# Patient Record
Sex: Female | Born: 1935
Health system: Southern US, Community
[De-identification: ages and names within clinical notes are randomized; demographics above are authoritative.]

## PROBLEM LIST (undated history)

## (undated) DIAGNOSIS — J189 Pneumonia, unspecified organism: Secondary | ICD-10-CM

## (undated) DIAGNOSIS — C8404 Mycosis fungoides, lymph nodes of axilla and upper limb: Principal | ICD-10-CM

## (undated) DIAGNOSIS — I639 Cerebral infarction, unspecified: Secondary | ICD-10-CM

## (undated) DIAGNOSIS — F411 Generalized anxiety disorder: Secondary | ICD-10-CM

## (undated) DIAGNOSIS — L409 Psoriasis, unspecified: Secondary | ICD-10-CM

## (undated) DIAGNOSIS — S78119A Complete traumatic amputation at level between unspecified hip and knee, initial encounter: Secondary | ICD-10-CM

## (undated) DIAGNOSIS — K5730 Diverticulosis of large intestine without perforation or abscess without bleeding: Secondary | ICD-10-CM

## (undated) DIAGNOSIS — I2510 Atherosclerotic heart disease of native coronary artery without angina pectoris: Secondary | ICD-10-CM

## (undated) DIAGNOSIS — R5381 Other malaise: Secondary | ICD-10-CM

## (undated) DIAGNOSIS — G609 Hereditary and idiopathic neuropathy, unspecified: Secondary | ICD-10-CM

## (undated) DIAGNOSIS — I219 Acute myocardial infarction, unspecified: Secondary | ICD-10-CM

## (undated) DIAGNOSIS — R55 Syncope and collapse: Secondary | ICD-10-CM

## (undated) DIAGNOSIS — C8448 Peripheral T-cell lymphoma, not classified, lymph nodes of multiple sites: Principal | ICD-10-CM

## (undated) DIAGNOSIS — C4491 Basal cell carcinoma of skin, unspecified: Secondary | ICD-10-CM

## (undated) DIAGNOSIS — C84A0 Cutaneous T-cell lymphoma, unspecified, unspecified site: Secondary | ICD-10-CM

## (undated) DIAGNOSIS — R0602 Shortness of breath: Secondary | ICD-10-CM

## (undated) DIAGNOSIS — I739 Peripheral vascular disease, unspecified: Secondary | ICD-10-CM

## (undated) DIAGNOSIS — E871 Hypo-osmolality and hyponatremia: Secondary | ICD-10-CM

## (undated) DIAGNOSIS — S81801A Unspecified open wound, right lower leg, initial encounter: Secondary | ICD-10-CM

## (undated) DIAGNOSIS — I6529 Occlusion and stenosis of unspecified carotid artery: Secondary | ICD-10-CM

## (undated) DIAGNOSIS — M7120 Synovial cyst of popliteal space [Baker], unspecified knee: Secondary | ICD-10-CM

## (undated) DIAGNOSIS — G459 Transient cerebral ischemic attack, unspecified: Secondary | ICD-10-CM

## (undated) DIAGNOSIS — Z8673 Personal history of transient ischemic attack (TIA), and cerebral infarction without residual deficits: Secondary | ICD-10-CM

## (undated) DIAGNOSIS — I509 Heart failure, unspecified: Secondary | ICD-10-CM

## (undated) DIAGNOSIS — Z89619 Acquired absence of unspecified leg above knee: Secondary | ICD-10-CM

## (undated) DIAGNOSIS — M25579 Pain in unspecified ankle and joints of unspecified foot: Secondary | ICD-10-CM

## (undated) DIAGNOSIS — I87339 Chronic venous hypertension (idiopathic) with ulcer and inflammation of unspecified lower extremity: Secondary | ICD-10-CM

## (undated) DIAGNOSIS — L97909 Non-pressure chronic ulcer of unspecified part of unspecified lower leg with unspecified severity: Secondary | ICD-10-CM

## (undated) DIAGNOSIS — K210 Gastro-esophageal reflux disease with esophagitis: Secondary | ICD-10-CM

## (undated) DIAGNOSIS — E876 Hypokalemia: Secondary | ICD-10-CM

## (undated) DIAGNOSIS — D7289 Other specified disorders of white blood cells: Secondary | ICD-10-CM

## (undated) DIAGNOSIS — I480 Paroxysmal atrial fibrillation: Secondary | ICD-10-CM

## (undated) DIAGNOSIS — E785 Hyperlipidemia, unspecified: Secondary | ICD-10-CM

## (undated) DIAGNOSIS — C8409 Mycosis fungoides, extranodal and solid organ sites: Secondary | ICD-10-CM

## (undated) DIAGNOSIS — I10 Essential (primary) hypertension: Secondary | ICD-10-CM

## (undated) DIAGNOSIS — D649 Anemia, unspecified: Secondary | ICD-10-CM

## (undated) DIAGNOSIS — I4891 Unspecified atrial fibrillation: Secondary | ICD-10-CM

## (undated) DIAGNOSIS — S42292A Other displaced fracture of upper end of left humerus, initial encounter for closed fracture: Secondary | ICD-10-CM

## (undated) DIAGNOSIS — K5900 Constipation, unspecified: Secondary | ICD-10-CM

## (undated) DIAGNOSIS — M2550 Pain in unspecified joint: Secondary | ICD-10-CM

## (undated) DIAGNOSIS — Z8719 Personal history of other diseases of the digestive system: Secondary | ICD-10-CM

## (undated) HISTORY — DX: Pneumonia, unspecified organism: J18.9

## (undated) HISTORY — DX: Peripheral t-cell lymphoma, not classified, lymph nodes of multiple sites: C84.48

## (undated) HISTORY — DX: Syncope and collapse: R55

## (undated) HISTORY — DX: Occlusion and stenosis of unspecified carotid artery: I65.29

## (undated) HISTORY — DX: Anemia, unspecified: D64.9

## (undated) HISTORY — PX: DILATION AND CURETTAGE OF UTERUS: SHX78

## (undated) HISTORY — DX: Peripheral vascular disease, unspecified: I73.9

## (undated) HISTORY — DX: Atherosclerotic heart disease of native coronary artery without angina pectoris: I25.10

## (undated) HISTORY — DX: Pain in unspecified ankle and joints of unspecified foot: M25.579

## (undated) HISTORY — PX: TONSILLECTOMY: SUR1361

## (undated) HISTORY — DX: Paroxysmal atrial fibrillation: I48.0

## (undated) HISTORY — PX: OTHER SURGICAL HISTORY: SHX169

## (undated) HISTORY — DX: Unspecified open wound, right lower leg, initial encounter: S81.801A

## (undated) HISTORY — DX: Complete traumatic amputation at level between unspecified hip and knee, initial encounter: S78.119A

## (undated) HISTORY — DX: Hypokalemia: E87.6

## (undated) HISTORY — DX: Pain in unspecified joint: M25.50

## (undated) HISTORY — DX: Acquired absence of unspecified leg above knee: Z89.619

## (undated) HISTORY — DX: Hereditary and idiopathic neuropathy, unspecified: G60.9

## (undated) HISTORY — DX: Non-pressure chronic ulcer of unspecified part of unspecified lower leg with unspecified severity: L97.909

## (undated) HISTORY — DX: Other specified disorders of white blood cells: D72.89

## (undated) HISTORY — DX: Mycosis fungoides, extranodal and solid organ sites: C84.09

## (undated) HISTORY — DX: Mycosis fungoides, lymph nodes of axilla and upper limb: C84.04

## (undated) HISTORY — DX: Transient cerebral ischemic attack, unspecified: G45.9

## (undated) HISTORY — DX: Other malaise: R53.81

## (undated) HISTORY — DX: Diverticulosis of large intestine without perforation or abscess without bleeding: K57.30

## (undated) HISTORY — DX: Generalized anxiety disorder: F41.1

## (undated) HISTORY — PX: EYE SURGERY: SHX253

## (undated) HISTORY — DX: Constipation, unspecified: K59.00

## (undated) HISTORY — DX: Chronic venous hypertension (idiopathic) with ulcer and inflammation of unspecified lower extremity: I87.339

## (undated) HISTORY — DX: Gastro-esophageal reflux disease with esophagitis: K21.0

## (undated) HISTORY — DX: Hypo-osmolality and hyponatremia: E87.1

## (undated) HISTORY — PX: VERTEBROPLASTY: SHX113

## (undated) HISTORY — DX: Synovial cyst of popliteal space (Baker), unspecified knee: M71.20

---

## 1998-01-31 ENCOUNTER — Ambulatory Visit (HOSPITAL_COMMUNITY): Admission: RE | Admit: 1998-01-31 | Discharge: 1998-01-31 | Payer: Self-pay | Admitting: Internal Medicine

## 2000-11-02 ENCOUNTER — Ambulatory Visit (HOSPITAL_COMMUNITY): Admission: RE | Admit: 2000-11-02 | Discharge: 2000-11-02 | Payer: Self-pay | Admitting: *Deleted

## 2000-11-02 ENCOUNTER — Encounter: Payer: Self-pay | Admitting: *Deleted

## 2001-09-14 ENCOUNTER — Emergency Department (HOSPITAL_COMMUNITY): Admission: EM | Admit: 2001-09-14 | Discharge: 2001-09-14 | Payer: Self-pay | Admitting: Emergency Medicine

## 2002-01-24 ENCOUNTER — Ambulatory Visit (HOSPITAL_COMMUNITY): Admission: RE | Admit: 2002-01-24 | Discharge: 2002-01-24 | Payer: Self-pay | Admitting: *Deleted

## 2003-01-18 ENCOUNTER — Ambulatory Visit (HOSPITAL_COMMUNITY): Admission: RE | Admit: 2003-01-18 | Discharge: 2003-01-18 | Payer: Self-pay | Admitting: *Deleted

## 2005-06-15 ENCOUNTER — Inpatient Hospital Stay (HOSPITAL_COMMUNITY): Admission: EM | Admit: 2005-06-15 | Discharge: 2005-06-18 | Payer: Self-pay | Admitting: Emergency Medicine

## 2005-06-16 ENCOUNTER — Encounter: Payer: Self-pay | Admitting: Cardiology

## 2005-06-16 ENCOUNTER — Ambulatory Visit: Payer: Self-pay | Admitting: Cardiology

## 2005-06-18 ENCOUNTER — Encounter (INDEPENDENT_AMBULATORY_CARE_PROVIDER_SITE_OTHER): Payer: Self-pay | Admitting: Cardiology

## 2006-12-09 ENCOUNTER — Inpatient Hospital Stay (HOSPITAL_COMMUNITY): Admission: EM | Admit: 2006-12-09 | Discharge: 2006-12-13 | Payer: Self-pay | Admitting: Emergency Medicine

## 2006-12-11 ENCOUNTER — Encounter (INDEPENDENT_AMBULATORY_CARE_PROVIDER_SITE_OTHER): Payer: Self-pay | Admitting: Cardiology

## 2006-12-17 ENCOUNTER — Emergency Department (HOSPITAL_COMMUNITY): Admission: EM | Admit: 2006-12-17 | Discharge: 2006-12-17 | Payer: Self-pay | Admitting: Emergency Medicine

## 2007-05-24 ENCOUNTER — Encounter: Admission: RE | Admit: 2007-05-24 | Discharge: 2007-06-21 | Payer: Self-pay | Admitting: Internal Medicine

## 2007-12-20 ENCOUNTER — Encounter (INDEPENDENT_AMBULATORY_CARE_PROVIDER_SITE_OTHER): Payer: Self-pay | Admitting: *Deleted

## 2007-12-20 ENCOUNTER — Inpatient Hospital Stay (HOSPITAL_COMMUNITY): Admission: EM | Admit: 2007-12-20 | Discharge: 2007-12-22 | Payer: Self-pay | Admitting: Emergency Medicine

## 2007-12-20 ENCOUNTER — Ambulatory Visit: Payer: Self-pay | Admitting: Vascular Surgery

## 2007-12-21 ENCOUNTER — Encounter (INDEPENDENT_AMBULATORY_CARE_PROVIDER_SITE_OTHER): Payer: Self-pay | Admitting: *Deleted

## 2008-04-09 ENCOUNTER — Encounter: Admission: RE | Admit: 2008-04-09 | Discharge: 2008-06-05 | Payer: Self-pay | Admitting: Internal Medicine

## 2008-06-28 ENCOUNTER — Encounter: Admission: RE | Admit: 2008-06-28 | Discharge: 2008-06-28 | Payer: Self-pay | Admitting: Internal Medicine

## 2008-07-11 ENCOUNTER — Inpatient Hospital Stay (HOSPITAL_COMMUNITY): Admission: RE | Admit: 2008-07-11 | Discharge: 2008-07-12 | Payer: Self-pay | Admitting: Neurological Surgery

## 2008-07-11 ENCOUNTER — Encounter (INDEPENDENT_AMBULATORY_CARE_PROVIDER_SITE_OTHER): Payer: Self-pay | Admitting: Neurological Surgery

## 2008-09-18 ENCOUNTER — Ambulatory Visit: Payer: Self-pay | Admitting: Vascular Surgery

## 2009-11-14 ENCOUNTER — Emergency Department (HOSPITAL_COMMUNITY)
Admission: EM | Admit: 2009-11-14 | Discharge: 2009-11-15 | Payer: Self-pay | Source: Home / Self Care | Admitting: Emergency Medicine

## 2009-11-27 ENCOUNTER — Inpatient Hospital Stay (HOSPITAL_COMMUNITY): Admission: EM | Admit: 2009-11-27 | Discharge: 2009-11-28 | Payer: Self-pay | Admitting: Emergency Medicine

## 2009-11-28 ENCOUNTER — Encounter (INDEPENDENT_AMBULATORY_CARE_PROVIDER_SITE_OTHER): Payer: Self-pay | Admitting: Cardiovascular Disease

## 2009-11-28 ENCOUNTER — Ambulatory Visit: Payer: Self-pay | Admitting: Vascular Surgery

## 2010-01-24 ENCOUNTER — Emergency Department (HOSPITAL_COMMUNITY): Admission: EM | Admit: 2010-01-24 | Discharge: 2010-01-24 | Payer: Self-pay | Admitting: Family Medicine

## 2010-01-24 ENCOUNTER — Ambulatory Visit: Payer: Self-pay | Admitting: Surgery

## 2010-01-24 ENCOUNTER — Ambulatory Visit
Admission: RE | Admit: 2010-01-24 | Discharge: 2010-01-24 | Payer: Self-pay | Source: Home / Self Care | Admitting: Emergency Medicine

## 2010-04-15 ENCOUNTER — Emergency Department (HOSPITAL_COMMUNITY): Admission: EM | Admit: 2010-04-15 | Discharge: 2010-04-15 | Payer: Self-pay | Admitting: Family Medicine

## 2010-10-20 ENCOUNTER — Other Ambulatory Visit: Payer: Self-pay | Admitting: Family Medicine

## 2010-10-20 ENCOUNTER — Ambulatory Visit
Admission: RE | Admit: 2010-10-20 | Discharge: 2010-10-20 | Disposition: A | Discharge status: Home / Self Care | Payer: Self-pay | Source: Ambulatory Visit | Attending: Family Medicine | Admitting: Family Medicine

## 2010-10-20 DIAGNOSIS — Z139 Encounter for screening, unspecified: Secondary | ICD-10-CM

## 2010-11-06 ENCOUNTER — Inpatient Hospital Stay (HOSPITAL_COMMUNITY)
Admission: AD | Admit: 2010-11-06 | Discharge: 2010-11-11 | DRG: 281 | Disposition: A | Discharge status: Nursing Home (Includes ICF) | Payer: Medicare Other | Source: Ambulatory Visit | Attending: Cardiovascular Disease | Admitting: Cardiovascular Disease

## 2010-11-06 ENCOUNTER — Inpatient Hospital Stay (HOSPITAL_COMMUNITY): Payer: Medicare Other

## 2010-11-06 DIAGNOSIS — I10 Essential (primary) hypertension: Secondary | ICD-10-CM | POA: Diagnosis present

## 2010-11-06 DIAGNOSIS — Z8673 Personal history of transient ischemic attack (TIA), and cerebral infarction without residual deficits: Secondary | ICD-10-CM

## 2010-11-06 DIAGNOSIS — I6529 Occlusion and stenosis of unspecified carotid artery: Secondary | ICD-10-CM | POA: Diagnosis present

## 2010-11-06 DIAGNOSIS — I359 Nonrheumatic aortic valve disorder, unspecified: Secondary | ICD-10-CM | POA: Diagnosis present

## 2010-11-06 DIAGNOSIS — E785 Hyperlipidemia, unspecified: Secondary | ICD-10-CM | POA: Diagnosis present

## 2010-11-06 DIAGNOSIS — D72829 Elevated white blood cell count, unspecified: Secondary | ICD-10-CM | POA: Diagnosis present

## 2010-11-06 DIAGNOSIS — L408 Other psoriasis: Secondary | ICD-10-CM | POA: Diagnosis present

## 2010-11-06 DIAGNOSIS — I5032 Chronic diastolic (congestive) heart failure: Secondary | ICD-10-CM | POA: Diagnosis present

## 2010-11-06 DIAGNOSIS — I214 Non-ST elevation (NSTEMI) myocardial infarction: Principal | ICD-10-CM | POA: Diagnosis present

## 2010-11-06 DIAGNOSIS — Z7982 Long term (current) use of aspirin: Secondary | ICD-10-CM

## 2010-11-06 DIAGNOSIS — I509 Heart failure, unspecified: Secondary | ICD-10-CM | POA: Diagnosis present

## 2010-11-06 DIAGNOSIS — I2510 Atherosclerotic heart disease of native coronary artery without angina pectoris: Secondary | ICD-10-CM | POA: Diagnosis present

## 2010-11-06 DIAGNOSIS — I4891 Unspecified atrial fibrillation: Secondary | ICD-10-CM | POA: Diagnosis present

## 2010-11-06 HISTORY — PX: CARDIAC CATHETERIZATION: SHX172

## 2010-11-06 LAB — COMPREHENSIVE METABOLIC PANEL
ALT: 28 U/L (ref 0–35)
AST: 30 U/L (ref 0–37)
CO2: 32 mEq/L (ref 19–32)
GFR calc Af Amer: >60 mL/min (ref >60)
Potassium: 3.8 mEq/L (ref 3.5–5.1)
Total Bilirubin: 0.4 mg/dL (ref 0.3–1.2)

## 2010-11-06 LAB — PROTIME-INR
INR: 1.03 (ref 0.00–1.49)
Prothrombin Time: 13.7 seconds (ref 11.6–15.2)

## 2010-11-06 LAB — CBC
HCT: 37.7 % (ref 36.0–46.0)
RDW: 12.9 % (ref 11.5–15.5)
WBC: 18.1 10*3/uL — ABNORMAL HIGH (ref 4.0–10.5)

## 2010-11-06 LAB — CARDIAC PANEL(CRET KIN+CKTOT+MB+TROPI)
Total CK: 127 U/L (ref 7–177)
Troponin I: 0.14 ng/mL — ABNORMAL HIGH (ref 0.00–0.06)

## 2010-11-06 LAB — APTT: aPTT: 26 seconds (ref 24–37)

## 2010-11-07 LAB — CBC
HCT: 34 % — ABNORMAL LOW (ref 36.0–46.0)
Hemoglobin: 11.5 g/dL — ABNORMAL LOW (ref 12.0–15.0)
RBC: 3.64 MIL/uL — ABNORMAL LOW (ref 3.87–5.11)
WBC: 18.7 10*3/uL — ABNORMAL HIGH (ref 4.0–10.5)

## 2010-11-07 LAB — HEPARIN LEVEL (UNFRACTIONATED): Heparin Unfractionated: 0.17 IU/mL — ABNORMAL LOW (ref 0.30–0.70)

## 2010-11-07 LAB — LIPID PANEL
Total CHOL/HDL Ratio: 3.9 RATIO
VLDL: 18 mg/dL (ref 0–40)

## 2010-11-07 LAB — BASIC METABOLIC PANEL
GFR calc Af Amer: >60 mL/min (ref >60)
GFR calc non Af Amer: 50 mL/min — ABNORMAL LOW (ref >60)
Potassium: 3.8 mEq/L (ref 3.5–5.1)
Sodium: 137 mEq/L (ref 135–145)

## 2010-11-07 LAB — CARDIAC PANEL(CRET KIN+CKTOT+MB+TROPI)
CK, MB: 4.7 ng/mL — ABNORMAL HIGH (ref 0.3–4.0)
CK, MB: 3.4 ng/mL (ref 0.3–4.0)
Total CK: 114 U/L (ref 7–177)
Total CK: 91 U/L (ref 7–177)
Troponin I: 0.31 ng/mL — ABNORMAL HIGH (ref 0.00–0.06)

## 2010-11-07 LAB — BRAIN NATRIURETIC PEPTIDE: Pro B Natriuretic peptide (BNP): 522 pg/mL — ABNORMAL HIGH (ref 0.0–100.0)

## 2010-11-08 LAB — CBC
Hemoglobin: 11.8 g/dL — ABNORMAL LOW (ref 12.0–15.0)
MCH: 30.4 pg (ref 26.0–34.0)
MCHC: 32.6 g/dL (ref 30.0–36.0)
MCV: 93.3 fL (ref 78.0–100.0)
Platelets: 316 10*3/uL (ref 150–400)

## 2010-11-08 LAB — HEPARIN LEVEL (UNFRACTIONATED): Heparin Unfractionated: 0.52 IU/mL (ref 0.30–0.70)

## 2010-11-09 LAB — CBC
Hemoglobin: 12.6 g/dL (ref 12.0–15.0)
MCH: 31.2 pg (ref 26.0–34.0)
MCHC: 33.5 g/dL (ref 30.0–36.0)
MCV: 93.1 fL (ref 78.0–100.0)

## 2010-11-09 LAB — HEPARIN LEVEL (UNFRACTIONATED): Heparin Unfractionated: 0.27 IU/mL — ABNORMAL LOW (ref 0.30–0.70)

## 2010-11-10 DIAGNOSIS — I2510 Atherosclerotic heart disease of native coronary artery without angina pectoris: Secondary | ICD-10-CM

## 2010-11-10 LAB — BASIC METABOLIC PANEL
BUN: 13 mg/dL (ref 6–23)
CO2: 27 mEq/L (ref 19–32)
Calcium: 9.2 mg/dL (ref 8.4–10.5)
Creatinine, Ser: 0.79 mg/dL (ref 0.4–1.2)
GFR calc non Af Amer: >60 mL/min (ref >60)
Glucose, Bld: 88 mg/dL (ref 70–99)
Sodium: 141 mEq/L (ref 135–145)

## 2010-11-10 LAB — CBC
HCT: 37.4 % (ref 36.0–46.0)
MCH: 30.8 pg (ref 26.0–34.0)
MCH: 31.5 pg (ref 26.0–34.0)
MCHC: 33.2 g/dL (ref 30.0–36.0)
MCHC: 33.4 g/dL (ref 30.0–36.0)
MCV: 92.8 fL (ref 78.0–100.0)
MCV: 94.4 fL (ref 78.0–100.0)
Platelets: 323 10*3/uL (ref 150–400)
RDW: 12.7 % (ref 11.5–15.5)
RDW: 12.7 % (ref 11.5–15.5)

## 2010-11-10 LAB — HEPARIN LEVEL (UNFRACTIONATED): Heparin Unfractionated: 0.84 IU/mL — ABNORMAL HIGH (ref 0.30–0.70)

## 2010-11-11 LAB — CBC
HCT: 37.5 % (ref 36.0–46.0)
Hemoglobin: 12.3 g/dL (ref 12.0–15.0)
MCHC: 32.8 g/dL (ref 30.0–36.0)
MCV: 93.3 fL (ref 78.0–100.0)
RDW: 12.9 % (ref 11.5–15.5)

## 2010-11-13 NOTE — Op Note (Signed)
NAME:  Sara [name], Sara [name]                  ACCOUNT NO.:  [id number]  MEDICAL RECORD NO.:  [id number]           PATIENT TYPE:  I  LOCATION:  2010                         FACILITY:  [facility]  PHYSICIAN:  Thurmon Fair, MD     DATE OF BIRTH:  [birth year]  DATE OF PROCEDURE: DATE OF DISCHARGE:                              OPERATIVE REPORT   PROCEDURES PERFORMED: 1. Left heart catheterization. 2. Selective coronary angiography. 3. Left ventriculography.  REASON FOR PROCEDURE:  Unstable angina/small non-ST-segment elevation myocardial infarction.  After the risks and benefits of the procedure described, the patient provided informed consent, was brought to the cardiac cath lab in the fasting state.  Local anesthesia 1% lidocaine was administered to the right groin area and a 5-French right common femoral arterial sheath was introduced using the modified Seldinger technique with little difficulty.  Using 5-French JL-4 and JR catheters, the right and left coronary arteries were respectively cannulated and multiple selective angiograms were performed.  The JR catheter was also used to cross the aortic valve and perform a hand injection and left ventriculogram.  At the end of procedure, the catheters were removed and the sheath will be removed using manual pressure.  No immediate complications occurred.  FINDINGS: 1. The ascending aorta, aortic arch, and descending thoracic aorta     show extensive atherosclerosis and calcification. 2. The left coronary artery is heavily calcified but free of stenoses. 3. The left anterior descending coronary artery is heavily calcified     throughout its proximal two thirds and has diffuse disease.  There     is approximately 50% proximal lesion.  There is a very long segment     of at least 20 mL of luminal irregularities and severe irregular     plaque, severe heterogeneous plaque with a maximum severity of     about 80%.  This is located just beyond the  takeoff of the only     significant diagonal artery, which is a fairly small vessel.  The     ostial diagonal shows approximately 80% stenosis.  The distal LAD     artery is again diffusely diseased but it is a very long vessel     that wraps around the apex. 4. The left circumflex coronary artery is a very large albeit     nondominant vessel.  It has extensive calcification and severe     diffuse ulceration.  It gives off 2 major oblique marginal artery     and then ends with a small posterolateral ventricular artery.  The     first oblique marginal artery shows an approximately 90% to 95%     ostial stenosis.  The second oblique marginal artery which is     slightly larger has a long area of irregularity with a maximum     diameter stenosis of about 70%.  The distal AV groove vessel beyond     the OM2 have an approximately 50-60% narrowing. 5. The right coronary artery is a dominant but small-to-medium size     vessel.  There is 60-70% ostial and proximal vessel stenosis  and an     80% discrete mid vessel stenosis. 6. The left ventricle shows left ventricular hypertrophy and     hyperdynamic ejection fraction of greater than 70% without any     regional wall motion abnormalities.  The end-diastolic pressure is     26 mmHg.  There is mild aortic stenosis with peak gradient of about     15 mmHg.  There is heavy calcification in the mitral annulus.  CONCLUSION:  [name] has severe three-vessel coronary artery disease and preserved left ventricular systolic function.  I believe she would be served with coronary bypass surgery due to the extensive and diffuse nature of her coronary problem.  However, her age and the extensive atherosclerosis of the ascending aorta and aortic arch make her less than optimal candidate for bypass surgery.  Thoracic surgery consultation will be obtained but consideration might be given to incomplete revascularization with percutaneous techniques.  The  most important lesions appeared to be the severe ostial stenosis of the OM1 vessel, but also the long segment disease in the LAD artery.  The latter would be hard to tackle with a percutaneous intervention due to the extensive calcification of the vessel.  However, it seems to best fit the reported area of ischemia described in a previous stress test.     Thurmon Fair, MD     MC/MEDQ  D:  11/09/2010  T:  11/10/2010  Job:  [id number]  cc:   [facility] Cardioth[facility]acic Surgery  Electronically Signed by Thurmon Fair M.D. on 11/13/2010 12:51:38 PM

## 2010-11-19 NOTE — Consult Note (Signed)
NAME:  Sara [name], Narula                  ACCOUNT NO.:  [id number]  MEDICAL RECORD NO.:  [id number]           PATIENT TYPE:  I  LOCATION:  2010                         FACILITY:  [facility]  PHYSICIAN:  Evelene Croon, M.D.     DATE OF BIRTH:  Aug 12, 1919  DATE OF CONSULTATION:  11/10/2010 DATE OF DISCHARGE:                                CONSULTATION   REFERRING PHYSICIAN:  Nanetta Batty, MD  REASON FOR CONSULTATION:  Severe multivessel coronary artery disease.  CLINICAL HISTORY:  I was asked by Dr. Allyson Sabal to evaluate [name] for consideration of coronary artery bypass graft surgery.  She is an 77- year-old woman with a history of hypertension, hyperlipidemia, history of severe debilitating psoriasis as well as a history of prior stroke and known high-grade left internal carotid artery stenosis who was admitted with new onset substernal chest discomfort that awoke her from sleep about 2 a.m.  She had positive troponin of 0.14 on admission with a CPK of 127 and MB of 5.5.  Her troponin increased to 0.37.  Her second CPK-MB was 4.7.  She had a known history of coronary artery disease and had a positive nuclear scan apparently in 2010.  She was treated medically.  She underwent cardiac catheterization on November 09, 2010, which showed three-vessel coronary artery disease.  The LAD was heavily calcified throughout the proximal two thirds with diffuse disease. There was about 50% proximal stenosis.  There was a long segment stenosis with about 80% maximum stenosis within this region.  This was located just beyond the takeoff of the diagonal artery.  The ostium of the diagonal branch had about 80% stenosis.  This was relatively small vessel.  The distal LAD was diffusely diseased, but wrapped around the apex.  Left circumflex was large, nondominant vessel that was also calcified and severely diseased.  I gave off two major obtuse marginal branches and ended with a small posterolateral branch.  The  first obtuse marginal had about 90-95% ostial stenosis.  The second marginal had about 70% stenosis.  The right coronary artery was a dominant vessel. It had 60-70% ostial and proximal stenosis and 80% midvessel stenosis. Left ventricular ejection fraction was about 70% with no regional wall motion abnormalities.  There was mild aortic stenosis with a peak gradient of about 15 mmHg.  There was heavy mitral annular calcification, but no significant mitral regurgitation.  REVIEW OF SYSTEMS:  Her review of systems is as follows:  GENERAL:  She denies any fever or chills.  She denies any recent weight changes.  She does report some fatigue.  EYES:  Negative.  ENT:  Negative.  ENDOCRINE: She denies diabetes and hypothyroidism.  CARDIOVASCULAR:  As above.  She denies exertional dyspnea.  She has had no PND or orthopnea.  She denies peripheral edema and palpitations.  She does have a history of paroxysmal atrial fibrillation dating back to 2008, but has not been on anticoagulation.  RESPIRATORY:  She denies cough and sputum production. GI:  She has had no nausea or vomiting.  She denies melena and bright red blood per rectum.  GU:  She denies dysuria and hematuria. MUSCULOSKELETAL:  She does have history of compression fractures in the back secondary to osteoporosis.  VASCULAR:  She denies claudication and phlebitis.  She does have a history of a high-grade left internal carotid artery stenosis and has had previous stroke x2.  She denies any focal weakness or numbness.  SKIN:  She has history of psoriasis for several years and having manage on methotrexate and anti-inflammatory with fair control, but these were recently stopped.  So that she could start on a study drug and she has had an acute flare for psoriasis that is her main complaint at this time.  PSYCHIATRIC:  Negative. HEMATOLOGICAL:  Negative.  ALLERGIES:  She is allergic to PENICILLIN, EVISTA, FOSAMAX, TRIAMCINOLONE, and  TRIAMTERENE.  PAST MEDICAL HISTORY:  Significant for: 1. Hypertension. 2. Hyperlipidemia. 3. History of left internal carotid artery stenosis followed by     vascular surgeons.  Surgery was recommended in 2010, but she     declined. 4. She has a history of a right occipital stroke in 2006 and right     thalamic infarct in 2009. 5. She has a history of paroxysmal atrial fibrillation. 6. She has a history of diastolic congestive heart failure. 7. She is status post tonsillectomy and status post D and C.  Her medications are as noted on her medicine reconciliation form.  SOCIAL HISTORY:  She is widowed and has two children.  Her son, Conni Elliot is a Solicitor.  She does not smoke or drink alcohol.  FAMILY HISTORY:  Negative.  PHYSICAL EXAMINATION:  VITAL SIGNS:  She is afebrile.  Blood pressure 176/63, pulse 72 and regular, respiratory rate is 18 and unlabored. Oxygen saturation on room air is 94%. GENERAL:  She is a well-developed elderly white female in no distress who is scratching her extremities due to psoriasis. HEENT:  Normocephalic and atraumatic.  Pupils are equal and reactive to light.  Extraocular muscles are intact.  Oropharynx is clear. NECK:  Normal carotid pulses bilaterally.  There are bilateral cervical bruits.  There is no adenopathy or thyromegaly. LUNGS:  Clear. CARDIAC:  Regular rate and rhythm with normal S1 and S2.  There is a grade 1-2/6 systolic murmur over the aorta. ABDOMEN:  Active bowel sounds.  Abdomen is soft and nontender.  There are no palpable masses or organomegaly. EXTREMITIES:  No peripheral edema.  Pedal pulses are palpable bilaterally. SKIN:  Diffuse erythema with a scaling rash all over her body.  This is not appeared to spare any area. NEUROLOGIC:  Alert and oriented x3.  Motor and sensory exams are grossly norma.  LABORATORY EXAMINATION:  White blood cell count is 20.7, hemoglobin 13.5, hematocrit is 40.5, platelet count 323,000.   Electrolytes are normal with BUN of 13, creatinine of 0.79.  Her BNP was elevated at 522. Liver function profile is within normal limits with an albumin of 3.7. Chest x-ray shows chronic mild cardiomegaly and extensive atherosclerosis particularly at the aortic arch.  There is pulmonary hyperinflation.  IMPRESSION:  Ms. Swickard has severe three-vessel coronary artery disease with unstable anginal symptoms.  She presented with a small non-ST segment elevation myocardial infarction.  Her risk for coronary artery bypass graft surgery is high given her age of 40 with a calcified aorta as well as a history of prior strokes and high-grade left internal carotid artery stenosis.  She is also at increased risk for nonhealing wounds and wound infection given her diffuse flare-up of psoriasis, which involves her entire chest  as well as rest to her body.  Her white blood cell count is already elevated to 20.7.  It is not clear to me if she has received any medications such as steroids that may elevate that. She has had no fever as far as I can see.  Given all of the above, I would recommend percutaneous intervention if that is an option.  If that is not an option, then I would consider performing high-risk coronary artery bypass graft surgery.  I do not think we can expect to perform her surgery off-pump given her diffuse disease and calcified vessels which would make it very difficult to do.  I discussed all of this with her and told her that I would be happy discussed with her son- in-law who is a physician.  I will discuss the case further with Dr. Allyson Sabal to decide whether percutaneous intervention is even an option.     Evelene Croon, M.D.     BB/MEDQ  D:  11/10/2010  T:  11/11/2010  Job:  [id number]  cc:   Nanetta Batty, M.D.  Electronically Signed by Evelene Croon M.D. on 11/18/2010 11:31:40 AM

## 2010-11-25 NOTE — Discharge Summary (Signed)
NAME:  Sara [name], Sara [name]                  ACCOUNT NO.:  [id number]  MEDICAL RECORD NO.:  [id number]           PATIENT TYPE:  I  LOCATION:  2010                         FACILITY:  [facility]  PHYSICIAN:  Nanetta Batty, M.D.   DATE OF BIRTH:  1919/04/29  DATE OF ADMISSION:  11/06/2010 DATE OF DISCHARGE:  11/11/2010                              DISCHARGE SUMMARY   DISCHARGE DIAGNOSES: 1. Status post left heart catheterization on November 09, 2010.  The     patient has severe three-vessel coronary artery disease with     preserved left ventricular function.  She is high risk for coronary     artery bypass grafting. 2. Psoriasis.  The patient takes a study drug, she is due for her     third dose this week. 3. Paroxysmal atrial fibrillation, now in sinus rhythm. 4. Leukocytosis. 5. Grade 2 diastolic dysfunction. 6. Hypertension.  HOSPITAL COURSE:  [name] is an 77 year old Caucasian female with history of paroxysmal atrial fibrillation, hypertension, hyperlipidemia, high-grade left internal carotid artery stenosis by duplex, and psoriasis, currently taking a study medication.  She woke at 2 a.m. with chest pain the day of admission, was found to be in atrial fibrillation as well with a controlled ventricular rate.  She had a Myoview stress test in March 2011, which showed mild-to-moderate anterior ischemia and at that time medical management was recommended.  Upon arrival to [facility], the patient was found to be in normal sinus rhythm, she was started on IV heparin, admits to telemetry scheduled for cardiac catheterization on Monday, November 09, 2010.  Her cardiac enzymes were elevated, peak troponin was 0.37, peak CK-MB was 5.5 and she had a BNP of 522.  The patient continued without chest pain or shortness of breath.  Her cardiac catheterization on November 09, 2010, revealed severe three-vessel coronary artery disease with a preserved left ventricular systolic function.  A CTS Surgery  consult was requested due to severely calcified aortic arch, calcified left main as well as psoriasis, so that the patient is high risk for coronary artery bypass grafting.  Peak blood pressure was elevated at 176/63 on November 10, 2010.  She was subsequently started on BiDil 37.5 mg twice daily. Subsequent blood pressure was checked on November 11, 2010 was 116/46. The patient continued without chest pain or shortness of breath, though she was complaining of itchiness for psoriasis.  At this time, the patient has been seen by Dr. Allyson Sabal on February 29, 2011, feels the patient is stable for discharge.  DISCHARGE LABORATORY DATA:  WBCs 17.8, hemoglobin 12.3, hematocrit 37.5, platelets 297.  Sodium 141, potassium 4.0, chloride 105, carbon dioxide 27, glucose 88, BUN 13, creatinine 0.79, calcium 9.2.  Total cholesterol was 143, triglycerides 91, HDL 39, LDL 96.  STUDIES/PROCEDURES: 1. Chest x-ray on November 06, 2010.  Impression:  Pulmonary     hyperinflation, chronic mild cardiomegaly and extensive     atherosclerosis.  No acute cardiopulmonary abnormality. 2. Cardiac catheterization on November 09, 2010.  Conclusion:  Ms.     Burr has severe three-vessel coronary artery disease and preserved  left ventricular systolic function.  I believe she would be best     served by coronary artery bypass surgery due to extensive and     diffuse nature of her coronary problems.  However, her age and     extensive atherosclerosis of the descending aorta and aortic arch     makes her less than optimal candidate for bypass surgery.  Thoracic     Surgery consult was obtained.  The most important lesion appeared     to be the severe ostial stenosis of the OM1 vessel, also long     segment disease in the LAD artery.  In latter, it will be hard to     tack over the percutaneous intervention due to the extensive     calcification of the vessel.  However, seems to best fit in the     reported area of  ischemia described in previous stress test.  DISCHARGE MEDICATIONS: 1. Acetaminophen 325 mg two tablets by mouth every 4 hours as needed     for pain. 2. Aspirin enteric-coated 325 mg one tablet by mouth daily. 3. BiDil 20/37.5 mg one tablet by mouth twice daily. 4. Nitroglycerin sublingual 0.4 mg tablets one tablet under the tongue     every 5 minutes for chest pain up to three doses total. 5. Amlodipine 10 mg one tablet by mouth daily. 6. Calcium over-the-counter one tablet by mouth daily. 7. Carvedilol 6.25 mg one tablet by mouth twice daily. 8. Coenzymes Q10 vitamin two tablets by mouth daily. 9. Furosemide 40 mg one tablet by mouth daily. 10.Fish oil over-the-counter two capsules by mouth daily at bedtime. 11.Lisinopril 40 mg one tablet by mouth daily. 12.Metolazone 2.5 mg one tablet by mouth every other day. 13.Magnesium over-the-counter two tablets by mouth daily. 14.Potassium chloride 20 mEq one tablet by mouth daily. 15.Plavix 75 mg one tablet by mouth daily.  DISPOSITION:  He is ready to be discharge to [facility] in stable condition.  She is to increase her activity slowly, may shower and bathe.  No lifting for 2 days.  She is to eat a low-sodium, heart-healthy diet.  If her catheter site becomes red, painful swollen, or discharges fluid or pus, she is to call our office at [phone].  She will follow up with Dr. Allyson Sabal in approximately 2 weeks on November 19, 2010 at 9:45 a.m.    ______________________________ Wilburt Finlay, PA   ______________________________ Nanetta Batty, M.D.    BH/MEDQ  D:  11/11/2010  T:  11/12/2010  Job:  [id number]  Electronically Signed by Wilburt Finlay PA on 11/23/2010 04:45:09 PM Electronically Signed by Nanetta Batty M.D. on 11/25/2010 01:59:06 PM

## 2010-12-01 LAB — DIFFERENTIAL
Basophils Relative: 0 % (ref 0–1)
Eosinophils Absolute: 0.1 10*3/uL (ref 0.0–0.7)
Eosinophils Relative: 1 % (ref 0–5)
Lymphs Abs: 2.9 10*3/uL (ref 0.7–4.0)
Monocytes Relative: 8 % (ref 3–12)
Neutrophils Relative %: 62 % (ref 43–77)

## 2010-12-01 LAB — CBC
HCT: 35.3 % — ABNORMAL LOW (ref 36.0–46.0)
MCHC: 34.4 g/dL (ref 30.0–36.0)
MCV: 92.7 fL (ref 78.0–100.0)
Platelets: 243 10*3/uL (ref 150–400)
WBC: 10 10*3/uL (ref 4.0–10.5)

## 2010-12-01 LAB — POCT I-STAT, CHEM 8
Creatinine, Ser: 0.9 mg/dL (ref 0.4–1.2)
Glucose, Bld: 99 mg/dL (ref 70–99)
Hemoglobin: 12.6 g/dL (ref 12.0–15.0)
Potassium: 4.1 mEq/L (ref 3.5–5.1)

## 2010-12-01 LAB — POCT URINALYSIS DIP (DEVICE)
Ketones, ur: NEGATIVE mg/dL
Protein, ur: NEGATIVE mg/dL
pH: 6.5 (ref 5.0–8.0)

## 2010-12-06 LAB — BASIC METABOLIC PANEL
CO2: 28 mEq/L (ref 19–32)
Chloride: 104 mEq/L (ref 96–112)
Creatinine, Ser: 0.9 mg/dL (ref 0.4–1.2)
GFR calc Af Amer: >60 mL/min (ref >60)
Potassium: 3.7 mEq/L (ref 3.5–5.1)
Sodium: 138 mEq/L (ref 135–145)

## 2010-12-06 LAB — CARDIAC PANEL(CRET KIN+CKTOT+MB+TROPI)
CK, MB: 3.2 ng/mL (ref 0.3–4.0)
CK, MB: 3.6 ng/mL (ref 0.3–4.0)
Relative Index: INVALID (ref 0.0–2.5)
Total CK: 67 U/L (ref 7–177)
Troponin I: 0.02 ng/mL (ref 0.00–0.06)
Troponin I: 0.22 ng/mL — ABNORMAL HIGH (ref 0.00–0.06)

## 2010-12-06 LAB — CBC
HCT: 38.9 % (ref 36.0–46.0)
Hemoglobin: 13.1 g/dL (ref 12.0–15.0)
MCV: 93.2 fL (ref 78.0–100.0)
MCV: 93.7 fL (ref 78.0–100.0)
Platelets: 214 10*3/uL (ref 150–400)
RBC: 3.89 MIL/uL (ref 3.87–5.11)
RBC: 4.17 MIL/uL (ref 3.87–5.11)
WBC: 12.5 10*3/uL — ABNORMAL HIGH (ref 4.0–10.5)
WBC: 14.1 10*3/uL — ABNORMAL HIGH (ref 4.0–10.5)

## 2010-12-06 LAB — COMPREHENSIVE METABOLIC PANEL
Alkaline Phosphatase: 42 U/L (ref 39–117)
BUN: 17 mg/dL (ref 6–23)
CO2: 30 mEq/L (ref 19–32)
Chloride: 98 mEq/L (ref 96–112)
Creatinine, Ser: 1.07 mg/dL (ref 0.4–1.2)
GFR calc non Af Amer: 48 mL/min — ABNORMAL LOW (ref >60)
Glucose, Bld: 124 mg/dL — ABNORMAL HIGH (ref 70–99)
Total Bilirubin: 0.6 mg/dL (ref 0.3–1.2)

## 2010-12-06 LAB — URINALYSIS, ROUTINE W REFLEX MICROSCOPIC
Ketones, ur: NEGATIVE mg/dL
Nitrite: NEGATIVE
Specific Gravity, Urine: 1.019 (ref 1.005–1.030)
Urobilinogen, UA: 0.2 mg/dL (ref 0.0–1.0)
pH: 7.5 (ref 5.0–8.0)

## 2010-12-06 LAB — TROPONIN I: Troponin I: 0.05 ng/mL (ref 0.00–0.06)

## 2010-12-06 LAB — PROTIME-INR
INR: 1.19 (ref 0.00–1.49)
Prothrombin Time: 15 seconds (ref 11.6–15.2)

## 2010-12-06 LAB — HEMOGLOBIN A1C: Hgb A1c MFr Bld: 5.9 % (ref 4.6–6.1)

## 2010-12-06 LAB — DIFFERENTIAL
Basophils Absolute: 0 10*3/uL (ref 0.0–0.1)
Basophils Relative: 0 % (ref 0–1)
Lymphocytes Relative: 17 % (ref 12–46)
Neutro Abs: 11.1 10*3/uL — ABNORMAL HIGH (ref 1.7–7.7)

## 2010-12-06 LAB — URINE CULTURE: Colony Count: 25000

## 2010-12-06 LAB — URINE MICROSCOPIC-ADD ON

## 2010-12-06 LAB — CK TOTAL AND CKMB (NOT AT ARMC): Relative Index: INVALID (ref 0.0–2.5)

## 2010-12-06 LAB — MAGNESIUM: Magnesium: 2.2 mg/dL (ref 1.5–2.5)

## 2010-12-06 LAB — POCT CARDIAC MARKERS: Myoglobin, poc: 94.8 ng/mL (ref 12–200)

## 2010-12-27 ENCOUNTER — Other Ambulatory Visit: Payer: Self-pay | Admitting: Emergency Medicine

## 2010-12-27 ENCOUNTER — Emergency Department (HOSPITAL_COMMUNITY): Payer: Medicare Other

## 2010-12-27 ENCOUNTER — Inpatient Hospital Stay (HOSPITAL_COMMUNITY)
Admission: EM | Admit: 2010-12-27 | Discharge: 2010-12-29 | DRG: 309 | Disposition: A | Discharge status: Home / Self Care | Payer: Medicare Other | Attending: Cardiovascular Disease | Admitting: Cardiovascular Disease

## 2010-12-27 DIAGNOSIS — I4891 Unspecified atrial fibrillation: Principal | ICD-10-CM | POA: Diagnosis present

## 2010-12-27 DIAGNOSIS — I6529 Occlusion and stenosis of unspecified carotid artery: Secondary | ICD-10-CM | POA: Diagnosis present

## 2010-12-27 DIAGNOSIS — I200 Unstable angina: Secondary | ICD-10-CM | POA: Diagnosis present

## 2010-12-27 DIAGNOSIS — D72829 Elevated white blood cell count, unspecified: Secondary | ICD-10-CM | POA: Diagnosis present

## 2010-12-27 DIAGNOSIS — I10 Essential (primary) hypertension: Secondary | ICD-10-CM | POA: Diagnosis present

## 2010-12-27 DIAGNOSIS — Z8673 Personal history of transient ischemic attack (TIA), and cerebral infarction without residual deficits: Secondary | ICD-10-CM

## 2010-12-27 DIAGNOSIS — I059 Rheumatic mitral valve disease, unspecified: Secondary | ICD-10-CM | POA: Diagnosis present

## 2010-12-27 DIAGNOSIS — I2510 Atherosclerotic heart disease of native coronary artery without angina pectoris: Secondary | ICD-10-CM | POA: Diagnosis present

## 2010-12-27 DIAGNOSIS — E785 Hyperlipidemia, unspecified: Secondary | ICD-10-CM | POA: Diagnosis present

## 2010-12-27 LAB — COMPREHENSIVE METABOLIC PANEL
Alkaline Phosphatase: 41 U/L (ref 39–117)
BUN: 13 mg/dL (ref 6–23)
Calcium: 8.8 mg/dL (ref 8.4–10.5)
Creatinine, Ser: 0.74 mg/dL (ref 0.4–1.2)
Glucose, Bld: 142 mg/dL — ABNORMAL HIGH (ref 70–99)
Potassium: 4 mEq/L (ref 3.5–5.1)
Total Protein: 5.5 g/dL — ABNORMAL LOW (ref 6.0–8.3)

## 2010-12-27 LAB — MAGNESIUM: Magnesium: 2.3 mg/dL (ref 1.5–2.5)

## 2010-12-27 LAB — TSH: TSH: 2.396 u[IU]/mL (ref 0.350–4.500)

## 2010-12-27 LAB — PROTIME-INR
INR: 0.97 (ref 0.00–1.49)
INR: 1.18 (ref 0.00–1.49)

## 2010-12-27 LAB — HEPARIN LEVEL (UNFRACTIONATED): Heparin Unfractionated: 0.56 IU/mL (ref 0.30–0.70)

## 2010-12-27 LAB — CK TOTAL AND CKMB (NOT AT ARMC)
CK, MB: 7.5 ng/mL (ref 0.3–4.0)
Relative Index: 5.9 — ABNORMAL HIGH (ref 0.0–2.5)
Total CK: 127 U/L (ref 7–177)

## 2010-12-27 LAB — CARDIAC PANEL(CRET KIN+CKTOT+MB+TROPI)
CK, MB: 5.5 ng/mL — ABNORMAL HIGH (ref 0.3–4.0)
CK, MB: 4.8 ng/mL — ABNORMAL HIGH (ref 0.3–4.0)
Relative Index: 5.3 — ABNORMAL HIGH (ref 0.0–2.5)
Relative Index: INVALID (ref 0.0–2.5)
Total CK: 104 U/L (ref 7–177)
Troponin I: 0.05 ng/mL (ref 0.00–0.06)
Troponin I: 0.05 ng/mL (ref 0.00–0.06)

## 2010-12-27 LAB — MRSA PCR SCREENING: MRSA by PCR: NEGATIVE

## 2010-12-27 LAB — LIPID PANEL
Cholesterol: 140 mg/dL (ref 0–200)
HDL: 42 mg/dL (ref >39)

## 2010-12-27 LAB — DIFFERENTIAL
Basophils Absolute: 0 10*3/uL (ref 0.0–0.1)
Eosinophils Relative: 1 % (ref 0–5)
Lymphs Abs: 10 10*3/uL — ABNORMAL HIGH (ref 0.7–4.0)
Monocytes Absolute: 0.6 10*3/uL (ref 0.1–1.0)
Monocytes Relative: 3 % (ref 3–12)
Neutrophils Relative %: 42 % — ABNORMAL LOW (ref 43–77)

## 2010-12-27 LAB — CBC
HCT: 36.5 % (ref 36.0–46.0)
Hemoglobin: 10.7 g/dL — ABNORMAL LOW (ref 12.0–15.0)
MCHC: 33.4 g/dL (ref 30.0–36.0)
MCV: 92.6 fL (ref 78.0–100.0)
RBC: 3.94 MIL/uL (ref 3.87–5.11)
RDW: 13.6 % (ref 11.5–15.5)
WBC: 18.7 10*3/uL — ABNORMAL HIGH (ref 4.0–10.5)
WBC: 17.1 10*3/uL — ABNORMAL HIGH (ref 4.0–10.5)

## 2010-12-27 LAB — APTT
aPTT: 29 seconds (ref 24–37)
aPTT: 147 seconds — ABNORMAL HIGH (ref 24–37)

## 2010-12-27 LAB — BRAIN NATRIURETIC PEPTIDE: Pro B Natriuretic peptide (BNP): 568 pg/mL — ABNORMAL HIGH (ref 0.0–100.0)

## 2010-12-27 LAB — TROPONIN I: Troponin I: 0.04 ng/mL (ref 0.00–0.06)

## 2010-12-28 LAB — POCT I-STAT, CHEM 8
BUN: 22 mg/dL (ref 6–23)
Calcium, Ion: 1.09 mmol/L — ABNORMAL LOW (ref 1.12–1.32)
Creatinine, Ser: 0.9 mg/dL (ref 0.4–1.2)
TCO2: 25 mmol/L (ref 0–100)

## 2010-12-28 LAB — POCT CARDIAC MARKERS
CKMB, poc: 3.4 ng/mL (ref 1.0–8.0)
Myoglobin, poc: 94.7 ng/mL (ref 12–200)
Troponin i, poc: <0.05 ng/mL (ref 0.00–0.09)

## 2010-12-28 LAB — CBC
HCT: 30.2 % — ABNORMAL LOW (ref 36.0–46.0)
Hemoglobin: 9.9 g/dL — ABNORMAL LOW (ref 12.0–15.0)
MCH: 30.7 pg (ref 26.0–34.0)
MCHC: 32.8 g/dL (ref 30.0–36.0)

## 2010-12-29 LAB — CBC
MCH: 30.9 pg (ref 26.0–34.0)
MCHC: 33.2 g/dL (ref 30.0–36.0)
Platelets: 291 10*3/uL (ref 150–400)
RDW: 13.9 % (ref 11.5–15.5)

## 2011-01-06 NOTE — Discharge Summary (Signed)
  NAME:  Sara [name], Sara [name]                  ACCOUNT NO.:  [id number]  MEDICAL RECORD NO.:  [id number]           PATIENT TYPE:  I  LOCATION:  3705                         FACILITY:  [facility]  PHYSICIAN:  Nanetta Batty, M.D.   DATE OF BIRTH:  1918-12-06  DATE OF ADMISSION:  12/27/2010 DATE OF DISCHARGE:  12/29/2010                              DISCHARGE SUMMARY   DISCHARGE DIAGNOSES: 1. Paroxysmal atrial fibrillation, sinus rhythm at discharge. 2. Poor Coumadin candidate secondary to advanced age, plan is for     aspirin and Plavix. 3. Unstable angina on admission, myocardial infarction ruled out with     negative troponins x3. 4. Catheterization in February 2012 revealing 3-vessel disease, and to     be treated medically, the patient felt to be high risk for bypass     grafting or percutaneous intervention. 5. Known vascular disease with left internal carotid artery stenosis. 6. Treated hypertension. 7. Treated dyslipidemia. 8. Psoriasis. 9. Preserved left ventricular function with diastolic dysfunction by     echocardiogram this admission.  HOSPITAL COURSE:  The patient is an 77 year old female who will be 89 in one month, followed by Dr. Allyson Sabal with a history of 3-vessel coronary disease and prior atrial fibrillation as noted above.  In the past, she is not felt to be a Coumadin candidate.  She was admitted on December 27, 2010 with chest pressure consistent with unstable angina.  EKG in the emergency room revealed atrial fibrillation with rapid ventricular response.  She was admitted by Dr. Herbie Baltimore, started on heparin and nitrates and diltiazem.  Echocardiogram was obtained with results as noted above.  She did convert to sinus rhythm spontaneously with diltiazem.  It was felt again that she was too high risk for Coumadin, so we will continue on full-dose aspirin and Plavix.  She was transferred to telemetry and is continued to maintain sinus rhythm.  We feel she can be discharged on  December 29, 2010.  We did increase her beta- blocker and for now we will leave her off the diltiazem.  LABORATORY DATA:  White count 15.7, hemoglobin 10.9, hematocrit 32.8, and platelets 291.  Sodium 143, potassium 4.0, BUN 13, and creatinine 0.74.  Liver functions were normal.  Hemoglobin A1c is 5.9.  CKs and MBs were negative for an MI.  TSH is 2.39.  Chest x-ray shows cardiac enlargement with no acute process.  EKG on December 28, 2010 shows sinus rhythm with nonspecific ST changes.  DISPOSITION:  The patient is discharged in stable condition and will follow up with Dr. Allyson Sabal as an outpatient.     Abelino Derrick, P.A.   ______________________________ Nanetta Batty, M.D.    Sara [name]  D:  12/29/2010  T:  12/30/2010  Job:  [id number]  cc:   Soyla Murphy. Renne Crigler, M.D.  Electronically Signed by Corine Shelter P.A. on 12/30/2010 03:02:36 PM Electronically Signed by Nanetta Batty M.D. on 01/06/2011 04:12:35 PM

## 2011-01-12 DIAGNOSIS — S42292A Other displaced fracture of upper end of left humerus, initial encounter for closed fracture: Secondary | ICD-10-CM

## 2011-01-12 HISTORY — DX: Other displaced fracture of upper end of left humerus, initial encounter for closed fracture: S42.292A

## 2011-01-14 ENCOUNTER — Emergency Department (HOSPITAL_COMMUNITY): Payer: Medicare Other

## 2011-01-14 ENCOUNTER — Emergency Department (HOSPITAL_COMMUNITY)
Admission: EM | Admit: 2011-01-14 | Discharge: 2011-01-14 | Disposition: A | Discharge status: Home / Self Care | Payer: Medicare Other | Attending: General Surgery | Admitting: General Surgery

## 2011-01-14 DIAGNOSIS — I509 Heart failure, unspecified: Secondary | ICD-10-CM | POA: Insufficient documentation

## 2011-01-14 DIAGNOSIS — S81009A Unspecified open wound, unspecified knee, initial encounter: Secondary | ICD-10-CM | POA: Insufficient documentation

## 2011-01-14 DIAGNOSIS — S46909A Unspecified injury of unspecified muscle, fascia and tendon at shoulder and upper arm level, unspecified arm, initial encounter: Secondary | ICD-10-CM | POA: Insufficient documentation

## 2011-01-14 DIAGNOSIS — Z7982 Long term (current) use of aspirin: Secondary | ICD-10-CM | POA: Insufficient documentation

## 2011-01-14 DIAGNOSIS — T07XXXA Unspecified multiple injuries, initial encounter: Secondary | ICD-10-CM | POA: Insufficient documentation

## 2011-01-14 DIAGNOSIS — I2510 Atherosclerotic heart disease of native coronary artery without angina pectoris: Secondary | ICD-10-CM | POA: Insufficient documentation

## 2011-01-14 DIAGNOSIS — Y9289 Other specified places as the place of occurrence of the external cause: Secondary | ICD-10-CM | POA: Insufficient documentation

## 2011-01-14 DIAGNOSIS — S42293A Other displaced fracture of upper end of unspecified humerus, initial encounter for closed fracture: Secondary | ICD-10-CM | POA: Insufficient documentation

## 2011-01-14 DIAGNOSIS — M25529 Pain in unspecified elbow: Secondary | ICD-10-CM | POA: Insufficient documentation

## 2011-01-14 DIAGNOSIS — I10 Essential (primary) hypertension: Secondary | ICD-10-CM | POA: Insufficient documentation

## 2011-01-14 DIAGNOSIS — Z8673 Personal history of transient ischemic attack (TIA), and cerebral infarction without residual deficits: Secondary | ICD-10-CM | POA: Insufficient documentation

## 2011-01-14 DIAGNOSIS — M79609 Pain in unspecified limb: Secondary | ICD-10-CM | POA: Insufficient documentation

## 2011-01-14 DIAGNOSIS — I4891 Unspecified atrial fibrillation: Secondary | ICD-10-CM | POA: Insufficient documentation

## 2011-01-14 DIAGNOSIS — S4980XA Other specified injuries of shoulder and upper arm, unspecified arm, initial encounter: Secondary | ICD-10-CM | POA: Insufficient documentation

## 2011-01-14 DIAGNOSIS — Z79899 Other long term (current) drug therapy: Secondary | ICD-10-CM | POA: Insufficient documentation

## 2011-01-14 DIAGNOSIS — S51009A Unspecified open wound of unspecified elbow, initial encounter: Secondary | ICD-10-CM | POA: Insufficient documentation

## 2011-01-26 NOTE — Discharge Summary (Signed)
NAME:  Sara [name], Sara [name]                  ACCOUNT NO.:  [id number]   MEDICAL RECORD NO.:  [id number]          PATIENT TYPE:  INP   LOCATION:  3534                         FACILITY:  [facility]   PHYSICIAN:  Stefani Dama, M.D.  DATE OF BIRTH:  06-Jan-1919   DATE OF ADMISSION:  07/11/2008  DATE OF DISCHARGE:  07/12/2008                               DISCHARGE SUMMARY   ADMITTING DIAGNOSIS:  T12 pathologic compression fracture secondary to  osteoporosis.   DISCHARGE AND FINAL DIAGNOSIS:  T12 pathologic compression fracture  secondary to osteoporosis.   CONDITION ON DISCHARGE:  Improved.   HOSPITAL COURSE:  Ms. [name] is an 77 year old individual who  several weeks ago had developed significant quite severe centralized  back pain with pain radiating into her buttocks and lower extremities.  She was found to have an acute compression fracture of the T12 vertebra  consistent with an osteoporotic compression fracture.  She has other  spondylitic changes in the lumbar spine, but her pain centered across  the thoracolumbar junction, radiated down into the buttocks.  She did  not have any retropulsion of bone into the canal.  Having failed efforts  at conservative management for several weeks' time with pain medications  and bedrest, she was advised regarding acrylic balloon kyphoplasty which  was performed on July 11, 2008.  Postoperatively, the patient noted  an immediate diminution in her pain.  Because of the late hour, she was  maintained in the hospital overnight, but on the following morning, she  notes that she is essentially pain free.  She is quite pleased with her  progress and is notably more mobile and ambulatory.  She is discharged  home at this time to resume all her preoperative medications including  Plavix 75 mg a day which was stopped for a few days before the  procedure.   CONDITION ON DISCHARGE:  Improved.      Stefani Dama, M.D.  Electronically Signed     HJE/MEDQ  D:  07/12/2008  T:  07/12/2008  Job:  [id number]   cc:   Soyla Murphy. Renne Crigler, M.D.

## 2011-01-26 NOTE — H&P (Signed)
NAME:  Sara [name], Sara [name]                  ACCOUNT NO.:  [id number]   MEDICAL RECORD NO.:  [id number]           PATIENT TYPE:   LOCATION:                                 FACILITY:   PHYSICIAN:  Michaelyn Barter, M.D. DATE OF BIRTH:  [birth year]   DATE OF ADMISSION:  12/20/2007  DATE OF DISCHARGE:                              HISTORY & PHYSICAL   PRIMARY CARE DOCTOR:  Sara [name], M.D.   CHIEF COMPLAINT:  Numbness of the lips and left hand.   HISTORY OF PRESENT ILLNESS:  Sara [name] is an 77 year old female with a  past medical history of CVA as well as atrial fibrillation and  hypertension.  She states that last night around 12 a.m. while preparing  for bed she felt a vague numbness in the bottom lip.  She also  experienced some tingling sensation within the three fingers on her left  hand; however, she is vague with regards to when this sensation  initially began its onset.  She initially attributed the numbness to a  fever blister on her lower lip.  She states that shortly after the  symptoms began she went to bed and she slept for approximately 7 hours.  She woke up this morning with the same sensation in her lower lip.  She  got up and was able to complete her regular morning activities including  fixing her breakfast.  She states that she was able to do her normal  routine without any problem.  Sometime later she developed some numbness  within her left three fingers.  She called her primary care doctor and  was told that she should come to the emergency department for  evaluation.  She said that this morning her blood pressure was elevated  with 200/90 being the reading.  She indicates that she has not had any  difficulties walking or using either of her upper extremities.  She  denies any shortness of breath.  No nausea, vomiting, fevers or chills.  She states that sometime this morning she was given clonidine in the  emergency department and shortly afterwards she noticed some  slight  swelling of her tongue afterwards.   PAST MEDICAL HISTORY:  1. Hypertension.  2. CVA.  3. CHF.  4. Atrial fibrillation.  5. Hyponatremia.  6. Dyslipidemia.  7. Hypokalemia.  8. Normocytic anemia.  9. Leukocytosis.  10.Osteoporosis.   PAST SURGICAL HISTORY:  None.   ALLERGIES:  1. RALOXIFENE.  2. PENICILLIN.  3. FOSAMAX.   CURRENT MEDICATIONS:  1. Aspirin 81 mg daily.  2. Atenolol.  3. Chlorthalidone.  4. Coumadin 1.25 mg per day on Tuesday, Thursday, Saturday, Sunday,      and on Monday, Wednesday and Friday 2.5 mg.  5. Amiodarone 200 mg daily.  6. Vitamin D 50,000 international units capsule, 1 capsule every      month.   SOCIAL HISTORY:  Cigarettes:  The patient denies.  Alcohol:  The patient  denies.   FAMILY HISTORY:  Questionable.   REVIEW OF SYSTEMS:  As per HPI, otherwise all other systems are  negative.  PHYSICAL EXAM:  GENERAL:  The patient is awake.  She is cooperative.  She shows no obvious respiratory distress.  VITAL SIGNS:  Her blood pressure is 198/86, heart rate 65, respirations  17, O2 saturation is 98%.  HEENT:  Normocephalic, atraumatic.  Anicteric.  Extraocular movements  are intact.  Decreased pupillary response to light bilaterally.  Oral  mucosa is pink.  No thrush, no exudates.  NECK:  Supple.  No JVD, no lymphadenopathy.  Strong carotid upstrokes  bilaterally.  CARDIAC:  Heart sounds are distant.  RESPIRATORY:  No crackles or wheezes bilaterally.  ABDOMEN:  Flat, soft, nondistended.  No masses palpated.  Bowel sounds  are present.  EXTREMITIES:  No leg edema.  NEUROLOGIC:  The patient is alert and oriented x3.  Cranial nerves II-  XII are intact.  Gag reflex, however, was not tested.  The patient has a  slight facial droop on the left.  She has good grip strength  bilaterally.  Slight decrease of sensation on the left upper lip.  Negative pronator drift.  MUSCULOSKELETAL:  Upper and lower extremity strength 5/5.   LABS:   Sodium is 132 potassium 3.8, chloride 94, CO2 30, glucose 112,  BUN 10, creatinine 0.63, bilirubin total 0.7, alkaline phosphatase 46,  SGOT 29, SGPT 26, total protein 6.7, albumin 4.0, calcium 9.8.  PT is  16.1, INR 1.3.  White blood cell count 9.5, hemoglobin 14.7, hematocrit  41.5, platelets 322.  An EKG was completed.  It reveals normal sinus  rhythm.  The patient had some small, nonpathologic Q waves present.  Transition is achieved by lead V3.   ASSESSMENT/PLAN:  1. Acute oral numbness with right hand numbness.  The etiology of this      is questionable, transient ischemic attack versus cerebrovascular      accident.  Both need to be ruled out.  CT scan of the patient's      head has been completed.  We will follow up the results of the CT      scan.  We will also order an MRI of the patient's brain as well as      carotid Dopplers, 2-D echocardiogram, fasting lipid profile, and we      will consult neurology.  2. Tongue swelling.  The patient indicates that her symptoms started      shortly after clonidine was given/  whether or not this is a      reaction clonidine is questionable.  We will start the patient on a      low dose of steroids for now as well as Benadryl and monitor      closely.  3. History of atrial fibrillation.  The patient currently is in normal      sinus rhythm with the rate being controlled.  We will therefore      monitor this for now.  4. Hypertension.  The patient's blood pressure is uncontrolled.  We      will restart the patient's antihypertensive medications and will      adjust the dosages as necessary.  5. Hyponatremia.  The patient is otherwise asymptomatic.  We will      provide normal saline for now and monitor      the patient's sodium level.  6. Gastrointestinal prophylaxis.  We will provide Protonix.  7. Deep vein thrombosis prophylaxis.  We will provide Lovenox.      Michaelyn Barter, M.D.  Electronically Signed     OR/MEDQ  D:  12/20/2007  T:  12/20/2007  Job:  [id number]   cc:   Sara [name], M.D.

## 2011-01-26 NOTE — Consult Note (Signed)
NEW PATIENT CONSULTATION   Sara [name], Sara [name]  DOB:  1919-03-13                                       09/18/2008  JYNWG#:[id number]   Sara [name] presents today for evaluation of left internal carotid artery  stenosis.  She is a particularly nice 77 year old white female.  She has  had prior neurologic deficits, and during evaluation this was found to  have a high-grade left internal carotid artery stenosis.  She reports  that she had an episode of what sounds like typical amaurosis fugax in  her left eye 2-3 years ago.  More recently in April 2009.  She had  admission to [facility] for a right thalamic stroke.  During this  evaluation she was found to have a severe greater than 80% stenosis of  her left internal carotid artery.  During the timing of this event, it  was felt this was asymptomatic at the time and that due to her advanced  age surgical evaluation was not recommended.  She reports that since  April she tends to have some tingling in her left hand and her left face  but has had no new deficits, specifically any right sided symptoms and  no new visual changes.   PAST MEDICAL HISTORY:  Significant for hypertension, elevated  cholesterol, congestive heart failure in the past.  She did recently  have a lumbar stress fracture and underwent an uneventful lumbar fusion  by Dr. Barnett Abu.   SOCIAL HISTORY:  She is widowed with two children.  She is retired.  She  does not smoke and never did.  She does not drink alcohol.   REVIEW OF SYSTEMS:  As noted in the chart.  Specifically she does have a  history of atrial fibrillation in the past, shortness of breath with  exertion, hiatal hernia.   She has drug sensitivities to penicillin, clonidine, Fosamax, Boniva,  Evista, and Lipitor.   CURRENT MEDICATIONS:  Plavix 75 mg daily, atenolol 25 mg twice daily,  Crestor one-quarter of a 5 mg tablet daily, Avapro 150 mg tablet daily,  Lasix 80 mg tablet daily,  potassium gluconate, 595 mg daily, stool  softener daily, vitamin C, multivitamin daily.   PHYSICAL EXAMINATION:  GENERAL APPEARANCE:  She is a well-developed,  well-nourished white female appearing younger than stated age of 66.  NEUROLOGICAL:  She has no focal neurologic deficits.  Her radial pulses  are 2+, and she has 2+ femoral pulses.  HEART:  Regular rate and rhythm.  I do not appreciate any murmur.  She  does have a harsh left carotid bruit.  No bruit on the right carotid  system.   I have reviewed her prior admission in April.  I have also reviewed her  carotid duplex from April.  This does reveal no significant stenosis in  her right carotid system.  She does have a severe greater than 80%  stenosis of the left carotid system with end-diastolic velocities of  greater than 113 cm/second.  I discussed this at length with Sara [name].  I explained that this does place her at increased risk for stroke due to  her carotid stenosis.  This may or may not be symptomatic carotid  disease since she did have left eye symptoms 2-3 years ago.  Certainly  she does not have any relationship to this to her right  thalamic stroke  in April of 2009.  I discussed options with Ms.  Sara [name].  Despite her age  of 37 she is quite active.  She reports that her sister had a carotid  intervention several years ago and is now 36 and doing well..  I  explained that she had approximately 5% per year risk of stroke or  transient ischemic attack related to her high-grade stenosis. If it is  asymptomatic I explained that her risk would have been higher if her her  left eye symptoms were related to this.  I explained the option of  carotid surgery.  I explained the magnitude of this should be less than  the lumbar surgery that she had several months ago with no incident.  I  did explain the 1-2% risk of stroke with surgery.  We also discussed  carotid stenting.  Despite her advanced age, I feel that she is an   excellent surgical candidate.  I would recommend this with lower initial  stroke rate and higher durability than would be predicted with stenting.  She wished to discuss this further with her family and will notify us if  she wishes to proceed with surgical intervention.  Since she has not had  a duplex since April we would repeat this prior to any surgical  intervention to rule out any change, most specifically thrombosis of her  carotid asymptomatically.  We will discuss this further after she has  had a chance to talk with her family.   Larina Earthly, M.D.  Electronically Signed   TFE/MEDQ  D:  09/18/2008  T:  09/19/2008  Job:  [id number]   cc:   Soyla Murphy. Renne Crigler, M.D.  Marlan Palau, M.D.

## 2011-01-26 NOTE — Consult Note (Signed)
NAME:  Sara [name]                  ACCOUNT NO.:  [id number]   MEDICAL RECORD NO.:  [id number]          PATIENT TYPE:  INP   LOCATION:  3703                         FACILITY:  [facility]   PHYSICIAN:  Marlan Palau, M.D.  DATE OF BIRTH:  1919/01/08   DATE OF CONSULTATION:  DATE OF DISCHARGE:                                 CONSULTATION   HISTORY OF PRESENT ILLNESS:  Sara [name] is an 77 year old right-handed  white female born 1919-06-11, with a history of hypertension and  cerebrovascular disease.  The patient was admitted to [facility] in 2006  with a right occipital stroke at that time.  The patient was noted to  have 70% stenosis of the left internal carotid artery by MRI angiogram.  The patient has been initially treated with antiplatelet agents, but  during March 2008 admission for congestive heart failure, the patient  was noted be in atrial fibrillation.  The patient was placed on Coumadin  and has been followed by Dr. Lucas Mallow over time.  The patient has been told  that her heart rhythm has been regular since the March 2008 admission.  The patient comes in at this point with onset of left face numbness that  began on the evening, prior to admission on the December 19, 2007.  The  patient noted by the next morning that she had more prominent numbness  in the left face and left hand as well.  The patient went to the  hospital for an evaluation.  An MRI scan of the brain showed evidence of  the right thalamic stroke.  Intracranial MRI angiogram showed  atherosclerotic changes bilaterally.  Carotid Doppler study has been  done and shows a proximal 80% stenosis of the left internal carotid  artery.  The patient does have a left carotid bruit.  The patient has  40%-60% stenosis of the right internal carotid artery.  The left  vertebral artery could not be found on carotid Doppler study and by MRI  angiogram in 2006, this artery was significantly diseased.  Neurology  was asked to see this  patient for further evaluation.  A 2-D  echocardiogram is pending.   NIH stroke scale score is 2.   PAST MEDICAL HISTORY:  Significant for:  1. New onset of right thalamic stroke, left facial numbness.  NIH      stroke scale score of 2.  2. History of hypertension.  3. Osteoporosis.  4. Cerebrovascular disease with a stroke in October 2006 involving the      right occipital lobe.  5. Atrial fibrillation.  6. Congestive heart failure.  7. Dyslipidemia.  8. History of anemia.   MEDICATIONS:  At this time include:  1. Norvasc 5 mg daily.  2. Aspirin 325 mg daily.  3. Tenormin 25 mg daily.  4. Methylprednisolone, which was recently discontinued for skin rash,      40 mg every 12 hours.   ALLERGIES:  The patient has allergy to EVISTA, PENICILLIN, and FOSAMAX.   SOCIAL HISTORY:  Does not smoke or drink.  The patient is recently  widowed, lives at friend's home in the [place], [place], area.   The patient is not employed, is retired.   PRIMARY MEDICAL PHYSICIAN:  Soyla Murphy. Renne Crigler, M.D.   FAMILY MEDICAL HISTORY:  Is noted, mother died with cancer.  Father died  with pneumonia.  The patient has 7 brothers and sisters, only 2 of which  are still living.  Brother died with alcoholism.  Sister died with  congestive heart failure, another sister died with tumor of the spinal  cord, and another sister died with congestive heart failure.   REVIEW OF SYSTEMS:  Notable for no recent fevers or chills.  The patient  denies headache, visual field changes, or speech changes, although the  patient had apparent swelling of the tongue on the medication  administered during this hospitalization, with some slurred speech with  this.  This also involved the skin rash, possibly related to clonidine.  The patient has occasional stress incontinence of the bladder.  Denies  shortness of breath and abdominal pain.  The patient has not had any  gait disturbance, although she was using a walker  prior to coming in.   PHYSICAL EXAMINATION:  VITALS:  Blood pressure is currently 139/65,  heart rate 66, respiratory 18, and temperature, afebrile.  GENERAL:  This patient is a fairly well-developed, elderly, white  female, who is alert and cooperative at the time of examination.  HEENT:  Head is atraumatic.  Eyes:  Pupils are postsurgical round and  reactive to light.  Disks are flat bilaterally.  NECK:  Supple.  Left carotid bruits is noted.  RESPIRATORY:  Examination is clear.  CARDIOVASCULAR:  Examination reveals a regular rate and rhythm.  No  obvious murmurs or rubs noted.  EXTREMITIES:  Without significant edema.  NEUROLOGIC:  Cranial nerves:  As above.  Facial symmetry is present.  Patient has good sensation of the face to pinprick, soft touch  bilaterally.  Has good strength of facial muscle, motion of the head  turn, and shoulder shrug bilaterally.  Speech is well enunciated, not  aphasic.  Again, extraocular movements are full, visual fields are full.  Motor testing is 5/5 strength in all fours.  Good symmetric motor tone  is noted throughout.  No drift is seen in the arms or legs.  The patient  has some slight decreased pinprick sensation on the left leg, below the  knees, compared to the right otherwise, symmetric in the arms and  vibratory sensation is symmetric in all fours.  No evidence of  extinction is noted.  The patient has some mild ataxia with finger-nose-  finger on the left upper extremity, not present on the right.  Good heel-  to-shin bilaterally.  The patient is able to ambulate with slightly wide-  based gait.  Deep tendon reflexes are relatively symmetric.  Toes are  neutral bilaterally.  No evidence of an aphasia is noted.   NIH stroke scale score of 2.   LABORATORY VALUES:  Notable for white count 7.7, hemoglobin of 13.3,  hematocrit of 38.9, MCV of 91.7, and platelets of 300.  INR 1.1, it was  1.3 on admission, sodium 128, potassium 4.0, chloride of  92, CO2 of 27,  glucose of 159, BUN of 10, creatinine 0.6, calcium 9.2, cholesterol  level of 204, triglycerides of 63, HDL of 41, LDL of 150, and VLDL of  13.   IMPRESSION:  1. History of cerebrovascular disease with a recurrent right thalamic      stroke  like a small vessel event.  2. History of hypertension.  3. Atrial fibrillation.  Currently, normal sinus rhythm.  4. History of congestive heart failure in the past.   This patient has evidence of a left carotid stenosis in the 80% range  with left carotid bruit.  This is asymptomatic.  Given this patient's  age (almost 77 years old), I will not recommend surgical considerations  of this lesion.  The patient is not in atrial fibrillation during this  admission and was subtherapeutic on Coumadin, again.  I would agree that  switching over to an antiplatelet agent at this point may be reasonable.  A 2-D echocardiogram is pending.  We will follow up, once the study is  done. We will consider switching to Plavix at this time.  We will follow  up patient's  clinical course while in-house.  This patient was not a candidate for  TPA, given the duration of these symptoms.  The patient's surgical and  past medical history also includes bilateral cataract surgery and D&C in  the past.  Patient has had a left wrist fracture from a fall in the past  and a history of tonsillectomy.      Marlan Palau, M.D.  Electronically Signed     CKW/MEDQ  D:  12/21/2007  T:  12/22/2007  Job:  [id number]   cc:   Soyla Murphy. Renne Crigler, M.D.  [facility]

## 2011-01-26 NOTE — Op Note (Signed)
NAME:  TATEKrisann, Mckenna                  ACCOUNT NO.:  [id number]   MEDICAL RECORD NO.:  [id number]          PATIENT TYPE:  INP   LOCATION:  3534                         FACILITY:  [facility]   PHYSICIAN:  Stefani Dama, M.D.  DATE OF BIRTH:  03-05-1919   DATE OF PROCEDURE:  07/11/2008  DATE OF DISCHARGE:                               OPERATIVE REPORT   PREOPERATIVE DIAGNOSIS:  T12 pathologic fracture secondary to  osteoporosis and back pain.   POSTOPERATIVE DIAGNOSIS:  T12 pathologic fracture secondary to  osteoporosis and back pain.   PROCEDURE:  T12 acrylic balloon kyphoplasty.   SURGEON:  Stefani Dama, MD   ANESTHESIA:  General endotracheal.   INDICATIONS:  Wm Fruchter is an 77 year old individual who has had  significant back pain for several weeks' time.  She suffered an acute  fracture of T12 vertebra.  She has failed efforts at conservative  management and was taken to the operating room to undergo acrylic  balloon kyphoplasty.   PROCEDURE:  The patient was brought to the operating room, placed on  table in the supine position.  After smooth induction of general  endotracheal anesthesia, she was turned prone.  The back was carefully  padded and protected.  Fluoroscopic guidance was used in the AP and  lateral projections to identify T12 positively.  Then, at the 10 o'clock  position on the left-sided pedicle of T12 was identified over the lying  area of the skin with appropriate AP and lateral trajectories.  Skin was  infiltrated with 1 mL of lidocaine with epinephrine and 11 blade was  used to make a small incision and Jamshidi needle was then inserted to  the upper outer quadrant of the left-sided pedicle.  It was advanced  using careful fluoroscopic guidance to enter the vertebral body of T12.  A core biopsy was then obtained from T12 and sent for pathologic  diagnosis.  The balloon was then inserted through the Jamshidi needle  into the vertebral body and the balloon was  expanded to maximum of 120  torr.  It decreased rapidly to 60 torr, but restored some height.  Acrylic kyphoplasty cement was then mixed and when we reached  appropriate thick in consistency, a balloon was deflated, withdrawn, and  then the glue was instilled for a total of 5 mL of glue, which filled  across the left to the right and the entirety of the vertebral body.  At  this point, good filling was noted.  The injection and glue stopped.  The glue was tamped into the vertebral body and then the trocar was  removed.  A singular 3-0 Vicryl stitch was placed in the skin.  The  patient tolerated the procedure well and was returned to recovery room  in stable condition.      Stefani Dama, M.D.  Electronically Signed     HJE/MEDQ  D:  07/11/2008  T:  07/12/2008  Job:  [id number]

## 2011-01-29 NOTE — Discharge Summary (Signed)
NAME:  Sara [name], Sara [name]                  ACCOUNT NO.:  [id number]   MEDICAL RECORD NO.:  [id number]          PATIENT TYPE:  INP   LOCATION:  3707                         FACILITY:  [facility]   PHYSICIAN:  Lonia Blood, M.D.      DATE OF BIRTH:  1918/11/03   DATE OF ADMISSION:  12/09/2006  DATE OF DISCHARGE:  12/13/2006                               DISCHARGE SUMMARY   PRIMARY CARE PHYSICIAN:  Soyla Murphy. Renne Crigler, M.D.   DISCHARGE DIAGNOSES:  1. New onset atrial fibrillation.  2. Congestive heart failure with diastolic dysfunction.  3. Hyponatremia which is currently improving.  4. Dyslipidemia.  5. Normocytic anemia.  6. Recent history of cerebrovascular accident.  7. Transient hypokalemia.  8. Leukocytosis and possible urinary tract infection.   DISCHARGE MEDICATIONS:  1. Coumadin currently at 5 mg daily, dose will be 2.5 mg titrated at      the skilled nursing facility.  2. Cardizem CD 240 mg daily.  3. Amiodarone 400 mg p.o. b.i.d.  4. Aspirin 81 mg daily.  5. Atenolol 25 mg daily.  6. Lasix 40 mg daily.  7. Lisinopril 10 mg daily.   DISPOSITION:  The patient will be discharged back to [facility] but  independent living.  Will set her up with some home health PT and OT in  the first few days.  She will have daily PT INRs for the next 3 days and  results called to Dr. Pollie Friar office at [facility] or  Dr. Merri Brunette.  Subsequently, she will have follow up with Dr. Mikeal Hawthorne  in 2 weeks.   PROCEDURES PERFORMED:  Chest x-ray on December 09, 2006 showed cardiomegaly  and mild to moderate pulmonary edema consistent with CHF.  A 2-D  echocardiogram on December 11, 2006 showed an ejection fraction of 65-75%.  Systolic mitral bowing of the anterior and posterior leaflets.  No  evidence of mitral valve prolapse.  The left atrium was mild to  moderately dilated.   BRIEF HISTORY AND PHYSICAL:  Please refer to my dictated history and  physical of December 09, 2006.  In short,  however, the patient is an 77-  year-old female with a history of hypertension and CVA.  She was brought  in secondary to increasing shortness of breath.  In the ED, she was  found to have new onset atrial fibrillation as compared with her prior  EKG in October of 2006.  She was also found to have some leukocytosis,  hypokalemia and hyponatremia.  The patient's BNP was more than 2000  also.  She was subsequently admitted for further management of new onset  atrial fibrillation with congestive heart failure.   HOSPITAL COURSE:  1. New onset atrial fibrillation.  The patient was started on heparin      and Coumadin.  She was on a Cardizem drip until her heart rate was      controlled.  On admission, her heart rate was 132.  Subsequently,      the rate has been controlled, and her Coumadin has been therapeutic  at this point.  We have consulted Dr. Lucas Mallow of cardiology.  She had      a 2-D echocardiogram as described above.  She was also started on      amiodarone.  Midway through, the patient converted to sinus rhythm,      but then slipped back again to atrial fibrillation.  Her atrial      fibrillation seems to be more paroxysmal at this point.  She will      have follow up as an outpatient for further management.  2. Congestive heart failure.  This seems to be diastolic dysfunction      at best, probably from long-standing atrial fibrillation.  As      evidenced from her 2-D echocardiogram, there is no evidence of      systolic dysfunction.  She has, however, been on Lasix, ACE      inhibitor and beta blocker at this point.  Further adjustment of      her medications will be done at [facility].  3. Hyponatremia.  The patient was fluid restricted, and her sodium,      which was 129 on admission, has been corrected and today is 133.  4. Normocytic anemia.  Anemic panel was checked including guaiac      stool, which was negative.  Hemoglobin is mildly low, between 11      and 12.   This could reflect chronic disease more than anything      else.  TSH was within normal limits.  5. Hypokalemia.  Again, this has been corrected.  Her potassium was      3.1 on admission, but that has been corrected now.   DISCHARGE LABORATORIES:  White count 13.6, hemoglobin 11.2, platelet  count 354.  Sodium 133, potassium 4.5, chloride 97, CO2 of 30, BUN 16,  creatinine 0.8, glucose 98, calcium 8.8.   Of note, the patient was also treated for presumed UTI.  She was already  on ciprofloxacin prior to arrival, which we continued.  She had no  fever, but the white count has improved on Cipro.      Lonia Blood, M.D.  Electronically Signed     LG/MEDQ  D:  12/13/2006  T:  12/13/2006  Job:  [id number]

## 2011-01-29 NOTE — Op Note (Signed)
NAME:  Sara [name], Sara [name]                  ACCOUNT NO.:  [id number]   MEDICAL RECORD NO.:  [id number]          PATIENT TYPE:  INP   LOCATION:  4735                         FACILITY:  [facility]   PHYSICIAN:  Pramod P. Pearlean Brownie, MD    DATE OF BIRTH:  02-22-1919   DATE OF PROCEDURE:  DATE OF DISCHARGE:                                 OPERATIVE REPORT   ADMISSION DIAGNOSIS:  Stroke.   DISCHARGE DIAGNOSES:  1.  Right occipital infarction.  2.  Hypertension.  3.  Hyperlipidemia.   HOSPITAL COURSE:  Ms. [name] is an 77 year old pleasant Caucasian lady who  developed sudden onset of headache with flashing lights in the left visual  field.  She mentioned this to the nursing home nurse where she was visiting  her husband, who referred her to an eye doctor, who found a left inferior  quadrantanopsia and sent her to the emergency room to be evaluated for a  stroke.  She was seen by Santina Evans A. Orlin Hilding, M.D., and on exam in the  emergency room she was found to have improvement in her visual field  deficits to practically no focal neurological findings.  CT scan of the head  was unremarkable.  Subsequently an MRI scan of the brain was performed,  which confirmed a small right occipital pole infarct.  The patient was  admitted to the stroke service for risk stratification workup.  She was kept  on telemetry monitoring, which did not reveal any cardiac arrhythmias.  MRA  of the brain revealed no significant stenosis of the posterior cerebral  arteries or the large intracranial vessels.  Subsequently 2-D echocardiogram  was obtained, which revealed normal ejection fraction without an obvious  clot.  Transesophageal echocardiogram was done to look for intracranial  stenosis.  Lipid profile showed elevated LDL cholesterol.  Hemoglobin A1c  was normal.  Homocysteine was normal.  Urine drug screen was negative.  Total cholesterol was normal.  HDL was low at 35.  LDL cholesterol was 119.  CBC and blood chemistries  are normal.  The patient showed improvement in her  symptoms.  On the day of discharge she had minimum left inferior quadrant  field defect.  She could see the objects but could not recognize them  accurately.  The patient was started on Aggrenox for secondary stroke  prevention and Lipitor for her elevated LDL.  She was advised not to drive  until she had a repeat formal visual field testing done in the  ophthalmologist's office.  She was also advised to follow up with her  primary physician, Soyla Murphy. Renne Crigler, M.D., as necessary.  She was asked to  follow up with Dr. Pearlean Brownie in his office in two months.   DISCHARGE MEDICATIONS:  1.  Aggrenox one capsule once a day for two weeks, to be increased to twice      a day subsequently.  2.  Lipitor 10 mg a day.  3.  Potassium 20 mEq twice a day.  4.  Multivitamin once a day.  5.  Vitamin C 500 mg once a day.  ______________________________  Sunny Schlein. Pearlean Brownie, MD     PPS/MEDQ  D:  06/18/2005  T:  06/19/2005  Job:  [id number]   cc:   Soyla Murphy. Renne Crigler, M.D.  Fax: [phone]

## 2011-01-29 NOTE — Discharge Summary (Signed)
NAME:  Sara [name], Sara [name]                  ACCOUNT NO.:  [id number]   MEDICAL RECORD NO.:  [id number]          PATIENT TYPE:  INP   LOCATION:  3703                         FACILITY:  [facility]   PHYSICIAN:  Michaelyn Barter, M.D. DATE OF BIRTH:  02/18/1919   DATE OF ADMISSION:  12/20/2007  DATE OF DISCHARGE:  12/22/2007                               DISCHARGE SUMMARY   FINAL DIAGNOSES:  1. Acute nonhemorrhagic right thalamic infarct.  2. Hyperlipidemia.  3. Tongue swelling.  4. Hyponatremia.  5. Hypertension.  6. Left-sided carotid artery stenosis.   CONSULTATION:  Neurology with Dr. Lesia Sago.   PROCEDURES:  1. CT scan of the head without contrast completed on December 20, 2007.  2. MRI/MRA of the brain completed on December 20, 2007.   HISTORY OF PRESENT ILLNESS:  Ms. Rahimi is an 77 year old female stated  that on the night prior to this admission at approximately 12:00 a.m.  she felt a vague numbness in her bottom of the lip.  She also had some  tingling sensation within the 3 fingers on the left hand.  She initially  attributed the numbness to a fever blister on her lower lip.  Shortly  after the symptoms began, she went to bed and slept for approximately 7  hours.  She woke up the next morning with the sensation still present in  her lower lip.  She got out of bed and completed her normal morning  routine.  She later developed a numbness in her left 3 fingers.  She  called her primary care doctor and was told to come to the hospital for  further evaluation.   PAST MEDICAL HISTORY:  Please see that dictated by Dr. Michaelyn Barter.   HOSPITAL COURSE:  1. Acute nonhemorrhagic right thalamic infarct.  A CT scan of the      patient's head was completed on December 20, 2007.  This revealed      progressive nonspecific white matter type changes.  An MRI was also      completed on December 20, 2007, which revealed a small acute      nonhemorrhagic right thalamic infarct.  An MRA revealed mild to   slightly moderate intracranial atherosclerotic type changes.      Neurology was consulted and Dr. Lesia Sago saw the patient on      December 21, 2007.  His impression was that the patient had a history      of CVA with recurrent right thalamic stroke, likely to be a small      vessel event.  He stated that the patient was not in atrial      fibrillation during this admission and was subtherapeutic on her      Coumadin.  He agreed to switching over to an antiplatelet agent at      this point may be reasonable.  He considered switching the patient      over to Plavix and stated that the patient was not a candidate for      tPA given the duration of her symptoms.  By the day  of discharge,      the patient indicated that she felt significantly better.  2. Hyperlipidemia.  A fasting lipid profile confirmed that the      patient's cholesterol level was 204 with an LDL of 150.  Lipitor      was started.  3. Tongue swelling.  Solu-Medrol and Benadryl were given to the      patient out of concern that this may have been a drug-related      reaction.  4. Hyponatremia.  This was mild.  The patient remained, otherwise,      asymptomatic, therefore the patient's sodium level was simply      monitored.  5. Hypertension.  At the time of her admission, the patient's blood      pressure was noted to have been 200/90.  Attempts were made to      better control the patient's blood pressure during this hospital      course.  6. Left carotid artery stenosis.  Carotid Dopplers were done on December 20, 2007.  The right carotid was noted to have 40%-60% ICA stenosis,      low-to-mid range.  The left side had greater than 80% stenosis,      lower and range of vertebral artery flow was noted to be in 0      grade.  Neurology indicated that given the patient's advanced age,      they would not recommend surgical considerations of the lesion.   On the day of discharge, the patient had no new complaints, so  she  requested to be discharged.  Her temperature was 97.7, heart rate 62,  respirations 19, blood pressure 173/76, and O2 sat 97% on room air.  Her  sodium was 131, potassium 4.0, chloride 94, CO2 of 29, BUN 9, creatinine  0.55, and glucose 108.  PT 17.6 and INR 1.4.  White blood cell count  15.4, hemoglobin 12.4, hematocrit 36.0, and platelets 279.  The patient  was discharged home on Plavix 75 mg p.o. daily.  She was told to resume  all of her previous medications except for Coumadin, take the Plavix  instead.  She was told to call Dr. Renne Crigler within 2-4 weeks for followup  evaluation.  During this hospital course, the patient also had a 2-D  echocardiogram completed on April 9, 20009, the results of which  revealed no echocardiographic evidence for cardiac source of embolism.  Overall, left ventricular systolic function was vigorous.  Left  ventricular EF was 65%-70%.      Michaelyn Barter, M.D.  Electronically Signed     OR/MEDQ  D:  01/25/2008  T:  01/25/2008  Job:  [id number]

## 2011-01-29 NOTE — H&P (Signed)
NAME:  Sara [name], Sara [name]                  ACCOUNT NO.:  [id number]   MEDICAL RECORD NO.:  [id number]          PATIENT TYPE:  EMS   LOCATION:  MAJO                         FACILITY:  [facility]   PHYSICIAN:  Lonia Blood, M.D.      DATE OF BIRTH:  02/05/1919   DATE OF ADMISSION:  12/09/2006  DATE OF DISCHARGE:                              HISTORY & PHYSICAL   PRIMARY CARE PHYSICIAN:  Merri Brunette, MD   PRESENTING COMPLAINT:  Shortness of breath.   HISTORY OF PRESENT ILLNESS:  The patient is an 77 year old female with  history of hypertension and CVA, who is a resident of [facility].  The patient presented with gradually worsening shortness of  breath over the past few days.  She has apparently been feeling weak in  bed and called Dr. Renne Crigler on Wednesday.  She was started on some  ciprofloxacin for presumed UTI.  she has been doing all right until  yesterday when she started having worsening shortness of breath and  decided to come to the emergency room.  She denied any chest pain.  No  cough, no fever.  Denied any PND or orthopnea.  She has some elements of  exertional dyspnea, however.  She had some mild fever at home but on  Wednesday.   PAST MEDICAL HISTORY:  Significant for hypertension and CVA.   ALLERGIES:  SHE IS ALLERGIC TO PENICILLIN.   MEDICATIONS:  Include:  1. Aggrenox 1 tablet daily.  2. Lipitor 10 mg daily.  3. Atenolol 25 mg daily.   SOCIAL HISTORY:  The patient is a resident of [facility].  No history of tobacco or alcohol use.  She is married.   FAMILY HISTORY:  Noncontributory due to the patient's age.   REVIEW OF SYSTEMS:  A 12-point review of systems is mainly by HPI.   VITAL SIGNS:  On exam, temperature of 97.9, blood pressure 157/83, pulse  132, respiratory rate 24, sats 96% on room air.  GENERAL:  The patient is awake, alert, oriented, in no acute distress.  HEENT:  PERL.  EOMI.  Neck is supple.  Mild JVD to the ankle of the jaw,  but no lymphadenopathy.  RESPIRATORY:  Shows good air entry bilaterally.  No wheezes or rales but  basilar crackles.  CARDIOVASCULAR SYSTEM:  She has irregularly irregular rhythm with  tachycardia.  ABDOMEN:  Soft, nontender with positive bowel sounds.  EXTREMITIES:  Show no edema, cyanosis, or clubbing.   LABORATORY:  Showed sodium 121, potassium 3.1, chloride 89, BUN 11,  glucose 161.  Creatinine 0.6, white count 15.4, hemoglobin 4.0, platelet  count 212 with a left shift, ANC of 13.3.  BNP is 20-21.  Initial  cardiac enzymes negative.  Chest x-ray showed cardiomegaly and mild to  moderate pulmonary edema consistent with congestive heart failure.  Her  EKG showed atrial fibrillation with rapid ventricular response with a  rate of 116.  Normal QT interval and nonspecific ST changes.   ASSESSMENT:  This is a 77 year old female presenting with new onset  atrial fibrillation  with right ventricular response, also in congestive  heart failure.  Her previous EKG and 2D echo in October 2006 was  essentially normal.  Hence, this must have developed between October  2006 and today.  Other findings include hyponatremia and hypokalemia.   PLAN:  1. New onset Afib:  We will admit the patient, start her on heparin      and Coumadin.  So far no contraindications seen.  We will also keep      her on Cardizem drips and transition her to oral Cardizem.  I will      consult cardiology.  We will also keep her on telemetry and monitor      her closely.  We will check her serial cardiac enzymes and repeat      2D echocardiogram.  2. Congestive heart failure:  This seems to be in systolic dysfunction      based on the presentation, and this may also be a result of atrial      fibrillation, which may have been going on for awhile without the      patient knowing.  Again, we will proceed as indicated above.  I      will add an ACE inhibitor, keep her on a beta blocker, and add      Lasix to her regimen.   3. Hyponatremia:  This most likely is secondary to the congestive      heart failure.  We will carefully monitor her and sodium level      while diuresing her and see how the patient responds.  4. Dyslipidemia:  We will check phospholipid panel.  We will check TSH      also, and I will keep her on her Lipitor.  5. Hypokalemia:  I will repeat her potassium and follow it closely,      especially in the setting of Lasix.   FOLLOWUP TREATMENT:  Will depend upon cardiology recommendation and the  patient's response in the hospital.      Lonia Blood, M.D.  Electronically Signed     LG/MEDQ  D:  12/09/2006  T:  12/09/2006  Job:  [id number]

## 2011-01-29 NOTE — H&P (Signed)
NAME:  [name], Sara                  ACCOUNT NO.:  [id number]   MEDICAL RECORD NO.:  [id number]          PATIENT TYPE:  INP   LOCATION:  1846                         FACILITY:  [facility]   PHYSICIAN:  Catherine A. Orlin Hilding, M.D.DATE OF BIRTH:  07/25/1919   DATE OF ADMISSION:  06/15/2005  DATE OF DISCHARGE:                                HISTORY & PHYSICAL   This was a code stroke with symptom onset of about 1:30.  She arrived in the  emergency room at about 5:00.  Symptoms have been resolving.   CHIEF COMPLAINT:  Vision change.  She had a loss of vision in the left lower  half of both eyes.   HISTORY OF PRESENT ILLNESS:  [name] is an 77 year old right-handed white  woman with a history of hypertension, otherwise, generally healthy.  She was  taking care of her husband in the nursing home when she had sudden onset of  headache and flashing lights in the left visual field.  She continued to  care for her headache and then went to a care meeting regarding her  headache, mentioned it to the nurse at the nursing home who insisted that  she go see her eye doctor.  She stated that she waited to see her eye doctor  for about two hours there, but once he examined her he detected a left  inferior quadrantanopsia and sent her to the emergency room to be evaluated  for stroke.  Her symptoms have now resolved except for mild headache and the  general sense of things not being bright.   REVIEW OF SYSTEMS:  Negative for any weakness or numbness, speech or  swallowing deficits, language deficit, or confusion.  No chest pain or  shortness of breath.   PAST MEDICAL HISTORY:  1.  Hypertension.  2.  Osteoporosis.   She denies diabetes, hyperlipidemia, coronary artery disease, or any  previous stroke.   MEDICATIONS:  1.  Atenolol/hydrochlorothiazide 100/25 mg 1/2 tablet daily.  2.  Aspirin 81 mg daily.  3.  Multivitamin one daily.  4.  Vitamin C.   ALLERGIES:  1.  EVISTA.  2.  FOSAMAX.  3.   PENICILLIN.   SOCIAL HISTORY:  She is married.  Her husband lives in a nursing home.  She  does not use cigarettes or alcohol.  She is a retired [employer] trader.   FAMILY HISTORY:  Positive for stroke in an aunt.   PHYSICAL EXAMINATION:  VITAL SIGNS:  Blood pressure was 203/84 on admission,  came down to 178/81 on repeat.  Pulse of 66, respirations 18.  HEENT:  Head is normocephalic, atraumatic.  Eyes are dilated by  ophthalmology.  NECK:  Without bruits, supple.  HEART:  Regular rate and rhythm.  EXTREMITIES:  Without edema.  NEUROLOGIC:  She scored 0 on a NIH stroke scale.  She is awake and alert,  fully oriented, answers two questions correctly, follows commands correctly.  She has normal gaze, normal visual fields, no facial weakness.  No drift in  all four extremities.  No ataxia.  Normal sensation.  Normal language  without aphasia.  No dysarthria.  No neglect.  Her score is 0.  Her pupils  are widely dilated.  Visual fields are full to confrontation;  however, to  me.  Extraocular movements are intact.  Facial sensation:  Has normal facial  motor activity.  Hearing is intact.  Palate is symmetric.  Tongue is  midline.  Motor examination:  She has no drift.  She has 5/5 strength in all  four extremities.  Normal rapid fine movements.  No fasciculations atrophy  or tremor.  Reflexes are 1+ and symmetric with downgoing toes.  Coordination:  Finger-to-nose, rapid alternating movement, heel-to-shin are  normal.  Did not ambulate her.  Sensory examination is intact.   LABORATORY DATA:  CT scan of the brain shows no definite acute  abnormalities, but extensive small vessel changes.  CBC is fairly  unremarkable.  Prothrombin time 13.8, INR 1.  CMET is pending.   IMPRESSION:  Transient ischemic attack, right PCA suspect versus small  stroke.  Symptoms resolved four to five hours.  She is asymptomatic now.  She is not a candidate for TPA or studies given her score of 0.   PLAN:  Admit  stroke service for workup to include MRI of the brain and MR  angiogram, carotid Doppler's, 2-D echocardiogram, homocystine level, lipids.  We will increase her aspirin to 325 mg.  She does not require a swallowing  study because she has had a transient ischemic attack.      Catherine A. Orlin Hilding, M.D.  Electronically Signed     CAW/MEDQ  D:  06/15/2005  T:  06/15/2005  Job:  [id number]

## 2011-01-29 NOTE — Procedures (Signed)
[facility]  Patient:    Sara [name], Sara [name] Visit Number: [id number] MRN: [id number]          Service Type: END Location: ENDO Attending Physician:  Sabino Gasser Dictated by:   Sabino Gasser, M.D. Proc. Date: 01/24/02 Admit Date:  01/24/2002                             Procedure Report  PROCEDURE:  Colonoscopy.  INDICATION FOR PROCEDURE:  Rectal bleeding.  ANESTHESIA:  Demerol 50, Versed 3 gm.  DESCRIPTION OF PROCEDURE:  With the patient mildly sedated in the left lateral decubitus position, the Olympus videoscopic colonoscope was inserted in the rectum and passed under direct vision to the cecum identified by the ileocecal valve and appendiceal orifice. From this point, the colonoscope was slowly withdrawn taking circumferential views of the entire colonic mucosa stopping to photograph diverticulosis seen along the way in the sigmoid colon until we reached the rectum which appeared normal on direct and showed hemorrhoids on retroflexed view. The endoscope was straightened and withdrawn. The patients vital signs and pulse oximeter remained stable. The patient tolerated the procedure well without apparent complications.  FINDINGS:  Internal hemorrhoids, diverticulosis of sigmoid colon, otherwise, unremarkable colonoscopic examination to the cecum.  PLAN:  Have the patient follow-up with me as needed. Dictated by:   Sabino Gasser, M.D. Attending Physician:  Sabino Gasser DD:  01/24/02 TD:  01/25/02 Job: [id number]

## 2011-01-29 NOTE — H&P (Signed)
NAME:  Eversley, [place]                  ACCOUNT NO.:  [id number]   MEDICAL RECORD NO.:  [id number]          PATIENT TYPE:  EMS   LOCATION:  MAJO                         FACILITY:  [facility]   PHYSICIAN:  Catherine A. Orlin Hilding, M.D.DATE OF BIRTH:  29-Sep-1918   DATE OF ADMISSION:  06/15/2005  DATE OF DISCHARGE:                                HISTORY & PHYSICAL   Audio too short to transcribe (less than 5 seconds)      Catherine A. Orlin Hilding, M.D.     CAW/MEDQ  D:  06/15/2005  T:  06/15/2005  Job:  [id number]

## 2011-03-01 ENCOUNTER — Inpatient Hospital Stay (HOSPITAL_COMMUNITY)
Admission: EM | Admit: 2011-03-01 | Discharge: 2011-03-02 | DRG: 309 | Disposition: A | Discharge status: Nursing Home (Includes ICF) | Payer: Medicare Other | Attending: Internal Medicine | Admitting: Internal Medicine

## 2011-03-01 ENCOUNTER — Emergency Department (HOSPITAL_COMMUNITY): Payer: Medicare Other

## 2011-03-01 DIAGNOSIS — I200 Unstable angina: Secondary | ICD-10-CM | POA: Diagnosis present

## 2011-03-01 DIAGNOSIS — I10 Essential (primary) hypertension: Secondary | ICD-10-CM | POA: Diagnosis present

## 2011-03-01 DIAGNOSIS — I4891 Unspecified atrial fibrillation: Principal | ICD-10-CM | POA: Diagnosis present

## 2011-03-01 DIAGNOSIS — Z7902 Long term (current) use of antithrombotics/antiplatelets: Secondary | ICD-10-CM

## 2011-03-01 DIAGNOSIS — D72829 Elevated white blood cell count, unspecified: Secondary | ICD-10-CM | POA: Diagnosis present

## 2011-03-01 DIAGNOSIS — R5381 Other malaise: Secondary | ICD-10-CM | POA: Diagnosis present

## 2011-03-01 DIAGNOSIS — I2510 Atherosclerotic heart disease of native coronary artery without angina pectoris: Secondary | ICD-10-CM | POA: Diagnosis present

## 2011-03-01 DIAGNOSIS — I6529 Occlusion and stenosis of unspecified carotid artery: Secondary | ICD-10-CM | POA: Diagnosis present

## 2011-03-01 DIAGNOSIS — Z8673 Personal history of transient ischemic attack (TIA), and cerebral infarction without residual deficits: Secondary | ICD-10-CM

## 2011-03-01 DIAGNOSIS — D638 Anemia in other chronic diseases classified elsewhere: Secondary | ICD-10-CM | POA: Diagnosis present

## 2011-03-01 DIAGNOSIS — I739 Peripheral vascular disease, unspecified: Secondary | ICD-10-CM | POA: Diagnosis present

## 2011-03-01 DIAGNOSIS — L408 Other psoriasis: Secondary | ICD-10-CM | POA: Diagnosis present

## 2011-03-01 DIAGNOSIS — M810 Age-related osteoporosis without current pathological fracture: Secondary | ICD-10-CM | POA: Diagnosis present

## 2011-03-01 DIAGNOSIS — Z79899 Other long term (current) drug therapy: Secondary | ICD-10-CM

## 2011-03-01 DIAGNOSIS — E785 Hyperlipidemia, unspecified: Secondary | ICD-10-CM | POA: Diagnosis present

## 2011-03-01 LAB — CBC
Hemoglobin: 13 g/dL (ref 12.0–15.0)
MCHC: 34.3 g/dL (ref 30.0–36.0)
Platelets: 295 10*3/uL (ref 150–400)
RBC: 4.15 MIL/uL (ref 3.87–5.11)

## 2011-03-01 LAB — CARDIAC PANEL(CRET KIN+CKTOT+MB+TROPI)
CK, MB: 5.2 ng/mL — ABNORMAL HIGH (ref 0.3–4.0)
CK, MB: 4.3 ng/mL — ABNORMAL HIGH (ref 0.3–4.0)
Relative Index: INVALID (ref 0.0–2.5)
Relative Index: INVALID (ref 0.0–2.5)
Total CK: 79 U/L (ref 7–177)

## 2011-03-01 LAB — TROPONIN I: Troponin I: <0.3 ng/mL (ref <0.30)

## 2011-03-01 LAB — DIFFERENTIAL
Basophils Absolute: 0 10*3/uL (ref 0.0–0.1)
Eosinophils Absolute: 0.3 10*3/uL (ref 0.0–0.7)
Lymphocytes Relative: 54 % — ABNORMAL HIGH (ref 12–46)
Monocytes Relative: 5 % (ref 3–12)

## 2011-03-01 LAB — BASIC METABOLIC PANEL
Calcium: 9.8 mg/dL (ref 8.4–10.5)
GFR calc non Af Amer: 50 mL/min — ABNORMAL LOW (ref >60)
Glucose, Bld: 121 mg/dL — ABNORMAL HIGH (ref 70–99)
Potassium: 3.5 mEq/L (ref 3.5–5.1)
Sodium: 137 mEq/L (ref 135–145)

## 2011-03-01 LAB — URINALYSIS, ROUTINE W REFLEX MICROSCOPIC
Bilirubin Urine: NEGATIVE
Hgb urine dipstick: NEGATIVE
Ketones, ur: NEGATIVE mg/dL
Nitrite: NEGATIVE
Urobilinogen, UA: 0.2 mg/dL (ref 0.0–1.0)

## 2011-03-01 LAB — CK TOTAL AND CKMB (NOT AT ARMC)
Relative Index: 4.5 — ABNORMAL HIGH (ref 0.0–2.5)
Total CK: 152 U/L (ref 7–177)

## 2011-03-02 ENCOUNTER — Other Ambulatory Visit: Payer: Self-pay | Admitting: Internal Medicine

## 2011-03-02 LAB — CBC
MCHC: 33.9 g/dL (ref 30.0–36.0)
MCV: 92.2 fL (ref 78.0–100.0)
Platelets: 301 10*3/uL (ref 150–400)
RDW: 14 % (ref 11.5–15.5)
WBC: 20.5 10*3/uL — ABNORMAL HIGH (ref 4.0–10.5)

## 2011-03-02 LAB — BASIC METABOLIC PANEL
BUN: 19 mg/dL (ref 6–23)
Calcium: 9.2 mg/dL (ref 8.4–10.5)
Chloride: 100 mEq/L (ref 96–112)
Creatinine, Ser: 0.66 mg/dL (ref 0.50–1.10)
GFR calc Af Amer: >60 mL/min (ref >60)
GFR calc non Af Amer: >60 mL/min (ref >60)

## 2011-03-07 LAB — CULTURE, BLOOD (ROUTINE X 2)
Culture  Setup Time: 201206181027
Culture  Setup Time: 201206181027
Culture: NO GROWTH
Culture: NO GROWTH
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
## 2011-03-13 NOTE — H&P (Signed)
Sara [name], Sara [name]                  ACCOUNT NO.:  [id number]  MEDICAL RECORD NO.:  [id number]  LOCATION:  3707                         FACILITY:  [facility]  PHYSICIAN:  Lonia Blood, M.D.       DATE OF BIRTH:  11/01/1918  DATE OF ADMISSION:  03/01/2011 DATE OF DISCHARGE:                             HISTORY & PHYSICAL   PRIMARY CARE PHYSICIAN:  Soyla Murphy. Renne Crigler, MD  CHIEF COMPLAINT:  Chest pain.  HISTORY OF PRESENT ILLNESS:  [name] is a 77 year old with multiple medical problems including episode of paroxysmal atrial fibrillation, coronary artery disease with three-vessel coronary artery disease, felt to be high risk for bypass, peripheral vascular disease, hypertension, hyperlipidemia, and psoriasis, on Humira therapy who reported that she had significant chest pressure last night.  She had it in the middle of the chest, 10/10, associated with diaphoresis.  She was brought to the emergency room when an EKG showed ST changes into the anterolateral leads.  Initial cardiac enzymes were within normal limits.  She received some nitroglycerin and a Cardizem drip and currently she is hemodynamically stable without any further chest pain.  She is also without any shortness of breath.  PAST MEDICAL HISTORY: 1. Coronary artery disease with last time she had coronary     catheterization in February 2012 showing LAD with 50%, diagonal     80%, oblique marginal OM-1 90-95%, OM-2 70%, RCA 60-70% - seen by     Dr. Rexanne Mano from [facility] who felt that she should not have     coronary artery bypass grafting due to being too high risk for     that. 2. Paroxysmal atrial fibrillation. 3. Psoriasis. 4. Hyperlipidemia. 5. Recurrent strokes. 6. Hypertension. 7. Left-sided carotid artery stenosis. 8. Anemia of chronic disease. 9. Chronic leukocytosis - according to the patient never been     investigated. 10.Osteoporosis.  HOME MEDICATIONS:  Aspirin, Lasix, labetalol, lisinopril,  magnesium, potassium, probiotic.  The patient also thinks she takes Plavix, but I could not find it on her medication list from the assisted living.  ALLERGIES: 1. CARDIZEM. 2. FOSAMAX. 3. PENICILLIN. 4. TRIAMTERENE.  SOCIAL HISTORY:  She lives in the assisted living, [facility].  She does not smoke cigarettes.  She does not drink alcohol.  She is retired.  FAMILY HISTORY:  Positive for hypertension.  REVIEW OF SYSTEMS:  Per HPI.  Also, positive for significant skin erythrosis desquamation due to psoriasis and treatment with Humira. Also, the patient had recent left humeral fracture and her left arm is in a sling.  She also complains of left elbow pain after she bumped the arm on the door.  Otherwise, negative for fevers or chills.  Negative for focal weakness or numbness.  All other systems reviewed are negative.  PHYSICAL EXAMINATION UPON ADMISSION:  VITAL SIGNS:  Temperature 98.5; blood pressure 135/96; pulse initially 103, currently 70; respirations 14; saturation 100% on room air. GENERAL:  The patient is alert, oriented, in no acute distress. HEAD:  Normocephalic and atraumatic. EYES:  Pupils are equal, round, and reactive to light and accommodation. Extraocular movements are intact.  Throat is intact. NECK:  Supple.  No JVD.  CHEST:  Clear to auscultation bilaterally. HEART:  Irregularly irregular without murmurs, rubs, or gallops. ABDOMEN:  Soft and nontender.  Bowel sounds are present. LOWER EXTREMITIES:  Without edema. SKIN:  Extremely dry, scaly, desquamative erythema.  LABORATORY VALUES AT THE TIME OF ADMISSION:  White blood cell count is 25,000, hemoglobin is 13, and platelet count is 295.  There is absolute lymphocytosis with pathology smear review recommending immunophenotyping.  Urinalysis within normal limits.  Troponin I less than 0.3 and CK-MB 6.9.  Sodium 137, potassium 3.5, chloride 97, bicarbonate 29, BUN 31, creatinine 1, glucose 121, and calcium  9.8. Portable chest x-ray shows cardiomegaly, no acute disease.  ASSESSMENT/PLAN: 1. This is a 77 year old woman presenting to the emergency room with     acute coronary syndrome in the setting of his atrial fibrillation     and rapid ventricular response.  It is unclear which one came     first.  Nevertheless in this patient with severe three-vessel     coronary artery disease, not amenable for surgical intervention.  I     think it is wise to put her in the hospital, resume aspirin and     Plavix, continue Cardizem drip for rate titration, and obtain a     consultation with [facility] to see     if there is anything else we can do for her. 2. Absolute lymphocytosis.  We will send for immunophenotyping. 3. Recent fracture, weakness, and deconditioning.  We will have PT/OT     evaluation to see if she needs short-term nursing home placement.     Lonia Blood, M.D.     SL/MEDQ  D:  03/01/2011  T:  03/01/2011  Job:  [id number]  cc:   Nanetta Batty, M.D. Soyla Murphy. Renne Crigler, M.D.  Electronically Signed by Lonia Blood M.D. on 03/13/2011 04:44:54 PM

## 2011-03-13 NOTE — Discharge Summary (Signed)
[name], Sara [name]                  ACCOUNT NO.:  [id number]  MEDICAL RECORD NO.:  [id number]  LOCATION:  3707                         FACILITY:  [facility]  PHYSICIAN:  Lonia Blood, M.D.       DATE OF BIRTH:  1919/03/04  DATE OF ADMISSION:  03/01/2011 DATE OF DISCHARGE:                              DISCHARGE SUMMARY   PRIMARY CARE PHYSICIAN:  Soyla Murphy. Renne Crigler, MD  PRIMARY CARDIOLOGIST:  Nanetta Batty, MD  DISCHARGE DIAGNOSES: 1. Atrial fibrillation rapid ventricular response, resolved. 2. Chest pain felt to be unstable angina in the setting of known     severe three-vessel coronary artery disease and paroxysmal atrial     fibrillation - resolved. 3. Severe psoriasis on Humira therapy. 4. Hyperlipidemia. 5. History of strokes. 6. Hypertension. 7. Anemia of chronic disease. 8. Chronic leukocytosis - the patient needs outpatient investigation     for these.  We have attempted to send a whole blood flow cytometry     and if the pathology lab has received the specimen, I am going to     personally follow up on the results.  Otherwise, Dr. Merri Brunette     will have to set up a visit with outpatient with Hematology. 9. Left humeral fracture diagnosed on Jan 14, 2011.  DISCHARGE MEDICATIONS: 1. Plavix 75 mg daily. 2. Diltiazem 120 mg daily. 3. Labetalol 100 mg daily. 4. Aspirin 81 mg daily. 5. Coenzyme Q 100 mg daily. 6. Lasix 40 mg daily. 7. Lisinopril 40 mg daily. 8. Magnesium 500 mg daily. 9. Potassium 10 mEq daily. 10.Vitamin C 500 mg daily. 11.Probiotic a tablet twice a day. 12.Quercetin/bromelain 2 tablets daily. 13.Stool softener daily. 14.Vujinsan 1 tablet by mouth twice a day.  CONDITION ON DISCHARGE:  Ms. Peer is to return back to assisted living a friend's home.  CONSULTATION THIS ADMISSION:  The patient was seen in consultation by Dr. Nanetta Batty from [facility].  HISTORY AND PHYSICAL:  Refer to dictated H and P done by Dr. Lavera Guise.  HOSPITAL  COURSE:  [name] is a 77 year old woman with inoperable three- vessel coronary artery disease presented to the emergency room who complains of chest pain with EKG changes in the setting of atrial fibrillation and rapid ventricular response.  The patient was admitted to a telemetry unit and started on intravenous Cardizem drip.  He was also noted that Plavix was missing from her outpatient medications.  She was placed back on Plavix, aspirin was continued.  She had 3 sets of cardiac enzymes measured with troponin-I being less than 0.30 on all 3 measurements.  The patient converted overnight to normal sinus rhythm and she was taken off of the Cardizem drip, switched to oral Cardizem together with labetalol and lisinopril.  She remained hemodynamically stable without any new problems.  On the day of the discharge, temperature was 98.2, pulse 73, respirations 15, blood pressure 129/70, saturation 97% on room air.  The patient is alert, oriented, in no acute distress and she will follow up with her primary care physician, Dr. Merri Brunette and with her primary cardiologist, Dr. Nanetta Batty.     Lonia Blood, M.D.  SL/MEDQ  D:  03/02/2011  T:  03/02/2011  Job:  [id number]  cc:   Soyla Murphy. Renne Crigler, M.D. Nanetta Batty, M.D.  Electronically Signed by Lonia Blood M.D. on 03/13/2011 04:44:59 PM

## 2011-04-13 ENCOUNTER — Inpatient Hospital Stay (HOSPITAL_COMMUNITY)
Admission: RE | Admit: 2011-04-13 | Discharge: 2011-04-14 | DRG: 036 | Disposition: A | Discharge status: Home / Self Care | Payer: Medicare Other | Source: Ambulatory Visit | Attending: Cardiovascular Disease | Admitting: Cardiovascular Disease

## 2011-04-13 DIAGNOSIS — I2510 Atherosclerotic heart disease of native coronary artery without angina pectoris: Secondary | ICD-10-CM | POA: Diagnosis present

## 2011-04-13 DIAGNOSIS — I6529 Occlusion and stenosis of unspecified carotid artery: Principal | ICD-10-CM | POA: Diagnosis present

## 2011-04-13 DIAGNOSIS — L408 Other psoriasis: Secondary | ICD-10-CM | POA: Diagnosis present

## 2011-04-13 DIAGNOSIS — I252 Old myocardial infarction: Secondary | ICD-10-CM

## 2011-04-13 DIAGNOSIS — Z8673 Personal history of transient ischemic attack (TIA), and cerebral infarction without residual deficits: Secondary | ICD-10-CM

## 2011-04-13 DIAGNOSIS — E785 Hyperlipidemia, unspecified: Secondary | ICD-10-CM | POA: Diagnosis present

## 2011-04-13 DIAGNOSIS — I10 Essential (primary) hypertension: Secondary | ICD-10-CM | POA: Diagnosis present

## 2011-04-13 DIAGNOSIS — Z7982 Long term (current) use of aspirin: Secondary | ICD-10-CM

## 2011-04-13 DIAGNOSIS — Z7902 Long term (current) use of antithrombotics/antiplatelets: Secondary | ICD-10-CM

## 2011-04-13 HISTORY — PX: PERCUTANEOUS STENT INTERVENTION: SHX6019

## 2011-04-13 LAB — MRSA PCR SCREENING: MRSA by PCR: NEGATIVE

## 2011-04-14 LAB — CBC
MCHC: 33.8 g/dL (ref 30.0–36.0)
Platelets: 233 10*3/uL (ref 150–400)
RDW: 14.5 % (ref 11.5–15.5)
WBC: 16.5 10*3/uL — ABNORMAL HIGH (ref 4.0–10.5)

## 2011-04-14 LAB — BASIC METABOLIC PANEL
BUN: 19 mg/dL (ref 6–23)
Chloride: 102 mEq/L (ref 96–112)
GFR calc Af Amer: >60 mL/min (ref >60)
GFR calc non Af Amer: >60 mL/min (ref >60)
Potassium: 3.8 mEq/L (ref 3.5–5.1)
Sodium: 137 mEq/L (ref 135–145)

## 2011-04-14 LAB — POCT ACTIVATED CLOTTING TIME: Activated Clotting Time: 347 seconds

## 2011-04-30 NOTE — Procedures (Signed)
NAME:  Sara [name], Sara [name]                  ACCOUNT NO.:  [id number]  MEDICAL RECORD NO.:  [id number]  LOCATION:  2928                         FACILITY:  [facility]  PHYSICIAN:  Nanetta Batty, M.D.   DATE OF BIRTH:  [birth year]  DATE OF PROCEDURE: DATE OF DISCHARGE:                   PERIPHERAL VASCULAR INVASIVE PROCEDURE   HISTORY OF PRESENT ILLNESS:  Ms. Bartley is a very pleasant 77 year old frail-appearing widowed Caucasian female mother of 2, grandmother of 6 grandchildren who I last saw in the office on March 26, 2011.  She has a history of hypertension, hyperlipidemia and prior strokes in the past. She has had known coronary artery disease and has had a non-ST segment elevation myocardial infarction on November 09, 2010.  Cath she had three-vessel disease, preserved LV function with increased LVEDP.  She is seen by Dr. Laneta Simmers who thought she was too high risk for surgery and I felt that her culprit lesions were high risk as well and I elected to treat her medically.  She has had paroxysmal AFib in the past as well. She has been on aspirin and Plavix.  She has had significant psoriasis which has been functioning disabling but it is currently under better control.  She presents now for angiography and potential intervention of high-grade left ICA stenosis by duplex ultrasound.  PROCEDURE DESCRIPTION:  The patient was brought to the [facility] angiographic suite in the post absorptive state.  She was not premedicated.  Her right groin was prepped and shaved in the usual sterile fashion.  Xylocaine 1% was used for local anesthesia.  A 5- French sheath was inserted into the right femoral artery using standard Seldinger technique.  A 5-French pigtail catheter as well as JB-1 catheter was used for arch angiography, selective right and left carotid angiography both intra and extracranial views.  Visipaque dye was used for the entirety of the case.  Retrograde aortic pressures  were monitored during the case.  ANGIOGRAPHIC RESULTS: 1. Arch aortogram; type 3 arch. 2. Right carotid artery; widely patent right internal carotid artery     stenosis. 3. Left carotid; 90% calcified proximal left internal carotid artery     stenosis.  The intracranial portion of this angiogram will be read     by Neuro Interventional Radiology.  Dr. Myra Gianotti assisted during this part of the case.  The 5-French sheath was exchanged over a wire for a 6-French shuttle sheath.  Using a 6- [place] JB-1 shuttle catheter along with an 0.035 Amplatz guidewire, the left common carotid was selected and the JB-1 was advanced into the common carotid.  The shuttle sheath was then advanced over the JB-1 catheter into the midportion of the left common carotid artery.  The patient received angiomas bolus with an ACT of greater than 300.  Dr. Myra Gianotti advanced the 6 mm Angioguard distal protection device across the lesion into the distal extracranial internal carotid artery in the left. Predilatation was performed with a 3 x 2 balloon and stenting with an 8 x 30 precise nitinol self-expanding stent.  Postdilatation was performed with a 5 x 1.5 Sterling at 10 atmospheres resulting reduction of 90% fairly focal calcified proximal left internal carotid artery stenosis to less  than 20% residual.  There was still a small waist on the balloon at 10- 12 atmospheres and I was concerned to go any higher than that.  The patient was neurologically intact throughout the case.  The filter was then retrieved, the sheath was removed and exchanged for a short 6- [place] sheath.  The patient left the lab in stable condition.  IMPRESSION:  Successful left internal carotid artery stenting under the [place] registry protocol for high-risk asymptomatic carotid disease. The patient will continue to be treated with aspirin, Plavix, she will be gently hydrated and will be discharged home in the morning.  She will get  followup Dopplers in our office and I will see her back.     Nanetta Batty, M.D.     JB/MEDQ  D:  04/13/2011  T:  04/14/2011  Job:  [id number]  cc:   [facility] Soyla Murphy. Renne Crigler, M.D. Jorge Ny, MD  Electronically Signed by Nanetta Batty M.D. on 04/30/2011 03:34:13 PM

## 2011-04-30 NOTE — Discharge Summary (Signed)
  Sara [name], Sara [name]                  ACCOUNT NO.:  [id number]  MEDICAL RECORD NO.:  [id number]  LOCATION:  2928                         FACILITY:  [facility]  PHYSICIAN:  Nanetta Batty, M.D.   DATE OF BIRTH:  1919/04/19  DATE OF ADMISSION:  04/13/2011 DATE OF DISCHARGE:  04/14/2011                              DISCHARGE SUMMARY   DIAGNOSES: 1. Tight left internal carotid artery stenosis.     a.     Carotid stenting of the left carotid with Dr. Myra Gianotti and      Dr. Allyson Sabal with a Cordis Precise stent. 2. Coronary artery disease undergoing percutaneous coronary     intervention February 2012. 3. Paroxysmal atrial fibrillation, currently maintaining sinus rhythm,     not a Coumadin candidate, felt to be only for aspirin, Plavix. 4. Psoriasis.  DISCHARGE CONDITION:  Stable.  PROCEDURES:  Carotid angiography by Dr. Allyson Sabal and Dr. Myra Gianotti with PTA and stent to left carotid by Dr. Allyson Sabal and Dr. Myra Gianotti.  DISCHARGE INSTRUCTIONS: 1. Increase activity slowly.  May shower.  No lifting for 1 week.  No     driving. 2. Heart healthy low sodium diet. 3. Wash cath site with soap and water.  Call if any bleeding,     swelling, or drainage. 4. Follow up with Dr. Allyson Sabal, May 12, 2011 at noon. 5. You are scheduled for carotid Dopplers, April 22, 2011 at 4:00 p.m.     in Dr. Hazle Coca office.  DISCHARGE MEDICATIONS:  See med rec sheet.  Only change we increased the aspirin from 162 to 325 for now.  LABS AT DISCHARGE:  Sodium 137, potassium 3.8, chloride 102, CO2 29, glucose 89, BUN 19, creatinine 0.74, and calcium 9.2.  Hemoglobin 10.2, hematocrit 30.2, WBC 16.5, and platelets 233,000.  VITAL SIGNS:  Blood pressure 144/53 to 136/53, pulse 69, respiratory rate 18, temperature 98.2, oxygen saturation 96% on room air.     [name]. [name], N.P.   ______________________________ Nanetta Batty, M.D.    LRI/MEDQ  D:  04/14/2011  T:  04/14/2011  Job:  [id number]  cc:   Jorge Ny,  MD Soyla Murphy. Renne Crigler, M.D.  Electronically Signed by Nada Boozer N.P. on 04/14/2011 04:48:11 PM Electronically Signed by Nanetta Batty M.D. on 04/30/2011 03:34:11 PM

## 2011-05-20 ENCOUNTER — Ambulatory Visit: Payer: Medicare Other | Attending: Orthopedic Surgery | Admitting: Physical Therapy

## 2011-05-20 DIAGNOSIS — IMO0001 Reserved for inherently not codable concepts without codable children: Secondary | ICD-10-CM | POA: Insufficient documentation

## 2011-05-20 DIAGNOSIS — R269 Unspecified abnormalities of gait and mobility: Secondary | ICD-10-CM | POA: Insufficient documentation

## 2011-06-08 LAB — COMPREHENSIVE METABOLIC PANEL
ALT: 26
ALT: 27
AST: 29
Alkaline Phosphatase: 36 — ABNORMAL LOW
Alkaline Phosphatase: 46
CO2: 29
CO2: 30
Calcium: 8.8
Calcium: 9.8
Chloride: 94 — ABNORMAL LOW
GFR calc Af Amer: >60
GFR calc non Af Amer: >60
GFR calc non Af Amer: >60
Glucose, Bld: 108 — ABNORMAL HIGH
Glucose, Bld: 112 — ABNORMAL HIGH
Potassium: 3.8
Potassium: 4
Sodium: 131 — ABNORMAL LOW
Sodium: 132 — ABNORMAL LOW
Total Bilirubin: 0.6
Total Protein: 6.7

## 2011-06-08 LAB — CBC
HCT: 38.9
Hemoglobin: 12.4
Hemoglobin: 14.7
MCHC: 34.3
MCHC: 34.5
MCHC: 35.4
MCV: 91.7
Platelets: 300
RBC: 3.92
RBC: 4.58
RDW: 12.6
WBC: 15.1 — ABNORMAL HIGH

## 2011-06-08 LAB — DIFFERENTIAL
Basophils Relative: 0
Eosinophils Absolute: 0.1
Eosinophils Relative: 1
Lymphs Abs: 1.9
Monocytes Relative: 9

## 2011-06-08 LAB — BASIC METABOLIC PANEL
BUN: 10
Chloride: 92 — ABNORMAL LOW
Creatinine, Ser: 0.6
Glucose, Bld: 159 — ABNORMAL HIGH
Potassium: 4

## 2011-06-08 LAB — PROTIME-INR: Prothrombin Time: 17.6 — ABNORMAL HIGH

## 2011-06-08 LAB — LIPID PANEL
Cholesterol: 204 — ABNORMAL HIGH
Total CHOL/HDL Ratio: 5

## 2011-06-14 LAB — BASIC METABOLIC PANEL
BUN: 11
CO2: 30
Calcium: 9.4
Chloride: 101
Creatinine, Ser: 0.7
Glucose, Bld: 105 — ABNORMAL HIGH

## 2011-06-14 LAB — CBC
MCHC: 34.4
MCV: 91.6
RDW: 12.9

## 2011-12-07 ENCOUNTER — Inpatient Hospital Stay (HOSPITAL_COMMUNITY)
Admission: EM | Admit: 2011-12-07 | Discharge: 2011-12-10 | DRG: 065 | Disposition: A | Discharge status: Skilled Nursing Facility | Payer: Medicare Other | Source: Ambulatory Visit | Attending: Internal Medicine | Admitting: Internal Medicine

## 2011-12-07 ENCOUNTER — Other Ambulatory Visit: Payer: Self-pay

## 2011-12-07 ENCOUNTER — Inpatient Hospital Stay (HOSPITAL_COMMUNITY): Payer: Medicare Other

## 2011-12-07 ENCOUNTER — Emergency Department (HOSPITAL_COMMUNITY): Payer: Medicare Other

## 2011-12-07 ENCOUNTER — Encounter (HOSPITAL_COMMUNITY): Payer: Self-pay | Admitting: Emergency Medicine

## 2011-12-07 DIAGNOSIS — L02419 Cutaneous abscess of limb, unspecified: Secondary | ICD-10-CM | POA: Diagnosis present

## 2011-12-07 DIAGNOSIS — I639 Cerebral infarction, unspecified: Secondary | ICD-10-CM

## 2011-12-07 DIAGNOSIS — I5032 Chronic diastolic (congestive) heart failure: Secondary | ICD-10-CM | POA: Diagnosis present

## 2011-12-07 DIAGNOSIS — C4491 Basal cell carcinoma of skin, unspecified: Secondary | ICD-10-CM | POA: Diagnosis present

## 2011-12-07 DIAGNOSIS — I4891 Unspecified atrial fibrillation: Secondary | ICD-10-CM | POA: Diagnosis present

## 2011-12-07 DIAGNOSIS — D649 Anemia, unspecified: Secondary | ICD-10-CM | POA: Diagnosis present

## 2011-12-07 DIAGNOSIS — Z7982 Long term (current) use of aspirin: Secondary | ICD-10-CM

## 2011-12-07 DIAGNOSIS — E785 Hyperlipidemia, unspecified: Secondary | ICD-10-CM | POA: Diagnosis present

## 2011-12-07 DIAGNOSIS — Z85828 Personal history of other malignant neoplasm of skin: Secondary | ICD-10-CM

## 2011-12-07 DIAGNOSIS — Z9181 History of falling: Secondary | ICD-10-CM

## 2011-12-07 DIAGNOSIS — I252 Old myocardial infarction: Secondary | ICD-10-CM

## 2011-12-07 DIAGNOSIS — I2510 Atherosclerotic heart disease of native coronary artery without angina pectoris: Secondary | ICD-10-CM | POA: Diagnosis present

## 2011-12-07 DIAGNOSIS — Z79899 Other long term (current) drug therapy: Secondary | ICD-10-CM

## 2011-12-07 DIAGNOSIS — K449 Diaphragmatic hernia without obstruction or gangrene: Secondary | ICD-10-CM | POA: Diagnosis present

## 2011-12-07 DIAGNOSIS — I480 Paroxysmal atrial fibrillation: Secondary | ICD-10-CM | POA: Diagnosis present

## 2011-12-07 DIAGNOSIS — I6529 Occlusion and stenosis of unspecified carotid artery: Secondary | ICD-10-CM | POA: Diagnosis present

## 2011-12-07 DIAGNOSIS — L409 Psoriasis, unspecified: Secondary | ICD-10-CM

## 2011-12-07 DIAGNOSIS — D72829 Elevated white blood cell count, unspecified: Secondary | ICD-10-CM | POA: Diagnosis present

## 2011-12-07 DIAGNOSIS — Z8673 Personal history of transient ischemic attack (TIA), and cerebral infarction without residual deficits: Secondary | ICD-10-CM

## 2011-12-07 DIAGNOSIS — I509 Heart failure, unspecified: Secondary | ICD-10-CM | POA: Diagnosis present

## 2011-12-07 DIAGNOSIS — I6340 Cerebral infarction due to embolism of unspecified cerebral artery: Principal | ICD-10-CM | POA: Diagnosis present

## 2011-12-07 DIAGNOSIS — L03119 Cellulitis of unspecified part of limb: Secondary | ICD-10-CM | POA: Diagnosis present

## 2011-12-07 DIAGNOSIS — R29898 Other symptoms and signs involving the musculoskeletal system: Secondary | ICD-10-CM | POA: Diagnosis present

## 2011-12-07 DIAGNOSIS — I10 Essential (primary) hypertension: Secondary | ICD-10-CM | POA: Diagnosis present

## 2011-12-07 DIAGNOSIS — L408 Other psoriasis: Secondary | ICD-10-CM | POA: Diagnosis present

## 2011-12-07 DIAGNOSIS — Z66 Do not resuscitate: Secondary | ICD-10-CM | POA: Diagnosis present

## 2011-12-07 DIAGNOSIS — D509 Iron deficiency anemia, unspecified: Secondary | ICD-10-CM | POA: Diagnosis present

## 2011-12-07 HISTORY — DX: Acute myocardial infarction, unspecified: I21.9

## 2011-12-07 HISTORY — DX: Personal history of transient ischemic attack (TIA), and cerebral infarction without residual deficits: Z86.73

## 2011-12-07 HISTORY — DX: Essential (primary) hypertension: I10

## 2011-12-07 HISTORY — DX: Shortness of breath: R06.02

## 2011-12-07 HISTORY — DX: Unspecified atrial fibrillation: I48.91

## 2011-12-07 HISTORY — DX: Basal cell carcinoma of skin, unspecified: C44.91

## 2011-12-07 HISTORY — DX: Heart failure, unspecified: I50.9

## 2011-12-07 HISTORY — DX: Psoriasis, unspecified: L40.9

## 2011-12-07 HISTORY — DX: Cerebral infarction, unspecified: I63.9

## 2011-12-07 HISTORY — DX: Atherosclerotic heart disease of native coronary artery without angina pectoris: I25.10

## 2011-12-07 HISTORY — DX: Personal history of other diseases of the digestive system: Z87.19

## 2011-12-07 LAB — BASIC METABOLIC PANEL
CO2: 28 mEq/L (ref 19–32)
Calcium: 9.5 mg/dL (ref 8.4–10.5)
Creatinine, Ser: 0.72 mg/dL (ref 0.50–1.10)
GFR calc Af Amer: 85 mL/min — ABNORMAL LOW (ref >90)
Sodium: 136 mEq/L (ref 135–145)

## 2011-12-07 LAB — DIFFERENTIAL
Basophils Absolute: 0.2 10*3/uL — ABNORMAL HIGH (ref 0.0–0.1)
Eosinophils Relative: 2 % (ref 0–5)
Lymphs Abs: 9.7 10*3/uL — ABNORMAL HIGH (ref 0.7–4.0)
Monocytes Absolute: 0.8 10*3/uL (ref 0.1–1.0)
Monocytes Relative: 5 % (ref 3–12)
Neutrophils Relative %: 34 % — ABNORMAL LOW (ref 43–77)

## 2011-12-07 LAB — URINALYSIS, ROUTINE W REFLEX MICROSCOPIC
Glucose, UA: NEGATIVE mg/dL
Ketones, ur: NEGATIVE mg/dL
Leukocytes, UA: NEGATIVE
Nitrite: NEGATIVE
Protein, ur: NEGATIVE mg/dL
Urobilinogen, UA: 0.2 mg/dL (ref 0.0–1.0)

## 2011-12-07 LAB — CBC
Platelets: 265 10*3/uL (ref 150–400)
RBC: 3.75 MIL/uL — ABNORMAL LOW (ref 3.87–5.11)
RDW: 14.4 % (ref 11.5–15.5)
WBC: 16.7 10*3/uL — ABNORMAL HIGH (ref 4.0–10.5)

## 2011-12-07 MED ORDER — ASPIRIN EC 81 MG PO TBEC
162.0000 mg | DELAYED_RELEASE_TABLET | Freq: Every day | ORAL | Status: DC
  Administered 2011-12-07 – 2011-12-10 (×4): 162 mg via ORAL
  Filled 2011-12-07 (×4): qty 2

## 2011-12-07 MED ORDER — CEPHALEXIN 500 MG PO CAPS
500.0000 mg | ORAL_CAPSULE | Freq: Two times a day (BID) | ORAL | Status: AC
  Administered 2011-12-07 – 2011-12-09 (×4): 500 mg via ORAL
  Filled 2011-12-07 (×5): qty 1

## 2011-12-07 MED ORDER — PANTOPRAZOLE SODIUM 40 MG PO TBEC
40.0000 mg | DELAYED_RELEASE_TABLET | Freq: Every day | ORAL | Status: DC
  Administered 2011-12-07 – 2011-12-10 (×4): 40 mg via ORAL
  Filled 2011-12-07 (×4): qty 1

## 2011-12-07 MED ORDER — CLOPIDOGREL BISULFATE 75 MG PO TABS
75.0000 mg | ORAL_TABLET | Freq: Every day | ORAL | Status: DC
  Administered 2011-12-07 – 2011-12-10 (×4): 75 mg via ORAL
  Filled 2011-12-07 (×4): qty 1

## 2011-12-07 MED ORDER — AMLODIPINE BESYLATE 5 MG PO TABS
5.0000 mg | ORAL_TABLET | Freq: Every day | ORAL | Status: DC
  Administered 2011-12-07 – 2011-12-10 (×4): 5 mg via ORAL
  Filled 2011-12-07 (×4): qty 1

## 2011-12-07 MED ORDER — METHOTREXATE 2.5 MG PO TABS
2.5000 mg | ORAL_TABLET | ORAL | Status: DC
  Administered 2011-12-08 – 2011-12-10 (×2): 2.5 mg via ORAL
  Filled 2011-12-07 (×3): qty 1

## 2011-12-07 MED ORDER — VITAMIN C 500 MG PO TABS
500.0000 mg | ORAL_TABLET | Freq: Every day | ORAL | Status: DC
  Administered 2011-12-07 – 2011-12-10 (×4): 500 mg via ORAL
  Filled 2011-12-07 (×4): qty 1

## 2011-12-07 MED ORDER — ISOSORB DINITRATE-HYDRALAZINE 20-37.5 MG PO TABS
0.5000 | ORAL_TABLET | Freq: Two times a day (BID) | ORAL | Status: DC
  Administered 2011-12-07: 16:00:00 via ORAL
  Administered 2011-12-08 – 2011-12-10 (×5): 0.5 via ORAL
  Filled 2011-12-07 (×9): qty 0.5

## 2011-12-07 MED ORDER — DOCUSATE SODIUM 100 MG PO CAPS
100.0000 mg | ORAL_CAPSULE | Freq: Every day | ORAL | Status: DC
  Administered 2011-12-07 – 2011-12-10 (×4): 100 mg via ORAL
  Filled 2011-12-07 (×4): qty 1

## 2011-12-07 MED ORDER — SODIUM CHLORIDE 0.9 % IV SOLN
INTRAVENOUS | Status: DC
  Administered 2011-12-07: 10:00:00 via INTRAVENOUS

## 2011-12-07 MED ORDER — ASPIRIN 325 MG PO TABS: 325.0000 mg | ORAL_TABLET | Freq: Every day | ORAL | Status: DC

## 2011-12-07 MED ORDER — FOLIC ACID 1 MG PO TABS
1.0000 mg | ORAL_TABLET | Freq: Every day | ORAL | Status: DC
  Administered 2011-12-07 – 2011-12-10 (×4): 1 mg via ORAL
  Filled 2011-12-07 (×4): qty 1

## 2011-12-07 MED ORDER — ATORVASTATIN CALCIUM 20 MG PO TABS
20.0000 mg | ORAL_TABLET | Freq: Every day | ORAL | Status: DC
  Administered 2011-12-07 – 2011-12-09 (×2): 20 mg via ORAL
  Filled 2011-12-07 (×4): qty 1

## 2011-12-07 MED ORDER — POTASSIUM CHLORIDE CRYS ER 20 MEQ PO TBCR
20.0000 meq | EXTENDED_RELEASE_TABLET | Freq: Every day | ORAL | Status: DC
  Administered 2011-12-07 – 2011-12-10 (×4): 20 meq via ORAL
  Filled 2011-12-07 (×4): qty 1

## 2011-12-07 MED ORDER — DILTIAZEM HCL ER COATED BEADS 120 MG PO CP24
120.0000 mg | ORAL_CAPSULE | Freq: Every day | ORAL | Status: DC
  Administered 2011-12-07 – 2011-12-10 (×4): 120 mg via ORAL
  Filled 2011-12-07 (×4): qty 1

## 2011-12-07 MED ORDER — ASPIRIN 300 MG RE SUPP
300.0000 mg | Freq: Every day | RECTAL | Status: DC
  Filled 2011-12-07: qty 1

## 2011-12-07 MED ORDER — ENOXAPARIN SODIUM 40 MG/0.4ML ~~LOC~~ SOLN
40.0000 mg | SUBCUTANEOUS | Status: DC
  Administered 2011-12-07 – 2011-12-09 (×3): 40 mg via SUBCUTANEOUS
  Filled 2011-12-07 (×5): qty 0.4

## 2011-12-07 MED ORDER — DILTIAZEM HCL 60 MG PO TABS
120.0000 mg | ORAL_TABLET | Freq: Every day | ORAL | Status: DC
Start: 2011-12-07 — End: 2011-12-07

## 2011-12-07 MED ORDER — LISINOPRIL 20 MG PO TABS
20.0000 mg | ORAL_TABLET | Freq: Every day | ORAL | Status: DC
  Administered 2011-12-07 – 2011-12-10 (×4): 20 mg via ORAL
  Filled 2011-12-07 (×4): qty 1

## 2011-12-07 MED ORDER — FUROSEMIDE 40 MG PO TABS
40.0000 mg | ORAL_TABLET | Freq: Every morning | ORAL | Status: DC
  Administered 2011-12-07 – 2011-12-10 (×4): 40 mg via ORAL
  Filled 2011-12-07 (×4): qty 1

## 2011-12-07 MED ORDER — CARVEDILOL 12.5 MG PO TABS
12.5000 mg | ORAL_TABLET | Freq: Two times a day (BID) | ORAL | Status: DC
  Administered 2011-12-07 – 2011-12-10 (×6): 12.5 mg via ORAL
  Filled 2011-12-07 (×8): qty 1

## 2011-12-07 MED ORDER — SENNOSIDES-DOCUSATE SODIUM 8.6-50 MG PO TABS: 1.0000 | ORAL_TABLET | Freq: Every evening | ORAL | Status: DC | PRN

## 2011-12-07 MED ORDER — SODIUM CHLORIDE 0.9 % IV SOLN: INTRAVENOUS | Status: AC

## 2011-12-07 MED ORDER — OMEGA-3-ACID ETHYL ESTERS 1 G PO CAPS
2.0000 g | ORAL_CAPSULE | Freq: Every day | ORAL | Status: DC
  Administered 2011-12-07 – 2011-12-10 (×4): 2 g via ORAL
  Filled 2011-12-07 (×4): qty 2

## 2011-12-07 NOTE — ED Notes (Signed)
Report given no admit order in computer paged admitting Doctor,

## 2011-12-07 NOTE — Evaluation (Addendum)
Speech Language Pathology Evaluation Patient Details Name: Sara [name] MRN: [id number] DOB: 04-05-1919 Today's Date: 12/07/2011 [id number]  Problem List:  Patient Active Problem List  Diagnoses  . Acute left-sided weakness  . Ankle cellulitis  . Leukocytosis  . Hypertension  . Atrial fibrillation  . Psoriasis  . Basal cell carcinoma  . Chronic diastolic HF (heart failure)  . Anemia   Past Medical History:  Past Medical History  Diagnosis Date  . Hypertension   . Atrial fibrillation   . Psoriasis   . Congestive heart failure (CHF)   . Basal cell carcinoma   . History of CVA (cerebrovascular accident)    Past Surgical History: History reviewed. No pertinent past surgical history.  SLP Assessment/Plan/Recommendation Assessment Clinical Impression Statement: Pt appears with intact cognitive linguistic abilities, language was clear and fluent with intact semantics/syntax/pragmatics, attn/problem solving/judgement also appeared intact.  Pt does not require SLP intervention at this time, please reconsult if note cognitive changes not apparent during this evaluation.  Thanks.  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 Overall Cognitive Status: Appears within functional limits for tasks assessed Arousal/Alertness: Awake/alert Orientation Level: Oriented X4 Attention:  Alternating;Selective;Sustained;Focused Focused Attention: Appears intact Sustained Attention: Appears intact Selective Attention: Appears intact Alternating Attention: Appears intact (pt able to conversate with xray staff and SLP stay on topic) Memory: Appears intact Awareness: Appears intact (verbalizing left leg weak, left facial sensation changes) Problem Solving: Appears intact (raising concern regarding leg weakness) Safety/Judgment: Appears intact (expressed concern re purse/credit cards when slp asked)  Comprehension Auditory Comprehension Overall Auditory Comprehension: Appears within functional limits for tasks assessed Yes/No Questions: Not tested Commands: Not tested Conversation: Complex (pt able to maintain high level conversation with appropriate) Other Conversation Comments: appropriate syntax, semantics and pragmatics, able to maintain high level conversation regarding stroke symptoms, daughters cancer genes and her current remission Reading Comprehension Reading Status: Within funtional limits (fxl for what tested:  pt read calendar, has reading glasses)  Expression Expression Primary Mode of Expression: Verbal Verbal Expression Overall Verbal Expression: Appears within functional limits for tasks assessed Initiation: No impairment Naming: Not tested Pragmatics: No impairment Written Expression Dominant Hand: Right Written Expression: Not tested  Oral/Motor Oral Motor/Sensory Function Labial ROM: Within Functional Limits Labial Symmetry: Within Functional Limits Labial Strength: Within Functional Limits Labial Sensation: Within Functional Limits Lingual ROM: Within Functional Limits Lingual Symmetry: Within Functional Limits Lingual Strength: Within Functional Limits Lingual Sensation: Within Functional Limits Facial ROM: Reduced left Facial Symmetry: Left droop Facial Strength: Within Functional Limits Facial Sensation: Reduced (left, pt states feels  like novocane administered) Velum: Within Functional Limits Mandible: Within Functional Limits Motor Speech Overall Motor Speech: Appears within functional limits for tasks assessed Respiration: Impaired (pt reports "running out of air" when talking, intact for con) Phonation: Normal Resonance: Within functional limits Articulation: Within functional limitis Intelligibility: Intelligible Motor Planning: Witnin functional limits Motor Speech Errors: Not applicable  Donavan Burnet, MS [facility] SLP [phone]   Pt was needing to go to xray at 1513 for xray of  ankle, testing completed at that point.

## 2011-12-07 NOTE — Progress Notes (Signed)
Utilization Review Completed.Christian Borgerding T3/26/2013   

## 2011-12-07 NOTE — ED Notes (Signed)
Patient has history of 2 strokes and woke up with unsteady gait, left side numbness, and slight left facial drop.

## 2011-12-07 NOTE — Progress Notes (Signed)
OT Cancellation Note  Treatment cancelled today due to:  Pt currently on bedrest. Please increase activity tolerance as appropriate.    Sara [name]   OTR/L Pager: [phone] Office: [phone] .

## 2011-12-07 NOTE — ED Notes (Signed)
Onset when person awoke from sleeping unsteady gait, left facial numbness, left slight facial droop. Person alert x 4.

## 2011-12-07 NOTE — ED Notes (Signed)
Patient ax4 calm cooperative resting comfortably on stretcher.

## 2011-12-07 NOTE — Progress Notes (Signed)
PT Cancellation Note  Orders for PT eval and treat received. Treatment cancelled today due to new bedrest order initiated at 1337 with HOB 0-30 degrees. PT will follow up tomorrow. Please upgrade mobility as appropriate.  Thanks!  Romeo Rabon 12/07/2011, 1:46 PM

## 2011-12-07 NOTE — ED Notes (Signed)
Spoke with Admitting Doctor stated will be entering orders and appear in 15 minutes ok to take patient to floor. Called and spoke with floor nurse.

## 2011-12-07 NOTE — ED Notes (Signed)
Patient states normally able to use left lower extremity as well as the right lower extremity.  Woke up with left lower extremity weakness and left side of face numbness.  Patient ax4 answering and following commands appropriate.  Patient stated not to call family members because they are preparing for her granddaughter's wedding.  Airway intact bilateral equal chest rise and fall.

## 2011-12-07 NOTE — H&P (Addendum)
Patient's PCP: Londell Moh, MD, MD  Chief Complaint: Left-sided weakness  History of Present Illness: Sara [name] is a 78 y.o. pleasant caucasian female history of CVA, A. fib currently in sinus rhythm, chronic diastolic congestive heart failure compensated, hypertension, psoriasis, hyperlipidemia, and recent right ankle cellulitis on day 8 of 10 of Keflex who presented with the above complaints.  Patient reported that she has had 2 strokes in the past.  Around 1130 p.m. last night she noted that she had left-sided weakness with increasing numbness over her left lip.  She also noted that she was driving in her left foot but thought that may have been due to wet floor.  She woke up this morning around 730 a.m. and noted left-sided weakness with persistent increased numbness of the left lip.  She presented to the emergency department for further evaluation.  She denies any recent fevers, chills, nausea, vomiting, chest pain, shortness of breath, abdominal pain, headaches or vision changes.  Patient reports that she has been having right ankle pain for at least 3 weeks now and has not improved.  She has been to a dermatologist recently for evaluation of lesion on her left shin and also for her cellulitis of right ankle for which she is taking Keflex.  Past Medical History  Diagnosis Date  . Hypertension   . Atrial fibrillation   . Psoriasis   . Congestive heart failure (CHF)   . Basal cell carcinoma   . History of CVA (cerebrovascular accident)    History reviewed. No pertinent past surgical history. Family History  Problem Relation Age of Onset  . Stomach cancer Mother   . Pneumonia Father    History   Social History  . Marital Status: Widowed    Spouse Name: N/A    Number of Children: N/A  . Years of Education: N/A   Occupational History  . Not on file.   Social History Main Topics  . Smoking status: Never Smoker   . Smokeless tobacco: Not on file  . Alcohol Use: No  .  Drug Use: No  . Sexually Active:    Other Topics Concern  . Not on file   Social History Narrative  . No narrative on file   Allergies: Penicillins; Triamterene; and Fosamax  Meds: Scheduled Meds:   . amLODipine  5 mg Oral Daily  . aspirin  300 mg Rectal Daily   Or  . aspirin  325 mg Oral Daily  . atorvastatin  20 mg Oral q1800  . carvedilol  12.5 mg Oral BID WC  . clopidogrel  75 mg Oral Daily  . diltiazem  120 mg Oral Daily  . docusate sodium  100 mg Oral Daily  . enoxaparin  40 mg Subcutaneous Q24H  . folic acid  1 mg Oral Daily  . furosemide  40 mg Oral q morning - 10a  . isosorbide-hydrALAZINE  0.5 tablet Oral BID  . lisinopril  20 mg Oral Daily  . methotrexate  2.5 mg Oral 3 times weekly  . omega-3 acid ethyl esters  2 g Oral Daily  . pantoprazole  40 mg Oral Daily  . potassium chloride SA  20 mEq Oral Daily  . vitamin C  500 mg Oral Daily   Continuous Infusions:   . sodium chloride    . DISCONTD: sodium chloride 125 mL/hr at 12/07/11 1013   PRN Meds:.senna-docusate  Review of Systems: All systems reviewed with the patient and positive as per history of present illness,  otherwise all other systems are negative.  Physical Exam: Blood pressure 149/66, pulse 63, temperature 98 F (36.7 C), temperature source Oral, resp. rate 16, SpO2 97.00%. General: Awake, Oriented x3, No acute distress. HEENT: EOMI, Moist mucous membranes, slight scleral icterus. Neck: Supple CV: S1 and S2 Lungs: Clear to ascultation bilaterally Abdomen: Soft, Nontender, Nondistended, +bowel sounds. Ext: Good pulses. Trace edema. No clubbing or cyanosis noted.  Scabbed lesion noted over the right lateral malleolus, with pain in the joint from dorsi and plantar flexion.  Ulcerated lesion noted over left shin measuring 2 cm x 3 cm. Neuro: Cranial Nerves II-XII grossly intact. Has 5/5 motor strength in right upper and lower extremities. 4/5 5/5 motor strength in left upper and lower  extremities.  Face symmetrical.  Lab results:  Basename 12/07/11 1012  NA 136  K 3.9  CL 99  CO2 28  GLUCOSE 92  BUN 21  CREATININE 0.72  CALCIUM 9.5  MG --  PHOS --   No results found for this basename: AST:2,ALT:2,ALKPHOS:2,BILITOT:2,PROT:2,ALBUMIN:2 in the last 72 hours No results found for this basename: LIPASE:2,AMYLASE:2 in the last 72 hours  Basename 12/07/11 1012  WBC 16.7*  NEUTROABS 5.7  HGB 11.7*  HCT 34.3*  MCV 91.5  PLT 265   No results found for this basename: CKTOTAL:3,CKMB:3,CKMBINDEX:3,TROPONINI:3 in the last 72 hours No components found with this basename: POCBNP:3 No results found for this basename: DDIMER in the last 72 hours No results found for this basename: HGBA1C:2 in the last 72 hours No results found for this basename: CHOL:2,HDL:2,LDLCALC:2,TRIG:2,CHOLHDL:2,LDLDIRECT:2 in the last 72 hours No results found for this basename: TSH,T4TOTAL,FREET3,T3FREE,THYROIDAB in the last 72 hours No results found for this basename: VITAMINB12:2,FOLATE:2,FERRITIN:2,TIBC:2,IRON:2,RETICCTPCT:2 in the last 72 hours Imaging results:  Ct Head Wo Contrast  12/07/2011  *RADIOLOGY REPORT*  Clinical Data: New onset of left lower extremity weakness and left- sided facial numbness.  CT HEAD WITHOUT CONTRAST  Technique:  Contiguous axial images were obtained from the base of the skull through the vertex without contrast.  Comparison: 11/27/2009 CT.  Findings: No intracranial hemorrhage.  Prominent small vessel disease type changes.  Remote small infarcts right caudate head and right thalamus.  Questionable early acute right periopercular infarct.  No intracranial mass lesion detected on this unenhanced exam.  Global atrophy without hydrocephalus.  Vascular calcifications.  IMPRESSION: No intracranial hemorrhage.  Questionable early acute right periopercular infarct.  Remote right thalamic and right caudate head infarct.  Results called to Dr. Weldon Inches.  Original Report  Authenticated By: Fuller Canada, M.D.   Other results: EKG: Sinus rhythm unchanged from previous EKG..  Assessment & Plan by Problem: Acute left-sided weakness Etiology unclear.  Suspect patient likely has recurrent CVA as noted on head CT which shows questionable early acute right periopercular infarct.  Will initiate stroke workup with MRI/MRA of the brain.  Carotid Dopplers and 2-D echocardiogram.  Continue 2 baby aspirin which the patient already takes at home and continue Plavix.  Continue PT/OT.  Patient passed swallow evaluation in the emergency department.  Will send for lipid panel and hemoglobin A1c.  Right ankle pain/cellulitis Presumed to be from cellulitis.  Continue Keflex for 2 more days to complete a course.  Given the persistent pain in her right ankle will get right ankle x-ray.  Will send for a uric acid in the morning to evaluate for possible gout.  Leukocytosis Likely due to cellulitis.  Anemia Etiology unclear.  Will send for anemia panel in the morning.  Hypertension  Stable continue home medications.  History of A. Fib Currently in sinus rhythm.  Rate controlled.  May have resolved.  Monitor on telemetry.  Psoriasis Management as per her dermatologist as outpatient.  Scleral icterus? Liver function tests in the morning.  Prophylaxis Lovenox  CODE STATUS DO NOT RESUSCITATE/DO NOT INTUBATE.  Discussed with the patient at the time of admission.  Time spent on admission, talking to the patient, and coordinating care was: 60 mins.  Levy Wellman A, MD 12/07/2011, 1:26 PM

## 2011-12-07 NOTE — ED Provider Notes (Addendum)
History     CSN: [id number]  Arrival date & time 12/07/11  6962   First MD Initiated Contact with Patient 12/07/11 1009      Chief Complaint  Patient presents with  . Transient Ischemic Attack    (Consider location/radiation/quality/duration/timing/severity/associated sxs/prior treatment) The history is provided by the patient.   the patient is a 78 year old, female, who lives in an assisted living facility.  She states that last night.  She developed numbness around the left side of her face, as well as a sensation, that her left foot was wet, and it was not working properly.  She went to bed and then, when she woke up again.  Today.  She states that her left foot is still not functioning normally.  Therefore, she came to the emergency department for evaluation.  She denies pain anywhere.  She denies vision changes.  She denies nausea, vomiting, cough, difficulty breathing, diarrhea, or urinary tract symptoms.  She has had a history of 2 strokes in the past.  She denies a history of heart attack.  She does not smoke and denies diabetes.  No past medical history on file.  No past surgical history on file.  No family history on file.  History  Substance Use Topics  . Smoking status: Not on file  . Smokeless tobacco: Not on file  . Alcohol Use: Not on file    OB History    No data available      Review of Systems  Constitutional: Negative for fever and chills.  Eyes: Negative for visual disturbance.  Respiratory: Negative for cough.   Cardiovascular: Negative for chest pain.  Gastrointestinal: Negative for nausea, vomiting and abdominal pain.  Genitourinary: Negative for dysuria.  Skin: Negative for rash.  Neurological: Positive for weakness and numbness. Negative for light-headedness and headaches.  Psychiatric/Behavioral: Negative for confusion.  All other systems reviewed and are negative.    Allergies  Penicillins; Triamterene; and Fosamax  Home Medications    Current Outpatient Rx  Name Route Sig Dispense Refill  . AMLODIPINE BESYLATE 5 MG PO TABS Oral Take 5 mg by mouth daily.    . ASPIRIN EC 81 MG PO TBEC Oral Take 162 mg by mouth daily.    Marland Kitchen CARVEDILOL 12.5 MG PO TABS Oral Take 12.5 mg by mouth 2 (two) times daily with a meal.    . CLOPIDOGREL BISULFATE 75 MG PO TABS Oral Take 75 mg by mouth daily.    . CO Q 10 100 MG PO CAPS Oral Take 1 capsule by mouth daily.    Marland Kitchen DILTIAZEM HCL 120 MG PO TABS Oral Take 120 mg by mouth daily.    . STOOL SOFTENER PO Oral Take 1 capsule by mouth daily.    Marland Kitchen FOLIC ACID 1 MG PO TABS Oral Take 1 mg by mouth daily.    . FUROSEMIDE 40 MG PO TABS Oral Take 40 mg by mouth every morning.    . ISOSORB DINITRATE-HYDRALAZINE 20-37.5 MG PO TABS Oral Take 0.5 tablets by mouth 2 (two) times daily.    Marland Kitchen LISINOPRIL 40 MG PO TABS Oral Take 20 mg by mouth daily.    Marland Kitchen MAGNESIUM PO Oral Take 1 tablet by mouth daily. 500mg     . METHOTREXATE 2.5 MG PO TABS Oral Take 2.5 mg by mouth 3 (three) times a week. Caution:Chemotherapy. Protect from light.    Marland Kitchen FISH OIL 1000 MG PO CAPS Oral Take 2,000 mg by mouth daily.    Marland Kitchen PANTOPRAZOLE  SODIUM 40 MG PO TBEC Oral Take 40 mg by mouth daily.    Marland Kitchen POTASSIUM CHLORIDE CRYS ER 20 MEQ PO TBCR Oral Take 20 mEq by mouth daily.    Marland Kitchen ROSUVASTATIN CALCIUM 20 MG PO TABS Oral Take 10 mg by mouth every other day.    Marland Kitchen VITAMIN C 500 MG PO TABS Oral Take 500 mg by mouth daily.      BP 139/53  Pulse 63  Temp(Src) 97.8 F (36.6 C) (Oral)  Resp 16  SpO2 100%  Physical Exam  Vitals reviewed. Constitutional: She is oriented to person, place, and time. She appears well-developed and well-nourished.  HENT:  Head: Normocephalic and atraumatic.  Eyes: EOM are normal. Pupils are equal, round, and reactive to light.  Neck: Normal range of motion. Neck supple.       Bilateral carotid bruit  Cardiovascular: Normal rate.   Murmur heard. Pulmonary/Chest: Effort normal. No respiratory distress.  Abdominal:  Bowel sounds are normal. She exhibits no distension.  Musculoskeletal: Normal range of motion. She exhibits no edema and no tenderness.  Neurological: She is alert and oriented to person, place, and time. No cranial nerve deficit.       Her cranial nerves are normal.  She has 5 over 5 strength in her cough, or upper extremities.  She denies paresthesias in her lower extremity.      ED Course  Procedures (including critical care time) 78 year old, female, with history of stroke, presents emergency department with new onset of left leg weakness, which began last night.  She has bilateral carotid bruits.  Presently, her mental status is normal with a completely normal.  Neurological examination.  We will perform a CAT scan and laboratory testing, to help define the etiology for her symptoms.  Labs Reviewed  CBC - Abnormal; Notable for the following:    WBC 16.7 (*)    RBC 3.75 (*)    Hemoglobin 11.7 (*)    HCT 34.3 (*)    All other components within normal limits  DIFFERENTIAL - Abnormal; Notable for the following:    Neutrophils Relative 34 (*)    Lymphocytes Relative 58 (*)    Lymphs Abs 9.7 (*)    Basophils Absolute 0.2 (*)    All other components within normal limits  BASIC METABOLIC PANEL - Abnormal; Notable for the following:    GFR calc non Af Amer 73 (*)    GFR calc Af Amer 85 (*)    All other components within normal limits  URINALYSIS, ROUTINE W REFLEX MICROSCOPIC   Ct Head Wo Contrast  12/07/2011  *RADIOLOGY REPORT*  Clinical Data: New onset of left lower extremity weakness and left- sided facial numbness.  CT HEAD WITHOUT CONTRAST  Technique:  Contiguous axial images were obtained from the base of the skull through the vertex without contrast.  Comparison: 11/27/2009 CT.  Findings: No intracranial hemorrhage.  Prominent small vessel disease type changes.  Remote small infarcts right caudate head and right thalamus.  Questionable early acute right periopercular infarct.  No  intracranial mass lesion detected on this unenhanced exam.  Global atrophy without hydrocephalus.  Vascular calcifications.  IMPRESSION: No intracranial hemorrhage.  Questionable early acute right periopercular infarct.  Remote right thalamic and right caudate head infarct.  Results called to Dr. Weldon Inches.  Original Report Authenticated By: Fuller Canada, M.D.     No diagnosis found.  11:28 AM I spoke with the [facility] physician.  He will admit her to the hospital. I  spoke with the neurologist.  She will evaluate the patient for possible new stroke  ED ECG REPORT   Date: 12/07/2011  EKG Time: 12:02 PM  Rate: 59  Rhythm: normal sinus rhythm,    Axis: nl  Intervals:none  ST&T Change: none  Narrative Interpretation: sinus rhythm with old inf q waves            MDM  78 year old, female, with neurological symptoms, and a CAT scan suggesting a new stroke.  She has bilateral carotid bruits as well.  She will be admitted to the hospital for further evaluation and treatment        Cheri Guppy, MD 12/07/11 1129  Cheri Guppy, MD 12/07/11 1204

## 2011-12-07 NOTE — Consult Note (Signed)
Referring Physician: Caparossi    Chief Complaint: Left sided weakness  HPI: Sara [name] is an 78 y.o. female who reports that last night she developed numbness on the left side of her face and in her left foot.  The patient went to sleep and when she awakened this morning continued to have the complaints.  On further questioning it also seems that she was having difficulty using her left foot and feels that her left arm is a little weak as well.   LSN: 12/06/2011 tPA Given: No: Outside time window mRankin:  Past Medical History  Diagnosis Date  . Hypertension   . Atrial fibrillation   . Psoriasis   . Congestive heart failure (CHF)   . Basal cell carcinoma   . History of CVA (cerebrovascular accident)     History reviewed. No pertinent past surgical history.  Family History  Problem Relation Age of Onset  . Stomach cancer Mother   . Pneumonia Father    Social History:  reports that she has never smoked. She does not have any smokeless tobacco history on file. She reports that she does not drink alcohol or use illicit drugs.  Allergies:  Allergies  Allergen Reactions  . Penicillins Other (See Comments)    unknown  . Triamterene Other (See Comments)    unknown  . Fosamax Rash    Medications:  I have reviewed the patient's current medications. Scheduled:   . amLODipine  5 mg Oral Daily  . aspirin EC  162 mg Oral Daily  . atorvastatin  20 mg Oral q1800  . carvedilol  12.5 mg Oral BID WC  . clopidogrel  75 mg Oral Daily  . diltiazem  120 mg Oral Daily  . docusate sodium  100 mg Oral Daily  . enoxaparin  40 mg Subcutaneous Q24H  . folic acid  1 mg Oral Daily  . furosemide  40 mg Oral q morning - 10a  . isosorbide-hydrALAZINE  0.5 tablet Oral BID  . lisinopril  20 mg Oral Daily  . methotrexate  2.5 mg Oral 3 times weekly  . omega-3 acid ethyl esters  2 g Oral Daily  . pantoprazole  40 mg Oral Daily  . potassium chloride SA  20 mEq Oral Daily  . vitamin C  500 mg  Oral Daily  . DISCONTD: aspirin  300 mg Rectal Daily  . DISCONTD: aspirin  325 mg Oral Daily  . DISCONTD: diltiazem  120 mg Oral Daily    ROS: History obtained from the patient  General ROS: negative for - chills, fatigue, fever, night sweats, weight gain or weight loss Psychological ROS: negative for - behavioral disorder, hallucinations, memory difficulties, mood swings or suicidal ideation Ophthalmic ROS: negative for - blurry vision, double vision, eye pain or loss of vision ENT ROS: negative for - epistaxis, nasal discharge, oral lesions, sore throat, tinnitus or vertigo Allergy and Immunology ROS: negative for - hives or itchy/watery eyes Hematological and Lymphatic ROS: negative for - bleeding problems, bruising or swollen lymph nodes Endocrine ROS: negative for - galactorrhea, hair pattern changes, polydipsia/polyuria or temperature intolerance Respiratory ROS: negative for - cough, hemoptysis, shortness of breath or wheezing Cardiovascular ROS: negative for - chest pain, dyspnea on exertion, edema or irregular heartbeat Gastrointestinal ROS: negative for - abdominal pain, diarrhea, hematemesis, nausea/vomiting or stool incontinence Genito-Urinary ROS: negative for - dysuria, hematuria, incontinence or urinary frequency/urgency Musculoskeletal ROS: negative for - joint swelling or muscular weakness Neurological ROS: as noted in HPI Dermatological  ROS: bruising in the lower extremities, psoriatic changes particularly in the feet  Physical Examination: Blood pressure 149/66, pulse 63, temperature 98 F (36.7 C), temperature source Oral, resp. rate 16, SpO2 97.00%.  Neurologic Examination: Mental Status: Alert, oriented, thought content appropriate.  Speech fluent without evidence of aphasia.  Able to follow 3 step commands without difficulty. Cranial Nerves: II: visual fields grossly normal, pupils equal, round, reactive to light and accommodation III,IV, VI: ptosis not  present, extra-ocular motions intact bilaterally V,VII: smile symmetric, facial light touch sensation decreased on the left around the mouth and into the cheek VIII: hearing normal bilaterally IX,X: gag reflex present XI: trapezius strength/neck flexion strength normal bilaterally XII: tongue strength normal  Motor: Right : Upper extremity   5/5    Left:     Upper extremity   5-/5  Lower extremity   5/5     Lower extremity   5-/5 proximally Tone and bulk:normal tone throughout; no atrophy noted Sensory: Pinprick and light touch decreased mildly on the left Deep Tendon Reflexes: 2+ in the upper extremities and at the right KJ.  Left KJ 3+.  Ankle jerks absent.   Plantars: Right: mute   Left: mute Cerebellar: Mild dysmetria with finger-to-nose and heel-to-shin on the left.  Intact on the right  Results for orders placed during the hospital encounter of 12/07/11 (from the past 48 hour(s))  CBC     Status: Abnormal   Collection Time   12/07/11 10:12 AM      Component Value Range Comment   WBC 16.7 (*) 4.0 - 10.5 (K/uL)    RBC 3.75 (*) 3.87 - 5.11 (MIL/uL)    Hemoglobin 11.7 (*) 12.0 - 15.0 (g/dL)    HCT 16.1 (*) 09.6 - 46.0 (%)    MCV 91.5  78.0 - 100.0 (fL)    MCH 31.2  26.0 - 34.0 (pg)    MCHC 34.1  30.0 - 36.0 (g/dL)    RDW 04.5  40.9 - 81.1 (%)    Platelets 265  150 - 400 (K/uL)   DIFFERENTIAL     Status: Abnormal   Collection Time   12/07/11 10:12 AM      Component Value Range Comment   Neutrophils Relative 34 (*) 43 - 77 (%)    Lymphocytes Relative 58 (*) 12 - 46 (%)    Monocytes Relative 5  3 - 12 (%)    Eosinophils Relative 2  0 - 5 (%)    Basophils Relative 1  0 - 1 (%)    Neutro Abs 5.7  1.7 - 7.7 (K/uL)    Lymphs Abs 9.7 (*) 0.7 - 4.0 (K/uL)    Monocytes Absolute 0.8  0.1 - 1.0 (K/uL)    Eosinophils Absolute 0.3  0.0 - 0.7 (K/uL)    Basophils Absolute 0.2 (*) 0.0 - 0.1 (K/uL)    WBC Morphology ATYPICAL LYMPHOCYTES     BASIC METABOLIC PANEL     Status: Abnormal    Collection Time   12/07/11 10:12 AM      Component Value Range Comment   Sodium 136  135 - 145 (mEq/L)    Potassium 3.9  3.5 - 5.1 (mEq/L)    Chloride 99  96 - 112 (mEq/L)    CO2 28  19 - 32 (mEq/L)    Glucose, Bld 92  70 - 99 (mg/dL)    BUN 21  6 - 23 (mg/dL)    Creatinine, Ser 9.14  0.50 - 1.10 (mg/dL)  Calcium 9.5  8.4 - 10.5 (mg/dL)    GFR calc non Af Amer 73 (*) >90 (mL/min)    GFR calc Af Amer 85 (*) >90 (mL/min)   URINALYSIS, ROUTINE W REFLEX MICROSCOPIC     Status: Normal   Collection Time   12/07/11 11:36 AM      Component Value Range Comment   Color, Urine YELLOW  YELLOW     APPearance CLEAR  CLEAR     Specific Gravity, Urine 1.008  1.005 - 1.030     pH 6.5  5.0 - 8.0     Glucose, UA NEGATIVE  NEGATIVE (mg/dL)    Hgb urine dipstick NEGATIVE  NEGATIVE     Bilirubin Urine NEGATIVE  NEGATIVE     Ketones, ur NEGATIVE  NEGATIVE (mg/dL)    Protein, ur NEGATIVE  NEGATIVE (mg/dL)    Urobilinogen, UA 0.2  0.0 - 1.0 (mg/dL)    Nitrite NEGATIVE  NEGATIVE     Leukocytes, UA NEGATIVE  NEGATIVE  MICROSCOPIC NOT DONE ON URINES WITH NEGATIVE PROTEIN, BLOOD, LEUKOCYTES, NITRITE, OR GLUCOSE <1000 mg/dL.   Ct Head Wo Contrast  12/07/2011  *RADIOLOGY REPORT*  Clinical Data: New onset of left lower extremity weakness and left- sided facial numbness.  CT HEAD WITHOUT CONTRAST  Technique:  Contiguous axial images were obtained from the base of the skull through the vertex without contrast.  Comparison: 11/27/2009 CT.  Findings: No intracranial hemorrhage.  Prominent small vessel disease type changes.  Remote small infarcts right caudate head and right thalamus.  Questionable early acute right periopercular infarct.  No intracranial mass lesion detected on this unenhanced exam.  Global atrophy without hydrocephalus.  Vascular calcifications.  IMPRESSION: No intracranial hemorrhage.  Questionable early acute right periopercular infarct.  Remote right thalamic and right caudate head infarct.  Results  called to Dr. Weldon Inches.  Original Report Authenticated By: Fuller Canada, M.D.    Assessment: 78 y.o. female presenting with left sided weakness and numbness.  Outside of the time window for acute intervention.  CT shows possible acute right periopercular infarct.  Old right sided lesions noted as well.  Patient on ASA and Plavix at home.  History of afib and stroke in the past.    Stroke Risk Factors - atrial fibrillation, hypertension and stroke in the past  Plan: 1. HgbA1c, fasting lipid panel 2. MRI, MRA  of the brain without contrast 3. PT consult, OT consult, Speech consult 4. Echocardiogram 5. Carotid dopplers 6. Prophylactic therapy-patient to continue ASA and Plavix for now.  Will investigate why patient not a Cuomadin candidate and make further recommendations based on this information and results of stroke work up.   7. Risk factor modification 8. Telemetry monitoring   Thana Farr, MD [facility] [phone] 12/07/2011, 2:03 PM

## 2011-12-07 NOTE — ED Notes (Signed)
Patient states abdomen distended normal for patient along with skin light pink and red cause from psoriasis.  Patient states bandaged left anterior lower left extremity due to hitting leg against dishwasher seen Doctor in the past.

## 2011-12-08 ENCOUNTER — Inpatient Hospital Stay (HOSPITAL_COMMUNITY): Payer: Medicare Other

## 2011-12-08 DIAGNOSIS — I359 Nonrheumatic aortic valve disorder, unspecified: Secondary | ICD-10-CM

## 2011-12-08 DIAGNOSIS — I639 Cerebral infarction, unspecified: Secondary | ICD-10-CM | POA: Diagnosis present

## 2011-12-08 LAB — RETICULOCYTES
RBC.: 3.54 MIL/uL — ABNORMAL LOW (ref 3.87–5.11)
Retic Ct Pct: 0.8 % (ref 0.4–3.1)

## 2011-12-08 LAB — LIPID PANEL
HDL: 44 mg/dL (ref >39)
LDL Cholesterol: 83 mg/dL (ref 0–99)

## 2011-12-08 LAB — FOLATE: Folate: >20 ng/mL

## 2011-12-08 LAB — HEMOGLOBIN A1C: Hgb A1c MFr Bld: 6.1 % — ABNORMAL HIGH (ref <5.7)

## 2011-12-08 LAB — COMPREHENSIVE METABOLIC PANEL
AST: 23 U/L (ref 0–37)
Albumin: 3.1 g/dL — ABNORMAL LOW (ref 3.5–5.2)
Calcium: 9 mg/dL (ref 8.4–10.5)
Creatinine, Ser: 0.73 mg/dL (ref 0.50–1.10)
Sodium: 142 mEq/L (ref 135–145)

## 2011-12-08 LAB — IRON AND TIBC
Iron: 54 ug/dL (ref 42–135)
Saturation Ratios: 18 % — ABNORMAL LOW (ref 20–55)
TIBC: 306 ug/dL (ref 250–470)
UIBC: 252 ug/dL (ref 125–400)

## 2011-12-08 LAB — CBC
MCH: 30.5 pg (ref 26.0–34.0)
MCV: 92.4 fL (ref 78.0–100.0)
Platelets: 249 10*3/uL (ref 150–400)
RDW: 14.5 % (ref 11.5–15.5)
WBC: 15.8 10*3/uL — ABNORMAL HIGH (ref 4.0–10.5)

## 2011-12-08 LAB — FERRITIN: Ferritin: 18 ng/mL (ref 10–291)

## 2011-12-08 LAB — URIC ACID: Uric Acid, Serum: 5.3 mg/dL (ref 2.4–7.0)

## 2011-12-08 MED ORDER — STROKE: EARLY STAGES OF RECOVERY BOOK
Freq: Once | Status: AC
  Administered 2011-12-08: 04:00:00
  Filled 2011-12-08: qty 1

## 2011-12-08 MED ORDER — ACETAMINOPHEN 500 MG PO TABS
500.0000 mg | ORAL_TABLET | ORAL | Status: DC | PRN
  Administered 2011-12-09: 500 mg via ORAL
  Filled 2011-12-08 (×2): qty 1

## 2011-12-08 MED ORDER — SODIUM CHLORIDE 0.9 % IV BOLUS (SEPSIS): 500.0000 mL | Freq: Once | INTRAVENOUS | Status: DC

## 2011-12-08 MED ORDER — ACETAMINOPHEN 650 MG RE SUPP
325.0000 mg | RECTAL | Status: DC | PRN
  Administered 2011-12-08: 325 mg via RECTAL
  Filled 2011-12-08: qty 1

## 2011-12-08 MED ORDER — WHITE PETROLATUM GEL
Status: AC
  Administered 2011-12-08: 22:00:00
  Filled 2011-12-08: qty 5

## 2011-12-08 MED ORDER — OXYCODONE HCL 5 MG PO TABS
5.0000 mg | ORAL_TABLET | ORAL | Status: DC | PRN
  Administered 2011-12-10: 5 mg via ORAL
  Filled 2011-12-08: qty 1

## 2011-12-08 NOTE — Progress Notes (Signed)
PCP: Londell Moh, MD, MD  Brief HPI:  Sara [name] is a 78 y.o. pleasant caucasian female history of CVA, A. fib currently in sinus rhythm, chronic diastolic congestive heart failure compensated, hypertension, psoriasis, hyperlipidemia, and recent right ankle cellulitis on day 8 of 10 of Keflex who presented with the above complaints. Patient reported that she has had 2 strokes in the past. Around 1130 p.m of the night before admission she noted that she had left-sided weakness with increasing numbness over her left lip. She also noted that she was dragging her left foot but thought that may have been due to wet floor. She woke up the morning of admission around 730 a.m. and noted left-sided weakness with persistent increased numbness of the left lip. She presented to the emergency department for further evaluation. She denied any recent fevers, chills, nausea, vomiting, chest pain, shortness of breath, abdominal pain, headaches or vision changes. Patient reported that she has been having right ankle pain for at least 3 weeks now and has not improved. She has been to a dermatologist recently for evaluation of lesion on her left shin and also for her cellulitis of right ankle for which she is taking Keflex.   Past medical history:  Past Medical History   Diagnosis  Date   .  Hypertension    .  Atrial fibrillation    .  Psoriasis    .  Congestive heart failure (CHF)    .  Basal cell carcinoma    .  History of CVA (cerebrovascular accident)      Consultants: Neurology  Procedures: None so far  Subjective: Patient is mainly concerned about her right ankle which has been paining for 3 weeks. No improvement despite antibiotics. Is more weak on left side than usual.  Objective: Vital signs in last 24 hours: Temp:  [97.4 F (36.3 C)-98.6 F (37 C)] 98.2 F (36.8 C) (03/27 1104) Pulse Rate:  [63-73] 64  (03/27 1104) Resp:  [16-20] 20  (03/27 1104) BP: (119-175)/(49-95) 140/51 mmHg  (03/27 1104) SpO2:  [94 %-98 %] 97 % (03/27 1104) Weight:  [58.4 kg (128 lb 12 oz)] 58.4 kg (128 lb 12 oz) (03/26 2114) Weight change:     Intake/Output from previous day: 03/26 0701 - 03/27 0700 In: 480 [P.O.:480] Out: -  Intake/Output this shift: Total I/O In: 240 [P.O.:240] Out: -   General appearance: alert, cooperative, appears stated age and no distress Head: Normocephalic, without obvious abnormality, atraumatic Eyes: conjunctivae/corneas clear. PERRL, EOM's intact. Neck: no adenopathy, no carotid bruit, no JVD, supple, symmetrical, trachea midline and thyroid not enlarged, symmetric, no tenderness/mass/nodules Resp: clear to auscultation bilaterally Cardio: regular rate and rhythm, S1, S2 normal, systolic murmur over precordium GI: soft, non-tender; bowel sounds normal; no masses,  no organomegaly Extremities: No swelling over right ankle. Mild erythema. Pulses present. Limited ROM. Mild tenderness to palpation. Pulses: 2+ and symmetric Skin: mild erythema over right ankle Lymph nodes: Cervical, supraclavicular, and axillary nodes normal. Neurologic: Left hemiparesis.  Lab Results:  [facility] 12/08/11 0605 12/07/11 1012  WBC 15.8* 16.7*  HGB 10.8* 11.7*  HCT 32.7* 34.3*  PLT 249 265   BMET  Basename 12/08/11 0605 12/07/11 1012  NA 142 136  K 3.8 3.9  CL 107 99  CO2 27 28  GLUCOSE 85 92  BUN 17 21  CREATININE 0.73 0.72  CALCIUM 9.0 9.5  ALT 20 --    Studies/Results: Dg Chest 2 View  12/07/2011  *RADIOLOGY REPORT*  Clinical Data: Status post stroke  CHEST - 2 VIEW  Comparison: 03/01/2011  Findings:  Calcified atherosclerotic disease affects the thoracic aorta.  There is mild cardiac enlargement.  There is no pleural effusion or edema.  Lungs are hyperinflated and there are coarsened interstitial markings identified bilaterally.  No airspace consolidation.  Compression deformity is noted within the lower thoracic spine. This has been treated with bone cement.   IMPRESSION:  1.  Cardiac enlargement. 2.  No heart failure.  Original Report Authenticated By: Rosealee Albee, M.D.   Dg Ankle 2 Views Right  12/07/2011  *RADIOLOGY REPORT*  Clinical Data: Right ankle pain, cellulitis  RIGHT ANKLE - 2 VIEW  Comparison: None.  Findings: No fracture or dislocation is seen.  The ankle mortise is intact.  The base of the fifth metatarsal is unremarkable.  Plantar and posterior calcaneal enthesopathy.  Very mild lateral soft tissue swelling.  No underlying osseous abnormality.  IMPRESSION: No acute osseous abnormality is seen.  Mild lateral soft tissue swelling.  Original Report Authenticated By: Charline Bills, M.D.   Ct Head Wo Contrast  12/07/2011  *RADIOLOGY REPORT*  Clinical Data: New onset of left lower extremity weakness and left- sided facial numbness.  CT HEAD WITHOUT CONTRAST  Technique:  Contiguous axial images were obtained from the base of the skull through the vertex without contrast.  Comparison: 11/27/2009 CT.  Findings: No intracranial hemorrhage.  Prominent small vessel disease type changes.  Remote small infarcts right caudate head and right thalamus.  Questionable early acute right periopercular infarct.  No intracranial mass lesion detected on this unenhanced exam.  Global atrophy without hydrocephalus.  Vascular calcifications.  IMPRESSION: No intracranial hemorrhage.  Questionable early acute right periopercular infarct.  Remote right thalamic and right caudate head infarct.  Results called to Dr. Weldon Inches.  Original Report Authenticated By: Fuller Canada, M.D.   Mr Brain Wo Contrast  12/08/2011  *RADIOLOGY REPORT*  Clinical Data:  New onset of left lower extremity weakness and left- sided facial numbness.  MRI HEAD WITHOUT CONTRAST MRA HEAD WITHOUT CONTRAST  Technique: Multiplanar, multiecho pulse sequences of the brain and surrounding structures were obtained according to standard protocol without intravenous contrast.  Angiographic images of the  head were obtained using MRA technique without contrast.  Comparison: 12/07/2011 CT.  12/20/2007 MR.  MRI HEAD  Findings:  Small acute infarcts superior right frontal lobe (anterior and posterior aspect).  Tiny acute infarct anterior limb right internal capsule superior aspect.  Tiny acute infarcts mid right cerebellum.  Involvement of different vascular distributions raises possibility of embolic disease.  No intracranial hemorrhage.  Prominent small vessel disease type changes.  Remote infarct at the junction of the right thalamus and posterior limb of the right internal capsule.  Remote tiny cerebellar infarcts.  No intracranial mass lesion noted on this unenhanced exam.  Global mild atrophy without hydrocephalus.  Transverse ligament hypertrophy.  Minimal mucosal thickening inferior aspect right maxillary sinus.  IMPRESSION: Small acute infarcts superior right frontal lobe (anterior and posterior aspect).  Tiny acute infarct anterior limb right internal capsule superior aspect.  Tiny acute infarcts mid right cerebellum. Involvement of different vascular distributions raises possibility of embolic disease.  No intracranial hemorrhage.  Prominent small vessel disease type changes.  MRA HEAD  Findings: High-grade focal stenosis distal A1 segment left anterior cerebral artery.  Middle cerebral artery mild branch vessel irregularity bilaterally.  Abnormal appearance left vertebral artery which may be occluded with retrograde flow filling the distal aspect.  Ectatic right vertebral artery and basilar artery without high- grade stenosis.  PICAs not completely imaged on the present exam.  Posterior cerebral artery mild to moderate branch vessel irregularity.  No aneurysm or vascular malformation noted.  IMPRESSION: High-grade focal stenosis distal A1 segment left anterior cerebral artery.  Middle cerebral artery mild branch vessel irregularity bilaterally.  Abnormal appearance left vertebral artery which may be occluded  with retrograde flow filling the distal aspect.  Posterior cerebral artery mild to moderate branch vessel irregularity.  Original Report Authenticated By: Fuller Canada, M.D.   Mr Mra Head/brain Wo Cm  12/08/2011  *RADIOLOGY REPORT*  Clinical Data:  New onset of left lower extremity weakness and left- sided facial numbness.  MRI HEAD WITHOUT CONTRAST MRA HEAD WITHOUT CONTRAST  Technique: Multiplanar, multiecho pulse sequences of the brain and surrounding structures were obtained according to standard protocol without intravenous contrast.  Angiographic images of the head were obtained using MRA technique without contrast.  Comparison: 12/07/2011 CT.  12/20/2007 MR.  MRI HEAD  Findings:  Small acute infarcts superior right frontal lobe (anterior and posterior aspect).  Tiny acute infarct anterior limb right internal capsule superior aspect.  Tiny acute infarcts mid right cerebellum.  Involvement of different vascular distributions raises possibility of embolic disease.  No intracranial hemorrhage.  Prominent small vessel disease type changes.  Remote infarct at the junction of the right thalamus and posterior limb of the right internal capsule.  Remote tiny cerebellar infarcts.  No intracranial mass lesion noted on this unenhanced exam.  Global mild atrophy without hydrocephalus.  Transverse ligament hypertrophy.  Minimal mucosal thickening inferior aspect right maxillary sinus.  IMPRESSION: Small acute infarcts superior right frontal lobe (anterior and posterior aspect).  Tiny acute infarct anterior limb right internal capsule superior aspect.  Tiny acute infarcts mid right cerebellum. Involvement of different vascular distributions raises possibility of embolic disease.  No intracranial hemorrhage.  Prominent small vessel disease type changes.  MRA HEAD  Findings: High-grade focal stenosis distal A1 segment left anterior cerebral artery.  Middle cerebral artery mild branch vessel irregularity bilaterally.   Abnormal appearance left vertebral artery which may be occluded with retrograde flow filling the distal aspect.  Ectatic right vertebral artery and basilar artery without high- grade stenosis.  PICAs not completely imaged on the present exam.  Posterior cerebral artery mild to moderate branch vessel irregularity.  No aneurysm or vascular malformation noted.  IMPRESSION: High-grade focal stenosis distal A1 segment left anterior cerebral artery.  Middle cerebral artery mild branch vessel irregularity bilaterally.  Abnormal appearance left vertebral artery which may be occluded with retrograde flow filling the distal aspect.  Posterior cerebral artery mild to moderate branch vessel irregularity.  Original Report Authenticated By: Fuller Canada, M.D.    Medications:  Scheduled:    .  stroke: mapping our early stages of recovery book   Does not apply Once  . amLODipine  5 mg Oral Daily  . aspirin EC  162 mg Oral Daily  . atorvastatin  20 mg Oral q1800  . carvedilol  12.5 mg Oral BID WC  . cephALEXin  500 mg Oral Q12H  . clopidogrel  75 mg Oral Daily  . diltiazem  120 mg Oral Daily  . docusate sodium  100 mg Oral Daily  . enoxaparin  40 mg Subcutaneous Q24H  . folic acid  1 mg Oral Daily  . furosemide  40 mg Oral q morning - 10a  . isosorbide-hydrALAZINE  0.5 tablet Oral BID  .  lisinopril  20 mg Oral Daily  . methotrexate  2.5 mg Oral 3 times weekly  . omega-3 acid ethyl esters  2 g Oral Daily  . pantoprazole  40 mg Oral Daily  . potassium chloride SA  20 mEq Oral Daily  . vitamin C  500 mg Oral Daily  . DISCONTD: aspirin  300 mg Rectal Daily  . DISCONTD: aspirin  325 mg Oral Daily  . DISCONTD: diltiazem  120 mg Oral Daily    Assessment/Plan:  Principal Problem:  *Stroke, acute, embolic Active Problems:  Acute left-sided weakness  Ankle cellulitis  Leukocytosis  Hypertension  Atrial fibrillation  Psoriasis  Basal cell carcinoma  Chronic diastolic HF (heart failure)   Anemia    Acute left-sided weakness secondary to acute stroke involving left brain See MRI and MRA reports above. Suspect embolic stroke. Work up in progress. Neurology following. Will need to find out why she was taken off of Warfarin by Dr. Allyson Sabal. Will involve [facility] as well. Patient may need to go back on warfarin. Continue aspirin and Plavix for now. PT/OT. Patient passed swallow evaluation in the emergency department. Lipid panel and hemoglobin A1c pending.  Right ankle pain/cellulitis  Presumed to be from cellulitis. X-ray is unremarkable for acute process. Uric acid level is 5.3. Try alternative pain medications. Continue Keflex  to complete a course. If pain persists, may need MRI.  Leukocytosis  Likely due to cellulitis.   Anemia  Etiology unclear. Anemia panel pending.  Hypertension  Stable continue home medications.   History of A. Fib  Currently in sinus rhythm. Rate controlled.   Psoriasis  Management as per her dermatologist as outpatient.   Scleral icterus?  Liver function tests normal.   Prophylaxis  Lovenox   CODE STATUS  DO NOT RESUSCITATE/DO NOT INTUBATE. Discussed with the patient at the time of admission.      LOS: 1 day   [facility] Pager [phone] 12/08/2011, 11:49 AM

## 2011-12-08 NOTE — Progress Notes (Signed)
  Echocardiogram 2D Echocardiogram has been performed.  Sara [name] [facility] 12/08/2011, 10:18 AM

## 2011-12-08 NOTE — Progress Notes (Signed)
Pt. Complaining of decreased use of Left hand. Denies numbness/tingling sensation, no other deficits.  Left hand is swollen, nothing new from this am (beginning of shift). Pt. Stated that symptoms started about an hour before RN was notified.  Rounding MD made aware, stroke made aware, and rapid response notified.  Pt. VS checked/charted. Orders from stroke to start bolus and lye pt. Flat.  Pt currently stable. Rapid response following up.

## 2011-12-08 NOTE — Progress Notes (Signed)
Report received from [facility].  Study performed on 11-01-11 shows no evidence of significant ICA stenosis on the right and 50-69% stenosis on the left.  Left vertebral artery appeared occluded.

## 2011-12-08 NOTE — Progress Notes (Signed)
Clinical Social Work Department BRIEF PSYCHOSOCIAL ASSESSMENT 12/08/2011  Patient:  Sara [name],Sara [name]     Account Number:  [id number]     Admit date:  12/07/2011  Clinical Social Worker:  Dejan Angert, LCSW  Date/Time:  12/08/2011 01:30 PM  Referred by:  Physician  Date Referred:  12/08/2011 Referred for  Other - See comment   Other Referral:   Admitted from facility   Interview type:  Patient Other interview type:   grandson also involved in assessment    PSYCHOSOCIAL DATA Living Status:  FACILITY Admitted from facility:  [facility] Level of care:  Independent Living Primary support name:  Richard Primary support relationship to patient:  FAMILY Degree of support available:   Adequate    CURRENT CONCERNS Current Concerns  Post-Acute Placement   Other Concerns:    SOCIAL WORK ASSESSMENT / PLAN CSW received referral due to patient being admitted from [name]. CSW met with patient and grandson. Patient is from [facility] independent living. 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 Per patient and grandson report, patient is moving well. Patient reports she would prefer to return to ind. living but is open to other options depending on her needs. CSW will follow up after PT/OT evaluations.   Assessment/plan status:  Psychosocial Support/Ongoing Assessment of Needs Other assessment/ plan:   Information/referral to community resources:   SNF vs HH vs ind living    PATIENT'S/FAMILY'S RESPONSE TO PLAN OF CARE: Patient and grandson were very involved during assessment. Patient is agreeable to CSW following up after PT evaluation.         

## 2011-12-08 NOTE — Discharge Instructions (Signed)
STROKE/TIA DISCHARGE INSTRUCTIONS SMOKING Cigarette smoking nearly doubles your risk of having a stroke & is the single most alterable risk factor  If you smoke or have smoked in the last 12 months, you are advised to quit smoking for your health.  Most of the excess cardiovascular risk related to smoking disappears within a year of stopping.  Ask you doctor about anti-smoking medications  [place] Quit Line: 1-800-QUIT NOW  Free Smoking Cessation Classes [phone]  CHOLESTEROL Know your levels; limit fat & cholesterol in your diet  Lipid Panel     Component Value Date/Time   CHOL  Value: 140        ATP III CLASSIFICATION:  <200     mg/dL   Desirable  098-119  mg/dL   Borderline High  >=147    mg/dL   High        05/11/5620 0651   TRIG 41 12/27/2010 0651   HDL 42 12/27/2010 0651   CHOLHDL 3.3 12/27/2010 0651   VLDL 8 12/27/2010 0651   LDLCALC  Value: 90        Total Cholesterol/HDL:CHD Risk Coronary Heart Disease Risk Table                     Men   Women  1/2 Average Risk   3.4   3.3  Average Risk       5.0   4.4  2 X Average Risk   9.6   7.1  3 X Average Risk  23.4   11.0        Use the calculated Patient Ratio above and the CHD Risk Table to determine the patient's CHD Risk.        ATP III CLASSIFICATION (LDL):  <100     mg/dL   Optimal  308-657  mg/dL   Near or Above                    Optimal  130-159  mg/dL   Borderline  846-962  mg/dL   High  >952     mg/dL   Very High 8/41/3244 0102      Many patients benefit from treatment even if their cholesterol is at goal.  Goal: Total Cholesterol (CHOL) less than 160  Goal:  Triglycerides (TRIG) less than 150  Goal:  HDL greater than 40  Goal:  LDL (LDLCALC) less than 100   BLOOD PRESSURE American Stroke Association blood pressure target is less that 120/80 mm/Hg  Your discharge blood pressure is:  BP: 132/81 mmHg  Monitor your blood pressure  Limit your salt and alcohol intake  Many individuals will require more than one medication for  high blood pressure  DIABETES (A1c is a blood sugar average for last 3 months) Goal HGBA1c is under 7% (HBGA1c is blood sugar average for last 3 months)  Diabetes: {STROKE DC DIABETES:22357}    Lab Results  Component Value Date   HGBA1C  Value: 5.9 (NOTE)                                                                       According to the ADA Clinical Practice Recommendations for 2011, when HbA1c is used as a screening test:   >=  6.5%   Diagnostic of Diabetes Mellitus           (if abnormal result  is confirmed)  5.7-6.4%   Increased risk of developing Diabetes Mellitus  References:Diagnosis and Classification of Diabetes Mellitus,Diabetes Care,2011,34(Suppl 1):S62-S69 and Standards of Medical Care in         Diabetes - 2011,Diabetes Care,2011,34  (Suppl 1):S11-S61.* 12/27/2010     Your HGBA1c can be lowered with medications, healthy diet, and exercise.  Check your blood sugar as directed by your physician  Call your physician if you experience unexplained or low blood sugars.  PHYSICAL ACTIVITY/REHABILITATION Goal is 30 minutes at least 4 days per week    {STROKE DC ACTIVITY/REHAB:22359}  Activity decreases your risk of heart attack and stroke and makes your heart stronger.  It helps control your weight and blood pressure; helps you relax and can improve your mood.  Participate in a regular exercise program.  Talk with your doctor about the best form of exercise for you (dancing, walking, swimming, cycling).  DIET/WEIGHT Goal is to maintain a healthy weight  Your discharge diet is: General *** liquids Your height is:  Height: 5' 4.5" (163.8 cm) Your current weight is: Weight: 58.4 kg (128 lb 12 oz) Your Body Mass Index (BMI) is:  BMI (Calculated): 21.8   Following the type of diet specifically designed for you will help prevent another stroke.  Your goal weight range is:  ***  Your goal Body Mass Index (BMI) is 19-24.  Healthy food habits can help reduce 3 risk factors for stroke:   High cholesterol, hypertension, and excess weight.  RESOURCES Stroke/Support Group:  Call [phone]  they meet the 3rd Sunday of the month on the Rehab Unit at [facility], [place] ( no meetings June, July & Aug).  STROKE EDUCATION PROVIDED/REVIEWED AND GIVEN TO PATIENT Stroke warning signs and symptoms How to activate emergency medical system (call 911). Medications prescribed at discharge. Need for follow-up after discharge. Personal risk factors for stroke. Pneumonia vaccine given:   {STROKE DC YES/NO/DATE:22363} Flu vaccine given:   {STROKE DC YES/NO/DATE:22363} My questions have been answered, the writing is legible, and I understand these instructions.  I will adhere to these goals & educational materials that have been provided to me after my discharge from the hospital.

## 2011-12-08 NOTE — Progress Notes (Signed)
PT Cancellation Note  Treatment cancelled today due to medical issues with patient which prohibited therapy.  Pt with orders to lie flat in bed.  Sara [name] 12/08/2011, 2:12 PM

## 2011-12-08 NOTE — Consult Note (Signed)
Reason for Consult: Thoughts on anticoagulation Referring Physician:   [name] is an 78 y.o. female.  HPI: patient is a 78 year old frail-appearing Caucasian female she was last seen by Dr. Gery Pray on 08/11/2011. She's a history of hypertension, hyperlipidemia& involving the left side, paroxysmal atrial fibrillation carotid artery disease. She's had a stent placed to her right carotid artery in the past. She'll Myoview stress test approximately one year ago to your half ago which showed mild to moderate anterior ischemia and she was asymptomatic at that time. The patient declined consideration for left carotid endarterectomy. Patient new-onset A. Fib in February of 2012 she was admitted to the hospital at that time and ruled out for MI she was cathed by Dr. Royann Shivers during that hospital visit this revealed two-vessel disease with preserved LV function. Patient was considered not a good candidate for bypass surgery due to multiple comorbidities. Patient converted to sinus rhythm. Carotid Dopplers performed 04/23/2011 showed a widely patent stent. During the last office visit also there is a long discussion regarding aspirin Plavix and not starting Coumadin.  The patient presents to this hospital admission with left-sided embolic stroke.  We are asked to consult and give her thoughts regarding anticoagulation with Coumadin.  During the interview patient was complaining of decreased ability to grasp and control her left hand. She stated the nursing staff is aware. She does state that she gets a little short of breath and has some lower extremity edema which can be greater on the left side.  She denies any nausea, vomiting, chest pain, palpitations, diaphoresis, abdominal pain, dysuria, hematuria, hematochezia.  The patient also reports that she has fallen recently on a couple different occasions.  Past Medical History  Diagnosis Date  . Hypertension   . Atrial fibrillation   . Psoriasis   . Congestive  heart failure (CHF)   . Basal cell carcinoma   . History of CVA (cerebrovascular accident)   . Coronary artery disease   . Myocardial infarction   . CHF (congestive heart failure)   . Stroke   . Shortness of breath   . H/O hiatal hernia     Past Surgical History  Procedure Date  . Tonsillectomy   . Dilation and curettage of uterus   . Eye surgery   . Cataracts     Family History  Problem Relation Age of Onset  . Stomach cancer Mother   . Pneumonia Father     Social History:  reports that she has never smoked. She has never used smokeless tobacco. She reports that she does not drink alcohol or use illicit drugs.  Allergies:  Allergies  Allergen Reactions  . Penicillins Other (See Comments)    unknown  . Triamterene Other (See Comments)    unknown  . Fosamax Rash    Medications:     .  stroke: mapping our early stages of recovery book   Does not apply Once  . amLODipine  5 mg Oral Daily  . aspirin EC  162 mg Oral Daily  . atorvastatin  20 mg Oral q1800  . carvedilol  12.5 mg Oral BID WC  . cephALEXin  500 mg Oral Q12H  . clopidogrel  75 mg Oral Daily  . diltiazem  120 mg Oral Daily  . docusate sodium  100 mg Oral Daily  . enoxaparin  40 mg Subcutaneous Q24H  . folic acid  1 mg Oral Daily  . furosemide  40 mg Oral q morning - 10a  .  isosorbide-hydrALAZINE  0.5 tablet Oral BID  . lisinopril  20 mg Oral Daily  . methotrexate  2.5 mg Oral 3 times weekly  . omega-3 acid ethyl esters  2 g Oral Daily  . pantoprazole  40 mg Oral Daily  . potassium chloride SA  20 mEq Oral Daily  . vitamin C  500 mg Oral Daily     Results for orders placed during the hospital encounter of 12/07/11 (from the past 48 hour(s))  CBC     Status: Abnormal   Collection Time   12/07/11 10:12 AM      Component Value Range Comment   WBC 16.7 (*) 4.0 - 10.5 (K/uL)    RBC 3.75 (*) 3.87 - 5.11 (MIL/uL)    Hemoglobin 11.7 (*) 12.0 - 15.0 (g/dL)    HCT 81.1 (*) 91.4 - 46.0 (%)    MCV 91.5   78.0 - 100.0 (fL)    MCH 31.2  26.0 - 34.0 (pg)    MCHC 34.1  30.0 - 36.0 (g/dL)    RDW 78.2  95.6 - 21.3 (%)    Platelets 265  150 - 400 (K/uL)   DIFFERENTIAL     Status: Abnormal   Collection Time   12/07/11 10:12 AM      Component Value Range Comment   Neutrophils Relative 34 (*) 43 - 77 (%)    Lymphocytes Relative 58 (*) 12 - 46 (%)    Monocytes Relative 5  3 - 12 (%)    Eosinophils Relative 2  0 - 5 (%)    Basophils Relative 1  0 - 1 (%)    Neutro Abs 5.7  1.7 - 7.7 (K/uL)    Lymphs Abs 9.7 (*) 0.7 - 4.0 (K/uL)    Monocytes Absolute 0.8  0.1 - 1.0 (K/uL)    Eosinophils Absolute 0.3  0.0 - 0.7 (K/uL)    Basophils Absolute 0.2 (*) 0.0 - 0.1 (K/uL)    WBC Morphology ATYPICAL LYMPHOCYTES     BASIC METABOLIC PANEL     Status: Abnormal   Collection Time   12/07/11 10:12 AM      Component Value Range Comment   Sodium 136  135 - 145 (mEq/L)    Potassium 3.9  3.5 - 5.1 (mEq/L)    Chloride 99  96 - 112 (mEq/L)    CO2 28  19 - 32 (mEq/L)    Glucose, Bld 92  70 - 99 (mg/dL)    BUN 21  6 - 23 (mg/dL)    Creatinine, Ser 0.86  0.50 - 1.10 (mg/dL)    Calcium 9.5  8.4 - 10.5 (mg/dL)    GFR calc non Af Amer 73 (*) >90 (mL/min)    GFR calc Af Amer 85 (*) >90 (mL/min)   URINALYSIS, ROUTINE W REFLEX MICROSCOPIC     Status: Normal   Collection Time   12/07/11 11:36 AM      Component Value Range Comment   Color, Urine YELLOW  YELLOW     APPearance CLEAR  CLEAR     Specific Gravity, Urine 1.008  1.005 - 1.030     pH 6.5  5.0 - 8.0     Glucose, UA NEGATIVE  NEGATIVE (mg/dL)    Hgb urine dipstick NEGATIVE  NEGATIVE     Bilirubin Urine NEGATIVE  NEGATIVE     Ketones, ur NEGATIVE  NEGATIVE (mg/dL)    Protein, ur NEGATIVE  NEGATIVE (mg/dL)    Urobilinogen, UA 0.2  0.0 - 1.0 (  mg/dL)    Nitrite NEGATIVE  NEGATIVE     Leukocytes, UA NEGATIVE  NEGATIVE  MICROSCOPIC NOT DONE ON URINES WITH NEGATIVE PROTEIN, BLOOD, LEUKOCYTES, NITRITE, OR GLUCOSE <1000 mg/dL.  URIC ACID     Status: Normal    Collection Time   12/08/11  6:05 AM      Component Value Range Comment   Uric Acid, Serum 5.3  2.4 - 7.0 (mg/dL)   CBC     Status: Abnormal   Collection Time   12/08/11  6:05 AM      Component Value Range Comment   WBC 15.8 (*) 4.0 - 10.5 (K/uL)    RBC 3.54 (*) 3.87 - 5.11 (MIL/uL)    Hemoglobin 10.8 (*) 12.0 - 15.0 (g/dL)    HCT 40.9 (*) 81.1 - 46.0 (%)    MCV 92.4  78.0 - 100.0 (fL)    MCH 30.5  26.0 - 34.0 (pg)    MCHC 33.0  30.0 - 36.0 (g/dL)    RDW 91.4  78.2 - 95.6 (%)    Platelets 249  150 - 400 (K/uL)   COMPREHENSIVE METABOLIC PANEL     Status: Abnormal   Collection Time   12/08/11  6:05 AM      Component Value Range Comment   Sodium 142  135 - 145 (mEq/L)    Potassium 3.8  3.5 - 5.1 (mEq/L)    Chloride 107  96 - 112 (mEq/L) DELTA CHECK NOTED   CO2 27  19 - 32 (mEq/L)    Glucose, Bld 85  70 - 99 (mg/dL)    BUN 17  6 - 23 (mg/dL)    Creatinine, Ser 2.13  0.50 - 1.10 (mg/dL)    Calcium 9.0  8.4 - 10.5 (mg/dL)    Total Protein 5.7 (*) 6.0 - 8.3 (g/dL)    Albumin 3.1 (*) 3.5 - 5.2 (g/dL)    AST 23  0 - 37 (U/L)    ALT 20  0 - 35 (U/L)    Alkaline Phosphatase 46  39 - 117 (U/L)    Total Bilirubin 0.3  0.3 - 1.2 (mg/dL)    GFR calc non Af Amer 73 (*) >90 (mL/min)    GFR calc Af Amer 85 (*) >90 (mL/min)   VITAMIN B12     Status: Normal   Collection Time   12/08/11  6:05 AM      Component Value Range Comment   Vitamin B-12 322  211 - 911 (pg/mL)   FOLATE     Status: Normal   Collection Time   12/08/11  6:05 AM      Component Value Range Comment   Folate >20.0     IRON AND TIBC     Status: Abnormal   Collection Time   12/08/11  6:05 AM      Component Value Range Comment   Iron 54  42 - 135 (ug/dL)    TIBC 086  578 - 469 (ug/dL)    Saturation Ratios 18 (*) 20 - 55 (%)    UIBC 252  125 - 400 (ug/dL)   FERRITIN     Status: Normal   Collection Time   12/08/11  6:05 AM      Component Value Range Comment   Ferritin 18  10 - 291 (ng/mL)   RETICULOCYTES     Status:  Abnormal   Collection Time   12/08/11  6:05 AM      Component Value Range Comment  Retic Ct Pct 0.8  0.4 - 3.1 (%)    RBC. 3.54 (*) 3.87 - 5.11 (MIL/uL)    Retic Count, Manual 28.3  19.0 - 186.0 (K/uL)   LIPID PANEL     Status: Normal   Collection Time   12/08/11  6:05 AM      Component Value Range Comment   Cholesterol 144  0 - 200 (mg/dL)    Triglycerides 86  <956 (mg/dL)    HDL 44  >21 (mg/dL)    Total CHOL/HDL Ratio 3.3      VLDL 17  0 - 40 (mg/dL)    LDL Cholesterol 83  0 - 99 (mg/dL)     Dg Chest 2 View  11/18/6576  *RADIOLOGY REPORT*  Clinical Data: Status post stroke  CHEST - 2 VIEW  Comparison: 03/01/2011  Findings:  Calcified atherosclerotic disease affects the thoracic aorta.  There is mild cardiac enlargement.  There is no pleural effusion or edema.  Lungs are hyperinflated and there are coarsened interstitial markings identified bilaterally.  No airspace consolidation.  Compression deformity is noted within the lower thoracic spine. This has been treated with bone cement.  IMPRESSION:  1.  Cardiac enlargement. 2.  No heart failure.  Original Report Authenticated By: Rosealee Albee, M.D.   Dg Ankle 2 Views Right  12/07/2011  *RADIOLOGY REPORT*  Clinical Data: Right ankle pain, cellulitis  RIGHT ANKLE - 2 VIEW  Comparison: None.  Findings: No fracture or dislocation is seen.  The ankle mortise is intact.  The base of the fifth metatarsal is unremarkable.  Plantar and posterior calcaneal enthesopathy.  Very mild lateral soft tissue swelling.  No underlying osseous abnormality.  IMPRESSION: No acute osseous abnormality is seen.  Mild lateral soft tissue swelling.  Original Report Authenticated By: Charline Bills, M.D.   Ct Head Wo Contrast  12/07/2011  *RADIOLOGY REPORT*  Clinical Data: New onset of left lower extremity weakness and left- sided facial numbness.  CT HEAD WITHOUT CONTRAST  Technique:  Contiguous axial images were obtained from the base of the skull through the  vertex without contrast.  Comparison: 11/27/2009 CT.  Findings: No intracranial hemorrhage.  Prominent small vessel disease type changes.  Remote small infarcts right caudate head and right thalamus.  Questionable early acute right periopercular infarct.  No intracranial mass lesion detected on this unenhanced exam.  Global atrophy without hydrocephalus.  Vascular calcifications.  IMPRESSION: No intracranial hemorrhage.  Questionable early acute right periopercular infarct.  Remote right thalamic and right caudate head infarct.  Results called to Dr. Weldon Inches.  Original Report Authenticated By: Fuller Canada, M.D.   Mr Brain Wo Contrast  12/08/2011  *RADIOLOGY REPORT*  Clinical Data:  New onset of left lower extremity weakness and left- sided facial numbness.  MRI HEAD WITHOUT CONTRAST MRA HEAD WITHOUT CONTRAST  Technique: Multiplanar, multiecho pulse sequences of the brain and surrounding structures were obtained according to standard protocol without intravenous contrast.  Angiographic images of the head were obtained using MRA technique without contrast.  Comparison: 12/07/2011 CT.  12/20/2007 MR.  MRI HEAD  Findings:  Small acute infarcts superior right frontal lobe (anterior and posterior aspect).  Tiny acute infarct anterior limb right internal capsule superior aspect.  Tiny acute infarcts mid right cerebellum.  Involvement of different vascular distributions raises possibility of embolic disease.  No intracranial hemorrhage.  Prominent small vessel disease type changes.  Remote infarct at the junction of the right thalamus and posterior limb of the right internal  capsule.  Remote tiny cerebellar infarcts.  No intracranial mass lesion noted on this unenhanced exam.  Global mild atrophy without hydrocephalus.  Transverse ligament hypertrophy.  Minimal mucosal thickening inferior aspect right maxillary sinus.  IMPRESSION: Small acute infarcts superior right frontal lobe (anterior and posterior aspect).   Tiny acute infarct anterior limb right internal capsule superior aspect.  Tiny acute infarcts mid right cerebellum. Involvement of different vascular distributions raises possibility of embolic disease.  No intracranial hemorrhage.  Prominent small vessel disease type changes.  MRA HEAD  Findings: High-grade focal stenosis distal A1 segment left anterior cerebral artery.  Middle cerebral artery mild branch vessel irregularity bilaterally.  Abnormal appearance left vertebral artery which may be occluded with retrograde flow filling the distal aspect.  Ectatic right vertebral artery and basilar artery without high- grade stenosis.  PICAs not completely imaged on the present exam.  Posterior cerebral artery mild to moderate branch vessel irregularity.  No aneurysm or vascular malformation noted.  IMPRESSION: High-grade focal stenosis distal A1 segment left anterior cerebral artery.  Middle cerebral artery mild branch vessel irregularity bilaterally.  Abnormal appearance left vertebral artery which may be occluded with retrograde flow filling the distal aspect.  Posterior cerebral artery mild to moderate branch vessel irregularity.  Original Report Authenticated By: Fuller Canada, M.D.   Mr Mra Head/brain Wo Cm  12/08/2011  *RADIOLOGY REPORT*  Clinical Data:  New onset of left lower extremity weakness and left- sided facial numbness.  MRI HEAD WITHOUT CONTRAST MRA HEAD WITHOUT CONTRAST  Technique: Multiplanar, multiecho pulse sequences of the brain and surrounding structures were obtained according to standard protocol without intravenous contrast.  Angiographic images of the head were obtained using MRA technique without contrast.  Comparison: 12/07/2011 CT.  12/20/2007 MR.  MRI HEAD  Findings:  Small acute infarcts superior right frontal lobe (anterior and posterior aspect).  Tiny acute infarct anterior limb right internal capsule superior aspect.  Tiny acute infarcts mid right cerebellum.  Involvement of  different vascular distributions raises possibility of embolic disease.  No intracranial hemorrhage.  Prominent small vessel disease type changes.  Remote infarct at the junction of the right thalamus and posterior limb of the right internal capsule.  Remote tiny cerebellar infarcts.  No intracranial mass lesion noted on this unenhanced exam.  Global mild atrophy without hydrocephalus.  Transverse ligament hypertrophy.  Minimal mucosal thickening inferior aspect right maxillary sinus.  IMPRESSION: Small acute infarcts superior right frontal lobe (anterior and posterior aspect).  Tiny acute infarct anterior limb right internal capsule superior aspect.  Tiny acute infarcts mid right cerebellum. Involvement of different vascular distributions raises possibility of embolic disease.  No intracranial hemorrhage.  Prominent small vessel disease type changes.  MRA HEAD  Findings: High-grade focal stenosis distal A1 segment left anterior cerebral artery.  Middle cerebral artery mild branch vessel irregularity bilaterally.  Abnormal appearance left vertebral artery which may be occluded with retrograde flow filling the distal aspect.  Ectatic right vertebral artery and basilar artery without high- grade stenosis.  PICAs not completely imaged on the present exam.  Posterior cerebral artery mild to moderate branch vessel irregularity.  No aneurysm or vascular malformation noted.  IMPRESSION: High-grade focal stenosis distal A1 segment left anterior cerebral artery.  Middle cerebral artery mild branch vessel irregularity bilaterally.  Abnormal appearance left vertebral artery which may be occluded with retrograde flow filling the distal aspect.  Posterior cerebral artery mild to moderate branch vessel irregularity.  Original Report Authenticated By: Fuller Canada, M.D.  Review of Systems  Constitutional: Negative for fever.  HENT: Negative for congestion and sore throat.   Respiratory: Positive for shortness of breath.  Negative for cough.   Cardiovascular: Positive for leg swelling (sometimes greater on the left.). Negative for chest pain, orthopnea, claudication and PND.  Gastrointestinal: Negative for nausea, vomiting, abdominal pain, blood in stool and melena.  Genitourinary: Negative for dysuria and hematuria.  Neurological: Positive for focal weakness (Pt is complaining of inability to grasp utensils.) and weakness (left side).   Blood pressure 108/52, pulse 68, temperature 98.5 F (36.9 C), temperature source Oral, resp. rate 20, height 5' 4.5" (1.638 m), weight 58.4 kg (128 lb 12 oz), SpO2 99.00%. Physical Exam  Constitutional: She is oriented to person, place, and time.       Frail appearing 78 yo female.  HENT:  Head: Normocephalic and atraumatic.  Eyes: Pupils are equal, round, and reactive to light.       Ocular movements to the left decreased  Neck: Normal range of motion. Neck supple. No JVD present.  Cardiovascular: Normal rate and regular rhythm.   Murmur (2/6 soft systolic MM ) heard. Pulses:      Carotid pulses are on the right side with bruit, and on the left side with bruit.      Radial pulses are 2+ on the right side, and 2+ on the left side.       Dorsalis pedis pulses are 2+ on the right side, and 1+ on the left side.  Respiratory: Effort normal and breath sounds normal. She has no wheezes. She has no rales.  GI: Soft. Bowel sounds are normal. There is no tenderness.  Lymphadenopathy:    She has no cervical adenopathy.  Neurological: She is alert and oriented to person, place, and time. She exhibits abnormal muscle tone (Decreased muscle tone on the left side.  Decreased thumb to finger coordination, rate on the left compared to the right.).  Skin: Skin is warm and dry.  Psychiatric: She has a normal mood and affect.   2D Echo 3/27: Normal LV size and systolic function, EF 55-60% with moderate LV hypertrophy. Moderate diastolic dysfunction with evidence for elevated LV filling  pressure. Narrow LV outflow tract. Moderate calcified aortic valve with mild aortic stenosis. Normal RV size and systolic function with mild pulmonary hypertension.   Assessment/Plan: Principal Problem:  *Stroke, acute, embolic Active Problems:  Acute left-sided weakness  Atrial fibrillation  Ankle cellulitis  Leukocytosis  Hypertension  Psoriasis  Chronic diastolic HF (heart failure)  Anemia  Basal cell carcinoma  Plan:  78 year old frail, Caucasian female with history of recent falls, coronary artery disease, peripheral artery disease, stroke, PAF, hypertension, hyperlipidemia. Presents with left-sided embolic stroke. She been treated with aspirin and Plavix for PAF.  Treatment with coumadin would required full-time supervision in order to prevent falls and subsequent bleeding.   MD to Follow.    HAGER,BRYAN W 12/08/2011, 2:11 PM   I have seen & examined the patient along with Mr. Leron Croak, [place].  I agree with his findings, assessment and plan. I reviewed the chart and the results. Have also pointed out her most recent clinic note for Dr. Allyson Sabal. She has no history of stroke and status post left carotid stenting. Now presents with left-sided stroke. She does have a history of atrial fibrillation is no evidence of atrial ablation during this hospitalization. Prior to this hospitalization, after long discussion Dr. Allyson Sabal decided that her risk of falling was too great to outweigh  the benefits of warfarin therapy. Therefore she is continued on aspirin Plavix which would be for carotid stenting as well.  Her current event seems to be affecting only her left arm and hand. This would not in any way change her final disposition as far as her ability to get up and about. She will remain unsteady with a significant fall risk.  This is a changing to warfarin as a philosophical decision which has been well thought out in the past. Is no clear evidence of atrial fibrillation with thromboembolism from left  atrium is the cause of the stroke. We'll need to investigate the carotid stent which was nondominant MRI/MRA. Would consider carotid Dopplers to evaluate flow through the stent as thromboembolism from this location is also possible. For now moderate additionally continue with aspirin Plavix as per Dr. Hazle Coca initial decision and neurology. I will as a very severe she still here on Friday when he is in the Hospital and can provide his insight.  Marykay Lex, M.D., M.S. [facility] [address] [place], [place]  [address]  [phone] Pager # [phone]  12/08/2011 5:11 PM

## 2011-12-08 NOTE — Progress Notes (Signed)
Stroke Team Progress Note  HISTORY Sara [name] is an 78 y.o. female who reports that last night she developed numbness on the left side of her face and in her left foot. The patient went to sleep and when she awakened this morning continued to have the complaints. On further questioning it also seems that she was having difficulty using her left foot and feels that her left arm is a little weak as well. Imaging negative for ankle fx. Not a candidate for tPA as she was outside the window. She has a history of atrial fibrillation. She was not on coumadin prior to admission due to fall risk.  SUBJECTIVE Her biggest complaint is related to ankle pain.  OBJECTIVE Filed Vitals:   12/08/11 0200 12/08/11 0359 12/08/11 0602 12/08/11 1104  BP: 149/50 161/66 132/81 140/51  Pulse: 69 71 72 64  Temp: 97.8 F (36.6 C) 98.5 F (36.9 C) 98.5 F (36.9 C) 98.2 F (36.8 C)  TempSrc: Oral Oral Oral Oral  Resp: 18 18 18 20   Height:      Weight:      SpO2: 98% 97% 97% 97%   CBG (last 3) No results found for this basename: GLUCAP:3 in the last 72 hours Intake/Output from previous day: 03/26 0701 - 03/27 0700 In: 480 [P.O.:480] Out: -   IV Fluid Intake:      . sodium chloride    . DISCONTD: sodium chloride 125 mL/hr at 12/07/11 1013   Medications     .  stroke: mapping our early stages of recovery book   Does not apply Once  . amLODipine  5 mg Oral Daily  . aspirin EC  162 mg Oral Daily  . atorvastatin  20 mg Oral q1800  . carvedilol  12.5 mg Oral BID WC  . cephALEXin  500 mg Oral Q12H  . clopidogrel  75 mg Oral Daily  . diltiazem  120 mg Oral Daily  . docusate sodium  100 mg Oral Daily  . enoxaparin  40 mg Subcutaneous Q24H  . folic acid  1 mg Oral Daily  . furosemide  40 mg Oral q morning - 10a  . isosorbide-hydrALAZINE  0.5 tablet Oral BID  . lisinopril  20 mg Oral Daily  . methotrexate  2.5 mg Oral 3 times weekly  . omega-3 acid ethyl esters  2 g Oral Daily  . pantoprazole  40 mg  Oral Daily  . potassium chloride SA  20 mEq Oral Daily  . vitamin C  500 mg Oral Daily  . DISCONTD: aspirin  300 mg Rectal Daily  . DISCONTD: aspirin  325 mg Oral Daily  . DISCONTD: diltiazem  120 mg Oral Daily  PRN acetaminophen, acetaminophen, senna-docusate  Diet:  General thin liquids Activity:  Bedrest  DVT Prophylaxis:  Lovenox 40 mg sq daily   Significant Diagnostic Studies: CBC    Component Value Date/Time   WBC 15.8* 12/08/2011 0605   RBC 3.54* 12/08/2011 0605   HGB 10.8* 12/08/2011 0605   HCT 32.7* 12/08/2011 0605   PLT 249 12/08/2011 0605   MCV 92.4 12/08/2011 0605   MCH 30.5 12/08/2011 0605   MCHC 33.0 12/08/2011 0605   RDW 14.5 12/08/2011 0605   LYMPHSABS 9.7* 12/07/2011 1012   MONOABS 0.8 12/07/2011 1012   EOSABS 0.3 12/07/2011 1012   BASOSABS 0.2* 12/07/2011 1012   CMP    Component Value Date/Time   NA 142 12/08/2011 0605   K 3.8 12/08/2011 0605   CL 107  12/08/2011 0605   CO2 27 12/08/2011 0605   GLUCOSE 85 12/08/2011 0605   BUN 17 12/08/2011 0605   CREATININE 0.73 12/08/2011 0605   CALCIUM 9.0 12/08/2011 0605   PROT 5.7* 12/08/2011 0605   ALBUMIN 3.1* 12/08/2011 0605   AST 23 12/08/2011 0605   ALT 20 12/08/2011 0605   ALKPHOS 46 12/08/2011 0605   BILITOT 0.3 12/08/2011 0605   GFRNONAA 73* 12/08/2011 0605   GFRAA 85* 12/08/2011 0605   COAGS Lab Results  Component Value Date   INR 1.18 12/27/2010   INR 0.97 12/27/2010   INR 1.03 11/06/2010   Lipid Panel    Component Value Date/Time   CHOL 144 12/08/2011 0605   TRIG 86 12/08/2011 0605   HDL 44 12/08/2011 0605   CHOLHDL 3.3 12/08/2011 0605   VLDL 17 12/08/2011 0605   LDLCALC 83 12/08/2011 0605   HgbA1C  Lab Results  Component Value Date   HGBA1C  Value: 5.9 (NOTE)                                                                       According to the ADA Clinical Practice Recommendations for 2011, when HbA1c is used as a screening test:   >=6.5%   Diagnostic of Diabetes Mellitus           (if abnormal result  is confirmed)   5.7-6.4%   Increased risk of developing Diabetes Mellitus  References:Diagnosis and Classification of Diabetes Mellitus,Diabetes Care,2011,34(Suppl 1):S62-S69 and Standards of Medical Care in         Diabetes - 2011,Diabetes Care,2011,34  (Suppl 1):S11-S61.* 12/27/2010   Urine Drug Screen  No results found for this basename: labopia,  cocainscrnur,  labbenz,  amphetmu,  thcu,  labbarb    Alcohol Level No results found for this basename: eth    Dg Ankle 2 Views Right 12/07/2011   No acute osseous abnormality is seen.  Mild lateral soft tissue swelling.    CT of the brain  12/07/2011  No intracranial hemorrhage.  Questionable early acute right periopercular infarct.  Remote right thalamic and right caudate head infarct.   MRI of the brain  12/08/2011   Small acute infarcts superior right frontal lobe (anterior and posterior aspect).  Tiny acute infarct anterior limb right internal capsule superior aspect.  Tiny acute infarcts mid right cerebellum. Involvement of different vascular distributions raises possibility of embolic disease.  No intracranial hemorrhage.  Prominent small vessel disease type changes.   MRA of the brain  12/08/2011  High-grade focal stenosis distal A1 segment left anterior cerebral artery.  Middle cerebral artery mild branch vessel irregularity bilaterally.  Abnormal appearance left vertebral artery which may be occluded with retrograde flow filling the distal aspect.  Posterior cerebral artery mild to moderate branch vessel irregularity.  .  2D Echocardiogram  ordered   Carotid Doppler   11-01-11 no evidence of significant ICA stenosis on the right and 50-69% stenosis on the left. Left vertebral artery appeared occluded.  CXR  12/07/2011   1.  Cardiac enlargement. 2.  No heart failure.   EKG  normal sinus rhythm.   Physical Exam   Frail elderly Caucasian lady currently not in distress. Afebrile. Head is not dramatic. Hearing is  mildly decreased bilaterally. Neck is supple  with soft carotid bruit. Cardiac exam no murmur or gallop. Lungs clear to auscultation. Distal pulses well felt. Right ankle is swollen and tender to touch. Neurological exam Awake alert oriented x 3 normal speech and language. Mild left lower face asymmetry. Tongue midline. No drift. Mild diminished fine finger movements on left. Orbits right over left upper extremity. Mild left grip weak.. Normal sensation . Normal coordination.  ASSESSMENT Ms. BRIANNE MAINA is a 78 y.o. female with small acute infarcts superior right frontal lobe (anterior and posterior aspect), tiny acute infarct anterior limb right internal capsule superior aspect and tiny acute infarcts mid right cerebellum,  secondary to atrial fibrillation. On aspirin 81 mg orally every day and clopidogrel 75 mg orally every day prior admission. Now on aspirin 162 mg orally every day and clopidogrel 75 mg orally every day for secondary stroke prevention.  -atrial fibrillation -hypertension -basal cell carcinoma  Hospital day # 1  TREATMENT/PLAN - Continue aspirin 81 mg orally every day and clopidogrel 75 mg orally every day for secondary stroke prevention. No benefit of increasing ASA  - will follow up 2D -therapy evals when ok to be OOB -not a coumadin candidate secondary to fall risk  SHARON BIBY, AVNP, ANP-BC, GNP-BC [facility] Pager: [phone] 12/08/2011 11:42 AM  Dr. Delia Heady, Stroke Center Medical Director, has personally reviewed chart, pertinent data, examined the patient and developed the plan of care.

## 2011-12-08 NOTE — Progress Notes (Signed)
Pt. VS remain stable, pt under no s/s distress. Pt. Still complains of decrease usage of Left hand, swelling has decreased.  MD made aware. Will continue to monitor.

## 2011-12-08 NOTE — Progress Notes (Signed)
OT Cancellation Note  Treatment cancelled today due to pt. off the floor for test. Will re-attempt as time allows.Marland Kitchen  Darrol Brandenburg, OTR/L Pager [phone] 12/08/2011, 12:40 PM

## 2011-12-09 ENCOUNTER — Telehealth: Payer: Self-pay | Admitting: *Deleted

## 2011-12-09 LAB — CBC
HCT: 33.9 % — ABNORMAL LOW (ref 36.0–46.0)
RBC: 3.63 MIL/uL — ABNORMAL LOW (ref 3.87–5.11)
RDW: 14.7 % (ref 11.5–15.5)
WBC: 15.2 10*3/uL — ABNORMAL HIGH (ref 4.0–10.5)

## 2011-12-09 LAB — BASIC METABOLIC PANEL
BUN: 16 mg/dL (ref 6–23)
Chloride: 106 mEq/L (ref 96–112)
GFR calc Af Amer: 84 mL/min — ABNORMAL LOW (ref >90)
Potassium: 3.8 mEq/L (ref 3.5–5.1)

## 2011-12-09 MED ORDER — GABAPENTIN 100 MG PO CAPS
100.0000 mg | ORAL_CAPSULE | Freq: Every day | ORAL | Status: DC
  Administered 2011-12-09: 100 mg via ORAL
  Filled 2011-12-09 (×2): qty 1

## 2011-12-09 MED ORDER — POLYSACCHARIDE IRON COMPLEX 150 MG PO CAPS
150.0000 mg | ORAL_CAPSULE | Freq: Every day | ORAL | Status: DC
  Administered 2011-12-09 – 2011-12-10 (×2): 150 mg via ORAL
  Filled 2011-12-09 (×3): qty 1

## 2011-12-09 NOTE — Progress Notes (Signed)
CSW spoke with patient regarding PT recommending SNF before returning to independent living. Patient was agreeable to SNF. Patient prefers to go to SNF at [facility] but will also go to [facility] if needed. CSW completed FL2 and faxed out to these facilities. CSW submitted for pasarr. CSW called and left a message regarding patient admission to SNF at [facility]. CSW will continue to follow. [place], [place] [phone]

## 2011-12-09 NOTE — Progress Notes (Signed)
PCP: Londell Moh, MD, MD  Brief HPI:  OREOLUWA [name] is a 78 y.o. pleasant caucasian female history of CVA, A. fib currently in sinus rhythm, chronic diastolic congestive heart failure compensated, hypertension, psoriasis, hyperlipidemia, and recent right ankle cellulitis on day 8 of 10 of Keflex who presented with the above complaints. Patient reported that she has had 2 strokes in the past. Around 1130 p.m of the night before admission she noted that she had left-sided weakness with increasing numbness over her left lip. She also noted that she was dragging her left foot but thought that may have been due to wet floor. She woke up the morning of admission around 730 a.m. and noted left-sided weakness with persistent increased numbness of the left lip. She presented to the emergency department for further evaluation. She denied any recent fevers, chills, nausea, vomiting, chest pain, shortness of breath, abdominal pain, headaches or vision changes. Patient reported that she has been having right ankle pain for at least 3 weeks now and has not improved. She has been to a dermatologist recently for evaluation of lesion on her left shin and also for her cellulitis of right ankle for which she is taking Keflex.   Past medical history:  Past Medical History   Diagnosis  Date   .  Hypertension    .  Atrial fibrillation    .  Psoriasis    .  Congestive heart failure (CHF)    .  Basal cell carcinoma    .  History of CVA (cerebrovascular accident)      Consultants: Neurology  Procedures: None so far  Subjective: Reviewed notes from yesterday. Patient experienced increased weakness in left arm. Now she is better. Says she also bumped her left hand and has a bruise. Pain in ankle is mainly at night time. Describes as a sharp pain.  Objective: Vital signs in last 24 hours: Temp:  [97.3 F (36.3 C)-98.9 F (37.2 C)] 97.8 F (36.6 C) (03/28 1023) Pulse Rate:  [61-71] 61  (03/28  1023) Resp:  [18-20] 18  (03/28 1023) BP: (94-143)/(48-80) 136/53 mmHg (03/28 1023) SpO2:  [90 %-99 %] 97 % (03/28 1023) Weight:  [62.9 kg (138 lb 10.7 oz)] 62.9 kg (138 lb 10.7 oz) (03/27 1813) Weight change: 4.5 kg (9 lb 14.7 oz) Last BM Date: 12/07/11  Intake/Output from previous day: 03/27 0701 - 03/28 0700 In: 900 [P.O.:900] Out: -  Intake/Output this shift: Total I/O In: 480 [P.O.:480] Out: -   General appearance: alert, cooperative, appears stated age and no distress Head: Normocephalic, without obvious abnormality, atraumatic Eyes: conjunctivae/corneas clear. PERRL, EOM's intact. Neck: no adenopathy, no carotid bruit, no JVD, supple, symmetrical, trachea midline and thyroid not enlarged, symmetric, no tenderness/mass/nodules Resp: clear to auscultation bilaterally Cardio: regular rate and rhythm, S1, S2 normal, systolic murmur over precordium GI: soft, non-tender; bowel sounds normal; no masses,  no organomegaly Extremities: No swelling over right ankle. Mild erythema. Pulses present. Limited ROM. Mild tenderness to palpation. Left hand shows bruising. No limitation in ROM at wrist. Pulses: 2+ and symmetric Skin: mild erythema over right ankle Lymph nodes: Cervical, supraclavicular, and axillary nodes normal. Neurologic: Left hemiparesis.  Lab Results:  [facility] 12/09/11 0612 12/08/11 0605  WBC 15.2* 15.8*  HGB 11.1* 10.8*  HCT 33.9* 32.7*  PLT 243 249   BMET  Basename 12/09/11 0612 12/08/11 0605  NA 139 142  K 3.8 3.8  CL 106 107  CO2 25 27  GLUCOSE 87 85  BUN 16 17  CREATININE 0.74 0.73  CALCIUM 8.8 9.0  ALT -- 20    Studies/Results: Dg Chest 2 View  12/07/2011  *RADIOLOGY REPORT*  Clinical Data: Status post stroke  CHEST - 2 VIEW  Comparison: 03/01/2011  Findings:  Calcified atherosclerotic disease affects the thoracic aorta.  There is mild cardiac enlargement.  There is no pleural effusion or edema.  Lungs are hyperinflated and there are coarsened  interstitial markings identified bilaterally.  No airspace consolidation.  Compression deformity is noted within the lower thoracic spine. This has been treated with bone cement.  IMPRESSION:  1.  Cardiac enlargement. 2.  No heart failure.  Original Report Authenticated By: Rosealee Albee, M.D.   Dg Ankle 2 Views Right  12/07/2011  *RADIOLOGY REPORT*  Clinical Data: Right ankle pain, cellulitis  RIGHT ANKLE - 2 VIEW  Comparison: None.  Findings: No fracture or dislocation is seen.  The ankle mortise is intact.  The base of the fifth metatarsal is unremarkable.  Plantar and posterior calcaneal enthesopathy.  Very mild lateral soft tissue swelling.  No underlying osseous abnormality.  IMPRESSION: No acute osseous abnormality is seen.  Mild lateral soft tissue swelling.  Original Report Authenticated By: Charline Bills, M.D.   Mr Brain Wo Contrast  12/08/2011  *RADIOLOGY REPORT*  Clinical Data:  New onset of left lower extremity weakness and left- sided facial numbness.  MRI HEAD WITHOUT CONTRAST MRA HEAD WITHOUT CONTRAST  Technique: Multiplanar, multiecho pulse sequences of the brain and surrounding structures were obtained according to standard protocol without intravenous contrast.  Angiographic images of the head were obtained using MRA technique without contrast.  Comparison: 12/07/2011 CT.  12/20/2007 MR.  MRI HEAD  Findings:  Small acute infarcts superior right frontal lobe (anterior and posterior aspect).  Tiny acute infarct anterior limb right internal capsule superior aspect.  Tiny acute infarcts mid right cerebellum.  Involvement of different vascular distributions raises possibility of embolic disease.  No intracranial hemorrhage.  Prominent small vessel disease type changes.  Remote infarct at the junction of the right thalamus and posterior limb of the right internal capsule.  Remote tiny cerebellar infarcts.  No intracranial mass lesion noted on this unenhanced exam.  Global mild atrophy  without hydrocephalus.  Transverse ligament hypertrophy.  Minimal mucosal thickening inferior aspect right maxillary sinus.  IMPRESSION: Small acute infarcts superior right frontal lobe (anterior and posterior aspect).  Tiny acute infarct anterior limb right internal capsule superior aspect.  Tiny acute infarcts mid right cerebellum. Involvement of different vascular distributions raises possibility of embolic disease.  No intracranial hemorrhage.  Prominent small vessel disease type changes.  MRA HEAD  Findings: High-grade focal stenosis distal A1 segment left anterior cerebral artery.  Middle cerebral artery mild branch vessel irregularity bilaterally.  Abnormal appearance left vertebral artery which may be occluded with retrograde flow filling the distal aspect.  Ectatic right vertebral artery and basilar artery without high- grade stenosis.  PICAs not completely imaged on the present exam.  Posterior cerebral artery mild to moderate branch vessel irregularity.  No aneurysm or vascular malformation noted.  IMPRESSION: High-grade focal stenosis distal A1 segment left anterior cerebral artery.  Middle cerebral artery mild branch vessel irregularity bilaterally.  Abnormal appearance left vertebral artery which may be occluded with retrograde flow filling the distal aspect.  Posterior cerebral artery mild to moderate branch vessel irregularity.  Original Report Authenticated By: Fuller Canada, M.D.   Mr Mra Head/brain Wo Cm  12/08/2011  *RADIOLOGY REPORT*  Clinical Data:  New onset of left lower extremity weakness and left- sided facial numbness.  MRI HEAD WITHOUT CONTRAST MRA HEAD WITHOUT CONTRAST  Technique: Multiplanar, multiecho pulse sequences of the brain and surrounding structures were obtained according to standard protocol without intravenous contrast.  Angiographic images of the head were obtained using MRA technique without contrast.  Comparison: 12/07/2011 CT.  12/20/2007 MR.  MRI HEAD  Findings:   Small acute infarcts superior right frontal lobe (anterior and posterior aspect).  Tiny acute infarct anterior limb right internal capsule superior aspect.  Tiny acute infarcts mid right cerebellum.  Involvement of different vascular distributions raises possibility of embolic disease.  No intracranial hemorrhage.  Prominent small vessel disease type changes.  Remote infarct at the junction of the right thalamus and posterior limb of the right internal capsule.  Remote tiny cerebellar infarcts.  No intracranial mass lesion noted on this unenhanced exam.  Global mild atrophy without hydrocephalus.  Transverse ligament hypertrophy.  Minimal mucosal thickening inferior aspect right maxillary sinus.  IMPRESSION: Small acute infarcts superior right frontal lobe (anterior and posterior aspect).  Tiny acute infarct anterior limb right internal capsule superior aspect.  Tiny acute infarcts mid right cerebellum. Involvement of different vascular distributions raises possibility of embolic disease.  No intracranial hemorrhage.  Prominent small vessel disease type changes.  MRA HEAD  Findings: High-grade focal stenosis distal A1 segment left anterior cerebral artery.  Middle cerebral artery mild branch vessel irregularity bilaterally.  Abnormal appearance left vertebral artery which may be occluded with retrograde flow filling the distal aspect.  Ectatic right vertebral artery and basilar artery without high- grade stenosis.  PICAs not completely imaged on the present exam.  Posterior cerebral artery mild to moderate branch vessel irregularity.  No aneurysm or vascular malformation noted.  IMPRESSION: High-grade focal stenosis distal A1 segment left anterior cerebral artery.  Middle cerebral artery mild branch vessel irregularity bilaterally.  Abnormal appearance left vertebral artery which may be occluded with retrograde flow filling the distal aspect.  Posterior cerebral artery mild to moderate branch vessel irregularity.   Original Report Authenticated By: Fuller Canada, M.D.    Medications:  Scheduled:    . amLODipine  5 mg Oral Daily  . aspirin EC  162 mg Oral Daily  . atorvastatin  20 mg Oral q1800  . carvedilol  12.5 mg Oral BID WC  . cephALEXin  500 mg Oral Q12H  . clopidogrel  75 mg Oral Daily  . diltiazem  120 mg Oral Daily  . docusate sodium  100 mg Oral Daily  . enoxaparin  40 mg Subcutaneous Q24H  . folic acid  1 mg Oral Daily  . furosemide  40 mg Oral q morning - 10a  . isosorbide-hydrALAZINE  0.5 tablet Oral BID  . lisinopril  20 mg Oral Daily  . methotrexate  2.5 mg Oral 3 times weekly  . omega-3 acid ethyl esters  2 g Oral Daily  . pantoprazole  40 mg Oral Daily  . potassium chloride SA  20 mEq Oral Daily  . sodium chloride  500 mL Intravenous Once  . vitamin C  500 mg Oral Daily  . white petrolatum        Assessment/Plan:  Principal Problem:  *Stroke, acute, embolic Active Problems:  Acute left-sided weakness  Ankle cellulitis  Leukocytosis  Hypertension  Atrial fibrillation  Psoriasis  Basal cell carcinoma  Chronic diastolic HF (heart failure)  Anemia    Acute left-sided weakness secondary to acute stroke involving  left brain See MRI and MRA reports above. Suspect embolic stroke. Neurology input appreciated. Continue with ASA and Plavix per Neuro. Due to fall risk, coumadin is not desirable at this time. Appreciate Cardiology input as well.  Stroke work up is completed.  Right ankle pain/cellulitis  Presumed to be from cellulitis. X-ray is unremarkable for acute process. Uric acid level is 5.3. Try alternative pain medications. Continue Keflex  to complete a course. Pain character is suggestive of neuropathy and is present mostly at night. Will try low dose neurontin. If no improvement in 1-2 weeks she may require evaluation with MRI.  Leukocytosis  Likely due to cellulitis or stress demargination. Review of old labs reveal a chronically elevated WBC. Patient  should have this addressed by her PCP.  Anemia  Etiology unclear. Anemia panel reveals low feritin. Start oral iron.  Hypertension  Stable continue home medications.   History of A. Fib  Currently in sinus rhythm. Rate controlled.   Psoriasis  Management as per her dermatologist as outpatient.   Prophylaxis  Lovenox   CODE STATUS  DO NOT RESUSCITATE/DO NOT INTUBATE. Discussed with the patient at the time of admission.   Disposition: Patient will need SNF initially prior to returning to independent living. CSW notified. Possible discharge in AM.   LOS: 2 days   [facility] Pager [phone] 12/09/2011, 11:19 AM

## 2011-12-09 NOTE — Progress Notes (Signed)
OT Cancellation Note  Treatment cancelled today due to:  Pt currently on bedrest. OT evaluation held at this time. OT to reattempt later today with increased activity orders.   Sara [name] [place]   OTR/L Pager: [phone] Office: [phone] .

## 2011-12-09 NOTE — Evaluation (Signed)
Physical Therapy Evaluation Patient Details Name: Sara [name] MRN: [id number] DOB: 12-Aug-1919 Today's Date: 12/09/2011  Problem List:  Patient Active Problem List  Diagnoses  . Acute left-sided weakness  . Ankle cellulitis  . Leukocytosis  . Hypertension  . Atrial fibrillation  . Psoriasis  . Basal cell carcinoma  . Chronic diastolic HF (heart failure)  . Anemia  . Stroke, acute, embolic    Past Medical History:  Past Medical History  Diagnosis Date  . Hypertension   . Atrial fibrillation   . Psoriasis   . Congestive heart failure (CHF)   . Basal cell carcinoma   . History of CVA (cerebrovascular accident)   . Coronary artery disease   . Myocardial infarction   . CHF (congestive heart failure)   . Stroke   . Shortness of breath   . H/O hiatal hernia    Past Surgical History:  Past Surgical History  Procedure Date  . Tonsillectomy   . Dilation and curettage of uterus   . Eye surgery   . Cataracts     PT Assessment/Plan/Recommendation PT Assessment Clinical Impression Statement: Patient presented to hospital with left sided weakness and an NIHSS of 2. Radiology results show a questionable early acute right periopercular infarct and remote right thalamic and right caudate head infarct. She will benefit from PT in the acute care setting to increase her independence with functional mobiltiy to facilitate eventual return to independent living facility PT Recommendation/Assessment: Patient will need skilled PT in the acute care venue PT Problem List: Decreased strength;Decreased activity tolerance;Decreased balance;Decreased mobility PT Therapy Diagnosis : Difficulty walking;Abnormality of gait PT Plan PT Frequency: Min 4X/week PT Treatment/Interventions: DME instruction;Gait training;Functional mobility training;Therapeutic activities;Therapeutic exercise;Balance training;Patient/family education PT Recommendation Follow Up Recommendations: Skilled nursing facility  (at [facility]) Equipment Recommended: None recommended by PT PT Goals  Acute Rehab PT Goals PT Goal Formulation: With patient Time For Goal Achievement: 7 days Pt will go Supine/Side to Sit: with modified independence PT Goal: Supine/Side to Sit - Progress: Goal set today Pt will go Sit to Supine/Side: with modified independence PT Goal: Sit to Supine/Side - Progress: Goal set today Pt will go Sit to Stand: with modified independence;with upper extremity assist PT Goal: Sit to Stand - Progress: Goal set today Pt will go Stand to Sit: with modified independence;with upper extremity assist PT Goal: Stand to Sit - Progress: Goal set today Pt will Ambulate: >150 feet;with modified independence;with least restrictive assistive device PT Goal: Ambulate - Progress: Goal set today Pt will Perform Home Exercise Program: Independently PT Goal: Perform Home Exercise Program - Progress: Goal set today  PT Evaluation Precautions/Restrictions  Precautions Precautions: Fall Prior Functioning  Home Living Lives With: Alone Type of Home: Independent living facility Home Layout: One level Home Access: Level entry Bathroom Shower/Tub: Tub/shower unit Bathroom Toilet: Handicapped height Home Adaptive Equipment: Walker - rolling;Walker - four wheeled;Straight cane Prior Function Level of Independence: Independent with basic ADLs;Independent with transfers;Independent with gait Driving: Yes Comments: Uses cane for MD appointments, rollator to walk 1/4 mile to the dining area so she can sit and rest at intervals. No device in room unless at night intermitently uses walker to bathroom if feels weaker Cognition Cognition Arousal/Alertness: Awake/alert Overall Cognitive Status: Appears within functional limits for tasks assessed Orientation Level: Oriented X4 Sensation/Coordination Sensation Light Touch: Appears Intact Proprioception: Appears Intact Coordination Gross Motor Movements are Fluid  and Coordinated: Yes Fine Motor Movements are Fluid and Coordinated: Yes Extremity Assessment RLE Assessment  RLE Assessment: Within Functional Limits LLE Assessment LLE Assessment: Exceptions to WFL LLE AROM (degrees) Overall AROM Left Lower Extremity: Within functional limits for tasks assessed LLE Strength LLE Overall Strength: Deficits LLE Overall Strength Comments: 4/5 with gross test Mobility (including Balance) Bed Mobility Bed Mobility: Yes Supine to Sit: 5: Supervision Supine to Sit Details (indicate cue type and reason): Increased time to complete task. Sitting - Scoot to Edge of Bed: 5: Supervision Sitting - Scoot to [place] of Bed Details (indicate cue type and reason): with upper extemity use Sit to Supine: 5: Supervision Sit to Supine - Details (indicate cue type and reason): Able to reposition well to center of bed. Transfers Transfers: Yes Sit to Stand: 4: Min assist;With upper extremity assist;From bed Sit to Stand Details (indicate cue type and reason): Patient feels lower extremities are slipping and unsure that she is safe to stand - required cues to reassure and slight assistance for initiation only. Stand to Sit: 4: Min assist;With upper extremity assist;To bed Stand to Sit Details: Min-guard assistance Ambulation/Gait Ambulation/Gait: Yes Ambulation/Gait Assistance: 4: Min assist Ambulation/Gait Assistance Details (indicate cue type and reason): Min-guard assistance - improved posture, speed and left foot clearnace with increased distance. Ambulation Distance (Feet): 350 Feet Assistive device: Rolling walker Gait Pattern: Step-through pattern;Decreased stride length;Decreased hip/knee flexion - left;Decreased dorsiflexion - left;Trunk flexed  Balance Balance Assessed: Yes Static Sitting Balance Static Sitting - Balance Support: Feet supported;Bilateral upper extremity supported Static Sitting - Level of Assistance: 7: Independent   End of Session PT - End of  Session Equipment Utilized During Treatment: Gait belt Activity Tolerance: Patient tolerated treatment well Patient left: in bed;with call bell in reach;with bed alarm set Nurse Communication: Mobility status for ambulation General Behavior During Session: [facility] for tasks performed Cognition: [facility] for tasks performed  Edwyna Perfect, PT  Pager [phone]  12/09/2011, 11:17 AM

## 2011-12-09 NOTE — Progress Notes (Addendum)
Clinical Social Work Department CLINICAL SOCIAL WORK PLACEMENT NOTE 12/09/2011  Patient:  Sara [name], Sara [name]  Account Number:  [id number] Admit date:  12/07/2011  Clinical Social Worker:  Unk Lightning, LCSW  Date/time:  12/09/2011 12:15 PM  Clinical Social Work is seeking post-discharge placement for this patient at the following level of care:   SKILLED NURSING   (*CSW will update this form in Epic as items are completed)   12/09/2011  Patient/family provided with [facility] Department of Clinical Social Work's list of facilities offering this level of care within the geographic area requested by the patient (or if unable, by the patient's family).  12/09/2011  Patient/family informed of their freedom to choose among providers that offer the needed level of care, that participate in Medicare, Medicaid or managed care program needed by the patient, have an available bed and are willing to accept the patient.  12/09/2011  Patient/family informed of [facility]' ownership interest in [facility], as well as of the fact that they are under no obligation to receive care at this facility.  PASARR submitted to EDS on 12/09/2011 PASARR number received from EDS on 12/09/2011  FL2 transmitted to all facilities in geographic area requested by pt/family on  12/09/2011 FL2 transmitted to all facilities within larger geographic area on   Patient informed that his/her managed care company has contracts with or will negotiate with  certain facilities, including the following:   12/10/2011  Patient/family informed of bed offers received:  12/10/2011 Patient chooses bed at [facility] Physician recommends and patient chooses bed at    Patient to be transferred to  on  12/10/2011 Patient to be transferred to facility by Dtr  The following physician request were entered in Epic:   Additional Comments:

## 2011-12-09 NOTE — Progress Notes (Signed)
Stroke Team Progress Note  HISTORY Sara [name] is an 78 y.o. female who reports that last night she developed numbness on the left side of her face and in her left foot. The patient went to sleep and when she awakened this morning continued to have the complaints. On further questioning it also seems that she was having difficulty using her left foot and feels that her left arm is a little weak as well. Imaging negative for ankle fx. Not a candidate for tPA as she was outside the window. She has a history of atrial fibrillation. She was not on coumadin prior to admission due to fall risk.  SUBJECTIVE She has some new weakness in her hand yesterday afternoon, better today. From [facility] independent living - with PT agree to SNF for a few weeks prior to return to independent living.  OBJECTIVE Filed Vitals:   12/08/11 1813 12/08/11 2216 12/09/11 0200 12/09/11 0621  BP: 120/61 143/50 139/62 134/80  Pulse: 67 65 71 62  Temp: 97.3 F (36.3 C) 98.9 F (37.2 C) 98.6 F (37 C) 98.5 F (36.9 C)  TempSrc: Oral Oral Oral Oral  Resp: 18 18 18 18   Height:      Weight: 62.9 kg (138 lb 10.7 oz)     SpO2: 96% 97% 98% 96%   CBG (last 3) No results found for this basename: GLUCAP:3 in the last 72 hours Intake/Output from previous day: 03/27 0701 - 03/28 0700 In: 900 [P.O.:900] Out: -   IV Fluid Intake:     . sodium chloride     Medications    . amLODipine  5 mg Oral Daily  . aspirin EC  162 mg Oral Daily  . atorvastatin  20 mg Oral q1800  . carvedilol  12.5 mg Oral BID WC  . cephALEXin  500 mg Oral Q12H  . clopidogrel  75 mg Oral Daily  . diltiazem  120 mg Oral Daily  . docusate sodium  100 mg Oral Daily  . enoxaparin  40 mg Subcutaneous Q24H  . folic acid  1 mg Oral Daily  . furosemide  40 mg Oral q morning - 10a  . isosorbide-hydrALAZINE  0.5 tablet Oral BID  . lisinopril  20 mg Oral Daily  . methotrexate  2.5 mg Oral 3 times weekly  . omega-3 acid ethyl esters  2 g Oral Daily  .  pantoprazole  40 mg Oral Daily  . potassium chloride SA  20 mEq Oral Daily  . sodium chloride  500 mL Intravenous Once  . vitamin C  500 mg Oral Daily  . white petrolatum      PRN acetaminophen, oxyCODONE, senna-docusate, DISCONTD: acetaminophen  Diet:  General thin liquids Activity:  Up with assistance DVT Prophylaxis:  Lovenox 40 mg sq daily   Significant Diagnostic Studies: CBC    Component Value Date/Time   WBC 15.2* 12/09/2011 0612   RBC 3.63* 12/09/2011 0612   HGB 11.1* 12/09/2011 0612   HCT 33.9* 12/09/2011 0612   PLT 243 12/09/2011 0612   MCV 93.4 12/09/2011 0612   MCH 30.6 12/09/2011 0612   MCHC 32.7 12/09/2011 0612   RDW 14.7 12/09/2011 0612   LYMPHSABS 9.7* 12/07/2011 1012   MONOABS 0.8 12/07/2011 1012   EOSABS 0.3 12/07/2011 1012   BASOSABS 0.2* 12/07/2011 1012   CMP    Component Value Date/Time   NA 139 12/09/2011 0612   K 3.8 12/09/2011 0612   CL 106 12/09/2011 0612   CO2 25 12/09/2011  0612   GLUCOSE 87 12/09/2011 0612   BUN 16 12/09/2011 0612   CREATININE 0.74 12/09/2011 0612   CALCIUM 8.8 12/09/2011 0612   PROT 5.7* 12/08/2011 0605   ALBUMIN 3.1* 12/08/2011 0605   AST 23 12/08/2011 0605   ALT 20 12/08/2011 0605   ALKPHOS 46 12/08/2011 0605   BILITOT 0.3 12/08/2011 0605   GFRNONAA 73* 12/09/2011 0612   GFRAA 84* 12/09/2011 0612   COAGS Lab Results  Component Value Date   INR 1.18 12/27/2010   INR 0.97 12/27/2010   INR 1.03 11/06/2010   Lipid Panel    Component Value Date/Time   CHOL 144 12/08/2011 0605   TRIG 86 12/08/2011 0605   HDL 44 12/08/2011 0605   CHOLHDL 3.3 12/08/2011 0605   VLDL 17 12/08/2011 0605   LDLCALC 83 12/08/2011 0605   HgbA1C  Lab Results  Component Value Date   HGBA1C 6.1* 12/08/2011   Urine Drug Screen  No results found for this basename: labopia,  cocainscrnur,  labbenz,  amphetmu,  thcu,  labbarb    Alcohol Level No results found for this basename: eth    Dg Ankle 2 Views Right 12/07/2011   No acute osseous abnormality is seen.  Mild lateral  soft tissue swelling.    CT of the brain  12/07/2011  No intracranial hemorrhage.  Questionable early acute right periopercular infarct.  Remote right thalamic and right caudate head infarct.   MRI of the brain  12/08/2011   Small acute infarcts superior right frontal lobe (anterior and posterior aspect).  Tiny acute infarct anterior limb right internal capsule superior aspect.  Tiny acute infarcts mid right cerebellum. Involvement of different vascular distributions raises possibility of embolic disease.  No intracranial hemorrhage.  Prominent small vessel disease type changes.   MRA of the brain  12/08/2011  High-grade focal stenosis distal A1 segment left anterior cerebral artery.  Middle cerebral artery mild branch vessel irregularity bilaterally.  Abnormal appearance left vertebral artery which may be occluded with retrograde flow filling the distal aspect.  Posterior cerebral artery mild to moderate branch vessel irregularity.  .  2D Echocardiogram  EF 55-60% with no source of embolus.   Carotid Doppler   11-01-11 no evidence of significant ICA stenosis on the right and 50-69% stenosis on the left. Left vertebral artery appeared occluded.  CXR  12/07/2011   1.  Cardiac enlargement. 2.  No heart failure.   EKG  normal sinus rhythm.   Physical Exam   Frail elderly Caucasian lady currently not in distress. Afebrile. Head is not dramatic. Hearing is mildly decreased bilaterally. Neck is supple with soft carotid bruit. Cardiac exam no murmur or gallop. Lungs clear to auscultation. Distal pulses well felt. Right ankle is swollen and tender to touch. Neurological exam Awake alert oriented x 3 normal speech and language. Mild left lower face asymmetry. Tongue midline. No drift. Mild diminished fine finger movements on left. Orbits right over left upper extremity. Mild left grip weak.. Normal sensation . Normal coordination.   ASSESSMENT Sara [name] is a 78 y.o. female with small acute infarcts  superior right frontal lobe (anterior and posterior aspect), tiny acute infarct anterior limb right internal capsule superior aspect and tiny acute infarcts mid right cerebellum,  secondary to atrial fibrillation. On aspirin 81 mg orally every day and clopidogrel 75 mg orally every day prior admission. Now on aspirin 162 mg orally every day and clopidogrel 75 mg orally every day for secondary stroke prevention.   -  atrial fibrillation, not a coumadin candidate secondary to fall risk. -hypertension -basal cell carcinoma  Hospital day # 2  TREATMENT/PLAN - we recommend aspirin 81 mg orally every day and clopidogrel 75 mg orally every day for secondary stroke prevention. No benefit of increasing ASA  -agree with SNF at [facility] prior to return to independent living Stroke Service will sign off. Follow up with Dr. Pearlean Brownie in 2 months.  Joaquin Music, ANP-BC, GNP-BC [facility] Pager: [phone] 12/09/2011 8:50 AM  Dr. Delia Heady, Stroke Center Medical Director, has personally reviewed chart, pertinent data, examined the patient and developed the plan of care.

## 2011-12-09 NOTE — Telephone Encounter (Signed)
On New Outpatient Referral workup, it was noted that patient was currently INP for possible CVA workup. Called and spoke to [facility] with referring MD, Dr Merri Brunette, they were unaware of this.  We will continue to monitor for patient discharge and establish patient at that time once current medical condition has stabilized.

## 2011-12-10 ENCOUNTER — Other Ambulatory Visit: Payer: Self-pay

## 2011-12-10 MED ORDER — GABAPENTIN 100 MG PO CAPS: 100.0000 mg | ORAL_CAPSULE | Freq: Every day | ORAL | Status: DC

## 2011-12-10 MED ORDER — POLYSACCHARIDE IRON COMPLEX 150 MG PO CAPS: 150.0000 mg | ORAL_CAPSULE | Freq: Every day | ORAL | Status: DC

## 2011-12-10 MED ORDER — ACETAMINOPHEN 500 MG PO TABS: 500.0000 mg | ORAL_TABLET | ORAL | Status: AC | PRN

## 2011-12-10 NOTE — Progress Notes (Signed)
Physical Therapy Treatment Patient Details Name: Sara [name] MRN: [id number] DOB: [birth year] Today's Date: 12/10/2011  PT Assessment/Plan  PT - Assessment/Plan Comments on Treatment Session: Patient with increased left hand weakness today - MD aware. Patient able to oppose digits yesterady and unable today. No change in left lower extemity weakness noted, but increased fatigue. Patient will definitely require SNF at discharge PT Plan: Discharge plan remains appropriate;Frequency remains appropriate Equipment Recommended: None recommended by PT PT Goals  Acute Rehab PT Goals PT Goal: Supine/Side to Sit - Progress: Met PT Goal: Sit to Stand - Progress: Progressing toward goal PT Goal: Stand to Sit - Progress: Progressing toward goal PT Goal: Ambulate - Progress: Progressing toward goal  PT Treatment Precautions/Restrictions  Precautions Precautions: Fall Mobility (including Balance) Bed Mobility Supine to Sit: 6: Modified independent (Device/Increase time) Supine to Sit Details (indicate cue type and reason): Patient up to edge of bed when PT left room to get new gown Sitting - Scoot to Edge of Bed: 6: Modified independent (Device/Increase time) Transfers Sit to Stand: 4: Min assist;With upper extremity assist;From bed. Practiced for correct technique and improved sequencing of hip and knee extension Sit to Stand Details (indicate cue type and reason): Patient with difficulty initiating and stabilizing at end range. Questioning cues for correct hand placement and then patient able to recall. Stand to Sit: 4: Min assist;With upper extremity assist;With armrests Stand to Sit Details: Min- guard assistance - safe control of descent Ambulation/Gait Ambulation/Gait Assistance Details (indicate cue type and reason): Patient states feeling weaker today - particularly of left hand. MD in room during session and so aware. No change in lower extremity strength, but overall more fatigued than  previous day. Ambulation Distance (Feet): 200 Feet Assistive device: Rolling walker Gait Pattern: Decreased stride length;Step-through pattern;Decreased hip/knee flexion - left;Decreased dorsiflexion - left  Static Sitting Balance Static Sitting - Balance Support: Feet unsupported Static Sitting - Level of Assistance: 7: Independent Static Standing Balance Static Standing - Balance Support: Bilateral upper extremity supported Static Standing - Level of Assistance: 5: Stand by assistance End of Session PT - End of Session Equipment Utilized During Treatment: Gait belt Activity Tolerance: Patient limited by fatigue Patient left: in chair;with call bell in reach Nurse Communication: Mobility status for transfers;Mobility status for ambulation (Generalized inflammation of skin) General Behavior During Session: [facility] for tasks performed Cognition: [facility] for tasks performed  Edwyna Perfect, PT  Pager [phone]  12/10/2011, 10:18 AM

## 2011-12-10 NOTE — Discharge Summary (Signed)
Physician Discharge Summary  Patient ID: Sara [name] MRN: [id number] DOB/AGE: [birth year] 78 y.o.  Admit date: 12/07/2011 Discharge date: 12/10/2011  PCP: Londell Moh, MD, MD  Discharge Diagnoses:  Principal Problem:  *Stroke, acute, embolic Active Problems:  Acute left-sided weakness  Ankle cellulitis  Leukocytosis  Hypertension  Atrial fibrillation  Psoriasis  Basal cell carcinoma  Chronic diastolic HF (heart failure)  Anemia   Discharged Condition: fair  Initial History: Sara [name] is a 78 y.o. pleasant caucasian female history of CVA, A. fib currently in sinus rhythm, chronic diastolic congestive heart failure compensated, hypertension, psoriasis, hyperlipidemia, and recent right ankle cellulitis on day 8 of 10 of Keflex who presented with the above complaints. Patient reported that she has had 2 strokes in the past. Around 1130 p.m of the night before admission she noted that she had left-sided weakness with increasing numbness over her left lip. She also noted that she was dragging her left foot but thought that may have been due to wet floor. She woke up the morning of admission around 730 a.m. and noted left-sided weakness with persistent increased numbness of the left lip. She presented to the emergency department for further evaluation. She denied any recent fevers, chills, nausea, vomiting, chest pain, shortness of breath, abdominal pain, headaches or vision changes. Patient reported that she has been having right ankle pain for at least 3 weeks now and has not improved. She has been to a dermatologist recently for evaluation of lesion on her left shin and also for her cellulitis of right ankle for which she is taking Keflex.   Hospital Course:   Acute left-sided weakness secondary to acute stroke involving left brain  See MRI and MRA reports below. Suspect embolic stroke. Neurology saw the patient. Continue with ASA and Plavix per Neuro. Due to fall risk,  coumadin is not desirable at this time. Patient was seen by her cardiologist as well, Dr. Allyson Sabal. Patient continues to have weakness in left arm which appears to have gotten worse. She will need SNF for rehab. Hopefully she will be able to return to independent living. But her prognosis remains guarded.   Right ankle pain/cellulitis  Presumed to be from cellulitis. X-ray is unremarkable for acute process. Uric acid level is 5.3. Try alternative pain medications. Continue Keflex to complete a course. Pain character is suggestive of neuropathy and is present mostly at night. Low dose neurontin seems to have helped some. If no improvement in 1-2 weeks she may require evaluation with MRI. Will defer further work up to her PCP.  Leukocytosis  Likely due to cellulitis or stress demargination. Review of old labs reveal a chronically elevated WBC. Patient can have this addressed by her PCP.   Iron deficiency Anemia  Etiology unclear. Anemia panel reveals low feritin. Start oral iron.   Hypertension  Stable continue home medications.   History of A. Fib  Currently in sinus rhythm. Rate controlled.   Psoriasis  Management as per her dermatologist as outpatient.   CODE STATUS  DO NOT RESUSCITATE/DO NOT INTUBATE. Discussed with the patient at the time of admission.   Patient has abed available at [facility] and she is stable for discharge.  PERTINENT LABS  WBC of 16.7. Hgb 11.7. LDL 83, HDL 44. Ferritin 18, TIBC 306.  IMAGING STUDIES Dg Chest 2 View  12/07/2011  *RADIOLOGY REPORT*  Clinical Data: Status post stroke  CHEST - 2 VIEW  Comparison: 03/01/2011  Findings:  Calcified atherosclerotic disease affects the  thoracic aorta.  There is mild cardiac enlargement.  There is no pleural effusion or edema.  Lungs are hyperinflated and there are coarsened interstitial markings identified bilaterally.  No airspace consolidation.  Compression deformity is noted within the lower thoracic spine. This has been  treated with bone cement.  IMPRESSION:  1.  Cardiac enlargement. 2.  No heart failure.  Original Report Authenticated By: Rosealee Albee, M.D.   Dg Ankle 2 Views Right  12/07/2011  *RADIOLOGY REPORT*  Clinical Data: Right ankle pain, cellulitis  RIGHT ANKLE - 2 VIEW  Comparison: None.  Findings: No fracture or dislocation is seen.  The ankle mortise is intact.  The base of the fifth metatarsal is unremarkable.  Plantar and posterior calcaneal enthesopathy.  Very mild lateral soft tissue swelling.  No underlying osseous abnormality.  IMPRESSION: No acute osseous abnormality is seen.  Mild lateral soft tissue swelling.  Original Report Authenticated By: Charline Bills, M.D.   Ct Head Wo Contrast  12/07/2011  *RADIOLOGY REPORT*  Clinical Data: New onset of left lower extremity weakness and left- sided facial numbness.  CT HEAD WITHOUT CONTRAST  Technique:  Contiguous axial images were obtained from the base of the skull through the vertex without contrast.  Comparison: 11/27/2009 CT.  Findings: No intracranial hemorrhage.  Prominent small vessel disease type changes.  Remote small infarcts right caudate head and right thalamus.  Questionable early acute right periopercular infarct.  No intracranial mass lesion detected on this unenhanced exam.  Global atrophy without hydrocephalus.  Vascular calcifications.  IMPRESSION: No intracranial hemorrhage.  Questionable early acute right periopercular infarct.  Remote right thalamic and right caudate head infarct.  Results called to Dr. Weldon Inches.  Original Report Authenticated By: Fuller Canada, M.D.   Mr Brain Wo Contrast  12/08/2011  *RADIOLOGY REPORT*  Clinical Data:  New onset of left lower extremity weakness and left- sided facial numbness.  MRI HEAD WITHOUT CONTRAST MRA HEAD WITHOUT CONTRAST  Technique: Multiplanar, multiecho pulse sequences of the brain and surrounding structures were obtained according to standard protocol without intravenous contrast.   Angiographic images of the head were obtained using MRA technique without contrast.  Comparison: 12/07/2011 CT.  12/20/2007 MR.  MRI HEAD  Findings:  Small acute infarcts superior right frontal lobe (anterior and posterior aspect).  Tiny acute infarct anterior limb right internal capsule superior aspect.  Tiny acute infarcts mid right cerebellum.  Involvement of different vascular distributions raises possibility of embolic disease.  No intracranial hemorrhage.  Prominent small vessel disease type changes.  Remote infarct at the junction of the right thalamus and posterior limb of the right internal capsule.  Remote tiny cerebellar infarcts.  No intracranial mass lesion noted on this unenhanced exam.  Global mild atrophy without hydrocephalus.  Transverse ligament hypertrophy.  Minimal mucosal thickening inferior aspect right maxillary sinus.  IMPRESSION: Small acute infarcts superior right frontal lobe (anterior and posterior aspect).  Tiny acute infarct anterior limb right internal capsule superior aspect.  Tiny acute infarcts mid right cerebellum. Involvement of different vascular distributions raises possibility of embolic disease.  No intracranial hemorrhage.  Prominent small vessel disease type changes.  MRA HEAD  Findings: High-grade focal stenosis distal A1 segment left anterior cerebral artery.  Middle cerebral artery mild branch vessel irregularity bilaterally.  Abnormal appearance left vertebral artery which may be occluded with retrograde flow filling the distal aspect.  Ectatic right vertebral artery and basilar artery without high- grade stenosis.  PICAs not completely imaged on the present exam.  Posterior cerebral artery mild to moderate branch vessel irregularity.  No aneurysm or vascular malformation noted.  IMPRESSION: High-grade focal stenosis distal A1 segment left anterior cerebral artery.  Middle cerebral artery mild branch vessel irregularity bilaterally.  Abnormal appearance left vertebral  artery which may be occluded with retrograde flow filling the distal aspect.  Posterior cerebral artery mild to moderate branch vessel irregularity.  Original Report Authenticated By: Fuller Canada, M.D.   Mr Mra Head/brain Wo Cm  12/08/2011  *RADIOLOGY REPORT*  Clinical Data:  New onset of left lower extremity weakness and left- sided facial numbness.  MRI HEAD WITHOUT CONTRAST MRA HEAD WITHOUT CONTRAST  Technique: Multiplanar, multiecho pulse sequences of the brain and surrounding structures were obtained according to standard protocol without intravenous contrast.  Angiographic images of the head were obtained using MRA technique without contrast.  Comparison: 12/07/2011 CT.  12/20/2007 MR.  MRI HEAD  Findings:  Small acute infarcts superior right frontal lobe (anterior and posterior aspect).  Tiny acute infarct anterior limb right internal capsule superior aspect.  Tiny acute infarcts mid right cerebellum.  Involvement of different vascular distributions raises possibility of embolic disease.  No intracranial hemorrhage.  Prominent small vessel disease type changes.  Remote infarct at the junction of the right thalamus and posterior limb of the right internal capsule.  Remote tiny cerebellar infarcts.  No intracranial mass lesion noted on this unenhanced exam.  Global mild atrophy without hydrocephalus.  Transverse ligament hypertrophy.  Minimal mucosal thickening inferior aspect right maxillary sinus.  IMPRESSION: Small acute infarcts superior right frontal lobe (anterior and posterior aspect).  Tiny acute infarct anterior limb right internal capsule superior aspect.  Tiny acute infarcts mid right cerebellum. Involvement of different vascular distributions raises possibility of embolic disease.  No intracranial hemorrhage.  Prominent small vessel disease type changes.  MRA HEAD  Findings: High-grade focal stenosis distal A1 segment left anterior cerebral artery.  Middle cerebral artery mild branch vessel  irregularity bilaterally.  Abnormal appearance left vertebral artery which may be occluded with retrograde flow filling the distal aspect.  Ectatic right vertebral artery and basilar artery without high- grade stenosis.  PICAs not completely imaged on the present exam.  Posterior cerebral artery mild to moderate branch vessel irregularity.  No aneurysm or vascular malformation noted.  IMPRESSION: High-grade focal stenosis distal A1 segment left anterior cerebral artery.  Middle cerebral artery mild branch vessel irregularity bilaterally.  Abnormal appearance left vertebral artery which may be occluded with retrograde flow filling the distal aspect.  Posterior cerebral artery mild to moderate branch vessel irregularity.  Original Report Authenticated By: Fuller Canada, M.D.    Discharge Exam: Blood pressure 155/49, pulse 62, temperature 98.3 F (36.8 C), temperature source Oral, resp. rate 18, height 5' 4.5" (1.638 m), weight 62.9 kg (138 lb 10.7 oz), SpO2 96.00%. General appearance: alert, cooperative, appears stated age and no distress Head: Normocephalic, without obvious abnormality, atraumatic Resp: clear to auscultation bilaterally Cardio: regular rate and rhythm, S1, S2 normal, no murmur, click, rub or gallop GI: soft, non-tender; bowel sounds normal; no masses,  no organomegaly Extremities: bruising noted in left arm/hand Neurologic: Left arm weakness more than left leg weakness  Disposition: SNF for rehab  Discharge Orders    Future Orders Please Complete By Expires   Diet - low sodium heart healthy      Increase activity slowly        Current Discharge Medication List    START taking these medications   Details  acetaminophen (TYLENOL) 500 MG tablet Take 1 tablet (500 mg total) by mouth every 4 (four) hours as needed. Qty: 30 tablet    gabapentin (NEURONTIN) 100 MG capsule Take 1 capsule (100 mg total) by mouth at bedtime. Qty: 30 capsule, Refills: 0    iron polysaccharides  (NIFEREX) 150 MG capsule Take 1 capsule (150 mg total) by mouth daily with breakfast.      CONTINUE these medications which have NOT CHANGED   Details  amLODipine (NORVASC) 5 MG tablet Take 5 mg by mouth daily.    aspirin EC 81 MG tablet Take 162 mg by mouth daily.    carvedilol (COREG) 12.5 MG tablet Take 12.5 mg by mouth 2 (two) times daily with a meal.    clopidogrel (PLAVIX) 75 MG tablet Take 75 mg by mouth daily.    Coenzyme Q10 (CO Q 10) 100 MG CAPS Take 1 capsule by mouth daily.    diltiazem (CARDIZEM) 120 MG tablet Take 120 mg by mouth daily.    Docusate Calcium (STOOL SOFTENER PO) Take 1 capsule by mouth daily.    folic acid (FOLVITE) 1 MG tablet Take 1 mg by mouth daily.    furosemide (LASIX) 40 MG tablet Take 40 mg by mouth every morning.    isosorbide-hydrALAZINE (BIDIL) 20-37.5 MG per tablet Take 0.5 tablets by mouth 2 (two) times daily.    lisinopril (PRINIVIL,ZESTRIL) 40 MG tablet Take 20 mg by mouth daily.    MAGNESIUM PO Take 1 tablet by mouth daily. 500mg     methotrexate (RHEUMATREX) 2.5 MG tablet Take 2.5 mg by mouth 3 (three) times a week. Caution:Chemotherapy. Protect from light.    Omega-3 Fatty Acids (FISH OIL) 1000 MG CAPS Take 2,000 mg by mouth daily.    pantoprazole (PROTONIX) 40 MG tablet Take 40 mg by mouth daily.    potassium chloride SA (K-DUR,KLOR-CON) 20 MEQ tablet Take 20 mEq by mouth daily.    rosuvastatin (CRESTOR) 20 MG tablet Take 10 mg by mouth every other day.    vitamin C (ASCORBIC ACID) 500 MG tablet Take 500 mg by mouth daily.       Follow-up Information    Follow up with Runell Gess, MD. (Our office will call with appt. date and time.)    Contact information:   [address] [place] [address] [phone]       Follow up with Londell Moh, MD. Schedule an appointment as soon as possible for a visit in 1 week. (post hospitalization follow up.)    Contact information:   [id number], [address] [facility] [place] [address] [phone]          Total Discharge Time: 35 mins  [facility]  [facility] Pager [phone]  12/10/2011, 12:48 PM

## 2011-12-10 NOTE — Progress Notes (Signed)
Subjective:  No CP/SOB. She does c/o left UE and LE weakness  Objective:  Temp:  [97.3 F (36.3 C)-98.5 F (36.9 C)] 98.5 F (36.9 C) (03/28 2021) Pulse Rate:  [61-65] 63  (03/28 2021) Resp:  [18] 18  (03/28 2021) BP: (120-143)/(53-61) 134/61 mmHg (03/29 0319) SpO2:  [95 %-97 %] 97 % (03/28 2021) Weight change:   Intake/Output from previous day: 03/28 0701 - 03/29 0700 In: 824 [P.O.:824] Out: -   Intake/Output from this shift:    Physical Exam: General appearance: alert and cooperative Neck: no adenopathy, no JVD, supple, symmetrical, trachea midline, thyroid not enlarged, symmetric, no tenderness/mass/nodules and Bilateral carotid bruits Lungs: clear to auscultation bilaterally Heart: regular rate and rhythm, S1, S2 normal, no murmur, click, rub or gallop Extremities: extremities normal, atraumatic, no cyanosis or edema  Lab Results: Results for orders placed during the hospital encounter of 12/07/11 (from the past 48 hour(s))  CBC     Status: Abnormal   Collection Time   12/09/11  6:12 AM      Component Value Range Comment   WBC 15.2 (*) 4.0 - 10.5 (K/uL)    RBC 3.63 (*) 3.87 - 5.11 (MIL/uL)    Hemoglobin 11.1 (*) 12.0 - 15.0 (g/dL)    HCT 56.2 (*) 13.0 - 46.0 (%)    MCV 93.4  78.0 - 100.0 (fL)    MCH 30.6  26.0 - 34.0 (pg)    MCHC 32.7  30.0 - 36.0 (g/dL)    RDW 86.5  78.4 - 69.6 (%)    Platelets 243  150 - 400 (K/uL)   BASIC METABOLIC PANEL     Status: Abnormal   Collection Time   12/09/11  6:12 AM      Component Value Range Comment   Sodium 139  135 - 145 (mEq/L)    Potassium 3.8  3.5 - 5.1 (mEq/L)    Chloride 106  96 - 112 (mEq/L)    CO2 25  19 - 32 (mEq/L)    Glucose, Bld 87  70 - 99 (mg/dL)    BUN 16  6 - 23 (mg/dL)    Creatinine, Ser 2.95  0.50 - 1.10 (mg/dL)    Calcium 8.8  8.4 - 10.5 (mg/dL)    GFR calc non Af Amer 73 (*) >90 (mL/min)    GFR calc Af Amer 84 (*) >90 (mL/min)     Imaging: Imaging results have been reviewed  Assessment/Plan:    1. Principal Problem: 2.  *Stroke, acute, embolic 3. Active Problems: 4.  Acute left-sided weakness 5.  Ankle cellulitis 6.  Leukocytosis 7.  Hypertension 8.  Atrial fibrillation 9.  Psoriasis 10.  Basal cell carcinoma 11.  Chronic diastolic HF (heart failure) 12.  Anemia 13.   Time Spent Directly with Patient:  20 minutes  Length of Stay:  LOS: 3 days   Pt well known to me s/p Right carotid stent which I performed last year. She has PAF on ASA and plavix (not a coumadin candidate secondary to fall risk). Distribution of small acute strokes on right suggest cardio embolic. Ambulating with assistance. Cardiac stable. NSR on tele. Recent carotid dopplers in our office show a widely patent stent. Nothing further to add. Will s/o. Pt will need to see me back in the office in 2-3 weeks after D/C. Agree with SNF for rehab.   Runell Gess 12/10/2011, 10:12 AM

## 2011-12-10 NOTE — Evaluation (Signed)
Occupational Therapy Evaluation Patient Details Name: Sara [name] MRN: [id number] DOB: [birth year] Today's Date: 12/10/2011  Problem List:  Patient Active Problem List  Diagnoses  . Acute left-sided weakness  . Ankle cellulitis  . Leukocytosis  . Hypertension  . Atrial fibrillation  . Psoriasis  . Basal cell carcinoma  . Chronic diastolic HF (heart failure)  . Anemia  . Stroke, acute, embolic    Past Medical History:  Past Medical History  Diagnosis Date  . Hypertension   . Atrial fibrillation   . Psoriasis   . Congestive heart failure (CHF)   . Basal cell carcinoma   . History of CVA (cerebrovascular accident)   . Coronary artery disease   . Myocardial infarction   . CHF (congestive heart failure)   . Stroke   . Shortness of breath   . H/O hiatal hernia    Past Surgical History:  Past Surgical History  Procedure Date  . Tonsillectomy   . Dilation and curettage of uterus   . Eye surgery   . Cataracts     OT Assessment/Plan/Recommendation OT Assessment Clinical Impression Statement: 78 yo female s/p CVA with Lt UE deficits and balance deficits. Ot to follow acutely. OT Recommendation/Assessment: Patient will need skilled OT in the acute care venue OT Problem List: Decreased strength;Decreased activity tolerance;Impaired balance (sitting and/or standing);Decreased knowledge of use of DME or AE;Impaired sensation;Impaired UE functional use OT Therapy Diagnosis : Generalized weakness;Hemiplegia non-dominant side OT Plan OT Frequency: Min 2X/week OT Treatment/Interventions: Self-care/ADL training;Therapeutic exercise;Balance training;Patient/family education OT Recommendation Follow Up Recommendations: Skilled nursing facility Equipment Recommended: Defer to next venue Individuals Consulted Consulted and Agree with Results and Recommendations: Patient OT Goals Acute Rehab OT Goals OT Goal Formulation: With patient Time For Goal Achievement: 2 weeks ADL  Goals Pt Will Transfer to Toilet: with min assist;3-in-1 ADL Goal: Toilet Transfer - Progress: Goal set today Pt Will Perform Toileting - Clothing Manipulation: with min assist;Sitting on 3-in-1 or toilet ADL Goal: Toileting - Clothing Manipulation - Progress: Goal set today Pt Will Perform Toileting - Hygiene: with min assist;Sit to stand from 3-in-1/toilet ADL Goal: Toileting - Hygiene - Progress: Goal set today Miscellaneous OT Goals Miscellaneous OT Goal #1: Pt will complete 3 fine motor exercises Mod I to demonstrate home exercise program OT Goal: Miscellaneous Goal #1 - Progress: Goal set today  OT Evaluation Precautions/Restrictions  Precautions Precautions: Fall Prior Functioning Home Living Lives With: Alone Type of Home: Independent living facility Home Layout: One level Home Access: Level entry Bathroom Shower/Tub: Tub/shower unit Bathroom Toilet: Handicapped height Home Adaptive Equipment: Walker - rolling;Walker - four wheeled;Straight cane Prior Function Level of Independence: Independent with basic ADLs;Independent with transfers;Independent with gait Driving: Yes Vocation: Retired ADL ADL Eating/Feeding: Simulated;Set up Where Assessed - Eating/Feeding: Chair Grooming: Performed;Minimal assistance Where Assessed - Grooming: Sitting, chair;Supported Lower Body Dressing: Performed;Moderate assistance Where Assessed - Lower Body Dressing: Sitting, chair;Unsupported Toilet Transfer: Performed;Minimal assistance Toilet Transfer Details (indicate cue type and reason): posterior lean Toilet Transfer Method: Stand pivot Toilet Transfer Equipment: Raised toilet seat with arms (or 3-in-1 over toilet) Toileting - Clothing Manipulation: Simulated;Maximal assistance Where Assessed - Toileting Clothing Manipulation: Sit to stand from 3-in-1 or toilet Equipment Used: Rolling walker ADL Comments: Pt provided retrograde massage to Lt hand and (A)ing pt total (A) to doff  wedding bands. Bands provided to daughter present in room. Pt and daughter educated on retrograde massage and elevating Lt UE. Pt provided super soft tan theraputty. Pt 's daughter requesting  more putty and asking what kind of cream is applied to hip patients buttocks so she could apply it to patients feet. Pts daughter educated that only one container of putty is needed at this time and that personal lotion that pt is applying to sole of feet is okay at this time.  Vision/Perception  Vision - History Baseline Vision: No visual deficits Cognition Cognition Arousal/Alertness: Awake/alert Overall Cognitive Status: Appears within functional limits for tasks assessed Orientation Level: Oriented X4 Sensation/Coordination Coordination Gross Motor Movements are Fluid and Coordinated: Yes Fine Motor Movements are Fluid and Coordinated: No Extremity Assessment RUE Assessment RUE Assessment: Within Functional Limits LUE Assessment LUE Assessment: Exceptions to [facility] Mobility  Transfers Transfers: Yes Sit to Stand: 3: Mod assist;With upper extremity assist;With armrests;From chair/3-in-1 Sit to Stand Details (indicate cue type and reason): min v/c hand placement  Stand to Sit: 4: Min assist;With upper extremity assist;With armrests Exercises   End of Session OT - End of Session Equipment Utilized During Treatment: Gait belt Activity Tolerance: Patient tolerated treatment well Patient left: in chair;with call bell in reach;with family/visitor present Nurse Communication: Mobility status for transfers General Behavior During Session: [facility] for tasks performed Cognition: [facility] for tasks performed   Lucile Shutters 12/10/2011, 3:47 PM  Pager: [phone]

## 2011-12-10 NOTE — Progress Notes (Signed)
[facility] notified  Monitor tech alerted to change to Junctional bp WNL in epic  No chest pain or SOB pt was asleep EKG done as ordered. Will continue to monitor

## 2011-12-10 NOTE — Progress Notes (Signed)
CSW met with family and patient who decided to go to [facility] for SNF. CSW faxed dc summary and medications to SNF and prepared dc packet. SNF agreeable to admission. CSW informed patient, family and RN of dc. Family will transport patient to SNF. CSW provided family with chart copy to take to SNF. CSW is signing off but available if needed. [place], [place] [phone]

## 2011-12-10 NOTE — Progress Notes (Signed)
Pt. D/c instructions to family. Pt. And family verbalized understanding. Pt I/V d/c intact. Pt under no s/s distress.

## 2011-12-14 DIAGNOSIS — M25579 Pain in unspecified ankle and joints of unspecified foot: Secondary | ICD-10-CM

## 2011-12-14 DIAGNOSIS — G609 Hereditary and idiopathic neuropathy, unspecified: Secondary | ICD-10-CM

## 2011-12-14 HISTORY — DX: Pain in unspecified ankle and joints of unspecified foot: M25.579

## 2011-12-14 HISTORY — DX: Hereditary and idiopathic neuropathy, unspecified: G60.9

## 2011-12-21 DIAGNOSIS — K2100 Gastro-esophageal reflux disease with esophagitis, without bleeding: Secondary | ICD-10-CM

## 2011-12-21 DIAGNOSIS — D7289 Other specified disorders of white blood cells: Secondary | ICD-10-CM

## 2011-12-21 DIAGNOSIS — D649 Anemia, unspecified: Secondary | ICD-10-CM

## 2011-12-21 DIAGNOSIS — I739 Peripheral vascular disease, unspecified: Secondary | ICD-10-CM

## 2011-12-21 HISTORY — DX: Anemia, unspecified: D64.9

## 2011-12-21 HISTORY — DX: Other specified disorders of white blood cells: D72.89

## 2011-12-21 HISTORY — DX: Peripheral vascular disease, unspecified: I73.9

## 2011-12-21 HISTORY — DX: Gastro-esophageal reflux disease with esophagitis, without bleeding: K21.00

## 2011-12-24 ENCOUNTER — Telehealth: Payer: Self-pay | Admitting: *Deleted

## 2011-12-24 NOTE — Telephone Encounter (Signed)
Called and left message for Sara [name] to get an update on patient since hospital discharge. While working up original referral from 12/01/11, it was noted patient had been admitted to hospital for stroke. Patient was to be seeing Dr Renne Crigler 1 week post discharge and decision to proceed or not with referral were going to be decided at that time.  Will wait to hear from Dr Renne Crigler for outpatient consult for leukocytosis vs. possible CLL.

## 2011-12-27 ENCOUNTER — Telehealth: Payer: Self-pay | Admitting: *Deleted

## 2011-12-30 ENCOUNTER — Telehealth: Payer: Self-pay | Admitting: Oncology

## 2011-12-30 NOTE — Telephone Encounter (Signed)
lmonvm adviisng the pt of her new pt appt with dr Cyndie Chime in may. Asked the pt to call me back to confirm the appt

## 2011-12-31 DIAGNOSIS — F411 Generalized anxiety disorder: Secondary | ICD-10-CM

## 2011-12-31 HISTORY — DX: Generalized anxiety disorder: F41.1

## 2012-01-04 ENCOUNTER — Telehealth: Payer: Self-pay | Admitting: Oncology

## 2012-01-04 NOTE — Telephone Encounter (Signed)
Second attempt to reach the pt for an appt. Pt may still be in the hospital will wait for a return call.

## 2012-01-08 ENCOUNTER — Inpatient Hospital Stay (HOSPITAL_COMMUNITY)
Admission: EM | Admit: 2012-01-08 | Discharge: 2012-01-14 | DRG: 193 | Disposition: A | Discharge status: Skilled Nursing Facility | Payer: Medicare Other | Attending: Internal Medicine | Admitting: Internal Medicine

## 2012-01-08 ENCOUNTER — Encounter (HOSPITAL_COMMUNITY): Payer: Self-pay | Admitting: *Deleted

## 2012-01-08 ENCOUNTER — Emergency Department (HOSPITAL_COMMUNITY): Payer: Medicare Other

## 2012-01-08 DIAGNOSIS — L408 Other psoriasis: Secondary | ICD-10-CM | POA: Diagnosis present

## 2012-01-08 DIAGNOSIS — I10 Essential (primary) hypertension: Secondary | ICD-10-CM

## 2012-01-08 DIAGNOSIS — I480 Paroxysmal atrial fibrillation: Secondary | ICD-10-CM | POA: Diagnosis present

## 2012-01-08 DIAGNOSIS — I252 Old myocardial infarction: Secondary | ICD-10-CM

## 2012-01-08 DIAGNOSIS — L409 Psoriasis, unspecified: Secondary | ICD-10-CM

## 2012-01-08 DIAGNOSIS — D649 Anemia, unspecified: Secondary | ICD-10-CM

## 2012-01-08 DIAGNOSIS — I5033 Acute on chronic diastolic (congestive) heart failure: Secondary | ICD-10-CM | POA: Diagnosis present

## 2012-01-08 DIAGNOSIS — L0390 Cellulitis, unspecified: Secondary | ICD-10-CM

## 2012-01-08 DIAGNOSIS — C4491 Basal cell carcinoma of skin, unspecified: Secondary | ICD-10-CM

## 2012-01-08 DIAGNOSIS — J189 Pneumonia, unspecified organism: Principal | ICD-10-CM

## 2012-01-08 DIAGNOSIS — E46 Unspecified protein-calorie malnutrition: Secondary | ICD-10-CM | POA: Diagnosis present

## 2012-01-08 DIAGNOSIS — I639 Cerebral infarction, unspecified: Secondary | ICD-10-CM

## 2012-01-08 DIAGNOSIS — L03119 Cellulitis of unspecified part of limb: Secondary | ICD-10-CM

## 2012-01-08 DIAGNOSIS — R531 Weakness: Secondary | ICD-10-CM

## 2012-01-08 DIAGNOSIS — I4891 Unspecified atrial fibrillation: Secondary | ICD-10-CM

## 2012-01-08 DIAGNOSIS — I5032 Chronic diastolic (congestive) heart failure: Secondary | ICD-10-CM

## 2012-01-08 DIAGNOSIS — I509 Heart failure, unspecified: Secondary | ICD-10-CM | POA: Diagnosis present

## 2012-01-08 DIAGNOSIS — E860 Dehydration: Secondary | ICD-10-CM

## 2012-01-08 DIAGNOSIS — D72829 Elevated white blood cell count, unspecified: Secondary | ICD-10-CM

## 2012-01-08 DIAGNOSIS — L02419 Cutaneous abscess of limb, unspecified: Secondary | ICD-10-CM

## 2012-01-08 HISTORY — DX: Other displaced fracture of upper end of left humerus, initial encounter for closed fracture: S42.292A

## 2012-01-08 HISTORY — DX: Pneumonia, unspecified organism: J18.9

## 2012-01-08 LAB — URINALYSIS, ROUTINE W REFLEX MICROSCOPIC
Leukocytes, UA: NEGATIVE
Nitrite: NEGATIVE
Specific Gravity, Urine: 1.013 (ref 1.005–1.030)
pH: 7 (ref 5.0–8.0)

## 2012-01-08 LAB — DIFFERENTIAL
Basophils Absolute: 0 10*3/uL (ref 0.0–0.1)
Eosinophils Absolute: 0.2 10*3/uL (ref 0.0–0.7)
Lymphocytes Relative: 47 % — ABNORMAL HIGH (ref 12–46)
Lymphs Abs: 8.1 10*3/uL — ABNORMAL HIGH (ref 0.7–4.0)
Neutro Abs: 7.7 10*3/uL (ref 1.7–7.7)

## 2012-01-08 LAB — CBC
HCT: 33.9 % — ABNORMAL LOW (ref 36.0–46.0)
MCHC: 33 g/dL (ref 30.0–36.0)
MCV: 96 fL (ref 78.0–100.0)
Platelets: 261 10*3/uL (ref 150–400)
RDW: 16.8 % — ABNORMAL HIGH (ref 11.5–15.5)
WBC: 17.2 10*3/uL — ABNORMAL HIGH (ref 4.0–10.5)

## 2012-01-08 LAB — CARDIAC PANEL(CRET KIN+CKTOT+MB+TROPI): Total CK: 81 U/L (ref 7–177)

## 2012-01-08 LAB — COMPREHENSIVE METABOLIC PANEL
AST: 19 U/L (ref 0–37)
Albumin: 3.6 g/dL (ref 3.5–5.2)
BUN: 26 mg/dL — ABNORMAL HIGH (ref 6–23)
Creatinine, Ser: 0.9 mg/dL (ref 0.50–1.10)
Total Protein: 6.4 g/dL (ref 6.0–8.3)

## 2012-01-08 LAB — LACTIC ACID, PLASMA: Lactic Acid, Venous: 1.3 mmol/L (ref 0.5–2.2)

## 2012-01-08 MED ORDER — DILTIAZEM HCL 60 MG PO TABS
120.0000 mg | ORAL_TABLET | Freq: Every day | ORAL | Status: DC
  Administered 2012-01-09: 120 mg via ORAL
  Filled 2012-01-08 (×2): qty 2

## 2012-01-08 MED ORDER — SODIUM CHLORIDE 0.9 % IV SOLN
INTRAVENOUS | Status: DC
  Administered 2012-01-08: 17:00:00 via INTRAVENOUS

## 2012-01-08 MED ORDER — HEPARIN SODIUM (PORCINE) 5000 UNIT/ML IJ SOLN
5000.0000 [IU] | Freq: Three times a day (TID) | INTRAMUSCULAR | Status: DC
  Administered 2012-01-08 – 2012-01-13 (×15): 5000 [IU] via SUBCUTANEOUS
  Filled 2012-01-08 (×17): qty 1

## 2012-01-08 MED ORDER — PANTOPRAZOLE SODIUM 40 MG PO TBEC
40.0000 mg | DELAYED_RELEASE_TABLET | Freq: Every day | ORAL | Status: DC
  Administered 2012-01-09 – 2012-01-14 (×6): 40 mg via ORAL
  Filled 2012-01-08 (×6): qty 1

## 2012-01-08 MED ORDER — SODIUM CHLORIDE 0.9 % IJ SOLN
3.0000 mL | Freq: Two times a day (BID) | INTRAMUSCULAR | Status: DC
  Administered 2012-01-08 – 2012-01-14 (×8): 3 mL via INTRAVENOUS

## 2012-01-08 MED ORDER — CLOPIDOGREL BISULFATE 75 MG PO TABS
75.0000 mg | ORAL_TABLET | Freq: Every day | ORAL | Status: DC
  Administered 2012-01-09 – 2012-01-14 (×6): 75 mg via ORAL
  Filled 2012-01-08 (×8): qty 1

## 2012-01-08 MED ORDER — POLYSACCHARIDE IRON COMPLEX 150 MG PO CAPS
150.0000 mg | ORAL_CAPSULE | Freq: Every day | ORAL | Status: DC
  Administered 2012-01-09 – 2012-01-14 (×6): 150 mg via ORAL
  Filled 2012-01-08 (×8): qty 1

## 2012-01-08 MED ORDER — DEXTROSE 5 % IV SOLN
2.0000 g | Freq: Three times a day (TID) | INTRAVENOUS | Status: DC
  Administered 2012-01-08 – 2012-01-11 (×8): 2 g via INTRAVENOUS
  Filled 2012-01-08 (×10): qty 2

## 2012-01-08 MED ORDER — FUROSEMIDE 10 MG/ML IJ SOLN
40.0000 mg | Freq: Once | INTRAMUSCULAR | Status: AC
  Administered 2012-01-08: 40 mg via INTRAVENOUS
  Filled 2012-01-08: qty 4

## 2012-01-08 MED ORDER — VANCOMYCIN HCL IN DEXTROSE 1-5 GM/200ML-% IV SOLN
1000.0000 mg | INTRAVENOUS | Status: DC
  Administered 2012-01-09 – 2012-01-12 (×4): 1000 mg via INTRAVENOUS
  Filled 2012-01-08 (×4): qty 200

## 2012-01-08 MED ORDER — ONDANSETRON HCL 4 MG/2ML IJ SOLN: 4.0000 mg | Freq: Four times a day (QID) | INTRAMUSCULAR | Status: DC | PRN

## 2012-01-08 MED ORDER — ATORVASTATIN CALCIUM 20 MG PO TABS
20.0000 mg | ORAL_TABLET | ORAL | Status: DC
  Administered 2012-01-09 – 2012-01-13 (×3): 20 mg via ORAL
  Filled 2012-01-08 (×3): qty 1

## 2012-01-08 MED ORDER — CARVEDILOL 12.5 MG PO TABS
12.5000 mg | ORAL_TABLET | Freq: Two times a day (BID) | ORAL | Status: DC
  Administered 2012-01-09 – 2012-01-12 (×7): 12.5 mg via ORAL
  Filled 2012-01-08 (×9): qty 1

## 2012-01-08 MED ORDER — VANCOMYCIN HCL IN DEXTROSE 1-5 GM/200ML-% IV SOLN
1000.0000 mg | Freq: Once | INTRAVENOUS | Status: AC
  Administered 2012-01-08: 1000 mg via INTRAVENOUS
  Filled 2012-01-08: qty 200

## 2012-01-08 MED ORDER — LEVALBUTEROL HCL 0.63 MG/3ML IN NEBU
0.6300 mg | INHALATION_SOLUTION | Freq: Four times a day (QID) | RESPIRATORY_TRACT | Status: DC
  Administered 2012-01-09: 0.63 mg via RESPIRATORY_TRACT
  Filled 2012-01-08 (×7): qty 3

## 2012-01-08 MED ORDER — CLOBETASOL PROPIONATE 0.05 % EX CREA
TOPICAL_CREAM | Freq: Two times a day (BID) | CUTANEOUS | Status: DC
  Administered 2012-01-08 – 2012-01-12 (×9): via TOPICAL
  Administered 2012-01-13: 1 via TOPICAL
  Administered 2012-01-13 – 2012-01-14 (×2): via TOPICAL
  Filled 2012-01-08 (×2): qty 15

## 2012-01-08 MED ORDER — FUROSEMIDE 40 MG PO TABS
40.0000 mg | ORAL_TABLET | Freq: Every morning | ORAL | Status: DC
  Filled 2012-01-08: qty 1

## 2012-01-08 MED ORDER — LEVOFLOXACIN IN D5W 750 MG/150ML IV SOLN
750.0000 mg | INTRAVENOUS | Status: AC
  Administered 2012-01-08 – 2012-01-10 (×2): 750 mg via INTRAVENOUS
  Filled 2012-01-08 (×2): qty 150

## 2012-01-08 MED ORDER — ASPIRIN EC 81 MG PO TBEC
162.0000 mg | DELAYED_RELEASE_TABLET | Freq: Every day | ORAL | Status: DC
  Administered 2012-01-09 – 2012-01-14 (×6): 162 mg via ORAL
  Filled 2012-01-08 (×6): qty 2

## 2012-01-08 MED ORDER — ISOSORB DINITRATE-HYDRALAZINE 20-37.5 MG PO TABS
0.5000 | ORAL_TABLET | Freq: Two times a day (BID) | ORAL | Status: DC
  Administered 2012-01-08 – 2012-01-09 (×3): 0.5 via ORAL
  Administered 2012-01-10: 10:00:00 via ORAL
  Administered 2012-01-10 – 2012-01-14 (×8): 0.5 via ORAL
  Filled 2012-01-08 (×13): qty 0.5

## 2012-01-08 MED ORDER — POTASSIUM CHLORIDE CRYS ER 20 MEQ PO TBCR
20.0000 meq | EXTENDED_RELEASE_TABLET | Freq: Every day | ORAL | Status: DC
  Administered 2012-01-09 – 2012-01-14 (×6): 20 meq via ORAL
  Filled 2012-01-08 (×6): qty 1

## 2012-01-08 MED ORDER — FOLIC ACID 1 MG PO TABS
1.0000 mg | ORAL_TABLET | Freq: Every day | ORAL | Status: DC
  Administered 2012-01-09 – 2012-01-14 (×6): 1 mg via ORAL
  Filled 2012-01-08 (×6): qty 1

## 2012-01-08 MED ORDER — ACETAMINOPHEN 325 MG PO TABS: 650.0000 mg | ORAL_TABLET | Freq: Four times a day (QID) | ORAL | Status: DC | PRN

## 2012-01-08 MED ORDER — CLOBETASOL PROPIONATE 0.05 % EX SOLN: 1.0000 "application " | Freq: Two times a day (BID) | CUTANEOUS | Status: DC

## 2012-01-08 MED ORDER — OXYCODONE HCL 5 MG PO TABS
5.0000 mg | ORAL_TABLET | ORAL | Status: DC | PRN
  Administered 2012-01-08 – 2012-01-09 (×3): 5 mg via ORAL
  Filled 2012-01-08 (×3): qty 1

## 2012-01-08 NOTE — ED Provider Notes (Signed)
History     CSN: [id number]  Arrival date & time 01/08/12  1417   First MD Initiated Contact with Patient 01/08/12 1506      Chief Complaint  Patient presents with  . Shortness of Breath    (Consider location/radiation/quality/duration/timing/severity/associated sxs/prior treatment) HPI Patient is a 78 year old female who presents today complaining of fever as well as shortness of breath. [facility]-ray was done today that was concerning for possible pneumonia versus CHF exacerbation. Patient was last hospitalized in March. She denies any cough. Patient is afebrile and hemodynamically stable on presentation. She denies any nausea, abdominal pain, vomiting, diarrhea, or urinary symptoms. Patient does have bilateral lower extremity erythema. Patient has several open ulcers on the lower Shoney's as well. Patient complains of significant pain along the right lateral malleolus where one of these lesions is located. She's not currently on any antibiotics for this. She's been followed by her dermatologist and been placed on a couple of different antibiotics without improvement. She has been referred to [facility] as well as to wound care clinic but has not yet been seen. Patient says pain is worse with palpation of these areas and better with rest. She's been taking all her medications. There are no other associated or modifying factors. Past Medical History  Diagnosis Date  . Hypertension   . Atrial fibrillation   . Psoriasis   . Congestive heart failure (CHF)   . Basal cell carcinoma   . History of CVA (cerebrovascular accident)   . Coronary artery disease   . Myocardial infarction   . CHF (congestive heart failure)   . Stroke   . Shortness of breath   . H/O hiatal hernia     Past Surgical History  Procedure Date  . Tonsillectomy   . Dilation and curettage of uterus   . Eye surgery   . Cataracts     Family History  Problem Relation Age of  Onset  . Stomach cancer Mother   . Pneumonia Father     History  Substance Use Topics  . Smoking status: Never Smoker   . Smokeless tobacco: Never Used  . Alcohol Use: No    OB History    Grav Para Term Preterm Abortions TAB SAB Ect Mult Living                  Review of Systems  Constitutional: Positive for fever and fatigue.  HENT: Negative.   Eyes: Negative.   Respiratory: Positive for shortness of breath.   Cardiovascular: Negative.   Gastrointestinal: Negative.   Genitourinary: Negative.   Musculoskeletal: Negative.   Skin: Positive for rash and wound.  Neurological: Negative.   Hematological: Negative.   Psychiatric/Behavioral: Negative.   All other systems reviewed and are negative.    Allergies  Penicillins; Triamterene; and Fosamax  Home Medications   Current Outpatient Rx  Name Route Sig Dispense Refill  . ACETAMINOPHEN 500 MG PO TABS Oral Take 500 mg by mouth every 4 (four) hours as needed. Pain.    . ASPIRIN EC 81 MG PO TBEC Oral Take 162 mg by mouth daily.    Marland Kitchen CARVEDILOL 12.5 MG PO TABS Oral Take 12.5 mg by mouth 2 (two) times daily with a meal.    . CLOBETASOL PROPIONATE 0.05 % EX SOLN Topical Apply 1 application topically 2 (two) times daily. Apply to affected areas for psoriasis.    Marland Kitchen CLOPIDOGREL BISULFATE 75 MG PO TABS Oral Take  75 mg by mouth daily.    . CO Q 10 100 MG PO CAPS Oral Take 1 capsule by mouth daily.    Marland Kitchen DILTIAZEM HCL 120 MG PO TABS Oral Take 120 mg by mouth daily.    . STOOL SOFTENER PO Oral Take 1 capsule by mouth daily.    Marland Kitchen HYLATOPIC EX FOAM Apply externally Apply 1 application topically 2 (two) times daily as needed. Psoriasis.    Marland Kitchen FOLIC ACID 1 MG PO TABS Oral Take 1 mg by mouth daily.    . FUROSEMIDE 40 MG PO TABS Oral Take 40 mg by mouth every morning.    Marland Kitchen POLYSACCHARIDE IRON COMPLEX 150 MG PO CAPS Oral Take 1 capsule (150 mg total) by mouth daily with breakfast.    . ISOSORB DINITRATE-HYDRALAZINE 20-37.5 MG PO TABS Oral  Take 0.5 tablets by mouth 2 (two) times daily.    Marland Kitchen LISINOPRIL 40 MG PO TABS Oral Take 20 mg by mouth daily.    Marland Kitchen MAGNESIUM PO Oral Take 1 tablet by mouth daily. 500mg     . METHOTREXATE 2.5 MG PO TABS Oral Take 2.5 mg by mouth 3 (three) times a week. Caution:Chemotherapy. Protect from light.    Marland Kitchen FISH OIL 1000 MG PO CAPS Oral Take 2,000 mg by mouth daily.    . OXYCODONE HCL 5 MG PO TABS Oral Take 5-10 mg by mouth See admin instructions. Take 5mg  by mouth every 4 hours as needed for mild pain. Take 10mg  by mouth every 4 hours as needed for severe pain.    Marland Kitchen PANTOPRAZOLE SODIUM 40 MG PO TBEC Oral Take 40 mg by mouth daily.    Marland Kitchen POTASSIUM CHLORIDE CRYS ER 20 MEQ PO TBCR Oral Take 20 mEq by mouth daily.    Marland Kitchen ROSUVASTATIN CALCIUM 20 MG PO TABS Oral Take 10 mg by mouth every other day.    . TRIAMCINOLONE ACETONIDE 0.1 % EX OINT Topical Apply 1 application topically 2 (two) times daily. To legs and feet for psoriasis.    Marland Kitchen VITAMIN C 500 MG PO TABS Oral Take 500 mg by mouth daily.      BP 124/68  Pulse 61  Temp(Src) 99 F (37.2 C) (Oral)  Resp 20  Ht 5\' 5"  (1.651 m)  Wt 140 lb (63.504 kg)  BMI 23.30 kg/m2  SpO2 96%  Physical Exam  Nursing note and vitals reviewed. GEN: Well-developed, well-nourished female in no distress HEENT: Atraumatic, normocephalic. Oropharynx clear without erythema EYES: PERRLA BL, no scleral icterus. NECK: Trachea midline, no meningismus CV: regular rate and rhythm. No murmurs, rubs, or gallops PULM: No respiratory distress.  No crackles, wheezes, or rales. GI: soft, non-tender. No guarding, rebound, or tenderness. + bowel sounds  GU: deferred Neuro: cranial nerves 2-12 intact, no abnormalities of strength or sensation, A and O x 3 MSK: Patient moves all 4 extremities symmetrically, erythema over the bilateral lower legs below the knee. Patient with trace pitting edema bilaterally. She does have several open areas including a 1 cm in diameter ulceration of the right  lateral malleolus. Presentation is concerning for cellulitis.  Skin: No petechiae, purpura, or jaundice.  Please see musculoskeletal exam for details. Psych: no abnormality of mood   ED Course  Procedures (including critical care time)   Date: 01/08/2012  Rate: 61  Rhythm: normal sinus rhythm  QRS Axis: normal  Intervals: normal  ST/T Wave abnormalities: normal  Conduction Disutrbances:none  Narrative Interpretation:   Old EKG Reviewed: unchanged   Labs  Reviewed  CBC - Abnormal; Notable for the following:    WBC 17.2 (*)    RBC 3.53 (*)    Hemoglobin 11.2 (*)    HCT 33.9 (*)    RDW 16.8 (*)    All other components within normal limits  DIFFERENTIAL - Abnormal; Notable for the following:    Lymphocytes Relative 47 (*)    Lymphs Abs 8.1 (*)    Monocytes Absolute 1.2 (*)    All other components within normal limits  COMPREHENSIVE METABOLIC PANEL - Abnormal; Notable for the following:    Glucose, Bld 109 (*)    BUN 26 (*)    GFR calc non Af Amer 55 (*)    GFR calc Af Amer 63 (*)    All other components within normal limits  PRO B NATRIURETIC PEPTIDE - Abnormal; Notable for the following:    Pro B Natriuretic peptide (BNP) 2880.0 (*)    All other components within normal limits  LACTIC ACID, PLASMA  URINALYSIS, ROUTINE W REFLEX MICROSCOPIC  URINE CULTURE  PATHOLOGIST SMEAR REVIEW  WOUND CULTURE   Dg Chest 2 View  01/08/2012  *RADIOLOGY REPORT*  Clinical Data: Shortness of breath.  CHEST - 2 VIEW  Comparison: 12/07/2011.  Findings: Borderline enlarged cardiac silhouette with an interval decrease in size.  Interval small bilateral pleural effusions and small amount of linear density at both lung bases.  Diffuse osteopenia.  Stable thoracolumbar vertebral compression fracture and vertebroplasty material.  IMPRESSION: Interval small bilateral pleural effusions and mild bibasilar atelectasis.  Original Report Authenticated By: Darrol Angel, M.D.     1. Cellulitis and  abscess of leg   2. Dehydration       MDM  Patient was evaluated by myself. Based on evaluation patient had workup for possible causes of fever. Chest x-ray did show 2 new small pleural effusions. Patient had a white count of 17.2. Her BNP was slightly elevated but patient actually appeared intravascularly dry with an elevated BUN to creatinine ratio. Urinalysis was unremarkable and there is no infiltrate on chest x-ray. EKG and troponin were within normal limits. Patient remained hemodynamically stable and afebrile here. Given her white count as well as her clinical exam I was concerned for cellulitis. Vancomycin was ordered and blood cultures were drawn. Patient was admitted to the hospitalist. We discussed that I did not start lasix on the patient as she did appear to be intravascularly dry. Patient will be admitted for further management.        Cyndra Numbers, MD 01/08/12 2125

## 2012-01-08 NOTE — Progress Notes (Signed)
ANTIBIOTIC CONSULT NOTE - INITIAL  Pharmacy Consult for vancomycin, aztreonam, levofloxacin Indication: HCAP, cellulitis  Allergies  Allergen Reactions  . Penicillins Other (See Comments)    unknown  . Triamterene Other (See Comments)    unknown  . Fosamax Rash    Patient Measurements: Height: 5\' 5"  (165.1 cm) Weight: 140 lb (63.504 kg) IBW/kg (Calculated) : 57   Vital Signs: Temp: 99 F (37.2 C) (04/27 1425) Temp src: Oral (04/27 1425) BP: 124/68 mmHg (04/27 1425) Pulse Rate: 61  (04/27 1425) Intake/Output from previous day:   Intake/Output from this shift:    Labs:  Basename 01/08/12 1525  WBC 17.2*  HGB 11.2*  PLT 261  LABCREA --  CREATININE 0.90   Estimated Creatinine Clearance: 37.4 ml/min (by C-G formula based on Cr of 0.9). No results found for this basename: VANCOTROUGH:2,VANCOPEAK:2,VANCORANDOM:2,GENTTROUGH:2,GENTPEAK:2,GENTRANDOM:2,TOBRATROUGH:2,TOBRAPEAK:2,TOBRARND:2,AMIKACINPEAK:2,AMIKACINTROU:2,AMIKACIN:2, in the last 72 hours   Microbiology: No results found for this or any previous visit (from the past 720 hour(s)).  Medical History: Past Medical History  Diagnosis Date  . Hypertension   . Atrial fibrillation   . Psoriasis   . Congestive heart failure (CHF)   . Basal cell carcinoma   . History of CVA (cerebrovascular accident)   . Coronary artery disease   . Myocardial infarction   . CHF (congestive heart failure)   . Stroke   . Shortness of breath   . H/O hiatal hernia     Medications:  Scheduled:    . aspirin EC  162 mg Oral Daily  . atorvastatin  20 mg Oral QODAY  . aztreonam  2 g Intravenous Q8H  . carvedilol  12.5 mg Oral BID WC  . clobetasol  1 application Topical BID  . clopidogrel  75 mg Oral Daily  . diltiazem  120 mg Oral Daily  . folic acid  1 mg Oral Daily  . furosemide  40 mg Intravenous Once  . furosemide  40 mg Oral q morning - 10a  . heparin  5,000 Units Subcutaneous Q8H  . iron polysaccharides  150 mg Oral Q  breakfast  . isosorbide-hydrALAZINE  0.5 tablet Oral BID  . levalbuterol  0.63 mg Nebulization Q6H  . levofloxacin (LEVAQUIN) IV  750 mg Intravenous Q24H  . oxyCODONE  5-10 mg Oral See admin instructions  . pantoprazole  40 mg Oral Daily  . potassium chloride SA  20 mEq Oral Daily  . sodium chloride  3 mL Intravenous Q12H  . vancomycin  1,000 mg Intravenous Once   Infusions:    . DISCONTD: sodium chloride 125 mL/hr at 01/08/12 1701   Assessment: 78 yo female admitted with pain in right ankle and cough with SOB was discharged from [facility] in March s/p CVA to SNF. Patient found to have cellulitis today and presumed HCAP to start vancomycin, Levaquin and aztreonam per pharmacy dosing  Goal of Therapy:  Vancomycin trough level 15-20 mcg/ml, Aztreonam and Levaquin adjustments per renal function  Plan:  1. Vancomycin 1g IV x1 given in ER at 1803. Start vancomycin 1g IV q24 thereafter. Will check a trough at steady state 2. Aztreonam 2g IV q8 for CrCl > 30 ml/min 3. Levaquin 750mg  IV q48 for CrCl < [address] PharmD, [place] Pager [phone] 01/08/2012 8:41 PM

## 2012-01-08 NOTE — H&P (Signed)
Sara [name] is an 78 y.o. female.    PCP: Londell Moh, MD, MD   Chief Complaint: Pain in the right ankle and cough with shortness of breath  HPI: This is a 78 year old, Caucasian female, who was admitted to [facility] in March for a stroke. She was discharged to skilled nursing facility and then subsequently went to her independent living facility a few days ago. According to the daughter, who accompanies her patient's psoriasis has been getting worse. They have an appointment to see a dermatologist at the [facility]. According to the patient and the daughter she has been finding it difficult to walk for the last week and a half because of pain in the right ankle. She has noticed increased redness and increased drainage from the wound in the right ankle. All the symptoms have gotten worse over the last one month since discharge from [facility]. I actually remember this patient from that admission. Patient did not have a draining wound at that time.  This morning patient's daughter noticed that patient appeared to be wheezing. There was a "squeaking" sound coming from her chest. Patient felt a little [name] of breath. She had dry cough. She felt like she was very tired and exhausted. Nurse was called. The nurse called the on-call physician and a chest x-ray was obtained. In the chest x-ray concern was raised about a pneumonia in the left lower lobe. There were also small pleural effusions bilaterally, raising concern about congestive heart failure. Because of these findings patient was sent over to the emergency department. She also mentions some fullness in her chest, but denies any pain per se. Denies any chills, had a low-grade fever of 99.6. No nausea, vomiting, recently. She is mainly concerned about the pain in her right ankle.   Home Medications: Prior to Admission medications   Medication Sig Start Date End Date Taking? Authorizing Provider    acetaminophen (TYLENOL) 500 MG tablet Take 500 mg by mouth every 4 (four) hours as needed. Pain.   Yes Historical Provider, MD  aspirin EC 81 MG tablet Take 162 mg by mouth daily.   Yes Historical Provider, MD  carvedilol (COREG) 12.5 MG tablet Take 12.5 mg by mouth 2 (two) times daily with a meal.   Yes Historical Provider, MD  clobetasol (TEMOVATE) 0.05 % external solution Apply 1 application topically 2 (two) times daily. Apply to affected areas for psoriasis.   Yes Historical Provider, MD  clopidogrel (PLAVIX) 75 MG tablet Take 75 mg by mouth daily.   Yes Historical Provider, MD  Coenzyme Q10 (CO Q 10) 100 MG CAPS Take 1 capsule by mouth daily.   Yes Historical Provider, MD  diltiazem (CARDIZEM) 120 MG tablet Take 120 mg by mouth daily.   Yes Historical Provider, MD  Docusate Calcium (STOOL SOFTENER PO) Take 1 capsule by mouth daily.   Yes Historical Provider, MD  Emollient (HYLATOPIC) FOAM Apply 1 application topically 2 (two) times daily as needed. Psoriasis.   Yes Historical Provider, MD  folic acid (FOLVITE) 1 MG tablet Take 1 mg by mouth daily.   Yes Historical Provider, MD  furosemide (LASIX) 40 MG tablet Take 40 mg by mouth every morning.   Yes Historical Provider, MD  iron polysaccharides (NIFEREX) 150 MG capsule Take 1 capsule (150 mg total) by mouth daily with breakfast. 12/10/11 12/09/12 Yes Osvaldo Shipper, MD  isosorbide-hydrALAZINE (BIDIL) 20-37.5 MG per tablet Take 0.5 tablets by mouth 2 (two) times daily.  Yes Historical Provider, MD  lisinopril (PRINIVIL,ZESTRIL) 40 MG tablet Take 20 mg by mouth daily.   Yes Historical Provider, MD  MAGNESIUM PO Take 1 tablet by mouth daily. 500mg    Yes Historical Provider, MD  methotrexate (RHEUMATREX) 2.5 MG tablet Take 2.5 mg by mouth 3 (three) times a week. Caution:Chemotherapy. Protect from light.   Yes Historical Provider, MD  Omega-3 Fatty Acids (FISH OIL) 1000 MG CAPS Take 2,000 mg by mouth daily.   Yes Historical Provider, MD  oxyCODONE  (OXY IR/ROXICODONE) 5 MG immediate release tablet Take 5-10 mg by mouth See admin instructions. Take 5mg  by mouth every 4 hours as needed for mild pain. Take 10mg  by mouth every 4 hours as needed for severe pain.   Yes Historical Provider, MD  pantoprazole (PROTONIX) 40 MG tablet Take 40 mg by mouth daily.   Yes Historical Provider, MD  potassium chloride SA (K-DUR,KLOR-CON) 20 MEQ tablet Take 20 mEq by mouth daily.   Yes Historical Provider, MD  rosuvastatin (CRESTOR) 20 MG tablet Take 10 mg by mouth every other day.   Yes Historical Provider, MD  triamcinolone ointment (KENALOG) 0.1 % Apply 1 application topically 2 (two) times daily. To legs and feet for psoriasis.   Yes Historical Provider, MD  vitamin C (ASCORBIC ACID) 500 MG tablet Take 500 mg by mouth daily.   Yes Historical Provider, MD    Allergies:  Allergies  Allergen Reactions  . Penicillins Other (See Comments)    unknown  . Triamterene Other (See Comments)    unknown  . Fosamax Rash    Past Medical History: Past Medical History  Diagnosis Date  . Hypertension   . Atrial fibrillation   . Psoriasis   . Congestive heart failure (CHF)   . Basal cell carcinoma   . History of CVA (cerebrovascular accident)   . Coronary artery disease   . Myocardial infarction   . CHF (congestive heart failure)   . Stroke   . Shortness of breath   . H/O hiatal hernia     Past Surgical History  Procedure Date  . Tonsillectomy   . Dilation and curettage of uterus   . Eye surgery   . Cataracts     Social History:  reports that she has never smoked. She has never used smokeless tobacco. She reports that she does not drink alcohol or use illicit drugs.  Family History:  Family History  Problem Relation Age of Onset  . Stomach cancer Mother   . Pneumonia Father     Review of Systems - History obtained from the patient General ROS: positive for  - fatigue Psychological ROS: negative Ophthalmic ROS: negative ENT ROS:  negative Allergy and Immunology ROS: negative Hematological and Lymphatic ROS: negative Endocrine ROS: negative Respiratory ROS: as in hpi Cardiovascular ROS: as in hpi Gastrointestinal ROS: negative Genito-Urinary ROS: negative Musculoskeletal ROS: as in hpi Neurological ROS: negative Dermatological ROS: as in hpi  Physical Examination Blood pressure 124/68, pulse 61, temperature 99 F (37.2 C), temperature source Oral, resp. rate 20, height 5\' 5"  (1.651 m), weight 63.504 kg (140 lb), SpO2 96.00%.  General appearance: alert, cooperative and no distress Head: Normocephalic, without obvious abnormality, atraumatic Eyes: conjunctivae/corneas clear. PERRL, EOM's intact. Throat: lips, mucosa, and tongue normal; teeth and gums normal Neck: no adenopathy, no carotid bruit, no JVD, supple, symmetrical, trachea midline and thyroid not enlarged, symmetric, no tenderness/mass/nodules Back: symmetric, no curvature. ROM normal. No CVA tenderness. Resp: dullness to percussion at bases, crackles  bilaterally, no wheezing Cardio: regular rate and rhythm, S1, S2 normal, no murmur, click, rub or gallop GI: soft, non-tender; bowel sounds normal; no masses,  no organomegaly Extremities: decreased ROM right ankle. Ulcer in the lateral malleolus with yeloow drainage. Pulses: 2+ and symmetric Skin: dry scaly skin, redness over the legs. Lymph nodes: Cervical, supraclavicular, and axillary nodes normal. Neurologic: Grossly normal. No focal deficits.  Laboratory Data: Results for orders placed during the hospital encounter of 01/08/12 (from the past 48 hour(s))  CBC     Status: Abnormal   Collection Time   01/08/12  3:25 PM      Component Value Range Comment   WBC 17.2 (*) 4.0 - 10.5 (K/uL)    RBC 3.53 (*) 3.87 - 5.11 (MIL/uL)    Hemoglobin 11.2 (*) 12.0 - 15.0 (g/dL)    HCT 16.1 (*) 09.6 - 46.0 (%)    MCV 96.0  78.0 - 100.0 (fL)    MCH 31.7  26.0 - 34.0 (pg)    MCHC 33.0  30.0 - 36.0 (g/dL)    RDW  04.5 (*) 40.9 - 15.5 (%)    Platelets 261  150 - 400 (K/uL)   DIFFERENTIAL     Status: Abnormal   Collection Time   01/08/12  3:25 PM      Component Value Range Comment   Neutrophils Relative 45  43 - 77 (%)    Lymphocytes Relative 47 (*) 12 - 46 (%)    Monocytes Relative 7  3 - 12 (%)    Eosinophils Relative 1  0 - 5 (%)    Basophils Relative 0  0 - 1 (%)    Neutro Abs 7.7  1.7 - 7.7 (K/uL)    Lymphs Abs 8.1 (*) 0.7 - 4.0 (K/uL)    Monocytes Absolute 1.2 (*) 0.1 - 1.0 (K/uL)    Eosinophils Absolute 0.2  0.0 - 0.7 (K/uL)    Basophils Absolute 0.0  0.0 - 0.1 (K/uL)    WBC Morphology ATYPICAL LYMPHOCYTES     COMPREHENSIVE METABOLIC PANEL     Status: Abnormal   Collection Time   01/08/12  3:25 PM      Component Value Range Comment   Sodium 135  135 - 145 (mEq/L)    Potassium 4.4  3.5 - 5.1 (mEq/L)    Chloride 99  96 - 112 (mEq/L)    CO2 28  19 - 32 (mEq/L)    Glucose, Bld 109 (*) 70 - 99 (mg/dL)    BUN 26 (*) 6 - 23 (mg/dL)    Creatinine, Ser 8.11  0.50 - 1.10 (mg/dL)    Calcium 9.1  8.4 - 10.5 (mg/dL)    Total Protein 6.4  6.0 - 8.3 (g/dL)    Albumin 3.6  3.5 - 5.2 (g/dL)    AST 19  0 - 37 (U/L)    ALT 25  0 - 35 (U/L)    Alkaline Phosphatase 52  39 - 117 (U/L)    Total Bilirubin 0.3  0.3 - 1.2 (mg/dL)    GFR calc non Af Amer 55 (*) >90 (mL/min)    GFR calc Af Amer 63 (*) >90 (mL/min)   LACTIC ACID, PLASMA     Status: Normal   Collection Time   01/08/12  3:25 PM      Component Value Range Comment   Lactic Acid, Venous 1.3  0.5 - 2.2 (mmol/L)   PRO B NATRIURETIC PEPTIDE     Status: Abnormal  Collection Time   01/08/12  3:25 PM      Component Value Range Comment   Pro B Natriuretic peptide (BNP) 2880.0 (*) 0 - 450 (pg/mL)   URINALYSIS, ROUTINE W REFLEX MICROSCOPIC     Status: Normal   Collection Time   01/08/12  4:05 PM      Component Value Range Comment   Color, Urine YELLOW  YELLOW     APPearance CLEAR  CLEAR     Specific Gravity, Urine 1.013  1.005 - 1.030     pH 7.0   5.0 - 8.0     Glucose, UA NEGATIVE  NEGATIVE (mg/dL)    Hgb urine dipstick NEGATIVE  NEGATIVE     Bilirubin Urine NEGATIVE  NEGATIVE     Ketones, ur NEGATIVE  NEGATIVE (mg/dL)    Protein, ur NEGATIVE  NEGATIVE (mg/dL)    Urobilinogen, UA 0.2  0.0 - 1.0 (mg/dL)    Nitrite NEGATIVE  NEGATIVE     Leukocytes, UA NEGATIVE  NEGATIVE  MICROSCOPIC NOT DONE ON URINES WITH NEGATIVE PROTEIN, BLOOD, LEUKOCYTES, NITRITE, OR GLUCOSE <1000 mg/dL.  POCT I-STAT TROPONIN I     Status: Normal   Collection Time   01/08/12  5:41 PM      Component Value Range Comment   Troponin i, poc 0.04  0.00 - 0.08 (ng/mL)    Comment 3              Radiology Reports: Dg Chest 2 View  01/08/2012  *RADIOLOGY REPORT*  Clinical Data: Shortness of breath.  CHEST - 2 VIEW  Comparison: 12/07/2011.  Findings: Borderline enlarged cardiac silhouette with an interval decrease in size.  Interval small bilateral pleural effusions and small amount of linear density at both lung bases.  Diffuse osteopenia.  Stable thoracolumbar vertebral compression fracture and vertebroplasty material.  IMPRESSION: Interval small bilateral pleural effusions and mild bibasilar atelectasis.  Original Report Authenticated By: Darrol Angel, M.D.    Electrocardiogram: Sinus rhythm at 61 beats per minute. Normal axis. Normal Intervals. No Q waves. No concerning ST or T-wave changes are noted.  Assessment/Plan  Principal Problem:  *Cellulitis Active Problems:  Leukocytosis  Hypertension  Atrial fibrillation  Psoriasis  Chronic diastolic HF (heart failure)  HCAP (healthcare-associated pneumonia)   #1 cellulitis of the right ankle. This is superimposed on the psoriasis. Because of the ulceration present on the lateral aspect there is always a question about osteomyelitis, specially because of pain in the ankle joint. We will go ahead and proceed with MRI. We will put her on vancomycin for now.  #2 healthcare associated pneumonia: This will be treated  with broad-spectrum antibiotics. Blood cultures will be obtained. Oxygen will be provided.  #3 "Fullness" in the chest: It may be related to the pneumonia and possible exacerbation of his CHF. EKG is nonischemic. Troponin is negative. We will cycle cardiac enzymes. At this time her symptoms are not concerning for ACS or VTE.  #4 history of diastolic heart failure: The pleural effusions could suggest an acute exacerbation of the same. Intravenous Lasix will be provided for now, and she'll be reevaluated in the morning.  #5 leukocytosis: This is chronic. She was supposed to see a hematologist however, because of the stroke, that was put off. Continue to monitor.  #6 history of atrial fibrillation: Continue with her current medications. She's currently in sinus rhythm. She's not on anticoagulation.  #7 history of hypertension: Blood pressure stable. Continue to monitor.  #8 Psoriasis: Appears to have  worsened over the last month or so. She is supposed to see a dermatologist at [facility] on Monday, but will have to reschedule that appointment.  CODE STATUS. She is a DNR/DNI. This was confirmed with the patient in the presence of her daughter.  DVT, prophylaxis will be initiated.  Further management decisions will depend on results of further testing and patient's response to treatment.  [facility]  [facility] Pager [phone]  01/08/2012, 6:23 PM

## 2012-01-08 NOTE — ED Notes (Signed)
Pt reports shortness of breath, generalized weakness and lethargy in the last three days, symptoms became worse this AM.  Pt denies cough and facility stated temp was 99.6.  Pt had an x-ray completed at [facility] this AM which suggested PNA vs. CHF exacerbation. Pt was not placed on antibiotics.

## 2012-01-08 NOTE — ED Notes (Signed)
Per EMS. Pt is from [facility]. Pt has had shortness of breath, fever and MD at facility ordered x-ray with noted fluid on x-ray. Questioned PNA or CHF exacerbation.  Pt has no complaints at this time.

## 2012-01-08 NOTE — ED Notes (Signed)
MD at bedside. 

## 2012-01-08 NOTE — ED Notes (Signed)
Report attempted, RN in report at this time.

## 2012-01-08 NOTE — ED Notes (Signed)
Admitting MD at bedside.

## 2012-01-08 NOTE — ED Notes (Signed)
AVW:[id number]<XB> Expected date:<BR> Expected time:<BR> Means of arrival:<BR> Comments:<BR> Hold for Triage 2

## 2012-01-09 ENCOUNTER — Observation Stay (HOSPITAL_COMMUNITY): Payer: Medicare Other

## 2012-01-09 DIAGNOSIS — R0602 Shortness of breath: Secondary | ICD-10-CM

## 2012-01-09 DIAGNOSIS — M79609 Pain in unspecified limb: Secondary | ICD-10-CM

## 2012-01-09 LAB — CBC
HCT: 31.1 % — ABNORMAL LOW (ref 36.0–46.0)
MCV: 95.4 fL (ref 78.0–100.0)
Platelets: 264 10*3/uL (ref 150–400)
RBC: 3.26 MIL/uL — ABNORMAL LOW (ref 3.87–5.11)
RDW: 16.5 % — ABNORMAL HIGH (ref 11.5–15.5)
WBC: 16.5 10*3/uL — ABNORMAL HIGH (ref 4.0–10.5)

## 2012-01-09 LAB — COMPREHENSIVE METABOLIC PANEL
Albumin: 3.1 g/dL — ABNORMAL LOW (ref 3.5–5.2)
Alkaline Phosphatase: 46 U/L (ref 39–117)
BUN: 22 mg/dL (ref 6–23)
Creatinine, Ser: 0.94 mg/dL (ref 0.50–1.10)
GFR calc Af Amer: 60 mL/min — ABNORMAL LOW (ref >90)
Glucose, Bld: 94 mg/dL (ref 70–99)
Total Protein: 5.7 g/dL — ABNORMAL LOW (ref 6.0–8.3)

## 2012-01-09 LAB — CARDIAC PANEL(CRET KIN+CKTOT+MB+TROPI)
CK, MB: 2.5 ng/mL (ref 0.3–4.0)
CK, MB: 2.3 ng/mL (ref 0.3–4.0)
Relative Index: INVALID (ref 0.0–2.5)
Total CK: 59 U/L (ref 7–177)
Total CK: 50 U/L (ref 7–177)
Troponin I: <0.3 ng/mL (ref <0.30)

## 2012-01-09 MED ORDER — OXYCODONE HCL 5 MG PO TABS
5.0000 mg | ORAL_TABLET | ORAL | Status: DC | PRN
Start: 2012-01-09 — End: 2012-01-14
  Administered 2012-01-10: 10 mg via ORAL
  Administered 2012-01-10 – 2012-01-12 (×2): 5 mg via ORAL
  Administered 2012-01-12: 10 mg via ORAL
  Filled 2012-01-09 (×2): qty 2
  Filled 2012-01-09 (×2): qty 1

## 2012-01-09 MED ORDER — MUPIROCIN 2 % EX OINT
1.0000 "application " | TOPICAL_OINTMENT | Freq: Two times a day (BID) | CUTANEOUS | Status: AC
  Administered 2012-01-09 – 2012-01-14 (×10): 1 via NASAL
  Filled 2012-01-09 (×2): qty 22

## 2012-01-09 MED ORDER — LEVALBUTEROL HCL 0.63 MG/3ML IN NEBU
0.6300 mg | INHALATION_SOLUTION | RESPIRATORY_TRACT | Status: DC | PRN
  Filled 2012-01-09: qty 3

## 2012-01-09 MED ORDER — MENTHOL 3 MG MT LOZG
1.0000 | LOZENGE | OROMUCOSAL | Status: DC | PRN
  Administered 2012-01-09 (×2): 3 mg via ORAL
  Filled 2012-01-09: qty 9

## 2012-01-09 MED ORDER — CHLORHEXIDINE GLUCONATE CLOTH 2 % EX PADS
6.0000 | MEDICATED_PAD | Freq: Every day | CUTANEOUS | Status: DC
  Administered 2012-01-11 – 2012-01-14 (×4): 6 via TOPICAL

## 2012-01-09 MED ORDER — POLYVINYL ALCOHOL 1.4 % OP SOLN
1.0000 [drp] | OPHTHALMIC | Status: DC | PRN
  Administered 2012-01-10 – 2012-01-11 (×2): 1 [drp] via OPHTHALMIC
  Filled 2012-01-09: qty 15

## 2012-01-09 MED ORDER — FUROSEMIDE 10 MG/ML IJ SOLN
40.0000 mg | Freq: Two times a day (BID) | INTRAMUSCULAR | Status: AC
  Administered 2012-01-09 (×2): 40 mg via INTRAVENOUS
  Filled 2012-01-09 (×2): qty 4

## 2012-01-09 MED ORDER — METOPROLOL TARTRATE 1 MG/ML IV SOLN
5.0000 mg | Freq: Once | INTRAVENOUS | Status: AC
  Administered 2012-01-09: 5 mg via INTRAVENOUS
  Filled 2012-01-09: qty 5

## 2012-01-09 MED ORDER — ENSURE COMPLETE PO LIQD
237.0000 mL | Freq: Three times a day (TID) | ORAL | Status: DC
  Administered 2012-01-09 – 2012-01-14 (×7): 237 mL via ORAL

## 2012-01-09 NOTE — Consult Note (Signed)
WOC consult Note Reason for Consult: Wound on right lateral malleolus Wound type: vascular; likely mixed etiology; patient reports pain when LE is elevated (indicative of arterial insufficiency) as well as location of ulcer (malleolus) and presence of mild edema (indicative of venous insufficiency).  Suggest consideration of a VVS (vascular) consultation.  Pressure Ulcer POA: No Measurement: .6cm round x .2cm Wound bed: pale with stringy slough partially obscuring base Drainage (amount, consistency, odor)  Periwound:erythematous Dressing procedure/placement/frequency: I will place a soft silicone foam over the area to protect from further injury. Additional note:  Patient's psoriasis is a long standing problem and she had an appointment at [facility] for it on Monday, 4/29, which will now need to be rescheduled.  Our linen system, DermaTherapy, is FDA approved for the treatment of eczema and psoriasis, so no plastic chux will be used on her beds, instead giving her maximum exposure to this integumentary treatment modality. I will not follow.  Please re-consult if needed. Thanks, Ladona Mow, MSN, RN, [facility], CWOCN [phone])

## 2012-01-09 NOTE — Progress Notes (Signed)
Clinical Social Work Department BRIEF PSYCHOSOCIAL ASSESSMENT 01/09/2012  Patient:  Sara [name],Sara [name]     Account Number:  [id number]     Admit date:  01/08/2012  Clinical Social Worker:  Orpah Greek  Date/Time:  01/09/2012 02:38 PM  Referred by:  Physician  Date Referred:  01/09/2012 Referred for  Other - See comment   Other Referral:   Interview type:  Patient Other interview type:    PSYCHOSOCIAL DATA Living Status:  FACILITY Admitted from facility:  [facility] Level of care:  Independent Living Primary support name:  Sheran Spine Primary support relationship to patient:  CHILD, ADULT Degree of support available:    CURRENT CONCERNS Current Concerns  Post-Acute Placement   Other Concerns:    SOCIAL WORK ASSESSMENT / PLAN CSW meet with pt and daughter, Sara [name] at bedside regarding discharge planning. Pt was admitted from [facility] Independent living but had just discharged from [facility] 01/06/2012.   Assessment/plan status:  Psychosocial Support/Ongoing Assessment of Needs Other assessment/ plan:   Information/referral to community resources:   Awaiting PT eval to determine disposition.    PATIENT'S/FAMILY'S RESPONSE TO PLAN OF CARE: Pt seemed eagar to return to independent living.        Unice Bailey, LCSWA [phone]

## 2012-01-09 NOTE — Progress Notes (Signed)
VASCULAR LAB PRELIMINARY  PRELIMINARY  PRELIMINARY  PRELIMINARY  Bilateral lower extremity venous Dopplers completed.    Preliminary report:  There is no DVT or SVT noted in the bilateral lower extremities.  Sherren Kerns Conrad, 01/09/2012, 9:18 AM

## 2012-01-09 NOTE — Progress Notes (Signed)
PCP: Londell Moh, MD, MD  Brief HPI:  This is a 78 year old, Caucasian female, who was admitted to [facility] in March for a stroke. She was discharged to skilled nursing facility and then subsequently went to her independent living facility a few days ago. According to the daughter, who accompanies her patient's psoriasis has been getting worse. They have an appointment to see a dermatologist at the [facility]. According to the patient and the daughter she has been finding it difficult to walk for the last week and a half because of pain in the right ankle. She has noticed increased redness and increased drainage from the wound in the right ankle. All the symptoms have gotten worse over the last one month since discharge from [facility]. On the morning of admission patient's daughter noticed that patient appeared to be wheezing. There was a "squeaking" sound coming from her chest. Patient felt a little short of breath. She had dry cough. She felt like she was very tired and exhausted. Nurse was called. The nurse called the on-call physician and a chest x-ray was obtained. In the chest x-ray concern was raised about a pneumonia in the left lower lobe. There were also small pleural effusions bilaterally, raising concern about congestive heart failure. Because of these findings patient was sent over to the emergency department. She also mentions some fullness in her chest, but denies any pain per se. Denies any chills, had a low-grade fever of 99.6. No nausea, vomiting, recently. She is mainly concerned about the pain in her right ankle.   Past medical history:  Past Medical History   Diagnosis  Date   .  Hypertension    .  Atrial fibrillation    .  Psoriasis    .  Congestive heart failure (CHF)    .  Basal cell carcinoma    .  History of CVA (cerebrovascular accident)    .  Coronary artery disease    .  Myocardial infarction    .  CHF (congestive heart  failure)    .  Stroke    .  Shortness of breath    .  H/O hiatal hernia      Consultants: None  Procedures: None  Subjective: Patient feels better. Not as much pain in right ankle. Less short of breath.  Objective: Vital signs in last 24 hours: Temp:  [98.2 F (36.8 C)-99 F (37.2 C)] 98.7 F (37.1 C) (04/28 0508) Pulse Rate:  [61-86] 86  (04/28 0508) Resp:  [16-20] 18  (04/28 0508) BP: (114-142)/(62-70) 142/70 mmHg (04/28 0508) SpO2:  [93 %-96 %] 93 % (04/28 0508) Weight:  [63.504 kg (140 lb)-67.9 kg (149 lb 11.1 oz)] 67.9 kg (149 lb 11.1 oz) (04/27 2041) Weight change:  Last BM Date: 01/08/12  Intake/Output from previous day: 04/27 0701 - 04/28 0700 In: 200 [IV Piggyback:200] Out: 1200 [Urine:1200] Intake/Output this shift:    General appearance: alert, cooperative, appears stated age and no distress Head: Normocephalic, without obvious abnormality, atraumatic Resp: improved air entry, some crackles at right base Cardio: regular rate and rhythm, S1, S2 normal, no murmur, click, rub or gallop GI: soft, non-tender; bowel sounds normal; no masses,  no organomegaly Extremities: wound lateral malleolus right ankle, yellowish drainage, decreased ROM. Swelling/edema noted in right leg more than left. Pulses: poorly palpable in feet Skin: psoriatic changes most pronounced in lower extremities. Scaly skin with erythema Neurologic: Some left sided weakness (old)  Lab Results:  Basename 01/09/12 0242 01/08/12 1525  WBC 16.5* 17.2*  HGB 10.4* 11.2*  HCT 31.1* 33.9*  PLT 264 261   BMET  Basename 01/09/12 0242 01/08/12 1525  NA 138 135  K 3.8 4.4  CL 101 99  CO2 29 28  GLUCOSE 94 109*  BUN 22 26*  CREATININE 0.94 0.90  CALCIUM 8.8 9.1  ALT 21 25    Studies/Results: Dg Chest 2 View  01/08/2012  *RADIOLOGY REPORT*  Clinical Data: Shortness of breath.  CHEST - 2 VIEW  Comparison: 12/07/2011.  Findings: Borderline enlarged cardiac silhouette with an interval  decrease in size.  Interval small bilateral pleural effusions and small amount of linear density at both lung bases.  Diffuse osteopenia.  Stable thoracolumbar vertebral compression fracture and vertebroplasty material.  IMPRESSION: Interval small bilateral pleural effusions and mild bibasilar atelectasis.  Original Report Authenticated By: Darrol Angel, M.D.    Medications:  Scheduled:   . aspirin EC  162 mg Oral Daily  . atorvastatin  20 mg Oral QODAY  . aztreonam  2 g Intravenous Q8H  . carvedilol  12.5 mg Oral BID WC  . clobetasol cream   Topical BID  . clopidogrel  75 mg Oral QAC breakfast  . diltiazem  120 mg Oral Daily  . folic acid  1 mg Oral Daily  . furosemide  40 mg Intravenous Once  . furosemide  40 mg Oral q morning - 10a  . heparin  5,000 Units Subcutaneous Q8H  . iron polysaccharides  150 mg Oral Q breakfast  . isosorbide-hydrALAZINE  0.5 tablet Oral BID  . levalbuterol  0.63 mg Nebulization Q6H  . levofloxacin (LEVAQUIN) IV  750 mg Intravenous Q48H  . pantoprazole  40 mg Oral Q1200  . potassium chloride SA  20 mEq Oral Daily  . sodium chloride  3 mL Intravenous Q12H  . vancomycin  1,000 mg Intravenous Once  . vancomycin  1,000 mg Intravenous Q24H  . DISCONTD: clobetasol  1 application Topical BID    Assessment/Plan:  Principal Problem:  *Cellulitis Active Problems:  Leukocytosis  Hypertension  Atrial fibrillation  Psoriasis  Chronic diastolic HF (heart failure)  HCAP (healthcare-associated pneumonia)    #1 cellulitis of the right ankle. This is superimposed on the psoriasis. Because of the ulceration present on the lateral aspect there is concern about osteomyelitis. MRI is pending. Continue vancomycin for now. Due to swelling that is more pronounced in right leg, will get venous dopplers. Low albumin may be contributing as well.  #2 healthcare associated pneumonia: Seems some better. This will be treated with broad-spectrum antibiotics. Blood cultures  will be obtained. Oxygen will be provided. Deescalate antibiotics based on cultures. Check swallow function.  #3 "Fullness" in the chest: It is probably related to the pneumonia and possible exacerbation of his CHF. EKG is nonischemic. Troponin are negative.    #4 history of diastolic heart failure: The pleural effusions could suggest an acute exacerbation of the same. Continue with intravenous Lasix.   #5 leukocytosis: This is chronic. She was supposed to see a hematologist however, because of the stroke, that was put off. Continue to monitor.   #6 history of atrial fibrillation: Continue with her current medications. She's currently in sinus rhythm. She's not on anticoagulation.   #7 history of hypertension: Blood pressure stable. Continue to monitor.   #8 Psoriasis: Appears to have worsened over the last month or so. She is supposed to see a dermatologist at [facility] on Monday, but will have  to reschedule that appointment.   #9 Malnutrition: Start Ensure.  PT/OT to see.  CODE STATUS. She is a DNR/DNI.   DVT, prophylaxis will be initiated.     LOS: 1 day   [facility]  [facility] Pager [phone] 01/09/2012, 7:46 AM

## 2012-01-10 ENCOUNTER — Telehealth: Payer: Self-pay | Admitting: Oncology

## 2012-01-10 ENCOUNTER — Encounter (HOSPITAL_BASED_OUTPATIENT_CLINIC_OR_DEPARTMENT_OTHER): Payer: Medicare Other

## 2012-01-10 ENCOUNTER — Inpatient Hospital Stay (HOSPITAL_COMMUNITY): Payer: Medicare Other

## 2012-01-10 DIAGNOSIS — L97309 Non-pressure chronic ulcer of unspecified ankle with unspecified severity: Secondary | ICD-10-CM

## 2012-01-10 LAB — BASIC METABOLIC PANEL
CO2: 30 mEq/L (ref 19–32)
Calcium: 8.8 mg/dL (ref 8.4–10.5)
Glucose, Bld: 92 mg/dL (ref 70–99)
Potassium: 4 mEq/L (ref 3.5–5.1)
Sodium: 140 mEq/L (ref 135–145)

## 2012-01-10 LAB — CBC
Hemoglobin: 10.5 g/dL — ABNORMAL LOW (ref 12.0–15.0)
Platelets: 261 10*3/uL (ref 150–400)
RBC: 3.39 MIL/uL — ABNORMAL LOW (ref 3.87–5.11)
WBC: 15.4 10*3/uL — ABNORMAL HIGH (ref 4.0–10.5)

## 2012-01-10 MED ORDER — FUROSEMIDE 40 MG PO TABS
40.0000 mg | ORAL_TABLET | Freq: Every day | ORAL | Status: DC
  Administered 2012-01-10: 40 mg via ORAL
  Filled 2012-01-10: qty 1

## 2012-01-10 MED ORDER — DILTIAZEM HCL ER COATED BEADS 120 MG PO CP24
120.0000 mg | ORAL_CAPSULE | Freq: Every day | ORAL | Status: DC
  Administered 2012-01-10 – 2012-01-14 (×5): 120 mg via ORAL
  Filled 2012-01-10 (×5): qty 1

## 2012-01-10 MED ORDER — FUROSEMIDE 10 MG/ML IJ SOLN
40.0000 mg | Freq: Two times a day (BID) | INTRAMUSCULAR | Status: AC
  Administered 2012-01-10 – 2012-01-11 (×2): 40 mg via INTRAVENOUS
  Filled 2012-01-10 (×2): qty 4

## 2012-01-10 NOTE — Telephone Encounter (Signed)
caould not reach the pt by telephone s/w amy [facility] and the pt has been admitted back into the hospital will make the office aware

## 2012-01-10 NOTE — Evaluation (Signed)
Occupational Therapy Evaluation Patient Details Name: Sara [name] MRN: [id number] DOB: [birth year] Today's Date: 01/10/2012  OT Assessment / Plan / Recommendation Clinical Impression  Pt presents to OT with decreased I with ADL activity. Pt will benefit from OT to increase I with ADL activity and return to PLOF in apartment at [facility] home    OT Assessment  Patient needs continued OT Services    Follow Up Recommendations  Skilled nursing facility    Equipment Recommendations  Defer to next venue    Frequency Min 2X/week    Precautions / Restrictions Precautions Precautions: Fall       ADL  Where Assessed - Eating/Feeding: Chair Grooming: Simulated;Minimal assistance Where Assessed - Grooming: Supported sitting Upper Body Bathing: Simulated;Minimal assistance Where Assessed - Upper Body Bathing: Supported;Sitting, chair Lower Body Bathing: Performed;Maximal assistance Where Assessed - Lower Body Bathing: Sit to stand from bed Upper Body Dressing: Simulated;Minimal assistance Where Assessed - Upper Body Dressing: Sitting, chair;Unsupported Lower Body Dressing: Performed;Maximal assistance Where Assessed - Lower Body Dressing: Sit to stand from chair Toilet Transfer: Simulated;Other (comment) (bed to chair) Toilet Transfer Method: Stand pivot Toileting - Clothing Manipulation: Not assessed Where Assessed - Toileting Clothing Manipulation: Not assessed Toileting - Hygiene: Not assessed Where Assessed - Toileting Hygiene: Not assessed Tub/Shower Transfer: Not assessed Tub/Shower Transfer Method: Not assessed    OT Goals Acute Rehab OT Goals OT Goal Formulation: With patient Potential to Achieve Goals: Good ADL Goals Pt Will Perform Grooming: with supervision;Standing at sink ADL Goal: Grooming - Progress: Goal set today Pt Will Transfer to Toilet: with supervision;3-in-1 ADL Goal: Toilet Transfer - Progress: Goal set today        Prior Functioning  Home  Living Lives With: Alone Available Help at Discharge: Skilled Nursing Facility Type of Home: Independent living facility Home Access: Level entry Home Layout: One level Home Adaptive Equipment: Walker - rolling;Wheelchair - powered Additional Comments: Pt reports duaghter just got pt a scooter.  She had it one day before coming to hospital. Prior Function Level of Independence: Independent with assistive device(s) Vocation: Retired Comments: Pt had been at [facility] for rehab, then ALF getting rehab, then returned to her I living apartment.  She was there 1 day before admission to hospital. Communication Communication: No difficulties    Cognition  Overall Cognitive Status: Appears within functional limits for tasks assessed/performed Arousal/Alertness: Awake/alert Orientation Level: Appears intact for tasks assessed Behavior During Session: [facility] for tasks performed    Extremity/Trunk Assessment Right Upper Extremity Assessment RUE ROM/Strength/Tone: Within functional levels Left Upper Extremity Assessment LUE ROM/Strength/Tone: WFL for tasks assessed Right Lower Extremity Assessment RLE ROM/Strength/Tone: WFL for tasks assessed RLE ROM/Strength/Tone Deficits: 4-/5 strength Left Lower Extremity Assessment LLE ROM/Strength/Tone: Deficits LLE ROM/Strength/Tone Deficits: 3+/5 strength   Mobility Bed Mobility Bed Mobility: Supine to Sit Supine to Sit: 4: Min guard;With rails;HOB elevated Transfers Sit to Stand: 4: Min assist Stand to Sit: 4: Min assist Details for Transfer Assistance: cues for hand placement and for scooting to EOB.         End of Session OT - End of Session Activity Tolerance: Patient limited by pain Patient left: in chair;with family/visitor present   Amy Gothard, [place] 01/10/2012, 1:46 PM

## 2012-01-10 NOTE — Progress Notes (Addendum)
Subjective: "I'm feeling better this morning". Up in chair just finished with PT. Mild increased work of breathing. Reports pain rt ankle more tolerable.  No events during night   Objective: Vital signs Filed Vitals:   01/09/12 0508 01/09/12 1500 01/09/12 2114 01/10/12 0516  BP: 142/70 102/59 147/73 161/77  Pulse: 86 76 85 74  Temp: 98.7 F (37.1 C) 98.7 F (37.1 C) 98.9 F (37.2 C) 98.2 F (36.8 C)  TempSrc: Oral Oral Oral Oral  Resp: 18 18 18 18   Height:      Weight:    63.413 kg (139 lb 12.8 oz)  SpO2: 93% 93% 93% 93%   Weight change: -0.091 kg (-3.2 oz) Last BM Date: 01/09/12  Intake/Output from previous day: 04/28 0701 - 04/29 0700 In: 210 [P.O.:60; IV Piggyback:150] Out: 2050 [Urine:2050]     Physical Exam: General: Alert, awake, oriented x3, in no acute distress. HEENT: No bruits, no goiter. Mucus membranes mouth moist/pink.  Heart: Regular rate and rhythm, without murmurs, rubs, gallops. Lungs: Mild increased work of breathing with conversation. Breath sounds with fine crackles bases otherwise clear to auscultation bilaterally. Good air flow Abdomen: Soft, nontender, nondistended, positive bowel sounds. Extremities: Right ankle with lateral malleolus dressing dry/intact. Tender to touch and slight warmth. Bilateral swelling LEE.  Neuro: Grossly intact, nonfocal. Speech clear. Facial symmetry Skin: psoriatic changes. LE particularly dry, scaly with erythema    Lab Results: Basic Metabolic Panel:  Basename 01/10/12 0444 01/09/12 0242  NA 140 138  K 4.0 3.8  CL 103 101  CO2 30 29  GLUCOSE 92 94  BUN 19 22  CREATININE 0.91 0.94  CALCIUM 8.8 8.8  MG -- --  PHOS -- --   Liver Function Tests:  Basename 01/09/12 0242 01/08/12 1525  AST 15 19  ALT 21 25  ALKPHOS 46 52  BILITOT 0.2* 0.3  PROT 5.7* 6.4  ALBUMIN 3.1* 3.6   No results found for this basename: LIPASE:2,AMYLASE:2 in the last 72 hours No results found for this basename: AMMONIA:2 in the  last 72 hours CBC:  Basename 01/10/12 0444 01/09/12 0242 01/08/12 1525  WBC 15.4* 16.5* --  NEUTROABS -- -- 7.7  HGB 10.5* 10.4* --  HCT 32.5* 31.1* --  MCV 95.9 95.4 --  PLT 261 264 --   Cardiac Enzymes:  Basename 01/09/12 1029 01/09/12 0242 01/08/12 1950  CKTOTAL 50 59 81  CKMB 2.3 2.5 3.0  CKMBINDEX -- -- --  TROPONINI <0.30 <0.30 <0.30   BNP:  Basename 01/08/12 1525  PROBNP 2880.0*   D-Dimer: No results found for this basename: DDIMER:2 in the last 72 hours CBG: No results found for this basename: GLUCAP:6 in the last 72 hours Hemoglobin A1C: No results found for this basename: HGBA1C in the last 72 hours Fasting Lipid Panel: No results found for this basename: CHOL,HDL,LDLCALC,TRIG,CHOLHDL,LDLDIRECT in the last 72 hours Thyroid Function Tests: No results found for this basename: TSH,T4TOTAL,FREET4,T3FREE,THYROIDAB in the last 72 hours Anemia Panel: No results found for this basename: VITAMINB12,FOLATE,FERRITIN,TIBC,IRON,RETICCTPCT in the last 72 hours Coagulation: No results found for this basename: LABPROT:2,INR:2 in the last 72 hours Urine Drug Screen: Drugs of Abuse  No results found for this basename: labopia, cocainscrnur, labbenz, amphetmu, thcu, labbarb    Alcohol Level: No results found for this basename: ETH:2 in the last 72 hours Urinalysis:  Basename 01/08/12 1605  COLORURINE YELLOW  LABSPEC 1.013  PHURINE 7.0  GLUCOSEU NEGATIVE  HGBUR NEGATIVE  BILIRUBINUR NEGATIVE  KETONESUR NEGATIVE  PROTEINUR NEGATIVE  UROBILINOGEN 0.2  NITRITE NEGATIVE  LEUKOCYTESUR NEGATIVE   Misc. Labs:  Recent Results (from the past 240 hour(s))  URINE CULTURE     Status: Normal (Preliminary result)   Collection Time   01/08/12  4:05 PM      Component Value Range Status Comment   Specimen Description URINE, CLEAN CATCH   Final    Special Requests NONE   Final    Culture  Setup Time [id number]   Final    Colony Count 30,000 COLONIES/ML   Final    Culture  GRAM NEGATIVE RODS   Final    Report Status PENDING   Incomplete   WOUND CULTURE     Status: Normal (Preliminary result)   Collection Time   01/08/12  5:00 PM      Component Value Range Status Comment   Specimen Description LEG RIGHT CALF   Final    Special Requests Immunocompromised   Final    Gram Stain PENDING   Incomplete    Culture     Final    Value: ABUNDANT STAPHYLOCOCCUS AUREUS     Note: RIFAMPIN AND GENTAMICIN SHOULD NOT BE USED AS SINGLE DRUGS FOR TREATMENT OF STAPH INFECTIONS.   Report Status PENDING   Incomplete   CULTURE, BLOOD (ROUTINE X 2)     Status: Normal (Preliminary result)   Collection Time   01/08/12  7:35 PM      Component Value Range Status Comment   Specimen Description BLOOD LEFT HAND   Final    Special Requests BOTTLES DRAWN AEROBIC AND ANAEROBIC 5CC EACH   Final    Culture  Setup Time [id number]   Final    Culture     Final    Value:        BLOOD CULTURE RECEIVED NO GROWTH TO DATE CULTURE WILL BE HELD FOR 5 DAYS BEFORE ISSUING A FINAL NEGATIVE REPORT   Report Status PENDING   Incomplete   CULTURE, BLOOD (ROUTINE X 2)     Status: Normal (Preliminary result)   Collection Time   01/08/12  7:50 PM      Component Value Range Status Comment   Specimen Description BLOOD LEFT ARM   Final    Special Requests BOTTLES DRAWN AEROBIC AND ANAEROBIC [facility] EACH   Final    Culture  Setup Time [id number]   Final    Culture     Final    Value:        BLOOD CULTURE RECEIVED NO GROWTH TO DATE CULTURE WILL BE HELD FOR 5 DAYS BEFORE ISSUING A FINAL NEGATIVE REPORT   Report Status PENDING   Incomplete   MRSA PCR SCREENING     Status: Abnormal   Collection Time   01/08/12  8:52 PM      Component Value Range Status Comment   MRSA by PCR POSITIVE (*) NEGATIVE  Final     Studies/Results: Dg Chest 2 View  01/08/2012  *RADIOLOGY REPORT*  Clinical Data: Shortness of breath.  CHEST - 2 VIEW  Comparison: 12/07/2011.  Findings: Borderline enlarged cardiac silhouette with an  interval decrease in size.  Interval small bilateral pleural effusions and small amount of linear density at both lung bases.  Diffuse osteopenia.  Stable thoracolumbar vertebral compression fracture and vertebroplasty material.  IMPRESSION: Interval small bilateral pleural effusions and mild bibasilar atelectasis.  Original Report Authenticated By: Darrol Angel, M.D.   Mr Ankle Right  Wo Contrast  01/09/2012  *RADIOLOGY REPORT*  Clinical Data: 78 year old  with ankle pain, severe psoriasis and open wound over the lateral malleolus.  MRI OF THE RIGHT ANKLE WITHOUT CONTRAST  Technique:  Multiplanar, multisequence MR imaging was performed. No intravenous contrast was administered.  Comparison: Radiographs 12/07/2011.  Findings: 1 cm shallow skin ulcer is seen over the lateral malleolus. There is no underlying focal fluid collection.  Mild subcutaneous edema within the lower leg and foot is minimally asymmetric laterally.  The distal fibula appears normal without evidence of osteomyelitis. There is symmetric joint space loss throughout the ankle and midfoot.  There is a prominent intraosseous ganglion within the navicular.  There are osteophytes and subchondral cysts at the Lisfranc joint.  There is no evidence of acute fracture, dislocation or bone destruction.  The ankle tendons are all intact.  There is mild peroneal tendinosis.  The plantar fascia appears normal.  There is mild edema within the plantar foot musculature.  The anterior talofibular ligament is thickened, probably related to old injury.  The additional lateral ankle, deltoid and visualized components of the spring ligament appear intact.  IMPRESSION:  1.  Shallow skin ulcer overlying the lateral malleolus.  No evidence of underlying abscess. 2.  No evidence of osteomyelitis. 3.  Mild nonspecific subcutaneous edema may represent cellulitis. 4.  Scattered arthropathic changes, not advanced for age.  No acute osteochondral findings. 5.  No acute or  significant tendon or ligament findings.  Original Report Authenticated By: Gerrianne Scale, M.D.    Medications: Scheduled Meds:   . aspirin EC  162 mg Oral Daily  . atorvastatin  20 mg Oral QODAY  . aztreonam  2 g Intravenous Q8H  . carvedilol  12.5 mg Oral BID WC  . Chlorhexidine Gluconate Cloth  6 each Topical Q0600  . clobetasol cream   Topical BID  . clopidogrel  75 mg Oral QAC breakfast  . diltiazem  120 mg Oral Daily  . feeding supplement  237 mL Oral TID BM  . folic acid  1 mg Oral Daily  . furosemide  40 mg Intravenous Q12H  . heparin  5,000 Units Subcutaneous Q8H  . iron polysaccharides  150 mg Oral Q breakfast  . isosorbide-hydrALAZINE  0.5 tablet Oral BID  . levofloxacin (LEVAQUIN) IV  750 mg Intravenous Q48H  . metoprolol  5 mg Intravenous Once  . mupirocin ointment  1 application Nasal BID  . pantoprazole  40 mg Oral Q1200  . potassium chloride SA  20 mEq Oral Daily  . sodium chloride  3 mL Intravenous Q12H  . vancomycin  1,000 mg Intravenous Q24H  . DISCONTD: levalbuterol  0.63 mg Nebulization Q6H   Continuous Infusions:  PRN Meds:.acetaminophen, levalbuterol, menthol-cetylpyridinium, ondansetron (ZOFRAN) IV, oxyCODONE, polyvinyl alcohol, DISCONTD: oxyCODONE  Assessment/Plan:  Principal Problem:  *Cellulitis Active Problems:  Leukocytosis  Hypertension  Atrial fibrillation  Psoriasis  Chronic diastolic HF (heart failure)  HCAP (healthcare-associated pneumonia)    #1 cellulitis of the right ankle. This is superimposed on the psoriasis. MRI yields no evidence osteomyelitis or abcess.  Continue vancomycin for now. Venous dopplers prelim report yields no DVT or SVT in bilateral LE. Low albumin may be contributing as well. Pt eating better this am. Pain remains factor. Continue pain med. Vanc day #2, Azactam day #3. Appreciate wound consult. ABI suggest moderate disease in right LE. No need for vascular input at this time. Pain is localized in ankle. Pain  probably from cellulitis.  #2 healthcare associated pneumonia: Seems some better. Continue broad-spectrum antibiotics. Blood cultures no growth  to date. 3 days levaquin complete. Continue oxygen and nebs. Swallow eval unremarkable.    #3 "Fullness" in the chest: Improved.  It is probably related to the pneumonia and possible exacerbation of his CHF. EKG is nonischemic. Troponins are negative.   #4 history of diastolic heart failure with likely acute exacerbation: The pleural effusions could suggest an acute exacerbation of the same. Volume status neg 2.8L.  Intravenous Lasix x 2 doses. Will give 2 more doses of IV lasix. Strict I&O's.      #5 leukocytosis: This is chronic. Trending downward slightly. Antibiotics as above. Afebrile, non-toxic appearing.  She was supposed to see a hematologist however, because of the stroke, that was put off. Continue to monitor.   #6 history of atrial fibrillation: Continue with her current medications. She's currently in sinus rhythm. She's not on anticoagulation. On 4/28 she went into afib with mild RVR for about 2 hours and then converted back to NSR.  #7 history of hypertension: SBP range 147-161. Continue Bidil. Lisinopril on hold.   Continue to monitor.   #8 Psoriasis: Appears to have worsened over the last month or so. She is supposed to see a dermatologist at [facility] on Monday, but will have to reschedule that appointment.   #9 Malnutrition: Start Ensure. Appetite improving.   #10. Dispo: from independent living. PT/OT eval. Will likely need SNF before returning to same.      LOS: 2 days   [facility] M 01/10/2012, 9:33 AM  Patient seen anda examined. Agree with above note with changes. Patient frustrated by her ankle pain.  Discussed in detail with patient and daughter. I feel pain could be from arthritis or the chronic ulcer and it also seems to have certain elements of neuropathic pain. I told them that with improvement in cellulitis the pain  may get better. Offered to initiate Lyrica for possible neuropathic pain and she would like to think about it. Uric acid during previous hospitalization was normal. Will continue to monitor. Check CXR.  Joanann Mies 1:23 PM

## 2012-01-10 NOTE — Evaluation (Signed)
Physical Therapy Evaluation Patient Details Name: Sara [name] MRN: [id number] DOB: [birth year] Today's Date: 01/10/2012 Time: 4098-1191 PT Time Calculation (min): 22 min  PT Assessment / Plan / Recommendation Clinical Impression  78 y/o WF with decreased I with functional mobility.  Recommend SNF to work towards increasing level of independence before returning to her apartment.  Pt was in agreement.    PT Assessment  Patient needs continued PT services    Follow Up Recommendations  Skilled nursing facility    Equipment Recommendations  Defer to next venue    Frequency Min 3X/week    Precautions / Restrictions Precautions Precautions: Fall   Pertinent Vitals/Pain 5/10 pain in R ankle with WB due to wound.  Improved as gait progressed.  Nursing notified.      Mobility  Bed Mobility Bed Mobility: Supine to Sit Supine to Sit: 4: Min guard;With rails;HOB elevated Transfers Transfers: Sit to Stand;Stand to Sit Sit to Stand: 4: Min assist Stand to Sit: 4: Min assist Details for Transfer Assistance: cues for hand placement and for scooting to EOB. Ambulation/Gait Ambulation/Gait Assistance: 4: Min assist Ambulation Distance (Feet): 25 Feet Assistive device: Rolling walker Ambulation/Gait Assistance Details: Poor sequencing.  Needed constant cueing.  Pain with R LE WB due to wound on top and lateral side of ankle.  Pt reports pain decreased with ambulation.  Nursing notified. Gait Pattern: Decreased step length - right;Decreased step length - left         PT Goals Acute Rehab PT Goals PT Goal Formulation: With patient Time For Goal Achievement: 01/17/12 Potential to Achieve Goals: Good Pt will go Supine/Side to Sit: with modified independence PT Goal: Supine/Side to Sit - Progress: Goal set today Pt will go Sit to Stand: with modified independence PT Goal: Sit to Stand - Progress: Goal set today Pt will Ambulate: 51 - 150 feet;with supervision;with rolling walker PT  Goal: Ambulate - Progress: Goal set today Pt will Perform Home Exercise Program: with supervision, verbal cues required/provided PT Goal: Perform Home Exercise Program - Progress: Goal set today  Visit Information  Last PT Received On: 01/10/12    Subjective Data  Subjective: "I would love to get out of bed." Patient Stated Goal: To get stronger before returning to her apartment.   Prior Functioning  Home Living Lives With: Alone Available Help at Discharge: Skilled Nursing Facility Type of Home: Independent living facility Home Access: Level entry Home Layout: One level Home Adaptive Equipment: Walker - rolling;Wheelchair - powered Additional Comments: Pt reports duaghter just got pt a scooter.  She had it one day before coming to hospital. Prior Function Level of Independence: Independent with assistive device(s) Vocation: Retired Comments: Pt had been at [facility] for rehab, then ALF getting rehab, then returned to her I living apartment.  She was there 1 day before admission to hospital. Communication Communication: No difficulties    Cognition  Overall Cognitive Status: Appears within functional limits for tasks assessed/performed Arousal/Alertness: Awake/alert Orientation Level: Appears intact for tasks assessed Behavior During Session: [facility] for tasks performed    Extremity/Trunk Assessment Right Lower Extremity Assessment RLE ROM/Strength/Tone: Deficits RLE ROM/Strength/Tone Deficits: 4-/5 strength Left Lower Extremity Assessment LLE ROM/Strength/Tone: Deficits LLE ROM/Strength/Tone Deficits: 3+/5 strength   Balance    End of Session PT - End of Session Equipment Utilized During Treatment: Gait belt Activity Tolerance: Patient tolerated treatment well Patient left: in chair;with call bell/phone within reach;with nursing in room Nurse Communication: Other (comment);Mobility status (pain with WB)   Yiannis Tulloch  LUBECK 01/10/2012, 10:13 AM

## 2012-01-10 NOTE — Progress Notes (Signed)
VASCULAR LAB PRELIMINARY  ARTERIAL  ABI completed:    RIGHT    LEFT    PRESSURE WAVEFORM  PRESSURE WAVEFORM  BRACHIAL 190 Tri BRACHIAL 176 Tri  DP   DP    AT 130 Severe damp mono AT 151 mono  PT 103 Severe damp mono PT 122 mono  PER   PER    GREAT TOE  NA GREAT TOE  NA    RIGHT LEFT  ABI 0.68, moderate disease 0.79, mild disease     Farrel Demark, RDMS 01/10/2012, 11:15 AM

## 2012-01-10 NOTE — Evaluation (Signed)
Clinical/Bedside Swallow Evaluation Patient Details  Name: Sara [name] MRN: [id number] Date of Birth: 10-01-1918  Today's Date: 01/10/2012 Time: 1100-1145 SLP Time Calculation (min): 45 min  Past Medical History:  Past Medical History  Diagnosis Date  . Hypertension   . Atrial fibrillation   . Psoriasis   . Congestive heart failure (CHF)   . Basal cell carcinoma   . History of CVA (cerebrovascular accident)   . Coronary artery disease   . Myocardial infarction   . CHF (congestive heart failure)   . Stroke   . Shortness of breath   . H/O hiatal hernia    Past Surgical History:  Past Surgical History  Procedure Date  . Tonsillectomy   . Dilation and curettage of uterus   . Eye surgery   . Cataracts    HPI:  78 y.o. female admitted with right ankle cellulitis and pna. PMH + recent CVA, March 2013. Pt received rehab at [facility] in the health care unit, then was d/c'd back to independently living prior to this admit. Pt and her daughter deny any overt s/s of coughing or choking with meals. 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 CXR reveals borderline enlarged cardiac silhouette, small bilateral pleural effusions and mild bibasilar atelectasis. Breath sounds per NP notes  reveal fine crackles at bases. Temp is 98.2. PMH also + for psoriasis and hiatal hernia.   Assessment / Plan / Recommendation Clinical Impression  No overt s/s of aspiration at bedside. Swallow is timely, voice is clear after all PO. Pt tolerated successive straw sips and meds with straw sips of water without overt difficulty. Pt did  request water sips with cracker due to dry mouth. Provided pt with oral moisturizure packets and dry mouth  mouth wash. Educated pt and daughter on how to use these products. Educated pt on importance of oral care with dry mouth. Rec. pt continue current diet, choosing foods that are moist, such as soups, items with gravy  and sauces etc. Pt  and daughter verbalized understanding of all topics.    Aspiration Risk  Mild    Diet Recommendation Regular;Thin liquid   Other  Recommendations     Follow Up Recommendations  None    Frequency and Duration min 2x/week  1 week   Pertinent Vitals/Pain Right ankle pain, 4/10, nsg aware, leg elevated. Temp 98.2    SLP Swallow Goals Patient will consume recommended diet without observed clinical signs of aspiration with: Supervision/safety Patient will utilize recommended strategies during swallow to increase swallowing safety with: Supervision/safety Goal #3: Pt will demonstrate or verbalize compensatory strategies and oral care for xerostomia with minimal assistance   Swallow Study Prior Functional Status  Type of Home: Independent living facility Lives With: Alone Vocation: Retired    General Date of Onset: 01/09/12 Previous Swallow Assessment: n/a Diet Prior to this Study: Regular;Thin liquids Temperature Spikes Noted: No Respiratory Status: Room air Behavior/Cognition: Alert;Cooperative Oral Cavity - Dentition: Adequate natural dentition Vision: Functional for self-feeding Patient Positioning: Upright in bed Baseline Vocal Quality: Clear Volitional Cough: Strong Volitional Swallow: Able to elicit    Oral/Motor/Sensory Function Overall Oral Motor/Sensory Function: Appears within functional limits for tasks assessed                             Sara [name] 01/10/2012,12:16 PM

## 2012-01-11 LAB — BASIC METABOLIC PANEL
Calcium: 8.6 mg/dL (ref 8.4–10.5)
Chloride: 102 mEq/L (ref 96–112)
Creatinine, Ser: 0.92 mg/dL (ref 0.50–1.10)
Potassium: 3.9 mEq/L (ref 3.5–5.1)
Sodium: 136 mEq/L (ref 135–145)

## 2012-01-11 LAB — WOUND CULTURE: Gram Stain: NONE SEEN

## 2012-01-11 LAB — URINE CULTURE: Culture  Setup Time: 201304272030

## 2012-01-11 LAB — CBC
MCV: 95.5 fL (ref 78.0–100.0)
Platelets: 263 10*3/uL (ref 150–400)
RDW: 16.1 % — ABNORMAL HIGH (ref 11.5–15.5)
WBC: 16.9 10*3/uL — ABNORMAL HIGH (ref 4.0–10.5)

## 2012-01-11 MED ORDER — FUROSEMIDE 10 MG/ML IJ SOLN
40.0000 mg | Freq: Two times a day (BID) | INTRAMUSCULAR | Status: DC
  Administered 2012-01-11 – 2012-01-12 (×2): 40 mg via INTRAVENOUS
  Filled 2012-01-11 (×4): qty 4

## 2012-01-11 MED ORDER — FUROSEMIDE 10 MG/ML IJ SOLN
40.0000 mg | Freq: Two times a day (BID) | INTRAMUSCULAR | Status: DC
  Filled 2012-01-11 (×2): qty 4

## 2012-01-11 MED ORDER — LEVOFLOXACIN 750 MG PO TABS
750.0000 mg | ORAL_TABLET | ORAL | Status: DC
  Administered 2012-01-12: 750 mg via ORAL
  Filled 2012-01-11 (×2): qty 1

## 2012-01-11 NOTE — Progress Notes (Signed)
Occupational Therapy Treatment Patient Details Name: Sara [name] MRN: [id number] DOB: 09/09/1919 Today's Date: 01/11/2012 Time: 1006-1020 OT Time Calculation (min): 14 min  OT Assessment / Plan / Recommendation Comments on Treatment Session much improved, but still need  STSNF    Follow Up Recommendations  Skilled nursing facility    Equipment Recommendations  Defer to next venue    Frequency Min 2X/week   Plan Discharge plan remains appropriate    Precautions / Restrictions Precautions Precautions: Fall   Pertinent Vitals/Pain No pain    ADL  Grooming: Performed;Brushing hair;Teeth care;Wash/dry face;Wash/dry hands Where Assessed - Grooming: Standing at sink;Other (comment) (pt stood for 10 minutes- which is MUCH improved !) Ambulation Related to ADLs: Pt able to bear weight on R foot today without any pain reported.    Pt able to take steps with min A (hand held by OT)  Pt agrees she needs SNF [facility]    OT Goals ADL Goals ADL Goal: Grooming - Progress: Progressing toward goals           Cognition  Overall Cognitive Status: Appears within functional limits for tasks assessed/performed Arousal/Alertness: Awake/alert Orientation Level: Appears intact for tasks assessed Behavior During Session: [facility] for tasks performed    Mobility Transfers Transfers: Sit to Stand;Stand to Sit Sit to Stand: 4: Min guard Stand to Sit: 4: Min guard         End of Session OT - End of Session Activity Tolerance: Patient tolerated treatment well Patient left: in bed   Damar Petit D 01/11/2012, 11:54 AM

## 2012-01-11 NOTE — Progress Notes (Addendum)
ANTIBIOTIC CONSULT NOTE - Follow Up  Pharmacy Consult for vancomycin, aztreonam, levofloxacin Indication: HCAP, cellulitis  Allergies  Allergen Reactions  . Penicillins Other (See Comments)    unknown  . Triamterene Other (See Comments)    unknown  . Alendronate Sodium Rash    Patient Measurements: Height: 5\' 5"  (165.1 cm) Weight: 139 lb 12.8 oz (63.413 kg) IBW/kg (Calculated) : 57   Vital Signs: Temp: 98 F (36.7 C) (04/30 0500) Temp src: Oral (04/30 0500) BP: 152/77 mmHg (04/30 0510) Pulse Rate: 68  (04/30 0500) Intake/Output from previous day: 04/29 0701 - 04/30 0700 In: 784 [P.O.:480; IV Piggyback:304] Out: 1525 [Urine:1525] Intake/Output from this shift: Total I/O In: 240 [P.O.:240] Out: 300 [Urine:300]  Labs:  [facility] 01/11/12 0500 01/10/12 0444 01/09/12 0242  WBC 16.9* 15.4* 16.5*  HGB 11.2* 10.5* 10.4*  PLT 263 261 264  LABCREA -- -- --  CREATININE 0.92 0.91 0.94   Estimated Creatinine Clearance: 36.6 ml/min (by C-G formula based on Cr of 0.92). No results found for this basename: VANCOTROUGH:2,VANCOPEAK:2,VANCORANDOM:2,GENTTROUGH:2,GENTPEAK:2,GENTRANDOM:2,TOBRATROUGH:2,TOBRAPEAK:2,TOBRARND:2,AMIKACINPEAK:2,AMIKACINTROU:2,AMIKACIN:2, in the last 72 hours   Microbiology: Recent Results (from the past 720 hour(s))  URINE CULTURE     Status: Normal   Collection Time   01/08/12  4:05 PM      Component Value Range Status Comment   Specimen Description URINE, CLEAN CATCH   Final    Special Requests NONE   Final    Culture  Setup Time [id number]   Final    Colony Count 30,000 COLONIES/ML   Final    Culture PROTEUS MIRABILIS   Final    Report Status 01/11/2012 FINAL   Final    Organism ID, Bacteria PROTEUS MIRABILIS   Final   WOUND CULTURE     Status: Normal   Collection Time   01/08/12  5:00 PM      Component Value Range Status Comment   Specimen Description LEG RIGHT CALF   Final    Special Requests Immunocompromised   Final    Gram Stain     Final     Value: NO WBC SEEN     NO SQUAMOUS EPITHELIAL CELLS SEEN     FEW GRAM POSITIVE COCCI     IN PAIRS   Culture     Final    Value: ABUNDANT METHICILLIN RESISTANT STAPHYLOCOCCUS AUREUS     Note: RIFAMPIN AND GENTAMICIN SHOULD NOT BE USED AS SINGLE DRUGS FOR TREATMENT OF STAPH INFECTIONS. CRITICAL RESULT CALLED TO, READ BACK BY AND VERIFIED WITH: TERA D 01/11/12 AT 815 AM BY [facility]   Report Status 01/11/2012 FINAL   Final    Organism ID, Bacteria METHICILLIN RESISTANT STAPHYLOCOCCUS AUREUS   Final   CULTURE, BLOOD (ROUTINE X 2)     Status: Normal (Preliminary result)   Collection Time   01/08/12  7:35 PM      Component Value Range Status Comment   Specimen Description BLOOD LEFT HAND   Final    Special Requests BOTTLES DRAWN AEROBIC AND ANAEROBIC 5CC EACH   Final    Culture  Setup Time [id number]   Final    Culture     Final    Value:        BLOOD CULTURE RECEIVED NO GROWTH TO DATE CULTURE WILL BE HELD FOR 5 DAYS BEFORE ISSUING A FINAL NEGATIVE REPORT   Report Status PENDING   Incomplete   CULTURE, BLOOD (ROUTINE X 2)     Status: Normal (Preliminary result)   Collection Time  01/08/12  7:50 PM      Component Value Range Status Comment   Specimen Description BLOOD LEFT ARM   Final    Special Requests BOTTLES DRAWN AEROBIC AND ANAEROBIC [facility] EACH   Final    Culture  Setup Time [id number]   Final    Culture     Final    Value:        BLOOD CULTURE RECEIVED NO GROWTH TO DATE CULTURE WILL BE HELD FOR 5 DAYS BEFORE ISSUING A FINAL NEGATIVE REPORT   Report Status PENDING   Incomplete   MRSA PCR SCREENING     Status: Abnormal   Collection Time   01/08/12  8:52 PM      Component Value Range Status Comment   MRSA by PCR POSITIVE (*) NEGATIVE  Final     Medical History: Past Medical History  Diagnosis Date  . Hypertension   . Atrial fibrillation   . Psoriasis   . Congestive heart failure (CHF)   . Basal cell carcinoma   . History of CVA (cerebrovascular accident)   . Coronary artery  disease   . Myocardial infarction   . CHF (congestive heart failure)   . Stroke   . Shortness of breath   . H/O hiatal hernia     Medications:  Scheduled:     . aspirin EC  162 mg Oral Daily  . atorvastatin  20 mg Oral QODAY  . aztreonam  2 g Intravenous Q8H  . carvedilol  12.5 mg Oral BID WC  . Chlorhexidine Gluconate Cloth  6 each Topical Q0600  . clobetasol cream   Topical BID  . clopidogrel  75 mg Oral QAC breakfast  . diltiazem  120 mg Oral Daily  . feeding supplement  237 mL Oral TID BM  . folic acid  1 mg Oral Daily  . furosemide  40 mg Intravenous Q12H  . heparin  5,000 Units Subcutaneous Q8H  . iron polysaccharides  150 mg Oral Q breakfast  . isosorbide-hydrALAZINE  0.5 tablet Oral BID  . levofloxacin (LEVAQUIN) IV  750 mg Intravenous Q48H  . mupirocin ointment  1 application Nasal BID  . pantoprazole  40 mg Oral Q1200  . potassium chloride SA  20 mEq Oral Daily  . sodium chloride  3 mL Intravenous Q12H  . vancomycin  1,000 mg Intravenous Q24H  . DISCONTD: furosemide  40 mg Oral Daily   Infusions:   Assessment:  Day #4 Vancomycin/Aztrenam/Levaquin for MRSA ankle cellulitis/HCAP/proteus UTI with only 30k colonies  Increase in WBC  Stable renal function  Afebrile  Proteus urine cx only resistant to nitrofurantoin  Note that wound cx came back as growing MRSA - worth noting that patient with PCN allergy  Goal of Therapy:  Vancomycin trough level 15-20 mcg/ml, Aztreonam and Levaquin adjustments per renal function  Plan:  1. Continue vancomycin 1g q24. Will check a vanc trough tonight prior to next dose 2. Continue Aztreonam 2g IV q8 for CrCl > 30 ml/min 3. Continue Levaquin 750mg  IV q48 for CrCl < 50 - will change to PO Levaquin today   Hessie Knows, PharmD, BCPS Pager [phone] 01/11/2012 11:56 AM

## 2012-01-11 NOTE — Progress Notes (Signed)
Subjective: Up in chair, smiling. Reports improved pain rt ankle. Breathing is better. No CP.  Objective: Vital signs Filed Vitals:   01/10/12 1500 01/10/12 2137 01/11/12 0500 01/11/12 0510  BP: 129/77 153/79 176/52 152/77  Pulse: 68 63 68   Temp: 98.3 F (36.8 C) 98 F (36.7 C) 98 F (36.7 C)   TempSrc:  Oral Oral   Resp: 15 16 16    Height:      Weight:      SpO2: 94% 94% 92%    Weight change:  Last BM Date: 01/10/12  Intake/Output from previous day: 04/29 0701 - 04/30 0700 In: 784 [P.O.:480; IV Piggyback:304] Out: 1525 [Urine:1525] Total I/O In: 240 [P.O.:240] Out: 300 [Urine:300]   Physical Exam: General: Alert, awake, oriented x3, in no acute distress. HEENT: No bruits, no goiter.PERRL Heart: Regular rate and rhythm, without murmurs, rubs, gallops. Lungs:Normal effort. Breath sounds clear to auscultation bilaterally. Abdomen: Soft, nontender, nondistended, positive bowel sounds. Extremities: Rt ankle with lateral malleolus dressing dry/intact. Remains tender to light touch. Bilateral LEE. Another shallow wound noted in right leg in calf area. Neuro: Grossly intact, nonfocal. Speech clear, facial symmetry Skin: psoriatic changes. Less scaly, continued erythema.    Lab Results: Basic Metabolic Panel:  Basename 01/11/12 0500 01/10/12 0444  NA 136 140  K 3.9 4.0  CL 102 103  CO2 29 30  GLUCOSE 91 92  BUN 20 19  CREATININE 0.92 0.91  CALCIUM 8.6 8.8  MG -- --  PHOS -- --   Liver Function Tests:  Basename 01/09/12 0242 01/08/12 1525  AST 15 19  ALT 21 25  ALKPHOS 46 52  BILITOT 0.2* 0.3  PROT 5.7* 6.4  ALBUMIN 3.1* 3.6   No results found for this basename: LIPASE:2,AMYLASE:2 in the last 72 hours No results found for this basename: AMMONIA:2 in the last 72 hours CBC:  Basename 01/11/12 0500 01/10/12 0444 01/08/12 1525  WBC 16.9* 15.4* --  NEUTROABS -- -- 7.7  HGB 11.2* 10.5* --  HCT 34.3* 32.5* --  MCV 95.5 95.9 --  PLT 263 261 --   Cardiac  Enzymes:  Basename 01/09/12 1029 01/09/12 0242 01/08/12 1950  CKTOTAL 50 59 81  CKMB 2.3 2.5 3.0  CKMBINDEX -- -- --  TROPONINI <0.30 <0.30 <0.30   BNP:  Basename 01/08/12 1525  PROBNP 2880.0*   D-Dimer: No results found for this basename: DDIMER:2 in the last 72 hours CBG: No results found for this basename: GLUCAP:6 in the last 72 hours Hemoglobin A1C: No results found for this basename: HGBA1C in the last 72 hours Fasting Lipid Panel: No results found for this basename: CHOL,HDL,LDLCALC,TRIG,CHOLHDL,LDLDIRECT in the last 72 hours Thyroid Function Tests: No results found for this basename: TSH,T4TOTAL,FREET4,T3FREE,THYROIDAB in the last 72 hours Anemia Panel: No results found for this basename: VITAMINB12,FOLATE,FERRITIN,TIBC,IRON,RETICCTPCT in the last 72 hours Coagulation: No results found for this basename: LABPROT:2,INR:2 in the last 72 hours Urine Drug Screen: Drugs of Abuse  No results found for this basename: labopia, cocainscrnur, labbenz, amphetmu, thcu, labbarb    Alcohol Level: No results found for this basename: ETH:2 in the last 72 hours Urinalysis:  Basename 01/08/12 1605  COLORURINE YELLOW  LABSPEC 1.013  PHURINE 7.0  GLUCOSEU NEGATIVE  HGBUR NEGATIVE  BILIRUBINUR NEGATIVE  KETONESUR NEGATIVE  PROTEINUR NEGATIVE  UROBILINOGEN 0.2  NITRITE NEGATIVE  LEUKOCYTESUR NEGATIVE   Misc. Labs:  Recent Results (from the past 240 hour(s))  URINE CULTURE     Status: Normal   Collection Time  01/08/12  4:05 PM      Component Value Range Status Comment   Specimen Description URINE, CLEAN CATCH   Final    Special Requests NONE   Final    Culture  Setup Time [id number]   Final    Colony Count 30,000 COLONIES/ML   Final    Culture PROTEUS MIRABILIS   Final    Report Status 01/11/2012 FINAL   Final    Organism ID, Bacteria PROTEUS MIRABILIS   Final   WOUND CULTURE     Status: Normal   Collection Time   01/08/12  5:00 PM      Component Value Range Status  Comment   Specimen Description LEG RIGHT CALF   Final    Special Requests Immunocompromised   Final    Gram Stain     Final    Value: NO WBC SEEN     NO SQUAMOUS EPITHELIAL CELLS SEEN     FEW GRAM POSITIVE COCCI     IN PAIRS   Culture     Final    Value: ABUNDANT METHICILLIN RESISTANT STAPHYLOCOCCUS AUREUS     Note: RIFAMPIN AND GENTAMICIN SHOULD NOT BE USED AS SINGLE DRUGS FOR TREATMENT OF STAPH INFECTIONS. CRITICAL RESULT CALLED TO, READ BACK BY AND VERIFIED WITH: TERA D 01/11/12 AT 815 AM BY [facility]   Report Status 01/11/2012 FINAL   Final    Organism ID, Bacteria METHICILLIN RESISTANT STAPHYLOCOCCUS AUREUS   Final   CULTURE, BLOOD (ROUTINE X 2)     Status: Normal (Preliminary result)   Collection Time   01/08/12  7:35 PM      Component Value Range Status Comment   Specimen Description BLOOD LEFT HAND   Final    Special Requests BOTTLES DRAWN AEROBIC AND ANAEROBIC 5CC EACH   Final    Culture  Setup Time [id number]   Final    Culture     Final    Value:        BLOOD CULTURE RECEIVED NO GROWTH TO DATE CULTURE WILL BE HELD FOR 5 DAYS BEFORE ISSUING A FINAL NEGATIVE REPORT   Report Status PENDING   Incomplete   CULTURE, BLOOD (ROUTINE X 2)     Status: Normal (Preliminary result)   Collection Time   01/08/12  7:50 PM      Component Value Range Status Comment   Specimen Description BLOOD LEFT ARM   Final    Special Requests BOTTLES DRAWN AEROBIC AND ANAEROBIC [facility] EACH   Final    Culture  Setup Time [id number]   Final    Culture     Final    Value:        BLOOD CULTURE RECEIVED NO GROWTH TO DATE CULTURE WILL BE HELD FOR 5 DAYS BEFORE ISSUING A FINAL NEGATIVE REPORT   Report Status PENDING   Incomplete   MRSA PCR SCREENING     Status: Abnormal   Collection Time   01/08/12  8:52 PM      Component Value Range Status Comment   MRSA by PCR POSITIVE (*) NEGATIVE  Final     Studies/Results: Mr Ankle Right  Wo Contrast  01/09/2012  *RADIOLOGY REPORT*  Clinical Data: 78 year old with  ankle pain, severe psoriasis and open wound over the lateral malleolus.  MRI OF THE RIGHT ANKLE WITHOUT CONTRAST  Technique:  Multiplanar, multisequence MR imaging was performed. No intravenous contrast was administered.  Comparison: Radiographs 12/07/2011.  Findings: 1 cm shallow skin ulcer is seen over the  lateral malleolus. There is no underlying focal fluid collection.  Mild subcutaneous edema within the lower leg and foot is minimally asymmetric laterally.  The distal fibula appears normal without evidence of osteomyelitis. There is symmetric joint space loss throughout the ankle and midfoot.  There is a prominent intraosseous ganglion within the navicular.  There are osteophytes and subchondral cysts at the Lisfranc joint.  There is no evidence of acute fracture, dislocation or bone destruction.  The ankle tendons are all intact.  There is mild peroneal tendinosis.  The plantar fascia appears normal.  There is mild edema within the plantar foot musculature.  The anterior talofibular ligament is thickened, probably related to old injury.  The additional lateral ankle, deltoid and visualized components of the spring ligament appear intact.  IMPRESSION:  1.  Shallow skin ulcer overlying the lateral malleolus.  No evidence of underlying abscess. 2.  No evidence of osteomyelitis. 3.  Mild nonspecific subcutaneous edema may represent cellulitis. 4.  Scattered arthropathic changes, not advanced for age.  No acute osteochondral findings. 5.  No acute or significant tendon or ligament findings.  Original Report Authenticated By: Gerrianne Scale, M.D.   Dg Chest Port 1 View  01/10/2012  *RADIOLOGY REPORT*  Clinical Data: Shortness of breath, weakness  PORTABLE CHEST - 1 VIEW  Comparison: 01/08/2012  Findings: Mild interstitial edema, increased, with bilateral pleural effusions, left greater than right.  Associated left basilar atelectasis.  The heart is mildly enlarged.  Degenerative changes of the visualized  thoracolumbar spine.  IMPRESSION: Cardiomegaly with mild interstitial edema, increased.  Small bilateral pleural effusions, left greater than right, with associated left basilar atelectasis.  Original Report Authenticated By: Charline Bills, M.D.    Medications: Scheduled Meds:   . aspirin EC  162 mg Oral Daily  . atorvastatin  20 mg Oral QODAY  . aztreonam  2 g Intravenous Q8H  . carvedilol  12.5 mg Oral BID WC  . Chlorhexidine Gluconate Cloth  6 each Topical Q0600  . clobetasol cream   Topical BID  . clopidogrel  75 mg Oral QAC breakfast  . diltiazem  120 mg Oral Daily  . feeding supplement  237 mL Oral TID BM  . folic acid  1 mg Oral Daily  . furosemide  40 mg Intravenous Q12H  . heparin  5,000 Units Subcutaneous Q8H  . iron polysaccharides  150 mg Oral Q breakfast  . isosorbide-hydrALAZINE  0.5 tablet Oral BID  . levofloxacin (LEVAQUIN) IV  750 mg Intravenous Q48H  . mupirocin ointment  1 application Nasal BID  . pantoprazole  40 mg Oral Q1200  . potassium chloride SA  20 mEq Oral Daily  . sodium chloride  3 mL Intravenous Q12H  . vancomycin  1,000 mg Intravenous Q24H  . DISCONTD: furosemide  40 mg Oral Daily   Continuous Infusions:  PRN Meds:.acetaminophen, levalbuterol, menthol-cetylpyridinium, ondansetron (ZOFRAN) IV, oxyCODONE, polyvinyl alcohol  Assessment/Plan:  Principal Problem:  *Cellulitis Active Problems:  Leukocytosis  Hypertension  Atrial fibrillation  Psoriasis  Chronic diastolic HF (heart failure)  HCAP (healthcare-associated pneumonia)   #1 cellulitis of the right ankle. This is superimposed on the psoriasis. MRI yields no evidence osteomyelitis or abcess. Continue vancomycin for now. Venous dopplers prelim report yields no DVT or SVT in bilateral LE. Low albumin may be contributing as well. Pain better controlled. Vanc day #3, Azactam day #4. Appreciate wound consult. ABI suggest moderate disease in right LE. No need for vascular input at this time.  Pain is  localized in ankle.    #2 healthcare associated pneumonia: Much improved. Continue broad-spectrum antibiotics. Blood cultures no growth to date. 4 days levaquin complete. Continue oxygen and nebs. Swallow eval unremarkable.   #3 "Fullness" in the chest: No further episodes. It is probably related to the pneumonia and possible exacerbation of his CHF. EKG is nonischemic. Troponins are negative.   #4 history of diastolic heart failure with likely acute exacerbation: The pleural effusions could suggest an acute exacerbation of the same. Volume status neg 3.5L. Intravenous Lasix x 4 doses.  Continue for another 2 doses. Will repeat CXR in AM. If improved then can consider transition to oral lasix. Strict I&O's.  #5 leukocytosis: This is chronic. Antibiotics as above. Afebrile, non-toxic appearing. She was supposed to see a hematologist however, because of the stroke, that was put off. Continue to monitor.   #6 history of atrial fibrillation: Continue with her current medications. She's currently in sinus rhythm. She's not on anticoagulation. On 4/28 she went into afib with mild RVR for about 2 hours and then converted back to NSR.   #7 history of hypertension: SBP range 129-176. Continue Bidil. Lisinopril on hold. Continue to monitor.   #8 Psoriasis: Appears to have worsened over the last month or so. She is supposed to see a dermatologist at [facility] on Monday, but will have to reschedule that appointment.   #9 Malnutrition: Start Ensure. Appetite improving.   #10. Dispo: from independent living. PT/OT eval. Will likely need SNF before returning to same. Hopefully in 1-2 days    LOS: 3 days   [facility] M 01/11/2012, 10:09 AM  Patient seen and examined. Agree with above note with changes. Patient feels better. Pain is improving. Breathing is better. Will involve SW to facilitate DC to SNF when ready. Can discontinue Aztreonam. Wound culture growing MRSA sensitive to Doxycycline which  can be used when transitioning from Vanc. Urine growing insignificant colonies of Proteus.  Genola Yuille 12:38 PM

## 2012-01-12 ENCOUNTER — Inpatient Hospital Stay (HOSPITAL_COMMUNITY): Payer: Medicare Other

## 2012-01-12 DIAGNOSIS — L02419 Cutaneous abscess of limb, unspecified: Secondary | ICD-10-CM

## 2012-01-12 DIAGNOSIS — L03119 Cellulitis of unspecified part of limb: Secondary | ICD-10-CM

## 2012-01-12 DIAGNOSIS — D696 Thrombocytopenia, unspecified: Secondary | ICD-10-CM

## 2012-01-12 DIAGNOSIS — I4891 Unspecified atrial fibrillation: Secondary | ICD-10-CM

## 2012-01-12 DIAGNOSIS — I5033 Acute on chronic diastolic (congestive) heart failure: Secondary | ICD-10-CM

## 2012-01-12 LAB — BASIC METABOLIC PANEL
CO2: 28 mEq/L (ref 19–32)
Calcium: 9.1 mg/dL (ref 8.4–10.5)
GFR calc Af Amer: 60 mL/min — ABNORMAL LOW (ref >90)
GFR calc non Af Amer: 52 mL/min — ABNORMAL LOW (ref >90)
Sodium: 139 mEq/L (ref 135–145)

## 2012-01-12 LAB — VANCOMYCIN, TROUGH: Vancomycin Tr: 9.2 ug/mL — ABNORMAL LOW (ref 10.0–20.0)

## 2012-01-12 LAB — CBC
MCH: 31.1 pg (ref 26.0–34.0)
MCHC: 33.2 g/dL (ref 30.0–36.0)
Platelets: 338 10*3/uL (ref 150–400)
RBC: 4.05 MIL/uL (ref 3.87–5.11)

## 2012-01-12 MED ORDER — SODIUM CHLORIDE 0.9 % IV SOLN
750.0000 mg | Freq: Two times a day (BID) | INTRAVENOUS | Status: DC
  Administered 2012-01-13: 750 mg via INTRAVENOUS
  Filled 2012-01-12 (×2): qty 750

## 2012-01-12 MED ORDER — CARVEDILOL 25 MG PO TABS
25.0000 mg | ORAL_TABLET | Freq: Two times a day (BID) | ORAL | Status: DC
  Administered 2012-01-12 – 2012-01-14 (×4): 25 mg via ORAL
  Filled 2012-01-12 (×7): qty 1

## 2012-01-12 NOTE — Progress Notes (Signed)
Subjective: Up in chair visiting with daughter. NAD  Objective: Vital signs Filed Vitals:   01/11/12 1432 01/11/12 2226 01/12/12 0537 01/12/12 1050  BP: 143/63 166/67 140/63 158/83  Pulse: 68 64 102 93  Temp: 97.9 F (36.6 C) 98 F (36.7 C) 98.4 F (36.9 C)   TempSrc: Oral Oral Oral   Resp: 18 18 18    Height:      Weight:   63.5 kg (139 lb 15.9 oz)   SpO2: 95% 92% 92%    Weight change:  Last BM Date: 01/11/12  Intake/Output from previous day: 04/30 0701 - 05/01 0700 In: 488 [P.O.:480; IV Piggyback:8] Out: 3350 [Urine:3350] Total I/O In: 360 [P.O.:360] Out: 125 [Urine:125]   Physical Exam: General: Alert, awake, oriented x3, in no acute distress. HEENT: No bruits, no goiter. Heart: irregularly irregular without murmurs, rubs, gallops. Lungs:  Normal effort. Breath sounds clear to auscultation bilaterally. Abdomen: Soft, nontender, nondistended, positive bowel sounds. Extremities: No clubbing cyanosis or edema with positive pedal pulses. No LEE Neuro: Grossly intact, nonfocal. Skin: psoriatic changes. Less scaly. Dressing right ankle dry/intact    Lab Results: Basic Metabolic Panel:  Basename 01/12/12 0445 01/11/12 0500  NA 139 136  K 3.6 3.9  CL 100 102  CO2 28 29  GLUCOSE 103* 91  BUN 21 20  CREATININE 0.94 0.92  CALCIUM 9.1 8.6  MG -- --  PHOS -- --   Liver Function Tests: No results found for this basename: AST:2,ALT:2,ALKPHOS:2,BILITOT:2,PROT:2,ALBUMIN:2 in the last 72 hours No results found for this basename: LIPASE:2,AMYLASE:2 in the last 72 hours No results found for this basename: AMMONIA:2 in the last 72 hours CBC:  Basename 01/12/12 0445 01/11/12 0500  WBC 18.6* 16.9*  NEUTROABS -- --  HGB 12.6 11.2*  HCT 37.9 34.3*  MCV 93.6 95.5  PLT 338 263   Cardiac Enzymes: No results found for this basename: CKTOTAL:3,CKMB:3,CKMBINDEX:3,TROPONINI:3 in the last 72 hours BNP: No results found for this basename: PROBNP:3 in the last 72  hours D-Dimer: No results found for this basename: DDIMER:2 in the last 72 hours CBG: No results found for this basename: GLUCAP:6 in the last 72 hours Hemoglobin A1C: No results found for this basename: HGBA1C in the last 72 hours Fasting Lipid Panel: No results found for this basename: CHOL,HDL,LDLCALC,TRIG,CHOLHDL,LDLDIRECT in the last 72 hours Thyroid Function Tests: No results found for this basename: TSH,T4TOTAL,FREET4,T3FREE,THYROIDAB in the last 72 hours Anemia Panel: No results found for this basename: VITAMINB12,FOLATE,FERRITIN,TIBC,IRON,RETICCTPCT in the last 72 hours Coagulation: No results found for this basename: LABPROT:2,INR:2 in the last 72 hours Urine Drug Screen: Drugs of Abuse  No results found for this basename: labopia, cocainscrnur, labbenz, amphetmu, thcu, labbarb    Alcohol Level: No results found for this basename: ETH:2 in the last 72 hours Urinalysis: No results found for this basename: COLORURINE:2,APPERANCEUR:2,LABSPEC:2,PHURINE:2,GLUCOSEU:2,HGBUR:2,BILIRUBINUR:2,KETONESUR:2,PROTEINUR:2,UROBILINOGEN:2,NITRITE:2,LEUKOCYTESUR:2 in the last 72 hours Misc. Labs:  Recent Results (from the past 240 hour(s))  URINE CULTURE     Status: Normal   Collection Time   01/08/12  4:05 PM      Component Value Range Status Comment   Specimen Description URINE, CLEAN CATCH   Final    Special Requests NONE   Final    Culture  Setup Time [id number]   Final    Colony Count 30,000 COLONIES/ML   Final    Culture PROTEUS MIRABILIS   Final    Report Status 01/11/2012 FINAL   Final    Organism ID, Bacteria PROTEUS MIRABILIS   Final  WOUND CULTURE     Status: Normal   Collection Time   01/08/12  5:00 PM      Component Value Range Status Comment   Specimen Description LEG RIGHT CALF   Final    Special Requests Immunocompromised   Final    Gram Stain     Final    Value: NO WBC SEEN     NO SQUAMOUS EPITHELIAL CELLS SEEN     FEW GRAM POSITIVE COCCI     IN PAIRS    Culture     Final    Value: ABUNDANT METHICILLIN RESISTANT STAPHYLOCOCCUS AUREUS     Note: RIFAMPIN AND GENTAMICIN SHOULD NOT BE USED AS SINGLE DRUGS FOR TREATMENT OF STAPH INFECTIONS. CRITICAL RESULT CALLED TO, READ BACK BY AND VERIFIED WITH: TERA D 01/11/12 AT 815 AM BY [facility]   Report Status 01/11/2012 FINAL   Final    Organism ID, Bacteria METHICILLIN RESISTANT STAPHYLOCOCCUS AUREUS   Final   CULTURE, BLOOD (ROUTINE X 2)     Status: Normal (Preliminary result)   Collection Time   01/08/12  7:35 PM      Component Value Range Status Comment   Specimen Description BLOOD LEFT HAND   Final    Special Requests BOTTLES DRAWN AEROBIC AND ANAEROBIC 5CC EACH   Final    Culture  Setup Time [id number]   Final    Culture     Final    Value:        BLOOD CULTURE RECEIVED NO GROWTH TO DATE CULTURE WILL BE HELD FOR 5 DAYS BEFORE ISSUING A FINAL NEGATIVE REPORT   Report Status PENDING   Incomplete   CULTURE, BLOOD (ROUTINE X 2)     Status: Normal (Preliminary result)   Collection Time   01/08/12  7:50 PM      Component Value Range Status Comment   Specimen Description BLOOD LEFT ARM   Final    Special Requests BOTTLES DRAWN AEROBIC AND ANAEROBIC [facility] EACH   Final    Culture  Setup Time [id number]   Final    Culture     Final    Value:        BLOOD CULTURE RECEIVED NO GROWTH TO DATE CULTURE WILL BE HELD FOR 5 DAYS BEFORE ISSUING A FINAL NEGATIVE REPORT   Report Status PENDING   Incomplete   MRSA PCR SCREENING     Status: Abnormal   Collection Time   01/08/12  8:52 PM      Component Value Range Status Comment   MRSA by PCR POSITIVE (*) NEGATIVE  Final     Studies/Results: Dg Chest Port 1 View  01/10/2012  *RADIOLOGY REPORT*  Clinical Data: Shortness of breath, weakness  PORTABLE CHEST - 1 VIEW  Comparison: 01/08/2012  Findings: Mild interstitial edema, increased, with bilateral pleural effusions, left greater than right.  Associated left basilar atelectasis.  The heart is mildly enlarged.   Degenerative changes of the visualized thoracolumbar spine.  IMPRESSION: Cardiomegaly with mild interstitial edema, increased.  Small bilateral pleural effusions, left greater than right, with associated left basilar atelectasis.  Original Report Authenticated By: Charline Bills, M.D.    Medications: Scheduled Meds:   . aspirin EC  162 mg Oral Daily  . atorvastatin  20 mg Oral QODAY  . carvedilol  12.5 mg Oral BID WC  . Chlorhexidine Gluconate Cloth  6 each Topical Q0600  . clobetasol cream   Topical BID  . clopidogrel  75 mg Oral QAC breakfast  .  diltiazem  120 mg Oral Daily  . feeding supplement  237 mL Oral TID BM  . folic acid  1 mg Oral Daily  . heparin  5,000 Units Subcutaneous Q8H  . iron polysaccharides  150 mg Oral Q breakfast  . isosorbide-hydrALAZINE  0.5 tablet Oral BID  . levofloxacin  750 mg Oral Q48H  . mupirocin ointment  1 application Nasal BID  . pantoprazole  40 mg Oral Q1200  . potassium chloride SA  20 mEq Oral Daily  . sodium chloride  3 mL Intravenous Q12H  . vancomycin  1,000 mg Intravenous Q24H  . DISCONTD: aztreonam  2 g Intravenous Q8H  . DISCONTD: furosemide  40 mg Intravenous Q12H  . DISCONTD: furosemide  40 mg Intravenous Q12H   Continuous Infusions:  PRN Meds:.acetaminophen, levalbuterol, menthol-cetylpyridinium, ondansetron (ZOFRAN) IV, oxyCODONE, polyvinyl alcohol  Assessment/Plan:  Principal Problem:  *Cellulitis Active Problems:  Leukocytosis  Hypertension  Atrial fibrillation  Psoriasis  Chronic diastolic HF (heart failure)  HCAP (healthcare-associated pneumonia)  #1 cellulitis of the right ankle. This is superimposed on the psoriasis. MRI yields no evidence osteomyelitis or abcess. Continue vancomycin for now. Venous dopplers prelim report yields no DVT or SVT in bilateral LE. Low albumin may be contributing as well. Pain better controlled. Vanc day #4, Azactam  Discontinued 01/11/12. Appreciate wound consult. ABI suggest moderate  disease in right LE. No need for vascular input at this time. Pain is localized in ankle.  #2 healthcare associated pneumonia: Much improved. Continue broad-spectrum antibiotics. Blood cultures no growth to date. 4 days levaquin complete. Continue oxygen and nebs. Swallow eval unremarkable.  #3 "Fullness" in the chest: No further episodes. It is probably related to the pneumonia and possible exacerbation of his CHF. EKG is nonischemic. Troponins are negative.  #4 history of diastolic heart failure with likely acute exacerbation: The pleural effusions could suggest an acute exacerbation of the same. Volume status neg 6.4L. Intravenous Lasix x 6 doses given.  Will repeat CXR this AM. If improved then can consider transition to oral lasix. Strict I&O's.  #5 leukocytosis: This is chronic. Antibiotics as above. Afebrile, non-toxic appearing. She was supposed to see a hematologist however, because of the stroke, that was put off. Continue to monitor.  #6 history of atrial fibrillation: Continue with her current medications.  She is not on anticoagulation. On 4/28 she went into afib with mild RVR for about 2 hours and then converted back to NSR. Today afib with mild RVR. Will increase BB and monitor.  #7 history of hypertension: SBP range 129-176. Continue Bidil. Lisinopril on hold. Continue to monitor.  #8 Psoriasis: Appears to have worsened over the last month or so. She is supposed to see a dermatologist at [facility] on Monday, but will have to reschedule that appointment.  #9 Malnutrition: Start Ensure. Appetite improving.      LOS: 4 days   [facility] M 01/12/2012, 11:30 AM

## 2012-01-12 NOTE — Progress Notes (Signed)
Physical Therapy Treatment Patient Details Name: Sara [name] MRN: [id number] DOB: 02-16-1919 Today's Date: 01/12/2012 Time: 8295-6213 PT Time Calculation (min): 29 min  PT Assessment / Plan / Recommendation Comments on Treatment Session  78 y.o. female admitted to [facility] for R ankle cellulitis.  She is progressing well today with gait with the RW, but still presents as a high fall risk.  She conitnues to be appropriate for SNF placement for rehab before returning to her independent living apartment.       Follow Up Recommendations  Skilled nursing facility    Equipment Recommendations  Defer to next venue    Frequency Min 3X/week   Plan Discharge plan remains appropriate;Frequency remains appropriate    Precautions / Restrictions Precautions Precautions: Fall   Pertinent Vitals/Pain 7/10 right ankle pain at rest    Mobility  Bed Mobility Supine to Sit: HOB elevated;With rails;6: Modified independent (Device/Increase time) Details for Bed Mobility Assistance: relied heavily on arms to pull up to sitting, but did so without therapist's assistance.    Transfers Sit to Stand: 4: Min guard;From elevated surface;With upper extremity assist;From bed Stand to Sit: 4: Min guard;With upper extremity assist;To chair/3-in-1;With armrests Details for Transfer Assistance: min guard asssit for safety and balance during transition   Ambulation/Gait Ambulation/Gait Assistance: 4: Min guard Ambulation Distance (Feet): 180 Feet Assistive device: Rolling walker Ambulation/Gait Assistance Details: min guard assist for safety and balance.  Slow gait speed and flexed trunk posture.  Patient occationally self correcting posture without cues today.  She also is wearing her slip on shoes which she believes provides more support than the hospital socks.   Gait Pattern: Step-through pattern;Shuffle;Trunk flexed Gait velocity: < 1.8 ft/sec indicating risk for recurrent falls.       PT Goals Acute Rehab  PT Goals Time For Goal Achievement: 01/17/12 PT Goal: Supine/Side to Sit - Progress: Met PT Goal: Sit to Stand - Progress: Progressing toward goal PT Goal: Ambulate - Progress: Progressing toward goal  Visit Information  Last PT Received On: 01/12/12 Assistance Needed: +1    Subjective Data  Subjective: The patient reports that she bruises easily, so she did not want assist unless needed.     Cognition  Overall Cognitive Status: Appears within functional limits for tasks assessed/performed Arousal/Alertness: Awake/alert Orientation Level: Appears intact for tasks assessed Behavior During Session: [facility] for tasks performed    End of Session PT - End of Session Equipment Utilized During Treatment:  (RW, and patient's shoes from home) Activity Tolerance: Patient limited by fatigue Patient left: in chair;with call bell/phone within reach    [place] B. Albertus Chiarelli, PT, DPT [phone] 01/12/2012, 5:38 PM

## 2012-01-12 NOTE — Progress Notes (Signed)
Speech Language Pathology Dysphagia Treatment Patient Details Name: Sara [name] MRN: [id number] DOB: Sep 26, 1918 Today's Date: 01/12/2012 Time: 7829-5621 SLP Time Calculation (min): 14 min  Assessment / Plan / Recommendation Clinical Impression  SLP follow up for education regarding xerostomia compensatory strategies.  Note intake documented as 100% but pt reports this is an error - however her daughter states "she's been doing good."  Xerostomia continues during hospital stay but not significant prior to admission per pt.  Advised pt to go through medication list with pharmacy to assure xerostomia not side effect and seek options if needed with Md approval.  Pt's daughter has purchased Biotene oral spray for pt to use.  Again educated to importance of oral care with dry mouth.  Pt and daughter verbalized understanding to information to compensate for xerostomia.  No further SLP indicated.    Diet Recommendation  Continue with Current Diet: Regular;Thin liquid    SLP Plan All goals met   Pertinent Vitals/Pain CTA, afebrile, breathing better per pt   Swallowing Goals  SLP Swallowing Goals Swallow Study Goal #2 - Progress: Met Swallow Study Goal #3 - Progress: Met  General Temperature Spikes Noted: No Respiratory Status: Room air Behavior/Cognition: Alert;Cooperative Oral Cavity - Dentition: Adequate natural dentition Patient Positioning: Upright in bed   Dysphagia Treatment Treatment focused on: Patient/family/caregiver education Family/Caregiver Educated: Daughter and Pt Treatment Methods/Modalities: Other (comment) (education regarding xerostomia compensatory strategies) Patient observed directly with PO's: No Reason PO's not observed: Other (comment) (education session)   Donavan Burnet, MS [facility] SLP [phone]

## 2012-01-12 NOTE — Progress Notes (Signed)
ANTIBIOTIC CONSULT NOTE - Follow Up  Pharmacy Consult for Vancomycin Indication: HCAP, cellulitis  Allergies  Allergen Reactions  . Penicillins Other (See Comments)    unknown  . Triamterene Other (See Comments)    unknown  . Alendronate Sodium Rash   Patient Measurements: Height: 5\' 5"  (165.1 cm) Weight: 139 lb 15.9 oz (63.5 kg) IBW/kg (Calculated) : 57   Vital Signs: Temp: 98.2 F (36.8 C) (05/01 1300) Temp src: Oral (05/01 1300) BP: 124/63 mmHg (05/01 1300) Pulse Rate: 74  (05/01 1300) Intake/Output from previous day: 04/30 0701 - 05/01 0700 In: 488 [P.O.:480; IV Piggyback:8] Out: 3350 [Urine:3350] Intake/Output from this shift: Total I/O In: 360 [P.O.:360] Out: 125 [Urine:125]  Labs:  [facility] 01/12/12 0445 01/11/12 0500 01/10/12 0444  WBC 18.6* 16.9* 15.4*  HGB 12.6 11.2* 10.5*  PLT 338 263 261  LABCREA -- -- --  CREATININE 0.94 0.92 0.91   Estimated Creatinine Clearance: 35.8 ml/min (by C-G formula based on Cr of 0.94).  Basename 01/12/12 1652 01/11/12 1725  VANCOTROUGH 9.2* 25.8*  VANCOPEAK -- --  Drue Dun -- --  GENTTROUGH -- --  GENTPEAK -- --  GENTRANDOM -- --  TOBRATROUGH -- --  TOBRAPEAK -- --  TOBRARND -- --  AMIKACINPEAK -- --  AMIKACINTROU -- --  AMIKACIN -- --     Microbiology: Recent Results (from the past 720 hour(s))  URINE CULTURE     Status: Normal   Collection Time   01/08/12  4:05 PM      Component Value Range Status Comment   Specimen Description URINE, CLEAN CATCH   Final    Special Requests NONE   Final    Culture  Setup Time [id number]   Final    Colony Count 30,000 COLONIES/ML   Final    Culture PROTEUS MIRABILIS   Final    Report Status 01/11/2012 FINAL   Final    Organism ID, Bacteria PROTEUS MIRABILIS   Final   WOUND CULTURE     Status: Normal   Collection Time   01/08/12  5:00 PM      Component Value Range Status Comment   Specimen Description LEG RIGHT CALF   Final    Special Requests Immunocompromised    Final    Gram Stain     Final    Value: NO WBC SEEN     NO SQUAMOUS EPITHELIAL CELLS SEEN     FEW GRAM POSITIVE COCCI     IN PAIRS   Culture     Final    Value: ABUNDANT METHICILLIN RESISTANT STAPHYLOCOCCUS AUREUS     Note: RIFAMPIN AND GENTAMICIN SHOULD NOT BE USED AS SINGLE DRUGS FOR TREATMENT OF STAPH INFECTIONS. CRITICAL RESULT CALLED TO, READ BACK BY AND VERIFIED WITH: TERA D 01/11/12 AT 815 AM BY [facility]   Report Status 01/11/2012 FINAL   Final    Organism ID, Bacteria METHICILLIN RESISTANT STAPHYLOCOCCUS AUREUS   Final   CULTURE, BLOOD (ROUTINE X 2)     Status: Normal (Preliminary result)   Collection Time   01/08/12  7:35 PM      Component Value Range Status Comment   Specimen Description BLOOD LEFT HAND   Final    Special Requests BOTTLES DRAWN AEROBIC AND ANAEROBIC [facility]   Final    Culture  Setup Time [id number]   Final    Culture     Final    Value:        BLOOD CULTURE RECEIVED NO GROWTH TO DATE  CULTURE WILL BE HELD FOR 5 DAYS BEFORE ISSUING A FINAL NEGATIVE REPORT   Report Status PENDING   Incomplete   CULTURE, BLOOD (ROUTINE X 2)     Status: Normal (Preliminary result)   Collection Time   01/08/12  7:50 PM      Component Value Range Status Comment   Specimen Description BLOOD LEFT ARM   Final    Special Requests BOTTLES DRAWN AEROBIC AND ANAEROBIC [facility] EACH   Final    Culture  Setup Time [id number]   Final    Culture     Final    Value:        BLOOD CULTURE RECEIVED NO GROWTH TO DATE CULTURE WILL BE HELD FOR 5 DAYS BEFORE ISSUING A FINAL NEGATIVE REPORT   Report Status PENDING   Incomplete   MRSA PCR SCREENING     Status: Abnormal   Collection Time   01/08/12  8:52 PM      Component Value Range Status Comment   MRSA by PCR POSITIVE (*) NEGATIVE  Final    Medical History: Past Medical History  Diagnosis Date  . Hypertension   . Atrial fibrillation   . Psoriasis   . Congestive heart failure (CHF)   . Basal cell carcinoma   . History of CVA (cerebrovascular  accident)   . Coronary artery disease   . Myocardial infarction   . CHF (congestive heart failure)   . Stroke   . Shortness of breath   . H/O hiatal hernia    Medications:  Scheduled:     . aspirin EC  162 mg Oral Daily  . atorvastatin  20 mg Oral QODAY  . carvedilol  25 mg Oral BID WC  . Chlorhexidine Gluconate Cloth  6 each Topical Q0600  . clobetasol cream   Topical BID  . clopidogrel  75 mg Oral QAC breakfast  . diltiazem  120 mg Oral Daily  . feeding supplement  237 mL Oral TID BM  . folic acid  1 mg Oral Daily  . heparin  5,000 Units Subcutaneous Q8H  . iron polysaccharides  150 mg Oral Q breakfast  . isosorbide-hydrALAZINE  0.5 tablet Oral BID  . levofloxacin  750 mg Oral Q48H  . mupirocin ointment  1 application Nasal BID  . pantoprazole  40 mg Oral Q1200  . potassium chloride SA  20 mEq Oral Daily  . sodium chloride  3 mL Intravenous Q12H  . vancomycin  1,000 mg Intravenous Q24H  . DISCONTD: carvedilol  12.5 mg Oral BID WC  . DISCONTD: furosemide  40 mg Intravenous Q12H   Assessment:  Day #5 Vancomycin/Aztrenam/Levaquin for MRSA ankle cellulitis/HCAP/proteus UTI with only 30k colonies  Full note written 01/11/12.  Vanc trough below target.  Stable renal function  Goal of Therapy:  Vancomycin trough level 15-20 mcg/ml, Aztreonam and Levaquin adjustments per renal function  Plan:  1. Change Vancomycin to 750mg  IV q12h. 2. Cont to f/u daily.  Charolotte Eke, PharmD, pager [phone]. 01/12/2012,6:28 PM.

## 2012-01-13 ENCOUNTER — Encounter (HOSPITAL_COMMUNITY): Payer: Self-pay | Admitting: Physical Medicine and Rehabilitation

## 2012-01-13 DIAGNOSIS — L03119 Cellulitis of unspecified part of limb: Secondary | ICD-10-CM

## 2012-01-13 DIAGNOSIS — I5033 Acute on chronic diastolic (congestive) heart failure: Secondary | ICD-10-CM

## 2012-01-13 DIAGNOSIS — R5381 Other malaise: Secondary | ICD-10-CM

## 2012-01-13 DIAGNOSIS — L02419 Cutaneous abscess of limb, unspecified: Secondary | ICD-10-CM

## 2012-01-13 DIAGNOSIS — D696 Thrombocytopenia, unspecified: Secondary | ICD-10-CM

## 2012-01-13 DIAGNOSIS — I4891 Unspecified atrial fibrillation: Secondary | ICD-10-CM

## 2012-01-13 LAB — BASIC METABOLIC PANEL
Chloride: 101 mEq/L (ref 96–112)
GFR calc Af Amer: 61 mL/min — ABNORMAL LOW (ref >90)
Potassium: 4.1 mEq/L (ref 3.5–5.1)

## 2012-01-13 LAB — PROTIME-INR: INR: 1.06 (ref 0.00–1.49)

## 2012-01-13 LAB — CBC
Platelets: 319 10*3/uL (ref 150–400)
RDW: 16.1 % — ABNORMAL HIGH (ref 11.5–15.5)
WBC: 17.1 10*3/uL — ABNORMAL HIGH (ref 4.0–10.5)

## 2012-01-13 MED ORDER — DOXYCYCLINE HYCLATE 100 MG PO TABS
100.0000 mg | ORAL_TABLET | Freq: Two times a day (BID) | ORAL | Status: DC
  Administered 2012-01-13 – 2012-01-14 (×3): 100 mg via ORAL
  Filled 2012-01-13 (×4): qty 1

## 2012-01-13 MED ORDER — FUROSEMIDE 40 MG PO TABS
40.0000 mg | ORAL_TABLET | Freq: Every day | ORAL | Status: DC
  Administered 2012-01-13 – 2012-01-14 (×2): 40 mg via ORAL
  Filled 2012-01-13 (×2): qty 1

## 2012-01-13 MED ORDER — LISINOPRIL 20 MG PO TABS
20.0000 mg | ORAL_TABLET | Freq: Every day | ORAL | Status: DC
  Administered 2012-01-13: 20 mg via ORAL
  Filled 2012-01-13 (×2): qty 1

## 2012-01-13 NOTE — Consult Note (Signed)
Physical Medicine and Rehabilitation Consult Reason for Consult: Worsening cellulitis R-ankle and SOB. Referring Phsyician:  Dr. Danie Binder.  HPI:   Sara [name] is an 78 y.o. female with history of HTN, A Fib, recent CVA 3/29 with [facility] rehab at [facility].  [place] with reports of difficulty walking due to redness, drainage, and pain right ankle. Patient with worsening of right ankle symptoms for the past 2 months and  with complaints of SOB. Admitted on 04/27  and  started on IV antibiotics for cellulitis, HAP and IV diuretics for acute exacerbation of CHF. Wound culture +MRSA. UCS +proteus mirabilis. Did develop A fib with RVR  And BB dose increased.Patient has had improvement in symptoms and therapies initiated.  MD recommending CIR.  Review of Systems  Respiratory: Positive for shortness of breath. Negative for cough.   Cardiovascular: Negative for chest pain and palpitations.  Gastrointestinal: Negative for abdominal pain.  Musculoskeletal: Positive for myalgias and joint pain (Right ankle pain continues- a little better.  Right wrist pain.).  Psychiatric/Behavioral: The patient has insomnia.    Past Medical History  Diagnosis Date  . Hypertension   . Atrial fibrillation   . Psoriasis   . Congestive heart failure (CHF)   . Basal cell carcinoma   . History of CVA (cerebrovascular accident)   . Coronary artery disease   . Myocardial infarction   . CHF (congestive heart failure)   . Stroke   . Shortness of breath   . H/O hiatal hernia    Past Surgical History  Procedure Date  . Tonsillectomy   . Dilation and curettage of uterus   . Eye surgery   . Cataracts    Family History  Problem Relation Age of Onset  . Stomach cancer Mother   . Pneumonia Father    Social History:  Widowed.  [place] [facility] [place] [facility]. Reports was at wheel chair level when sent back to her apartment.  She reports that she has never smoked. She has never used  smokeless tobacco. She reports that she does not drink alcohol or use illicit drugs. Allergies:  Allergies  Allergen Reactions  . Penicillins Other (See Comments)    unknown  . Triamterene Other (See Comments)    unknown  . Alendronate Sodium Rash   Medications Prior to Admission  Medication Sig Dispense Refill  . acetaminophen (TYLENOL) 500 MG tablet Take 500 mg by mouth every 4 (four) hours as needed. Pain.      Marland Kitchen aspirin EC 81 MG tablet Take 162 mg by mouth daily.      . carvedilol (COREG) 12.5 MG tablet Take 12.5 mg by mouth 2 (two) times daily with a meal.      . clobetasol (TEMOVATE) 0.05 % external solution Apply 1 application topically 2 (two) times daily. Apply to affected areas for psoriasis.      Marland Kitchen clopidogrel (PLAVIX) 75 MG tablet Take 75 mg by mouth daily.      . Coenzyme Q10 (CO Q 10) 100 MG CAPS Take 1 capsule by mouth daily.      Tery Sanfilippo Calcium (STOOL SOFTENER PO) Take 1 capsule by mouth daily.      . Emollient (HYLATOPIC) FOAM Apply 1 application topically 2 (two) times daily as needed. Psoriasis.      . folic acid (FOLVITE) 1 MG tablet Take 1 mg by mouth daily.      . furosemide (LASIX) 40 MG tablet  Take 40 mg by mouth every morning.      . iron polysaccharides (NIFEREX) 150 MG capsule Take 1 capsule (150 mg total) by mouth daily with breakfast.      . isosorbide-hydrALAZINE (BIDIL) 20-37.5 MG per tablet Take 0.5 tablets by mouth 2 (two) times daily.      Marland Kitchen lisinopril (PRINIVIL,ZESTRIL) 40 MG tablet Take 20 mg by mouth daily.      Marland Kitchen MAGNESIUM PO Take 1 tablet by mouth daily. 500mg       . methotrexate 2.5 MG tablet Take 7.5 mg by mouth once a week. She takes on Saturdays.      . Omega-3 Fatty Acids (FISH OIL) 1000 MG CAPS Take 2,000 mg by mouth daily.      Marland Kitchen oxyCODONE (OXY IR/ROXICODONE) 5 MG immediate release tablet Take 5-10 mg by mouth See admin instructions. Take 5mg  by mouth every 4 hours as needed for mild pain. Take 10mg  by mouth every 4 hours as needed for  severe pain.      . pantoprazole (PROTONIX) 40 MG tablet Take 40 mg by mouth daily.      . potassium chloride SA (K-DUR,KLOR-CON) 20 MEQ tablet Take 20 mEq by mouth daily.      . rosuvastatin (CRESTOR) 20 MG tablet Take 10 mg by mouth every other day.      . triamcinolone ointment (KENALOG) 0.1 % Apply 1 application topically 2 (two) times daily. To legs and feet for psoriasis.      Marland Kitchen vitamin C (ASCORBIC ACID) 500 MG tablet Take 500 mg by mouth daily.      Marland Kitchen diltiazem (CARDIZEM CD) 120 MG 24 hr capsule Take 120 mg by mouth daily.        Home: Home Living Lives With: Alone Available Help at Discharge: Skilled Nursing Facility Type of Home: Independent living facility Home Access: Level entry Home Layout: One level Home Adaptive Equipment: Walker - rolling;Wheelchair - powered Additional Comments: Pt reports duaghter just got pt a scooter.  She had it one day before coming to hospital.  Functional History: Prior Function Vocation: Retired Comments: Pt had been at [facility] for re[facility], then ALF getting rehab, then returned to her I living apartment.  She was there 1 day before admission to hospital. Functional Status:  Mobility: Bed Mobility Bed Mobility: Supine to Sit Supine to Sit: HOB elevated;With rails;6: Modified independent (Device/Increase time) Transfers Transfers: Sit to Stand;Stand to Sit Sit to Stand: 4: Min guard;From elevated surface;With upper extremity assist;From bed Stand to Sit: 4: Min guard;With upper extremity assist;To chair/3-in-1;With armrests Ambulation/Gait Ambulation/Gait Assistance: 4: Min guard Ambulation Distance (Feet): 180 Feet Assistive device: Rolling walker Ambulation/Gait Assistance Details: min guard assist for safety and balance.  Slow gait speed and flexed trunk posture.  Patient occationally self correcting posture without cues today.  She also is wearing her slip on shoes which she believes provides more support than the hospital socks.   Gait  Pattern: Step-through pattern;Shuffle;Trunk flexed Gait velocity: < 1.8 ft/sec indicating risk for recurrent falls.      ADL: ADL Where Assessed - Eating/Feeding: Chair Grooming: Performed;Brushing hair;Teeth care;Wash/dry face;Wash/dry hands Where Assessed - Grooming: Standing at sink;Other (comment) (pt stood for 10 minutes- which is MUCH improved !) Upper Body Bathing: Simulated;Minimal assistance Where Assessed - Upper Body Bathing: Supported;Sitting, chair Lower Body Bathing: Performed;Maximal assistance Where Assessed - Lower Body Bathing: Sit to stand from bed Upper Body Dressing: Simulated;Minimal assistance Where Assessed - Upper Body Dressing: Sitting, chair;Unsupported Lower Body Dressing:  Performed;Maximal assistance Where Assessed - Lower Body Dressing: Sit to stand from chair Toilet Transfer: Simulated;Other (comment) (bed to chair) Toilet Transfer Method: Stand pivot Toileting - Clothing Manipulation: Not assessed Where Assessed - Toileting Clothing Manipulation: Not assessed Toileting - Hygiene: Not assessed Where Assessed - Toileting Hygiene: Not assessed Tub/Shower Transfer: Not assessed Tub/Shower Transfer Method: Not assessed Ambulation Related to ADLs: Pt able to bear weight on R foot today without any pain reported.    Pt able to take steps with min A (hand held by OT)  Pt agrees she needs SNF [facility]  Cognition: Cognition Arousal/Alertness: Awake/alert Cognition Overall Cognitive Status: Appears within functional limits for tasks assessed/performed Arousal/Alertness: Awake/alert Orientation Level: Appears intact for tasks assessed Behavior During Session: [facility] for tasks performed  Blood pressure 164/65, pulse 63, temperature 98.3 F (36.8 C), temperature source Oral, resp. rate 18, height 5\' 5"  (1.651 m), weight 61.2 kg (134 lb 14.7 oz), SpO2 92.00%. Physical Exam  Nursing note and vitals reviewed. Constitutional: She is oriented to person, place,  and time. She appears well-developed and well-nourished.  HENT:  Head: Normocephalic and atraumatic.  Neck: Normal range of motion. Neck supple.  Cardiovascular: Normal rate and regular rhythm.   Pulmonary/Chest: Effort normal. She has decreased breath sounds in the right lower field and the left lower field.  Abdominal: Soft. Bowel sounds are normal.  Musculoskeletal: She exhibits tenderness (right wrist and right ankle). She exhibits no edema.  Neurological: She is alert and oriented to person, place, and time.  Skin: Skin is warm and dry.       Dry flaky skin BLE. Multiple foam dressings right shin and right ankle.  diffusely ecchymotic area right dorsal wrist with tenderness.   manual muscle testings: The right deltoid, biceps, triceps, grip 5/5 Left bicep, tricep, grip, deltoid 4/5 Right hip flexor knee extensor ankle dorsiflexor 5/5 Left hip flexor knee extensor ankle dorsiflexor 4+/5 Sensation is intact to light touch in bilateral upper and bilateral lower extremities Tone is normal in bilateral upper and lower extremities Cerebellar testing shows mildly impaired finger nose to finger testing in the left upper extremity  Results for orders placed during the hospital encounter of 01/08/12 (from the past 24 hour(s))  VANCOMYCIN, TROUGH     Status: Abnormal   Collection Time   01/12/12  4:52 PM      Component Value Range   Vancomycin Tr 9.2 (*) 10.0 - 20.0 (ug/mL)  CBC     Status: Abnormal   Collection Time   01/13/12  4:55 AM      Component Value Range   WBC 17.1 (*) 4.0 - 10.5 (K/uL)   RBC 3.80 (*) 3.87 - 5.11 (MIL/uL)   Hemoglobin 11.8 (*) 12.0 - 15.0 (g/dL)   HCT 16.1 (*) 09.6 - 46.0 (%)   MCV 93.9  78.0 - 100.0 (fL)   MCH 31.1  26.0 - 34.0 (pg)   MCHC 33.1  30.0 - 36.0 (g/dL)   RDW 04.5 (*) 40.9 - 15.5 (%)   Platelets 319  150 - 400 (K/uL)  BASIC METABOLIC PANEL     Status: Abnormal   Collection Time   01/13/12  4:55 AM      Component Value Range   Sodium 136  135 - 145  (mEq/L)   Potassium 4.1  3.5 - 5.1 (mEq/L)   Chloride 101  96 - 112 (mEq/L)   CO2 26  19 - 32 (mEq/L)   Glucose, Bld 96  70 -  99 (mg/dL)   BUN 25 (*) 6 - 23 (mg/dL)   Creatinine, Ser 7.82  0.50 - 1.10 (mg/dL)   Calcium 9.3  8.4 - 95.6 (mg/dL)   GFR calc non Af Amer 53 (*) >90 (mL/min)   GFR calc Af Amer 61 (*) >90 (mL/min)   Dg Chest Port 1 View  01/12/2012  *RADIOLOGY REPORT*  Clinical Data: Chest fullness, cellulitis right leg, pneumonia  PORTABLE CHEST - 1 VIEW  Comparison: Portable exam 1225 hours compared to 01/10/2012  Findings: Enlargement of cardiac silhouette. Decreased pulmonary vascular congestion. Improved bibasilar aeration and decreased pleural effusions. Dense atherosclerotic calcification aorta. No segmental consolidation or pneumothorax. Diffuse osseous demineralization station. Post-traumatic deformity proximal left humerus.  IMPRESSION: Improved bibasilar aeration.  Original Report Authenticated By: Lollie Marrow, M.D.    Assessment/Plan: Diagnosis: Deconditioning can do to acute exacerbation of congestive heart failure, atrial fibrillation with rapid ventricular response, cellulitis in the lower extremity, subacute CVA 1. Does the need for close, 24 hr/day medical supervision in concert with the patient's rehab needs make it unreasonable for this patient to be served in a less intensive setting? Potentially 2. Co-Morbidities requiring supervision/potential complications: Atrial fibrillation, psoriasis, congestive heart failure, pneumonia 3. Due to skin/wound care, disease management, medication administration and patient education, does the patient require 24 hr/day rehab nursing? Potentially 4. Does the patient require coordinated care of a physician, rehab nurse, PT, OT to address physical and functional deficits in the context of the above medical diagnosis(es)? No Addressing deficits in the following areas: balance, locomotion, transferring and bathing 5. Can the patient  actively participate in an intensive therapy program of at least 3 hrs of therapy per day at least 5 days per week? Yes 6. The potential for patient to make measurable gains while on inpatient rehab is fair 7. Anticipated functional outcomes upon discharge from inpatient rehab are supervision ability with PT, supervision ADLs with OT, not applicable with SLP. 8. Estimated rehab length of stay to reach the above functional goals is: Not applicable 9. Does the patient have adequate social supports to accommodate these discharge functional goals? Yes 10. Anticipated D/C setting: [facility] and then to ALF or independent living 11. Anticipated post D/C treatments: HH therapy 12. Overall Rehab/Functional Prognosis: fair  RECOMMENDATIONS: This patient's condition is appropriate for continued rehabilitative care in the following setting: SNF Patient has agreed to participate in recommended program. Yes Note that insurance prior authorization may be required for reimbursement for recommended care.  Comment:   Jacquelynn Cree 01/13/2012

## 2012-01-13 NOTE — Progress Notes (Signed)
Subjective: Up in chair eating lunch. Reports pain rt hand from previous blood draw. No other complaint. No events during night.   Objective: Vital signs Filed Vitals:   01/12/12 1827 01/12/12 2149 01/13/12 0550 01/13/12 0903  BP: 160/77 150/76 164/65 164/73  Pulse: 68 72 63 68  Temp:  98.3 F (36.8 C) 98.3 F (36.8 C)   TempSrc:  Oral Oral   Resp:  18 18   Height:      Weight:   61.2 kg (134 lb 14.7 oz)   SpO2:  94% 92%    Weight change: -2.3 kg (-5 lb 1.1 oz) Last BM Date: 01/11/12  Intake/Output from previous day: 05/01 0701 - 05/02 0700 In: 600 [P.O.:600] Out: 975 [Urine:975] Total I/O In: 240 [P.O.:240] Out: 500 [Urine:500]   Physical Exam: General: Alert, awake, oriented x3, in no acute distress. HEENT: No bruits, no goiter. Heart: Regular rate and rhythm, without murmurs, rubs, gallops. Lungs: normal effort. Breath sounds clear to auscultation bilaterally. Abdomen: Soft, nontender, nondistended, positive bowel sounds. Extremities: No clubbing cyanosis or edema with positive pedal pulses. Neuro: Grossly intact, nonfocal. Skin: psoriatic changes. drsing right ankle dry/intact    Lab Results: Basic Metabolic Panel:  Basename 01/13/12 0455 01/12/12 0445  NA 136 139  K 4.1 3.6  CL 101 100  CO2 26 28  GLUCOSE 96 103*  BUN 25* 21  CREATININE 0.93 0.94  CALCIUM 9.3 9.1  MG -- --  PHOS -- --   Liver Function Tests: No results found for this basename: AST:2,ALT:2,ALKPHOS:2,BILITOT:2,PROT:2,ALBUMIN:2 in the last 72 hours No results found for this basename: LIPASE:2,AMYLASE:2 in the last 72 hours No results found for this basename: AMMONIA:2 in the last 72 hours CBC:  Basename 01/13/12 0455 01/12/12 0445  WBC 17.1* 18.6*  NEUTROABS -- --  HGB 11.8* 12.6  HCT 35.7* 37.9  MCV 93.9 93.6  PLT 319 338   Cardiac Enzymes: No results found for this basename: CKTOTAL:3,CKMB:3,CKMBINDEX:3,TROPONINI:3 in the last 72 hours BNP: No results found for this  basename: PROBNP:3 in the last 72 hours D-Dimer: No results found for this basename: DDIMER:2 in the last 72 hours CBG: No results found for this basename: GLUCAP:6 in the last 72 hours Hemoglobin A1C: No results found for this basename: HGBA1C in the last 72 hours Fasting Lipid Panel: No results found for this basename: CHOL,HDL,LDLCALC,TRIG,CHOLHDL,LDLDIRECT in the last 72 hours Thyroid Function Tests: No results found for this basename: TSH,T4TOTAL,FREET4,T3FREE,THYROIDAB in the last 72 hours Anemia Panel: No results found for this basename: VITAMINB12,FOLATE,FERRITIN,TIBC,IRON,RETICCTPCT in the last 72 hours Coagulation: No results found for this basename: LABPROT:2,INR:2 in the last 72 hours Urine Drug Screen: Drugs of Abuse  No results found for this basename: labopia, cocainscrnur, labbenz, amphetmu, thcu, labbarb    Alcohol Level: No results found for this basename: ETH:2 in the last 72 hours Urinalysis: No results found for this basename: COLORURINE:2,APPERANCEUR:2,LABSPEC:2,PHURINE:2,GLUCOSEU:2,HGBUR:2,BILIRUBINUR:2,KETONESUR:2,PROTEINUR:2,UROBILINOGEN:2,NITRITE:2,LEUKOCYTESUR:2 in the last 72 hours Misc. Labs:  Recent Results (from the past 240 hour(s))  URINE CULTURE     Status: Normal   Collection Time   01/08/12  4:05 PM      Component Value Range Status Comment   Specimen Description URINE, CLEAN CATCH   Final    Special Requests NONE   Final    Culture  Setup Time [id number]   Final    Colony Count 30,000 COLONIES/ML   Final    Culture PROTEUS MIRABILIS   Final    Report Status 01/11/2012 FINAL   Final  Organism ID, Bacteria PROTEUS MIRABILIS   Final   WOUND CULTURE     Status: Normal   Collection Time   01/08/12  5:00 PM      Component Value Range Status Comment   Specimen Description LEG RIGHT CALF   Final    Special Requests Immunocompromised   Final    Gram Stain     Final    Value: NO WBC SEEN     NO SQUAMOUS EPITHELIAL CELLS SEEN     FEW GRAM  POSITIVE COCCI     IN PAIRS   Culture     Final    Value: ABUNDANT METHICILLIN RESISTANT STAPHYLOCOCCUS AUREUS     Note: RIFAMPIN AND GENTAMICIN SHOULD NOT BE USED AS SINGLE DRUGS FOR TREATMENT OF STAPH INFECTIONS. CRITICAL RESULT CALLED TO, READ BACK BY AND VERIFIED WITH: TERA D 01/11/12 AT 815 AM BY [facility]   Report Status 01/11/2012 FINAL   Final    Organism ID, Bacteria METHICILLIN RESISTANT STAPHYLOCOCCUS AUREUS   Final   CULTURE, BLOOD (ROUTINE X 2)     Status: Normal (Preliminary result)   Collection Time   01/08/12  7:35 PM      Component Value Range Status Comment   Specimen Description BLOOD LEFT HAND   Final    Special Requests BOTTLES DRAWN AEROBIC AND ANAEROBIC 5CC EACH   Final    Culture  Setup Time [id number]   Final    Culture     Final    Value:        BLOOD CULTURE RECEIVED NO GROWTH TO DATE CULTURE WILL BE HELD FOR 5 DAYS BEFORE ISSUING A FINAL NEGATIVE REPORT   Report Status PENDING   Incomplete   CULTURE, BLOOD (ROUTINE X 2)     Status: Normal (Preliminary result)   Collection Time   01/08/12  7:50 PM      Component Value Range Status Comment   Specimen Description BLOOD LEFT ARM   Final    Special Requests BOTTLES DRAWN AEROBIC AND ANAEROBIC [facility] EACH   Final    Culture  Setup Time [id number]   Final    Culture     Final    Value:        BLOOD CULTURE RECEIVED NO GROWTH TO DATE CULTURE WILL BE HELD FOR 5 DAYS BEFORE ISSUING A FINAL NEGATIVE REPORT   Report Status PENDING   Incomplete   MRSA PCR SCREENING     Status: Abnormal   Collection Time   01/08/12  8:52 PM      Component Value Range Status Comment   MRSA by PCR POSITIVE (*) NEGATIVE  Final     Studies/Results: Dg Chest Port 1 View  01/12/2012  *RADIOLOGY REPORT*  Clinical Data: Chest fullness, cellulitis right leg, pneumonia  PORTABLE CHEST - 1 VIEW  Comparison: Portable exam 1225 hours compared to 01/10/2012  Findings: Enlargement of cardiac silhouette. Decreased pulmonary vascular congestion. Improved  bibasilar aeration and decreased pleural effusions. Dense atherosclerotic calcification aorta. No segmental consolidation or pneumothorax. Diffuse osseous demineralization station. Post-traumatic deformity proximal left humerus.  IMPRESSION: Improved bibasilar aeration.  Original Report Authenticated By: Lollie Marrow, M.D.    Medications: Scheduled Meds:   . aspirin EC  162 mg Oral Daily  . atorvastatin  20 mg Oral QODAY  . carvedilol  25 mg Oral BID WC  . Chlorhexidine Gluconate Cloth  6 each Topical Q0600  . clobetasol cream   Topical BID  . clopidogrel  75 mg  Oral QAC breakfast  . diltiazem  120 mg Oral Daily  . feeding supplement  237 mL Oral TID BM  . folic acid  1 mg Oral Daily  . heparin  5,000 Units Subcutaneous Q8H  . iron polysaccharides  150 mg Oral Q breakfast  . isosorbide-hydrALAZINE  0.5 tablet Oral BID  . levofloxacin  750 mg Oral Q48H  . mupirocin ointment  1 application Nasal BID  . pantoprazole  40 mg Oral Q1200  . potassium chloride SA  20 mEq Oral Daily  . sodium chloride  3 mL Intravenous Q12H  . vancomycin  750 mg Intravenous Q12H  . DISCONTD: vancomycin  1,000 mg Intravenous Q24H   Continuous Infusions:  PRN Meds:.acetaminophen, levalbuterol, menthol-cetylpyridinium, ondansetron (ZOFRAN) IV, oxyCODONE, polyvinyl alcohol  Assessment/Plan:  Principal Problem:  *Cellulitis Active Problems:  Leukocytosis  Hypertension  Atrial fibrillation  Psoriasis  Chronic diastolic HF (heart failure)  HCAP (healthcare-associated pneumonia) #1 cellulitis of the right ankle. This is superimposed on the psoriasis. MRI yields no evidence osteomyelitis or abcess. Continue vancomycin for now. Venous dopplers prelim report yields no DVT or SVT in bilateral LE. Low albumin may be contributing as well. Pain better controlled. Vanc day #5, Azactam Discontinued 01/11/12. Appreciate wound consult. ABI suggest moderate disease in right LE. No need for vascular input at this time.  Pain is localized in ankle.  #2 healthcare associated pneumonia: Continues to improve.Repeat xray yields improved areation. Continue broad-spectrum antibiotics. Blood cultures no growth to date. 5 days levaquin complete. Continue oxygen and nebs. Swallow eval unremarkable.  #3 "Fullness" in the chest: No further episodes. It is probably related to the pneumonia and possible exacerbation of his CHF. EKG is nonischemic. Troponins are negative.  #4 history of diastolic heart failure with likely acute exacerbation: The pleural effusions could suggest an acute exacerbation of the same. Volume status neg 6.8L. Intravenous Lasix x 6 doses given.  Chest xray improved so will resume home po lasix. Strict I&O's.  #5 leukocytosis: This is chronic. Antibiotics as above. Afebrile, non-toxic appearing. Stable in range of 16-18. She was supposed to see a hematologist however, because of the stroke, that was put off. Will need OP follow up #6 history of atrial fibrillation: Continue with her current medications. She is not on anticoagulation. On 4/28 she went into afib with mild RVR for about 2 hours and then converted back to NSR. 01/12/12 afib with mild RVR again. Increased BB and today SR. Will monitor.  #7 history of hypertension: SBP range 150-166. Continue Bidil. Will resume  Lisinopril . Continue to monitor.  #8 Psoriasis: Appears to have worsened over the last month or so. She is supposed to see a dermatologist at [facility] on Monday, but will have to reschedule that appointment.  #9 Malnutrition: Start Ensure. Appetite continues to  Improve. #10. Dispo: Not candidate for inpt rehab. Will go back to SNF at [facility]. Likely tomorrow.     LOS: 5 days   [facility] M 01/13/2012, 12:31 PM

## 2012-01-13 NOTE — Progress Notes (Signed)
CSW met with patient and daughter at bedside. Discussed need for SNF. They are agreeable to patient going to [facility]. [facility]. Will follow for discharge.  Sharmin Foulk C. Michie Molnar MSW, LCSW [phone]

## 2012-01-13 NOTE — Progress Notes (Signed)
Pt was seen and examined at bedside. I have reviewed labs and vitals which are stable and pt is hemodynamically stable. I agree with the above's assessment and plan.   Debbora Presto  [facility], pager #: [phone] Main office number: [phone]

## 2012-01-14 DIAGNOSIS — I4891 Unspecified atrial fibrillation: Secondary | ICD-10-CM

## 2012-01-14 DIAGNOSIS — D696 Thrombocytopenia, unspecified: Secondary | ICD-10-CM

## 2012-01-14 DIAGNOSIS — I5033 Acute on chronic diastolic (congestive) heart failure: Secondary | ICD-10-CM

## 2012-01-14 DIAGNOSIS — L02419 Cutaneous abscess of limb, unspecified: Secondary | ICD-10-CM

## 2012-01-14 DIAGNOSIS — L03119 Cellulitis of unspecified part of limb: Secondary | ICD-10-CM

## 2012-01-14 LAB — CBC
HCT: 32.8 % — ABNORMAL LOW (ref 36.0–46.0)
Hemoglobin: 10.9 g/dL — ABNORMAL LOW (ref 12.0–15.0)
RBC: 3.46 MIL/uL — ABNORMAL LOW (ref 3.87–5.11)
RDW: 16.1 % — ABNORMAL HIGH (ref 11.5–15.5)
WBC: 16.7 10*3/uL — ABNORMAL HIGH (ref 4.0–10.5)

## 2012-01-14 LAB — BASIC METABOLIC PANEL
BUN: 23 mg/dL (ref 6–23)
Chloride: 104 mEq/L (ref 96–112)
GFR calc Af Amer: 67 mL/min — ABNORMAL LOW (ref >90)
Potassium: 4.1 mEq/L (ref 3.5–5.1)

## 2012-01-14 MED ORDER — OXYCODONE HCL 5 MG PO TABS: 5.0000 mg | ORAL_TABLET | ORAL | Status: AC | PRN

## 2012-01-14 MED ORDER — DOXYCYCLINE HYCLATE 100 MG PO TABS: 100.0000 mg | ORAL_TABLET | Freq: Two times a day (BID) | ORAL | Status: AC

## 2012-01-14 MED ORDER — POLYVINYL ALCOHOL 1.4 % OP SOLN: 1.0000 [drp] | OPHTHALMIC | Status: AC | PRN

## 2012-01-14 MED ORDER — MENTHOL 3 MG MT LOZG: 1.0000 | LOZENGE | OROMUCOSAL | Status: DC | PRN

## 2012-01-14 MED ORDER — OXYCODONE HCL 5 MG PO TABS: 5.0000 mg | ORAL_TABLET | ORAL | Status: DC

## 2012-01-14 MED ORDER — CARVEDILOL 25 MG PO TABS: 25.0000 mg | ORAL_TABLET | Freq: Two times a day (BID) | ORAL | Status: DC

## 2012-01-14 MED ORDER — ENSURE COMPLETE PO LIQD: 237.0000 mL | Freq: Three times a day (TID) | ORAL | Status: DC

## 2012-01-14 NOTE — Discharge Summary (Signed)
Physician Discharge Summary  Patient ID: Sara [name] MRN: [id number] DOB/AGE: [birth year] 78 y.o.  Admit date: 01/08/2012 Discharge date: 01/14/2012  Primary Care Physician:  Londell Moh, MD, MD   Discharge Diagnoses:  Cellulitis  Principal Problem:  *Cellulitis Active Problems:  Leukocytosis  Hypertension  Atrial fibrillation  Psoriasis  Chronic diastolic HF (heart failure)  HCAP (healthcare-associated pneumonia)   Medication List  As of 01/14/2012 10:58 AM   TAKE these medications         acetaminophen 500 MG tablet   Commonly known as: TYLENOL   Take 500 mg by mouth every 4 (four) hours as needed. Pain.      aspirin EC 81 MG tablet   Take 162 mg by mouth daily.      carvedilol 25 MG tablet   Commonly known as: COREG   Take 1 tablet (25 mg total) by mouth 2 (two) times daily with a meal.      clobetasol 0.05 % external solution   Commonly known as: TEMOVATE   Apply 1 application topically 2 (two) times daily. Apply to affected areas for psoriasis.      clopidogrel 75 MG tablet   Commonly known as: PLAVIX   Take 75 mg by mouth daily.      Co Q 10 100 MG Caps   Take 1 capsule by mouth daily.      diltiazem 120 MG 24 hr capsule   Commonly known as: CARDIZEM CD   Take 120 mg by mouth daily.      doxycycline 100 MG tablet   Commonly known as: VIBRA-TABS   Take 1 tablet (100 mg total) by mouth every 12 (twelve) hours.      feeding supplement Liqd   Take 237 mLs by mouth 3 (three) times daily between meals.      Fish Oil 1000 MG Caps   Take 2,000 mg by mouth daily.      folic acid 1 MG tablet   Commonly known as: FOLVITE   Take 1 mg by mouth daily.      furosemide 40 MG tablet   Commonly known as: LASIX   Take 40 mg by mouth every morning.      HYLATOPIC Foam   Apply 1 application topically 2 (two) times daily as needed. Psoriasis.      iron polysaccharides 150 MG capsule   Commonly known as: NIFEREX   Take 1 capsule (150 mg total) by mouth  daily with breakfast.      isosorbide-hydrALAZINE 20-37.5 MG per tablet   Commonly known as: BIDIL   Take 0.5 tablets by mouth 2 (two) times daily.      lisinopril 40 MG tablet   Commonly known as: PRINIVIL,ZESTRIL   Take 20 mg by mouth daily.      MAGNESIUM PO   Take 1 tablet by mouth daily. 500mg       menthol-cetylpyridinium 3 MG lozenge   Commonly known as: CEPACOL   Take 1 lozenge (3 mg total) by mouth as needed.      methotrexate 2.5 MG tablet   Take 7.5 mg by mouth once a week. She takes on Saturdays.      oxyCODONE 5 MG immediate release tablet   Commonly known as: Oxy IR/ROXICODONE   Take 1-2 tablets (5-10 mg total) by mouth See admin instructions. Take 5mg  by mouth every 4 hours as needed for mild pain. Take 10mg  by mouth every 4 hours as needed for severe pain.  pantoprazole 40 MG tablet   Commonly known as: PROTONIX   Take 40 mg by mouth daily.      polyvinyl alcohol 1.4 % ophthalmic solution   Commonly known as: LIQUIFILM TEARS   Place 1 drop into both eyes as needed.      potassium chloride SA 20 MEQ tablet   Commonly known as: K-DUR,KLOR-CON   Take 20 mEq by mouth daily.      rosuvastatin 20 MG tablet   Commonly known as: CRESTOR   Take 10 mg by mouth every other day.      STOOL SOFTENER PO   Take 1 capsule by mouth daily.      triamcinolone ointment 0.1 %   Commonly known as: KENALOG   Apply 1 application topically 2 (two) times daily. To legs and feet for psoriasis.      vitamin C 500 MG tablet   Commonly known as: ASCORBIC ACID   Take 500 mg by mouth daily.             Disposition and Follow-up: pt is medically stable and ready for discharge to facility. Recommend follow up PCP 1 week.   Consults:  rehabilitation medicine   Physical Exam General: Alert, awake, oriented x3, in no acute distress.  HEENT: No bruits, no goiter. Mucus membranes mouth moist/pink.  Heart: Regular rate and rhythm, without murmurs, rubs, gallops.  Lungs:  normal effort. Breath sounds clear to auscultation bilaterally.  Abdomen: Soft, nontender, nondistended, positive bowel sounds.  Extremities: No clubbing cyanosis or edema with positive pedal pulses.  Neuro: Grossly intact, nonfocal.  Skin: psoriatic changes. drsing right ankle dry/intact   Vital signs in last 24 hours:  Filed Vitals:   01/13/12 2242 01/14/12 0605 01/14/12 0823 01/14/12 1100  BP: 117/66 119/60 112/58 108/58  Pulse: 66 61 72   Temp: 98.7 F (37.1 C) 97.6 F (36.4 C)    SpO2: 96% 93%      Intake/Output from previous day:  Intake/Output Summary (Last 24 hours) at 01/14/12 1144 Last data filed at 01/14/12 0626  Gross per 24 hour  Intake    560 ml  Output   1700 ml  Net  -1140 ml   Lab Results:  Lab 01/14/12 0430 01/13/12 0455 01/12/12 0445 01/11/12 0500 01/10/12 0444  WBC 16.7* 17.1* 18.6* 16.9* 15.4*  HGB 10.9* 11.8* 12.6 11.2* 10.5*  HCT 32.8* 35.7* 37.9 34.3* 32.5*  PLT 278 319 338 263 261   Lab 01/14/12 0430 01/13/12 0455 01/12/12 0445 01/11/12 0500 01/10/12 0444  NA 138 136 139 136 140  K 4.1 4.1 3.6 3.9 4.0  CL 104 101 100 102 103  CO2 25 26 28 29 30   GLUCOSE 99 96 103* 91 92  BUN 23 25* 21 20 19   CREATININE 0.86 0.93 0.94 0.92 0.91  CALCIUM 9.1 9.3 9.1 8.6 8.8    Lab 01/13/12 1413  INR 1.06  PROTIME --   Cardiac markers:  Lab 01/09/12 1029 01/09/12 0242 01/08/12 1950  CKMB 2.3 2.5 3.0  TROPONINI <0.30 <0.30 <0.30  MYOGLOBIN -- -- --    Recent Results (from the past 240 hour(s))  URINE CULTURE     Status: Normal   Collection Time   01/08/12  4:05 PM      Component Value Range Status Comment   Specimen Description URINE, CLEAN CATCH   Final    Special Requests NONE   Final    Culture  Setup Time [id number]   Final  Colony Count 30,000 COLONIES/ML   Final    Culture PROTEUS MIRABILIS   Final    Report Status 01/11/2012 FINAL   Final    Organism ID, Bacteria PROTEUS MIRABILIS   Final   WOUND CULTURE     Status: Normal    Collection Time   01/08/12  5:00 PM      Component Value Range Status Comment   Specimen Description LEG RIGHT CALF   Final    Special Requests Immunocompromised   Final    Gram Stain     Final    Value: NO WBC SEEN     NO SQUAMOUS EPITHELIAL CELLS SEEN     FEW GRAM POSITIVE COCCI     IN PAIRS   Culture     Final    Value: ABUNDANT METHICILLIN RESISTANT STAPHYLOCOCCUS AUREUS     Note: RIFAMPIN AND GENTAMICIN SHOULD NOT BE USED AS SINGLE DRUGS FOR TREATMENT OF STAPH INFECTIONS. CRITICAL RESULT CALLED TO, READ BACK BY AND VERIFIED WITH: TERA D 01/11/12 AT 815 AM BY [facility]   Report Status 01/11/2012 FINAL   Final    Organism ID, Bacteria METHICILLIN RESISTANT STAPHYLOCOCCUS AUREUS   Final   CULTURE, BLOOD (ROUTINE X 2)     Status: Normal (Preliminary result)   Collection Time   01/08/12  7:35 PM      Component Value Range Status Comment   Specimen Description BLOOD LEFT HAND   Final    Special Requests BOTTLES DRAWN AEROBIC AND ANAEROBIC 5CC EACH   Final    Culture  Setup Time [id number]   Final    Culture     Final    Value:        BLOOD CULTURE RECEIVED NO GROWTH TO DATE CULTURE WILL BE HELD FOR 5 DAYS BEFORE ISSUING A FINAL NEGATIVE REPORT   Report Status PENDING   Incomplete   CULTURE, BLOOD (ROUTINE X 2)     Status: Normal (Preliminary result)   Collection Time   01/08/12  7:50 PM      Component Value Range Status Comment   Specimen Description BLOOD LEFT ARM   Final    Special Requests BOTTLES DRAWN AEROBIC AND ANAEROBIC [facility] EACH   Final    Culture  Setup Time [id number]   Final    Culture     Final    Value:        BLOOD CULTURE RECEIVED NO GROWTH TO DATE CULTURE WILL BE HELD FOR 5 DAYS BEFORE ISSUING A FINAL NEGATIVE REPORT   Report Status PENDING   Incomplete   MRSA PCR SCREENING     Status: Abnormal   Collection Time   01/08/12  8:52 PM      Component Value Range Status Comment   MRSA by PCR POSITIVE (*) NEGATIVE  Final     Studies/Results:  Dg Chest Port 1  View 01/12/2012   IMPRESSION:  Improved bibasilar aeration.   Dg Chest 2 View 01/08/2012   IMPRESSION:  Interval small bilateral pleural effusions and mild bibasilar atelectasis.    Mr Ankle Right  Wo Contrast 01/09/2012   IMPRESSION:   1.  Shallow skin ulcer overlying the lateral malleolus.  No evidence of underlying abscess.  2.  No evidence of osteomyelitis.  3.  Mild nonspecific subcutaneous edema may represent cellulitis.  4.  Scattered arthropathic changes, not advanced for age.  No acute osteochondral findings.  5.  No acute or significant tendon or ligament findings.    Dg  Chest Port 1 View 01/10/2012    IMPRESSION:  Cardiomegaly with mild interstitial edema, increased.  Small bilateral pleural effusions, left greater than right, with associated left basilar atelectasis.    Brief H and P: For complete details please refer to admission H and P, but in brief  This is a 78 year old, Caucasian female, who was admitted to [facility] in March for a stroke. She was discharged to skilled nursing facility and then subsequently went to her independent living facility a few days  Prior to this admission. According to the daughter, who accompanied her patient's psoriasis had been getting worse. They have an appointment to see a dermatologist at the [facility]. According to the patient and the daughter she had been finding it difficult to walk for the last week and a half because of pain in the right ankle. She had noticed increased redness and increased drainage from the wound in the right ankle. All the symptoms have gotten worse over the last one month since discharge from [facility]. I actually remember this patient from that admission. Patient did not have a draining wound at that time.  The morning of admission the pattient's daughter noticed that patient appeared to be wheezing. There was a "squeaking" sound coming from her chest. Patient felt a little short of  breath. She had dry cough. She felt like she was very tired and exhausted. Nurse was called. The nurse called the on-call physician and a chest x-ray was obtained. In the chest x-ray concern was raised about a pneumonia in the left lower lobe. There were also small pleural effusions bilaterally, raising concern about congestive heart failure. Because of these findings patient was sent over to the emergency department. She also mentioned some fullness in her chest, but denied any pain per se. Denied any chills, had a low-grade fever of 99.6. No nausea, vomiting, recently. She was mainly concerned about the pain in her right ankle. Pt admitted to medicine service.    Hospital Course:   Principal Problem:  *Cellulitis Active Problems:  Leukocytosis  Hypertension  Atrial fibrillation  Psoriasis  Chronic diastolic HF (heart failure)  HCAP (healthcare-associated pneumonia)  1 cellulitis of the right ankle. Xray as above.  This is superimposed on the psoriasis. MRI yields no evidence osteomyelitis or abcess. Pt given  Vancomycin for 6 days then switched to doxy which will continue 7 days after discharge. Also given Azactam for 4days. Venous dopplers prelim report yields no DVT or SVT in bilateral LE. Low albumin may be contributing as well. Pain improved gradually. Was also seen by wound RN. ABI suggest moderate disease in right LE. No need for vascular input at this time. Pain is localized in ankle. At time of discharge pain resolved. Wound dressing rt ankle without drainage. Will need follow up by wound specialist at facility.   #2 healthcare associated pneumonia: provided with02 support and nebs as needed.  Repeat xray on 01/12/12 yields improved areation.completed 6 days levaquin.  Blood cultures no growth to date. Swallow eval unremarkable.   #3 "Fullness" in the chest: No further episodes. It is probably related to the pneumonia and possible exacerbation of  CHF. EKG is nonischemic. Troponins are  negative.   #4 history of diastolic heart failure with likely acute exacerbation: The pleural effusions could suggest an acute exacerbation of the same. Volume status neg 8.2L for this hospitalization. Intravenous Lasix x 6 doses given. Chest xray improved so will resumed home po lasix. Wt at  discharge 61.5 kg. On admission wt 67.9.   #5 leukocytosis: This is chronic. Antibiotics as above. Afebrile, non-toxic appearing. Stable in range of 16-18. She was supposed to see a hematologist however, because of the stroke, that was put off. Will need OP follow up   #6 history of atrial fibrillation: Continue with her current medications. She is not on anticoagulation. On 4/28 she went into afib with mild RVR for about 2 hours and then converted back to NSR. 01/12/12 afib with mild RVR again. Increased BB and 01/12/12. At discharge SR    #7 history of hypertension: Initially ACE held.  SBP range 150-166. Continue Bidil andLisinopril at discharge.    #8 Psoriasis: Appears to have worsened over the last month or so. She is supposed to see a dermatologist at [facility] on Monday, but will have to reschedule that appointment. Continued home regimen during hospitalization.   #9 Malnutrition: Started Ensure. Appetite continues to Improve.   Time spent on Discharge: 40 minutes  Signed: Gwenyth Bender 01/14/2012, 10:58 AM  Pt was seen and examined at bedside. I have reviewed labs and vitals which are stable and pt is hemodynamically stable. I agree with the above's assessment and plan.  Pt is stable for discharge Debbora Presto [facility], pager #: [phone] Main office number: [phone]

## 2012-01-14 NOTE — Discharge Instructions (Addendum)
Cellulitis Cellulitis is an infection of the tissue under the skin. The infected area is usually red and tender. This is caused by germs. These germs enter the body through cuts or sores. This usually happens in the arms or lower legs. HOME CARE   Take your medicine as told. Finish it even if you start to feel better.   If the infection is on the arm or leg, keep it raised (elevated).   Use a warm cloth on the infected area several times a day.   See your doctor for a follow-up visit as told.  GET HELP RIGHT AWAY IF:   You are tired or confused.   You throw up (vomit).   You have watery poop (diarrhea).   You feel ill and have muscle aches.   You have a fever.  MAKE SURE YOU:   Understand these instructions.   Will watch your condition.   Will get help right away if you are not doing well or get worse.  Document Released: 02/16/2008 Document Revised: 08/19/2011 Document Reviewed: 08/01/2009 [facility] Patient Information 2012 [place], [place].MRSA Overview MRSA stands for methicillin-resistant Staphylococcus aureus. It is a type of bacteria that is resistant to some common antibiotics. It can cause infections in the skin and many other places in the body. Staphylococcus aureus, often called "staph," is a bacteria that normally lives on the skin or in the nose. Staph on the surface of the skin or in the nose does not cause problems. However, if the staph enters the body through a cut, wound, or break in the skin, an infection can happen. Up until recently, infections with the MRSA type of staph mainly occurred in hospitals and other healthcare settings. There are now increasing problems with MRSA infections in the community as well. Infections with MRSA may be very serious or even life-threatening. Most MRSA infections are acquired in one of two ways:  Healthcare-associated MRSA (HA-MRSA)   This can be acquired by people in any healthcare setting. MRSA can be a big problem for  hospitalized people, people in nursing homes, people in rehabilitation facilities, people with weakened immune systems, dialysis patients, and those who have had surgery.   Community-associated MRSA (CA-MRSA)   Community spread of MRSA is becoming more common. It is known to spread in crowded settings, in jails and prisons, and in situations where there is close skin-to-skin contact, such as during sporting events or in locker rooms. MRSA can be spread through shared items, such as children's toys, razors, towels, or sports equipment.  CAUSES  All staph, including MRSA, are normally harmless unless they enter the body through a scratch, cut, or wound, such as with surgery. All staph, including MRSA, can be spread from person-to-person by touching contaminated objects or through direct contact. SPECIAL GROUPS MRSA can present problems for special groups of people. Some of these groups include:  Breastfeeding women.   The most common problem is MRSA infection of the breast (mastitis). There is evidence that MRSA can be passed to an infant from infected breast milk. Your caregiver may recommend that you stop breastfeeding until the mastitis is under control.   If you are breastfeeding and have a MRSA infection in a place other than the breast, you may usually continue breastfeeding while under treatment. If taking antibiotics, ask your caregiver if it is safe to continue breastfeeding while taking your prescribed medicines.   Neonates (babies from birth to 74 month old) and infants (babies from 1 month to 1 year  old).   There is evidence that MRSA can be passed to a newborn at birth if the mother has MRSA on the skin, in or around the birth canal, or an infection in the uterus, cervix, or vagina. MRSA infection can have the same appearance as a normal newborn or infant rash or several other skin infections. This can make it hard to diagnose MRSA.   Immune compromised people.   If you have an immune  system problem, you may have a higher chance of developing a MRSA infection.   People after any type of surgery.   Staph in general, including MRSA, is the most common cause of infections occurring at the site of recent surgery.   People on long-term steroid medicines.   These kinds of medicines can lower your resistance to infection. This can increase your chance of getting MRSA.   People who have had frequent hospitalizations, live in nursing homes or other residential care facilities, have venous or urinary catheters, or have taken multiple courses of antibiotic therapy for any reason.  DIAGNOSIS  Diagnosis of MRSA is done by cultures of fluid samples that may come from:  Swabs taken from cuts or wounds in infected areas.   Nasal swabs.   Saliva or deep cough specimens from the lungs (sputum).   Urine.   Blood.  Many people are "colonized" with MRSA but have no signs of infection. This means that people carry the MRSA germ on their skin or in their nose and may never develop MRSA infection.  TREATMENT  Treatment varies and is based on how serious, how deep, or how extensive the infection is. For example:  Some skin infections, such as a small boil or abscess, may be treated by draining yellowish-white fluid (pus) from the site of the infection.   Deeper or more widespread soft tissue infections are usually treated with surgery to drain pus and with antibiotic medicine given by vein or by mouth. This may be recommended even if you are pregnant.   Serious infections may require a hospital stay.  If antibiotics are given, they may be needed for several weeks. PREVENTION  Because many people are colonized with staph, including MRSA, preventing the spread of the bacteria from person-to-person is most important. The best way to prevent the spread of bacteria and other germs is through proper hand washing or by using alcohol-based hand disinfectants. The following are other ways to help  prevent MRSA infection within the hospital and community settings.   Healthcare settings:   Strict hand washing or hand disinfection procedures need to be followed before and after touching every patient.   Patients infected with MRSA are placed in isolation to prevent the spread of the bacteria.   Healthcare workers need to wear disposable gowns and gloves when touching or caring for patients infected with MRSA. Visitors may also be asked to wear a gown and gloves.   Hospital surfaces need to be disinfected frequently.   Community settings:   NIKE frequently with soap and water for at least 15 seconds. Otherwise, use alcohol-based hand disinfectants when soap and water is not available.   Make sure people who live with you wash their hands often, too.   Do not share personal items. For example, avoid sharing razors and other personal hygiene items, towels, clothing, and athletic equipment.   Wash and dry your clothes and bedding at the warmest temperatures recommended on the labels.   Keep wounds covered. Pus from infected  sores may contain MRSA and other bacteria. Keep cuts and abrasions clean and covered with germ-free (sterile), dry bandages until they are healed.   If you have a wound that appears infected, ask your caregiver if a culture for MRSA and other bacteria should be done.   If you are breastfeeding, talk to your caregiver about MRSA. You may be asked to temporarily stop breastfeeding.  HOME CARE INSTRUCTIONS   Take your antibiotics as directed. Finish them even if you start to feel better.   Avoid close contact with those around you as much as possible. Do not use towels, razors, toothbrushes, bedding, or other items that will be used by others.   To fight the infection, follow your caregiver's instructions for wound care. Wash your hands before and after changing your bandages.   If you have an intravascular device, such as a catheter, make sure you know  how to care for it.   Be sure to tell any healthcare providers that you have MRSA so they are aware of your infection.  SEEK IMMEDIATE MEDICAL CARE IF:   The infection appears to be getting worse. Signs include:   Increased warmth, redness, or tenderness around the wound site.   A red line that extends from the infection site.   A dark color in the area around the infection.   Wound drainage that is tan, yellow, or green.   A bad smell coming from the wound.   You feel sick to your stomach (nauseous) and throw up (vomit) or cannot keep medicine down.   You have a fever.   Your baby is older than 3 months with a rectal temperature of 102 F (38.9 C) or higher.   Your baby is 77 months old or younger with a rectal temperature of 100.4 F (38 C) or higher.   You have difficulty breathing.  MAKE SURE YOU:   Understand these instructions.   Will watch your condition.   Will get help right away if you are not doing well or get worse.  Document Released: 08/30/2005 Document Revised: 08/19/2011 Document Reviewed: 12/02/2010 [facility] Patient Information 2012 [place], [place].

## 2012-01-14 NOTE — Progress Notes (Signed)
   CARE MANAGEMENT NOTE 01/14/2012  Patient:  Gallicchio,Junie A   Account Number:  [id number]  Date Initiated:  01/14/2012  Documentation initiated by:  Lanier Clam  Subjective/Objective Assessment:   ADMITTED W/CELLULITIS     Action/Plan:   FROM HOME   Anticipated DC Date:  01/14/2012   Anticipated DC Plan:  SKILLED NURSING FACILITY  In-house referral  Clinical Social Worker         Choice offered to / List presented to:             Status of service:  Completed, signed off Medicare Important Message given?   (If response is "NO", the following Medicare IM given date fields will be blank) Date Medicare IM given:   Date Additional Medicare IM given:    Discharge Disposition:  SKILLED NURSING FACILITY  Per UR Regulation:  Reviewed for med. necessity/level of care/duration of stay  If discussed at Long Length of Stay Meetings, dates discussed:    Comments:

## 2012-01-14 NOTE — Progress Notes (Addendum)
Patient cleared for discharge. [facility] accepted to [facility]. Packet copied and placed in [place].CSW called patient's daughter. She states that she will transport patient. CSW informed nurse of same.  Leontyne Manville C. Erasto Sleight MSW, LCSW [phone]

## 2012-01-15 LAB — CULTURE, BLOOD (ROUTINE X 2)
Culture  Setup Time: 201304280221
Culture: NO GROWTH

## 2012-01-17 ENCOUNTER — Encounter: Payer: Self-pay | Admitting: Vascular Surgery

## 2012-01-17 DIAGNOSIS — R5381 Other malaise: Secondary | ICD-10-CM

## 2012-01-17 HISTORY — DX: Other malaise: R53.81

## 2012-01-18 ENCOUNTER — Encounter: Payer: Medicare Other | Admitting: Vascular Surgery

## 2012-01-19 ENCOUNTER — Other Ambulatory Visit: Payer: Medicare Other | Admitting: Lab

## 2012-01-19 ENCOUNTER — Encounter: Payer: Medicare Other | Admitting: Oncology

## 2012-01-19 ENCOUNTER — Ambulatory Visit: Payer: Medicare Other

## 2012-01-19 ENCOUNTER — Telehealth: Payer: Self-pay | Admitting: *Deleted

## 2012-01-19 NOTE — Telephone Encounter (Signed)
Rec'd message from [facility] with Dr. Renne Crigler that patient has been discharged from hospital on 01/14/12. Patient in skilled nursing facility and can be reached on cell phone [phone]. We will attempt to schedule New Patient appt again.

## 2012-01-25 ENCOUNTER — Telehealth: Payer: Self-pay | Admitting: Oncology

## 2012-01-25 NOTE — Telephone Encounter (Signed)
lmonvm of the pt's home and cell phone regarding an appt with dr Cyndie Chime on 02/02/2012. Asked the pt to call me back to confirm

## 2012-01-26 DIAGNOSIS — E876 Hypokalemia: Secondary | ICD-10-CM

## 2012-01-26 HISTORY — DX: Hypokalemia: E87.6

## 2012-02-01 ENCOUNTER — Telehealth: Payer: Self-pay | Admitting: *Deleted

## 2012-02-01 NOTE — Telephone Encounter (Signed)
Message from Answering Service that pt. Called last evening to cancel the 5-55-13 appt.  Asked that we call her to reschedule.  She is a resident at [facility].  Called her cell number and spoke with her.  She asked the reason for this appt. & who referred her.  Asked how long the appointment will take.  Has an issue with transportation.  Will ask her son and if he is unable she will arrange Zenaida Niece transportation with [facility].  She also will have to make dinner arrangements.  Says she will keep the appt.  Reports an infected foot and has been on a lot of antibiotics.  Recommended use of valet parking if son brings her.

## 2012-02-02 ENCOUNTER — Ambulatory Visit (HOSPITAL_BASED_OUTPATIENT_CLINIC_OR_DEPARTMENT_OTHER): Payer: Medicare Other | Admitting: Oncology

## 2012-02-02 ENCOUNTER — Other Ambulatory Visit: Payer: Self-pay | Admitting: Oncology

## 2012-02-02 VITALS — BP 125/86 | HR 59 | Temp 97.3°F | Ht 65.0 in | Wt 139.0 lb

## 2012-02-02 DIAGNOSIS — D72829 Elevated white blood cell count, unspecified: Secondary | ICD-10-CM

## 2012-02-02 DIAGNOSIS — D649 Anemia, unspecified: Secondary | ICD-10-CM

## 2012-02-02 DIAGNOSIS — R599 Enlarged lymph nodes, unspecified: Secondary | ICD-10-CM

## 2012-02-02 DIAGNOSIS — D72820 Lymphocytosis (symptomatic): Secondary | ICD-10-CM

## 2012-02-02 LAB — CBC & DIFF AND RETIC
Basophils Absolute: 0.1 10*3/uL (ref 0.0–0.1)
HCT: 34.6 % — ABNORMAL LOW (ref 34.8–46.6)
HGB: 11.8 g/dL (ref 11.6–15.9)
Immature Retic Fract: 6.5 % (ref 1.60–10.00)
MCH: 31.4 pg (ref 25.1–34.0)
MONO#: 0.9 10*3/uL (ref 0.1–0.9)
NEUT%: 44.2 % (ref 38.4–76.8)
Retic Ct Abs: 40.98 10*3/uL (ref 33.70–90.70)
lymph#: 7.4 10*3/uL — ABNORMAL HIGH (ref 0.9–3.3)

## 2012-02-02 LAB — MORPHOLOGY

## 2012-02-02 NOTE — Progress Notes (Signed)
New Patient Hematology-Oncology Evaluation   Sara [name] [id number] August 12, 1919 78 y.o. 02/02/2012  CC: Dr. Merri Brunette   Reason for referral: Mild anemia and leukocytosis with absolute lymphocytosis in a elderly woman   HPI: Delightful lady who will be 78 years old next week. She has a history of atrial fibrillation, chronic congestive heart failure, hypertension, hyperlipidemia, and cerebrovascular disease with 2 strokes and a recent third cerebrovascular accident versus TIA requiring hospitalization on 12/07/2011. She is on chronic aspirin and Plavix.  She developed severe psoriasis in her 20s. She's been on a number of different agents including Humira which did not seem to be helping and which she stopped in January of this year. She has been on methotrexate 7.5 mg weekly for about 1-1/2 years. She is taking ultraviolet light treatments and these have helped. Lab data provided by her internist shows a normal CBC in April of 2009 with a hemoglobin of 14.7, white count 9500, platelets 322,000. No differential recorded. Consistent elevation of her white count not noted until February of 2012 when white count was 18,100, hemoglobin 12.6, platelets 336,000. CBC done on February 7 of this year prior to her going into the hospital with a stroke showed a white count of 19,500 with 34 neutrophils 57 lymphocytes, hemoglobin 12.7, platelets 274,000. CBC in our office today with hemoglobin 11.8, hematocrit 34.6, white count 15,500 with 44 neutrophils and 48 lymphocytes 5 monocytes 2 eosinophils and platelet count 217,000. This is right at the borderline of absolute lymphocytosis which in our lab is 50%. Or above. She is recuperating from the recent hospitalization but overall has no constitutional symptoms. She was reactive prior to the recent hospital stay. She lives independently and her own apartment. She is not getting any fevers, anorexia, weight loss, or night sweats. She has not noticed any swollen  glands. She has no history of hepatitis or yellow jaundice.   PMH: Past Medical History  Diagnosis Date  . Hypertension   . Atrial fibrillation   . Psoriasis   . Congestive heart failure (CHF)   . Basal cell carcinoma   . History of CVA (cerebrovascular accident)   . Coronary artery disease   . Myocardial infarction   . CHF (congestive heart failure)   . Stroke   . Shortness of breath   . H/O hiatal hernia   . Closed fracture of head of left humerus 5/12    Past Surgical History  Procedure Date  . Tonsillectomy   . Dilation and curettage of uterus   . Eye surgery   . Cataracts   . Vertebroplasty     Allergies: Allergies  Allergen Reactions  . Penicillins Other (See Comments)    unknown  . Triamterene Other (See Comments)    unknown  . Alendronate Sodium Rash    Medications:  (Not in a hospital admission)  Social History:   reports that she has never smoked. She has never used smokeless tobacco. She reports that she does not drink alcohol or use illicit drugs.  Family History: Family History  Problem Relation Age of Onset  . Stomach cancer Mother   . Pneumonia Father     Review of Systems: Constitutional symptoms: See above HEENT: No sore throat Respiratory: No cough or dyspnea Cardiovascular:  No chest pain or palpitations Gastrointestinal ROS: No abdominal pain or change in bowel habit Genito-Urinary ROS: No urinary tract symptoms Hematological and Lymphatic: See above Musculoskeletal: No muscle or bone pain Neurologic: No headache or change in vision  Dermatologic: See above-chronic psoriasis Remaining ROS negative.  Physical Exam: Blood pressure 125/86, pulse 59, temperature 97.3 F (36.3 C), temperature source Oral, height 5\' 5"  (1.651 m), weight 139 lb (63.05 kg). Wt Readings from Last 3 Encounters:  02/02/12 139 lb (63.05 kg)  01/14/12 135 lb 9.6 oz (61.508 kg)  12/10/11 140 lb 3.4 oz (63.6 kg)    General appearance: Thin pleasant  articulate Caucasian woman Head: Normal Neck: Full range of motion Lymph nodes: Single 3-4 cm lymph node palpable in the right axilla. No cervical supraclavicular or left axillary or inguinal adenopathy Breasts: Not examined Lungs: Clear to auscultation resonant to percussion Heart: Regular rhythm 1-2/6 systolic murmur Abdominal: Soft nontender no mass no organomegaly GU: Not examined Extremities: Trace pedal edema Neurologic: Mental status intact, cranial nerves intact, motor strength 5 over 5, pupils unequal right 1-2 mm greater than left, reactive to light and I could not get a good look at her fundi. Reflexes 1+ symmetric Skin: Diffuse erythematous rash on the trunk and arms scattered ecchymoses. Hematoma with in area of recent venipuncture on her left hand.    Lab Results: Lab Results  Component Value Date   WBC 15.5* 02/02/2012   HGB 11.8 02/02/2012   HCT 34.6* 02/02/2012   MCV 92.0 02/02/2012   PLT 217 02/02/2012     Chemistry      Component Value Date/Time   NA 138 01/14/2012 0430   K 4.1 01/14/2012 0430   CL 104 01/14/2012 0430   CO2 25 01/14/2012 0430   BUN 23 01/14/2012 0430   CREATININE 0.86 01/14/2012 0430      Component Value Date/Time   CALCIUM 9.1 01/14/2012 0430   ALKPHOS 46 01/09/2012 0242   AST 15 01/09/2012 0242   ALT 21 01/09/2012 0242   BILITOT 0.2* 01/09/2012 0242       Review of peripheral blood film:    Impression and Plan: Mild normochromic anemia, leukocytosis with borderline absolute lymphocytosis, and a single area of right axillary adenopathy in an elderly woman on chronic oral methotrexate treatment for psoriasis.  I am not sure how much of an inflammatory response one gets from generalized erythematous psoriasis and the current UV light treatments. I would expect if she had an inflammatory spot response that it would not result in lymphocytosis but may cause nonspecific leukocytosis. I suspect that she does have early chronic lymphocytic leukemia or a  well-differentiated lymphocytic lymphoma.  Recommendation: She is asymptomatic. Her white blood count has not changed in over 1 year of observation. She has a single area of asymptomatic lymphadenopathy. I think the most prudent course of action  is observation alone. This is actually the standard of care for stage 0 and stage I CLL at this time. I am going to have her come back again in 2 months. I would like to check flow cytometry on peripheral blood and get some ancillary studies such as a serum immunoglobulin levels.       Levert Feinstein, MD 02/02/2012, 6:16 PM

## 2012-02-03 ENCOUNTER — Telehealth: Payer: Self-pay | Admitting: Oncology

## 2012-02-03 NOTE — Progress Notes (Signed)
Addendum to office consultation:  Review of the peripheral blood film: Normochromic normocytic red cells. No polychromasia. Slight increase in lymphocytes. Primarily benign small round cells. There is a subpopulation of cells with "coffee been" nuclei and occasional cells with a "fried egg" appearance which can be seen with hairy cell leukemia (well-differentiated lymphocytic lymphoma) but there no cells with the typical cytoplasmic projections seen with hairy cell. Neutrophils and mature. Platelets are normal.

## 2012-02-03 NOTE — Telephone Encounter (Signed)
Tried to call pt, cannot leave message, mailed appt today for July 2013

## 2012-03-01 ENCOUNTER — Other Ambulatory Visit: Payer: Self-pay | Admitting: Internal Medicine

## 2012-03-01 ENCOUNTER — Ambulatory Visit
Admission: RE | Admit: 2012-03-01 | Discharge: 2012-03-01 | Disposition: A | Discharge status: Home / Self Care | Payer: Medicare Other | Source: Ambulatory Visit | Attending: Internal Medicine | Admitting: Internal Medicine

## 2012-03-01 DIAGNOSIS — R609 Edema, unspecified: Secondary | ICD-10-CM

## 2012-03-20 ENCOUNTER — Emergency Department (HOSPITAL_COMMUNITY): Payer: Medicare Other

## 2012-03-20 ENCOUNTER — Other Ambulatory Visit (HOSPITAL_COMMUNITY): Payer: Medicare Other

## 2012-03-20 ENCOUNTER — Encounter (HOSPITAL_COMMUNITY): Payer: Self-pay | Admitting: Emergency Medicine

## 2012-03-20 ENCOUNTER — Inpatient Hospital Stay (HOSPITAL_COMMUNITY)
Admission: EM | Admit: 2012-03-20 | Discharge: 2012-03-24 | DRG: 291 | Disposition: A | Discharge status: Skilled Nursing Facility | Payer: Medicare Other | Attending: Internal Medicine | Admitting: Internal Medicine

## 2012-03-20 DIAGNOSIS — I480 Paroxysmal atrial fibrillation: Secondary | ICD-10-CM | POA: Diagnosis present

## 2012-03-20 DIAGNOSIS — I509 Heart failure, unspecified: Secondary | ICD-10-CM

## 2012-03-20 DIAGNOSIS — I639 Cerebral infarction, unspecified: Secondary | ICD-10-CM | POA: Diagnosis present

## 2012-03-20 DIAGNOSIS — I5033 Acute on chronic diastolic (congestive) heart failure: Principal | ICD-10-CM

## 2012-03-20 DIAGNOSIS — I5032 Chronic diastolic (congestive) heart failure: Secondary | ICD-10-CM | POA: Diagnosis present

## 2012-03-20 DIAGNOSIS — Z8673 Personal history of transient ischemic attack (TIA), and cerebral infarction without residual deficits: Secondary | ICD-10-CM

## 2012-03-20 DIAGNOSIS — I5031 Acute diastolic (congestive) heart failure: Secondary | ICD-10-CM | POA: Diagnosis present

## 2012-03-20 DIAGNOSIS — I10 Essential (primary) hypertension: Secondary | ICD-10-CM

## 2012-03-20 DIAGNOSIS — J9600 Acute respiratory failure, unspecified whether with hypoxia or hypercapnia: Secondary | ICD-10-CM | POA: Diagnosis present

## 2012-03-20 DIAGNOSIS — I739 Peripheral vascular disease, unspecified: Secondary | ICD-10-CM | POA: Diagnosis present

## 2012-03-20 DIAGNOSIS — D72829 Elevated white blood cell count, unspecified: Secondary | ICD-10-CM | POA: Diagnosis present

## 2012-03-20 DIAGNOSIS — R531 Weakness: Secondary | ICD-10-CM

## 2012-03-20 DIAGNOSIS — R0602 Shortness of breath: Secondary | ICD-10-CM

## 2012-03-20 DIAGNOSIS — I4891 Unspecified atrial fibrillation: Secondary | ICD-10-CM

## 2012-03-20 DIAGNOSIS — G8190 Hemiplegia, unspecified affecting unspecified side: Secondary | ICD-10-CM

## 2012-03-20 DIAGNOSIS — J189 Pneumonia, unspecified organism: Secondary | ICD-10-CM

## 2012-03-20 LAB — URINALYSIS, ROUTINE W REFLEX MICROSCOPIC
Bilirubin Urine: NEGATIVE
Hgb urine dipstick: NEGATIVE
Specific Gravity, Urine: 1.014 (ref 1.005–1.030)
pH: 6.5 (ref 5.0–8.0)

## 2012-03-20 LAB — BASIC METABOLIC PANEL
BUN: 17 mg/dL (ref 6–23)
Chloride: 96 mEq/L (ref 96–112)
GFR calc Af Amer: 87 mL/min — ABNORMAL LOW (ref >90)
GFR calc non Af Amer: 75 mL/min — ABNORMAL LOW (ref >90)
Potassium: 4.4 mEq/L (ref 3.5–5.1)
Sodium: 133 mEq/L — ABNORMAL LOW (ref 135–145)

## 2012-03-20 LAB — BLOOD GAS, ARTERIAL
Acid-Base Excess: 1.9 mmol/L (ref 0.0–2.0)
Bicarbonate: 26.3 mEq/L — ABNORMAL HIGH (ref 20.0–24.0)
Delivery systems: POSITIVE
Expiratory PAP: 6
FIO2: 1 %
O2 Saturation: 99.6 %
Patient temperature: 98.6
TCO2: 24 mmol/L (ref 0–100)
pO2, Arterial: 288 mmHg — ABNORMAL HIGH (ref 80.0–100.0)

## 2012-03-20 LAB — TROPONIN I: Troponin I: <0.3 ng/mL (ref <0.30)

## 2012-03-20 LAB — CARDIAC PANEL(CRET KIN+CKTOT+MB+TROPI)
CK, MB: 2.2 ng/mL (ref 0.3–4.0)
Relative Index: INVALID (ref 0.0–2.5)
Relative Index: INVALID (ref 0.0–2.5)
Troponin I: <0.3 ng/mL (ref <0.30)
Troponin I: <0.3 ng/mL (ref <0.30)

## 2012-03-20 LAB — CBC
HCT: 36.8 % (ref 36.0–46.0)
RDW: 14.7 % (ref 11.5–15.5)
WBC: 23 10*3/uL — ABNORMAL HIGH (ref 4.0–10.5)

## 2012-03-20 LAB — TSH: TSH: 1.072 u[IU]/mL (ref 0.350–4.500)

## 2012-03-20 LAB — PRO B NATRIURETIC PEPTIDE: Pro B Natriuretic peptide (BNP): 3938 pg/mL — ABNORMAL HIGH (ref 0–450)

## 2012-03-20 MED ORDER — DEXTROSE 5 % IV SOLN
1.0000 g | Freq: Two times a day (BID) | INTRAVENOUS | Status: DC
  Administered 2012-03-20 – 2012-03-24 (×9): 1 g via INTRAVENOUS
  Filled 2012-03-20 (×10): qty 1

## 2012-03-20 MED ORDER — POTASSIUM CHLORIDE CRYS ER 20 MEQ PO TBCR
20.0000 meq | EXTENDED_RELEASE_TABLET | Freq: Every day | ORAL | Status: DC
  Administered 2012-03-22: 20 meq via ORAL
  Filled 2012-03-20: qty 1

## 2012-03-20 MED ORDER — DOCUSATE SODIUM 100 MG PO CAPS
100.0000 mg | ORAL_CAPSULE | Freq: Every day | ORAL | Status: DC
  Administered 2012-03-20 – 2012-03-24 (×5): 100 mg via ORAL
  Filled 2012-03-20 (×5): qty 1

## 2012-03-20 MED ORDER — VANCOMYCIN HCL IN DEXTROSE 1-5 GM/200ML-% IV SOLN
1000.0000 mg | Freq: Once | INTRAVENOUS | Status: AC
  Administered 2012-03-20: 1000 mg via INTRAVENOUS
  Filled 2012-03-20: qty 200

## 2012-03-20 MED ORDER — PANTOPRAZOLE SODIUM 40 MG PO TBEC
40.0000 mg | DELAYED_RELEASE_TABLET | Freq: Every day | ORAL | Status: DC
  Administered 2012-03-20 – 2012-03-24 (×5): 40 mg via ORAL
  Filled 2012-03-20 (×5): qty 1

## 2012-03-20 MED ORDER — CARVEDILOL 25 MG PO TABS
25.0000 mg | ORAL_TABLET | Freq: Two times a day (BID) | ORAL | Status: DC
  Administered 2012-03-20: 25 mg via ORAL
  Filled 2012-03-20 (×4): qty 1

## 2012-03-20 MED ORDER — ATORVASTATIN CALCIUM 40 MG PO TABS
40.0000 mg | ORAL_TABLET | Freq: Every day | ORAL | Status: DC
  Administered 2012-03-20: 40 mg via ORAL
  Filled 2012-03-20 (×2): qty 1

## 2012-03-20 MED ORDER — HYDROCERIN EX CREA
TOPICAL_CREAM | Freq: Two times a day (BID) | CUTANEOUS | Status: DC | PRN
  Administered 2012-03-21: 1 via TOPICAL
  Administered 2012-03-23: 22:00:00 via TOPICAL
  Filled 2012-03-20: qty 113

## 2012-03-20 MED ORDER — DILTIAZEM HCL ER COATED BEADS 120 MG PO CP24
120.0000 mg | ORAL_CAPSULE | Freq: Every day | ORAL | Status: DC
  Administered 2012-03-20 – 2012-03-24 (×5): 120 mg via ORAL
  Filled 2012-03-20 (×5): qty 1

## 2012-03-20 MED ORDER — OXYCODONE HCL 5 MG PO TABS: 10.0000 mg | ORAL_TABLET | ORAL | Status: DC | PRN

## 2012-03-20 MED ORDER — FUROSEMIDE 10 MG/ML IJ SOLN
60.0000 mg | Freq: Once | INTRAMUSCULAR | Status: AC
  Administered 2012-03-20: 60 mg via INTRAVENOUS
  Filled 2012-03-20: qty 8

## 2012-03-20 MED ORDER — CO Q 10 100 MG PO CAPS: 1.0000 | ORAL_CAPSULE | Freq: Every day | ORAL | Status: DC

## 2012-03-20 MED ORDER — ENOXAPARIN SODIUM 40 MG/0.4ML ~~LOC~~ SOLN
40.0000 mg | SUBCUTANEOUS | Status: DC
  Administered 2012-03-20 – 2012-03-24 (×5): 40 mg via SUBCUTANEOUS
  Filled 2012-03-20 (×5): qty 0.4

## 2012-03-20 MED ORDER — ACETAMINOPHEN 325 MG PO TABS: 650.0000 mg | ORAL_TABLET | ORAL | Status: DC | PRN

## 2012-03-20 MED ORDER — ONDANSETRON HCL 4 MG/2ML IJ SOLN: 4.0000 mg | Freq: Four times a day (QID) | INTRAMUSCULAR | Status: DC | PRN

## 2012-03-20 MED ORDER — OMEGA-3-ACID ETHYL ESTERS 1 G PO CAPS
1.0000 g | ORAL_CAPSULE | Freq: Every day | ORAL | Status: DC
  Administered 2012-03-20 – 2012-03-24 (×5): 1 g via ORAL
  Filled 2012-03-20 (×5): qty 1

## 2012-03-20 MED ORDER — SPIRONOLACTONE 12.5 MG HALF TABLET
12.5000 mg | ORAL_TABLET | Freq: Every day | ORAL | Status: DC
  Administered 2012-03-20 – 2012-03-24 (×5): 12.5 mg via ORAL
  Filled 2012-03-20 (×5): qty 1

## 2012-03-20 MED ORDER — OXYCODONE HCL 5 MG PO TABS: 5.0000 mg | ORAL_TABLET | ORAL | Status: DC

## 2012-03-20 MED ORDER — CHLORHEXIDINE GLUCONATE CLOTH 2 % EX PADS: 6.0000 | MEDICATED_PAD | Freq: Every day | CUTANEOUS | Status: DC

## 2012-03-20 MED ORDER — POLYSACCHARIDE IRON COMPLEX 150 MG PO CAPS
150.0000 mg | ORAL_CAPSULE | Freq: Every day | ORAL | Status: DC
  Administered 2012-03-21 – 2012-03-24 (×4): 150 mg via ORAL
  Filled 2012-03-20 (×6): qty 1

## 2012-03-20 MED ORDER — METHYLPREDNISOLONE SODIUM SUCC 125 MG IJ SOLR
INTRAMUSCULAR | Status: AC
  Administered 2012-03-20: 03:00:00
  Filled 2012-03-20: qty 2

## 2012-03-20 MED ORDER — FOLIC ACID 1 MG PO TABS
1.0000 mg | ORAL_TABLET | Freq: Every day | ORAL | Status: DC
  Administered 2012-03-20 – 2012-03-24 (×5): 1 mg via ORAL
  Filled 2012-03-20 (×5): qty 1

## 2012-03-20 MED ORDER — MAGNESIUM GLUCONATE 500 MG PO TABS
500.0000 mg | ORAL_TABLET | Freq: Every day | ORAL | Status: DC
  Administered 2012-03-20 – 2012-03-24 (×5): 500 mg via ORAL
  Filled 2012-03-20 (×5): qty 1

## 2012-03-20 MED ORDER — SODIUM CHLORIDE 0.9 % IV SOLN: 250.0000 mL | INTRAVENOUS | Status: DC | PRN

## 2012-03-20 MED ORDER — IPRATROPIUM BROMIDE 0.02 % IN SOLN
RESPIRATORY_TRACT | Status: AC
  Administered 2012-03-20: 03:00:00
  Filled 2012-03-20: qty 2.5

## 2012-03-20 MED ORDER — ASPIRIN EC 81 MG PO TBEC
81.0000 mg | DELAYED_RELEASE_TABLET | Freq: Every day | ORAL | Status: DC
  Administered 2012-03-20 – 2012-03-24 (×5): 81 mg via ORAL
  Filled 2012-03-20 (×5): qty 1

## 2012-03-20 MED ORDER — ISOSORB DINITRATE-HYDRALAZINE 20-37.5 MG PO TABS
0.5000 | ORAL_TABLET | Freq: Two times a day (BID) | ORAL | Status: DC
  Filled 2012-03-20 (×2): qty 0.5

## 2012-03-20 MED ORDER — HYLATOPIC EX FOAM: 1.0000 "application " | Freq: Two times a day (BID) | CUTANEOUS | Status: DC | PRN

## 2012-03-20 MED ORDER — NITROGLYCERIN IN D5W 200-5 MCG/ML-% IV SOLN: 2.0000 ug/min | INTRAVENOUS | Status: DC

## 2012-03-20 MED ORDER — MUPIROCIN 2 % EX OINT
1.0000 "application " | TOPICAL_OINTMENT | Freq: Two times a day (BID) | CUTANEOUS | Status: DC
  Administered 2012-03-20 – 2012-03-24 (×9): 1 via NASAL
  Filled 2012-03-20: qty 22

## 2012-03-20 MED ORDER — ISOSORB DINITRATE-HYDRALAZINE 20-37.5 MG PO TABS
1.0000 | ORAL_TABLET | Freq: Two times a day (BID) | ORAL | Status: DC
  Administered 2012-03-20 (×2): 1 via ORAL
  Filled 2012-03-20 (×4): qty 1

## 2012-03-20 MED ORDER — OXYCODONE HCL 5 MG PO TABS
5.0000 mg | ORAL_TABLET | ORAL | Status: DC | PRN
  Administered 2012-03-21 – 2012-03-22 (×2): 5 mg via ORAL
  Filled 2012-03-20 (×3): qty 1

## 2012-03-20 MED ORDER — VITAMIN C 500 MG PO TABS
500.0000 mg | ORAL_TABLET | Freq: Every day | ORAL | Status: DC
  Administered 2012-03-20 – 2012-03-24 (×5): 500 mg via ORAL
  Filled 2012-03-20 (×5): qty 1

## 2012-03-20 MED ORDER — CLOPIDOGREL BISULFATE 75 MG PO TABS
75.0000 mg | ORAL_TABLET | Freq: Every day | ORAL | Status: DC
  Administered 2012-03-20 – 2012-03-24 (×5): 75 mg via ORAL
  Filled 2012-03-20 (×5): qty 1

## 2012-03-20 MED ORDER — ENSURE COMPLETE PO LIQD
237.0000 mL | Freq: Three times a day (TID) | ORAL | Status: DC
  Administered 2012-03-21 – 2012-03-24 (×5): 237 mL via ORAL

## 2012-03-20 MED ORDER — POTASSIUM CHLORIDE CRYS ER 20 MEQ PO TBCR
20.0000 meq | EXTENDED_RELEASE_TABLET | Freq: Two times a day (BID) | ORAL | Status: AC
  Administered 2012-03-20 – 2012-03-21 (×4): 20 meq via ORAL
  Filled 2012-03-20 (×4): qty 1

## 2012-03-20 MED ORDER — TRIAMCINOLONE ACETONIDE 0.1 % EX OINT
1.0000 "application " | TOPICAL_OINTMENT | Freq: Two times a day (BID) | CUTANEOUS | Status: DC
  Administered 2012-03-20 – 2012-03-24 (×6): 1 via TOPICAL
  Filled 2012-03-20: qty 15

## 2012-03-20 MED ORDER — ALPRAZOLAM 0.25 MG PO TABS
0.2500 mg | ORAL_TABLET | Freq: Two times a day (BID) | ORAL | Status: DC | PRN
  Administered 2012-03-20: 0.25 mg via ORAL
  Filled 2012-03-20: qty 1

## 2012-03-20 MED ORDER — FUROSEMIDE 10 MG/ML IJ SOLN
40.0000 mg | Freq: Two times a day (BID) | INTRAMUSCULAR | Status: AC
  Administered 2012-03-20 – 2012-03-21 (×3): 40 mg via INTRAVENOUS
  Filled 2012-03-20 (×3): qty 4

## 2012-03-20 MED ORDER — METHOTREXATE 2.5 MG PO TABS: 7.5000 mg | ORAL_TABLET | ORAL | Status: DC

## 2012-03-20 MED ORDER — SODIUM CHLORIDE 0.9 % IJ SOLN: 3.0000 mL | INTRAMUSCULAR | Status: DC | PRN

## 2012-03-20 MED ORDER — ACETAMINOPHEN 500 MG PO TABS: 500.0000 mg | ORAL_TABLET | ORAL | Status: DC | PRN

## 2012-03-20 MED ORDER — NITROGLYCERIN 0.4 MG SL SUBL: 0.4000 mg | SUBLINGUAL_TABLET | SUBLINGUAL | Status: DC | PRN

## 2012-03-20 MED ORDER — POTASSIUM CHLORIDE CRYS ER 20 MEQ PO TBCR
20.0000 meq | EXTENDED_RELEASE_TABLET | Freq: Every day | ORAL | Status: DC
  Filled 2012-03-20: qty 1

## 2012-03-20 MED ORDER — SODIUM CHLORIDE 0.9 % IJ SOLN
3.0000 mL | Freq: Two times a day (BID) | INTRAMUSCULAR | Status: DC
  Administered 2012-03-20 – 2012-03-24 (×6): 3 mL via INTRAVENOUS

## 2012-03-20 MED ORDER — NITROGLYCERIN IN D5W 200-5 MCG/ML-% IV SOLN
2.0000 ug/min | Freq: Once | INTRAVENOUS | Status: AC
  Administered 2012-03-20: 5 ug/min via INTRAVENOUS
  Filled 2012-03-20: qty 250

## 2012-03-20 MED ORDER — LISINOPRIL 20 MG PO TABS
20.0000 mg | ORAL_TABLET | Freq: Every day | ORAL | Status: DC
  Administered 2012-03-20: 20 mg via ORAL
  Filled 2012-03-20 (×2): qty 1

## 2012-03-20 MED ORDER — MAGNESIUM 30 MG PO TABS: 30.0000 mg | ORAL_TABLET | Freq: Every day | ORAL | Status: DC

## 2012-03-20 NOTE — Progress Notes (Signed)
PHARMACIST - PHYSICIAN ORDER COMMUNICATION  CONCERNING: P&T Medication Policy on Herbal Medications  DESCRIPTION:  This patient's order for:  CoQ  has been noted.  This product(s) is classified as an "herbal" or natural product. Due to a lack of definitive safety studies or FDA approval, nonstandard manufacturing practices, plus the potential risk of unknown drug-drug interactions while on inpatient medications, the Pharmacy and Therapeutics Committee does not permit the use of "herbal" or natural products of this type within [facility].   ACTION TAKEN: The pharmacy department is unable to verify this order at this time and your patient has been informed of this safety policy. Please reevaluate patient's clinical condition at discharge and address if the herbal or natural product(s) should be resumed at that time.  Thank You

## 2012-03-20 NOTE — ED Notes (Signed)
Repeat EKG done, given to MD. RT at bedside for ABG

## 2012-03-20 NOTE — ED Provider Notes (Signed)
History     CSN: [id number]  Arrival date & time 03/20/12  0221   First MD Initiated Contact with Patient 03/20/12 [phone]      Chief Complaint  Patient presents with  . Shortness of Breath    The history is provided by the patient and the EMS personnel. History Limited By: Level V caveat: Respiratory distress.   patient reports 2-3 days of worsening shortness of breath.  She has a history of congestive heart failure.  EMS was contacted and the patient has not have oxygen saturations in the low 80s on arrival.  She does not wear oxygen at home.  She was started on a continuous nebulized treatment right emergency department with no improvement in her breathing.  The patient reports significant shortness of breath.  She denies chest pain.  She's had no fevers or chills or productive cough.  She denies orthopnea.  She is unable to provide additional history secondary to increased work of breathing.   Past Medical History  Diagnosis Date  . Hypertension   . Atrial fibrillation   . Psoriasis   . Congestive heart failure (CHF)   . Basal cell carcinoma   . History of CVA (cerebrovascular accident)   . Coronary artery disease   . Myocardial infarction   . CHF (congestive heart failure)   . Stroke   . Shortness of breath   . H/O hiatal hernia   . Closed fracture of head of left humerus 5/12    Past Surgical History  Procedure Date  . Tonsillectomy   . Dilation and curettage of uterus   . Eye surgery   . Cataracts   . Vertebroplasty     Family History  Problem Relation Age of Onset  . Stomach cancer Mother   . Pneumonia Father     History  Substance Use Topics  . Smoking status: Never Smoker   . Smokeless tobacco: Never Used  . Alcohol Use: No    OB History    Grav Para Term Preterm Abortions TAB SAB Ect Mult Living                  Review of Systems  Unable to perform ROS   Allergies  Penicillins; Triamterene; and Alendronate sodium  Home Medications   Current  Outpatient Rx  Name Route Sig Dispense Refill  . ACETAMINOPHEN 500 MG PO TABS Oral Take 500 mg by mouth every 4 (four) hours as needed. Pain.    . ASPIRIN EC 81 MG PO TBEC Oral Take 162 mg by mouth daily.    Marland Kitchen CARVEDILOL 25 MG PO TABS Oral Take 1 tablet (25 mg total) by mouth 2 (two) times daily with a meal.    . CLOBETASOL PROPIONATE 0.05 % EX SOLN Topical Apply 1 application topically 2 (two) times daily. Apply to affected areas for psoriasis.    Marland Kitchen CLOPIDOGREL BISULFATE 75 MG PO TABS Oral Take 75 mg by mouth daily.    . CO Q 10 100 MG PO CAPS Oral Take 1 capsule by mouth daily.    Marland Kitchen DILTIAZEM HCL ER COATED BEADS 120 MG PO CP24 Oral Take 120 mg by mouth daily.    . STOOL SOFTENER PO Oral Take 1 capsule by mouth daily.    Marland Kitchen HYLATOPIC EX FOAM Apply externally Apply 1 application topically 2 (two) times daily as needed. Psoriasis.    Marland Kitchen ENSURE COMPLETE PO LIQD Oral Take 237 mLs by mouth 3 (three) times daily between meals.    Marland Kitchen  FOLIC ACID 1 MG PO TABS Oral Take 1 mg by mouth daily.    . FUROSEMIDE 40 MG PO TABS Oral Take 40 mg by mouth every morning.    Marland Kitchen POLYSACCHARIDE IRON COMPLEX 150 MG PO CAPS Oral Take 1 capsule (150 mg total) by mouth daily with breakfast.    . ISOSORB DINITRATE-HYDRALAZINE 20-37.5 MG PO TABS Oral Take 0.5 tablets by mouth 2 (two) times daily.    Marland Kitchen LISINOPRIL 40 MG PO TABS Oral Take 20 mg by mouth daily.    Marland Kitchen MAGNESIUM PO Oral Take 1 tablet by mouth daily. 500mg     . MENTHOL 3 MG MT LOZG Oral Take 1 lozenge (3 mg total) by mouth as needed. 100 tablet   . METHOTREXATE SODIUM 2.5 MG PO TABS Oral Take 7.5 mg by mouth once a week. She takes on Saturdays.    Marland Kitchen FISH OIL 1000 MG PO CAPS Oral Take 2,000 mg by mouth daily.    . OXYCODONE HCL 5 MG PO TABS Oral Take 1-2 tablets (5-10 mg total) by mouth See admin instructions. Take 5mg  by mouth every 4 hours as needed for mild pain. Take 10mg  by mouth every 4 hours as needed for severe pain. 30 tablet 0  . PANTOPRAZOLE SODIUM 40 MG PO  TBEC Oral Take 40 mg by mouth daily.    Marland Kitchen POTASSIUM CHLORIDE CRYS ER 20 MEQ PO TBCR Oral Take 20 mEq by mouth daily.    Marland Kitchen ROSUVASTATIN CALCIUM 20 MG PO TABS Oral Take 10 mg by mouth every other day.    . TRIAMCINOLONE ACETONIDE 0.1 % EX OINT Topical Apply 1 application topically 2 (two) times daily. To legs and feet for psoriasis.    Marland Kitchen VITAMIN C 500 MG PO TABS Oral Take 500 mg by mouth daily.      SpO2 92%  Physical Exam  Nursing note and vitals reviewed. Constitutional: She is oriented to person, place, and time. She appears well-developed and well-nourished. No distress.       Sitting upright in respiratory distress  HENT:  Head: Normocephalic and atraumatic.  Eyes: EOM are normal.  Neck: Normal range of motion.  Cardiovascular: Regular rhythm and normal heart sounds.        Tachycardic  Pulmonary/Chest: She is in respiratory distress. She has wheezes. She has rales. She exhibits no tenderness.  Abdominal: Soft. She exhibits no distension. There is no tenderness.  Musculoskeletal: Normal range of motion.  Neurological: She is alert and oriented to person, place, and time.  Skin: Skin is warm and dry.  Psychiatric: She has a normal mood and affect. Judgment normal.    ED Course  Procedures (including critical care time)  Date: 03/20/2012  Rate: 86  Rhythm: normal sinus rhythm  QRS Axis: normal  Intervals: normal  ST/T Wave abnormalities: nonspecific ST and T wave  changes  Conduction Disutrbances: none  Narrative Interpretation:   Old EKG Reviewed: no prior ecg   CRITICAL CARE Performed by: Lyanne Co Total critical care time: 35 Critical care time was exclusive of separately billable procedures and treating other patients. Critical care was necessary to treat or prevent imminent or life-threatening deterioration. Critical care was time spent personally by me on the following activities: development of treatment plan with patient and/or surrogate as well as nursing,  discussions with consultants, evaluation of patient's response to treatment, examination of patient, obtaining history from patient or surrogate, ordering and performing treatments and interventions, ordering and review of laboratory studies, ordering and  review of radiographic studies, pulse oximetry and re-evaluation of patient's condition.   Labs Reviewed  CBC - Abnormal; Notable for the following:    WBC 23.0 (*)     All other components within normal limits  BASIC METABOLIC PANEL - Abnormal; Notable for the following:    Sodium 133 (*)     Glucose, Bld 218 (*)     GFR calc non Af Amer 75 (*)     GFR calc Af Amer 87 (*)     All other components within normal limits  PRO B NATRIURETIC PEPTIDE - Abnormal; Notable for the following:    Pro B Natriuretic peptide (BNP) 3938.0 (*)     All other components within normal limits  BLOOD GAS, ARTERIAL - Abnormal; Notable for the following:    pO2, Arterial 288.0 (*)     Bicarbonate 26.3 (*)     All other components within normal limits  TROPONIN I  CULTURE, BLOOD (ROUTINE X 2)  CULTURE, BLOOD (ROUTINE X 2)   Dg Chest Portable 1 View  03/20/2012  *RADIOLOGY REPORT*  Clinical Data: Shortness of breath and weakness.  PORTABLE CHEST - 1 VIEW  Comparison: Chest radiograph performed 01/12/2012  Findings: The lungs are well-aerated.  Vascular congestion is noted, with bilateral central and bibasilar airspace opacities, concerning for pulmonary edema.  Superimposed pneumonia is a concern.  There is no evidence of pleural effusion or pneumothorax.  The cardiomediastinal silhouette is mildly enlarged; calcification is noted within the aortic arch.  No acute osseous abnormalities are seen.  There is stable likely chronic deformity of the left humeral neck.  IMPRESSION:  1.  Vascular congestion and mild cardiomegaly, with bilateral central and bibasilar airspace opacities, concerning for pulmonary edema.  Superimposed pneumonia is a concern. 2.  Stable likely  chronic deformity of the left humeral neck.  Original Report Authenticated By: Tonia Ghent, M.D.    I personally reviewed the imaging tests through PACS system  I reviewed available ER/hospitalization records thought the EMR   1. HCAP (healthcare-associated pneumonia)   2. CHF (congestive heart failure)       MDM  I suspect this is a CHF exacerbation.  The patient is a DO NOT RESUSCITATE.  She will be placed on BiPAP at this time.  The patient restarted on nitroglycerin drip for afterload reduction. ASA now  5:16 AM The pt feels much better at this time. Was weaned off of bipap. Now on 3 liters. She looks much better. Will tx for HCAP with vanc and cefepime (pcn allergy). Wbc 23,000. IV lasix given  [facility]ad to admit. Looks stable for telemetry at this time      Lyanne Co, MD 03/20/12 [phone]

## 2012-03-20 NOTE — ED Notes (Signed)
Report given to floor, they ask we wait until after shift change to bring pt up. Report given.

## 2012-03-20 NOTE — ED Notes (Signed)
Nitro gtt started, pt states she believes she is starting to feel better on BIPAP

## 2012-03-20 NOTE — Consult Note (Signed)
Reason for Consult: SOB  Requesting Physician: Dr Janee Morn  HPI: This is a 78 y.o. 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 She has a history of falls and unsteady gait and was not felt to be a Coumadin candidate. She was in SNF getting rehab after her stroke in March when she developed increasing SOB. She was admitted through the ER 03/20/12 with SOB and HTN. She denies chest pain or tachycardia. EKG showed NSR. BNP 3800. She was treated with IV NTG and BiPap with improvement.   PMHx:  Past Medical History  Diagnosis Date  . Hypertension   . Atrial fibrillation   . Psoriasis   . Congestive heart failure (CHF)   . Basal cell carcinoma   . History of CVA (cerebrovascular accident)   . Coronary artery disease   . Myocardial infarction   . CHF (congestive heart failure)   . Stroke   . Shortness of breath   . H/O hiatal hernia   . Closed fracture of head of left humerus 5/12   Past Surgical History  Procedure Date  . Tonsillectomy   . Dilation and curettage of uterus   . Eye surgery   . Cataracts   . Vertebroplasty     FAMHx: Family History  Problem Relation Age of Onset  . Stomach cancer Mother   . Pneumonia Father     SOCHx:  reports that she has never smoked. She has never used smokeless tobacco. She reports that she does not drink alcohol or use illicit drugs.  ALLERGIES: Allergies  Allergen Reactions  . Penicillins Other (See Comments)    unknown  . Triamterene Other (See Comments)    unknown  . Alendronate Sodium Rash    ROS: Recent fall at [facility]  HOME MEDICATIONS: Prescriptions prior to admission  Medication Sig Dispense Refill  . acetaminophen (TYLENOL) 500 MG tablet Take 500 mg by mouth every 4 (four) hours as needed. Pain.      Marland Kitchen aspirin EC 81 MG tablet Take 81  mg by mouth daily.       . carvedilol (COREG) 25 MG tablet Take 1 tablet (25 mg total) by mouth 2 (two) times daily with a meal.      . clopidogrel (PLAVIX) 75 MG tablet Take 75 mg by mouth daily.      . Coenzyme Q10 (CO Q 10) 100 MG CAPS Take 1 capsule by mouth daily.      Marland Kitchen diltiazem (CARDIZEM CD) 120 MG 24 hr capsule Take 120 mg by mouth daily.      Tery Sanfilippo Calcium (STOOL SOFTENER PO) Take 1 capsule by mouth daily.      . Emollient (HYLATOPIC) FOAM Apply 1 application topically 2 (two) times daily as needed. Psoriasis.      . feeding supplement (ENSURE COMPLETE) LIQD Take 237 mLs by mouth 3 (three) times daily between meals.      . folic acid (FOLVITE) 1 MG tablet Take 1 mg by mouth daily.      . furosemide (LASIX) 40 MG tablet Take 40 mg by mouth every morning.      . iron polysaccharides (NIFEREX) 150 MG capsule Take 1 capsule (150 mg total) by mouth daily with breakfast.      . isosorbide-hydrALAZINE (BIDIL) 20-37.5 MG per tablet  Take 0.5 tablets by mouth 2 (two) times daily.      Marland Kitchen lisinopril (PRINIVIL,ZESTRIL) 40 MG tablet Take 20 mg by mouth daily.      Marland Kitchen MAGNESIUM PO Take 1 tablet by mouth daily. 500mg       . methotrexate 2.5 MG tablet Take 7.5 mg by mouth once a week. She takes on Saturdays.      . Omega-3 Fatty Acids (FISH OIL) 1000 MG CAPS Take 2,000 mg by mouth daily.      Marland Kitchen oxyCODONE (OXY IR/ROXICODONE) 5 MG immediate release tablet Take 1-2 tablets (5-10 mg total) by mouth See admin instructions. Take 5mg  by mouth every 4 hours as needed for mild pain. Take 10mg  by mouth every 4 hours as needed for severe pain.  30 tablet  0  . pantoprazole (PROTONIX) 40 MG tablet Take 40 mg by mouth daily.      . potassium chloride SA ([name]-DUR,KLOR-CON) 20 MEQ tablet Take 20 mEq by mouth daily.      . rosuvastatin (CRESTOR) 20 MG tablet Take 10 mg by mouth every other day.      . triamcinolone ointment (KENALOG) 0.1 % Apply 1 application topically 2 (two) times daily. To legs and feet for  psoriasis.      Marland Kitchen vitamin C (ASCORBIC ACID) 500 MG tablet Take 500 mg by mouth daily.        HOSPITAL MEDICATIONS: I have reviewed the patient's current medications.    Marland Kitchen aspirin EC  81 mg Oral Daily  . atorvastatin  40 mg Oral q1800  . carvedilol  25 mg Oral BID WC  . ceFEPime (MAXIPIME) IV  1 g Intravenous Q12H  . Chlorhexidine Gluconate Cloth  6 each Topical Q0600  . clopidogrel  75 mg Oral Daily  . diltiazem  120 mg Oral Daily  . docusate sodium  100 mg Oral Daily  . enoxaparin (LOVENOX) injection  40 mg Subcutaneous Q24H  . feeding supplement  237 mL Oral TID BM  . folic acid  1 mg Oral Daily  . furosemide  40 mg Intravenous Q12H  . furosemide  60 mg Intravenous Once  . ipratropium      . iron polysaccharides  150 mg Oral Q breakfast  . isosorbide-hydrALAZINE  1 tablet Oral BID  . lisinopril  20 mg Oral Daily  . magnesium gluconate  500 mg Oral Daily  . methotrexate  7.5 mg Oral Weekly  . methylPREDNISolone sodium succinate      . mupirocin ointment  1 application Nasal BID  . nitroGLYCERIN  2-200 mcg/min Intravenous Once  . omega-3 acid ethyl esters  1 g Oral Daily  . pantoprazole  40 mg Oral Q1200  . potassium chloride  20 mEq Oral BID  . potassium chloride SA  20 mEq Oral Daily  . sodium chloride  3 mL Intravenous Q12H  . triamcinolone ointment  1 application Topical BID  . vancomycin  1,000 mg Intravenous Once  . vitamin C  500 mg Oral Daily  . DISCONTD: Co Q 10  1 capsule Oral Daily  . DISCONTD: isosorbide-hydrALAZINE  0.5 tablet Oral BID  . DISCONTD: magnesium  30 mg Oral Daily  . DISCONTD: oxyCODONE  5-10 mg Oral See admin instructions  . DISCONTD: potassium chloride SA  20 mEq Oral Daily    VITALS: Blood pressure 168/80, pulse 78, temperature 98 F (36.7 C), temperature source Oral, resp. rate 16, height 5\' 6"  (1.676 m), weight 64.8 kg (142 lb 13.7 oz), SpO2  98.00%.  PHYSICAL EXAM: General appearance: alert, cooperative, no distress and elderly  frail Neck: bilat carotid bruits Lungs: clear to auscultation bilaterally Heart: regular rate and rhythm and 2/6 systolic murmur Abdomen: soft, not distended Extremities: multiple areas of skin tears and bruising. Lower extremities without edema Pulses: diminnished Skin: thin skin with multiple skin tears Neurologic: Grossly normal  LABS: Results for orders placed during the hospital encounter of 03/20/12 (from the past 48 hour(s))  CBC     Status: Abnormal   Collection Time   03/20/12  2:30 AM      Component Value Range Comment   WBC 23.0 (*) 4.0 - 10.5 [name]/uL    RBC 3.87  3.87 - 5.11 MIL/uL    Hemoglobin 12.4  12.0 - 15.0 g/dL    HCT 62.9  52.8 - 41.3 %    MCV 95.1  78.0 - 100.0 fL    MCH 32.0  26.0 - 34.0 pg    MCHC 33.7  30.0 - 36.0 g/dL    RDW 24.4  01.0 - 27.2 %    Platelets 272  150 - 400 [name]/uL   BASIC METABOLIC PANEL     Status: Abnormal   Collection Time   03/20/12  2:30 AM      Component Value Range Comment   Sodium 133 (*) 135 - 145 mEq/L    Potassium 4.4  3.5 - 5.1 mEq/L    Chloride 96  96 - 112 mEq/L    CO2 27  19 - 32 mEq/L    Glucose, Bld 218 (*) 70 - 99 mg/dL    BUN 17  6 - 23 mg/dL    Creatinine, Ser 5.36  0.50 - 1.10 mg/dL    Calcium 9.0  8.4 - 64.4 mg/dL    GFR calc non Af Amer 75 (*) >90 mL/min    GFR calc Af Amer 87 (*) >90 mL/min   TROPONIN I     Status: Normal   Collection Time   03/20/12  2:30 AM      Component Value Range Comment   Troponin I <0.30  <0.30 ng/mL   PRO B NATRIURETIC PEPTIDE     Status: Abnormal   Collection Time   03/20/12  2:30 AM      Component Value Range Comment   Pro B Natriuretic peptide (BNP) 3938.0 (*) 0 - 450 pg/mL   BLOOD GAS, ARTERIAL     Status: Abnormal   Collection Time   03/20/12  2:59 AM      Component Value Range Comment   FIO2 1.00      Delivery systems BILEVEL POSITIVE AIRWAY PRESSURE      Mode BILEVEL POSITIVE AIRWAY PRESSURE      Inspiratory PAP 12      Expiratory PAP 6      pH, Arterial 7.407  7.350 - 7.450     pCO2 arterial 42.6  35.0 - 45.0 mmHg    pO2, Arterial 288.0 (*) 80.0 - 100.0 mmHg    Bicarbonate 26.3 (*) 20.0 - 24.0 mEq/L    TCO2 24.0  0 - 100 mmol/L    Acid-Base Excess 1.9  0.0 - 2.0 mmol/L    O2 Saturation 99.6      Patient temperature 98.6      Collection site RIGHT RADIAL      Drawn by [id number]      Sample type ARTERIAL DRAW      Allens test (pass/fail) PASS  PASS   CARDIAC PANEL(CRET  KIN+CKTOT+MB+TROPI)     Status: Normal   Collection Time   03/20/12  8:40 AM      Component Value Range Comment   Total CK 41  7 - 177 U/L    CK, MB 2.2  0.3 - 4.0 ng/mL    Troponin I <0.30  <0.30 ng/mL    Relative Index RELATIVE INDEX IS INVALID  0.0 - 2.5     IMAGING: Dg Chest Portable 1 View  03/20/2012  *RADIOLOGY REPORT*  Clinical Data: Shortness of breath and weakness.  PORTABLE CHEST - 1 VIEW  Comparison: Chest radiograph performed 01/12/2012  Findings: The lungs are well-aerated.  Vascular congestion is noted, with bilateral central and bibasilar airspace opacities, concerning for pulmonary edema.  Superimposed pneumonia is a concern.  There is no evidence of pleural effusion or pneumothorax.  The cardiomediastinal silhouette is mildly enlarged; calcification is noted within the aortic arch.  No acute osseous abnormalities are seen.  There is stable likely chronic deformity of the left humeral neck.  IMPRESSION:  1.  Vascular congestion and mild cardiomegaly, with bilateral central and bibasilar airspace opacities, concerning for pulmonary edema.  Superimposed pneumonia is a concern. 2.  Stable likely chronic deformity of the left humeral neck.  Original Report Authenticated By: Tonia Ghent, M.D.    IMPRESSION: Principal Problem:  *Acute on chronic diastolic CHF (congestive heart failure) Active Problems:  Hypertension, uncontrolled on adm 03/20/12  SOB (shortness of breath)  Paroxysmal atrial fibrillation, NSR on adm 03/20/12, poor Coumadin candidate secondary to falls  Chronic diastolic HF  (heart failure)  HCAP (healthcare-associated pneumonia)  Leukocytosis  Stroke, acute, Rt brain, embolic March 2013  PVD, Lt CA stent 04/02/11   RECOMMENDATION: Should be OK to transition back to PO meds for HTN. Increase Bidil. Gentle diuresis. MD to see.   Time Spent Directly with Patient: 40 minutes  Sara [name],Sara [name] 03/20/2012, 11:38 AM    Patient seen and examined. Agree with assessment and plan. Very pleasant 78 yo WF  With h/o htn, carotid stenting, CAD, remote CVA who has grade 2 diastolic dysfunction. Admitted now with CHF and diastolic dysfunction and possible pneumonia.  BNP is elevated. CXR suggesting of pulmonary edema and pneumonia. Agree with gentle diuresis. Pt is already on ace-I, nitrates/hydralazine, add calcium channel blocker. Consider adding low dose aldosterone blockade with spironolactone 12.5 mg daily and f/u renal fxn and [name].   Lennette Bihari, MD, [facility] 03/20/2012 6:49 PM

## 2012-03-20 NOTE — H&P (Signed)
DATE OF ADMISSION:  03/20/2012  PCP:   Londell Moh, MD   Chief Complaint: SOB   HPI: Sara [name] is an 78 y.o. female from [facility] presenting with complaints of worsening SOB X 3 days.  When she arrived in the ED she had O2 sat in the 80s, and was hypertensive with a systolic BP of 223.  She was placed on BiPAP, and a IV NTG drip and had  Given IV 60 mg IV lasix X 1 and had improvement and was weaned off the BiPAP.  Her O2 saturation improved 96%.  She denies having any chest pain, fevers chills or cough.    Past Medical History  Diagnosis Date  . Hypertension   . Atrial fibrillation   . Psoriasis   . Congestive heart failure (CHF)   . Basal cell carcinoma   . History of CVA (cerebrovascular accident)   . Coronary artery disease   . Myocardial infarction   . CHF (congestive heart failure)   . Stroke   . Shortness of breath   . H/O hiatal hernia   . Closed fracture of head of left humerus 5/12    Past Surgical History  Procedure Date  . Tonsillectomy   . Dilation and curettage of uterus   . Eye surgery   . Cataracts   . Vertebroplasty     Medications:  HOME MEDS: Prior to Admission medications   Medication Sig Start Date End Date Taking? Authorizing Provider  acetaminophen (TYLENOL) 500 MG tablet Take 500 mg by mouth every 4 (four) hours as needed. Pain.    Historical Provider, MD  aspirin EC 81 MG tablet Take 162 mg by mouth daily.    Historical Provider, MD  carvedilol (COREG) 25 MG tablet Take 1 tablet (25 mg total) by mouth 2 (two) times daily with a meal. 01/14/12 01/13/13  Gwenyth Bender, NP  clobetasol (TEMOVATE) 0.05 % external solution Apply 1 application topically 2 (two) times daily. Apply to affected areas for psoriasis.    Historical Provider, MD  clopidogrel (PLAVIX) 75 MG tablet Take 75 mg by mouth daily.    Historical Provider, MD  Coenzyme Q10 (CO Q 10) 100 MG CAPS Take 1 capsule by mouth daily.    Historical Provider, MD  diltiazem  (CARDIZEM CD) 120 MG 24 hr capsule Take 120 mg by mouth daily.    Historical Provider, MD  Docusate Calcium (STOOL SOFTENER PO) Take 1 capsule by mouth daily.    Historical Provider, MD  Emollient (HYLATOPIC) FOAM Apply 1 application topically 2 (two) times daily as needed. Psoriasis.    Historical Provider, MD  feeding supplement (ENSURE COMPLETE) LIQD Take 237 mLs by mouth 3 (three) times daily between meals. 01/14/12   Gwenyth Bender, NP  folic acid (FOLVITE) 1 MG tablet Take 1 mg by mouth daily.    Historical Provider, MD  furosemide (LASIX) 40 MG tablet Take 40 mg by mouth every morning.    Historical Provider, MD  iron polysaccharides (NIFEREX) 150 MG capsule Take 1 capsule (150 mg total) by mouth daily with breakfast. 12/10/11 12/09/12  Osvaldo Shipper, MD  isosorbide-hydrALAZINE (BIDIL) 20-37.5 MG per tablet Take 0.5 tablets by mouth 2 (two) times daily.    Historical Provider, MD  lisinopril (PRINIVIL,ZESTRIL) 40 MG tablet Take 20 mg by mouth daily.    Historical Provider, MD  MAGNESIUM PO Take 1 tablet by mouth daily. 500mg     Historical Provider, MD  menthol-cetylpyridinium (CEPACOL) 3 MG lozenge  Take 1 lozenge (3 mg total) by mouth as needed. 01/14/12 01/13/13  Gwenyth Bender, NP  methotrexate 2.5 MG tablet Take 7.5 mg by mouth once a week. She takes on Saturdays.    Historical Provider, MD  Omega-3 Fatty Acids (FISH OIL) 1000 MG CAPS Take 2,000 mg by mouth daily.    Historical Provider, MD  oxyCODONE (OXY IR/ROXICODONE) 5 MG immediate release tablet Take 1-2 tablets (5-10 mg total) by mouth See admin instructions. Take 5mg  by mouth every 4 hours as needed for mild pain. Take 10mg  by mouth every 4 hours as needed for severe pain. 01/14/12   Gwenyth Bender, NP  pantoprazole (PROTONIX) 40 MG tablet Take 40 mg by mouth daily.    Historical Provider, MD  potassium chloride SA (K-DUR,KLOR-CON) 20 MEQ tablet Take 20 mEq by mouth daily.    Historical Provider, MD  rosuvastatin (CRESTOR) 20 MG tablet Take 10  mg by mouth every other day.    Historical Provider, MD  triamcinolone ointment (KENALOG) 0.1 % Apply 1 application topically 2 (two) times daily. To legs and feet for psoriasis.    Historical Provider, MD  vitamin C (ASCORBIC ACID) 500 MG tablet Take 500 mg by mouth daily.    Historical Provider, MD    Allergies:  Allergies  Allergen Reactions  . Penicillins Other (See Comments)    unknown  . Triamterene Other (See Comments)    unknown  . Alendronate Sodium Rash    Social History:   reports that she has never smoked. She has never used smokeless tobacco. She reports that she does not drink alcohol or use illicit drugs.  Family History: Family History  Problem Relation Age of Onset  . Stomach cancer Mother   . Pneumonia Father     Review of Systems:  The patient denies anorexia, fever, weight loss, vision loss, decreased hearing, hoarseness, chest pain, syncope, peripheral edema, balance deficits, hemoptysis, abdominal pain, melena, hematochezia, severe indigestion/heartburn, hematuria, incontinence, genital sores, muscle weakness, suspicious skin lesions, transient blindness, difficulty walking, depression, unusual weight change, abnormal bleeding, enlarged lymph nodes, angioedema, and breast masses.   Physical Exam:  GEN:  Pleasant 78 year old elderly well nourished well developed Caucasian female examined  and in no acute distress; cooperative with exam Filed Vitals:   03/20/12 0324 03/20/12 0400 03/20/12 0446 03/20/12 0500  BP: 138/54 138/64 150/60 153/68  Pulse: 64 55 76 72  Temp:      TempSrc:   Oral   Resp: 19 16 16 16   SpO2: 94% 96% 96% 96%   Blood pressure 153/68, pulse 72, temperature 98.1 F (36.7 C), temperature source Oral, resp. rate 16, SpO2 96.00%. PSYCH: She is alert and oriented x4; does not appear anxious does not appear depressed; affect is normal HEENT: Normocephalic and Atraumatic, Mucous membranes pink; PERRLA; EOM intact; Fundi:  Benign;  No scleral  icterus, Nares: Patent, Oropharynx: Clear, Fair Dentition, Neck:  FROM, no cervical lymphadenopathy nor thyromegaly or carotid bruit; no JVD; Breasts:: Not examined CHEST WALL: No tenderness CHEST: Normal respiration, clear to auscultation bilaterally HEART: Regular rate and rhythm; no murmurs rubs or gallops BACK: No kyphosis or scoliosis; no CVA tenderness ABDOMEN: Positive Bowel Sounds,  soft non-tender; no masses.    Rectal Exam: Not done EXTREMITIES: No bone or joint deformity; age-appropriate arthropathy of the hands and knees; no cyanosis, clubbing or edema; no ulcerations. Genitalia: not examined PULSES: 2+ and symmetric SKIN: Normal hydration no rash or ulceration CNS: Cranial nerves 2-12  grossly intact no focal neurologic deficit   Labs & Imaging Results for orders placed during the hospital encounter of 03/20/12 (from the past 48 hour(s))  CBC     Status: Abnormal   Collection Time   03/20/12  2:30 AM      Component Value Range Comment   WBC 23.0 (*) 4.0 - 10.5 K/uL    RBC 3.87  3.87 - 5.11 MIL/uL    Hemoglobin 12.4  12.0 - 15.0 g/dL    HCT 16.1  09.6 - 04.5 %    MCV 95.1  78.0 - 100.0 fL    MCH 32.0  26.0 - 34.0 pg    MCHC 33.7  30.0 - 36.0 g/dL    RDW 40.9  81.1 - 91.4 %    Platelets 272  150 - 400 K/uL   BASIC METABOLIC PANEL     Status: Abnormal   Collection Time   03/20/12  2:30 AM      Component Value Range Comment   Sodium 133 (*) 135 - 145 mEq/L    Potassium 4.4  3.5 - 5.1 mEq/L    Chloride 96  96 - 112 mEq/L    CO2 27  19 - 32 mEq/L    Glucose, Bld 218 (*) 70 - 99 mg/dL    BUN 17  6 - 23 mg/dL    Creatinine, Ser 7.82  0.50 - 1.10 mg/dL    Calcium 9.0  8.4 - 95.6 mg/dL    GFR calc non Af Amer 75 (*) >90 mL/min    GFR calc Af Amer 87 (*) >90 mL/min   TROPONIN I     Status: Normal   Collection Time   03/20/12  2:30 AM      Component Value Range Comment   Troponin I <0.30  <0.30 ng/mL   PRO B NATRIURETIC PEPTIDE     Status: Abnormal   Collection Time    03/20/12  2:30 AM      Component Value Range Comment   Pro B Natriuretic peptide (BNP) 3938.0 (*) 0 - 450 pg/mL   BLOOD GAS, ARTERIAL     Status: Abnormal   Collection Time   03/20/12  2:59 AM      Component Value Range Comment   FIO2 1.00      Delivery systems BILEVEL POSITIVE AIRWAY PRESSURE      Mode BILEVEL POSITIVE AIRWAY PRESSURE      Inspiratory PAP 12      Expiratory PAP 6      pH, Arterial 7.407  7.350 - 7.450    pCO2 arterial 42.6  35.0 - 45.0 mmHg    pO2, Arterial 288.0 (*) 80.0 - 100.0 mmHg    Bicarbonate 26.3 (*) 20.0 - 24.0 mEq/L    TCO2 24.0  0 - 100 mmol/L    Acid-Base Excess 1.9  0.0 - 2.0 mmol/L    O2 Saturation 99.6      Patient temperature 98.6      Collection site RIGHT RADIAL      Drawn by [id number]      Sample type ARTERIAL DRAW      Allens test (pass/fail) PASS  PASS    Dg Chest Portable 1 View  03/20/2012  *RADIOLOGY REPORT*  Clinical Data: Shortness of breath and weakness.  PORTABLE CHEST - 1 VIEW  Comparison: Chest radiograph performed 01/12/2012  Findings: The lungs are well-aerated.  Vascular congestion is noted, with bilateral central and bibasilar airspace opacities, concerning for pulmonary edema.  Superimposed pneumonia is a concern.  There is no evidence of pleural effusion or pneumothorax.  The cardiomediastinal silhouette is mildly enlarged; calcification is noted within the aortic arch.  No acute osseous abnormalities are seen.  There is stable likely chronic deformity of the left humeral neck.  IMPRESSION:  1.  Vascular congestion and mild cardiomegaly, with bilateral central and bibasilar airspace opacities, concerning for pulmonary edema.  Superimposed pneumonia is a concern. 2.  Stable likely chronic deformity of the left humeral neck.  Original Report Authenticated By: Tonia Ghent, M.D.    EKG:  Sinus Rhythm    Assessment: Present on Admission:  .HCAP (healthcare-associated pneumonia) .Acute on chronic diastolic congestive heart failure .SOB  (shortness of breath) .Atrial fibrillation with controlled ventricular response .Leukocytosis .Hypertension   Plan:    Admit to Telemetry Bed.   1.  Acute on Chronic Congestive Heart Failure-placed on CHF Protocol, Diurese with IV Lasix.   2.  HCAP Pneumonia-  IV Vancomycin and Cefepime, Nebs, O2 PRN.  3.  SOB-O2 PRN 4.  Atrial Fibrillation with Rate Control- on Carvedilol, rate controlled, and Plavix and ASA.   5.  Leukocytosis- due to #2 6.  Hypertension-IV NTG drip, and home medicationss.   7.  DVT Prophylaxis 8.  Other plans as per orders.    CODE STATUS:      FULL CODE         Rolondo Pierre C 03/20/2012, 5:36 AM

## 2012-03-20 NOTE — Progress Notes (Signed)
I have seen and assessed the patient in degree with Dr. Lovell Sheehan assessment and plan. We'll place patient back on her home regimen of antihypertensive medications and wean off nitroglycerin drip. Will check a UA with cultures and sensitivities. Will cycle cardiac enzymes every 8 hours x3. Continue empiric IV antibiotic therapy. Continue diuresis with Lasix. Cardiology has been informed of patient's admission. Will check a 2-D echo. Monitor follow.   Ramiro Harvest, MD

## 2012-03-20 NOTE — ED Notes (Signed)
Report received-airway intact-no s/s's of distress-will continue to monitor 

## 2012-03-20 NOTE — ED Notes (Signed)
Pt removed from Bipap per Dr. Patria Mane order. Pt talking in complete sentences, stating she feels much better.

## 2012-03-20 NOTE — Progress Notes (Signed)
  Echocardiogram 2D Echocardiogram has been performed.  Cathie Beams 03/20/2012, 2:39 PM

## 2012-03-20 NOTE — ED Notes (Signed)
Pt transported from [facility] [place], reports SOB x 2 days, per first responders sats low 80's on arrival on RA. Per EMS audible wheezing, # 22 SL L hand est, Alb/Atr neb x 2 given, Solumedrol 125mg  given by EMS.

## 2012-03-20 NOTE — ED Notes (Signed)
WUJ:[id number]<JY> Expected date:03/20/12<BR> Expected time: 2:06 AM<BR> Means of arrival:Ambulance<BR> Comments:<BR> Respiratory distress, hypoxia, sob x 3 days

## 2012-03-20 NOTE — ED Notes (Signed)
RT at bedside preparing BIPAP

## 2012-03-21 LAB — BASIC METABOLIC PANEL
CO2: 31 mEq/L (ref 19–32)
Chloride: 98 mEq/L (ref 96–112)
Creatinine, Ser: 0.79 mg/dL (ref 0.50–1.10)
GFR calc Af Amer: 82 mL/min — ABNORMAL LOW (ref >90)
Potassium: 3.9 mEq/L (ref 3.5–5.1)

## 2012-03-21 LAB — CBC WITH DIFFERENTIAL/PLATELET
Basophils Absolute: 0 10*3/uL (ref 0.0–0.1)
Eosinophils Relative: 0 % (ref 0–5)
HCT: 28.3 % — ABNORMAL LOW (ref 36.0–46.0)
Hemoglobin: 9.3 g/dL — ABNORMAL LOW (ref 12.0–15.0)
Lymphocytes Relative: 17 % (ref 12–46)
Lymphs Abs: 2.5 10*3/uL (ref 0.7–4.0)
MCV: 94 fL (ref 78.0–100.0)
Monocytes Absolute: 1 10*3/uL (ref 0.1–1.0)
Monocytes Relative: 7 % (ref 3–12)
Neutro Abs: 11.2 10*3/uL — ABNORMAL HIGH (ref 1.7–7.7)
WBC: 14.7 10*3/uL — ABNORMAL HIGH (ref 4.0–10.5)

## 2012-03-21 LAB — CARDIAC PANEL(CRET KIN+CKTOT+MB+TROPI)
Relative Index: INVALID (ref 0.0–2.5)
Troponin I: <0.3 ng/mL (ref <0.30)

## 2012-03-21 MED ORDER — GUAIFENESIN 100 MG/5ML PO SOLN: 5.0000 mL | ORAL | Status: DC | PRN

## 2012-03-21 MED ORDER — LISINOPRIL 10 MG PO TABS
10.0000 mg | ORAL_TABLET | Freq: Every day | ORAL | Status: DC
  Administered 2012-03-22: 10 mg via ORAL
  Filled 2012-03-21: qty 1

## 2012-03-21 MED ORDER — CARVEDILOL 12.5 MG PO TABS
12.5000 mg | ORAL_TABLET | Freq: Two times a day (BID) | ORAL | Status: DC
  Administered 2012-03-21 – 2012-03-24 (×7): 12.5 mg via ORAL
  Filled 2012-03-21 (×8): qty 1

## 2012-03-21 MED ORDER — VANCOMYCIN HCL 500 MG IV SOLR
500.0000 mg | Freq: Two times a day (BID) | INTRAVENOUS | Status: DC
  Administered 2012-03-21 – 2012-03-23 (×5): 500 mg via INTRAVENOUS
  Filled 2012-03-21 (×5): qty 500

## 2012-03-21 MED ORDER — ROSUVASTATIN CALCIUM 10 MG PO TABS
10.0000 mg | ORAL_TABLET | ORAL | Status: DC
  Filled 2012-03-21: qty 1

## 2012-03-21 MED ORDER — ROSUVASTATIN CALCIUM 10 MG PO TABS
10.0000 mg | ORAL_TABLET | ORAL | Status: DC
  Administered 2012-03-23: 10 mg via ORAL
  Filled 2012-03-21: qty 1

## 2012-03-21 MED ORDER — NON FORMULARY: 10.0000 mg | Status: DC

## 2012-03-21 NOTE — Progress Notes (Signed)
HTN on admission with systolic B/P > 200 and acute diastolic CHF. Marland Kitchen Now hypotensive. She is at a skilled facility so assume she is compliant with medications. Will adjust meds, stop Bidil, decrease Lisinopril, and put hold parameters in place. MD to see.  Corine Shelter PA-C 03/21/2012 3:41 PM Agree with note written by Corine Shelter [facility]  Breathing better. I/O negative. BP is labile. Was 90 sysolic this am and meds werre held. Now her BP is 180 systolic. Spironolactone was added by Dr. Wonda Cheng. 2D was essentially nl with mild AS and diatolic dysfunction. CXR shows CHF. Exam benign. Lungs clear. No periph edema. Would continue to give IV lasix, follow clinically and BNP. May need to increase lisinopril. Will follow with you.  Runell Gess 03/21/2012 5:49 PM

## 2012-03-21 NOTE — Progress Notes (Addendum)
Patient blood pressure and HR low. 95/55 and HR-58 patient denies any distress. Dr Janee Morn notified and stated to hold coreg, Cardizem, Bidil and lisinipril. Will continue to assess patient. Also notified Sherre Poot with cardiology.

## 2012-03-21 NOTE — Evaluation (Signed)
Occupational Therapy Evaluation Patient Details Name: Sara [name] MRN: [id number] DOB: 04-17-1919 Today's Date: 03/21/2012 Time: 7564-3329 OT Time Calculation (min): 30 min  OT Assessment / Plan / Recommendation Clinical Impression  Sara [name] is an 78 y.o. female from [facility] who had recently been discharged from SNF at [facility] back to her independent apt after receiving rehab for her stroke in March this year. She is presenting with complaints of worsening SOB X 3 days    OT Assessment  Patient needs continued OT Services    Follow Up Recommendations  Skilled nursing facility    Barriers to Discharge      Equipment Recommendations  Defer to next venue    Recommendations for Other Services    Frequency  Min 1X/week    Precautions / Restrictions Precautions Precautions: Fall Precaution Comments: contact precautions        ADL  Eating/Feeding: Simulated;Independent Where Assessed - Eating/Feeding: Bed level Grooming: Performed;Set up;Supervision/safety;Other (comment) (due to dizziness EOB) Where Assessed - Grooming: Unsupported sitting Upper Body Bathing: Simulated;Chest;Right arm;Left arm;Abdomen;Supervision/safety;Set up Where Assessed - Upper Body Bathing: Unsupported sitting Lower Body Bathing: Simulated;Minimal assistance Where Assessed - Lower Body Bathing: Supported sit to stand Upper Body Dressing: Simulated;Supervision/safety;Set up Where Assessed - Upper Body Dressing: Unsupported sitting Lower Body Dressing: Simulated;Minimal assistance Where Assessed - Lower Body Dressing: Supported sit to stand Toilet Transfer: Simulated;Minimal assistance Toilet Transfer Method: Stand pivot;Other (comment) (only bed to chair due to dizziness) Toileting - Clothing Manipulation and Hygiene: Simulated;Minimal assistance Where Assessed - Glass blower/designer Manipulation and Hygiene: Standing Tub/Shower Transfer Method: Not assessed Equipment Used: Rolling  walker ADL Comments: Pt wtih some dizziness upon sitting EOB. Improved after several minutes but still present. BPs taken and recorded in vital section. Nsg made aware. Pt states she wanted to get up to chair but pivoted only due to still dizzy. Pt would like to return to SNF at discharge to help rebuild strength before returning to her apt. She was only at apt about a week and a half from being in SNF for rehab from her stroke.    OT Diagnosis: Generalized weakness  OT Problem List: Decreased strength;Decreased knowledge of use of DME or AE;Pain;Decreased activity tolerance OT Treatment Interventions: Self-care/ADL training;Therapeutic activities;DME and/or AE instruction;Patient/family education   OT Goals Acute Rehab OT Goals OT Goal Formulation: With patient Time For Goal Achievement: 04/04/12 Potential to Achieve Goals: Good ADL Goals Pt Will Perform Grooming: with supervision;Standing at sink ADL Goal: Grooming - Progress: Goal set today Pt Will Perform Lower Body Bathing: with supervision;Sit to stand from chair;Sit to stand from bed ADL Goal: Lower Body Bathing - Progress: Goal set today Pt Will Perform Lower Body Dressing: with supervision;Sit to stand from chair;Sit to stand from bed ADL Goal: Lower Body Dressing - Progress: Goal set today Pt Will Transfer to Toilet: with supervision;Ambulation;with DME;3-in-1 ADL Goal: Toilet Transfer - Progress: Goal set today Pt Will Perform Toileting - Clothing Manipulation: with supervision;Standing ADL Goal: Toileting - Clothing Manipulation - Progress: Goal set today  Visit Information  Last OT Received On: 03/21/12 Assistance Needed: +1    Subjective Data  Subjective: I would like to get up to the chair Patient Stated Goal: be independent again   Prior Functioning  Home Living Lives With: Alone (had only been in apt for alittle over a week from SNF) Available Help at Discharge: Skilled Nursing Facility Bathroom Shower/Tub:  Walk-in shower Home Adaptive Equipment: Dan Humphreys - four wheeled Additional Comments: Pt  was in SNF at [facility] after her stroke in March this year and had only been home a week and a half when she became increasingly short of breath and was admitted. Prior Function Level of Independence: Independent with assistive device(s) Driving: Yes Communication Communication: No difficulties Dominant Hand: Right    Cognition  Overall Cognitive Status: Appears within functional limits for tasks assessed/performed Arousal/Alertness: Awake/alert Orientation Level: Appears intact for tasks assessed Behavior During Session: [facility] for tasks performed    Extremity/Trunk Assessment Right Upper Extremity Assessment RUE ROM/Strength/Tone: WFL for tasks assessed RUE Sensation: WFL - Light Touch RUE Coordination: WFL - gross/fine motor Left Upper Extremity Assessment LUE ROM/Strength/Tone: WFL for tasks assessed LUE Sensation: WFL - Light Touch LUE Coordination: WFL - gross/fine motor (pt reports occasssional fine motor difficulties.)   Mobility Bed Mobility Bed Mobility: Supine to Sit Supine to Sit: 4: Min assist;HOB elevated;With rails Details for Bed Mobility Assistance: min assist to bring trunk to upright and verbal cues for use of rail Transfers Transfers: Sit to Stand;Stand to Sit Sit to Stand: 4: Min assist;With upper extremity assist;From bed Stand to Sit: 4: Min assist;With upper extremity assist;To chair/3-in-1 Details for Transfer Assistance: min verbal cues for technique/hand placement   Exercise    Balance    End of Session OT - End of Session Activity Tolerance: Other (comment) (dizziness) Patient left: in chair;with call bell/phone within reach  GO     Lennox Laity [phone] 03/21/2012, 9:21 AM

## 2012-03-21 NOTE — Progress Notes (Signed)
PT Cancellation Note  ___Treatment cancelled today due to medical issues with patient which prohibited   therapy  ___ Treatment cancelled today due to patient receiving procedure or test   ___ Treatment cancelled today due to patient's refusal to participate   _x__ Treatment cancelled today due to pt wanted to get up but had painful infiltrated IV site and declined PT .

## 2012-03-21 NOTE — Progress Notes (Signed)
Patient BP elevating compare to this AM that her BP medication was held due to low BP. Informed Dr. Allyson Sabal and Dr Janee Morn, Dr. Janee Morn stated to give patient the held Cardizem. Will continue to asses patient.

## 2012-03-21 NOTE — Progress Notes (Addendum)
Assisted pt to [facility]. Pt voided clear yellow urine. Peri care completed. Pt given topical ointment skin treatment per doctor order. Daughter Marjean Donna at the bedside. Updated pt contact list per request of new number for [facility] and additional contact added Dr. Evelina Bucy. Pt assisted back to bed and resting comfortably at this time. Will follow up per unit protocol.   Pt refused CHG Bath this morning, stating " My skin is too sensitive and I have psoriasis. I want to ask my Dermatologist first."

## 2012-03-21 NOTE — Progress Notes (Signed)
ANTIBIOTIC CONSULT NOTE - INITIAL  Pharmacy Consult for Vanco Indication: Suspected PNA  Allergies  Allergen Reactions  . Penicillins Other (See Comments)    unknown  . Triamterene Other (See Comments)    unknown  . Alendronate Sodium Rash    Patient Measurements: Height: 5\' 6"  (167.6 cm) Weight: 142 lb 6.7 oz (64.6 kg) (bed scale) IBW/kg (Calculated) : 59.3   Vital Signs: Temp: 98 F (36.7 C) (07/09 1000) Temp src: Oral (07/09 1000) BP: 116/63 mmHg (07/09 1000) Pulse Rate: 54  (07/09 1000) Intake/Output from previous day: 07/08 0701 - 07/09 0700 In: 1660 [P.O.:1560; IV Piggyback:100] Out: 4100 [Urine:4100] Intake/Output from this shift: Total I/O In: 120 [P.O.:120] Out: -   Labs:  Basename 03/21/12 0242 03/20/12 0230  WBC 14.7* 23.0*  HGB 9.3* 12.4  PLT 219 272  LABCREA -- --  CREATININE 0.79 0.65   Estimated Creatinine Clearance: 42.9 ml/min (by C-G formula based on Cr of 0.79). No results found for this basename: VANCOTROUGH:2,VANCOPEAK:2,VANCORANDOM:2,GENTTROUGH:2,GENTPEAK:2,GENTRANDOM:2,TOBRATROUGH:2,TOBRAPEAK:2,TOBRARND:2,AMIKACINPEAK:2,AMIKACINTROU:2,AMIKACIN:2, in the last 72 hours   Microbiology: Recent Results (from the past 720 hour(s))  CULTURE, BLOOD (ROUTINE X 2)     Status: Normal (Preliminary result)   Collection Time   03/20/12  2:46 AM      Component Value Range Status Comment   Specimen Description BLOOD RIGHT ANTECUBITAL   Final    Special Requests BOTTLES DRAWN AEROBIC AND ANAEROBIC 5CC   Final    Culture  Setup Time 03/20/2012 10:49   Final    Culture     Final    Value:        BLOOD CULTURE RECEIVED NO GROWTH TO DATE CULTURE WILL BE HELD FOR 5 DAYS BEFORE ISSUING A FINAL NEGATIVE REPORT   Report Status PENDING   Incomplete   CULTURE, BLOOD (ROUTINE X 2)     Status: Normal (Preliminary result)   Collection Time   03/20/12  5:27 AM      Component Value Range Status Comment   Specimen Description BLOOD LEFT FOREARM   Final    Special  Requests BOTTLES DRAWN AEROBIC AND ANAEROBIC 5CC   Final    Culture  Setup Time 03/20/2012 10:49   Final    Culture     Final    Value:        BLOOD CULTURE RECEIVED NO GROWTH TO DATE CULTURE WILL BE HELD FOR 5 DAYS BEFORE ISSUING A FINAL NEGATIVE REPORT   Report Status PENDING   Incomplete   MRSA PCR SCREENING     Status: Abnormal   Collection Time   03/20/12  8:23 AM      Component Value Range Status Comment   MRSA by PCR POSITIVE (*) NEGATIVE Final     Medical History: Past Medical History  Diagnosis Date  . Hypertension   . Atrial fibrillation   . Psoriasis   . Congestive heart failure (CHF)   . Basal cell carcinoma   . History of CVA (cerebrovascular accident)   . Coronary artery disease   . Myocardial infarction   . CHF (congestive heart failure)   . Stroke   . Shortness of breath   . H/O hiatal hernia   . Closed fracture of head of left humerus 5/12    Medications:  Anti-infectives     Start     Dose/Rate Route Frequency Ordered Stop   03/20/12 0430   vancomycin (VANCOCIN) IVPB 1000 mg/200 mL premix        1,000 mg 200 mL/hr over  60 Minutes Intravenous  Once 03/20/12 0407 03/20/12 0725   03/20/12 0430   ceFEPIme (MAXIPIME) 1 g in dextrose 5 % 50 mL IVPB        1 g 100 mL/hr over 30 Minutes Intravenous Every 12 hours 03/20/12 0410           Assessment: 78 yo F from SNF admitted 7/8 with CC: SOB x 3 days. Currently on Day #2 of cefepime for HCAP. Was given Vanco 1g IV x1 on 7/8 at 06:25. Order to add Vanco to cefepime for suspected PNA.  Goal of Therapy:  Vancomycin trough level 15-20 mcg/ml  Plan:  Vanco 500mg  IV q12h  Darrol Angel, PharmD Pager: [phone] 03/21/2012,1:48 PM

## 2012-03-21 NOTE — Progress Notes (Signed)
Subjective: Patient states shortness of breath is improved significantly. No chest pain. Patient states cough is improving.  Objective: Vital signs in last 24 hours: Filed Vitals:   03/21/12 0842 03/21/12 0845 03/21/12 0855 03/21/12 1000  BP: 91/51 90/48 95/55  116/63  Pulse: 59  59 54  Temp:    98 F (36.7 C)  TempSrc:    Oral  Resp:    18  Height:      Weight:      SpO2:    98%    Intake/Output Summary (Last 24 hours) at 03/21/12 1337 Last data filed at 03/21/12 1012  Gross per 24 hour  Intake   1180 ml  Output   2800 ml  Net  -1620 ml    Weight change:   General: Alert, awake, oriented x3, in no acute distress. HEENT: No bruits, no goiter. Heart: Regular rate and rhythm, without murmurs, rubs, gallops. Lungs: Diffuse crackles Abdomen: Soft, nontender, nondistended, positive bowel sounds. Extremities: No clubbing cyanosis or edema with positive pedal pulses. Neuro: Grossly intact, nonfocal.    Lab Results:  Basename 03/21/12 0242 03/20/12 0230  NA 136 133*  K 3.9 4.4  CL 98 96  CO2 31 27  GLUCOSE 135* 218*  BUN 23 17  CREATININE 0.79 0.65  CALCIUM 8.6 9.0  MG -- --  PHOS -- --   No results found for this basename: AST:2,ALT:2,ALKPHOS:2,BILITOT:2,PROT:2,ALBUMIN:2 in the last 72 hours No results found for this basename: LIPASE:2,AMYLASE:2 in the last 72 hours  Basename 03/21/12 0242 03/20/12 0230  WBC 14.7* 23.0*  NEUTROABS 11.2* --  HGB 9.3* 12.4  HCT 28.3* 36.8  MCV 94.0 95.1  PLT 219 272    Basename 03/21/12 0242 03/20/12 1916 03/20/12 0840  CKTOTAL 28 33 41  CKMB 2.1 2.2 2.2  CKMBINDEX -- -- --  TROPONINI <0.30 <0.30 <0.30   No components found with this basename: POCBNP:3 No results found for this basename: DDIMER:2 in the last 72 hours No results found for this basename: HGBA1C:2 in the last 72 hours No results found for this basename: CHOL:2,HDL:2,LDLCALC:2,TRIG:2,CHOLHDL:2,LDLDIRECT:2 in the last 72 hours  Basename 03/20/12 0840  TSH  1.072  T4TOTAL --  T3FREE --  THYROIDAB --   No results found for this basename: VITAMINB12:2,FOLATE:2,FERRITIN:2,TIBC:2,IRON:2,RETICCTPCT:2 in the last 72 hours  Micro Results: Recent Results (from the past 240 hour(s))  CULTURE, BLOOD (ROUTINE X 2)     Status: Normal (Preliminary result)   Collection Time   03/20/12  2:46 AM      Component Value Range Status Comment   Specimen Description BLOOD RIGHT ANTECUBITAL   Final    Special Requests BOTTLES DRAWN AEROBIC AND ANAEROBIC 5CC   Final    Culture  Setup Time 03/20/2012 10:49   Final    Culture     Final    Value:        BLOOD CULTURE RECEIVED NO GROWTH TO DATE CULTURE WILL BE HELD FOR 5 DAYS BEFORE ISSUING A FINAL NEGATIVE REPORT   Report Status PENDING   Incomplete   CULTURE, BLOOD (ROUTINE X 2)     Status: Normal (Preliminary result)   Collection Time   03/20/12  5:27 AM      Component Value Range Status Comment   Specimen Description BLOOD LEFT FOREARM   Final    Special Requests BOTTLES DRAWN AEROBIC AND ANAEROBIC 5CC   Final    Culture  Setup Time 03/20/2012 10:49   Final    Culture  Final    Value:        BLOOD CULTURE RECEIVED NO GROWTH TO DATE CULTURE WILL BE HELD FOR 5 DAYS BEFORE ISSUING A FINAL NEGATIVE REPORT   Report Status PENDING   Incomplete   MRSA PCR SCREENING     Status: Abnormal   Collection Time   03/20/12  8:23 AM      Component Value Range Status Comment   MRSA by PCR POSITIVE (*) NEGATIVE Final     Studies/Results: Dg Chest Portable 1 View  03/20/2012  *RADIOLOGY REPORT*  Clinical Data: Shortness of breath and weakness.  PORTABLE CHEST - 1 VIEW  Comparison: Chest radiograph performed 01/12/2012  Findings: The lungs are well-aerated.  Vascular congestion is noted, with bilateral central and bibasilar airspace opacities, concerning for pulmonary edema.  Superimposed pneumonia is a concern.  There is no evidence of pleural effusion or pneumothorax.  The cardiomediastinal silhouette is mildly enlarged;  calcification is noted within the aortic arch.  No acute osseous abnormalities are seen.  There is stable likely chronic deformity of the left humeral neck.  IMPRESSION:  1.  Vascular congestion and mild cardiomegaly, with bilateral central and bibasilar airspace opacities, concerning for pulmonary edema.  Superimposed pneumonia is a concern. 2.  Stable likely chronic deformity of the left humeral neck.  Original Report Authenticated By: Tonia Ghent, M.D.    Medications:     . aspirin EC  81 mg Oral Daily  . atorvastatin  40 mg Oral q1800  . carvedilol  25 mg Oral BID WC  . ceFEPime (MAXIPIME) IV  1 g Intravenous Q12H  . Chlorhexidine Gluconate Cloth  6 each Topical Q0600  . clopidogrel  75 mg Oral Daily  . diltiazem  120 mg Oral Daily  . docusate sodium  100 mg Oral Daily  . enoxaparin (LOVENOX) injection  40 mg Subcutaneous Q24H  . feeding supplement  237 mL Oral TID BM  . folic acid  1 mg Oral Daily  . furosemide  40 mg Intravenous Q12H  . iron polysaccharides  150 mg Oral Q breakfast  . isosorbide-hydrALAZINE  1 tablet Oral BID  . lisinopril  20 mg Oral Daily  . magnesium gluconate  500 mg Oral Daily  . methotrexate  7.5 mg Oral Weekly  . mupirocin ointment  1 application Nasal BID  . omega-3 acid ethyl esters  1 g Oral Daily  . pantoprazole  40 mg Oral Q1200  . potassium chloride  20 mEq Oral BID  . potassium chloride SA  20 mEq Oral Daily  . sodium chloride  3 mL Intravenous Q12H  . spironolactone  12.5 mg Oral Daily  . triamcinolone ointment  1 application Topical BID  . vitamin C  500 mg Oral Daily    Assessment: Principal Problem:  *Acute on chronic diastolic CHF (congestive heart failure) Active Problems:  Leukocytosis  Hypertension, uncontrolled on adm 03/20/12  Paroxysmal atrial fibrillation, NSR on adm 03/20/12, poor Coumadin candidate secondary to falls  Chronic diastolic HF (heart failure)  Stroke, acute, Rt brain, embolic March 2013  HCAP  (healthcare-associated pneumonia)  SOB (shortness of breath)  PVD, Lt CA stent 04/02/11   Plan: #1 acute on chronic diastolic heart failure Patient with clinical improvement with diuresis. I/L. equal -2.4 L. Cardiac enzymes negative x3. Pro BNP is 12/20/2007 from 3938. Patient's systolic blood pressure was borderline this morning and a such patient's antihypertensive medications of spironolactone, lisinopril, vital, Coreg, diltiazem were held. Continue diuresis with IV Lasix. Cardiology  is following. Will defer to cardiology on titration of cardiac medications. May need to decrease the dose secondary to borderline blood pressure. Will DC nitroglycerin drip which per nursing has been off since yesterday. Per cardiology.  #2 probable healthcare associated pneumonia Clinical improvement. WBC is trending down and now a 14.7. Afebrile. Blood cultures are pending. Continue IV cefepime. Will add IV vancomycin to regimen. Robitussin as needed. Continue oxygen.  #3 paroxysmal A. fib Heart rate is controlled with some bradycardia. Due to borderline blood pressure patient's Coreg and diltiazem were held this morning. Will defer to cardiology. Patient deemed a poor Coumadin candidate secondary to falls. Continue aspirin and Plavix.  #4 leukocytosis Likely secondary to problem #2. UA is negative. WBC is trending down. Continue empiric antibiotics of IV cefepime and vancomycin.  #5 hypertension BP meds held today secondary to borderline systolic blood pressure in the 90s.  #6 prophylaxis PPI for GI prophylaxis. Lovenox for DVT prophylaxis.   LOS: 1 day   Sara [name] [phone] 03/21/2012, 1:37 PM

## 2012-03-21 NOTE — Progress Notes (Signed)
Brief Nutrition Note  Reason: Patient screened positive for nutrition risk for unintentional weight loss.   Patient reported a good appetite and intake. Patient denies any problems chewing or swallowing. She is drinking some of the Ensure that is sent TID. She reported a UBW around 140 and stated she has not lost any weight. PO intake documented 100% of heart healthy meals. Patient not at nutrition risk at this time.   RD available for nutrition needs.   Iven Finn [facility] [phone]

## 2012-03-22 DIAGNOSIS — D72829 Elevated white blood cell count, unspecified: Secondary | ICD-10-CM

## 2012-03-22 LAB — BASIC METABOLIC PANEL
BUN: 24 mg/dL — ABNORMAL HIGH (ref 6–23)
CO2: 32 mEq/L (ref 19–32)
Chloride: 99 mEq/L (ref 96–112)
Creatinine, Ser: 0.7 mg/dL (ref 0.50–1.10)
GFR calc Af Amer: 85 mL/min — ABNORMAL LOW (ref >90)
Glucose, Bld: 89 mg/dL (ref 70–99)
Potassium: 4.5 mEq/L (ref 3.5–5.1)

## 2012-03-22 LAB — URINE CULTURE: Culture: NO GROWTH

## 2012-03-22 LAB — CBC WITH DIFFERENTIAL/PLATELET
Basophils Relative: 0 % (ref 0–1)
Eosinophils Relative: 2 % (ref 0–5)
HCT: 32 % — ABNORMAL LOW (ref 36.0–46.0)
Hemoglobin: 10.3 g/dL — ABNORMAL LOW (ref 12.0–15.0)
MCH: 31.1 pg (ref 26.0–34.0)
Monocytes Absolute: 1 10*3/uL (ref 0.1–1.0)
Neutro Abs: 9 10*3/uL — ABNORMAL HIGH (ref 1.7–7.7)
Neutrophils Relative %: 52 % (ref 43–77)
RBC: 3.31 MIL/uL — ABNORMAL LOW (ref 3.87–5.11)

## 2012-03-22 LAB — PATHOLOGIST SMEAR REVIEW

## 2012-03-22 MED ORDER — POTASSIUM CHLORIDE CRYS ER 10 MEQ PO TBCR
10.0000 meq | EXTENDED_RELEASE_TABLET | Freq: Every day | ORAL | Status: DC
  Administered 2012-03-23 – 2012-03-24 (×2): 10 meq via ORAL
  Filled 2012-03-22 (×2): qty 1

## 2012-03-22 MED ORDER — LISINOPRIL 20 MG PO TABS
20.0000 mg | ORAL_TABLET | Freq: Every day | ORAL | Status: DC
  Administered 2012-03-22 – 2012-03-24 (×3): 20 mg via ORAL
  Filled 2012-03-22 (×3): qty 1

## 2012-03-22 NOTE — Progress Notes (Signed)
   CARE MANAGEMENT NOTE 03/22/2012  Patient:  Sara [name],Sara [name]   Account Number:  [id number]  Date Initiated:  03/22/2012  Documentation initiated by:  Sara [name]  Subjective/Objective Assessment:   ADMITTED WITH CHF     Action/Plan:   PCP:   Sara Moh, MD  PATIENT IS [name] RESIDENT OF [facility]   Anticipated DC Date:  03/29/2012   Anticipated DC Plan:  SKILLED NURSING FACILITY  In-house referral  Clinical Social Worker      DC Planning Services  CM consult           Status of service:  In process, will continue to follow Medicare Important Message given?  NA - LOS <3 / Initial given by admissions (If response is "NO", the following Medicare IM given date fields will be blank)  Per UR Regulation:  Reviewed for med. necessity/level of care/duration of stay  Comments:  03/22/2012- Sara Aadi Bordner RN, BSN, MHA

## 2012-03-22 NOTE — Progress Notes (Signed)
Patient has bed @ [facility] when medically stable. FL2 & yellow DNR on shadow chart for MD signature.   Clinical Social Work Department BRIEF PSYCHOSOCIAL ASSESSMENT 03/22/2012  Patient:  LESA, VANDALL     Account Number:  [id number]     Admit date:  03/20/2012  Clinical Social Worker:  Orpah Greek  Date/Time:  03/22/2012 02:44 PM  Referred by:  Physician  Date Referred:  03/22/2012 Referred for  Other - See comment   Other Referral:   Admitted from [facility]   Interview type:  Patient Other interview type:    PSYCHOSOCIAL DATA Living Status:  FACILITY Admitted from facility:  [facility] Level of care:  Independent Living Primary support name:  Sheran Spine (daughter) h#: [phone] Primary support relationship to patient:  CHILD, ADULT Degree of support available:   good    CURRENT CONCERNS Current Concerns  Post-Acute Placement   Other Concerns:    SOCIAL WORK ASSESSMENT / PLAN CSW spoke with patient re: discharge planning. Patient was admitted from [facility].   Assessment/plan status:  Information/Referral to [facility] Other assessment/ plan:   Information/referral to community resources:   CSW spoke with Katie @ [facility] to confirm that they would have a bed available at their SNF for patient at discharge. CSW completed FL2 and faxed information out to facility.    PATIENT'S/FAMILY'S RESPONSE TO PLAN OF CARE: Patient states she had been talking to Katie & Nedra Hai @ [facility] and is agreeable to Rehab @ their SNF before returning to Independent Living.        Unice Bailey, LCSW [facility] Clinical Social Worker cell #: [phone]

## 2012-03-22 NOTE — Progress Notes (Signed)
Clinical Social Work Department CLINICAL SOCIAL WORK PLACEMENT NOTE 03/22/2012  Patient:  Sara [name], Sara [name]  Account Number:  [id number] Admit date:  03/20/2012  Clinical Social Worker:  Orpah Greek  Date/time:  03/22/2012 02:48 PM  Clinical Social Work is seeking post-discharge placement for this patient at the following level of care:   SKILLED NURSING   (*CSW will update this form in Epic as items are completed)     Patient/family provided with [facility] Department of Clinical Social Work's list of facilities offering this level of care within the geographic area requested by the patient (or if unable, by the patient's family).    Patient/family informed of their freedom to choose among providers that offer the needed level of care, that participate in Medicare, Medicaid or managed care program needed by the patient, have an available bed and are willing to accept the patient.    Patient/family informed of [facility]' ownership interest in [facility], as well as of the fact that they are under no obligation to receive care at this facility.  PASARR submitted to EDS on 03/22/2012 PASARR number received from EDS on 03/22/2012  FL2 transmitted to all facilities in geographic area requested by pt/family on  03/22/2012 FL2 transmitted to all facilities within larger geographic area on   Patient informed that his/her managed care company has contracts with or will negotiate with  certain facilities, including the following:     Patient/family informed of bed offers received:  03/22/2012 Patient chooses bed at [facility] AT [facility] Physician recommends and patient chooses bed at    Patient to be transferred to [facility] AT [facility] on   Patient to be transferred to facility by   The following physician request were entered in Epic:   Additional Comments:    Unice Bailey, LCSW [facility] Clinical Social Worker cell #: [phone]

## 2012-03-22 NOTE — Progress Notes (Signed)
[facility] PROGRESS NOTE  Sara [name] ZOX:[id number] DOB: 04/17/1919 DOA: 03/20/2012 PCP: Londell Moh, MD  Assessment/Plan: Principal Problem:  *Acute on chronic diastolic CHF (congestive heart failure) Active Problems:  Leukocytosis  Hypertension, uncontrolled on adm 03/20/12  Paroxysmal atrial fibrillation, NSR on adm 03/20/12, poor Coumadin candidate secondary to falls  Chronic diastolic HF (heart failure)  Stroke, acute, Rt brain, embolic March 2013  HCAP (healthcare-associated pneumonia)  SOB (shortness of breath)  PVD, Lt CA stent 04/02/11   Acute on chronic diastolic heart failure -Appreciate cardiology help, patient improving clinically. -She is on IV Lasix, continue. Her BNP is pending down secondary to diuresis. -Blood pressure was in the high side, probably was contributing to the diastolic CHF exacerbation. -Continue Coreg, Lasix and Aldactone, lisinopril added.  Healthcare associated pneumonia -Probable healthcare associated pneumonia, patient is afebrile now blood cultures are pending. -Patient is on cefepime and vancomycin, I will continue.  Paroxysmal atrial fibrillation -Heart rate is controlled, with some bradycardia. Patient is on Cardizem and Coreg. -Patient deemed a poor Coumadin candidate secondary to fall risk, continue aspirin and Plavix.  Leukocytosis -Patient does have chronic leukocytosis for at least the past 2 years. -She is at her baseline.  Hypertension -Pressure medication held yesterday secondary to borderline systolic blood pressure. -Labile blood pressure as it's gone up today. Cardiology is on just medications, lisinopril added.   Code Status: Full Family Communication: Remains inpatient Disposition Plan: To skilled nursing facility, likely in 1-2 days.  Brief narrative: 78 year old Caucasian female with CHF and healthcare associated pneumonia.  Consultants:  [facility]  Procedures:  2-D echocardiogram showed ejection fraction of about 63% and grade 2 diastolic dysfunction.  Antibiotics:  On cefepime and vancomycin  HPI/Subjective: She was sitting on chair when I saw her, she was very uncomfortable, as mild to moderate shortness of breath but denies any chest pain.  Objective: Filed Vitals:   03/21/12 1758 03/21/12 2113 03/22/12 0700 03/22/12 1000  BP: 169/80 183/82 175/71 155/65  Pulse: 66 76 64 66  Temp: 98 F (36.7 C) 98 F (36.7 C) 97.6 F (36.4 C) 97.8 F (36.6 C)  TempSrc: Oral Oral Oral Oral  Resp: 20 18 18 18   Height:      Weight:      SpO2:  96% 96% 96%    Intake/Output Summary (Last 24 hours) at 03/22/12 1333 Last data filed at 03/22/12 1100  Gross per 24 hour  Intake   1440 ml  Output   2800 ml  Net  -1360 ml    Exam:  General: Alert and awake, oriented x3, not in any acute distress. HEENT: anicteric sclera, pupils reactive to light and accommodation, EOMI CVS: S1-S2 clear, no murmur rubs or gallops Chest: clear to auscultation bilaterally, no wheezing, rales or rhonchi Abdomen: soft nontender, nondistended, normal bowel sounds, no organomegaly Extremities: no cyanosis, clubbing or edema noted bilaterally Neuro: Cranial nerves II-XII intact, no focal neurological deficits  Data Reviewed: Basic Metabolic Panel:  Lab 03/22/12 8119 03/21/12 0242 03/20/12 0230  NA 137 136 133*  K 4.5 3.9 4.4  CL 99 98 96  CO2 32 31 27  GLUCOSE 89 135* 218*  BUN 24* 23 17  CREATININE 0.70 0.79 0.65  CALCIUM 8.7 8.6 9.0  MG -- -- --  PHOS -- -- --   Liver Function Tests: No results found for this basename: AST:5,ALT:5,ALKPHOS:5,BILITOT:5,PROT:5,ALBUMIN:5 in the last 168 hours No results found for this basename: LIPASE:5,AMYLASE:5 in the  last 168 hours No results found for this basename: AMMONIA:5 in the last 168 hours CBC:  Lab 03/22/12 0358 03/21/12 0242 03/20/12 0230  WBC 17.1* 14.7* 23.0*  NEUTROABS 9.0* 11.2*  --  HGB 10.3* 9.3* 12.4  HCT 32.0* 28.3* 36.8  MCV 96.7 94.0 95.1  PLT 259 219 272   Cardiac Enzymes:  Lab 03/21/12 0242 03/20/12 1916 03/20/12 0840 03/20/12 0230  CKTOTAL 28 33 41 --  CKMB 2.1 2.2 2.2 --  CKMBINDEX -- -- -- --  TROPONINI <0.30 <0.30 <0.30 <0.30   BNP (last 3 results)  Basename 03/22/12 0358 03/21/12 0242 03/20/12 0230  PROBNP 2193.0* 4809.0* 3938.0*   CBG: No results found for this basename: GLUCAP:5 in the last 168 hours  Recent Results (from the past 240 hour(s))  CULTURE, BLOOD (ROUTINE X 2)     Status: Normal (Preliminary result)   Collection Time   03/20/12  2:46 AM      Component Value Range Status Comment   Specimen Description BLOOD RIGHT ANTECUBITAL   Final    Special Requests BOTTLES DRAWN AEROBIC AND ANAEROBIC 5CC   Final    Culture  Setup Time 03/20/2012 10:49   Final    Culture     Final    Value:        BLOOD CULTURE RECEIVED NO GROWTH TO DATE CULTURE WILL BE HELD FOR 5 DAYS BEFORE ISSUING A FINAL NEGATIVE REPORT   Report Status PENDING   Incomplete   CULTURE, BLOOD (ROUTINE X 2)     Status: Normal (Preliminary result)   Collection Time   03/20/12  5:27 AM      Component Value Range Status Comment   Specimen Description BLOOD LEFT FOREARM   Final    Special Requests BOTTLES DRAWN AEROBIC AND ANAEROBIC 5CC   Final    Culture  Setup Time 03/20/2012 10:49   Final    Culture     Final    Value:        BLOOD CULTURE RECEIVED NO GROWTH TO DATE CULTURE WILL BE HELD FOR 5 DAYS BEFORE ISSUING A FINAL NEGATIVE REPORT   Report Status PENDING   Incomplete   MRSA PCR SCREENING     Status: Abnormal   Collection Time   03/20/12  8:23 AM      Component Value Range Status Comment   MRSA by PCR POSITIVE (*) NEGATIVE Final   URINE CULTURE     Status: Normal   Collection Time   03/20/12  1:50 PM      Component Value Range Status Comment   Specimen Description URINE, RANDOM   Final    Special Requests NONE   Final    Culture  Setup Time 03/20/2012 21:49    Final    Colony Count NO GROWTH   Final    Culture NO GROWTH   Final    Report Status 03/22/2012 FINAL   Final      Studies: No results found.  Scheduled Meds:   . aspirin EC  81 mg Oral Daily  . carvedilol  12.5 mg Oral BID WC  . ceFEPime (MAXIPIME) IV  1 g Intravenous Q12H  . Chlorhexidine Gluconate Cloth  6 each Topical Q0600  . clopidogrel  75 mg Oral Daily  . diltiazem  120 mg Oral Daily  . docusate sodium  100 mg Oral Daily  . enoxaparin (LOVENOX) injection  40 mg Subcutaneous Q24H  . feeding supplement  237 mL Oral TID BM  .  folic acid  1 mg Oral Daily  . furosemide  40 mg Intravenous Q12H  . iron polysaccharides  150 mg Oral Q breakfast  . lisinopril  20 mg Oral Daily  . magnesium gluconate  500 mg Oral Daily  . methotrexate  7.5 mg Oral Weekly  . mupirocin ointment  1 application Nasal BID  . omega-3 acid ethyl esters  1 g Oral Daily  . pantoprazole  40 mg Oral Q1200  . potassium chloride SA  10 mEq Oral Daily  . potassium chloride  20 mEq Oral BID  . rosuvastatin  10 mg Oral QODAY  . sodium chloride  3 mL Intravenous Q12H  . spironolactone  12.5 mg Oral Daily  . triamcinolone ointment  1 application Topical BID  . vancomycin  500 mg Intravenous Q12H  . vitamin C  500 mg Oral Daily  . DISCONTD: atorvastatin  40 mg Oral q1800  . DISCONTD: carvedilol  25 mg Oral BID WC  . DISCONTD: isosorbide-hydrALAZINE  1 tablet Oral BID  . DISCONTD: lisinopril  10 mg Oral Daily  . DISCONTD: lisinopril  20 mg Oral Daily  . DISCONTD: NON FORMULARY 10 mg  10 mg Oral QODAY  . DISCONTD: potassium chloride SA  20 mEq Oral Daily  . DISCONTD: rosuvastatin  10 mg Oral QODAY   Continuous Infusions:   . DISCONTD: nitroGLYCERIN Stopped (03/20/12 1208)     [facility] Pager [phone]  If 7PM-7AM, please contact night-coverage [email or web address] Password [email or web address] 03/22/2012, 1:33 PM   LOS: 2 days

## 2012-03-22 NOTE — Progress Notes (Signed)
Subjective:  Up in chair  Objective:  Vital Signs in the last 24 hours: Temp:  [97.6 F (36.4 C)-98.1 F (36.7 C)] 97.6 F (36.4 C) (07/10 0700) Pulse Rate:  [64-76] 64  (07/10 0700) Resp:  [18-20] 18  (07/10 0700) BP: (155-183)/(68-82) 175/71 mmHg (07/10 0700) SpO2:  [96 %-98 %] 96 % (07/10 0700)  Intake/Output from previous day:  Intake/Output Summary (Last 24 hours) at 03/22/12 1046 Last data filed at 03/22/12 0700  Gross per 24 hour  Intake   1440 ml  Output   3000 ml  Net  -1560 ml    Physical Exam: General appearance: alert, cooperative, no distress and elderly, chronically ill Lungs: decreased breath sounds Heart: regular rate and rhythm   Rate: 65  Rhythm: normal sinus rhythm and PVCs  Lab Results:  Basename 03/22/12 0358 03/21/12 0242  WBC 17.1* 14.7*  HGB 10.3* 9.3*  PLT 259 219    Basename 03/22/12 0358 03/21/12 0242  NA 137 136  K 4.5 3.9  CL 99 98  CO2 32 31  GLUCOSE 89 135*  BUN 24* 23  CREATININE 0.70 0.79    Basename 03/21/12 0242 03/20/12 1916  TROPONINI <0.30 <0.30   Hepatic Function Panel No results found for this basename: PROT,ALBUMIN,AST,ALT,ALKPHOS,BILITOT,BILIDIR,IBILI in the last 72 hours No results found for this basename: CHOL in the last 72 hours No results found for this basename: INR in the last 72 hours  Imaging: Imaging results have been reviewed  Cardiac Studies:  Assessment/Plan:   Principal Problem:  *Acute on chronic diastolic CHF (congestive heart failure) Active Problems:  Hypertension, uncontrolled on adm 03/20/12  SOB (shortness of breath)  Paroxysmal atrial fibrillation, NSR on adm 03/20/12, poor Coumadin candidate secondary to falls  Chronic diastolic HF (heart failure)  HCAP (healthcare-associated pneumonia)  Leukocytosis  Stroke, acute, Rt brain, embolic March 2013  PVD, Lt CA stent 04/02/11   Plan- B/P is labile. Increase lisinopril to 20mg  and give in the afternoon starting today.  Decrease K+  supplement (K+ 4.5 today). MD to see later.   Corine Shelter PA-C 03/22/2012, 10:46 AM

## 2012-03-22 NOTE — Progress Notes (Signed)
Pt. Seen and examined. Agree with the NP/PA-C note as written.  Labile blood pressures.  Increasing lisinopril, but should aim for somewhat less control with systolic pressures in the 140's-150's instead of 120 to avoid hypotensive episodes. Hold K+ due to the addition of aldactone.  Chrystie Nose, MD, [facility] Attending Cardiologist [facility]

## 2012-03-22 NOTE — Evaluation (Signed)
Physical Therapy Evaluation Patient Details Name: Sara [name] MRN: [id number] DOB: 06/22/1919 Today's Date: 03/22/2012 Time: 1191-4782 PT Time Calculation (min): 23 min  PT Assessment / Plan / Recommendation Clinical Impression  78 y/o WF with decreased I with functional mobility and decreased activity tolerance.  Pt would benefit from skilled PT to work towards returning to PLOF. Recommend continued therapy at [facility].    PT Assessment  Patient needs continued PT services    Follow Up Recommendations  Skilled nursing facility    Barriers to Discharge        Equipment Recommendations  Defer to next venue    Recommendations for Other Services     Frequency Min 3X/week    Precautions / Restrictions   fall  Pertinent Vitals/Pain o2 96% on o2 after PT with some SOB, HR 66      Mobility  Bed Mobility Bed Mobility: Supine to Sit Supine to Sit: HOB elevated;4: Min assist;With rails Transfers Transfers: Sit to Stand Sit to Stand: 4: Min assist;From bed;3: Mod assist;From toilet Details for Transfer Assistance: cues for hand placement, MIN from bed, MOD A from toilet Ambulation/Gait Ambulation/Gait Assistance: 4: Min assist Ambulation Distance (Feet): 24 Feet Assistive device: Rolling walker Gait Pattern: Decreased step length - right;Decreased step length - left Gait velocity: WNL    Exercises     PT Diagnosis: Difficulty walking;Generalized weakness  PT Problem List: Decreased balance;Decreased mobility;Decreased activity tolerance PT Treatment Interventions: Gait training;DME instruction;Functional mobility training;Therapeutic activities;Therapeutic exercise;Balance training   PT Goals Acute Rehab PT Goals PT Goal Formulation: With patient Time For Goal Achievement: 03/29/12 Potential to Achieve Goals: Good Pt will go Supine/Side to Sit: with modified independence PT Goal: Supine/Side to Sit - Progress: Goal set today Pt will go Sit to Stand: with  supervision PT Goal: Sit to Stand - Progress: Goal set today Pt will go Stand to Sit: with supervision PT Goal: Stand to Sit - Progress: Goal set today Pt will Ambulate: 51 - 150 feet;with supervision;with rolling walker PT Goal: Ambulate - Progress: Goal set today Pt will Perform Home Exercise Program: with supervision, verbal cues required/provided PT Goal: Perform Home Exercise Program - Progress: Goal set today  Visit Information  Last PT Received On: 03/22/12 Assistance Needed: +1    Subjective Data  Subjective: "i need to go to the bathroom." Patient Stated Goal: To return to [facility]   Prior Functioning  Home Living Lives With: Alone (I living apartment.  had been 1 week post rehab) Available Help at Discharge: Skilled Nursing Facility Type of Home: Independent living facility Home Adaptive Equipment: Dan Humphreys - four wheeled Additional Comments: Pt had been at I living apartment at [facility] only 1week when admitted to hospital. Prior Function Level of Independence: Independent with assistive device(s) Driving: Yes    Cognition  Overall Cognitive Status: Appears within functional limits for tasks assessed/performed Arousal/Alertness: Awake/alert Orientation Level: Appears intact for tasks assessed Behavior During Session: [facility] for tasks performed    Extremity/Trunk Assessment Right Lower Extremity Assessment RLE ROM/Strength/Tone: [facility] for tasks assessed Left Lower Extremity Assessment LLE ROM/Strength/Tone: [facility] for tasks assessed   Balance    End of Session PT - End of Session Activity Tolerance: Patient limited by fatigue;Patient tolerated treatment well Patient left: in bed;with call bell/phone within reach;with nursing in room Nurse Communication: Mobility status;Other (comment) (SOB, o2 sats 96% HR 66)  GP     Warren Kugelman LUBECK 03/22/2012, 1:08 PM

## 2012-03-23 LAB — BASIC METABOLIC PANEL
CO2: 28 mEq/L (ref 19–32)
Chloride: 104 mEq/L (ref 96–112)
Creatinine, Ser: 0.69 mg/dL (ref 0.50–1.10)
Glucose, Bld: 90 mg/dL (ref 70–99)
Sodium: 137 mEq/L (ref 135–145)

## 2012-03-23 LAB — VANCOMYCIN, TROUGH: Vancomycin Tr: 9.3 ug/mL — ABNORMAL LOW (ref 10.0–20.0)

## 2012-03-23 MED ORDER — VANCOMYCIN HCL 1000 MG IV SOLR
750.0000 mg | Freq: Two times a day (BID) | INTRAVENOUS | Status: DC
  Administered 2012-03-24 (×2): 750 mg via INTRAVENOUS
  Filled 2012-03-23 (×2): qty 750

## 2012-03-23 NOTE — Progress Notes (Signed)
Chart review complete.  Patient is ineligible for [facility] Care Management services due to SNF level of care status.  For any additional questions or new referrals please contact Anibal Henderson BSN RN [facility] Liaison at [phone].

## 2012-03-23 NOTE — Progress Notes (Signed)
Subjective:   Objective: Vital signs in last 24 hours: Temp:  [97.8 F (36.6 C)-98.5 F (36.9 C)] 98.5 F (36.9 C) (07/11 1044) Pulse Rate:  [57-68] 57  (07/11 1044) Resp:  [18-20] 20  (07/11 1044) BP: (140-176)/(58-75) 140/58 mmHg (07/11 1044) SpO2:  [96 %-98 %] 97 % (07/11 1044) Weight:  [64.2 kg (141 lb 8.6 oz)] 64.2 kg (141 lb 8.6 oz) (07/11 0500) Weight change:  Last BM Date: 03/22/12 Intake/Output from previous day:-300 07/10 0701 - 07/11 0700 In: 510 [P.O.:360; IV Piggyback:150] Out: 950 [Urine:950] Intake/Output this shift: Total I/O In: 240 [P.O.:240] Out: 250 [Urine:250]  PE: General: Skin: diffuse rash, erythematous macular papular HEENT: [place]/AT Neck: no JVD Lungs: no wheezing Heart:  RRR 2/6 sem ZOX:WRUE Ext:no edema Neuro:nonfocal   Lab Results:  Basename 03/22/12 0358 03/21/12 0242  WBC 17.1* 14.7*  HGB 10.3* 9.3*  HCT 32.0* 28.3*  PLT 259 219   BMET  Basename 03/23/12 0400 03/22/12 0358  NA 137 137  K 4.3 4.5  CL 104 99  CO2 28 32  GLUCOSE 90 89  BUN 17 24*  CREATININE 0.69 0.70  CALCIUM 8.8 8.7    Basename 03/21/12 0242 03/20/12 1916  TROPONINI <0.30 <0.30    Lab Results  Component Value Date   CHOL 144 12/08/2011   HDL 44 12/08/2011   LDLCALC 83 12/08/2011   TRIG 86 12/08/2011   CHOLHDL 3.3 12/08/2011   Lab Results  Component Value Date   HGBA1C 6.1* 12/08/2011     Lab Results  Component Value Date   TSH 1.072 03/20/2012    Hepatic Function Panel No results found for this basename: PROT,ALBUMIN,AST,ALT,ALKPHOS,BILITOT,BILIDIR,IBILI in the last 72 hours No results found for this basename: CHOL in the last 72 hours No results found for this basename: PROTIME in the last 72 hours    EKG: Orders placed during the hospital encounter of 03/20/12  . ED EKG  . ED EKG  . EKG 12-LEAD  . EKG 12-LEAD  . EKG  . EKG 12-LEAD  . EKG 12-LEAD    Studies/Results:  2D Echo. Left ventricle: The cavity size was normal. Wall  thickness was increased in a pattern of mild LVH. There was mild concentric hypertrophy. Systolic function was normal. The estimated ejection fraction was in the range of 60% to 65%. Wall motion was normal; there were no regional wall motion abnormalities. Features are consistent with a pseudonormal left ventricular filling pattern, with concomitant abnormal relaxation and increased filling pressure (grade 2 diastolic dysfunction). Doppler parameters are consistent with elevated mean left atrial filling pressure. - Aortic valve: Trileaflet; mildly thickened, mildly calcified leaflets. There was mild stenosis. - Mitral valve: Moderately to severely calcified annulus. Moderately thickened, mildly calcified leaflets . Mild regurgitation. - Left atrium: The atrium was moderately dilated. - Right ventricle: Systolic pressure was increased. - Right atrium: The atrium was mildly dilated. - Atrial septum: No defect or patent foramen ovale was identified. - Pulmonary arteries: PA peak pressure: 55mm Hg (S). - Pericardium, extracardiac: There was a left pleural effusion. Impressions:  - The right ventricular systolic pressure was increased consistent with moderate pulmonary hypertension     Medications: I have reviewed the patient's current medications.    Marland Kitchen aspirin EC  81 mg Oral Daily  . carvedilol  12.5 mg Oral BID WC  . ceFEPime (MAXIPIME) IV  1 g Intravenous Q12H  . Chlorhexidine Gluconate Cloth  6 each Topical Q0600  . clopidogrel  75 mg Oral Daily  .  diltiazem  120 mg Oral Daily  . docusate sodium  100 mg Oral Daily  . enoxaparin (LOVENOX) injection  40 mg Subcutaneous Q24H  . feeding supplement  237 mL Oral TID BM  . folic acid  1 mg Oral Daily  . iron polysaccharides  150 mg Oral Q breakfast  . lisinopril  20 mg Oral Daily  . magnesium gluconate  500 mg Oral Daily  . methotrexate  7.5 mg Oral Weekly  . mupirocin ointment  1 application Nasal BID  . omega-3 acid ethyl  esters  1 g Oral Daily  . pantoprazole  40 mg Oral Q1200  . potassium chloride SA  10 mEq Oral Daily  . rosuvastatin  10 mg Oral QODAY  . sodium chloride  3 mL Intravenous Q12H  . spironolactone  12.5 mg Oral Daily  . triamcinolone ointment  1 application Topical BID  . vancomycin  500 mg Intravenous Q12H  . vitamin C  500 mg Oral Daily   Assessment/Plan: Patient Active Problem List  Diagnosis  . Leukocytosis  . Hypertension, uncontrolled on adm 03/20/12  . Paroxysmal atrial fibrillation, NSR on adm 03/20/12, poor Coumadin candidate secondary to falls  . Chronic diastolic HF (heart failure)  . Anemia  . Stroke, acute, Rt brain, embolic March 2013  . Cellulitis  . HCAP (healthcare-associated pneumonia)  . SOB (shortness of breath)  . Acute on chronic diastolic CHF (congestive heart failure)  . PVD, Lt CA stent 04/02/11   PLAN: Labile BP   LOS: 3 days   INGOLD,LAURA R 03/23/2012, 1:02 PM     Patient seen and examined. Agree with assessment and plan. BP now stable at 14/60. Sinus rhythm. Off Bidil. K 4.3, Cr normal with spirolactone addition.   Lennette Bihari, MD, [facility] 03/23/2012 2:04 PM

## 2012-03-23 NOTE — Progress Notes (Signed)
ANTIBIOTIC CONSULT NOTE - Follow Up  Pharmacy Consult for Vanco Indication: Suspected PNA  Allergies  Allergen Reactions  . Penicillins Other (See Comments)    unknown  . Triamterene Other (See Comments)    unknown  . Alendronate Sodium Rash    Patient Measurements: Height: 5\' 6"  (167.6 cm) Weight: 141 lb 8.6 oz (64.2 kg) IBW/kg (Calculated) : 59.3   Vital Signs: Temp: 98.4 F (36.9 C) (07/11 1417) Temp src: Oral (07/11 1417) BP: 164/72 mmHg (07/11 1417) Pulse Rate: 60  (07/11 1417) Intake/Output from previous day: 07/10 0701 - 07/11 0700 In: 510 [P.O.:360; IV Piggyback:150] Out: 950 [Urine:950] Intake/Output from this shift: Total I/O In: 480 [P.O.:480] Out: 250 [Urine:250]  Labs:  [facility] 03/23/12 0400 03/22/12 0358 03/21/12 0242  WBC -- 17.1* 14.7*  HGB -- 10.3* 9.3*  PLT -- 259 219  LABCREA -- -- --  CREATININE 0.69 0.70 0.79   Estimated Creatinine Clearance: 42.9 ml/min (by C-G formula based on Cr of 0.69).  Basename 03/23/12 1512  VANCOTROUGH 9.3*  VANCOPEAK --  Drue Dun --  GENTTROUGH --  GENTPEAK --  GENTRANDOM --  TOBRATROUGH --  TOBRAPEAK --  TOBRARND --  AMIKACINPEAK --  AMIKACINTROU --  AMIKACIN --     Microbiology: Recent Results (from the past 720 hour(s))  CULTURE, BLOOD (ROUTINE X 2)     Status: Normal (Preliminary result)   Collection Time   03/20/12  2:46 AM      Component Value Range Status Comment   Specimen Description BLOOD RIGHT ANTECUBITAL   Final    Special Requests BOTTLES DRAWN AEROBIC AND ANAEROBIC 5CC   Final    Culture  Setup Time 03/20/2012 10:49   Final    Culture     Final    Value:        BLOOD CULTURE RECEIVED NO GROWTH TO DATE CULTURE WILL BE HELD FOR 5 DAYS BEFORE ISSUING A FINAL NEGATIVE REPORT   Report Status PENDING   Incomplete   CULTURE, BLOOD (ROUTINE X 2)     Status: Normal (Preliminary result)   Collection Time   03/20/12  5:27 AM      Component Value Range Status Comment   Specimen Description  BLOOD LEFT FOREARM   Final    Special Requests BOTTLES DRAWN AEROBIC AND ANAEROBIC 5CC   Final    Culture  Setup Time 03/20/2012 10:49   Final    Culture     Final    Value:        BLOOD CULTURE RECEIVED NO GROWTH TO DATE CULTURE WILL BE HELD FOR 5 DAYS BEFORE ISSUING A FINAL NEGATIVE REPORT   Report Status PENDING   Incomplete   MRSA PCR SCREENING     Status: Abnormal   Collection Time   03/20/12  8:23 AM      Component Value Range Status Comment   MRSA by PCR POSITIVE (*) NEGATIVE Final   URINE CULTURE     Status: Normal   Collection Time   03/20/12  1:50 PM      Component Value Range Status Comment   Specimen Description URINE, RANDOM   Final    Special Requests NONE   Final    Culture  Setup Time 03/20/2012 21:49   Final    Colony Count NO GROWTH   Final    Culture NO GROWTH   Final    Report Status 03/22/2012 FINAL   Final     Medical History: Past Medical History  Diagnosis Date  . Hypertension   . Atrial fibrillation   . Psoriasis   . Congestive heart failure (CHF)   . Basal cell carcinoma   . History of CVA (cerebrovascular accident)   . Coronary artery disease   . Myocardial infarction   . CHF (congestive heart failure)   . Stroke   . Shortness of breath   . H/O hiatal hernia   . Closed fracture of head of left humerus 5/12    Medications:  Anti-infectives     Start     Dose/Rate Route Frequency Ordered Stop   03/21/12 1500   vancomycin (VANCOCIN) 500 mg in sodium chloride 0.9 % 100 mL IVPB        500 mg 100 mL/hr over 60 Minutes Intravenous Every 12 hours 03/21/12 1400     03/20/12 0430   vancomycin (VANCOCIN) IVPB 1000 mg/200 mL premix        1,000 mg 200 mL/hr over 60 Minutes Intravenous  Once 03/20/12 0407 03/20/12 0725   03/20/12 0430   ceFEPIme (MAXIPIME) 1 g in dextrose 5 % 50 mL IVPB        1 g 100 mL/hr over 30 Minutes Intravenous Every 12 hours 03/20/12 0410           Assessment:  78 yo F from SNF admitted 7/8 with CC: SOB x 3 days.  Currently on Day #3 of Vancomycin and Day #4 of cefepime for HCAP.  Vanc Trough subtherapeutic  Goal of Therapy:  Vancomycin trough level 15-20 mcg/ml  Plan:  Can expect further accumulation of vancomycin due to patient age of 78 yo. As a result, will increase dose of vancomycin slightly from 500mg  IV q12 to 750mg  IV q12. Recheck trough as necessary   Hessie Knows, PharmD, BCPS Pager [phone] 03/23/2012 4:45 PM

## 2012-03-23 NOTE — Progress Notes (Signed)
[facility] PROGRESS NOTE  Sara [name] WUJ:[id number] DOB: 12-17-1918 DOA: 03/20/2012 PCP: Londell Moh, MD  Assessment/Plan: Principal Problem:  *Acute on chronic diastolic CHF (congestive heart failure) Active Problems:  Leukocytosis  Hypertension, uncontrolled on adm 03/20/12  Paroxysmal atrial fibrillation, NSR on adm 03/20/12, poor Coumadin candidate secondary to falls  Chronic diastolic HF (heart failure)  Stroke, acute, Rt brain, embolic March 2013  HCAP (healthcare-associated pneumonia)  SOB (shortness of breath)  PVD, Lt CA stent 04/02/11   Acute on chronic diastolic heart failure -Appreciate cardiology help, patient improving clinically. -She is on IV Lasix, continue. ? Change to oral today. -Blood pressure was in the high side, probably was contributing to the diastolic CHF exacerbation. -Continue Coreg, Lasix and Aldactone, lisinopril added. -Cardiology please advise about discharge medication, as she expected to be discharged in the morning.  Healthcare associated pneumonia -Probable healthcare associated pneumonia, patient is afebrile now blood cultures are pending. -Patient is on cefepime and vancomycin, I will change to oral Levaquin  Paroxysmal atrial fibrillation -Heart rate is controlled, with some bradycardia. Patient is on Cardizem and Coreg. -Patient deemed a poor Coumadin candidate secondary to fall risk, continue aspirin and Plavix.  Leukocytosis -Patient does have chronic leukocytosis for at least the past 2 years. -She is at her baseline.  Hypertension -Pressure medication held yesterday secondary to borderline systolic blood pressure. -Labile blood pressure as it's gone up today. Cardiology is on just medications, lisinopril added.   Code Status: Full Family Communication: Remains inpatient Disposition Plan: To skilled nursing facility, likely in a.m.  Brief narrative: 78 year old Caucasian female with CHF and healthcare associated  pneumonia.  Consultants:  [facility]  Procedures:  2-D echocardiogram showed ejection fraction of about 63% and grade 2 diastolic dysfunction.  Antibiotics:  On cefepime and vancomycin  HPI/Subjective: She was sitting on chair when I saw her, she was very uncomfortable, as mild to moderate shortness of breath but denies any chest pain.  Objective: Filed Vitals:   03/22/12 2119 03/23/12 0500 03/23/12 0524 03/23/12 1044  BP: 168/75  176/73 140/58  Pulse: 68  59 57  Temp: 98.2 F (36.8 C)  97.8 F (36.6 C) 98.5 F (36.9 C)  TempSrc: Oral  Oral Oral  Resp: 18  20 20   Height:      Weight:  64.2 kg (141 lb 8.6 oz)    SpO2: 98%  98% 97%    Intake/Output Summary (Last 24 hours) at 03/23/12 1259 Last data filed at 03/23/12 1045  Gross per 24 hour  Intake    750 ml  Output   1050 ml  Net   -300 ml    Exam:  General: Alert and awake, oriented x3, not in any acute distress. HEENT: anicteric sclera, pupils reactive to light and accommodation, EOMI CVS: S1-S2 clear, no murmur rubs or gallops Chest: clear to auscultation bilaterally, no wheezing, rales or rhonchi Abdomen: soft nontender, nondistended, normal bowel sounds, no organomegaly Extremities: no cyanosis, clubbing or edema noted bilaterally Neuro: Cranial nerves II-XII intact, no focal neurological deficits  Data Reviewed: Basic Metabolic Panel:  Lab 03/23/12 9562 03/22/12 0358 03/21/12 0242 03/20/12 0230  NA 137 137 136 133*  K 4.3 4.5 3.9 4.4  CL 104 99 98 96  CO2 28 32 31 27  GLUCOSE 90 89 135* 218*  BUN 17 24* 23 17  CREATININE 0.69 0.70 0.79 0.65  CALCIUM 8.8 8.7 8.6 9.0  MG -- -- -- --  PHOS -- -- -- --  Liver Function Tests: No results found for this basename: AST:5,ALT:5,ALKPHOS:5,BILITOT:5,PROT:5,ALBUMIN:5 in the last 168 hours No results found for this basename: LIPASE:5,AMYLASE:5 in the last 168 hours No results found for this basename: AMMONIA:5 in the last 168  hours CBC:  Lab 03/22/12 0358 03/21/12 0242 03/20/12 0230  WBC 17.1* 14.7* 23.0*  NEUTROABS 9.0* 11.2* --  HGB 10.3* 9.3* 12.4  HCT 32.0* 28.3* 36.8  MCV 96.7 94.0 95.1  PLT 259 219 272   Cardiac Enzymes:  Lab 03/21/12 0242 03/20/12 1916 03/20/12 0840 03/20/12 0230  CKTOTAL 28 33 41 --  CKMB 2.1 2.2 2.2 --  CKMBINDEX -- -- -- --  TROPONINI <0.30 <0.30 <0.30 <0.30   BNP (last 3 results)  Basename 03/22/12 0358 03/21/12 0242 03/20/12 0230  PROBNP 2193.0* 4809.0* 3938.0*   CBG: No results found for this basename: GLUCAP:5 in the last 168 hours  Recent Results (from the past 240 hour(s))  CULTURE, BLOOD (ROUTINE X 2)     Status: Normal (Preliminary result)   Collection Time   03/20/12  2:46 AM      Component Value Range Status Comment   Specimen Description BLOOD RIGHT ANTECUBITAL   Final    Special Requests BOTTLES DRAWN AEROBIC AND ANAEROBIC 5CC   Final    Culture  Setup Time 03/20/2012 10:49   Final    Culture     Final    Value:        BLOOD CULTURE RECEIVED NO GROWTH TO DATE CULTURE WILL BE HELD FOR 5 DAYS BEFORE ISSUING A FINAL NEGATIVE REPORT   Report Status PENDING   Incomplete   CULTURE, BLOOD (ROUTINE X 2)     Status: Normal (Preliminary result)   Collection Time   03/20/12  5:27 AM      Component Value Range Status Comment   Specimen Description BLOOD LEFT FOREARM   Final    Special Requests BOTTLES DRAWN AEROBIC AND ANAEROBIC 5CC   Final    Culture  Setup Time 03/20/2012 10:49   Final    Culture     Final    Value:        BLOOD CULTURE RECEIVED NO GROWTH TO DATE CULTURE WILL BE HELD FOR 5 DAYS BEFORE ISSUING A FINAL NEGATIVE REPORT   Report Status PENDING   Incomplete   MRSA PCR SCREENING     Status: Abnormal   Collection Time   03/20/12  8:23 AM      Component Value Range Status Comment   MRSA by PCR POSITIVE (*) NEGATIVE Final   URINE CULTURE     Status: Normal   Collection Time   03/20/12  1:50 PM      Component Value Range Status Comment   Specimen  Description URINE, RANDOM   Final    Special Requests NONE   Final    Culture  Setup Time 03/20/2012 21:49   Final    Colony Count NO GROWTH   Final    Culture NO GROWTH   Final    Report Status 03/22/2012 FINAL   Final      Studies: No results found.  Scheduled Meds:    . aspirin EC  81 mg Oral Daily  . carvedilol  12.5 mg Oral BID WC  . ceFEPime (MAXIPIME) IV  1 g Intravenous Q12H  . Chlorhexidine Gluconate Cloth  6 each Topical Q0600  . clopidogrel  75 mg Oral Daily  . diltiazem  120 mg Oral Daily  . docusate sodium  100  mg Oral Daily  . enoxaparin (LOVENOX) injection  40 mg Subcutaneous Q24H  . feeding supplement  237 mL Oral TID BM  . folic acid  1 mg Oral Daily  . iron polysaccharides  150 mg Oral Q breakfast  . lisinopril  20 mg Oral Daily  . magnesium gluconate  500 mg Oral Daily  . methotrexate  7.5 mg Oral Weekly  . mupirocin ointment  1 application Nasal BID  . omega-3 acid ethyl esters  1 g Oral Daily  . pantoprazole  40 mg Oral Q1200  . potassium chloride SA  10 mEq Oral Daily  . rosuvastatin  10 mg Oral QODAY  . sodium chloride  3 mL Intravenous Q12H  . spironolactone  12.5 mg Oral Daily  . triamcinolone ointment  1 application Topical BID  . vancomycin  500 mg Intravenous Q12H  . vitamin C  500 mg Oral Daily   Continuous Infusions:    [facility] Pager [phone]  If 7PM-7AM, please contact night-coverage [email or web address] Password [email or web address] 03/23/2012, 12:59 PM   LOS: 3 days

## 2012-03-24 DIAGNOSIS — J9600 Acute respiratory failure, unspecified whether with hypoxia or hypercapnia: Secondary | ICD-10-CM

## 2012-03-24 DIAGNOSIS — K5900 Constipation, unspecified: Secondary | ICD-10-CM

## 2012-03-24 HISTORY — DX: Constipation, unspecified: K59.00

## 2012-03-24 LAB — BASIC METABOLIC PANEL
CO2: 25 mEq/L (ref 19–32)
Calcium: 9.2 mg/dL (ref 8.4–10.5)
Creatinine, Ser: 0.7 mg/dL (ref 0.50–1.10)
GFR calc non Af Amer: 73 mL/min — ABNORMAL LOW (ref >90)

## 2012-03-24 LAB — CBC
MCV: 96.9 fL (ref 78.0–100.0)
Platelets: 278 10*3/uL (ref 150–400)
RDW: 14.5 % (ref 11.5–15.5)
WBC: 13.3 10*3/uL — ABNORMAL HIGH (ref 4.0–10.5)

## 2012-03-24 LAB — PRO B NATRIURETIC PEPTIDE: Pro B Natriuretic peptide (BNP): 2150 pg/mL — ABNORMAL HIGH (ref 0–450)

## 2012-03-24 MED ORDER — SPIRONOLACTONE 12.5 MG HALF TABLET: 12.5000 mg | ORAL_TABLET | Freq: Every day | ORAL | Status: DC

## 2012-03-24 MED ORDER — OXYCODONE HCL 5 MG PO TABS: 5.0000 mg | ORAL_TABLET | ORAL | Status: DC

## 2012-03-24 MED ORDER — MOXIFLOXACIN HCL 400 MG PO TABS: 400.0000 mg | ORAL_TABLET | Freq: Every day | ORAL | Status: AC

## 2012-03-24 MED ORDER — LISINOPRIL 40 MG PO TABS: 40.0000 mg | ORAL_TABLET | Freq: Every day | ORAL | Status: DC

## 2012-03-24 NOTE — Progress Notes (Signed)
Occupational Therapy Treatment Patient Details Name: Sara [name] MRN: [id number] DOB: 06-02-1919 Today's Date: 03/24/2012 Time: 4098-1191 OT Time Calculation (min): 28 min  OT Assessment / Plan / Recommendation Comments on Treatment Session tolerated tx well but pt wanted to rest in bed until cardiologist came    Follow Up Recommendations  Skilled nursing facility    Barriers to Discharge       Equipment Recommendations  Defer to next venue    Recommendations for Other Services    Frequency Min 1X/week   Plan Discharge plan remains appropriate    Precautions / Restrictions Precautions Precautions: Fall   Pertinent Vitals/Pain Skin sensitive, sore, especially when pulling pants up    ADL  Grooming: Performed;Wash/dry hands;Teeth care;Applying makeup;Supervision/safety Where Assessed - Grooming: Supported standing Lower Body Dressing: Performed;Moderate assistance (pants, sit to stand:  more assist due to skin sensitivity) Toilet Transfer: Performed;Minimal assistance Toilet Transfer Method:  (ambulate) Toilet Transfer Equipment: Comfort height toilet;Grab bars Toileting - Clothing Manipulation and Hygiene: Performed;Minimal assistance Where Assessed - Toileting Clothing Manipulation and Hygiene: Standing ADL Comments: Pt getting ready to go home but cardiologist still coming. Pt had telemetry unit on.  Assisted with LB clothes only    OT Diagnosis:    OT Problem List:   OT Treatment Interventions:     OT Goals Acute Rehab OT Goals Time For Goal Achievement: 04/04/12 ADL Goals Pt Will Perform Grooming: with supervision;Standing at sink ADL Goal: Grooming - Progress: Met Pt Will Perform Lower Body Dressing: with supervision;Sit to stand from chair;Sit to stand from bed ADL Goal: Lower Body Dressing - Progress: Progressing toward goals Pt Will Transfer to Toilet: with supervision;Ambulation;with DME;3-in-1 ADL Goal: Toilet Transfer - Progress: Progressing toward  goals Pt Will Perform Toileting - Clothing Manipulation: with supervision;Standing ADL Goal: Toileting - Clothing Manipulation - Progress: Progressing toward goals  Visit Information  Last OT Received On: 03/24/12 Assistance Needed: +1    Subjective Data      Prior Functioning       Cognition  Overall Cognitive Status: Appears within functional limits for tasks assessed/performed Behavior During Session: [facility] for tasks performed    Mobility Bed Mobility Bed Mobility: Supine to Sit;Sit to Supine Supine to Sit: 4: Min assist;With rails Sit to Supine: 5: Supervision Details for Bed Mobility Assistance: verbal cues for technique, assist for trunk upright as pt attempted but unable Transfers Sit to Stand: 4: Min assist;With upper extremity assist;From bed Stand to Sit: 4: Min assist;To bed Details for Transfer Assistance: min vcs for hand placement   Exercises   Balance    End of Session OT - End of Session Activity Tolerance: Patient tolerated treatment well Patient left: in bed;with call bell/phone within reach  GO     Sara [name] 03/24/2012, 3:44 PM Marica Otter, OTR/L [phone] 03/24/2012

## 2012-03-24 NOTE — Discharge Summary (Signed)
Physician Discharge Summary  Sara [name] ZOX:[id number] DOB: 08-16-1919 DOA: 03/20/2012  PCP: Londell Moh, MD  Admit date: 03/20/2012 Discharge date: 03/24/2012  Recommendations for Outpatient Follow-up:  1. Follow BMP closely  Discharge Diagnoses:  Principal Problem:  *Acute on chronic diastolic CHF (congestive heart failure) Active Problems:  Leukocytosis  Hypertension, uncontrolled on adm 03/20/12  Paroxysmal atrial fibrillation, NSR on adm 03/20/12, poor Coumadin candidate secondary to falls  Chronic diastolic HF (heart failure)  Stroke, acute, Rt brain, embolic March 2013  HCAP (healthcare-associated pneumonia)  SOB (shortness of breath)  PVD, Lt CA stent 04/02/11  Acute respiratory failure   1. Acute on chronic diastolic CHF  Discharge Condition: Stable, to skilled nursing facility  Diet recommendation: Heart healthy diet  History of present illness:  Sara [name] is an 78 y.o. female from [facility] independent living facility presenting with complaints of worsening SOB X 3 days. When she arrived in the ED she had O2 sat in the 80s, and was hypertensive with a systolic BP of 223. She was placed on BiPAP, and a IV NTG drip and had Given IV 60 mg IV lasix X 1 and had improvement and was weaned off the BiPAP. Her O2 saturation improved 96%. She denies having any chest pain, fevers chills or cough.   Hospital Course:   1. Acute on chronic diastolic CHF: After admission, cardiology was consulted. Patient did have hypertensive urgency at the time of presentation to the emergency department with systolic blood pressure of of 811. Patient was placed on nitroglycerin drip and is given Lasix, initially in the emergency department she was on BiPAP with oxygen saturation in the 80s. After these measures she weaned off the BiPAP quickly to the nasal cannula. Aggressive diuresis was instituted with IV Lasix, cardiology was managing meds, Aldactone was added at low dose of 12.5. Other  hypertension medications was continued including Coreg 25 mg, she was on lisinopril which increased to 40 mg. Patient improved very well and she was able to be on room air before discharge, patient needs close followup in terms of her renal function and electrolytes.  2. Acute respiratory failure: Required noninvasive mechanical ventilation (BiPAP) for brief time, patient was presented with oxygen saturation in the 80s and hypertensive urgency. She was placed on BiPAP and IV nitroglycerin infusion. This is resolved, as mentioned above at the time of discharge patient was on room air.  3. Healthcare associated pneumonia: Patient radiological studies showed probable healthcare associated pneumonia, patient was started on broad-spectrum antibiotics namely cefepime and vancomycin at the time of admission, she is been consistently afebrile. At the time of discharge Avelox was added for 5 more days.  4. Hypertensive urgency: As mentioned above the time of presentation patient systolic blood pressure was 223, patient required nitroglycerin drip. Cardiology was helping on titrating blood pressure medications, today next admission blood pressure went down to the 90s. The pressure medication was adjusted, Bidil was discontinued and lisinopril increased to 40 mg, spironolactone was added at 12.5 mg. Because of lisinopril dose increased as well as addition of spironolactone, supplemental potassium was discontinued. Blood pressure was still in the high side at the time of discharge, but it was acceptable. Cardiology recommended to target systolic blood pressure of 135-145 because of tendency to get low blood pressure. Blood pressure medication might need further adjustment as outpatient.  5. Paroxysmal atrial fibrillation: Patient heart rate is controlled, patient is on Cardizem and Coreg. She is poor Coumadin candidate secondary to fall  risk continue aspirin and Plavix.  6. Leukocytosis: Patient has chronically  elevated WBC since February of 2012, differential was swinging between neutrophilia and lymphocytosis. This needs outpatient followup  Procedures:  Echocardiogram on 03/20/2012 showed LVEF of 63% and grade 2 diastolic dysfunction  Consultations:  Cardiology, Nicki [place]  Discharge Exam: Filed Vitals:   03/24/12 0540  BP: 170/75  Pulse: 64  Temp: 98 F (36.7 C)  Resp: 20   Filed Vitals:   03/23/12 1044 03/23/12 1417 03/23/12 2215 03/24/12 0540  BP: 140/58 164/72 183/74 170/75  Pulse: 57 60 63 64  Temp: 98.5 F (36.9 C) 98.4 F (36.9 C) 98.1 F (36.7 C) 98 F (36.7 C)  TempSrc: Oral Oral Oral Oral  Resp: 20 20 20 20   Height:      Weight:    64.3 kg (141 lb 12.1 oz)  SpO2: 97% 94% 96% 96%   General: Alert and awake, oriented x3, not in any acute distress. HEENT: anicteric sclera, pupils reactive to light and accommodation, EOMI CVS: S1-S2 clear, no murmur rubs or gallops Chest: clear to auscultation bilaterally, no wheezing, rales or rhonchi Abdomen: soft nontender, nondistended, normal bowel sounds, no organomegaly Extremities: no cyanosis, clubbing or edema noted bilaterally Neuro: Cranial nerves II-XII intact, no focal neurological deficits   Discharge Instructions  Discharge Orders    Future Appointments: Provider: Department: Dept Phone: Center:   04/03/2012 10:00 AM Radene Gunning [facility] [phone] None   04/03/2012 10:30 AM Levert Feinstein, MD [facility] [phone] None     Future Orders Please Complete By Expires   Diet - low sodium heart healthy      Increase activity slowly        Medication List  As of 03/24/2012 12:46 PM   STOP taking these medications         isosorbide-hydrALAZINE 20-37.5 MG per tablet      potassium chloride SA 20 MEQ tablet         TAKE these medications         acetaminophen 500 MG tablet   Commonly known as: TYLENOL   Take 500 mg by mouth every 4 (four) hours as needed. Pain.      aspirin EC 81 MG  tablet   Take 81 mg by mouth daily.      carvedilol 25 MG tablet   Commonly known as: COREG   Take 1 tablet (25 mg total) by mouth 2 (two) times daily with a meal.      clopidogrel 75 MG tablet   Commonly known as: PLAVIX   Take 75 mg by mouth daily.      Co Q 10 100 MG Caps   Take 1 capsule by mouth daily.      diltiazem 120 MG 24 hr capsule   Commonly known as: CARDIZEM CD   Take 120 mg by mouth daily.      feeding supplement Liqd   Take 237 mLs by mouth 3 (three) times daily between meals.      Fish Oil 1000 MG Caps   Take 2,000 mg by mouth daily.      folic acid 1 MG tablet   Commonly known as: FOLVITE   Take 1 mg by mouth daily.      furosemide 40 MG tablet   Commonly known as: LASIX   Take 40 mg by mouth every morning.      HYLATOPIC Foam   Apply 1 application topically 2 (two) times daily as  needed. Psoriasis.      iron polysaccharides 150 MG capsule   Commonly known as: NIFEREX   Take 1 capsule (150 mg total) by mouth daily with breakfast.      lisinopril 40 MG tablet   Commonly known as: PRINIVIL,ZESTRIL   Take 1 tablet (40 mg total) by mouth daily.      MAGNESIUM PO   Take 1 tablet by mouth daily. 500mg       methotrexate 2.5 MG tablet   Take 7.5 mg by mouth once a week. She takes on Saturdays.      moxifloxacin 400 MG tablet   Commonly known as: AVELOX   Take 1 tablet (400 mg total) by mouth daily.      oxyCODONE 5 MG immediate release tablet   Commonly known as: Oxy IR/ROXICODONE   Take 1-2 tablets (5-10 mg total) by mouth See admin instructions. Take 5mg  by mouth every 4 hours as needed for mild pain. Take 10mg  by mouth every 4 hours as needed for severe pain.      pantoprazole 40 MG tablet   Commonly known as: PROTONIX   Take 40 mg by mouth daily.      rosuvastatin 20 MG tablet   Commonly known as: CRESTOR   Take 10 mg by mouth every other day.      spironolactone 12.5 mg Tabs   Commonly known as: ALDACTONE   Take 0.5 tablets (12.5 mg  total) by mouth daily.      STOOL SOFTENER PO   Take 1 capsule by mouth daily.      triamcinolone ointment 0.1 %   Commonly known as: KENALOG   Apply 1 application topically 2 (two) times daily. To legs and feet for psoriasis.      vitamin C 500 MG tablet   Commonly known as: ASCORBIC ACID   Take 500 mg by mouth daily.           Follow-up Information    Follow up with GREEN, Lenon Curt, MD in 1 week.   Contact information:   [address] [place] [address] [phone]           The results of significant diagnostics from this hospitalization (including imaging, microbiology, ancillary and laboratory) are listed below for reference.    Significant Diagnostic Studies: US Venous Img Lower Unilateral Left  03/01/2012  *RADIOLOGY REPORT*  Clinical Data: Left leg pain and swelling.  LEFT LOWER EXTREMITY VENOUS DUPLEX ULTRASOUND  Technique:  Gray-scale sonography with graded compression, as well as color Doppler and duplex ultrasound were performed to evaluate the deep venous system of the lower extremity from the level of the common femoral vein through the popliteal and proximal calf veins. Spectral Doppler was utilized to evaluate flow at rest and with distal augmentation maneuvers.  Comparison:  None.  Findings:  Normal compressibility of the common femoral, superficial femoral, and popliteal veins is demonstrated, as well as the visualized proximal calf veins.  No filling defects to suggest DVT on grayscale or color Doppler imaging.  Doppler waveforms show normal direction of venous flow, normal respiratory phasicity and response to augmentation.  There is a fluid collection in the popliteal space, measuring 3.6 x 2.6 x 1.0 cm in maximum dimensions.  IMPRESSION:  1.  No evidence of left lower extremity deep vein thrombosis. 2.  3.6 cm popliteal cyst.  Original Report Authenticated By: Darrol Angel, M.D.   Dg Chest Portable 1 View  03/20/2012  *RADIOLOGY REPORT*  Clinical Data: Shortness of breath and weakness.  PORTABLE CHEST - 1 VIEW  Comparison: Chest radiograph performed 01/12/2012  Findings: The lungs are well-aerated.  Vascular congestion is noted, with bilateral central and bibasilar airspace opacities, concerning for pulmonary edema.  Superimposed pneumonia is a concern.  There is no evidence of pleural effusion or pneumothorax.  The cardiomediastinal silhouette is mildly enlarged; calcification is noted within the aortic arch.  No acute osseous abnormalities are seen.  There is stable likely chronic deformity of the left humeral neck.  IMPRESSION:  1.  Vascular congestion and mild cardiomegaly, with bilateral central and bibasilar airspace opacities, concerning for pulmonary edema.  Superimposed pneumonia is a concern. 2.  Stable likely chronic deformity of the left humeral neck.  Original Report Authenticated By: Tonia Ghent, M.D.    Microbiology: Recent Results (from the past 240 hour(s))  CULTURE, BLOOD (ROUTINE X 2)     Status: Normal (Preliminary result)   Collection Time   03/20/12  2:46 AM      Component Value Range Status Comment   Specimen Description BLOOD RIGHT ANTECUBITAL   Final    Special Requests BOTTLES DRAWN AEROBIC AND ANAEROBIC 5CC   Final    Culture  Setup Time 03/20/2012 10:49   Final    Culture     Final    Value:        BLOOD CULTURE RECEIVED NO GROWTH TO DATE CULTURE WILL BE HELD FOR 5 DAYS BEFORE ISSUING A FINAL NEGATIVE REPORT   Report Status PENDING   Incomplete   CULTURE, BLOOD (ROUTINE X 2)     Status: Normal (Preliminary result)   Collection Time   03/20/12  5:27 AM      Component Value Range Status Comment   Specimen Description BLOOD LEFT FOREARM   Final    Special Requests BOTTLES DRAWN AEROBIC AND ANAEROBIC 5CC   Final    Culture  Setup Time 03/20/2012 10:49   Final    Culture     Final    Value:        BLOOD CULTURE RECEIVED NO GROWTH TO DATE CULTURE WILL BE HELD FOR 5 DAYS BEFORE ISSUING A FINAL NEGATIVE  REPORT   Report Status PENDING   Incomplete   MRSA PCR SCREENING     Status: Abnormal   Collection Time   03/20/12  8:23 AM      Component Value Range Status Comment   MRSA by PCR POSITIVE (*) NEGATIVE Final   URINE CULTURE     Status: Normal   Collection Time   03/20/12  1:50 PM      Component Value Range Status Comment   Specimen Description URINE, RANDOM   Final    Special Requests NONE   Final    Culture  Setup Time 03/20/2012 21:49   Final    Colony Count NO GROWTH   Final    Culture NO GROWTH   Final    Report Status 03/22/2012 FINAL   Final      Labs: Basic Metabolic Panel:  Lab 03/24/12 1610 03/23/12 0400 03/22/12 0358 03/21/12 0242 03/20/12 0230  NA 137 137 137 136 133*  K 4.4 4.3 4.5 3.9 4.4  CL 104 104 99 98 96  CO2 25 28 32 31 27  GLUCOSE 96 90 89 135* 218*  BUN 14 17 24* 23 17  CREATININE 0.70 0.69 0.70 0.79 0.65  CALCIUM 9.2 8.8 8.7 8.6 9.0  MG -- -- -- -- --  PHOS -- -- -- -- --  Liver Function Tests: No results found for this basename: AST:5,ALT:5,ALKPHOS:5,BILITOT:5,PROT:5,ALBUMIN:5 in the last 168 hours No results found for this basename: LIPASE:5,AMYLASE:5 in the last 168 hours No results found for this basename: AMMONIA:5 in the last 168 hours CBC:  Lab 03/24/12 0400 03/22/12 0358 03/21/12 0242 03/20/12 0230  WBC 13.3* 17.1* 14.7* 23.0*  NEUTROABS -- 9.0* 11.2* --  HGB 12.1 10.3* 9.3* 12.4  HCT 37.0 32.0* 28.3* 36.8  MCV 96.9 96.7 94.0 95.1  PLT 278 259 219 272   Cardiac Enzymes:  Lab 03/21/12 0242 03/20/12 1916 03/20/12 0840 03/20/12 0230  CKTOTAL 28 33 41 --  CKMB 2.1 2.2 2.2 --  CKMBINDEX -- -- -- --  TROPONINI <0.30 <0.30 <0.30 <0.30   BNP: BNP (last 3 results)  Basename 03/24/12 0400 03/22/12 0358 03/21/12 0242  PROBNP 2150.0* 2193.0* 4809.0*   CBG: No results found for this basename: GLUCAP:5 in the last 168 hours  Time coordinating discharge: 40 minutes  Signed:  Shaylon Aden A  [facility] 03/24/2012, 12:46  PM

## 2012-03-24 NOTE — Progress Notes (Signed)
Patient was discharged before I was able to see her. Follow-up is planned in our office.  Chrystie Nose, MD, [facility] Attending Cardiologist [facility]

## 2012-03-24 NOTE — Progress Notes (Signed)
Physical Therapy Treatment Patient Details Name: Sara [name] MRN: [id number] DOB: November 02, 1918 Today's Date: 03/24/2012 Time: 4098-1191 PT Time Calculation (min): 16 min  PT Assessment / Plan / Recommendation Comments on Treatment Session  Pt reports increased fatigue after ambulation and exercises.  SaO2 99% room air pregait, 91% room air upon return to room, and 95% room air upon leaving room.    Follow Up Recommendations  Skilled nursing facility    Barriers to Discharge        Equipment Recommendations  Defer to next venue    Recommendations for Other Services    Frequency     Plan Discharge plan remains appropriate;Frequency remains appropriate    Precautions / Restrictions Precautions Precautions: Fall   Pertinent Vitals/Pain No pain    Mobility  Bed Mobility Bed Mobility: Supine to Sit;Sit to Supine Supine to Sit: 4: Min assist;With rails Sit to Supine: 5: Supervision Details for Bed Mobility Assistance: verbal cues for technique, assist for trunk upright as pt attempted but unable Transfers Transfers: Stand to Sit;Sit to Stand Sit to Stand: 4: Min assist;With upper extremity assist;From bed Stand to Sit: 4: Min assist;To bed Details for Transfer Assistance: verbal cues for safety and hand placement, assist for weakness and controlling descent Ambulation/Gait Ambulation/Gait Assistance: 4: Min assist Ambulation Distance (Feet): 100 Feet Assistive device: Rolling walker Ambulation/Gait Assistance Details: occasional assist to steady, pt reports she is used to rollator Gait Pattern: Step-through pattern;Decreased stride length    Exercises General Exercises - Lower Extremity Long Arc Quad: AROM;Strengthening;Both;10 reps;Seated Hip Flexion/Marching: AROM;Strengthening;Both;20 reps;Seated Heel Raises: AROM;Strengthening;Both;10 reps;Standing;Other (comment) (with RW) Mini-Sqauts: AROM;Strengthening;Both;10 reps;Standing;Other (comment) (with RW)   PT Diagnosis:     PT Problem List:   PT Treatment Interventions:     PT Goals Acute Rehab PT Goals PT Goal: Supine/Side to Sit - Progress: Progressing toward goal PT Goal: Sit to Stand - Progress: Progressing toward goal PT Goal: Stand to Sit - Progress: Progressing toward goal PT Goal: Ambulate - Progress: Progressing toward goal PT Goal: Perform Home Exercise Program - Progress: Progressing toward goal  Visit Information  Last PT Received On: 03/24/12 Assistance Needed: +1    Subjective Data  Subjective: "I've gotten so weak."   Cognition  Overall Cognitive Status: Appears within functional limits for tasks assessed/performed    Balance     End of Session PT - End of Session Activity Tolerance: Patient limited by fatigue Patient left: in bed;with call bell/phone within reach   GP     [facility] E 03/24/2012, 12:51 PM Pager: [phone]

## 2012-03-24 NOTE — Progress Notes (Signed)
Subjective:  Sara [name] is a pleasant 78 yo F with PMH of CAD, HTN, Afib, and diastolic CHF who was referred for Cardiology consult for medical recommendations on acute on chronic diastolic CHF and labile BP and an extensive cardiovascular problem list.  Patient's SBP is elevated to 180s today.  She denies any new symptoms since our last visit.  She slept well last evening and has not perceived a difference with the return of her HTN.  Confirms shortness of breath with conversation, describes this as chronic.  Concerned about skin lesions over arms and legs, whether she needs more bandages from leaking areas.   Diagnosis  . Hypertension  . Atrial fibrillation  . Psoriasis  . Congestive heart failure (CHF)  . Basal cell carcinoma  . History of CVA (cerebrovascular accident)  . Coronary artery disease  . Myocardial infarction  . CHF (congestive heart failure)  . Stroke  . Shortness of breath  . H/O hiatal hernia  . Closed fracture of head of left humerus   Objective:  Vital Signs in the last 24 hours: Temp:  [98 F (36.7 C)-98.4 F (36.9 C)] 98 F (36.7 C) (07/12 0540) Pulse Rate:  [60-64] 64  (07/12 0540) Resp:  [20] 20  (07/12 0540) BP: (164-183)/(72-75) 170/75 mmHg (07/12 0540) SpO2:  [94 %-96 %] 96 % (07/12 0540) Weight:  [64.3 kg (141 lb 12.1 oz)] 64.3 kg (141 lb 12.1 oz) (07/12 0540)  Intake/Output from previous day: 07/11 0701 - 07/12 0700 In: 1290 [P.O.:940; IV Piggyback:350] Out: 1900 [Urine:1900] Intake/Output from this shift: Total I/O In: 360 [P.O.:120; Other:240] Out: 650 [Urine:650]  Physical Exam: General appearance: alert, cooperative, appears stated age, no distress and with some SOB with talking during interview Neck: no JVD, supple, symmetrical, trachea midline and Carotid bruit auscultated on Right. Lungs: clear to auscultation bilaterally Heart: regular rate and rhythm, S1, S2 normal and possible systolic murmur best heard in 2nd heart space Abdomen: soft,  non-tender; bowel sounds normal; no masses,  no organomegaly Extremities: venous stasis dermatitis noted and chornic arterial insufficiency changes noted with significant bruising and varicoties over arms bilaterally, one area of erythema with a fluctuant pocket on L arm beneath antecubital area. Pulses: Left Posterior Tibial present 1+ Right Posterior Tibial present 2+ Skin: ecchymoses - generalized, arm(s) bilateral, lower leg(s) bilateral, erythema - arm(s) bilateral, varicosities - generalized and see extremities above Neurologic: Mental status: Alert, oriented, thought content appropriate Cranial nerves: normal Motor: grossly normal   Lab Results:  Basename 03/24/12 0400 03/22/12 0358  WBC 13.3* 17.1*  HGB 12.1 10.3*  PLT 278 259    Basename 03/24/12 0400 03/23/12 0400  NA 137 137  K 4.4 4.3  CL 104 104  CO2 25 28  GLUCOSE 96 90  BUN 14 17  CREATININE 0.70 0.69   No cardiac enzymes performed during this admission. BNP (last 3 results)  Basename 03/24/12 0400 03/22/12 0358 03/21/12 0242  PROBNP 2150.0* 2193.0* 4809.0*   Imaging:  *RADIOLOGY REPORT*   Clinical Data: Shortness of breath and weakness.   PORTABLE CHEST - 1 VIEW   Comparison: Chest radiograph performed 01/12/2012   Findings: The lungs are well-aerated.  Vascular congestion is noted, with bilateral central and bibasilar airspace opacities, concerning for pulmonary edema.  Superimposed pneumonia is a concern.  There is no evidence of pleural effusion or pneumothorax.   The cardiomediastinal silhouette is mildly enlarged; calcification is noted within the aortic arch.  No acute osseous abnormalities are seen.  There  is stable likely chronic deformity of the left humeral neck.   IMPRESSION:   1.  Vascular congestion and mild cardiomegaly, with bilateral central and bibasilar airspace opacities, concerning for pulmonary edema.  Superimposed pneumonia is a concern. 2.  Stable likely chronic  deformity of the left humeral neck.   Original Report Authenticated By: Tonia Ghent, M.D.  Cardiac Studies: 2D Echocardiogram with contrast 03/20/12  Study Conclusions  - Procedure narrative: Transthoracic echocardiography. Image   quality was adequate. The study was technically difficult. - Left ventricle: The cavity size was normal. Wall thickness   was increased in a pattern of mild LVH. There was mild   concentric hypertrophy. Systolic function was normal. The   estimated ejection fraction was in the range of 60% to   65%. Wall motion was normal; there were no regional wall   motion abnormalities. Features are consistent with a   pseudonormal left ventricular filling pattern, with   concomitant abnormal relaxation and increased filling   pressure (grade 2 diastolic dysfunction). Doppler   parameters are consistent with elevated mean left atrial   filling pressure. - Aortic valve: Trileaflet; mildly thickened, mildly   calcified leaflets. There was mild stenosis. - Mitral valve: Moderately to severely calcified annulus.   Moderately thickened, mildly calcified leaflets . Mild   regurgitation. - Left atrium: The atrium was moderately dilated. - Right ventricle: Systolic pressure was increased. - Right atrium: The atrium was mildly dilated. - Atrial septum: No defect or patent foramen ovale was   identified. - Pulmonary arteries: PA peak pressure: 55mm Hg (S). - Pericardium, extracardiac: There was a left pleural   effusion. Impressions:  - The right ventricular systolic pressure was increased   consistent with moderate pulmonary hypertension. Transthoracic echocardiography.  M-mode, complete 2D, spectral Doppler, and color Doppler.  Blood pressure: 158/66.  Patient status:  Inpatient.  Location:  Bedside. Assessment/Plan:  Cardiovascular Problem List Acute on chronic diastolic heart failure -Sinus rhythm  Tolerating spironolactone well with K+ stable around 4.4, SCr  stable and WNR.  Spiked elevation in in blood pressure to SBP 180s today may further exacerbate diastolic CHF.  BNP improved from 03/21/12 dropping 2000pt and maintaining.   Diltiazem is being given.  Paroxysmal atrial fibrillation -Heart rate is still controlled, with some bradycardia. Patient is on Cardizem and Coreg. -Patient deemed a poor Coumadin candidate secondary to fall risk. -Continue ASA, clopidogrel and lovenox already on board. Hypertension BP has been labile and today is staying elevated with SBP in 180s, diastolic is in 70s-80s despite addition of lisinopril.  Carvedilol dose currently 12.5 PO BID.  Would resume Bi Dil for diastolic dysfunction and  for acute elevation in BP.  MD to see prior to d/c by attending team possibly today.    LOS: 4 days    Lakaisha Danish R 03/24/2012, 12:47 PM

## 2012-03-24 NOTE — Progress Notes (Addendum)
Patient set to discharge back to [facility] today. Patient, daughter & son-in-law, Nadine Counts aware. Nadine Counts to transport back to facility @ 4pm today.   Unice Bailey, LCSW [facility] Clinical Social Worker cell #: [phone]   Clinical Social Work Department CLINICAL SOCIAL WORK PLACEMENT NOTE 03/24/2012  Patient:  Sramek,Zavia A  Account Number:  [id number] Admit date:  03/20/2012  Clinical Social Worker:  Orpah Greek  Date/time:  03/22/2012 02:48 PM  Clinical Social Work is seeking post-discharge placement for this patient at the following level of care:   SKILLED NURSING   (*CSW will update this form in Epic as items are completed)     Patient/family provided with [facility] Department of Clinical Social Work's list of facilities offering this level of care within the geographic area requested by the patient (or if unable, by the patient's family).    Patient/family informed of their freedom to choose among providers that offer the needed level of care, that participate in Medicare, Medicaid or managed care program needed by the patient, have an available bed and are willing to accept the patient.    Patient/family informed of [facility]' ownership interest in [facility], as well as of the fact that they are under no obligation to receive care at this facility.  PASARR submitted to EDS on 03/22/2012 PASARR number received from [employer] on 03/22/2012  FL2 transmitted to all facilities in geographic area requested by pt/family on  03/22/2012 FL2 transmitted to all facilities within larger geographic area on   Patient informed that his/her managed care company has contracts with or will negotiate with  certain facilities, including the following:     Patient/family informed of bed offers received:  03/22/2012 Patient chooses bed at [facility] Physician recommends and patient chooses bed at    Patient to be transferred to  [facility] [place] on  03/24/2012 Patient to be transferred to facility by Son-in-law, Bob's car  The following physician request were entered in Epic:   Additional Comments:

## 2012-03-24 NOTE — Progress Notes (Signed)
Ok for pt to be discharged back to [facility] per cardiology, Corine Shelter, [place].  Pt will be seen in the office next week.  Otho Perl, RN

## 2012-03-26 DIAGNOSIS — M7120 Synovial cyst of popliteal space [Baker], unspecified knee: Secondary | ICD-10-CM

## 2012-03-26 HISTORY — DX: Synovial cyst of popliteal space (Baker), unspecified knee: M71.20

## 2012-03-26 LAB — CULTURE, BLOOD (ROUTINE X 2): Culture: NO GROWTH

## 2012-04-03 ENCOUNTER — Other Ambulatory Visit (HOSPITAL_BASED_OUTPATIENT_CLINIC_OR_DEPARTMENT_OTHER): Payer: Medicare Other | Admitting: Lab

## 2012-04-03 ENCOUNTER — Ambulatory Visit (HOSPITAL_BASED_OUTPATIENT_CLINIC_OR_DEPARTMENT_OTHER): Payer: Medicare Other | Admitting: Oncology

## 2012-04-03 ENCOUNTER — Other Ambulatory Visit (HOSPITAL_COMMUNITY)
Admission: RE | Admit: 2012-04-03 | Discharge: 2012-04-03 | Disposition: A | Discharge status: Home / Self Care | Payer: Medicare Other | Source: Ambulatory Visit | Attending: Oncology | Admitting: Oncology

## 2012-04-03 VITALS — BP 154/70 | HR 67 | Temp 98.7°F | Ht 66.0 in | Wt 137.0 lb

## 2012-04-03 DIAGNOSIS — D72829 Elevated white blood cell count, unspecified: Secondary | ICD-10-CM | POA: Insufficient documentation

## 2012-04-03 LAB — CBC WITH DIFFERENTIAL/PLATELET
BASO%: 0.4 % (ref 0.0–2.0)
Basophils Absolute: 0.1 10*3/uL (ref 0.0–0.1)
EOS%: 1.8 % (ref 0.0–7.0)
HGB: 11.8 g/dL (ref 11.6–15.9)
MCH: 31.3 pg (ref 25.1–34.0)
MCHC: 32.2 g/dL (ref 31.5–36.0)
RDW: 14.5 % (ref 11.2–14.5)
lymph#: 6 10*3/uL — ABNORMAL HIGH (ref 0.9–3.3)

## 2012-04-03 NOTE — Progress Notes (Signed)
Hematology and Oncology Follow Up Visit  Sara [name] [id number] [birth year] 78 y.o. 04/03/2012 7:43 PM   Principle Diagnosis:No diagnosis found.   Interim History:  Followup visit for this pleasant 78 year old woman I evaluated you back in May for mild anemia and leukocytosis. Please see my office consult note dated 02/02/2012 for full details. Of importance is the fact that she has chronic, extensive, psoriasis with a chronic, diffuse, erythematous rash. She was on Humira in the past through January of this year and is currently on oral methotrexate. Highest white count recorded has been 19,500. Differential count shows a shift towards lymphocytes. Some of the lymphocytes on review of her peripheral blood film were atypical with coffee been nuclei and hair like projections. Physical exam remarkable for a single right axillary lymph node. No splenomegaly. Mild normochromic anemia with hemoglobin 12 g. Normal platelet count. I felt that at least part of her leukocytosis was related to chronic inflammation associated with her psoriasis. I could not rule out early chronic lymphocytic leukemia or hairy cell leukemia. I planned to do a flow cytometry analysis of her peripheral blood but this got overlooked.  She just got out of the hospital again last week. She tells me  that this is her third hospitalization this year related to atrial fibrillation and congestive heart failure. I was surprised that her counts were as good as they were today. Hemoglobin remains stable at 11.8 g, white count is 14,000 with 52% neutrophils 42% lymphocytes, platelets 289,000.  Medications: reviewed  Allergies:  Allergies  Allergen Reactions  . Penicillins Other (See Comments)    unknown  . Triamterene Other (See Comments)    unknown  . Alendronate Sodium Rash    Review of Systems: Constitutional:   No constitutional symptoms Respiratory: No cough or dyspnea Cardiovascular:  See above Gastrointestinal: No change  in bowel habit Genito-Urinary: No urinary tract symptoms Musculoskeletal: No muscle or bone pain Neurologic: No headache or change in vision Skin: No rash or ecchymosis Remaining ROS negative.  Physical Exam: Blood pressure 154/70, pulse 67, temperature 98.7 F (37.1 C), temperature source Oral, height 5\' 6"  (1.676 m), weight 137 lb (62.143 kg). Wt Readings from Last 3 Encounters:  04/03/12 137 lb (62.143 kg)  03/24/12 141 lb 12.1 oz (64.3 kg)  02/02/12 139 lb (63.05 kg)     General appearance: Thin Caucasian woman HENNT: Pharynx no erythema exudate or mass Lymph nodes: Persistent right axillary lymph node approximately 2-3 cm unchanged from a prior exam. No other areas of adenopathy. Breasts: Not examined Lungs: Clear to auscultation resonant to percussion Heart: Regular rhythm 2-3/6 systolic murmur at the left sternal border Abdomen: Soft nontender no mass no organomegaly Extremities: No edema no calf tenderness Vascular: No cyanosis Neurologic: No focal deficit Skin: Diffuse erythema of the skin of her trunk and abdomen and extremities  Lab Results: Lab Results  Component Value Date   WBC 14.4* 04/03/2012   HGB 11.8 04/03/2012   HCT 36.6 04/03/2012   MCV 97.3 04/03/2012   PLT 289 04/03/2012     Chemistry      Component Value Date/Time   NA 137 03/24/2012 0400   K 4.4 03/24/2012 0400   CL 104 03/24/2012 0400   CO2 25 03/24/2012 0400   BUN 14 03/24/2012 0400   CREATININE 0.70 03/24/2012 0400      Component Value Date/Time   CALCIUM 9.2 03/24/2012 0400   ALKPHOS 46 01/09/2012 0242   AST 15 01/09/2012 0242   ALT  21 01/09/2012 0242   BILITOT 0.2* 01/09/2012 0242       Radiological Studies: Dg Chest Portable 1 View  03/20/2012  *RADIOLOGY REPORT*  Clinical Data: Shortness of breath and weakness.  PORTABLE CHEST - 1 VIEW  Comparison: Chest radiograph performed 01/12/2012  Findings: The lungs are well-aerated.  Vascular congestion is noted, with bilateral central and bibasilar  airspace opacities, concerning for pulmonary edema.  Superimposed pneumonia is a concern.  There is no evidence of pleural effusion or pneumothorax.  The cardiomediastinal silhouette is mildly enlarged; calcification is noted within the aortic arch.  No acute osseous abnormalities are seen.  There is stable likely chronic deformity of the left humeral neck.  IMPRESSION:  1.  Vascular congestion and mild cardiomegaly, with bilateral central and bibasilar airspace opacities, concerning for pulmonary edema.  Superimposed pneumonia is a concern. 2.  Stable likely chronic deformity of the left humeral neck.  Original Report Authenticated By: Tonia Ghent, M.D.    Impression and Plan: #1. Stable mild leukocytosis with borderline lymphocytosis and mild normochromic anemia. Her daughter accompanied her today. We discussed the value of proceeding with a flow cytometry analysis of her peripheral blood. I told her that even if we made the diagnosis of early stage chronic lymphocytic leukemia that the treatment of choice even in somebody younger than her would be observation alone. The test is expensive. Results won't change our clinical management. She is quite happy not to go through with this test. In fact, she would prefer that her family physician resume her exclusive care and to see me again only if her condition changes. I think this is very reasonable.  #2. Essential hypertension  #3. Chronic atrial fibrillation  #4. Chronic congestive heart failure related to coronary artery disease and previous MI.  #5. Advanced psoriasis on chronic immunosuppressive therapy with methotrexate.  #6. Cerebrovascular disease with prior history of stroke    CC:. Dr. Merri Brunette   Levert Feinstein, MD 7/22/20137:43 PM

## 2012-04-05 LAB — PROTEIN ELECTROPHORESIS, SERUM, WITH REFLEX
Albumin ELP: 60.8 % (ref 55.8–66.1)
Total Protein, Serum Electrophoresis: 6.2 g/dL (ref 6.0–8.3)

## 2012-04-05 LAB — LACTATE DEHYDROGENASE: LDH: 77 U/L — ABNORMAL LOW (ref 94–250)

## 2012-04-19 NOTE — Telephone Encounter (Signed)
No note

## 2012-05-11 ENCOUNTER — Other Ambulatory Visit: Payer: Self-pay | Admitting: *Deleted

## 2012-05-11 ENCOUNTER — Telehealth: Payer: Self-pay | Admitting: Oncology

## 2012-05-11 ENCOUNTER — Other Ambulatory Visit: Payer: Self-pay | Admitting: Oncology

## 2012-05-11 DIAGNOSIS — C8400 Mycosis fungoides, unspecified site: Secondary | ICD-10-CM

## 2012-05-11 NOTE — Telephone Encounter (Signed)
Talked to pt and gave her appt date and time for 05/29/12 lab and MD

## 2012-05-23 ENCOUNTER — Telehealth: Payer: Self-pay | Admitting: *Deleted

## 2012-05-23 NOTE — Telephone Encounter (Signed)
Patient called about making trip to [place] and making plans.  She comes in Monday to discuss treatment with Dr. Cyndie Chime.  Let her know its in her best interest to discuss with Dr. Reece Agar at visit and then make decision based on the information he gives her.  She is agreeable with this and her dtr. Will accompany her on this next MD visit.

## 2012-05-28 ENCOUNTER — Other Ambulatory Visit: Payer: Self-pay | Admitting: Oncology

## 2012-05-28 DIAGNOSIS — E785 Hyperlipidemia, unspecified: Secondary | ICD-10-CM

## 2012-05-28 DIAGNOSIS — C8404 Mycosis fungoides, lymph nodes of axilla and upper limb: Secondary | ICD-10-CM

## 2012-05-29 ENCOUNTER — Encounter: Payer: Self-pay | Admitting: Oncology

## 2012-05-29 ENCOUNTER — Ambulatory Visit (HOSPITAL_BASED_OUTPATIENT_CLINIC_OR_DEPARTMENT_OTHER): Payer: Medicare Other | Admitting: Oncology

## 2012-05-29 ENCOUNTER — Other Ambulatory Visit (HOSPITAL_BASED_OUTPATIENT_CLINIC_OR_DEPARTMENT_OTHER): Payer: Medicare Other | Admitting: Lab

## 2012-05-29 VITALS — BP 142/69 | HR 60 | Temp 98.7°F | Resp 18 | Ht 66.0 in | Wt 138.4 lb

## 2012-05-29 DIAGNOSIS — R599 Enlarged lymph nodes, unspecified: Secondary | ICD-10-CM

## 2012-05-29 DIAGNOSIS — E785 Hyperlipidemia, unspecified: Secondary | ICD-10-CM

## 2012-05-29 DIAGNOSIS — C8400 Mycosis fungoides, unspecified site: Secondary | ICD-10-CM

## 2012-05-29 DIAGNOSIS — C8409 Mycosis fungoides, extranodal and solid organ sites: Secondary | ICD-10-CM

## 2012-05-29 DIAGNOSIS — C8404 Mycosis fungoides, lymph nodes of axilla and upper limb: Secondary | ICD-10-CM

## 2012-05-29 DIAGNOSIS — R799 Abnormal finding of blood chemistry, unspecified: Secondary | ICD-10-CM

## 2012-05-29 DIAGNOSIS — R21 Rash and other nonspecific skin eruption: Secondary | ICD-10-CM

## 2012-05-29 HISTORY — DX: Mycosis fungoides, lymph nodes of axilla and upper limb: C84.04

## 2012-05-29 LAB — MORPHOLOGY: PLT EST: ADEQUATE

## 2012-05-29 LAB — CBC WITH DIFFERENTIAL/PLATELET
Eosinophils Absolute: 0.3 10*3/uL (ref 0.0–0.5)
MONO#: 1 10*3/uL — ABNORMAL HIGH (ref 0.1–0.9)
MONO%: 6.1 % (ref 0.0–14.0)
NEUT#: 7.5 10*3/uL — ABNORMAL HIGH (ref 1.5–6.5)
RBC: 3.99 10*6/uL (ref 3.70–5.45)
RDW: 14.3 % (ref 11.2–14.5)
WBC: 15.9 10*3/uL — ABNORMAL HIGH (ref 3.9–10.3)

## 2012-05-29 LAB — LIPID PANEL
HDL: 34 mg/dL — ABNORMAL LOW (ref >39)
Total CHOL/HDL Ratio: 5.9 Ratio
Triglycerides: 161 mg/dL — ABNORMAL HIGH (ref <150)

## 2012-05-29 LAB — COMPREHENSIVE METABOLIC PANEL (CC13)
ALT: 18 U/L (ref 0–55)
CO2: 27 mEq/L (ref 22–29)
Creatinine: 1.1 mg/dL (ref 0.6–1.1)
Total Bilirubin: 0.3 mg/dL (ref 0.20–1.20)

## 2012-05-29 LAB — CHCC SMEAR

## 2012-05-29 LAB — LACTATE DEHYDROGENASE (CC13): LDH: 101 U/L — ABNORMAL LOW (ref 125–220)

## 2012-05-30 ENCOUNTER — Telehealth: Payer: Self-pay | Admitting: Oncology

## 2012-05-30 NOTE — Progress Notes (Signed)
Hematology and Oncology Follow Up Visit  Sara [name] [id number] 07-28-1919 78 y.o. 05/30/2012 6:40 PM   Principle Diagnosis: Encounter Diagnosis  Name Primary?  . 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 Blood was sent at time of her visit here on July 22. 91% of the lymphocytes mark out as T cells and are CD4 positive and CD 7 negative consistent with a T-cell lymphoproliferative disorder. Review of the peripheral blood showed that a number of atypical lymphocytes were present with irregular and cerebriform nuclei. 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 This information was communicated to her dermatologist who was kind enough to call me to discuss things. At some point her skin disorder was consistent with classic psoriasis. She has been on topical triamcinolone cream and low dose weekly methotrexate and has had UV light treatments in the past with partial improvement.  At the present time, in addition to a diffuse, confluent, erythematous rash, she has marked desquamation of the skin of her palms and soles. I showed the patient and her family a textbook picture of of a patient with Sezary's syndrome and changes were identical. She remains asymptomatic with no constitutional symptoms. At time of her initial visit here in May, she had a single area of right axillary adenopathy which has been persistent but unchanged.  Medications: reviewed  Allergies:  Allergies  Allergen Reactions  . Penicillins Other (See Comments)    unknown  . Triamterene Other (See Comments)    unknown  . Alendronate Sodium Rash     Physical Exam: Blood pressure 142/69, pulse  60, temperature 98.7 F (37.1 C), temperature source Oral, resp. rate 18, height 5\' 6"  (1.676 m), weight 138 lb 6.4 oz (62.778 kg). Wt Readings from Last 3 Encounters:  05/29/12 138 lb 6.4 oz (62.778 kg)  04/03/12 137 lb (62.143 kg)  03/24/12 141 lb 12.1 oz (64.3 kg)     General appearance: Elderly woman who is very pleasant and intelligent HENNT: Pharynx no erythema exudate or mass Lymph nodes: Persistent 2-3 cm lymph node palpable in the right axilla. No other areas of adenopathy Breasts: Lungs: Clear to auscultation resonant to percussion Heart: Regular rhythm Abdomen: Soft nontender. No mass. No organomegaly Extremities: No edema no calf tenderness Vascular: No cyanosis Neurologic: No focal deficit Skin: See above  Lab Results: Lab Results  Component Value Date   WBC 15.9* 05/29/2012   HGB 12.4 05/29/2012   HCT 38.0 05/29/2012   MCV 95.3 05/29/2012   PLT 244 05/29/2012     Chemistry      Component Value Date/Time   NA 136 05/29/2012 1517   NA 137 03/24/2012 0400   K 4.6 05/29/2012 1517   K 4.4 03/24/2012 0400   CL 99 05/29/2012 1517   CL 104 03/24/2012 0400   CO2 27 05/29/2012 1517   CO2 25 03/24/2012 0400   BUN 27.0* 05/29/2012 1517   BUN 14 03/24/2012 0400   CREATININE 1.1 05/29/2012 1517   CREATININE 0.70 03/24/2012 0400      Component Value Date/Time   CALCIUM 9.6 05/29/2012 1517   CALCIUM 9.2 03/24/2012 0400   ALKPHOS 54 05/29/2012 1517   ALKPHOS 46 01/09/2012 0242   AST  18 05/29/2012 1517   AST 15 01/09/2012 0242   ALT 18 05/29/2012 1517   ALT 21 01/09/2012 0242   BILITOT 0.30 05/29/2012 1517   BILITOT 0.2* 01/09/2012 0242      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 She has clinically palpable adenopathy. She has 91% abnormal T lymphocytes on flow cytometry. At minimum she is a stage III B.  She has been on methotrexate and steroids for a long period of time with minimal improvement. This therapy was directed  against psoriasis but is active in cutaneous T-cell lymphoma.  I proposed to her and her family that we do a staging evaluation to include CT scan of her chest abdomen and pelvis to look for other areas of adenopathy and/or splenomegaly. I don't think a bone marrow biopsy is necessary. Her counts have been stable and we have definitive results from the recent flow cytometry of her peripheral blood.  I will see her back again in 2 weeks.  I plan to treat her with Targretin(Bexarotene, a vitamin A derivative,  at an initial dose of 200 mg per meter squared per day  given her age. Current height 5 feet 6 inches weight 138 pounds body surface area 1.7 m which would calculate out to a total dose of 340 mg daily. Escalate as tolerated. I obtained a baseline lipid profile today since this drug can cause hyperlipidemia and in particular hypertriglyceridemia. We reviewed other potential side effects including acquired hypothyroidism which requires monitoring a free T4 levels periodically, and liver toxicity.  If she does not respond to this, and then I would treat her with Vorinostat, an oral histone deacetylase inhibitor with activity in cutaneous T-cell lymphoma.   CC:. Dr. Merri Brunette; Dr. Anda Kraft   Levert Feinstein, MD 9/17/20136:40 PM

## 2012-05-30 NOTE — Telephone Encounter (Signed)
S/w pt today re ct for 9/20 and f/u for 10/1. Pt given d/t/location and instructions and will arrive @ [facility] 9/20 at 10 am to drink water based prep.

## 2012-06-02 ENCOUNTER — Encounter (HOSPITAL_COMMUNITY): Payer: Self-pay

## 2012-06-02 ENCOUNTER — Ambulatory Visit (HOSPITAL_COMMUNITY)
Admission: RE | Admit: 2012-06-02 | Discharge: 2012-06-02 | Disposition: A | Discharge status: Home / Self Care | Payer: Medicare Other | Source: Ambulatory Visit | Attending: Oncology | Admitting: Oncology

## 2012-06-02 DIAGNOSIS — I2510 Atherosclerotic heart disease of native coronary artery without angina pectoris: Secondary | ICD-10-CM | POA: Insufficient documentation

## 2012-06-02 DIAGNOSIS — R1909 Other intra-abdominal and pelvic swelling, mass and lump: Secondary | ICD-10-CM | POA: Insufficient documentation

## 2012-06-02 DIAGNOSIS — K5730 Diverticulosis of large intestine without perforation or abscess without bleeding: Secondary | ICD-10-CM | POA: Insufficient documentation

## 2012-06-02 DIAGNOSIS — C8404 Mycosis fungoides, lymph nodes of axilla and upper limb: Secondary | ICD-10-CM | POA: Insufficient documentation

## 2012-06-02 DIAGNOSIS — R918 Other nonspecific abnormal finding of lung field: Secondary | ICD-10-CM | POA: Insufficient documentation

## 2012-06-13 ENCOUNTER — Ambulatory Visit (HOSPITAL_BASED_OUTPATIENT_CLINIC_OR_DEPARTMENT_OTHER): Payer: Medicare Other | Admitting: Oncology

## 2012-06-13 ENCOUNTER — Telehealth: Payer: Self-pay | Admitting: Oncology

## 2012-06-13 VITALS — BP 158/68 | HR 63 | Temp 97.5°F | Resp 20 | Ht 66.0 in | Wt 139.0 lb

## 2012-06-13 DIAGNOSIS — L659 Nonscarring hair loss, unspecified: Secondary | ICD-10-CM

## 2012-06-13 DIAGNOSIS — C8404 Mycosis fungoides, lymph nodes of axilla and upper limb: Secondary | ICD-10-CM

## 2012-06-13 NOTE — Telephone Encounter (Signed)
gv pt appt schedule for October.  °

## 2012-06-14 ENCOUNTER — Encounter: Payer: Self-pay | Admitting: Oncology

## 2012-06-14 NOTE — Progress Notes (Signed)
Faxed targretin prescription to [facility].

## 2012-06-14 NOTE — Progress Notes (Signed)
Called [employer] @ [phone] for targretin prior auth; should receive response within 24-72 hours ref # [id number].

## 2012-06-14 NOTE — Progress Notes (Signed)
Hematology and Oncology Follow Up Visit  Sara [name] [id number] 04/05/1919 78 y.o. 06/14/2012 7:56 PM   Principle Diagnosis: Encounter Diagnoses  Name Primary?  . 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 Hair loss      Interim History:   Short interim followup visit for this 78 year old lady referred here to evaluate leukocytosis with absolute lymphocytosis. I initially thought that this was straight forward chronic lymphocytic leukemia. However, her lymphocytes appeared abnormal with lobulated nuclei. Flow cytometry showed that these were T cells. On exam she had a single area of right axillary lymphadenopathy. No splenomegaly. She had a diffuse erythematous rash covering her entire body with areas of extensive desquamation of the skin of her palms and soles. She has been on chronic topical steroid creams and lotions as well as oral low dose methotrexate for previously diagnosed psoriasis. 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 We discussed treatment options at length at time of her recent visit here along with her daughter. I felt a trial of Targretin would be reasonable. I gave her printed information to review about the drug. I obtained baseline thyroid functions which were normal, and baseline lipid profile which shows only minimal elevation of cholesterol and triglycerides. This drug is known to affect both of these. I obtained a CT scan of the chest abdomen and pelvis to assess for any additional areas of adenopathy. The clinically palpable node in the right axilla was not mentioned on the scan. A 2.1 cm soft tissue mass indenting on the liver was the only other abnormality noted. The spleen was not enlarged.  She is having progressive pain in her feet due to the significant skin involvement.  Medications: reviewed  Allergies:  Allergies  Allergen Reactions  .  Penicillins Other (See Comments)    unknown  . Triamterene Other (See Comments)    unknown  . Alendronate Sodium Rash     Physical Exam: Blood pressure 158/68, pulse 63, temperature 97.5 F (36.4 C), temperature source Oral, resp. rate 20, height 5\' 6"  (1.676 m), weight 139 lb (63.05 kg). Wt Readings from Last 3 Encounters:  06/13/12 139 lb (63.05 kg)  05/29/12 138 lb 6.4 oz (62.778 kg)  04/03/12 137 lb (62.143 kg)     General appearance: Elderly woman HENNT: Pharynx no erythema or exudate Lymph nodes: Persistent 3-4 cm lymph node palpable right axilla. No other areas of adenopathy Breasts: Not examined Lungs: Clear to auscultation resonant to percussion Heart: Regular rhythm no murmur Abdomen: Soft, nontender, diffuse erythematous/macular rash. No mass, no organomegaly Extremities: No edema. No calf tenderness Vascular: No cyanosis Neurologic: Grossly normal complete exam not repeated today Skin: Diffuse rash on trunk and extremities, desquamation of the skin of the palms and soles  Lab Results: Lab Results  Component Value Date   WBC 15.9* 05/29/2012   HGB 12.4 05/29/2012   HCT 38.0 05/29/2012   MCV 95.3 05/29/2012   PLT 244 05/29/2012     Chemistry      Component Value Date/Time   NA 136 05/29/2012 1517   NA 137 03/24/2012 0400   K 4.6 05/29/2012 1517   K 4.4 03/24/2012 0400   CL 99 05/29/2012 1517   CL 104 03/24/2012 0400   CO2 27 05/29/2012 1517   CO2 25 03/24/2012 0400   BUN 27.0* 05/29/2012 1517   BUN 14 03/24/2012 0400   CREATININE 1.1 05/29/2012 1517  CREATININE 0.70 03/24/2012 0400      Component Value Date/Time   CALCIUM 9.6 05/29/2012 1517   CALCIUM 9.2 03/24/2012 0400   ALKPHOS 54 05/29/2012 1517   ALKPHOS 46 01/09/2012 0242   AST 18 05/29/2012 1517   AST 15 01/09/2012 0242   ALT 18 05/29/2012 1517   ALT 21 01/09/2012 0242   BILITOT 0.30 05/29/2012 1517   BILITOT 0.2* 01/09/2012 0242       Radiological Studies: Ct Chest W Contrast  06/02/2012  *RADIOLOGY  REPORT*  Clinical Data:  Restaging T-cell lymphoma.  CT CHEST, ABDOMEN AND PELVIS WITH CONTRAST  Technique:  Multidetector CT imaging of the chest, abdomen and pelvis was performed following the standard protocol during bolus administration of intravenous contrast.  Contrast:  100 ml Omnipaque 300  Comparison:  CT thoracic lumbar spine 06/28/2008  CT CHEST  Findings:  No axillary or supraclavicular lymphadenopathy.  No mediastinal or hilar lymphadenopathy.  No pericardial fluid. Coarse coronary artery calcifications are present.  Esophagus is mildly thickened.  There are small round peripheral nodules within the left and right lungs that measure less than 5 mm  and number approximately 40. Several of these nodules are evident on the limited  CT (06/28/2008) of the thoracic spine.  IMPRESSION: 1.  Multiple small peripheral nodules likely represent noncalcified granulomas. Stable compared to CT of 06/28/2008.  2.  No evidence of mediastinal lymphadenopathy.  3. Three coronary calcifications and valvular calcification.  CT ABDOMEN AND PELVIS  Findings:  Lateral to the medial right hepatic lobe there is a rounded 21 mm soft tissue density which indents the liver capsule (image 58). This appears to be separate from the liver but cannot completely exclude abnormal liver parenchyma.  There is no focal hepatic lesion.  The gallbladder, pancreas, spleen, adrenal glands are normal.  The left kidney is atrophic.  The right kidney is hypertrophied.  The stomach, small bowel, and colon are normal.  There are multiple diverticula of the sigmoid colon.  Abdominal aorta normal caliber.  No retroperitoneal periportal lymphadenopathy. Well small periaortic retroperitoneal lymph nodes which are not which less than 10 mm.  In the pelvis, the uterus and ovaries appear normal.  The bladder is normal.  No pelvic lymphadenopathy.  There multiple small inguinal lymph nodes largest in the right inguinal region measuring 13 mm.  No pelvic  lymphadenopathy. Review of  bone windows demonstrates no aggressive osseous lesions.  IMPRESSION:  1.  Small round soft tissue mass adjacent to the right hepatic lobe is indeterminate. While the lesion does not appear aggressive, in patient with lymphoma this may need further workup. Remote comparison would be helpful.  Consider  MRI abdomen with contrast for further evaluation.  2.  Small periaortic lymph nodes are not pathologic by CT size criteria.   Original Report Authenticated By: Genevive Bi, M.D.     Impression and Plan: Cutaneous T-cell lymphoma Plan: Trial of Targretin. In view of her age I will start at the lowest possible dose of 75 mg daily. Monitor lipids, liver, and thyroid functions. Reassess at one-month intervals to assess for efficacy and toxicity. Escalated dose if necessary. We will need to get preauthorization and assistance from either the pharmaceutical company or elsewhere to pay for this very expensive drug. The alternative treatment for cutaneous T-cell lymphoma, Vorinostat, is even more expensive. I gave the patient the option of seeing a local dermatologist at [facility] who is a expert in the treatment of cutaneous T-cell lymphoma, Dr. Junita Push.  It typically takes a number of months to get the patient seen. This option is open to the patient if she so desires and I will make the arrangements.   CC:. Dr. Merri Brunette; Dr. Anda Kraft   Levert Feinstein, MD 10/2/20137:56 PM

## 2012-06-15 ENCOUNTER — Telehealth: Payer: Self-pay | Admitting: *Deleted

## 2012-06-15 NOTE — Telephone Encounter (Signed)
Received call from pt requesting Dr. Cyndie Chime to "go ahead with referral to [facility] with Dr. Constance Goltz; wants to make sure "[facility] will do the work-up; but I want any treatments done here; don't want to travel"  Called and spoke with pt explaining Dr. Cyndie Chime out of the office but I would make him aware of request.  Pt verbalized understanding and note to Dr. Cyndie Chime.

## 2012-06-16 ENCOUNTER — Encounter: Payer: Self-pay | Admitting: Oncology

## 2012-06-16 NOTE — Progress Notes (Signed)
[employer] [id number] approved targretin 75mg  softgel from 06/15/12-06/15/14.

## 2012-06-21 ENCOUNTER — Other Ambulatory Visit: Payer: Self-pay | Admitting: *Deleted

## 2012-06-21 DIAGNOSIS — C84A0 Cutaneous T-cell lymphoma, unspecified, unspecified site: Secondary | ICD-10-CM

## 2012-06-21 MED ORDER — BEXAROTENE 75 MG PO CAPS: 75.0000 mg | ORAL_CAPSULE | Freq: Every day | ORAL | Status: DC

## 2012-06-21 NOTE — Telephone Encounter (Signed)
New start on Targretin.  Written script given to Sara [name] on 06-14-12

## 2012-06-22 ENCOUNTER — Telehealth: Payer: Self-pay | Admitting: *Deleted

## 2012-06-22 NOTE — Telephone Encounter (Signed)
Received vm call from pt stating that she has been off the MTX @ 1 wk & is having a problem with itching all over & has had white flakes all over since last couple days.  She reports that she is miserable wants to know if she is to wait to start new med & should she be using clobetisol cream.  Returned call & she is waiting on targretin.  From notes script was sent to [facility] outpt pharm & she states she got a call from [place] but didn't know what pharmacy & was told that it would be $693/mo which she can handle for now.  Suggested she call [facility] & see if script is available & if so can start drug which will hopefully start helping her.  She can try some benadryl & non-pharmaceutical regimens like cool oatmeal baths, etc.  Will discuss with Dr Cyndie Chime for further instructions.

## 2012-06-26 ENCOUNTER — Telehealth: Payer: Self-pay | Admitting: Oncology

## 2012-06-26 ENCOUNTER — Telehealth: Payer: Self-pay | Admitting: *Deleted

## 2012-06-26 NOTE — Telephone Encounter (Signed)
Called and spoke with pt regarding rash.  Per Dr. Cyndie Chime will take 1-2 months to get appt with Dr. Constance Goltz at [facility] and he recommends starting Targretin when available. Pt informed that Dr. Cyndie Chime doubts stopping MTX for 1 week is related to itching.  Pt states rash is "no better and I'm not using any benadryl at this time.  Also stated she is expecting [facility] to call today if Targretin is in.  Pt also informed Dr. Cyndie Chime recommends to call her Dermatologist for any other recommendation.  Pt verbalized understanding.

## 2012-06-26 NOTE — Telephone Encounter (Signed)
Received call from pt asking "the pharmacy called and said Targretin is here; can I wait a week and a half to begin because I have some events to go to; is it OK to wait?"  Note to Dr. Cyndie Chime.

## 2012-06-26 NOTE — Telephone Encounter (Signed)
Faxed pt's medical records to Dr. Karalee Height office at [facility].  Dr. Orlie Dakin assistant will review records and call pt with appt. Pt's dtr, is aware.

## 2012-06-27 ENCOUNTER — Telehealth: Payer: Self-pay | Admitting: *Deleted

## 2012-06-27 NOTE — Telephone Encounter (Signed)
Called and spoke with pt regarding question yesterday 06/26/12.  Per Dr. Cyndie Chime, OK for pt wait to begin Targretin until after her "events" are done.  Pt verbalized understanding and expressed appreciation for call.

## 2012-07-03 ENCOUNTER — Telehealth: Payer: Self-pay | Admitting: Oncology

## 2012-07-03 NOTE — Telephone Encounter (Signed)
Pt has appt with Dr. Corky Downs @ [facility] on 07/25/12 @ 8:30. Pt is aware.

## 2012-07-11 ENCOUNTER — Ambulatory Visit (HOSPITAL_BASED_OUTPATIENT_CLINIC_OR_DEPARTMENT_OTHER): Payer: Medicare Other | Admitting: Oncology

## 2012-07-11 ENCOUNTER — Other Ambulatory Visit (HOSPITAL_BASED_OUTPATIENT_CLINIC_OR_DEPARTMENT_OTHER): Payer: Medicare Other | Admitting: Lab

## 2012-07-11 ENCOUNTER — Telehealth: Payer: Self-pay | Admitting: Oncology

## 2012-07-11 VITALS — BP 101/49 | HR 68 | Temp 96.9°F | Resp 16 | Ht 66.0 in | Wt 137.2 lb

## 2012-07-11 DIAGNOSIS — D72829 Elevated white blood cell count, unspecified: Secondary | ICD-10-CM

## 2012-07-11 DIAGNOSIS — D72820 Lymphocytosis (symptomatic): Secondary | ICD-10-CM

## 2012-07-11 DIAGNOSIS — C8404 Mycosis fungoides, lymph nodes of axilla and upper limb: Secondary | ICD-10-CM

## 2012-07-11 DIAGNOSIS — L659 Nonscarring hair loss, unspecified: Secondary | ICD-10-CM

## 2012-07-11 DIAGNOSIS — R21 Rash and other nonspecific skin eruption: Secondary | ICD-10-CM

## 2012-07-11 LAB — COMPREHENSIVE METABOLIC PANEL (CC13)
ALT: 21 U/L (ref 0–55)
BUN: 27 mg/dL — ABNORMAL HIGH (ref 7.0–26.0)
CO2: 29 mEq/L (ref 22–29)
Calcium: 9.8 mg/dL (ref 8.4–10.4)
Chloride: 106 mEq/L (ref 98–107)
Creatinine: 1.1 mg/dL (ref 0.6–1.1)
Glucose: 122 mg/dl — ABNORMAL HIGH (ref 70–99)

## 2012-07-11 LAB — CBC WITH DIFFERENTIAL/PLATELET
Basophils Absolute: 0.1 10*3/uL (ref 0.0–0.1)
Eosinophils Absolute: 0.3 10*3/uL (ref 0.0–0.5)
HCT: 34.6 % — ABNORMAL LOW (ref 34.8–46.6)
HGB: 11.2 g/dL — ABNORMAL LOW (ref 11.6–15.9)
LYMPH%: 57.4 % — ABNORMAL HIGH (ref 14.0–49.7)
MONO#: 0.9 10*3/uL (ref 0.1–0.9)
NEUT%: 33.5 % — ABNORMAL LOW (ref 38.4–76.8)
Platelets: 233 10*3/uL (ref 145–400)
WBC: 13.7 10*3/uL — ABNORMAL HIGH (ref 3.9–10.3)
lymph#: 7.9 10*3/uL — ABNORMAL HIGH (ref 0.9–3.3)

## 2012-07-11 LAB — LACTATE DEHYDROGENASE (CC13): LDH: 106 U/L — ABNORMAL LOW (ref 125–220)

## 2012-07-11 LAB — URIC ACID (CC13): Uric Acid, Serum: 9.2 mg/dl — ABNORMAL HIGH (ref 2.6–7.4)

## 2012-07-11 LAB — TECHNOLOGIST REVIEW

## 2012-07-11 LAB — T4, FREE: Free T4: 0.92 ng/dL (ref 0.80–1.80)

## 2012-07-11 NOTE — Progress Notes (Signed)
Short interim followup visit for this pleasant 78 year old woman referred here recently for evaluation of leukocytosis with absolute lymphocytosis. I initially thought that this was chronic lymphocytic leukemia. However lymphocytes were atypical and flow cytometry suggested a T-cell lymphoproliferative process with a predominance of CD4 positive cells. She has an extensive skin rash which has been treated as psoriasis for many years. Despite methotrexate and topical steroids her skin problem seemed to be getting worse. The flow cytometry data suggested that this might in fact be a cutaneous T-cell lymphoma. A staging evaluation did not show any gross adenopathy organomegaly. On physical exam she had a persistent approximate 3 cm lymph node palpable in the right axilla but no other areas of adenopathy. I planned to start her on a trial of low-dose Targretin however I offered her a second opinion with a regional expert Dr. Raynelle Fanning at [facility]. I e-mailed some preliminary information to Dr. Constance Goltz. She was kind enough to call me on the phone to discuss the case. She felt that more information was needed to establish a diagnosis of cutaneous T-cell lymphoma, specifically a repeat skin biopsy and repeat flow cytometry for additional markers. I told the patient not to start the Targretin pending that evaluation.  She was seen last week. Additional lab work and skin biopsies were done. Results are pending and she will return on November 4 for a disposition.  Brief exam today limited to the right axilla where there is a persistent 3 cm enlarged lymph node. Diffuse erythematous skin rash with prominent desquamation of the skin on the palms and soles. Feet not examined today.  Laparoscopic cholecystectomy and hemoglobin 11.2 MCV 95.5 slightly lower than recent baseline of 12.4 on September 16. White count stable at 13,700. Lymphocyte percentage up from 45% to 57%. Neutrophils 33%. Platelets 233,000. Serum LDH remains  normal at 106  Impression: Cutaneous T-cell lymphoma versus atypical psoriasis. Followup at [facility] on November 4. See discussion above.

## 2012-07-11 NOTE — Telephone Encounter (Signed)
gv pt/family appt schedule for November.

## 2012-07-21 ENCOUNTER — Telehealth: Payer: Self-pay | Admitting: *Deleted

## 2012-07-21 NOTE — Telephone Encounter (Signed)
Received call from pt stating that she saw Dr. Corky Downs at [facility] & she put her on a lot of meds. She reports that she wants to stay with Dr Cyndie Chime & is confused.  She states that she put her on a hydrocortisone cream & hydroxyzine  & she is getting light therapy at Dr. Sharyn Lull & she is supposed to take methoxicillin? 10 mg before light treatment & she is supposed to start Peg-interferon but the pharmacy can't get it yet.  Informed her that she should discuss with Dr. Cyndie Chime at 07/25/12 visit & hopefully he received some communication from Dr. Corky Downs.  Note left for Dr Cyndie Chime.

## 2012-07-22 ENCOUNTER — Encounter (HOSPITAL_COMMUNITY): Payer: Self-pay | Admitting: Emergency Medicine

## 2012-07-22 ENCOUNTER — Observation Stay (HOSPITAL_COMMUNITY)
Admission: EM | Admit: 2012-07-22 | Discharge: 2012-07-23 | Disposition: A | Discharge status: Home / Self Care | Payer: Medicare Other | Attending: Internal Medicine | Admitting: Internal Medicine

## 2012-07-22 ENCOUNTER — Emergency Department (HOSPITAL_COMMUNITY): Payer: Medicare Other

## 2012-07-22 DIAGNOSIS — R079 Chest pain, unspecified: Principal | ICD-10-CM | POA: Insufficient documentation

## 2012-07-22 DIAGNOSIS — I252 Old myocardial infarction: Secondary | ICD-10-CM | POA: Insufficient documentation

## 2012-07-22 DIAGNOSIS — I4891 Unspecified atrial fibrillation: Secondary | ICD-10-CM

## 2012-07-22 DIAGNOSIS — I10 Essential (primary) hypertension: Secondary | ICD-10-CM | POA: Diagnosis present

## 2012-07-22 DIAGNOSIS — M25571 Pain in right ankle and joints of right foot: Secondary | ICD-10-CM | POA: Diagnosis present

## 2012-07-22 DIAGNOSIS — C8409 Mycosis fungoides, extranodal and solid organ sites: Secondary | ICD-10-CM | POA: Insufficient documentation

## 2012-07-22 DIAGNOSIS — L97309 Non-pressure chronic ulcer of unspecified ankle with unspecified severity: Secondary | ICD-10-CM | POA: Insufficient documentation

## 2012-07-22 DIAGNOSIS — Z8673 Personal history of transient ischemic attack (TIA), and cerebral infarction without residual deficits: Secondary | ICD-10-CM | POA: Insufficient documentation

## 2012-07-22 DIAGNOSIS — M25579 Pain in unspecified ankle and joints of unspecified foot: Secondary | ICD-10-CM | POA: Insufficient documentation

## 2012-07-22 DIAGNOSIS — D649 Anemia, unspecified: Secondary | ICD-10-CM

## 2012-07-22 DIAGNOSIS — I2510 Atherosclerotic heart disease of native coronary artery without angina pectoris: Secondary | ICD-10-CM | POA: Insufficient documentation

## 2012-07-22 DIAGNOSIS — I5032 Chronic diastolic (congestive) heart failure: Secondary | ICD-10-CM

## 2012-07-22 DIAGNOSIS — I509 Heart failure, unspecified: Secondary | ICD-10-CM | POA: Insufficient documentation

## 2012-07-22 HISTORY — DX: Hyperlipidemia, unspecified: E78.5

## 2012-07-22 HISTORY — DX: Pneumonia, unspecified organism: J18.9

## 2012-07-22 LAB — POCT I-STAT, CHEM 8
BUN: 25 mg/dL — ABNORMAL HIGH (ref 6–23)
Calcium, Ion: 1.15 mmol/L (ref 1.13–1.30)
Creatinine, Ser: 1.1 mg/dL (ref 0.50–1.10)
Glucose, Bld: 126 mg/dL — ABNORMAL HIGH (ref 70–99)
TCO2: 24 mmol/L (ref 0–100)

## 2012-07-22 LAB — CBC
HCT: 34.5 % — ABNORMAL LOW (ref 36.0–46.0)
Hemoglobin: 11.7 g/dL — ABNORMAL LOW (ref 12.0–15.0)
MCH: 31.5 pg (ref 26.0–34.0)
MCHC: 33.9 g/dL (ref 30.0–36.0)
RDW: 13.4 % (ref 11.5–15.5)

## 2012-07-22 LAB — POCT I-STAT TROPONIN I

## 2012-07-22 MED ORDER — SODIUM CHLORIDE 0.9 % IV SOLN: INTRAVENOUS | Status: DC

## 2012-07-22 MED ORDER — DILTIAZEM HCL 100 MG IV SOLR
5.0000 mg/h | INTRAVENOUS | Status: DC
  Filled 2012-07-22: qty 100

## 2012-07-22 NOTE — ED Notes (Signed)
Pt reports central chest pressure onset at 2055 denies diaphoresis , SoB , pain or radiation. Pt reported to EMS that she felt her heart racing On monitor pt  AFib that was intermittant and she had intermittant tachycardia. IV site obtained  Pt had take ASA 324 and SL nitro,  prior to EMS arrival. Pt is full DNR.

## 2012-07-22 NOTE — ED Provider Notes (Signed)
History     CSN: [id number]  Arrival date & time 07/22/12  2247   First MD Initiated Contact with Patient 07/22/12 2256      Chief Complaint  Patient presents with  . Chest Pain    chest pressure denies pain    (Consider location/radiation/quality/duration/timing/severity/associated sxs/prior treatment) HPI HX per PT, tonight developed substernal CP, no F/C, cough or SOB. She then felt palpitations with h/o paroxysmal afib on cardizem for the same, no recent fever or illness.  MOD in severity, no known agrevating ro alleviating factors. Constant since onset, takes all meds as prescribed. No recent change in medications.   Past Medical History  Diagnosis Date  . Hypertension   . Atrial fibrillation   . Psoriasis   . Congestive heart failure (CHF)   . History of CVA (cerebrovascular accident)   . Coronary artery disease   . Myocardial infarction   . CHF (congestive heart failure)   . Stroke   . Shortness of breath   . H/O hiatal hernia   . Closed fracture of head of left humerus 5/12  . Basal cell carcinoma   . 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  Flow cytometry 04/04/12: 91% cells CD4 positive CD 26 negative    Past Surgical History  Procedure Date  . Tonsillectomy   . Dilation and curettage of uterus   . Eye surgery   . Cataracts   . Vertebroplasty     Family History  Problem Relation Age of Onset  . Stomach cancer Mother   . Pneumonia Father     History  Substance Use Topics  . Smoking status: Never Smoker   . Smokeless tobacco: Never Used  . Alcohol Use: No    OB History    Grav Para Term Preterm Abortions TAB SAB Ect Mult Living                  Review of Systems  Constitutional: Negative for fever and chills.  HENT: Negative for neck pain and neck stiffness.   Eyes: Negative for pain.  Respiratory:  Negative for shortness of breath.   Cardiovascular: Positive for chest pain and palpitations. Negative for leg swelling.  Gastrointestinal: Negative for abdominal pain.  Genitourinary: Negative for dysuria.  Musculoskeletal: Negative for back pain.  Skin: Negative for rash.  Neurological: Negative for headaches.  All other systems reviewed and are negative.    Allergies  Penicillins; Triamterene; and Alendronate sodium  Home Medications   Current Outpatient Rx  Name  Route  Sig  Dispense  Refill  . ASPIRIN 81 MG PO CHEW   Oral   Chew 81 mg by mouth 2 (two) times daily.         Marland Kitchen CARVEDILOL 12.5 MG PO TABS   Oral   Take 12.5 mg by mouth 2 (two) times daily with a meal.         . CLOPIDOGREL BISULFATE 75 MG PO TABS   Oral   Take 75 mg by mouth daily.         . CO Q 10 100 MG PO CAPS   Oral   Take 1 capsule by mouth daily.         Marland Kitchen DILTIAZEM HCL ER COATED BEADS 120 MG PO CP24   Oral   Take 120 mg by mouth daily.         Marland Kitchen  DOCUSATE SODIUM 100 MG PO CAPS   Oral   Take 100 mg by mouth 2 (two) times daily as needed. For constipation         . FOLIC ACID 1 MG PO TABS   Oral   Take 1 mg by mouth daily.         . FUROSEMIDE 40 MG PO TABS   Oral   Take 40 mg by mouth every morning.         Marland Kitchen HYDROXYZINE HCL 10 MG PO TABS   Oral   Take 10 mg by mouth at bedtime.          Marland Kitchen LISINOPRIL 40 MG PO TABS   Oral   Take 1 tablet (40 mg total) by mouth daily.         Marland Kitchen FISH OIL 1000 MG PO CAPS   Oral   Take 1,000 mg by mouth daily.          Marland Kitchen PANTOPRAZOLE SODIUM 40 MG PO TBEC   Oral   Take 40 mg by mouth daily.         Marland Kitchen SPIRONOLACTONE 25 MG PO TABS   Oral   Take 12.5 mg by mouth daily.         Marland Kitchen VITAMIN C 500 MG PO TABS   Oral   Take 500 mg by mouth daily.           BP 118/81  Pulse 122  Temp 97.9 F (36.6 C) (Oral)  Resp 16  SpO2 98%  Physical Exam  Constitutional: She is oriented to person, place, and time. She appears  well-developed and well-nourished.  HENT:  Head: Normocephalic and atraumatic.  Eyes: Conjunctivae normal and EOM are normal. Pupils are equal, round, and reactive to light.  Neck: Trachea normal. Neck supple. No thyromegaly present.  Cardiovascular: S1 normal, S2 normal, intact distal pulses and normal pulses.     No systolic murmur is present   No diastolic murmur is present  Pulses:      Radial pulses are 2+ on the right side, and 2+ on the left side.       irregularily irregular  Pulmonary/Chest: Effort normal and breath sounds normal. She has no wheezes. She has no rhonchi. She has no rales. She exhibits no tenderness.  Abdominal: Soft. Normal appearance and bowel sounds are normal. There is no tenderness. There is no CVA tenderness and negative Murphy's sign.  Musculoskeletal:       BLE:s Calves nontender, no cords or erythema, negative Homans sign  Neurological: She is alert and oriented to person, place, and time. She has normal strength. No cranial nerve deficit or sensory deficit. GCS eye subscore is 4. GCS verbal subscore is 5. GCS motor subscore is 6.  Skin: Skin is warm and dry. No rash noted. She is not diaphoretic.  Psychiatric: Her speech is normal.       Cooperative and appropriate    ED Course  Procedures (including critical care time)  Results for orders placed during the hospital encounter of 07/22/12  CBC      Component Value Range   WBC 18.1 (*) 4.0 - 10.5 K/uL   RBC 3.72 (*) 3.87 - 5.11 MIL/uL   Hemoglobin 11.7 (*) 12.0 - 15.0 g/dL   HCT 08.6 (*) 57.8 - 46.9 %   MCV 92.7  78.0 - 100.0 fL   MCH 31.5  26.0 - 34.0 pg   MCHC 33.9  30.0 - 36.0 g/dL  RDW 13.4  11.5 - 15.5 %   Platelets 292  150 - 400 K/uL  POCT I-STAT, CHEM 8      Component Value Range   Sodium 142  135 - 145 mEq/L   Potassium 4.2  3.5 - 5.1 mEq/L   Chloride 105  96 - 112 mEq/L   BUN 25 (*) 6 - 23 mg/dL   Creatinine, Ser 1.61  0.50 - 1.10 mg/dL   Glucose, Bld 096 (*) 70 - 99 mg/dL    Calcium, Ion 0.45  4.09 - 1.30 mmol/L   TCO2 24  0 - 100 mmol/L   Hemoglobin 11.9 (*) 12.0 - 15.0 g/dL   HCT 81.1 (*) 91.4 - 78.2 %  POCT I-STAT TROPONIN I      Component Value Range   Troponin i, poc 0.02  0.00 - 0.08 ng/mL   Comment 3            Dg Chest Portable 1 View  07/22/2012  *RADIOLOGY REPORT*  Clinical Data: Chest pain and pressure, CHF, a-fib  PORTABLE CHEST - 1 VIEW  Comparison: 03/20/2012; 01/12/2012; chest CT - 06/02/2012  Findings: Grossly unchanged cardiac silhouette and mediastinal contours with atherosclerotic calcifications within the thoracic aorta.  The lungs are hyperexpanded with flattening of bilateral hemidiaphragms.  There is mild diffuse thickening of the pulmonary interstitium.  No focal airspace opacity.  No definite pleural effusion or pneumothorax.  Unchanged bones including lower thoracic vertebroplasty/kyphoplasty.  IMPRESSION: Hyperexpanded lungs without definite acute cardiopulmonary disease on this AP portable examination.  Further evaluation with a PA and lateral chest radiograph may be obtained as clinically indicated.   Original Report Authenticated By: Tacey Ruiz, MD      Date: 07/22/2012  Rate: 129  Rhythm: atrial fibrillation  QRS Axis: normal  Intervals: QT prolonged  ST/T Wave abnormalities: nonspecific ST changes  Conduction Disutrbances:none  Narrative Interpretation:   Old EKG Reviewed: none available  CRITICAL CARE Performed by: Yassmin Binegar   Total critical care time: 35  Critical care time was exclusive of separately billable procedures and treating other patients.  Critical care was necessary to treat or prevent imminent or life-threatening deterioration.  Critical care was time spent personally by me on the following activities: development of treatment plan with patient and/or surrogate as well as nursing, discussions with consultants, evaluation of patient's response to treatment, examination of patient, obtaining history from  patient or surrogate, ordering and performing treatments and interventions, ordering and review of laboratory studies, ordering and review of radiographic studies, pulse oximetry and re-evaluation of patient's condition. IV cardizem drip rate control. ASA PTA.   11:56 PM CAR c/s - d/w Dr Adela Glimpse on call for [facility] - will see in ED MDM  Rapid afib and CP with h/o ACS. IV diltiazem drip and CAR consult for admit. Labs. ECG. CXR.         Sunnie Nielsen, MD 07/23/12 [phone]

## 2012-07-23 ENCOUNTER — Encounter (HOSPITAL_COMMUNITY): Payer: Self-pay | Admitting: Internal Medicine

## 2012-07-23 ENCOUNTER — Observation Stay (HOSPITAL_COMMUNITY): Payer: Medicare Other

## 2012-07-23 ENCOUNTER — Emergency Department (HOSPITAL_COMMUNITY): Payer: Medicare Other

## 2012-07-23 DIAGNOSIS — I4891 Unspecified atrial fibrillation: Secondary | ICD-10-CM

## 2012-07-23 DIAGNOSIS — M79609 Pain in unspecified limb: Secondary | ICD-10-CM

## 2012-07-23 DIAGNOSIS — M25571 Pain in right ankle and joints of right foot: Secondary | ICD-10-CM | POA: Diagnosis present

## 2012-07-23 DIAGNOSIS — M7989 Other specified soft tissue disorders: Secondary | ICD-10-CM

## 2012-07-23 DIAGNOSIS — M25579 Pain in unspecified ankle and joints of unspecified foot: Secondary | ICD-10-CM

## 2012-07-23 DIAGNOSIS — I10 Essential (primary) hypertension: Secondary | ICD-10-CM

## 2012-07-23 DIAGNOSIS — D649 Anemia, unspecified: Secondary | ICD-10-CM

## 2012-07-23 DIAGNOSIS — I5032 Chronic diastolic (congestive) heart failure: Secondary | ICD-10-CM

## 2012-07-23 DIAGNOSIS — R079 Chest pain, unspecified: Secondary | ICD-10-CM

## 2012-07-23 LAB — CBC
Hemoglobin: 11.1 g/dL — ABNORMAL LOW (ref 12.0–15.0)
Platelets: 280 10*3/uL (ref 150–400)
RBC: 3.68 MIL/uL — ABNORMAL LOW (ref 3.87–5.11)

## 2012-07-23 LAB — COMPREHENSIVE METABOLIC PANEL
BUN: 20 mg/dL (ref 6–23)
CO2: 27 mEq/L (ref 19–32)
Calcium: 9.2 mg/dL (ref 8.4–10.5)
Creatinine, Ser: 0.87 mg/dL (ref 0.50–1.10)
GFR calc Af Amer: 65 mL/min — ABNORMAL LOW (ref >90)
GFR calc non Af Amer: 57 mL/min — ABNORMAL LOW (ref >90)
Glucose, Bld: 97 mg/dL (ref 70–99)
Total Bilirubin: 0.2 mg/dL — ABNORMAL LOW (ref 0.3–1.2)

## 2012-07-23 LAB — TROPONIN I
Troponin I: <0.3 ng/mL (ref <0.30)
Troponin I: <0.3 ng/mL (ref <0.30)

## 2012-07-23 MED ORDER — ACETAMINOPHEN 650 MG RE SUPP: 650.0000 mg | Freq: Four times a day (QID) | RECTAL | Status: DC | PRN

## 2012-07-23 MED ORDER — DILTIAZEM HCL ER COATED BEADS 120 MG PO CP24
120.0000 mg | ORAL_CAPSULE | Freq: Every day | ORAL | Status: DC
  Administered 2012-07-23: 120 mg via ORAL
  Filled 2012-07-23: qty 1

## 2012-07-23 MED ORDER — ACETAMINOPHEN 325 MG PO TABS: 650.0000 mg | ORAL_TABLET | Freq: Four times a day (QID) | ORAL | Status: DC | PRN

## 2012-07-23 MED ORDER — SODIUM CHLORIDE 0.9 % IV SOLN
INTRAVENOUS | Status: DC
  Administered 2012-07-23: 05:00:00 via INTRAVENOUS

## 2012-07-23 MED ORDER — CLOPIDOGREL BISULFATE 75 MG PO TABS
75.0000 mg | ORAL_TABLET | Freq: Every day | ORAL | Status: DC
  Administered 2012-07-23: 75 mg via ORAL
  Filled 2012-07-23 (×2): qty 1

## 2012-07-23 MED ORDER — SODIUM CHLORIDE 0.9 % IJ SOLN
3.0000 mL | Freq: Two times a day (BID) | INTRAMUSCULAR | Status: DC
  Administered 2012-07-23: 3 mL via INTRAVENOUS

## 2012-07-23 MED ORDER — ONDANSETRON HCL 4 MG PO TABS: 4.0000 mg | ORAL_TABLET | Freq: Four times a day (QID) | ORAL | Status: DC | PRN

## 2012-07-23 MED ORDER — MUPIROCIN 2 % EX OINT
1.0000 "application " | TOPICAL_OINTMENT | Freq: Two times a day (BID) | CUTANEOUS | Status: DC
  Administered 2012-07-23: 1 via NASAL
  Filled 2012-07-23: qty 22

## 2012-07-23 MED ORDER — CARVEDILOL 12.5 MG PO TABS
12.5000 mg | ORAL_TABLET | Freq: Two times a day (BID) | ORAL | Status: DC
  Administered 2012-07-23: 12.5 mg via ORAL
  Filled 2012-07-23 (×3): qty 1

## 2012-07-23 MED ORDER — DOXYCYCLINE HYCLATE 100 MG PO TABS
100.0000 mg | ORAL_TABLET | Freq: Two times a day (BID) | ORAL | Status: DC
  Administered 2012-07-23: 100 mg via ORAL
  Filled 2012-07-23 (×2): qty 1

## 2012-07-23 MED ORDER — CHLORHEXIDINE GLUCONATE CLOTH 2 % EX PADS: 6.0000 | MEDICATED_PAD | Freq: Every day | CUTANEOUS | Status: DC

## 2012-07-23 MED ORDER — DOCUSATE SODIUM 100 MG PO CAPS
100.0000 mg | ORAL_CAPSULE | Freq: Two times a day (BID) | ORAL | Status: DC
  Administered 2012-07-23: 100 mg via ORAL
  Filled 2012-07-23 (×2): qty 1

## 2012-07-23 MED ORDER — ONDANSETRON HCL 4 MG/2ML IJ SOLN: 4.0000 mg | Freq: Four times a day (QID) | INTRAMUSCULAR | Status: DC | PRN

## 2012-07-23 MED ORDER — PANTOPRAZOLE SODIUM 40 MG PO TBEC: 40.0000 mg | DELAYED_RELEASE_TABLET | Freq: Every day | ORAL | Status: DC

## 2012-07-23 MED ORDER — ASPIRIN 81 MG PO CHEW
81.0000 mg | CHEWABLE_TABLET | Freq: Two times a day (BID) | ORAL | Status: DC
  Administered 2012-07-23: 81 mg via ORAL
  Filled 2012-07-23: qty 1

## 2012-07-23 MED ORDER — ENOXAPARIN SODIUM 30 MG/0.3ML ~~LOC~~ SOLN
30.0000 mg | SUBCUTANEOUS | Status: DC
  Filled 2012-07-23: qty 0.3

## 2012-07-23 MED ORDER — DOCUSATE SODIUM 100 MG PO CAPS: 100.0000 mg | ORAL_CAPSULE | Freq: Two times a day (BID) | ORAL | Status: DC | PRN

## 2012-07-23 MED ORDER — HYDROXYZINE HCL 10 MG PO TABS
10.0000 mg | ORAL_TABLET | Freq: Every day | ORAL | Status: DC
  Filled 2012-07-23: qty 1

## 2012-07-23 MED ORDER — PANTOPRAZOLE SODIUM 40 MG PO TBEC
40.0000 mg | DELAYED_RELEASE_TABLET | Freq: Every day | ORAL | Status: DC
  Administered 2012-07-23: 40 mg via ORAL

## 2012-07-23 MED ORDER — HYDROCODONE-ACETAMINOPHEN 5-325 MG PO TABS: 1.0000 | ORAL_TABLET | ORAL | Status: DC | PRN

## 2012-07-23 NOTE — Discharge Summary (Addendum)
Physician Discharge Summary  Patient ID: Sara [name] MRN: [id number] DOB/AGE: 1918/11/15 78 y.o.  Admit date: 07/22/2012 Discharge date: 07/23/2012  Primary Care Physician:  Londell Moh, MD   Disposition and Follow-up:  PCP in 1 week Dr.Berry in 2 weeks    Discharge Diagnoses:   Principal Problem:  *Atrial fibrillation with RVR Active Problems:  Hypertension, uncontrolled on adm 03/20/12  Ankle pain, right  Cutaneous T cell lymphoma  Chronic diastolic CHF  Excessive bruising/bleeding      Medication List     As of 07/23/2012 11:07 AM    ASK your doctor about these medications         aspirin 81 MG chewable tablet   Chew 81 mg by mouth 2 (two) times daily.      carvedilol 12.5 MG tablet   Commonly known as: COREG   Take 12.5 mg by mouth 2 (two) times daily with a meal.      clopidogrel 75 MG tablet   Commonly known as: PLAVIX   Take 75 mg by mouth daily.      Co Q 10 100 MG Caps   Take 1 capsule by mouth daily.      diltiazem 120 MG 24 hr capsule   Commonly known as: CARDIZEM CD   Take 120 mg by mouth daily.      docusate sodium 100 MG capsule   Commonly known as: COLACE   Take 100 mg by mouth 2 (two) times daily as needed. For constipation      Fish Oil 1000 MG Caps   Take 1,000 mg by mouth daily.      folic acid 1 MG tablet   Commonly known as: FOLVITE   Take 1 mg by mouth daily.      furosemide 40 MG tablet   Commonly known as: LASIX   Take 40 mg by mouth every morning.      hydrOXYzine 10 MG tablet   Commonly known as: ATARAX/VISTARIL   Take 10 mg by mouth at bedtime.      lisinopril 40 MG tablet   Commonly known as: PRINIVIL,ZESTRIL   Take 1 tablet (40 mg total) by mouth daily.      pantoprazole 40 MG tablet   Commonly known as: PROTONIX   Take 40 mg by mouth daily.      spironolactone 25 MG tablet   Commonly known as: ALDACTONE   Take 12.5 mg by mouth daily.      vitamin C 500 MG tablet   Commonly known as: ASCORBIC  ACID   Take 500 mg by mouth daily.        Consults: Dr.Harding, [facility]   Significant Diagnostic Studies:  Dg Chest 2 View  07/23/2012  *RADIOLOGY REPORT*  Clinical Data: Chest pressure.  CHEST - 2 VIEW  Comparison: Chest radiograph performed earlier today at 11:10 p.m., and CT of the chest performed 06/02/2012  Findings: The lungs are hyperexpanded, with flattening of the hemidiaphragms, compatible with COPD.  Chronic peribronchial thickening is noted.  No focal consolidation, pleural effusion or pneumothorax is seen.  The heart is normal in size; the mediastinal contour is within normal limits.  No acute osseous abnormalities are seen. The patient is status post vertebroplasty at the lower thoracic spine. Calcification is noted along the thoracic abdominal aorta.  IMPRESSION: Findings of COPD; no acute cardiopulmonary process identified.   Original Report Authenticated By: Tonia Ghent, M.D.    Dg Chest Portable 1 View  07/22/2012  *RADIOLOGY  REPORT*  Clinical Data: Chest pain and pressure, CHF, a-fib  PORTABLE CHEST - 1 VIEW  Comparison: 03/20/2012; 01/12/2012; chest CT - 06/02/2012  Findings: Grossly unchanged cardiac silhouette and mediastinal contours with atherosclerotic calcifications within the thoracic aorta.  The lungs are hyperexpanded with flattening of bilateral hemidiaphragms.  There is mild diffuse thickening of the pulmonary interstitium.  No focal airspace opacity.  No definite pleural effusion or pneumothorax.  Unchanged bones including lower thoracic vertebroplasty/kyphoplasty.  IMPRESSION: Hyperexpanded lungs without definite acute cardiopulmonary disease on this AP portable examination.  Further evaluation with a PA and lateral chest radiograph may be obtained as clinically indicated.   Original Report Authenticated By: Tacey Ruiz, MD    Dg Foot Complete Right  07/23/2012  *RADIOLOGY REPORT*  Clinical Data: Right foot swelling with history of the lateral malleolar ulcer.   RIGHT FOOT COMPLETE - 3+ VIEW  Comparison: Ankle films on 12/07/2011.  Findings: Advanced degenerative changes are present in the toes. No evidence of bony destruction to suggest obvious osteomyelitis. No fractures or dislocations identified.  Soft tissues are unremarkable.  There is a hallux valgus deformity.  IMPRESSION: Degenerative changes without radiographic evidence to suggest osteomyelitis.   Original Report Authenticated By: Irish Lack, M.D.     Brief H and P:Sara [name] is a 78 y.o. 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  Presented with was eating dinner and all the sudden she had feeling of fullness and developed palpitations she took nitro without much help. Her blood pressure was elevated to 158/92. They called the nurse and it was noted her pulse was irregular.  NO chest pain per-se rather chest pressure. NO fever, chills, no shortness of breath.  She have a cold recently and have been coughing up some sputum.  Patient reports that she had had chronic right ankle pain that's worse on the dorsum of the ankle with some intermittent right foot swelling. She have had ulcers on the back of her right leg for which she has been taking antibiotics in the past with good improvement. She also developed a much smaller also on the side of her ankle which hurts her a lot worse. She thinks overall it has been going on since April.     Hospital Course:  1. Atrial fibrillation with RVR - with HR in 120-130s on admission, treated with IV cardizem then converted to NSR. Has remained in sinus rhythm since and  rate controlled on Coreg and Diltiazem  cardiac enzymes negative, Fu with Dr.Berry  Not a candidate for coumadin given advanced age and falls.   2. Excessive/easy bruising/cutaneous bleeding: discussed with Cardiology, continue Plavix , will DC ASA  3. Cutaneous T cell lymphoma: Followed by Dr.Granfortuna and Dr[facility]Olson at [facility], supposed to start interferon Tx soon  4. Hypertension - stable  5. Chronic diastolic CHF: compensated  6. .  Ankle pain, right - chronic, with very small ulcer: recommend local wound care, no evidence of cellulitis , no need to antibiotics at this time. Dopplers negative for DVT.    Time spent on Discharge:  Signed: Kaelin Bonelli [facility]  07/23/2012, 11:07 AM

## 2012-07-23 NOTE — Progress Notes (Signed)
CRITICAL VALUE ALERT  Critical value received:  Positive mrsa swab in nares  Date of notification:  07/23/12  Time of notification:  0657  Critical value read back:yes  Nurse who received alert:  Courtney Heys, RN  MD notified (1st page):    Time of first page:    MD notified (2nd page):  Time of second page:  Responding MD:    Time MD responded:

## 2012-07-23 NOTE — Consult Note (Signed)
Reason for Consult:  Referring Physician:   [name] is an 77 y.o. female.  HPI:   The patient is a 78 year old thin and frail appearing widowed, Caucasian female,  who was last seen in the office 04/05/12. She has a history of Afib,  hypertension, hyperlipidemia, and CAD, as well as PVOD. She has three vessel disease by cath, but she was thought to be not a good bypass candidate after being evaluated by Dr. Evelene Croon and medical therapy was recommended. She did have high grade left internal carotid artery stenosis, which was stented by Dr. Allyson Sabal on April 13, 2011, under the [facility] protocol with an excellent angiographic result and followup Dopplers performed earlier this year revealed it to be widely patent. She has severe psoriasis and has had strokes in the past as well. She was recently admitted for hypertensive urgency and congestive heart failure from July 8 to July 12 and was discharged home two weeks ago after her medicines were adjusted and she was diuresed. She feels clinically improved.  She presented last night with sudden onset of "feeling of fullness and developed palpitations".  She took nitro without much help. Her blood pressure was elevated to 158/92. They called the nurse and it was noted her pulse was irregular.  NO chest pain per-se rather chest pressure. NO fever, chills, no shortness of breath.  She have a cold recently and have been coughing up some sputum.  No syncope or near syncope, no melena/hematuria or hematochezia.  Easily bruising all over.   Past Medical History  Diagnosis Date  . Hypertension   . Atrial fibrillation   . Psoriasis   . Congestive heart failure (CHF)   . History of CVA (cerebrovascular accident)   . Coronary artery disease   . Myocardial infarction   . CHF (congestive heart failure)   . Stroke   . Shortness of breath   . H/O hiatal hernia   . Closed fracture of head of left humerus 5/12  . Basal cell carcinoma   . Mycosis fungoides  involving lymph nodes of axilla and upper limb 05/29/2012    Diffuse cutaneous rash; desquamation skin palms & soles; WBC 15,000 50% lymphs; Hb 12' plattlets 244,000.  Flow cytometry 04/04/12: 91% cells CD4 positive CD 26 negative  . Hyperlipemia   . Pneumonia     Past Surgical History  Procedure Date  . Tonsillectomy   . Dilation and curettage of uterus   . Eye surgery   . Cataracts   . Vertebroplasty     Family History  Problem Relation Age of Onset  . Stomach cancer Mother   . Pneumonia Father     Social History:  reports that she has never smoked. She has never used smokeless tobacco. She reports that she does not drink alcohol or use illicit drugs.  Allergies:  Allergies  Allergen Reactions  . Penicillins Other (See Comments)    unknown  . Triamterene Other (See Comments)    unknown  . Alendronate Sodium Rash    Medications:    . carvedilol  12.5 mg Oral BID WC  . Chlorhexidine Gluconate Cloth  6 each Topical Q0600  . clopidogrel  75 mg Oral Q breakfast  . diltiazem  120 mg Oral Daily  . docusate sodium  100 mg Oral BID  . doxycycline  100 mg Oral Q12H  . enoxaparin (LOVENOX) injection  30 mg Subcutaneous Q24H  . hydrOXYzine  10 mg Oral QHS  . mupirocin ointment  1 application Nasal BID  . pantoprazole  40 mg Oral Q1200  . sodium chloride  3 mL Intravenous Q12H  . [DISCONTINUED] aspirin  81 mg Oral BID  . [DISCONTINUED] pantoprazole  40 mg Oral Daily     Results for orders placed during the hospital encounter of 07/22/12 (from the past 48 hour(s))  CBC     Status: Abnormal   Collection Time   07/22/12 10:58 PM      Component Value Range Comment   WBC 18.1 (*) 4.0 - 10.5 K/uL    RBC 3.72 (*) 3.87 - 5.11 MIL/uL    Hemoglobin 11.7 (*) 12.0 - 15.0 g/dL    HCT 81.1 (*) 91.4 - 46.0 %    MCV 92.7  78.0 - 100.0 fL    MCH 31.5  26.0 - 34.0 pg    MCHC 33.9  30.0 - 36.0 g/dL    RDW 78.2  95.6 - 21.3 %    Platelets 292  150 - 400 K/uL   POCT I-STAT TROPONIN I      Status: Normal   Collection Time   07/22/12 11:16 PM      Component Value Range Comment   Troponin i, poc 0.02  0.00 - 0.08 ng/mL    Comment 3            POCT I-STAT, CHEM 8     Status: Abnormal   Collection Time   07/22/12 11:18 PM      Component Value Range Comment   Sodium 142  135 - 145 mEq/L    Potassium 4.2  3.5 - 5.1 mEq/L    Chloride 105  96 - 112 mEq/L    BUN 25 (*) 6 - 23 mg/dL    Creatinine, Ser 0.86  0.50 - 1.10 mg/dL    Glucose, Bld 578 (*) 70 - 99 mg/dL    Calcium, Ion 4.69  6.29 - 1.30 mmol/L    TCO2 24  0 - 100 mmol/L    Hemoglobin 11.9 (*) 12.0 - 15.0 g/dL    HCT 52.8 (*) 41.3 - 46.0 %   MRSA PCR SCREENING     Status: Abnormal   Collection Time   07/23/12  5:10 AM      Component Value Range Comment   MRSA by PCR POSITIVE (*) NEGATIVE   MAGNESIUM     Status: Normal   Collection Time   07/23/12  6:45 AM      Component Value Range Comment   Magnesium 2.1  1.5 - 2.5 mg/dL   PHOSPHORUS     Status: Normal   Collection Time   07/23/12  6:45 AM      Component Value Range Comment   Phosphorus 3.5  2.3 - 4.6 mg/dL   COMPREHENSIVE METABOLIC PANEL     Status: Abnormal   Collection Time   07/23/12  6:45 AM      Component Value Range Comment   Sodium 143  135 - 145 mEq/L    Potassium 3.6  3.5 - 5.1 mEq/L    Chloride 108  96 - 112 mEq/L    CO2 27  19 - 32 mEq/L    Glucose, Bld 97  70 - 99 mg/dL    BUN 20  6 - 23 mg/dL    Creatinine, Ser 2.44  0.50 - 1.10 mg/dL    Calcium 9.2  8.4 - 01.0 mg/dL    Total Protein 6.2  6.0 - 8.3 g/dL    Albumin 3.1 (*)  3.5 - 5.2 g/dL    AST 18  0 - 37 U/L    ALT 17  0 - 35 U/L    Alkaline Phosphatase 52  39 - 117 U/L    Total Bilirubin 0.2 (*) 0.3 - 1.2 mg/dL    GFR calc non Af Amer 57 (*) >90 mL/min    GFR calc Af Amer 65 (*) >90 mL/min   CBC     Status: Abnormal   Collection Time   07/23/12  6:45 AM      Component Value Range Comment   WBC 17.0 (*) 4.0 - 10.5 K/uL    RBC 3.68 (*) 3.87 - 5.11 MIL/uL    Hemoglobin 11.1 (*)  12.0 - 15.0 g/dL    HCT 16.1 (*) 09.6 - 46.0 %    MCV 91.8  78.0 - 100.0 fL    MCH 30.2  26.0 - 34.0 pg    MCHC 32.8  30.0 - 36.0 g/dL    RDW 04.5  40.9 - 81.1 %    Platelets 280  150 - 400 K/uL   TROPONIN I     Status: Normal   Collection Time   07/23/12  6:45 AM      Component Value Range Comment   Troponin I <0.30  <0.30 ng/mL     Dg Chest 2 View  07/23/2012  *RADIOLOGY REPORT*  Clinical Data: Chest pressure.  CHEST - 2 VIEW  Comparison: Chest radiograph performed earlier today at 11:10 p.m., and CT of the chest performed 06/02/2012  Findings: The lungs are hyperexpanded, with flattening of the hemidiaphragms, compatible with COPD.  Chronic peribronchial thickening is noted.  No focal consolidation, pleural effusion or pneumothorax is seen.  The heart is normal in size; the mediastinal contour is within normal limits.  No acute osseous abnormalities are seen. The patient is status post vertebroplasty at the lower thoracic spine. Calcification is noted along the thoracic abdominal aorta.  IMPRESSION: Findings of COPD; no acute cardiopulmonary process identified.   Original Report Authenticated By: Tonia Ghent, M.D.    Dg Chest Portable 1 View  07/22/2012  *RADIOLOGY REPORT*  Clinical Data: Chest pain and pressure, CHF, a-fib  PORTABLE CHEST - 1 VIEW  Comparison: 03/20/2012; 01/12/2012; chest CT - 06/02/2012  Findings: Grossly unchanged cardiac silhouette and mediastinal contours with atherosclerotic calcifications within the thoracic aorta.  The lungs are hyperexpanded with flattening of bilateral hemidiaphragms.  There is mild diffuse thickening of the pulmonary interstitium.  No focal airspace opacity.  No definite pleural effusion or pneumothorax.  Unchanged bones including lower thoracic vertebroplasty/kyphoplasty.  IMPRESSION: Hyperexpanded lungs without definite acute cardiopulmonary disease on this AP portable examination.  Further evaluation with a PA and lateral chest radiograph may  be obtained as clinically indicated.   Original Report Authenticated By: Tacey Ruiz, MD     ROS Review of Systems - Negative except pertinent positives / negatives noted above.   A comprehensive Review was performed.    Blood pressure 168/71, pulse 70, temperature 97.8 F (36.6 C), temperature source Oral, resp. rate 18, height 5\' 6"  (1.676 m), weight 62.324 kg (137 lb 6.4 oz), SpO2 98.00%. Physical Exam General appearance: alert, cooperative, appears stated age and no distress Neck: no adenopathy, no carotid bruit, no JVD, supple, symmetrical, trachea midline and thyroid not enlarged, symmetric, no tenderness/mass/nodules Lungs: clear to auscultation bilaterally, normal percussion bilaterally and non-labored Heart: regular rate and rhythm, S1, S2 normal, no murmur, click, rub or gallop Abdomen: soft, non-tender;  bowel sounds normal; no masses,  no organomegaly Extremities: extremities normal, atraumatic, no cyanosis or edema and R ankle hurts - painful small wound with bandage on it.   Pulses: somewhat diminished pedal pulses bilaterally ~1+ Skin: diffuse ecchymoses Neurologic: Grossly normal  Assessment/Plan: Patient Active Hospital Problem List: Atrial fibrillation with RVR (07/23/2012) - spontaneously converted. Hypertension, uncontrolled on adm 03/20/12 () Chest pain (07/23/2012) Ankle pain, right (07/23/2012)  HAGER, BRYAN 07/23/2012, 9:39 AM   I seen and evaluated the patient this morning along with the PA/NP. I agree with their findings, examination as well as impression recommendations.  Admitted for Afib RVR - spontaneously converted overnight.  Feels good & would like to go "home."  She is not on coumadin - bleeding risk. BP & HR are now stable.  We were asked to provide guidance re ASA+Plavix in light of her diffuse ecchymoses & easy bleeding related to her Cutaneous T-Cell Lymphoma -- I suggested Plavix alone with no ASA - although, with her recurrent CVAs earlier this  year, this is a risk.  Will readdress in OP with Dr. Allyson Sabal.  Marykay Lex, M.D., M.S. [facility] [address] [place], [place]  [address]  [phone] Pager # [phone] 07/23/2012 2:26 PM

## 2012-07-23 NOTE — Progress Notes (Signed)
VASCULAR LAB PRELIMINARY  PRELIMINARY  PRELIMINARY  PRELIMINARY  Bilateral lower extremity venous Dopplers completed.    Preliminary report:  There is no DVT or SVT noted in the bilateral lower extremities.  Ancelmo Hunt, 07/23/2012, 10:44 AM

## 2012-07-23 NOTE — Progress Notes (Signed)
Utilization review completed.  

## 2012-07-23 NOTE — ED Notes (Signed)
Report called to [facility] RN  Room [phone]

## 2012-07-23 NOTE — H&P (Signed)
PCP:   Londell Moh, MD  Cardiology Dr. Allyson Sabal  Chief Complaint:   Chest pain  HPI: Sara [name] is a 78 y.o. 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   Presented with  Was eating dinner and all the sudden she had feeling of fullness and developed palpitations she took nitro without much help. Her blood pressure was elevated to 158/92. They called the nurse and it was noted her pulse was irregular.  NO chest pain per-se rather chest pressure. NO fever, chills, no shortness of breath.  She have a cold recently and have been coughing up some sputum.   Patient reports that she had had chronic right ankle pain that's worse on the dorsum of the ankle with some intermittent right foot swelling. She have had ulcers on the back of her right leg for which she has been taking antibiotics in the past with good improvement. She also developed a much smaller also on the side of her ankle which hurts her a lot worse. She thinks overall it has been going on since April.   Review of Systems:     Pertinent positives include: palpitations,  nasal congestion, productive cough,   Constitutional:  No weight loss, night sweats, Fevers, chills, fatigue, weight loss  HEENT:  No headaches, Difficulty swallowing,Tooth/dental problems,Sore throat,  No sneezing, itching, ear ache post nasal drip,  Cardio-vascular:  No chest pain, Orthopnea, PND, anasarca, .no Bilateral lower extremity swelling  GI:  No heartburn, indigestion, abdominal pain, nausea, vomiting, diarrhea, change in bowel habits, loss of appetite,  melena, blood in stool, hematemesis Resp:  no shortness of breath at rest. No dyspnea on exertion, No excess mucus, noNo non-productive cough, No coughing up of blood.No change in color of mucus.No wheezing. Skin:  no rash or lesions. No jaundice GU:  no dysuria, change in color of urine, no urgency or frequency. No straining to urinate.  No flank pain.  Musculoskeletal:  No joint pain or no joint swelling. No decreased range of motion. No back pain.  Psych:  No change in mood or affect. No depression or anxiety. No memory loss.  Neuro: no localizing neurological complaints, no tingling, no weakness, no double vision, no gait abnormality, no slurred speech, no confusion  Otherwise ROS are negative except for above, 10 systems were reviewed  Past Medical History: Past Medical History  Diagnosis Date  . Hypertension   . Atrial fibrillation   . Psoriasis   . Congestive heart failure (CHF)   . History of CVA (cerebrovascular accident)   . Coronary artery disease   . Myocardial infarction   . CHF (congestive heart failure)   . Stroke   . Shortness of breath   . H/O hiatal hernia   . Closed fracture of head of left humerus 5/12  . Basal cell carcinoma   . 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  Flow cytometry 04/04/12: 91% cells CD4 positive CD 26 negative   Past Surgical History  Procedure Date  . Tonsillectomy   . Dilation and curettage  of uterus   . Eye surgery   . Cataracts   . Vertebroplasty      Medications: Prior to Admission medications   Medication Sig Start Date End Date Taking? Authorizing Provider  aspirin 81 MG chewable tablet Chew 81 mg by mouth 2 (two) times daily.   Yes Historical Provider, MD  carvedilol (COREG) 12.5 MG tablet Take 12.5 mg by mouth 2 (two) times daily with a meal.   Yes Historical Provider, MD    clopidogrel (PLAVIX) 75 MG tablet Take 75 mg by mouth daily.   Yes Historical Provider, MD  Coenzyme Q10 (CO Q 10) 100 MG CAPS Take 1 capsule by mouth daily.   Yes Historical Provider, MD  diltiazem (CARDIZEM CD) 120 MG 24 hr capsule Take 120 mg by mouth daily.   Yes Historical Provider, MD  docusate sodium (COLACE) 100 MG capsule Take 100 mg by mouth 2 (two) times daily as needed. For constipation   Yes Historical Provider, MD  folic acid (FOLVITE) 1 MG tablet Take 1 mg by mouth daily.   Yes Historical Provider, MD  furosemide (LASIX) 40 MG tablet Take 40 mg by mouth every morning.   Yes Historical Provider, MD  hydrOXYzine (ATARAX/VISTARIL) 10 MG tablet Take 10 mg by mouth at bedtime.    Yes Historical Provider, MD  lisinopril (PRINIVIL,ZESTRIL) 40 MG tablet Take 1 tablet (40 mg total) by mouth daily. 03/24/12  Yes Clydia Llano, MD  Omega-3 Fatty Acids (FISH OIL) 1000 MG CAPS Take 1,000 mg by mouth daily.    Yes Historical Provider, MD  pantoprazole (PROTONIX) 40 MG tablet Take 40 mg by mouth daily.   Yes Historical Provider, MD  spironolactone (ALDACTONE) 25 MG tablet Take 12.5 mg by mouth daily.   Yes Historical Provider, MD  vitamin C (ASCORBIC ACID) 500 MG tablet Take 500 mg by mouth daily.   Yes Historical Provider, MD    Allergies:   Allergies  Allergen Reactions  . Penicillins Other (See Comments)    unknown  . Triamterene Other (See Comments)    unknown  . Alendronate Sodium Rash    Social History:  Ambulatory with  Environmental consultant  Lives at [place] [facility]   reports that she has never smoked. She has never used smokeless tobacco. She reports that she does not drink alcohol or use illicit drugs.   Family History: family history includes Pneumonia in her father and Stomach cancer in her mother.    Physical Exam: Patient Vitals for the past 24 hrs:  BP Temp Temp src Pulse Resp SpO2  07/22/12 2257 118/81 mmHg 97.9 F (36.6 C) Oral 122  16  98 %     1. General:  in No Acute distress 2. Psychological: Alert and Oriented 3. Head/ENT:     Dry Mucous Membranes                          Head Non traumatic, neck supple                          Normal  Dentition 4. SKIN:decreased Skin turgor,  Skin clean Dry diffuse red scaling rash present, Ulceration on the right leg, on the back of the calf there is a  3 x 2.5 cm ulceration with some discharge. A much smaller also noted on the lateral aspect of the ankle which appears to be mildly infected. 5. Heart: Regular rate  and rhythm no Murmur, Rub or gallop 6. Lungs: Clear to auscultation bilaterally, no wheezes or crackles   7. Abdomen: Soft, non-tender, Non distended 8. Lower extremities: no clubbing, cyanosis, or edema 9. Neurologically Grossly intact, moving all 4 extremities equally 10. MSK: Normal range of motion  body mass index is unknown because there is no height or weight on file.   Labs on Admission:   [facility] 07/22/12 2318  NA 142  K 4.2  CL 105  CO2 --  GLUCOSE 126*  BUN 25*  CREATININE 1.10  CALCIUM --  MG --  PHOS --   No results found for this basename: AST:2,ALT:2,ALKPHOS:2,BILITOT:2,PROT:2,ALBUMIN:2 in the last 72 hours No results found for this basename: LIPASE:2,AMYLASE:2 in the last 72 hours  Basename 07/22/12 2318 07/22/12 2258  WBC -- 18.1*  NEUTROABS -- --  HGB 11.9* 11.7*  HCT 35.0* 34.5*  MCV -- 92.7  PLT -- 292   No results found for this basename: CKTOTAL:3,CKMB:3,CKMBINDEX:3,TROPONINI:3 in the last 72 hours No results found for this basename: TSH,T4TOTAL,FREET3,T3FREE,THYROIDAB in the last 72 hours No results found for this basename: VITAMINB12:2,FOLATE:2,FERRITIN:2,TIBC:2,IRON:2,RETICCTPCT:2 in the last 72 hours Lab Results  Component Value Date   HGBA1C 6.1* 12/08/2011    The CrCl is unknown because both a height and weight (above a minimum accepted value) are required for this calculation. ABG    Component Value Date/Time   PHART 7.407  03/20/2012 0259   HCO3 26.3* 03/20/2012 0259   TCO2 24 07/22/2012 2318   O2SAT 99.6 03/20/2012 0259    Other results:  I have pearsonaly reviewed this: ECG REPORT  Rate: 129   Rhythm: A. fibrilation ST&T Change; Depressed segment  In ST segment in lateral leads   Cultures:    Component Value Date/Time   SDES URINE, RANDOM 03/20/2012 1350   SPECREQUEST NONE 03/20/2012 1350   CULT NO GROWTH 03/20/2012 1350   REPTSTATUS 03/22/2012 FINAL 03/20/2012 1350       Radiological Exams on Admission: Dg Chest 2 View  07/23/2012  *RADIOLOGY REPORT*  Clinical Data: Chest pressure.  CHEST - 2 VIEW  Comparison: Chest radiograph performed earlier today at 11:10 p.m., and CT of the chest performed 06/02/2012  Findings: The lungs are hyperexpanded, with flattening of the hemidiaphragms, compatible with COPD.  Chronic peribronchial thickening is noted.  No focal consolidation, pleural effusion or pneumothorax is seen.  The heart is normal in size; the mediastinal contour is within normal limits.  No acute osseous abnormalities are seen. The patient is status post vertebroplasty at the lower thoracic spine. Calcification is noted along the thoracic abdominal aorta.  IMPRESSION: Findings of COPD; no acute cardiopulmonary process identified.   Original Report Authenticated By: Tonia Ghent, M.D.    Dg Chest Portable 1 View  07/22/2012  *RADIOLOGY REPORT*  Clinical Data: Chest pain and pressure, CHF, a-fib  PORTABLE CHEST - 1 VIEW  Comparison: 03/20/2012; 01/12/2012; chest CT - 06/02/2012  Findings: Grossly unchanged cardiac silhouette and mediastinal contours with atherosclerotic calcifications within the thoracic aorta.  The lungs are hyperexpanded with flattening of bilateral hemidiaphragms.  There is mild diffuse thickening of the pulmonary interstitium.  No focal airspace opacity.  No definite pleural effusion or pneumothorax.  Unchanged bones including lower thoracic vertebroplasty/kyphoplasty.  IMPRESSION:  Hyperexpanded lungs without definite acute cardiopulmonary disease on this AP portable examination.  Further evaluation with a PA and lateral chest radiograph may be obtained as clinically indicated.   Original Report Authenticated By: Tacey Ruiz, MD  Chart has been reviewed  Assessment/Plan  78 yo w history of paroxysmal A.fib. Here with A. Fib with RVR with associated chest discomfort and ECG changes now back to sinus rhythm and resolution of her symptoms.   Present on Admission:  . Atrial fibrillation with RVR - Currently resolved with continue home medications, cycle cardiac enzymes given chest discomfort and abnormal ECG. NO a candidate for coumadin given advanced age and falls. Check TSH. Marland Kitchen Hypertension - curenly down to 118 will hold of on spironolactone for now . Chest pain - associated with a.fib w rvr now resolved. Will cycle cardiac enzymes . Ankle pain, right - chronic, will obtain ankle film. Sed rate will likely be elevated given hx of T cell cutaneous lymphoma Given swelling would obtain doppler to RO DVT. Treat superficially infected ulcer with doxycycline for now.   Prophylaxis:   Lovenox, Protonix  CODE STATUS: DNR/DNI  Other plan as per orders.  I have spent a total of 55 min on this admission  Kaliel Bolds 07/23/2012, 1:07 AM

## 2012-07-24 LAB — GLUCOSE, CAPILLARY

## 2012-07-25 ENCOUNTER — Ambulatory Visit (HOSPITAL_BASED_OUTPATIENT_CLINIC_OR_DEPARTMENT_OTHER): Payer: Medicare Other | Admitting: Oncology

## 2012-07-25 ENCOUNTER — Telehealth: Payer: Self-pay | Admitting: Oncology

## 2012-07-25 VITALS — BP 155/65 | HR 70 | Temp 98.0°F | Resp 20 | Ht 66.0 in | Wt 139.3 lb

## 2012-07-25 DIAGNOSIS — I10 Essential (primary) hypertension: Secondary | ICD-10-CM

## 2012-07-25 DIAGNOSIS — C8419 Sezary disease, extranodal and solid organ sites: Secondary | ICD-10-CM

## 2012-07-25 DIAGNOSIS — C8404 Mycosis fungoides, lymph nodes of axilla and upper limb: Secondary | ICD-10-CM

## 2012-07-25 DIAGNOSIS — L408 Other psoriasis: Secondary | ICD-10-CM

## 2012-07-25 NOTE — Progress Notes (Signed)
Hematology and Oncology Follow Up Visit  Sara [name] [id number] 1919-07-03 78 y.o. 07/25/2012 5:19 PM   Principle Diagnosis: Encounter Diagnosis  Name Primary?  . 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 I initially thought this was chronic lymphocytic leukemia. However the white cells had an atypical appearance. Flow cytometry was done and suggested that this is a T-cell lymphoproliferative process. The changes on the patient's skin looked like they could very well be mycosis fungoides. She had a single clinically palpable 3-4 cm lymph node in the right axilla. No other areas of adenopathy or organomegaly on CT scans. I initially planned to start her on  a trial of vitamin A derivative Targretin however, given the rarity of this cutaneous T-cell lymphoma and the availability of a local expert, I suggested to the patient that she get a consultation by Dr. Junita Push at [facility]. I discussed the situation with Dr. Constance Goltz by phone. She had a complete evaluation at [facility] including multiple skin biopsies which confirmed that she has mycosis fungoides/Sezary syndrome. Dr. Constance Goltz did not feel that Targretin was active for systemic disease and recommended instead a combination of alpha interferon at a dose of 1.5 million units subcutaneous 3 times weekly with a concomitant UV light treatments. Chlorambucil offered as an alternative to the  interferon if it is not tolerated  I spent our time today reviewing with her and her daughter, potential side effects of interferon. Her local dermatologist is no longer providing ultraviolet light treatments so she will get a referral to another group who does. Given her age and distance to the [facility] she would like to have most of her  treatments done here in [place].      Medications: reviewed  Allergies:  Allergies  Allergen Reactions  . Penicillins Other (See Comments)    unknown  . Triamterene Other (See Comments)    unknown  . Alendronate Sodium Rash    Review of Systems: Constitutional:   Chronic fatigue Respiratory: No cough or dyspnea Cardiovascular:  Flareup of chronic atrial fibrillation the other day requiring overnight hospitalization Gastrointestinal: No GI complaints Genito-Urinary: No GU complaints Musculoskeletal: Neurologic: There is no history of depression. Skin: Persistent discomfort and pain due to the extensive rash affecting palms and soles as well as her trunk and extremities Remaining ROS negative.  Physical Exam: Blood pressure 155/65, pulse 70, temperature 98 F (36.7 C), temperature source Oral, resp. rate 20, height 5\' 6"  (1.676 m), weight 139 lb 4.8 oz (63.186 kg). Wt Readings from Last 3 Encounters:  07/25/12 139 lb 4.8 oz (63.186 kg)  07/23/12 137 lb 6.4 oz (62.324 kg)  07/11/12 137 lb 3.2 oz (62.234 kg)     General appearance: Elderly Caucasian woman HENNT: Pharynx no erythema exudate or ulcer Lymph nodes: Persistent, single, area of lymphadenopathy right axilla Breasts: Lungs: Clear to auscultation with minimal rales at the bases, resonant to percussion Heart: Currently with a regular rhythm Abdomen: Soft, nontender Extremities: No edema, no calf tenderness Vascular: No cyanosis Neurologic: Mental status intact, cranial nerves grossly normal, motor strength 5 over 5, reflexes 1+ symmetric, PERRLA Skin: Diffuse erythematous rash involving her entire body except for her face with pronounced desquamation of the palms and soles and breaks in the skin causing significant pain  Lab Results: Lab Results  Component Value Date   WBC 17.0* 07/23/2012   HGB 11.1* 07/23/2012   HCT 33.8* 07/23/2012   MCV 91.8 07/23/2012   PLT 280 07/23/2012     Chemistry        Component Value Date/Time   NA 143 07/23/2012 0645   NA 139 07/11/2012 1521   K 3.6 07/23/2012 0645   K 4.5 07/11/2012 1521   CL 108 07/23/2012 0645   CL 106 07/11/2012 1521   CO2 27 07/23/2012 0645   CO2 29 07/11/2012 1521   BUN 20 07/23/2012 0645   BUN 27.0* 07/11/2012 1521   CREATININE 0.87 07/23/2012 0645   CREATININE 1.1 07/11/2012 1521      Component Value Date/Time   CALCIUM 9.2 07/23/2012 0645   CALCIUM 9.8 07/11/2012 1521   ALKPHOS 52 07/23/2012 0645   ALKPHOS 56 07/11/2012 1521   AST 18 07/23/2012 0645   AST 22 07/11/2012 1521   ALT 17 07/23/2012 0645   ALT 21 07/11/2012 1521   BILITOT 0.2* 07/23/2012 0645   BILITOT 0.32 07/11/2012 1521       Radiological Studies: Dg Chest 2 View  07/23/2012  *RADIOLOGY REPORT*  Clinical Data: Chest pressure.  CHEST - 2 VIEW  Comparison: Chest radiograph performed earlier today at 11:10 p.m., and CT of the chest performed 06/02/2012  Findings: The lungs are hyperexpanded, with flattening of the hemidiaphragms, compatible with COPD.  Chronic peribronchial thickening is noted.  No focal consolidation, pleural effusion or pneumothorax is seen.  The heart is normal in size; the mediastinal contour is within normal limits.  No acute osseous abnormalities are seen. The patient is status post vertebroplasty at the lower thoracic spine. Calcification is noted along the thoracic abdominal aorta.  IMPRESSION: Findings of COPD; no acute cardiopulmonary process identified.   Original Report Authenticated By: Tonia Ghent, M.D.    Dg Chest Portable 1 View  07/22/2012  *RADIOLOGY REPORT*  Clinical Data: Chest pain and pressure, CHF, a-fib  PORTABLE CHEST - 1 VIEW  Comparison: 03/20/2012; 01/12/2012; chest CT - 06/02/2012  Findings: Grossly unchanged cardiac silhouette and mediastinal contours with atherosclerotic calcifications within the thoracic aorta.  The lungs are hyperexpanded with flattening of bilateral hemidiaphragms.  There is mild diffuse  thickening of the pulmonary interstitium.  No focal airspace opacity.  No definite pleural effusion or pneumothorax.  Unchanged bones including lower thoracic vertebroplasty/kyphoplasty.  IMPRESSION: Hyperexpanded lungs without definite acute cardiopulmonary disease on this AP portable examination.  Further evaluation with a PA and lateral chest radiograph may be obtained as clinically indicated.   Original Report Authenticated By: Tacey Ruiz, MD    Dg Foot Complete Right  07/23/2012  *RADIOLOGY REPORT*  Clinical Data: Right foot swelling with history of the lateral malleolar ulcer.  RIGHT FOOT COMPLETE - 3+ VIEW  Comparison: Ankle films on 12/07/2011.  Findings: Advanced degenerative changes are present in the toes. No evidence of bony destruction to suggest obvious osteomyelitis. No fractures or dislocations identified.  Soft tissues are unremarkable.  There is a hallux valgus deformity.  IMPRESSION: Degenerative changes without radiographic evidence to suggest osteomyelitis.   Original Report Authenticated By: Irish Lack, M.D.     Impression and Plan: #1. Mycosis fungoides. Plan: Trial of UVB light plus alpha interferon low-dose as outlined above. [facility]. I will make arrangements to have blood work checked on a weekly basis for the first month that she is on treatment and then monthly thereafter if everything  is stable. She is encouraged to call for any questions or problems before her next visit which is scheduled just after the Christmas holidays. She will have a reevaluation at [facility] in December. She has agreed to participate in a database on [facility] patients and I will forward all pertinent records to Dr Corky Downs as they accumulate.  #2. Chronic atrial fibrillation  #3. Chronic psoriasis  #4. Chronic congestive heart failure  #5. History of cerebrovascular accident.  #6. Coronary artery disease with history of MI in the past.  #7. Essential  hypertension   CC:. Dr. Merri Brunette; Dr. Anda Kraft; Dr. Leverne Humbles, MD 11/12/20135:19 PM

## 2012-07-25 NOTE — Patient Instructions (Signed)
One week after starting Interferon, will will check labwork weekly for the first month then monthly if otherwise stable. [facility] can help with some of the lab We will see you back for a visit on September 15, 2012 at 2:30 PM come 30 minutes early for lab Call for any interim problems We will work on getting you an Passenger transport manager

## 2012-07-25 NOTE — Telephone Encounter (Signed)
gv and printed pt appt schedule for Jan 3. 2014 for lab and est.

## 2012-07-26 ENCOUNTER — Telehealth: Payer: Self-pay | Admitting: *Deleted

## 2012-07-26 NOTE — Telephone Encounter (Signed)
Called patient re: script for electric wheelchair.  She would like [place] to fax it to [facility] @ [phone].  Fax completed.

## 2012-07-27 ENCOUNTER — Telehealth: Payer: Self-pay | Admitting: *Deleted

## 2012-07-27 NOTE — Telephone Encounter (Signed)
Patient called yesterday saying she could not get her interferon.  Called pharmacy at [facility] [phone].  Kim/pharmacist said they had called Dr. Lonell Face office for help with authorization, but had not heard back.   Called MargeAnn/ nurse for Dr. Constance Goltz @ [phone].  She said they had received the authorization for the interferon given the first 2 weeks on M-W-F, but not the authorization for the pet-interferon given weekly after that.  If that authorization is not received, they would just continue with the  M-W-F dosing and MargeAnn said for the patient to go ahead and get started and to give her a call at this number.   Called patient and let her know.  She will call [facility] and make sure she can pick up regular interferon and have nurse at [facility] demonstrate how to give it to herself as she would like to administer this to herself.  After she has a firm date from [facility] pharmacy about when she can pick this up to start, she will call MargeAnn and let her know the start date.  Or if she has any problems with the authorization at the pharmacy, she can discuss with [facility].  This situation is a little complicated, however patient demonstrated a good understanding of the circumstances.  She knows she can call us or MargeAnn if anything is not clear or does not happen like she thinks it should.  She verbalizes that she is anxious to get started on this medication.

## 2012-08-03 ENCOUNTER — Telehealth: Payer: Self-pay | Admitting: *Deleted

## 2012-08-03 NOTE — Telephone Encounter (Signed)
Script given to pt 07/25/12 for CBC, diff, CMet weekly x 4 then monthly to start 1 week after initiation of interferon & fax to [phone] per Dr Cyndie Chime.

## 2012-08-04 ENCOUNTER — Telehealth: Payer: Self-pay | Admitting: Oncology

## 2012-08-04 NOTE — Telephone Encounter (Signed)
Faxed reports to Dr. Betsy Coder @ [phone].

## 2012-08-15 ENCOUNTER — Telehealth: Payer: Self-pay | Admitting: *Deleted

## 2012-08-15 ENCOUNTER — Encounter: Payer: Self-pay | Admitting: Oncology

## 2012-08-15 NOTE — Telephone Encounter (Signed)
Received call from [facility]/Hospice today stating that Dr. Lajoyce Corners made a referral for this pt to hospice & she wanted Dr Patsy Lager input.  She reports that pt has some ulcerations & some lacerations from a fall.  Lynn/Hospice also called & spoke to our triage dept/Neilsa with same concerns.   Notified Chip Boer that pt is not hospice appropriate per Dr Cyndie Chime & pt needs to start interferon.   Spoke to daughter of pt/Eileen @ 4pm & she reports pt is not doing well & she is having to stay with her & she thinks pt is hospice appropriate & hospice nurse is there now & nurse also things she is appropriate.  The daughter mentions some cardiac issues.  Discussed with Dr. Cyndie Chime & he will talk with the daughter.

## 2012-08-15 NOTE — Progress Notes (Signed)
I received a call this afternoon from a hospice nurse who is visiting the [facility] where [name] resides. This was quite unexpected. When I last saw the patient on November 12 she was doing very well except for her skin disorder (recently diagnosed cutaneous T-cell lymphoma). She had been to [facility] for consultation with a recommendation to start low-dose interferon subcutaneous 3 times weekly. I assumed that this was started already. She was in to see her orthopedic surgeon on November 27. She has been followed for a chronic nonhealing venous stasis ulceration of her right lower extremity. She recently sustained a laceration to her posterior thigh. There is no indication from that note that her overall condition was deteriorating but the surgeon set her up for a hospice consultation.  I called her daughter. Unfortunately, her condition has deteriorated over the last few weeks. She is having pain and mobility issues with her right leg. The daughter has been staying with her continuously. She does not feel that she is strong enough to start the interferon. Dr. Lajoyce Corners told her that interferon would interfere with wound healing. I was not aware of this. I have used interferon for many years. It can cause a number of constitutional symptoms with fevers, and myalgias. It can cause mild to moderate suppression of normal blood counts.  In addition to problems with the wounds noted above, she has had a flareup of cardiac symptoms. She was seen in the emergency department and told that she may have an early pneumonia. They recommended either hospital admission or short interval followup with her primary care physician. She did see Dr.Pharr yesterday.  Although there is an option to move from independent living to assisted living and full skilled nursing at the friend[facility] home, the daughter says that this has not worked out in the past and when she was moved to assisted living she did not feel that she got the care that  she needed.  Under the circumstances, if her overall performance status has deteriorated significantly in the last few weeks due to other comorbid medical conditions, then I would certainly agree with holding the interferon.  I received a call from Dr. Reggy Eye before I had a chance to talk with the daughter. Based on previous information about about the patient, I did not think that she was hospice appropriate. After talking with her daughter, I think she might be hospice appropriate but I told the daughter to find out exactly what services could be provided to see if this would meet her needs. I have no objection to hospice services but I do feel that her primary care physician needs to have input into this decision as well.

## 2012-09-14 ENCOUNTER — Telehealth: Payer: Self-pay | Admitting: *Deleted

## 2012-09-14 NOTE — Telephone Encounter (Signed)
Received call from [facility]Nurse[facility] stating that pt wants to see Dr. Cyndie Chime & she had cancelled appt for tomorrow by mistake.  She doesn't want to go to [facility].  She needs an early appt due to transportation.  She has not started interferon.  She reports wound issues & is in their skilled care area now.  Note to Dr. Cyndie Chime.

## 2012-09-15 ENCOUNTER — Other Ambulatory Visit: Payer: Medicare Other | Admitting: Lab

## 2012-09-15 ENCOUNTER — Ambulatory Visit: Payer: Medicare Other | Admitting: Oncology

## 2012-09-16 ENCOUNTER — Other Ambulatory Visit: Payer: Self-pay | Admitting: Oncology

## 2012-09-16 DIAGNOSIS — C8404 Mycosis fungoides, lymph nodes of axilla and upper limb: Secondary | ICD-10-CM

## 2012-09-18 ENCOUNTER — Telehealth: Payer: Self-pay | Admitting: *Deleted

## 2012-09-18 NOTE — Telephone Encounter (Signed)
Pt aware of appt for 09/25/12 lab and md, per [facility] RN

## 2012-09-18 NOTE — Telephone Encounter (Signed)
Notified [facility] of appt 09/25/12 --9:30am lab & 10 am MD.

## 2012-09-25 ENCOUNTER — Other Ambulatory Visit (HOSPITAL_BASED_OUTPATIENT_CLINIC_OR_DEPARTMENT_OTHER): Payer: Medicare Other | Admitting: Lab

## 2012-09-25 ENCOUNTER — Encounter: Payer: Self-pay | Admitting: Oncology

## 2012-09-25 ENCOUNTER — Telehealth: Payer: Self-pay | Admitting: Oncology

## 2012-09-25 ENCOUNTER — Other Ambulatory Visit: Payer: Self-pay | Admitting: *Deleted

## 2012-09-25 ENCOUNTER — Ambulatory Visit (HOSPITAL_BASED_OUTPATIENT_CLINIC_OR_DEPARTMENT_OTHER): Payer: Medicare Other | Admitting: Oncology

## 2012-09-25 VITALS — BP 122/78 | HR 57 | Temp 98.1°F | Resp 20 | Ht 66.0 in

## 2012-09-25 DIAGNOSIS — C8404 Mycosis fungoides, lymph nodes of axilla and upper limb: Secondary | ICD-10-CM

## 2012-09-25 DIAGNOSIS — E039 Hypothyroidism, unspecified: Secondary | ICD-10-CM

## 2012-09-25 DIAGNOSIS — E785 Hyperlipidemia, unspecified: Secondary | ICD-10-CM

## 2012-09-25 DIAGNOSIS — S81801A Unspecified open wound, right lower leg, initial encounter: Secondary | ICD-10-CM

## 2012-09-25 DIAGNOSIS — L408 Other psoriasis: Secondary | ICD-10-CM

## 2012-09-25 HISTORY — DX: Unspecified open wound, right lower leg, initial encounter: S81.801A

## 2012-09-25 LAB — COMPREHENSIVE METABOLIC PANEL (CC13)
ALT: 13 U/L (ref 0–55)
AST: 14 U/L (ref 5–34)
Albumin: 2.9 g/dL — ABNORMAL LOW (ref 3.5–5.0)
Alkaline Phosphatase: 64 U/L (ref 40–150)
Glucose: 145 mg/dl — ABNORMAL HIGH (ref 70–99)
Potassium: 4.2 mEq/L (ref 3.5–5.1)
Sodium: 136 mEq/L (ref 136–145)
Total Protein: 6.8 g/dL (ref 6.4–8.3)

## 2012-09-25 LAB — CBC WITH DIFFERENTIAL/PLATELET
Basophils Absolute: 0.1 10*3/uL (ref 0.0–0.1)
EOS%: 0.8 % (ref 0.0–7.0)
HGB: 10.1 g/dL — ABNORMAL LOW (ref 11.6–15.9)
MCH: 28 pg (ref 25.1–34.0)
NEUT#: 11.1 10*3/uL — ABNORMAL HIGH (ref 1.5–6.5)
RDW: 13.8 % (ref 11.2–14.5)
lymph#: 9.5 10*3/uL — ABNORMAL HIGH (ref 0.9–3.3)

## 2012-09-25 LAB — LACTATE DEHYDROGENASE (CC13): LDH: 78 U/L — ABNORMAL LOW (ref 125–245)

## 2012-09-25 NOTE — Progress Notes (Signed)
Hematology and Oncology Follow Up Visit  Sara [name] [id number] 12-21-1918 79 y.o. 09/25/2012 4:02 PM   Principle Diagnosis: Encounter Diagnoses  Name Primary?  . 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 Wound of right leg   . Hypothyroid   . 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 She was initially referred here for a bidirectional leukocytosis with absolute lymphocytosis. Her exam remarkable for a diffuse dermatitis with prominent desquamation of the skin of her palms and soles. She had a long-standing history of psoriasis and was taking methotrexate at time of her initial visit here in July of 2013. Lymphocytes appeared atypical on review of the peripheral blood film. Flow cytometry analysis was obtained and suggested a T-cell lymphoproliferative disorder. On physical exam there was a single 3 cm right axillary lymph node but no splenomegaly or additional areas of adenopathy. I was originally going to start her on Targretin but arranged a consultation with a regional expert at [facility]-Dr. Raynelle Fanning. Additional studies were done along with repeat flow cytometry and confirmed the diagnosis of cutaneous T-cell lymphoma/mycosis fungoides. Recommendation was a trial of UVB light treatment with concomitant low-dose interferon injections 3 times weekly. We were all set to initiate this treatment when she had a setback. She had a nonhealing wound in the right malleolar area of her foot. Despite meticulous attention by her orthopedic surgeon, this area became much worse with significant breakdown of skin. The daughter showed me a picture. There is a ulcerated lesion over about 6-8 cm with a necrotic eschar in the center. She is still undergoing daily wound care. Because of the pain related to this  lesion, she has become immobile. She had to be placed in the full skilled nursing section of [facility] previously in a independent living situation. She never started the interferon since she was told that this would interfere with wound healing. She never started the UV light treatment since her dermatologist of many years and is no longer providing this service and she would need to be referred to another office which became a logistical problem when she became immobile.  Other than the problems with her foot, her general situation has improved. Her methotrexate was stopped back in July. Her skin appears no worse. She has not been running any fevers. Her hematologic profile remains stable compared with the previous labs done for this office over the last 6 months.   Medications: reviewed  Allergies:  Allergies  Allergen Reactions  . Penicillins Other (See Comments)    unknown  . Triamterene Other (See Comments)    unknown  . Alendronate Sodium Rash    Review of Systems: Constitutional:   Generalized weakness Respiratory: No cough or dyspnea Cardiovascular:  No chest pain or palpitations Gastrointestinal: No change in bowel habit. She uses when necessary laxatives. Genito-Urinary: No urinary tract symptoms Musculoskeletal: No myalgias or arthralgias Neurologic: No headache or change in vision Skin: See above Remaining ROS negative.  Physical Exam: Blood pressure 122/78, pulse 57, temperature 98.1 F (36.7 C), temperature source Oral, resp. rate 20, height 5\' 6"  (1.676 m). Wt Readings from Last 3 Encounters:  07/25/12 139 lb 4.8 oz (63.186 kg)  07/23/12 137 lb 6.4 oz (62.324 kg)  07/11/12 137 lb 3.2 oz (62.234 kg)  General appearance: Thin Caucasian woman HENNT: Pharynx no erythema, exudate, or ulcer Lymph nodes: Persistent 3 cm lateral lymph node palpable right axilla Breasts: Lungs: Clear to auscultation resonant to percussion Heart: Regular  rhythm no murmur Abdomen: Soft, nontender, no mass, no organomegaly Extremities: Surgical dressings wrapped around her right lower extremity which I did not remove Vascular: No cyanosis Neurologic: Motor strength is 5 over 5 Skin: Large area of ecchymosis on the left lower extremity above and below the knee. Desquamation of the skin of her legs, and hands. No involvement of the face  Lab Results: Lab Results  Component Value Date   WBC 21.6* 09/25/2012   HGB 10.1* 09/25/2012   HCT 31.5* 09/25/2012   MCV 87.2 09/25/2012   PLT 407* 09/25/2012     Chemistry      Component Value Date/Time   NA 136 09/25/2012 1002   NA 143 07/23/2012 0645   K 4.2 09/25/2012 1002   K 3.6 07/23/2012 0645   CL 102 09/25/2012 1002   CL 108 07/23/2012 0645   CO2 28 09/25/2012 1002   CO2 27 07/23/2012 0645   BUN 17.0 09/25/2012 1002   BUN 20 07/23/2012 0645   CREATININE 0.9 09/25/2012 1002   CREATININE 0.87 07/23/2012 0645      Component Value Date/Time   CALCIUM 9.4 09/25/2012 1002   CALCIUM 9.2 07/23/2012 0645   ALKPHOS 64 09/25/2012 1002   ALKPHOS 52 07/23/2012 0645   AST 14 09/25/2012 1002   AST 18 07/23/2012 0645   ALT 13 09/25/2012 1002   ALT 17 07/23/2012 0645   BILITOT 0.30 09/25/2012 1002   BILITOT 0.2* 07/23/2012 0645      Impression and Plan: #1. Cutaneous T-cell lymphoma/mycosis fungoides In view of the above events, I feel we have to make some compromises. She is reluctant to take the interferon injections. I am going to start her on low-dose Targretin 75 mg daily. I will check with her dermatologist and see who else I can offer the UV light treatments. We are to have baseline thyroid functions, chemistry profile, and lipid profile. I will check weekly CBCs, monthly chemistry, lipid, and thyroid tests. No see her back in 4 weeks to assess her progress.  #2. Long-standing psoriasis delaying diagnosis of mycosis fungoides She seems no worse off the methotrexate.  #3. Nonhealing wound right  lateral malleolus. She continues to get meticulous wound care in conjunction with her orthopedic surgeon.  #4. Chronic atrial fibrillation  #5. Essential hypertension  #6. Coronary artery disease with previous MI; Chronic congestive heart failure  #7. History of a CVA   CC:. Dr. Merri Brunette; Dr. Betsy Coder; Dr. Raynelle Fanning; Dr. Krista Blue, MD 1/13/20144:02 PM

## 2012-09-25 NOTE — Telephone Encounter (Signed)
appts made and printed for pt aom °

## 2012-09-25 NOTE — Patient Instructions (Signed)
Start Targretin at 75 mg by mouth daily Check blood counts weekly Follow up with Dr Reece Agar 2/4 at 10:30 AM

## 2012-09-28 DIAGNOSIS — C8409 Mycosis fungoides, extranodal and solid organ sites: Secondary | ICD-10-CM

## 2012-09-28 DIAGNOSIS — L97909 Non-pressure chronic ulcer of unspecified part of unspecified lower leg with unspecified severity: Secondary | ICD-10-CM

## 2012-09-28 DIAGNOSIS — I6529 Occlusion and stenosis of unspecified carotid artery: Secondary | ICD-10-CM

## 2012-09-28 DIAGNOSIS — I87339 Chronic venous hypertension (idiopathic) with ulcer and inflammation of unspecified lower extremity: Secondary | ICD-10-CM

## 2012-09-28 DIAGNOSIS — K5730 Diverticulosis of large intestine without perforation or abscess without bleeding: Secondary | ICD-10-CM

## 2012-09-28 HISTORY — DX: Occlusion and stenosis of unspecified carotid artery: I65.29

## 2012-09-28 HISTORY — DX: Mycosis fungoides, extranodal and solid organ sites: C84.09

## 2012-09-28 HISTORY — DX: Diverticulosis of large intestine without perforation or abscess without bleeding: K57.30

## 2012-09-28 HISTORY — DX: Chronic venous hypertension (idiopathic) with ulcer and inflammation of unspecified lower extremity: I87.339

## 2012-09-28 HISTORY — DX: Non-pressure chronic ulcer of unspecified part of unspecified lower leg with unspecified severity: L97.909

## 2012-09-29 ENCOUNTER — Other Ambulatory Visit: Payer: Self-pay | Admitting: Dermatology

## 2012-09-29 ENCOUNTER — Telehealth: Payer: Self-pay | Admitting: Oncology

## 2012-09-29 NOTE — Telephone Encounter (Signed)
Faxed pt medical records to [facility] °

## 2012-10-05 ENCOUNTER — Observation Stay (HOSPITAL_COMMUNITY): Payer: Medicare Other

## 2012-10-05 ENCOUNTER — Emergency Department (HOSPITAL_COMMUNITY): Payer: Medicare Other

## 2012-10-05 ENCOUNTER — Encounter (HOSPITAL_COMMUNITY): Payer: Self-pay

## 2012-10-05 ENCOUNTER — Inpatient Hospital Stay (HOSPITAL_COMMUNITY)
Admission: EM | Admit: 2012-10-05 | Discharge: 2012-10-11 | DRG: 239 | Disposition: A | Discharge status: Skilled Nursing Facility | Payer: Medicare Other | Attending: Family Medicine | Admitting: Family Medicine

## 2012-10-05 DIAGNOSIS — D72829 Elevated white blood cell count, unspecified: Secondary | ICD-10-CM

## 2012-10-05 DIAGNOSIS — R079 Chest pain, unspecified: Secondary | ICD-10-CM

## 2012-10-05 DIAGNOSIS — Z66 Do not resuscitate: Secondary | ICD-10-CM | POA: Diagnosis present

## 2012-10-05 DIAGNOSIS — M25571 Pain in right ankle and joints of right foot: Secondary | ICD-10-CM

## 2012-10-05 DIAGNOSIS — E785 Hyperlipidemia, unspecified: Secondary | ICD-10-CM | POA: Diagnosis present

## 2012-10-05 DIAGNOSIS — Z7982 Long term (current) use of aspirin: Secondary | ICD-10-CM

## 2012-10-05 DIAGNOSIS — L408 Other psoriasis: Secondary | ICD-10-CM | POA: Diagnosis present

## 2012-10-05 DIAGNOSIS — A419 Sepsis, unspecified organism: Secondary | ICD-10-CM

## 2012-10-05 DIAGNOSIS — Z79899 Other long term (current) drug therapy: Secondary | ICD-10-CM

## 2012-10-05 DIAGNOSIS — I96 Gangrene, not elsewhere classified: Secondary | ICD-10-CM | POA: Diagnosis present

## 2012-10-05 DIAGNOSIS — D638 Anemia in other chronic diseases classified elsewhere: Secondary | ICD-10-CM | POA: Diagnosis present

## 2012-10-05 DIAGNOSIS — R55 Syncope and collapse: Secondary | ICD-10-CM

## 2012-10-05 DIAGNOSIS — C8404 Mycosis fungoides, lymph nodes of axilla and upper limb: Secondary | ICD-10-CM

## 2012-10-05 DIAGNOSIS — I4891 Unspecified atrial fibrillation: Secondary | ICD-10-CM

## 2012-10-05 DIAGNOSIS — L02419 Cutaneous abscess of limb, unspecified: Secondary | ICD-10-CM | POA: Diagnosis present

## 2012-10-05 DIAGNOSIS — I2510 Atherosclerotic heart disease of native coronary artery without angina pectoris: Secondary | ICD-10-CM | POA: Diagnosis present

## 2012-10-05 DIAGNOSIS — S81801A Unspecified open wound, right lower leg, initial encounter: Secondary | ICD-10-CM | POA: Diagnosis present

## 2012-10-05 DIAGNOSIS — I6789 Other cerebrovascular disease: Secondary | ICD-10-CM

## 2012-10-05 DIAGNOSIS — L8993 Pressure ulcer of unspecified site, stage 3: Secondary | ICD-10-CM | POA: Diagnosis present

## 2012-10-05 DIAGNOSIS — D649 Anemia, unspecified: Secondary | ICD-10-CM | POA: Diagnosis present

## 2012-10-05 DIAGNOSIS — G459 Transient cerebral ischemic attack, unspecified: Secondary | ICD-10-CM

## 2012-10-05 DIAGNOSIS — Z880 Allergy status to penicillin: Secondary | ICD-10-CM

## 2012-10-05 DIAGNOSIS — L89509 Pressure ulcer of unspecified ankle, unspecified stage: Secondary | ICD-10-CM

## 2012-10-05 DIAGNOSIS — R0602 Shortness of breath: Secondary | ICD-10-CM

## 2012-10-05 DIAGNOSIS — J189 Pneumonia, unspecified organism: Secondary | ICD-10-CM

## 2012-10-05 DIAGNOSIS — M79651 Pain in right thigh: Secondary | ICD-10-CM

## 2012-10-05 DIAGNOSIS — Z888 Allergy status to other drugs, medicaments and biological substances status: Secondary | ICD-10-CM

## 2012-10-05 DIAGNOSIS — L0390 Cellulitis, unspecified: Secondary | ICD-10-CM

## 2012-10-05 DIAGNOSIS — I959 Hypotension, unspecified: Secondary | ICD-10-CM

## 2012-10-05 DIAGNOSIS — I509 Heart failure, unspecified: Secondary | ICD-10-CM | POA: Diagnosis present

## 2012-10-05 DIAGNOSIS — I639 Cerebral infarction, unspecified: Secondary | ICD-10-CM

## 2012-10-05 DIAGNOSIS — J9600 Acute respiratory failure, unspecified whether with hypoxia or hypercapnia: Secondary | ICD-10-CM

## 2012-10-05 DIAGNOSIS — Z8673 Personal history of transient ischemic attack (TIA), and cerebral infarction without residual deficits: Secondary | ICD-10-CM

## 2012-10-05 DIAGNOSIS — I5033 Acute on chronic diastolic (congestive) heart failure: Secondary | ICD-10-CM

## 2012-10-05 DIAGNOSIS — E860 Dehydration: Secondary | ICD-10-CM

## 2012-10-05 DIAGNOSIS — I10 Essential (primary) hypertension: Secondary | ICD-10-CM

## 2012-10-05 DIAGNOSIS — I480 Paroxysmal atrial fibrillation: Secondary | ICD-10-CM

## 2012-10-05 DIAGNOSIS — M869 Osteomyelitis, unspecified: Secondary | ICD-10-CM | POA: Diagnosis present

## 2012-10-05 DIAGNOSIS — C8409 Mycosis fungoides, extranodal and solid organ sites: Secondary | ICD-10-CM | POA: Diagnosis present

## 2012-10-05 DIAGNOSIS — I5032 Chronic diastolic (congestive) heart failure: Secondary | ICD-10-CM

## 2012-10-05 DIAGNOSIS — I739 Peripheral vascular disease, unspecified: Secondary | ICD-10-CM

## 2012-10-05 DIAGNOSIS — Z7902 Long term (current) use of antithrombotics/antiplatelets: Secondary | ICD-10-CM

## 2012-10-05 LAB — URINALYSIS, ROUTINE W REFLEX MICROSCOPIC
Glucose, UA: NEGATIVE mg/dL
Ketones, ur: NEGATIVE mg/dL
Nitrite: NEGATIVE
Protein, ur: NEGATIVE mg/dL
Urobilinogen, UA: 0.2 mg/dL (ref 0.0–1.0)

## 2012-10-05 LAB — URINE MICROSCOPIC-ADD ON

## 2012-10-05 LAB — COMPREHENSIVE METABOLIC PANEL
ALT: 25 U/L (ref 0–35)
Calcium: 9.2 mg/dL (ref 8.4–10.5)
Creatinine, Ser: 1 mg/dL (ref 0.50–1.10)
GFR calc Af Amer: 55 mL/min — ABNORMAL LOW (ref >90)
GFR calc non Af Amer: 48 mL/min — ABNORMAL LOW (ref >90)
Glucose, Bld: 111 mg/dL — ABNORMAL HIGH (ref 70–99)
Sodium: 137 mEq/L (ref 135–145)
Total Protein: 6.5 g/dL (ref 6.0–8.3)

## 2012-10-05 LAB — PROCALCITONIN: Procalcitonin: <0.1 ng/mL

## 2012-10-05 LAB — TROPONIN I: Troponin I: <0.3 ng/mL (ref <0.30)

## 2012-10-05 LAB — CBC WITH DIFFERENTIAL/PLATELET
Basophils Relative: 0 % (ref 0–1)
Eosinophils Absolute: 0.3 10*3/uL (ref 0.0–0.7)
Lymphs Abs: 13.7 10*3/uL — ABNORMAL HIGH (ref 0.7–4.0)
MCH: 27.9 pg (ref 26.0–34.0)
MCHC: 32.5 g/dL (ref 30.0–36.0)
MCV: 85.9 fL (ref 78.0–100.0)
Monocytes Absolute: 1.3 10*3/uL — ABNORMAL HIGH (ref 0.1–1.0)
Neutrophils Relative %: 39 % — ABNORMAL LOW (ref 43–77)
Platelets: 359 10*3/uL (ref 150–400)

## 2012-10-05 LAB — PRO B NATRIURETIC PEPTIDE: Pro B Natriuretic peptide (BNP): 3200 pg/mL — ABNORMAL HIGH (ref 0–450)

## 2012-10-05 MED ORDER — BEXAROTENE 75 MG PO CAPS
75.0000 mg | ORAL_CAPSULE | Freq: Every day | ORAL | Status: DC
  Administered 2012-10-06 – 2012-10-09 (×4): 75 mg via ORAL
  Filled 2012-10-05 (×2): qty 1

## 2012-10-05 MED ORDER — SODIUM CHLORIDE 0.9 % IV BOLUS (SEPSIS)
500.0000 mL | Freq: Once | INTRAVENOUS | Status: AC
  Administered 2012-10-05: 500 mL via INTRAVENOUS

## 2012-10-05 MED ORDER — ONDANSETRON HCL 4 MG/2ML IJ SOLN
4.0000 mg | Freq: Four times a day (QID) | INTRAMUSCULAR | Status: DC | PRN
  Filled 2012-10-05: qty 2

## 2012-10-05 MED ORDER — GADOBENATE DIMEGLUMINE 529 MG/ML IV SOLN
12.0000 mL | Freq: Once | INTRAVENOUS | Status: AC
  Administered 2012-10-05: 12 mL via INTRAVENOUS

## 2012-10-05 MED ORDER — VANCOMYCIN HCL IN DEXTROSE 1-5 GM/200ML-% IV SOLN
1000.0000 mg | Freq: Once | INTRAVENOUS | Status: AC
  Administered 2012-10-05: 1000 mg via INTRAVENOUS
  Filled 2012-10-05: qty 200

## 2012-10-05 MED ORDER — AZTREONAM 1 G IJ SOLR
1.0000 g | Freq: Once | INTRAMUSCULAR | Status: DC
  Filled 2012-10-05: qty 1

## 2012-10-05 MED ORDER — SODIUM CHLORIDE 0.9 % IV SOLN
INTRAVENOUS | Status: AC
  Administered 2012-10-05: 21:00:00 via INTRAVENOUS

## 2012-10-05 MED ORDER — DOCUSATE SODIUM 100 MG PO CAPS
100.0000 mg | ORAL_CAPSULE | Freq: Every day | ORAL | Status: DC
  Filled 2012-10-05: qty 1

## 2012-10-05 MED ORDER — ENOXAPARIN SODIUM 40 MG/0.4ML ~~LOC~~ SOLN
40.0000 mg | SUBCUTANEOUS | Status: DC
  Administered 2012-10-05 – 2012-10-08 (×4): 40 mg via SUBCUTANEOUS
  Filled 2012-10-05 (×7): qty 0.4

## 2012-10-05 MED ORDER — BENEPROTEIN PO POWD
2.0000 | Freq: Two times a day (BID) | ORAL | Status: DC
  Administered 2012-10-06 – 2012-10-09 (×7): 12 g via ORAL
  Filled 2012-10-05: qty 227

## 2012-10-05 MED ORDER — ONDANSETRON HCL 4 MG PO TABS: 4.0000 mg | ORAL_TABLET | Freq: Four times a day (QID) | ORAL | Status: DC | PRN

## 2012-10-05 MED ORDER — CARVEDILOL 12.5 MG PO TABS
12.5000 mg | ORAL_TABLET | Freq: Two times a day (BID) | ORAL | Status: DC
  Administered 2012-10-06 – 2012-10-11 (×10): 12.5 mg via ORAL
  Filled 2012-10-05 (×15): qty 1

## 2012-10-05 MED ORDER — FERROUS SULFATE 325 (65 FE) MG PO TABS
325.0000 mg | ORAL_TABLET | Freq: Two times a day (BID) | ORAL | Status: DC
  Administered 2012-10-06 – 2012-10-11 (×11): 325 mg via ORAL
  Filled 2012-10-05 (×14): qty 1

## 2012-10-05 MED ORDER — OXYCODONE HCL 5 MG PO TABS
5.0000 mg | ORAL_TABLET | ORAL | Status: DC | PRN
  Administered 2012-10-05: 5 mg via ORAL
  Filled 2012-10-05: qty 1

## 2012-10-05 MED ORDER — DILTIAZEM HCL ER COATED BEADS 120 MG PO CP24
120.0000 mg | ORAL_CAPSULE | Freq: Every day | ORAL | Status: DC
  Administered 2012-10-06 – 2012-10-11 (×6): 120 mg via ORAL
  Filled 2012-10-05 (×6): qty 1

## 2012-10-05 MED ORDER — PANTOPRAZOLE SODIUM 40 MG PO TBEC
40.0000 mg | DELAYED_RELEASE_TABLET | Freq: Every day | ORAL | Status: DC
  Administered 2012-10-06 – 2012-10-11 (×6): 40 mg via ORAL
  Filled 2012-10-05 (×5): qty 1

## 2012-10-05 MED ORDER — HYDROCORTISONE 1 % EX CREA
1.0000 "application " | TOPICAL_CREAM | Freq: Two times a day (BID) | CUTANEOUS | Status: DC
  Administered 2012-10-05 – 2012-10-10 (×11): 1 via TOPICAL
  Filled 2012-10-05: qty 28

## 2012-10-05 MED ORDER — FUROSEMIDE 40 MG PO TABS
40.0000 mg | ORAL_TABLET | Freq: Every morning | ORAL | Status: DC
  Administered 2012-10-06 – 2012-10-11 (×6): 40 mg via ORAL
  Filled 2012-10-05 (×6): qty 1

## 2012-10-05 MED ORDER — FOLIC ACID 1 MG PO TABS
1.0000 mg | ORAL_TABLET | Freq: Every day | ORAL | Status: DC
  Administered 2012-10-06 – 2012-10-11 (×6): 1 mg via ORAL
  Filled 2012-10-05 (×7): qty 1

## 2012-10-05 MED ORDER — DEXTROSE 5 % IV SOLN
1.0000 g | Freq: Once | INTRAVENOUS | Status: AC
  Administered 2012-10-05: 1 g via INTRAVENOUS
  Filled 2012-10-05: qty 1

## 2012-10-05 MED ORDER — DEXTROSE 5 % IV SOLN
1.0000 g | Freq: Three times a day (TID) | INTRAVENOUS | Status: DC
  Administered 2012-10-05 – 2012-10-11 (×17): 1 g via INTRAVENOUS
  Filled 2012-10-05 (×18): qty 1

## 2012-10-05 MED ORDER — HYDROMORPHONE HCL PF 1 MG/ML IJ SOLN
1.0000 mg | INTRAMUSCULAR | Status: DC | PRN
  Administered 2012-10-05 – 2012-10-08 (×7): 1 mg via INTRAVENOUS
  Filled 2012-10-05 (×7): qty 1

## 2012-10-05 MED ORDER — HYDROXYZINE HCL 10 MG PO TABS
10.0000 mg | ORAL_TABLET | Freq: Every day | ORAL | Status: DC
  Administered 2012-10-05 – 2012-10-10 (×6): 10 mg via ORAL
  Filled 2012-10-05 (×7): qty 1

## 2012-10-05 MED ORDER — ACETAMINOPHEN 325 MG PO TABS
650.0000 mg | ORAL_TABLET | ORAL | Status: DC | PRN
  Administered 2012-10-06: 650 mg via ORAL
  Filled 2012-10-05 (×2): qty 2

## 2012-10-05 MED ORDER — ASPIRIN 81 MG PO CHEW
81.0000 mg | CHEWABLE_TABLET | Freq: Every day | ORAL | Status: DC
  Administered 2012-10-06 – 2012-10-11 (×5): 81 mg via ORAL
  Filled 2012-10-05 (×6): qty 1

## 2012-10-05 MED ORDER — NITROGLYCERIN 0.4 MG SL SUBL: 0.4000 mg | SUBLINGUAL_TABLET | SUBLINGUAL | Status: DC | PRN

## 2012-10-05 MED ORDER — POLYETHYLENE GLYCOL 3350 17 G PO PACK
17.0000 g | PACK | Freq: Every day | ORAL | Status: DC
  Administered 2012-10-05 – 2012-10-11 (×5): 17 g via ORAL
  Filled 2012-10-05 (×7): qty 1

## 2012-10-05 MED ORDER — MORPHINE SULFATE 4 MG/ML IJ SOLN
4.0000 mg | Freq: Once | INTRAMUSCULAR | Status: AC
  Administered 2012-10-05: 4 mg via INTRAVENOUS
  Filled 2012-10-05: qty 1

## 2012-10-05 MED ORDER — CLOPIDOGREL BISULFATE 75 MG PO TABS
75.0000 mg | ORAL_TABLET | Freq: Every day | ORAL | Status: DC
  Administered 2012-10-06 – 2012-10-11 (×5): 75 mg via ORAL
  Filled 2012-10-05 (×6): qty 1

## 2012-10-05 MED ORDER — VANCOMYCIN HCL 1000 MG IV SOLR
750.0000 mg | INTRAVENOUS | Status: DC
  Administered 2012-10-06 – 2012-10-09 (×4): 750 mg via INTRAVENOUS
  Filled 2012-10-05 (×6): qty 750

## 2012-10-05 NOTE — Consult Note (Signed)
Reason for Consult: loss of consciousness  Referring Physician: Dr Silverio Lay  CC: daughter witnessed LOC  HPI: Sara [name] is an 79 y.o. female who lives at [facility], awoke this am and felt more tired than usual. Had assistance getting to dining hall. Daughter met her there and noted that her mom was groggy and falling asleep at the table. She brought her back to the room and tried to feed her ice cream but she started drooling and then was unresponsive. She called the nurses who helped her to put her back to bed. Within 3 minutes she believes her mother was awake and beginning to communicate normally after laying down. She did not note any seizure like activity. The patient does not remember coming back to the room.  She has a history of CVA with residual left UE weakness. She does have RLE wounds that Dr. Lajoyce Corners treats. These are around her heal. Yesterday am she noted that her right leg started having pain into hip area and now she cannot move it. It is now externally rotated. No history of fall.  Past Medical History  Diagnosis Date  . Hypertension   . Atrial fibrillation   . Psoriasis   . Congestive heart failure (CHF)   . History of CVA (cerebrovascular accident)   . Coronary artery disease   . Myocardial infarction   . CHF (congestive heart failure)   . Stroke   . Shortness of breath   . H/O hiatal hernia   . Closed fracture of head of left humerus 5/12  . Basal cell carcinoma   . 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  Flow cytometry 04/04/12: 91% cells CD4 positive CD 26 negative  . Hyperlipemia   . Pneumonia   . Wound of right leg 09/25/2012    Necrotic, open wound right lower leg; non healing    Past Surgical History  Procedure Date  . Tonsillectomy   . Dilation and curettage of uterus   . Eye surgery   . Cataracts   .  Vertebroplasty     Family History  Problem Relation Age of Onset  . Stomach cancer Mother   . Pneumonia Father     Social History:  reports that she has never smoked. She has never used smokeless tobacco. She reports that she does not drink alcohol or use illicit drugs.  Allergies  Allergen Reactions  . Penicillins Other (See Comments)    unknown  . Triamterene Other (See Comments)    unknown  . Alendronate Sodium Rash    No current facility-administered medications for this encounter.   Current Outpatient Prescriptions  Medication Sig Dispense Refill  . acetaminophen (TYLENOL) 500 MG tablet Take 500 mg by mouth every 6 (six) hours as needed. For pain      . aspirin 81 MG chewable tablet Chew 81 mg by mouth daily.      . bexarotene (TARGRETIN) 75 MG CAPS capsule Take 75 mg by mouth daily before supper. Give with food. Protect from light. CAUTION: Chemotherapy/Biotherapy      . carvedilol (COREG) 12.5 MG tablet Take 12.5 mg by mouth 2 (two) times daily with a meal.      . clopidogrel (PLAVIX) 75 MG tablet Take 75 mg by mouth daily.      . Coenzyme Q10 (CO Q 10) 100 MG CAPS Take 1  capsule by mouth daily.      Marland Kitchen diltiazem (CARDIZEM CD) 120 MG 24 hr capsule Take 120 mg by mouth daily.      Marland Kitchen docusate sodium (COLACE) 100 MG capsule Take 100 mg by mouth daily. For constipation      . ferrous sulfate 325 (65 FE) MG tablet Take 325 mg by mouth 2 (two) times daily.      . folic acid (FOLVITE) 1 MG tablet Take 1 mg by mouth daily.      . furosemide (LASIX) 40 MG tablet Take 40 mg by mouth every morning.      . hydrocortisone cream 1 % Apply 1 application topically 2 (two) times daily. To flaking skin and rash      . hydrOXYzine (ATARAX/VISTARIL) 10 MG tablet Take 10 mg by mouth at bedtime.       Marland Kitchen lisinopril (PRINIVIL,ZESTRIL) 40 MG tablet Take 1 tablet (40 mg total) by mouth daily.      . nitroGLYCERIN (NITROSTAT) 0.4 MG SL tablet Place 0.4 mg under the tongue every 5 (five) minutes as  needed. For chest pain      . Omega-3 Fatty Acids (FISH OIL) 1000 MG CAPS Take 1,000 mg by mouth at bedtime.       Marland Kitchen oxyCODONE (OXY IR/ROXICODONE) 5 MG immediate release tablet Take 5-10 mg by mouth every 4 (four) hours as needed. Take 1 tablet for moderate pain and 2 tablets for severe pain      . pantoprazole (PROTONIX) 40 MG tablet Take 40 mg by mouth daily.      . polyethylene glycol (MIRALAX / GLYCOLAX) packet Take 17 g by mouth daily.      . protein supplement (RESOURCE BENEPROTEIN) POWD Take 2 scoop by mouth 2 (two) times daily. In beverage      . spironolactone (ALDACTONE) 25 MG tablet Take 12.5 mg by mouth daily.      . vitamin C (ASCORBIC ACID) 500 MG tablet Take 500 mg by mouth at bedtime.          ROS: History obtained from child, chart review and the patient  General ROS: negative for - chills, fatigue, fever, night sweats, weight gain or weight loss Psychological ROS: negative for - behavioral disorder, hallucinations, memory difficulties, mood swings or suicidal ideation Ophthalmic ROS: negative for - blurry vision, double vision, eye pain or loss of vision ENT ROS: negative for - epistaxis, nasal discharge, oral lesions, sore throat, tinnitus or vertigo Allergy and Immunology ROS: negative for - hives or itchy/watery eyes Hematological and Lymphatic ROS: negative for - bleeding problems, bruising or swollen lymph nodes Endocrine ROS: negative for - galactorrhea, hair pattern changes, polydipsia/polyuria or temperature intolerance Respiratory ROS: negative for - cough, hemoptysis, shortness of breath or wheezing Cardiovascular ROS: negative for - chest pain, dyspnea on exertion, edema or irregular heartbeat Gastrointestinal ROS: negative for - abdominal pain, diarrhea, hematemesis, nausea/vomiting or stool incontinence Genito-Urinary ROS: negative for - dysuria, hematuria, incontinence or urinary frequency/urgency Musculoskeletal ROS: negative for - joint swelling or muscular  weakness Neurological ROS: as noted in HPI Dermatological ROS: negative for rash, +skin lesion changes RLE open distal wounds, non healing, edematous   Physical Examination: Blood pressure 97/52, pulse 65, temperature 98.2 F (36.8 C), temperature source Oral, resp. rate 15, SpO2 97.00%.  Neurologic Examination Mental Status: Alert, oriented, thought content appropriate.  Speech fluent without evidence of aphasia.  Able to follow 3 step commands without difficulty. Cranial Nerves: II: visual fields grossly normal,  pupils unequal cataract/laser,  reactive to light III,IV, VI: ptosis not present, extra-ocular motions intact bilaterally V,VII: smile symmetric, facial light touch sensation normal bilaterally VIII: hearing normal bilaterally IX,X: gag reflex present XI: trapezius strength/neck flexion strength normal bilaterally XII: tongue strength normal  Motor: Right : Upper extremity   5/5      Lower extremity   -/5 difficult to access due to pain and immobility per patient, able to move toes, dorsi-plantar flex. Left:     Upper extremity   4+/5                                      Lower extremity   5/5 Tone and bulk:normal tone throughout; no atrophy noted Sensory: Pinprick and light touch intact throughout, bilaterally Plantars: Right: downgoing   Left: downgoing Cerebellar: normal finger-to-nose, gait not tested as patient unable to utilize right leg due to pain.    Results for orders placed during the hospital encounter of 10/05/12 (from the past 48 hour(s))  GLUCOSE, CAPILLARY     Status: Abnormal   Collection Time   10/05/12  2:28 PM      Component Value Range Comment   Glucose-Capillary 136 (*) 70 - 99 mg/dL   CBC WITH DIFFERENTIAL     Status: Abnormal   Collection Time   10/05/12  3:44 PM      Component Value Range Comment   WBC 25.1 (*) 4.0 - 10.5 K/uL    RBC 3.55 (*) 3.87 - 5.11 MIL/uL    Hemoglobin 9.9 (*) 12.0 - 15.0 g/dL    HCT 16.1 (*) 09.6 - 46.0 %    MCV  85.9  78.0 - 100.0 fL    MCH 27.9  26.0 - 34.0 pg    MCHC 32.5  30.0 - 36.0 g/dL    RDW 04.5  40.9 - 81.1 %    Platelets 359  150 - 400 K/uL    Neutrophils Relative 39 (*) 43 - 77 %    Lymphocytes Relative 55 (*) 12 - 46 %    Monocytes Relative 5  3 - 12 %    Eosinophils Relative 1  0 - 5 %    Basophils Relative 0  0 - 1 %    Neutro Abs 9.8 (*) 1.7 - 7.7 K/uL    Lymphs Abs 13.7 (*) 0.7 - 4.0 K/uL    Monocytes Absolute 1.3 (*) 0.1 - 1.0 K/uL    Eosinophils Absolute 0.3  0.0 - 0.7 K/uL    Basophils Absolute 0.0  0.0 - 0.1 K/uL    WBC Morphology ABSOLUTE LYMPHOCYTOSIS     COMPREHENSIVE METABOLIC PANEL     Status: Abnormal   Collection Time   10/05/12  3:44 PM      Component Value Range Comment   Sodium 137  135 - 145 mEq/L    Potassium 4.4  3.5 - 5.1 mEq/L    Chloride 99  96 - 112 mEq/L    CO2 25  19 - 32 mEq/L    Glucose, Bld 111 (*) 70 - 99 mg/dL    BUN 31 (*) 6 - 23 mg/dL    Creatinine, Ser 9.14  0.50 - 1.10 mg/dL    Calcium 9.2  8.4 - 78.2 mg/dL    Total Protein 6.5  6.0 - 8.3 g/dL    Albumin 2.7 (*) 3.5 - 5.2 g/dL    AST  23  0 - 37 U/L    ALT 25  0 - 35 U/L    Alkaline Phosphatase 57  39 - 117 U/L    Total Bilirubin 0.1 (*) 0.3 - 1.2 mg/dL    GFR calc non Af Amer 48 (*) >90 mL/min    GFR calc Af Amer 55 (*) >90 mL/min   LACTIC ACID, PLASMA     Status: Normal   Collection Time   10/05/12  3:44 PM      Component Value Range Comment   Lactic Acid, Venous 1.6  0.5 - 2.2 mmol/L   PROCALCITONIN     Status: Normal   Collection Time   10/05/12  3:44 PM      Component Value Range Comment   Procalcitonin <0.10     TROPONIN I     Status: Normal   Collection Time   10/05/12  3:45 PM      Component Value Range Comment   Troponin I <0.30  <0.30 ng/mL   PRO B NATRIURETIC PEPTIDE     Status: Abnormal   Collection Time   10/05/12  3:45 PM      Component Value Range Comment   Pro B Natriuretic peptide (BNP) 3200.0 (*) 0 - 450 pg/mL     No results found for this or any previous  visit (from the past 240 hour(s)).  Dg Chest 1 View 10/05/2012  *RADIOLOGY REPORT*  Clinical Data: Leg ulceration.  CHEST - 1 VIEW  Comparison: PA and lateral chest 08/01/2012 and CT chest 06/02/2012.  Findings: Lungs are clear.  Heart size normal.  No pneumothorax or pleural fluid.  Remote fracture surgical neck left humerus is identified.  The patient is status post lower thoracic vertebroplasty.  IMPRESSION: No acute finding.   Original Report Authenticated By: Holley Dexter, M.D.    Dg Tibia/fibula Right 10/05/2012  *RADIOLOGY REPORT*  Clinical Data: Large ulceration over the Achilles tendon.  RIGHT TIBIA AND FIBULA - 2 VIEW  Comparison: Plain films right ankle 12/07/2011 and plain films right knee 01/14/2011.  Findings: Skin ulceration over the Achilles tendon is identified. No underlying soft tissue gas collection, radiopaque foreign body or bony destructive change is noted.  Bones are osteopenic.  There is no fracture.  Degenerative disease of the midfoot is noted.  IMPRESSION: Large skin ulceration over the Achilles without plain film evidence of osteomyelitis.   Original Report Authenticated By: Holley Dexter, M.D.    Dg Ankle Complete Right 10/05/2012  *RADIOLOGY REPORT*  Clinical Data: Large skin ulceration over the Achilles.  RIGHT ANKLE - COMPLETE 3+ VIEW  Comparison: MRI of the right ankle 01/09/2012 and plain films of the right foot 07/23/2012.  Findings: Large skin ulceration over the Achilles is identified. No underlying radiopaque foreign body, soft tissue gas collection or bony destructive change is noted.  Bones appear osteopenic. Midfoot degenerative disease of the right foot is noted.  IMPRESSION: Large skin ulceration without plain film evidence of osteomyelitis.   Original Report Authenticated By: Holley Dexter, M.D.    Ct Head Wo Contrast 10/05/2012  *RADIOLOGY REPORT*  Clinical Data: Episode of unresponsiveness for 2-3 minutes, history hypertension, coronary artery disease  post MI, CHF  CT HEAD WITHOUT CONTRAST  Technique:  Contiguous axial images were obtained from the base of the skull through the vertex without contrast.  Comparison: 12/07/2011  Findings: Generalized atrophy. Normal ventricular morphology. No midline shift or mass effect. Small vessel chronic ischemic changes of deep cerebral white matter. No intracranial  hemorrhage or mass lesion. area of low attenuation identified at left middle cerebral peduncle could be related to white matter disease though a developing infarct is not excluded. Tiny old right thalamic and right basal ganglia lacunar infarcts. Scattered streak artifacts from skull base at brain stem and cerebellum. Atherosclerotic calcifications of internal carotid arteries at skull base. Visualized paranasal sinuses and mastoid air cells clear. No acute osseous findings.  IMPRESSION: Atrophy with extensive small vessel chronic ischemic changes of deep cerebral white matter. Tiny old right thalamic and right basal ganglia lacunar infarcts. Question white matter disease changes at the left middle cerebral peduncle though cannot completely exclude infarct.   Original Report Authenticated By: Ulyses Southward, M.D.     Assessment/Plan:  79yo female with LOC in setting of SBP 70's while at residence. No evidence of seizure activity per daughter who was present. Now with increasing pain of right leg which is now getting wound dressing therapy for non-healing ulcers. Her new inability to stand could be concerning for infarct affecting the right leg, and CT shows possible hypodensity in a region that could correlate to this. Patient on coreg, cardizem, prinivil and lasix.     MRI Head to evaluate for acute ischemic event.  EEG to evaluate for seizure predisposition  Patient on multiple agents to decrease blood pressure  Leukocytosis, concerning with non-healing ulcers, that this is a systemic response.  Will follow  Job Founds, MBA, MHA [facility] Pager [phone]   10/05/2012, 6:00 PM     I have seen and evaluated the patient. I have reviewed the above note and made appropriate changes. 79 yo F with new inability to stand on right leg likely due to worsening infection, also hypotensive today with episode of unresponsiveness. I suspect a hypoperfusion event, however given her history of stroke, seizure and new stroke need evaluation.   Ritta Slot, MD [facility] [phone]  If 7pm- 7am, please page neurology on call at [phone].

## 2012-10-05 NOTE — ED Notes (Signed)
Pt is in MRI  

## 2012-10-05 NOTE — ED Provider Notes (Signed)
History     CSN: [id number]  Arrival date & time 10/05/12  1407   First MD Initiated Contact with Patient 10/05/12 1508      Chief Complaint  Patient presents with  . Loss of Consciousness    (Consider location/radiation/quality/duration/timing/severity/associated sxs/prior treatment) The history is provided by the patient.  RAKEISHA NYCE is a 79 y.o. female history of hypertension, atrial fibrillation, CHF, CVA here with hypotension, and an episode of unresponsiveness. She has a chronic right ankle ulcer that is progressively getting worse. Today while at lunch she became unresponsive for several minutes and was drooling out of the left mouth. This is witnessed by the daughter and the daughter denies that mom had any slurred speech during that time. No fevers or chills. At the nursing home she was hypotensive to 96/53. Denies any cough or urinary symptoms.   Past Medical History  Diagnosis Date  . Hypertension   . Atrial fibrillation   . Psoriasis   . Congestive heart failure (CHF)   . History of CVA (cerebrovascular accident)   . Coronary artery disease   . Myocardial infarction   . CHF (congestive heart failure)   . Stroke   . Shortness of breath   . H/O hiatal hernia   . Closed fracture of head of left humerus 5/12  . Basal cell carcinoma   . 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  Flow cytometry 04/04/12: 91% cells CD4 positive CD 26 negative  . Hyperlipemia   . Pneumonia   . Wound of right leg 09/25/2012    Necrotic, open wound right lower leg; non healing    Past Surgical History  Procedure Date  . Tonsillectomy   . Dilation and curettage of uterus   . Eye surgery   . Cataracts   . Vertebroplasty     Family History  Problem Relation Age of Onset  . Stomach cancer Mother   . Pneumonia Father     History  Substance  Use Topics  . Smoking status: Never Smoker   . Smokeless tobacco: Never Used  . Alcohol Use: No    OB History    Grav Para Term Preterm Abortions TAB SAB Ect Mult Living                  Review of Systems  Cardiovascular: Positive for syncope.  Neurological: Positive for weakness.  All other systems reviewed and are negative.    Allergies  Penicillins; Triamterene; and Alendronate sodium  Home Medications   Current Outpatient Rx  Name  Route  Sig  Dispense  Refill  . ACETAMINOPHEN 500 MG PO TABS   Oral   Take 500 mg by mouth every 6 (six) hours as needed. For pain         . ASPIRIN 81 MG PO CHEW   Oral   Chew 81 mg by mouth daily.         Marland Kitchen BEXAROTENE 75 MG PO CAPS   Oral   Take 75 mg by mouth daily before supper. Give with food. Protect from light. CAUTION: Chemotherapy/Biotherapy         . CARVEDILOL 12.5 MG PO TABS   Oral   Take 12.5 mg by mouth 2 (two) times daily with a meal.         . CLOPIDOGREL BISULFATE 75 MG PO TABS  Oral   Take 75 mg by mouth daily.         . CO Q 10 100 MG PO CAPS   Oral   Take 1 capsule by mouth daily.         Marland Kitchen DILTIAZEM HCL ER COATED BEADS 120 MG PO CP24   Oral   Take 120 mg by mouth daily.         Marland Kitchen DOCUSATE SODIUM 100 MG PO CAPS   Oral   Take 100 mg by mouth daily. For constipation         . FERROUS SULFATE 325 (65 FE) MG PO TABS   Oral   Take 325 mg by mouth 2 (two) times daily.         Marland Kitchen FOLIC ACID 1 MG PO TABS   Oral   Take 1 mg by mouth daily.         . FUROSEMIDE 40 MG PO TABS   Oral   Take 40 mg by mouth every morning.         Marland Kitchen HYDROCORTISONE 1 % EX CREA   Topical   Apply 1 application topically 2 (two) times daily. To flaking skin and rash         . HYDROXYZINE HCL 10 MG PO TABS   Oral   Take 10 mg by mouth at bedtime.          Marland Kitchen LISINOPRIL 40 MG PO TABS   Oral   Take 1 tablet (40 mg total) by mouth daily.         Marland Kitchen NITROGLYCERIN 0.4 MG SL SUBL   Sublingual    Place 0.4 mg under the tongue every 5 (five) minutes as needed. For chest pain         . FISH OIL 1000 MG PO CAPS   Oral   Take 1,000 mg by mouth at bedtime.          . OXYCODONE HCL 5 MG PO TABS   Oral   Take 5-10 mg by mouth every 4 (four) hours as needed. Take 1 tablet for moderate pain and 2 tablets for severe pain         . PANTOPRAZOLE SODIUM 40 MG PO TBEC   Oral   Take 40 mg by mouth daily.         Marland Kitchen POLYETHYLENE GLYCOL 3350 PO PACK   Oral   Take 17 g by mouth daily.         Scherrie November PO POWD   Oral   Take 2 scoop by mouth 2 (two) times daily. In beverage         . SPIRONOLACTONE 25 MG PO TABS   Oral   Take 12.5 mg by mouth daily.         Marland Kitchen VITAMIN C 500 MG PO TABS   Oral   Take 500 mg by mouth at bedtime.            BP 97/52  Pulse 65  Temp 98.2 F (36.8 C) (Oral)  Resp 15  SpO2 97%  Physical Exam  Nursing note and vitals reviewed. Constitutional: She is oriented to person, place, and time.       Chronically ill, bed bound   HENT:  Head: Normocephalic.       MM slightly dry   Eyes: Conjunctivae normal are normal. Pupils are equal, round, and reactive to light.  Neck: Normal range of motion. Neck supple.  Cardiovascular: Normal rate,  regular rhythm and normal heart sounds.   Pulmonary/Chest: Effort normal and breath sounds normal. No respiratory distress. She has no wheezes. She has no rales.  Abdominal: Soft. Bowel sounds are normal. She exhibits no distension. There is no tenderness. There is no rebound.  Musculoskeletal:       R ankle with large and foul smelling stage 3 ulcer around the achilles. + tenderness to touch.   Neurological: She is alert and oriented to person, place, and time.       5/5 strength throughout except R leg (unable to move due to pain).   Skin: Skin is warm and dry.  Psychiatric: She has a normal mood and affect. Her behavior is normal. Judgment and thought content normal.    ED Course  Procedures  (including critical care time)  Labs Reviewed  GLUCOSE, CAPILLARY - Abnormal; Notable for the following:    Glucose-Capillary 136 (*)     All other components within normal limits  CBC WITH DIFFERENTIAL - Abnormal; Notable for the following:    WBC 25.1 (*)     RBC 3.55 (*)     Hemoglobin 9.9 (*)     HCT 30.5 (*)     Neutrophils Relative 39 (*)     Lymphocytes Relative 55 (*)     Neutro Abs 9.8 (*)     Lymphs Abs 13.7 (*)     Monocytes Absolute 1.3 (*)     All other components within normal limits  COMPREHENSIVE METABOLIC PANEL - Abnormal; Notable for the following:    Glucose, Bld 111 (*)     BUN 31 (*)     Albumin 2.7 (*)     Total Bilirubin 0.1 (*)     GFR calc non Af Amer 48 (*)     GFR calc Af Amer 55 (*)     All other components within normal limits  PRO B NATRIURETIC PEPTIDE - Abnormal; Notable for the following:    Pro B Natriuretic peptide (BNP) 3200.0 (*)     All other components within normal limits  LACTIC ACID, PLASMA  PROCALCITONIN  TROPONIN I  CULTURE, BLOOD (ROUTINE X 2)  CULTURE, BLOOD (ROUTINE X 2)  URINALYSIS, ROUTINE W REFLEX MICROSCOPIC  URINE CULTURE   Dg Chest 1 View  10/05/2012  *RADIOLOGY REPORT*  Clinical Data: Leg ulceration.  CHEST - 1 VIEW  Comparison: PA and lateral chest 08/01/2012 and CT chest 06/02/2012.  Findings: Lungs are clear.  Heart size normal.  No pneumothorax or pleural fluid.  Remote fracture surgical neck left humerus is identified.  The patient is status post lower thoracic vertebroplasty.  IMPRESSION: No acute finding.   Original Report Authenticated By: Holley Dexter, M.D.    Dg Tibia/fibula Right  10/05/2012  *RADIOLOGY REPORT*  Clinical Data: Large ulceration over the Achilles tendon.  RIGHT TIBIA AND FIBULA - 2 VIEW  Comparison: Plain films right ankle 12/07/2011 and plain films right knee 01/14/2011.  Findings: Skin ulceration over the Achilles tendon is identified. No underlying soft tissue gas collection, radiopaque  foreign body or bony destructive change is noted.  Bones are osteopenic.  There is no fracture.  Degenerative disease of the midfoot is noted.  IMPRESSION: Large skin ulceration over the Achilles without plain film evidence of osteomyelitis.   Original Report Authenticated By: Holley Dexter, M.D.    Dg Ankle Complete Right  10/05/2012  *RADIOLOGY REPORT*  Clinical Data: Large skin ulceration over the Achilles.  RIGHT ANKLE - COMPLETE 3+ VIEW  Comparison: MRI of the right ankle 01/09/2012 and plain films of the right foot 07/23/2012.  Findings: Large skin ulceration over the Achilles is identified. No underlying radiopaque foreign body, soft tissue gas collection or bony destructive change is noted.  Bones appear osteopenic. Midfoot degenerative disease of the right foot is noted.  IMPRESSION: Large skin ulceration without plain film evidence of osteomyelitis.   Original Report Authenticated By: Holley Dexter, M.D.    Ct Head Wo Contrast  10/05/2012  *RADIOLOGY REPORT*  Clinical Data: Episode of unresponsiveness for 2-3 minutes, history hypertension, coronary artery disease post MI, CHF  CT HEAD WITHOUT CONTRAST  Technique:  Contiguous axial images were obtained from the base of the skull through the vertex without contrast.  Comparison: 12/07/2011  Findings: Generalized atrophy. Normal ventricular morphology. No midline shift or mass effect. Small vessel chronic ischemic changes of deep cerebral white matter. No intracranial hemorrhage or mass lesion. area of low attenuation identified at left middle cerebral peduncle could be related to white matter disease though a developing infarct is not excluded. Tiny old right thalamic and right basal ganglia lacunar infarcts. Scattered streak artifacts from skull base at brain stem and cerebellum. Atherosclerotic calcifications of internal carotid arteries at skull base. Visualized paranasal sinuses and mastoid air cells clear. No acute osseous findings.   IMPRESSION: Atrophy with extensive small vessel chronic ischemic changes of deep cerebral white matter. Tiny old right thalamic and right basal ganglia lacunar infarcts. Question white matter disease changes at the left middle cerebral peduncle though cannot completely exclude infarct.   Original Report Authenticated By: Ulyses Southward, M.D.      No diagnosis found.   Date: 10/05/2012  Rate: 74  Rhythm: normal sinus rhythm  QRS Axis: normal  Intervals: normal  ST/T Wave abnormalities: nonspecific ST changes  Conduction Disutrbances:none  Narrative Interpretation:   Old EKG Reviewed: unchanged     MDM  Arnesha A Giacobbe is a 79 y.o. female here with LOC, hypotension. Likely syncope from hypotension from underlying sepsis. Will also consider TIA. Will get sepsis workup, CT head. Will also need to r/o osteomyelitis of R ankle. Will start empiric abx and give IVF and reassess. Will also get BNP given hx of CHF.    6:16 PM Labs showed WBC 25, BNP at baseline. BP responded with 500 cc NS. CT head showed possible acute stroke. Neuro hospitalist evaluated patient and recommend MRI for TIA workup. Xrays showed no osteo but I started vanc and aztreonam for broad spectrum abx. I called Dr. Waymon Amato who accepted the patient on tele.         Richardean Canal, MD 10/05/12 [phone]

## 2012-10-05 NOTE — ED Notes (Addendum)
Per ems- pt from [facility], per daughter and staff pt was unresponsive for 2-3 min. Initial bp was 72/42. Pt alert oriented with ems, no complaints at this time. No neuro deficits noted. 20g IV left hand. Pt received 500cc en route. HR-78, NSR BP-96/53 (last) O2--99% on RA. Pt has large ulcer on right foot.

## 2012-10-05 NOTE — Progress Notes (Signed)
ANTIBIOTIC CONSULT NOTE - INITIAL  Pharmacy Consult for Vancomycin and aztreonam Indication: RLE wounds, cellulitis  Allergies  Allergen Reactions  . Penicillins Other (See Comments)    unknown  . Triamterene Other (See Comments)    unknown  . Alendronate Sodium Rash    Patient Measurements: Height: 5\' 7"  (170.2 cm) Weight: 143 lb 11.8 oz (65.2 kg) IBW/kg (Calculated) : 61.6   Vital Signs: Temp: 98.5 F (36.9 C) (01/23 2036) Temp src: Oral (01/23 2036) BP: 160/68 mmHg (01/23 2036) Pulse Rate: 80  (01/23 2036) Intake/Output from previous day:   Intake/Output from this shift:    Labs:  Basename 10/05/12 1544  WBC 25.1*  HGB 9.9*  PLT 359  LABCREA --  CREATININE 1.00   Estimated Creatinine Clearance: 35.6 ml/min (by C-G formula based on Cr of 1). No results found for this basename: VANCOTROUGH:2,VANCOPEAK:2,VANCORANDOM:2,GENTTROUGH:2,GENTPEAK:2,GENTRANDOM:2,TOBRATROUGH:2,TOBRAPEAK:2,TOBRARND:2,AMIKACINPEAK:2,AMIKACINTROU:2,AMIKACIN:2, in the last 72 hours   Microbiology: No results found for this or any previous visit (from the past 720 hour(s)).  Medical History: Past Medical History  Diagnosis Date  . Hypertension   . Atrial fibrillation   . Psoriasis   . Congestive heart failure (CHF)   . History of CVA (cerebrovascular accident)   . Coronary artery disease   . Myocardial infarction   . CHF (congestive heart failure)   . Stroke   . Shortness of breath   . H/O hiatal hernia   . Closed fracture of head of left humerus 5/12  . Basal cell carcinoma   . 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  Flow cytometry 04/04/12: 91% cells CD4 positive CD 26 negative  . Hyperlipemia   . Pneumonia   . Wound of right leg 09/25/2012    Necrotic, open wound right lower leg; non healing   Medications:  Prescriptions prior to  admission  Medication Sig Dispense Refill  . acetaminophen (TYLENOL) 500 MG tablet Take 500 mg by mouth every 6 (six) hours as needed. For pain      . aspirin 81 MG chewable tablet Chew 81 mg by mouth daily.      . bexarotene (TARGRETIN) 75 MG CAPS capsule Take 75 mg by mouth daily before supper. Give with food. Protect from light. CAUTION: Chemotherapy/Biotherapy      . carvedilol (COREG) 12.5 MG tablet Take 12.5 mg by mouth 2 (two) times daily with a meal.      . clopidogrel (PLAVIX) 75 MG tablet Take 75 mg by mouth daily.      . Coenzyme Q10 (CO Q 10) 100 MG CAPS Take 1 capsule by mouth daily.      Marland Kitchen diltiazem (CARDIZEM CD) 120 MG 24 hr capsule Take 120 mg by mouth daily.      Marland Kitchen docusate sodium (COLACE) 100 MG capsule Take 100 mg by mouth daily. For constipation      . ferrous sulfate 325 (65 FE) MG tablet Take 325 mg by mouth 2 (two) times daily.      . folic acid (FOLVITE) 1 MG tablet Take 1 mg by mouth daily.      . furosemide (LASIX) 40 MG tablet Take 40 mg by mouth every morning.      . hydrocortisone cream 1 % Apply 1 application topically 2 (two) times daily. To flaking skin and rash      . hydrOXYzine (ATARAX/VISTARIL) 10 MG tablet Take 10  mg by mouth at bedtime.       Marland Kitchen lisinopril (PRINIVIL,ZESTRIL) 40 MG tablet Take 1 tablet (40 mg total) by mouth daily.      . nitroGLYCERIN (NITROSTAT) 0.4 MG SL tablet Place 0.4 mg under the tongue every 5 (five) minutes as needed. For chest pain      . Omega-3 Fatty Acids (FISH OIL) 1000 MG CAPS Take 1,000 mg by mouth at bedtime.       Marland Kitchen oxyCODONE (OXY IR/ROXICODONE) 5 MG immediate release tablet Take 5-10 mg by mouth every 4 (four) hours as needed. Take 1 tablet for moderate pain and 2 tablets for severe pain      . pantoprazole (PROTONIX) 40 MG tablet Take 40 mg by mouth daily.      . polyethylene glycol (MIRALAX / GLYCOLAX) packet Take 17 g by mouth daily.      . protein supplement (RESOURCE BENEPROTEIN) POWD Take 2 scoop by mouth 2 (two) times  daily. In beverage      . spironolactone (ALDACTONE) 25 MG tablet Take 12.5 mg by mouth daily.      . vitamin C (ASCORBIC ACID) 500 MG tablet Take 500 mg by mouth at bedtime.        Assessment: 79 y/o female SNF resident brought to the ED after she passed out. She has been complaining of right groin, thigh pain since yesterday. Pharmacy consulted to begin vancomycin and aztreonam for RLE wounds, cellulitis. Currently patient is afebrile and WBC are elevated. Blood and urine cultures are pending. SCr is normal.  Vancomycin 1 g IV given at 15:58 and aztreonam 1 g IV given at 17:21 in the ED.  Goal of Therapy:  Vancomycin trough level 10-15 mcg/ml  Plan:  -Vancomycin 750 mg IV q24h -Aztreonam 1 g IV q8h -Follow-up renal function and [employer], [address].D., BCPS Clinical Pharmacist Pager: [phone] 10/05/2012 9:32 PM

## 2012-10-05 NOTE — H&P (Signed)
[facility] History and Physical  ARLENE BRICKEL ZOX:[id number] DOB: [birth year] DOA: 10/05/2012  Referring physician: Dr. Chaney Malling PCP: Londell Moh, MD  Specialists: Neurohospitalist  Chief Complaint: Passed out  HPI: Sara [name] is a 79 y.o. female , resident of [facility], [place] of cutaneous T-cell lymphoma/mycosis fungoidis, psoriasis, nonhealing wound right lateral malleolus, chronic A. fib, hypertension, CAD, chronic diastolic CHF, prior CVAs, HL presented to ED on 1/23 with an episode of passing out this afternoon. Patient is unable to provide details of the event. These are provided by patient's daughter, Ms. [name] who is at bedside. Since yesterday patient apparently has been complaining of progressive pain in her right groin/thigh, unable to straighten leg out, inability to stand. She had extensive x-rays of right lower extremity and pelvis which were apparently negative for fractures or acute events. Patient saw her in to visit her this afternoon. At approximately 12:30 PM, patient was at lunch room. She however was not answering appropriately and indicated that she was not feeling well. Daughter took her back to her room on her wheelchair. Patient was trying to eat ice cream in the room when she became unresponsive, leaning to the right with ice cream drooling from right side of her mouth. No preceding or subsequent symptoms of chest pain, palpitations or headaches. Exact duration of unresponsiveness unclear-daughter indicates 10-15 minutes. No seizure-like activity or urinary incontinence. She was breathing. Nurses were alerted, 911 was called and subsequently patient was laid supine on bed at which point she regained consciousness and was coherent. No facial asymmetry, slurred speech or asymmetrical limb movements noticed. She was brought to the emergency department where she was hypotensive-resolved after IV fluid bolus, leukocytosis, CT head showing old infarcts but  cannot rule out acute infarct, right leg showing chronic infected wounds and cellulitis. Neurology is consulted and recommended TIA workup. Hospitalist service is requested to admit for further evaluation and management.   Review of Systems: All systems reviewed and apart from history of presenting illness, are negative  Past Medical History  Diagnosis Date  . Hypertension   . Atrial fibrillation   . Psoriasis   . Congestive heart failure (CHF)   . History of CVA (cerebrovascular accident)   . Coronary artery disease   . Myocardial infarction   . CHF (congestive heart failure)   . Stroke   . Shortness of breath   . H/O hiatal hernia   . Closed fracture of head of left humerus 5/12  . Basal cell carcinoma   . 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  Flow cytometry 04/04/12: 91% cells CD4 positive CD 26 negative  . Hyperlipemia   . Pneumonia   . Wound of right leg 09/25/2012    Necrotic, open wound right lower leg; non healing   Past Surgical History  Procedure Date  . Tonsillectomy   . Dilation and curettage of uterus   . Eye surgery   . Cataracts   . Vertebroplasty    Social History:  reports that she has never smoked. She has never used smokeless tobacco. She reports that she does not drink alcohol or use illicit drugs. Resident of nursing facility. Dependent on ADLs.  Allergies  Allergen Reactions  . Penicillins Other (See Comments)    unknown  . Triamterene Other (See Comments)    unknown  . Alendronate Sodium  Rash    Family History  Problem Relation Age of Onset  . Stomach cancer Mother   . Pneumonia Father     Prior to Admission medications   Medication Sig Start Date End Date Taking? Authorizing Provider  acetaminophen (TYLENOL) 500 MG tablet Take 500 mg by mouth every 6 (six) hours as needed. For pain   Yes  Historical Provider, MD  aspirin 81 MG chewable tablet Chew 81 mg by mouth daily.   Yes Historical Provider, MD  bexarotene (TARGRETIN) 75 MG CAPS capsule Take 75 mg by mouth daily before supper. Give with food. Protect from light. CAUTION: Chemotherapy/Biotherapy 09/25/12  Yes Historical Provider, MD  carvedilol (COREG) 12.5 MG tablet Take 12.5 mg by mouth 2 (two) times daily with a meal.   Yes Historical Provider, MD  clopidogrel (PLAVIX) 75 MG tablet Take 75 mg by mouth daily.   Yes Historical Provider, MD  Coenzyme Q10 (CO Q 10) 100 MG CAPS Take 1 capsule by mouth daily.   Yes Historical Provider, MD  diltiazem (CARDIZEM CD) 120 MG 24 hr capsule Take 120 mg by mouth daily.   Yes Historical Provider, MD  docusate sodium (COLACE) 100 MG capsule Take 100 mg by mouth daily. For constipation   Yes Historical Provider, MD  ferrous sulfate 325 (65 FE) MG tablet Take 325 mg by mouth 2 (two) times daily.   Yes Historical Provider, MD  folic acid (FOLVITE) 1 MG tablet Take 1 mg by mouth daily.   Yes Historical Provider, MD  furosemide (LASIX) 40 MG tablet Take 40 mg by mouth every morning.   Yes Historical Provider, MD  hydrocortisone cream 1 % Apply 1 application topically 2 (two) times daily. To flaking skin and rash   Yes Historical Provider, MD  hydrOXYzine (ATARAX/VISTARIL) 10 MG tablet Take 10 mg by mouth at bedtime.    Yes Historical Provider, MD  lisinopril (PRINIVIL,ZESTRIL) 40 MG tablet Take 1 tablet (40 mg total) by mouth daily. 03/24/12  Yes Clydia Llano, MD  nitroGLYCERIN (NITROSTAT) 0.4 MG SL tablet Place 0.4 mg under the tongue every 5 (five) minutes as needed. For chest pain   Yes Historical Provider, MD  Omega-3 Fatty Acids (FISH OIL) 1000 MG CAPS Take 1,000 mg by mouth at bedtime.    Yes Historical Provider, MD  oxyCODONE (OXY IR/ROXICODONE) 5 MG immediate release tablet Take 5-10 mg by mouth every 4 (four) hours as needed. Take 1 tablet for moderate pain and 2 tablets for severe pain   Yes  Historical Provider, MD  pantoprazole (PROTONIX) 40 MG tablet Take 40 mg by mouth daily.   Yes Historical Provider, MD  polyethylene glycol (MIRALAX / GLYCOLAX) packet Take 17 g by mouth daily.   Yes Historical Provider, MD  protein supplement (RESOURCE BENEPROTEIN) POWD Take 2 scoop by mouth 2 (two) times daily. In beverage   Yes Historical Provider, MD  spironolactone (ALDACTONE) 25 MG tablet Take 12.5 mg by mouth daily.   Yes Historical Provider, MD  vitamin C (ASCORBIC ACID) 500 MG tablet Take 500 mg by mouth at bedtime.    Yes Historical Provider, MD   Physical Exam: Filed Vitals:   10/05/12 1815 10/05/12 1825 10/05/12 1830 10/05/12 1845  BP: 161/83 161/83 162/63 183/55  Pulse: 84 77 77 85  Temp:  98.4 F (36.9 C)    TempSrc:  Oral    Resp:  20    SpO2: 99% 98% 98% 97%     General exam: Elderly frail female patient  lying uncomfortably supine in bed with some right leg pain.  Head, eyes and ENT: Nontraumatic and normocephalic. Pupils equally reacting to light and accommodation. Oral mucosa mildly dry.  Lymphatics: No lymphadenopathy.  Neck: Supple. Right carotid bruit heard. No thyromegaly or JVD.  Respiratory system: Clear to auscultation.  Cardiovascular system: S1 and S2 heard, regular rate and rhythm. No JVD, murmurs or pedal edema.  Gastrointestinal system: Abdomen is nondistended, soft and nontender. Normal bowel sounds heard. No organomegaly or masses appreciated.  Central nervous system: Alert and oriented x3. No cranial nerve deficits.  Extremities: Right lower extremity held and partially flexed position at hip and knee. No acute findings in the right knee/groin or right knee region. Patient with significant pain on trying to fully extend right lower extremity? Precipitated by wounds in the right leg. Right thigh muscles spasm/voluntary contraction. Extensive right leg and foot wound is, largest one in the right Achilles tendon area-covered with slough, redness of  foot, foul smell. Peripheral pulses symmetrically felt. Patient has a skin tear with clean dressing on the left leg. Grade 5/5 power symmetrically upper limbs and left lower extremity. At least grade 3/5 power in right lower extremity-limited exam secondary to pain.  Skin: Areas of patchy bruising on upper extremities. Dry skin.  Psychiatry: Pleasant and cooperative.   Labs on Admission:  Basic Metabolic Panel:  Lab 10/05/12 3244  NA 137  K 4.4  CL 99  CO2 25  GLUCOSE 111*  BUN 31*  CREATININE 1.00  CALCIUM 9.2  MG --  PHOS --   Liver Function Tests:  Lab 10/05/12 1544  AST 23  ALT 25  ALKPHOS 57  BILITOT 0.1*  PROT 6.5  ALBUMIN 2.7*   No results found for this basename: LIPASE:5,AMYLASE:5 in the last 168 hours No results found for this basename: AMMONIA:5 in the last 168 hours CBC:  Lab 10/05/12 1544  WBC 25.1*  NEUTROABS 9.8*  HGB 9.9*  HCT 30.5*  MCV 85.9  PLT 359   Cardiac Enzymes:  Lab 10/05/12 1545  CKTOTAL --  CKMB --  CKMBINDEX --  TROPONINI <0.30    BNP (last 3 results)  Basename 10/05/12 1545 03/24/12 0400 03/22/12 0358  PROBNP 3200.0* 2150.0* 2193.0*   CBG:  Lab 10/05/12 1428  GLUCAP 136*    Radiological Exams on Admission: Dg Chest 1 View  10/05/2012  *RADIOLOGY REPORT*  Clinical Data: Leg ulceration.  CHEST - 1 VIEW  Comparison: PA and lateral chest 08/01/2012 and CT chest 06/02/2012.  Findings: Lungs are clear.  Heart size normal.  No pneumothorax or pleural fluid.  Remote fracture surgical neck left humerus is identified.  The patient is status post lower thoracic vertebroplasty.  IMPRESSION: No acute finding.   Original Report Authenticated By: Holley Dexter, M.D.    Dg Tibia/fibula Right  10/05/2012  *RADIOLOGY REPORT*  Clinical Data: Large ulceration over the Achilles tendon.  RIGHT TIBIA AND FIBULA - 2 VIEW  Comparison: Plain films right ankle 12/07/2011 and plain films right knee 01/14/2011.  Findings: Skin ulceration over  the Achilles tendon is identified. No underlying soft tissue gas collection, radiopaque foreign body or bony destructive change is noted.  Bones are osteopenic.  There is no fracture.  Degenerative disease of the midfoot is noted.  IMPRESSION: Large skin ulceration over the Achilles without plain film evidence of osteomyelitis.   Original Report Authenticated By: Holley Dexter, M.D.    Dg Ankle Complete Right  10/05/2012  *RADIOLOGY REPORT*  Clinical Data: Large  skin ulceration over the Achilles.  RIGHT ANKLE - COMPLETE 3+ VIEW  Comparison: MRI of the right ankle 01/09/2012 and plain films of the right foot 07/23/2012.  Findings: Large skin ulceration over the Achilles is identified. No underlying radiopaque foreign body, soft tissue gas collection or bony destructive change is noted.  Bones appear osteopenic. Midfoot degenerative disease of the right foot is noted.  IMPRESSION: Large skin ulceration without plain film evidence of osteomyelitis.   Original Report Authenticated By: Holley Dexter, M.D.    Ct Head Wo Contrast  10/05/2012  *RADIOLOGY REPORT*  Clinical Data: Episode of unresponsiveness for 2-3 minutes, history hypertension, coronary artery disease post MI, CHF  CT HEAD WITHOUT CONTRAST  Technique:  Contiguous axial images were obtained from the base of the skull through the vertex without contrast.  Comparison: 12/07/2011  Findings: Generalized atrophy. Normal ventricular morphology. No midline shift or mass effect. Small vessel chronic ischemic changes of deep cerebral white matter. No intracranial hemorrhage or mass lesion. area of low attenuation identified at left middle cerebral peduncle could be related to white matter disease though a developing infarct is not excluded. Tiny old right thalamic and right basal ganglia lacunar infarcts. Scattered streak artifacts from skull base at brain stem and cerebellum. Atherosclerotic calcifications of internal carotid arteries at skull base.  Visualized paranasal sinuses and mastoid air cells clear. No acute osseous findings.  IMPRESSION: Atrophy with extensive small vessel chronic ischemic changes of deep cerebral white matter. Tiny old right thalamic and right basal ganglia lacunar infarcts. Question white matter disease changes at the left middle cerebral peduncle though cannot completely exclude infarct.   Original Report Authenticated By: Ulyses Southward, M.D.     EKG: Independently reviewed. Sinus rhythm, normal axis and no acute changes. QTC 444 ms  Assessment/Plan Principal Problem:  *Syncope Active Problems:  Leukocytosis  Chronic diastolic HF (heart failure)  Anemia  Cellulitis  Wound of right leg  TIA (transient ischemic attack)  HTN (hypertension)  PAF (paroxysmal atrial fibrillation)  Right thigh pain   1. Syncope: Likely secondary to symptomatic hypotension. Admit to telemetry for observation and evaluation. Complete TIA workup. Neurology consultation appreciated. Also obtaining EEG. Continue aspirin and Plavix per home regimen. 2. Hypotension: Likely secondary to dehydration and polypharmacy. Blood pressures improved after 500 mL normal saline bolus and actually in the hypertensive range now. Allow for permissive hypertension in case patient has CVA. Held the diuretics. Hold some of her antihypertensives until tomorrow. 3. Dehydration: Brief IV fluids. 4. Infected chronic right leg wounds with cellulitis: Wound care consultation requested. Continue IV vancomycin and aztreonam per pharmacy. 5. Right thigh pain: Unclear etiology. Imaging studies from NH are not available. Will get x-rays of pelvis, right femur and knee to rule out fracture/dislocation. This may all be secondary to painful right leg and foot wounds and cellulitis. Pain medications. Monitor. Neurology concerned about TIA/CVA as etiology for inability to stand on right leg. 6. Chronic diastolic heart failure: Compensated. Temporarily hold  diuretics. 7. Anemia: Follow CBCs 8. Hypertension: Management as above. 9. Paroxysmal A. fib: Currently in sinus rhythm. Continue aspirin and Plavix. 10. CAD: Asymptomatic    Code Status: DO NOT RESUSCITATE Family Communication: Discussed with patient's daughter, Ms. [name] at bedside. Disposition Plan: Return to SNF when medically stable.  Time spent: 65 minutes  [facility] Pager [phone]  If 7PM-7AM, please contact night-coverage [email or web address] Password [facility] 10/05/2012, 7:13 PM

## 2012-10-05 NOTE — ED Notes (Signed)
Pt states she had "an episode" at her SNF. Pt states she has had similar episode 1 other time when she had a medication change. Pt has hx of 3 cvas with mild left side residual. No new weakness noted, no facial droop or drift. Speech is clear. Pt states she was started on new medications this past week.

## 2012-10-06 ENCOUNTER — Inpatient Hospital Stay (HOSPITAL_COMMUNITY): Payer: Medicare Other

## 2012-10-06 DIAGNOSIS — L8990 Pressure ulcer of unspecified site, unspecified stage: Secondary | ICD-10-CM

## 2012-10-06 DIAGNOSIS — I6340 Cerebral infarction due to embolism of unspecified cerebral artery: Secondary | ICD-10-CM

## 2012-10-06 DIAGNOSIS — L89509 Pressure ulcer of unspecified ankle, unspecified stage: Secondary | ICD-10-CM

## 2012-10-06 LAB — LIPID PANEL
Cholesterol: 147 mg/dL (ref 0–200)
HDL: 29 mg/dL — ABNORMAL LOW (ref >39)
Triglycerides: 129 mg/dL (ref <150)

## 2012-10-06 LAB — BASIC METABOLIC PANEL
CO2: 25 mEq/L (ref 19–32)
Chloride: 103 mEq/L (ref 96–112)
Glucose, Bld: 106 mg/dL — ABNORMAL HIGH (ref 70–99)
Potassium: 4.5 mEq/L (ref 3.5–5.1)
Sodium: 138 mEq/L (ref 135–145)

## 2012-10-06 LAB — CBC
Hemoglobin: 9.3 g/dL — ABNORMAL LOW (ref 12.0–15.0)
MCH: 28.2 pg (ref 26.0–34.0)
MCV: 85.5 fL (ref 78.0–100.0)
Platelets: 351 10*3/uL (ref 150–400)
RBC: 3.3 MIL/uL — ABNORMAL LOW (ref 3.87–5.11)
WBC: 23.4 10*3/uL — ABNORMAL HIGH (ref 4.0–10.5)

## 2012-10-06 LAB — RAPID URINE DRUG SCREEN, HOSP PERFORMED
Amphetamines: NOT DETECTED
Barbiturates: NOT DETECTED
Tetrahydrocannabinol: NOT DETECTED

## 2012-10-06 LAB — HEMOGLOBIN A1C: Mean Plasma Glucose: 126 mg/dL — ABNORMAL HIGH (ref <117)

## 2012-10-06 MED ORDER — SILVER SULFADIAZINE 1 % EX CREA
TOPICAL_CREAM | Freq: Every day | CUTANEOUS | Status: DC
  Administered 2012-10-06: 11:00:00 via TOPICAL
  Filled 2012-10-06: qty 85

## 2012-10-06 MED ORDER — LISINOPRIL 5 MG PO TABS
5.0000 mg | ORAL_TABLET | Freq: Every day | ORAL | Status: DC
  Administered 2012-10-06 – 2012-10-11 (×6): 5 mg via ORAL
  Filled 2012-10-06 (×6): qty 1

## 2012-10-06 MED ORDER — DOCUSATE SODIUM 100 MG PO CAPS
100.0000 mg | ORAL_CAPSULE | Freq: Two times a day (BID) | ORAL | Status: DC
  Administered 2012-10-06 – 2012-10-11 (×10): 100 mg via ORAL
  Filled 2012-10-06 (×10): qty 1

## 2012-10-06 MED ORDER — COLLAGENASE 250 UNIT/GM EX OINT
TOPICAL_OINTMENT | Freq: Every day | CUTANEOUS | Status: DC
  Administered 2012-10-07 – 2012-10-09 (×3): via TOPICAL
  Filled 2012-10-06: qty 30

## 2012-10-06 MED ORDER — OXYCODONE HCL 5 MG PO TABS
5.0000 mg | ORAL_TABLET | ORAL | Status: DC | PRN
  Administered 2012-10-06: 5 mg via ORAL
  Administered 2012-10-06 – 2012-10-09 (×6): 10 mg via ORAL
  Administered 2012-10-09: 5 mg via ORAL
  Administered 2012-10-09 – 2012-10-11 (×4): 10 mg via ORAL
  Filled 2012-10-06 (×3): qty 2
  Filled 2012-10-06: qty 1
  Filled 2012-10-06 (×10): qty 2

## 2012-10-06 MED ORDER — SENNA 8.6 MG PO TABS
1.0000 | ORAL_TABLET | Freq: Every day | ORAL | Status: DC
  Administered 2012-10-06 – 2012-10-10 (×5): 8.6 mg via ORAL
  Filled 2012-10-06 (×8): qty 1

## 2012-10-06 NOTE — Clinical Social Work Psychosocial (Signed)
     Clinical Social Work Department BRIEF PSYCHOSOCIAL ASSESSMENT 10/06/2012  Patient:  Sara [name]     Account Number:  [id number]     Admit date:  10/05/2012  Clinical Social Worker:  Peggyann Shoals  Date/Time:  10/06/2012 03:58 PM  Referred by:  Physician  Date Referred:  10/06/2012 Referred for  SNF Placement  Other - See comment   Other Referral:   Pt was admitted from facility   Interview type:  Patient Other interview type:    PSYCHOSOCIAL DATA Living Status:  FACILITY Admitted from facility:  [facility] Level of care:  Skilled Nursing Facility Primary support name:  Sara [name] Primary support relationship to patient:  CHILD, ADULT Degree of support available:   supportive    CURRENT CONCERNS Current Concerns  Post-Acute Placement   Other Concerns:    SOCIAL WORK ASSESSMENT / PLAN CSW met with pt to address consult. CSW introduced herself and explained role of social work. CSW also explained the process of discharging to [facility].    Pt is [name] resident of [facility]. CSW confirmed with [facility] that pt has [name] bed hold and will able to return when medically stable. MD anticipates this will be on Monday.    CSW will continue to follow to faciltate discharge to [facility].   Assessment/plan status:  Psychosocial Support/Ongoing Assessment of Needs Other assessment/ plan:   Information/referral to community resources:   Pt is [name] long term resident of [facility]    PATIENTS/FAMILYS RESPONSE TO PLAN OF CARE: Pt was very pleasant and agreeable to discharge plan to [facility].

## 2012-10-06 NOTE — Progress Notes (Addendum)
[facility] PROGRESS NOTE  Sara [name] AVW:[id number] DOB: September 15, 1918 DOA: 10/05/2012 PCP: Londell Moh, MD  Assessment/Plan: Syncope Likely secondary to symptomatic hypotension. Continue on telemetry. Continue TIA workup. MRI brain negative for acute infarct. 2D Echo and carotid dopplers pending. Appriciate neurology evaluation. EEG pending. Continue aspirin and Plavix per home regimen.  Hypotension Likely secondary to dehydration and polypharmacy. Blood pressures improved after 500 mL normal saline bolus and actually in the hypertensive range now. Held spironolactone. Start lower dose of lisinopril. Titrate blood pressure medications as tolerated.  Dehydration Resolved with IV hydration.   Chronic right leg wounds with cellulitis Continue wound care. Discussed with Dr. Lajoyce Corners, ortho. Continue IV vancomycin and aztreonam per pharmacy, transition to doxycyline and cefuroxime after Dr. Audrie Lia evaluation.  Cutaneous T-cell lymphoma/mycosis fungoides Management per Dr. Cyndie Chime, on low-dose Targretin 75 mg daily as outpatient.  Right thigh pain Unclear etiology. X-rays of pelvis, right femur and knee negative. Continue pain medications.   Chronic diastolic heart failure Compensated. Continue diuretics, except spironolactone. Resume tomorrow?  Anemia Likely due to chronic disease.  Hypertension Management as above.  Paroxysmal A. Fib Currently in sinus rhythm. Continue aspirin and Plavix.  CAD On Aspirin and plavix.  Leukocytosis Likely due to cellulitis and lymphoma.  Code Status: DNR/DNI Family Communication: No family at bedside. Disposition Plan: Pending.   Consultants:  Neurology  Dr. Lajoyce Corners  Wound care  Procedures:  None.  Antibiotics:  Vancomycin 10/05/2012 >>  Aztreonam 10/05/2012 >>  HPI/Subjective: Complaining of pain. Feeling better today.  Objective: Filed Vitals:   10/05/12 2036 10/05/12 2045 10/06/12 0117 10/06/12 0629  BP:  160/68  138/50 138/60  Pulse: 80  75 83  Temp: 98.5 F (36.9 C)  97.4 F (36.3 C) 100 F (37.8 C)  TempSrc: Oral  Oral Oral  Resp: 20  18 16   Height: 5\' 7"  (1.702 m)     Weight: 65.2 kg (143 lb 11.8 oz)     SpO2:  98% 96% 96%    Intake/Output Summary (Last 24 hours) at 10/06/12 0913 Last data filed at 10/06/12 0700  Gross per 24 hour  Intake   1675 ml  Output    350 ml  Net   1325 ml   Filed Weights   10/05/12 2036  Weight: 65.2 kg (143 lb 11.8 oz)    Exam: Physical Exam: General: Awake, Oriented, No acute distress. HEENT: EOMI. Neck: Supple CV: S1 and S2 Lungs: Clear to ascultation bilaterally Abdomen: Soft, Nontender, Nondistended, +bowel sounds. Ext: Good pulses. Trace edema. Multiple ulcerated lesion on legs bilaterally, most prominent on right medial ankle.  Data Reviewed: Basic Metabolic Panel:  Lab 10/06/12 8295 10/05/12 1544  NA 138 137  K 4.5 4.4  CL 103 99  CO2 25 25  GLUCOSE 106* 111*  BUN 20 31*  CREATININE 0.62 1.00  CALCIUM 8.8 9.2  MG -- --  PHOS -- --   Liver Function Tests:  Lab 10/05/12 1544  AST 23  ALT 25  ALKPHOS 57  BILITOT 0.1*  PROT 6.5  ALBUMIN 2.7*   No results found for this basename: LIPASE:5,AMYLASE:5 in the last 168 hours No results found for this basename: AMMONIA:5 in the last 168 hours CBC:  Lab 10/06/12 0648 10/05/12 1544  WBC 23.4* 25.1*  NEUTROABS -- 9.8*  HGB 9.3* 9.9*  HCT 28.2* 30.5*  MCV 85.5 85.9  PLT 351 359   Cardiac Enzymes:  Lab 10/05/12 1545  CKTOTAL --  CKMB --  CKMBINDEX --  TROPONINI <0.30   BNP (last 3 results)  Basename 10/05/12 1545 03/24/12 0400 03/22/12 0358  PROBNP 3200.0* 2150.0* 2193.0*   CBG:  Lab 10/05/12 1428  GLUCAP 136*    Recent Results (from the past 240 hour(s))  CULTURE, BLOOD (ROUTINE X 2)     Status: Normal (Preliminary result)   Collection Time   10/05/12  3:45 PM      Component Value Range Status Comment   Specimen Description BLOOD ARM RIGHT   Final     Special Requests BOTTLES DRAWN AEROBIC AND ANAEROBIC 10CC   Final    Culture  Setup Time 10/05/2012 23:37   Final    Culture     Final    Value:        BLOOD CULTURE RECEIVED NO GROWTH TO DATE CULTURE WILL BE HELD FOR 5 DAYS BEFORE ISSUING A FINAL NEGATIVE REPORT   Report Status PENDING   Incomplete   CULTURE, BLOOD (ROUTINE X 2)     Status: Normal (Preliminary result)   Collection Time   10/05/12  4:00 PM      Component Value Range Status Comment   Specimen Description BLOOD HAND RIGHT   Final    Special Requests BOTTLES DRAWN AEROBIC ONLY 10CC   Final    Culture  Setup Time 10/05/2012 23:37   Final    Culture     Final    Value:        BLOOD CULTURE RECEIVED NO GROWTH TO DATE CULTURE WILL BE HELD FOR 5 DAYS BEFORE ISSUING A FINAL NEGATIVE REPORT   Report Status PENDING   Incomplete      Studies: Dg Chest 1 View  10/05/2012  *RADIOLOGY REPORT*  Clinical Data: Leg ulceration.  CHEST - 1 VIEW  Comparison: PA and lateral chest 08/01/2012 and CT chest 06/02/2012.  Findings: Lungs are clear.  Heart size normal.  No pneumothorax or pleural fluid.  Remote fracture surgical neck left humerus is identified.  The patient is status post lower thoracic vertebroplasty.  IMPRESSION: No acute finding.   Original Report Authenticated By: Holley Dexter, M.D.    Dg Pelvis 1-2 Views  10/05/2012  *RADIOLOGY REPORT*  Clinical Data: Right thigh pain.  PELVIS - 1-2 VIEW  Comparison: Pelvic CT 06/02/2012  Findings: Single view of the pelvis was obtained.  The pelvic bony ring is intact.  There are vascular calcifications.  No gross abnormality to either hip.  Nonspecific bowel gas pattern.  Stool in the rectal region.  IMPRESSION: No acute bony abnormality.   Original Report Authenticated By: Richarda Overlie, M.D.    Dg Femur Right  10/05/2012  *RADIOLOGY REPORT*  Clinical Data: Right thigh pain.  RIGHT FEMUR - 2 VIEW  Comparison: Pelvis 10/05/2012  Findings: Two views of the right femur were obtained.  No evidence  for acute fracture.  The right hip is located.  There are vascular calcifications.  Limited evaluation of the knee joint.  IMPRESSION: No evidence for acute fracture.   Original Report Authenticated By: Richarda Overlie, M.D.    Dg Knee 1-2 Views Right  10/05/2012  *RADIOLOGY REPORT*  Clinical Data: Right thigh pain.  Rule out fracture.  RIGHT KNEE - 1-2 VIEW  Comparison: Right femur 10/05/2012  Findings: Two views of the knee were obtained.  The knee is located without acute fracture.  There are enthesopathic changes involving the patella.  Limited evaluation for joint space narrowing.  IMPRESSION: No acute bony abnormality.   Original Report Authenticated By: Madelaine Bhat  Lowella Dandy, M.D.    Dg Tibia/fibula Right  10/05/2012  *RADIOLOGY REPORT*  Clinical Data: Large ulceration over the Achilles tendon.  RIGHT TIBIA AND FIBULA - 2 VIEW  Comparison: Plain films right ankle 12/07/2011 and plain films right knee 01/14/2011.  Findings: Skin ulceration over the Achilles tendon is identified. No underlying soft tissue gas collection, radiopaque foreign body or bony destructive change is noted.  Bones are osteopenic.  There is no fracture.  Degenerative disease of the midfoot is noted.  IMPRESSION: Large skin ulceration over the Achilles without plain film evidence of osteomyelitis.   Original Report Authenticated By: Holley Dexter, M.D.    Dg Ankle Complete Right  10/05/2012  *RADIOLOGY REPORT*  Clinical Data: Large skin ulceration over the Achilles.  RIGHT ANKLE - COMPLETE 3+ VIEW  Comparison: MRI of the right ankle 01/09/2012 and plain films of the right foot 07/23/2012.  Findings: Large skin ulceration over the Achilles is identified. No underlying radiopaque foreign body, soft tissue gas collection or bony destructive change is noted.  Bones appear osteopenic. Midfoot degenerative disease of the right foot is noted.  IMPRESSION: Large skin ulceration without plain film evidence of osteomyelitis.   Original Report  Authenticated By: Holley Dexter, M.D.    Ct Head Wo Contrast  10/05/2012  *RADIOLOGY REPORT*  Clinical Data: Episode of unresponsiveness for 2-3 minutes, history hypertension, coronary artery disease post MI, CHF  CT HEAD WITHOUT CONTRAST  Technique:  Contiguous axial images were obtained from the base of the skull through the vertex without contrast.  Comparison: 12/07/2011  Findings: Generalized atrophy. Normal ventricular morphology. No midline shift or mass effect. Small vessel chronic ischemic changes of deep cerebral white matter. No intracranial hemorrhage or mass lesion. area of low attenuation identified at left middle cerebral peduncle could be related to white matter disease though a developing infarct is not excluded. Tiny old right thalamic and right basal ganglia lacunar infarcts. Scattered streak artifacts from skull base at brain stem and cerebellum. Atherosclerotic calcifications of internal carotid arteries at skull base. Visualized paranasal sinuses and mastoid air cells clear. No acute osseous findings.  IMPRESSION: Atrophy with extensive small vessel chronic ischemic changes of deep cerebral white matter. Tiny old right thalamic and right basal ganglia lacunar infarcts. Question white matter disease changes at the left middle cerebral peduncle though cannot completely exclude infarct.   Original Report Authenticated By: Ulyses Southward, M.D.    Mr Laqueta Jean Wo Contrast  10/05/2012  *RADIOLOGY REPORT*  Clinical Data: Syncope with right leg hemiparesis.  Atrial fibrillation  MRI HEAD WITHOUT AND WITH CONTRAST  Technique:  Multiplanar, multiecho pulse sequences of the brain and surrounding structures were obtained according to standard protocol without and with intravenous contrast  Contrast: 12mL MULTIHANCE GADOBENATE DIMEGLUMINE 529 MG/ML IV SOLN  Comparison: CT head 10/05/2012  Findings: Negative for acute infarct.  Chronic microvascular ischemic changes have progressed since the prior MRI of  12/08/2011.  There is diffuse chronic ischemia throughout the cerebral white matter.  There is chronic ischemia in the thalami and basal ganglia,  mid brain and pons bilaterally.  CT abnormality in the left mid brain is chronic.  Negative for intracranial hemorrhage.  No mass or edema.  Postcontrast imaging of the brain reveals normal enhancement.  No enhancing mass lesion is present.  Mild mucosal thickening right maxillary sinus.  IMPRESSION: No acute infarct.  Advanced chronic microvascular ischemia, with progression from 12/08/2011.   Original Report Authenticated By: Janeece Riggers, M.D.  Scheduled Meds:   . aspirin  81 mg Oral Daily  . aztreonam  1 g Intravenous Q8H  . bexarotene  75 mg Oral QAC supper  . carvedilol  12.5 mg Oral BID WC  . clopidogrel  75 mg Oral Daily  . diltiazem  120 mg Oral Daily  . docusate sodium  100 mg Oral BID  . enoxaparin (LOVENOX) injection  40 mg Subcutaneous Q24H  . ferrous sulfate  325 mg Oral BID  . folic acid  1 mg Oral Daily  . furosemide  40 mg Oral q morning - 10a  . hydrocortisone cream  1 application Topical BID  . hydrOXYzine  10 mg Oral QHS  . pantoprazole  40 mg Oral Daily  . polyethylene glycol  17 g Oral Daily  . protein supplement  2 scoop Oral BID  . senna  1 tablet Oral QHS  . silver sulfADIAZINE   Topical Daily  . vancomycin  750 mg Intravenous Q24H   Continuous Infusions:   Principal Problem:  *Syncope Active Problems:  Leukocytosis  Chronic diastolic HF (heart failure)  Anemia  Cellulitis  Wound of right leg  TIA (transient ischemic attack)  HTN (hypertension)  PAF (paroxysmal atrial fibrillation)  Right thigh pain  Dehydration  Hypotension    Jazen Spraggins A, MD  [facility] Pager [phone]. If 7PM-7AM, please contact night-coverage at [email or web address], password [facility] 10/06/2012, 9:13 AM  LOS: 1 day

## 2012-10-06 NOTE — Procedures (Signed)
History: 79 yo F with episode of altered consciousness yesterday to which she is amnestic.   Sedation: None  Background: There is a well defined posterior dominant rhythm of 8.5 Hz that attenuates with eye opening. There is sleep seen with symmetric structures. There is occasional frontally predominant rhythmic delta activity that is seen in drowsiness(can be normal at this age)   Photic stimulation: Physiologic driving is not performed.   EEG Diagnosis: Normal EEG  Clinical Interpretation: This normal EEG is recorded in the waking and sleep state. There was no seizure or seizure predisposition recorded on this study.   Ritta Slot, MD [facility] [phone]  If 7pm- 7am, please page neurology on call at [phone].

## 2012-10-06 NOTE — Consult Note (Signed)
WOC consult Note Reason for Consult: Consult requested for multiple wounds.  Pt states she has a history of eczma and has developed multiple wounds as a result of this, combined with a lymphoma diagnosis, she feels that this is the etiology for the wounds. Wound type: Several partial thickness wounds to bilat arms and upper legs.  All red and moist without odor or drainage. Left arm1X1X.1cm.  Right arm 2X2X.1cm Right outer upper leg 2X5X.1cm Right upper leg 1X1X.1cm, 3X2X.1cm, 2X2X.1cm, (dark red wound bed)  2 locations of full thickness wounds.  Pt denies that these are pressure ulcers.  She is followed by Dr Lajoyce Corners as an outpatient, who has ordered Silvadene Q day to areas, pt states, despite a sensitivity listed when the order is entered. She was due for an appointment today. Pressure Ulcer POA: NO Measurement: Right heel 4X3cm, 100% slough/eschar, strong foul odor, mod yellow drainage, locatedover achilias tendon. Right foot with several full thickness wounds near ankle; 3X2, 1X.8cm, 3X2cm, 2X1cm.  All with slough to wound bed and same appearance as described above.  Very painful to touch. Wound bed: Dressing procedure/placement/frequency: Continue Silvadene as previously ordered.  If consult desired for further plan of care by Dr Lajoyce Corners, please consult him.  Discussed plan of care with primary team.  Float heels to reduce pressure to areas.  Foam dressings to partial thickness wounds to minimize discomfort and absorb drainage.  Will not plan to follow further unless re-consulted.  [address] RN, MSN, [employer]  [phone]

## 2012-10-06 NOTE — Progress Notes (Signed)
Subjective: No further episodes of alteration of conciousness.   Exam: Filed Vitals:   10/06/12 1400  BP: 120/62  Pulse: 72  Temp: 100.3 F (37.9 C)  Resp: 18   Gen: In bed, NAD MS: Awake, ALert, interactive and appropriate.  ZO:XWRU, fixates and tracks examiner.  Motor: Unable to extend right leg due to pain.  Sensory: intact to LT  MRI reviewed, no acute stroke.  EEG reviewed - no evidence of seizure predisposition.   Impression: 79 yo F with transient alteration of consciousness in the setting of hypotension. Without an abnormal EEG, my index of suspicion for seizure is relatively low and I would not start an AED at this time. I suspect that it represents symptomatic hypotension.   Recommendations: 1)No further neurological workup recommended at this time. Neurology will sign off, please call with any further questions.   Ritta Slot, MD [facility] [phone]  If 7pm- 7am, please page neurology on call at [phone].

## 2012-10-06 NOTE — Progress Notes (Signed)
Portable EEG completed

## 2012-10-06 NOTE — Consult Note (Signed)
Reason for Consult: Gangrene right lower extremity Referring Physician: Dr. Kathrin Greathouse is an 79 y.o. female.  HPI: Patient is a 79 year old woman with severe peripheral vascular disease. 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  Past Medical History  Diagnosis Date  . Hypertension   . Atrial fibrillation   . Psoriasis   . Congestive heart failure (CHF)   . History of CVA (cerebrovascular accident)   . Coronary artery disease   . Myocardial infarction   . CHF (congestive heart failure)   . Stroke   . Shortness of breath   . H/O hiatal hernia   . Closed fracture of head of left humerus 5/12  . Basal cell carcinoma   . 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  Flow cytometry 04/04/12: 91% cells CD4 positive CD 26 negative  . Hyperlipemia   . Pneumonia   . Wound of right leg 09/25/2012    Necrotic, open wound right lower leg; non healing    Past Surgical History  Procedure Date  . Tonsillectomy   . Dilation and curettage of uterus   . Eye surgery   . Cataracts   . Vertebroplasty     Family History  Problem Relation Age of Onset  . Stomach cancer Mother   . Pneumonia Father     Social History:  reports that she has never smoked. She has never used smokeless tobacco. She reports that she does not drink alcohol or use illicit drugs.  Allergies:  Allergies  Allergen Reactions  . Penicillins Other (See Comments)    unknown  . Triamterene Other (See Comments)    unknown  . Alendronate Sodium Rash    Medications: I have reviewed the patient's current medications.  Results for orders placed during the hospital encounter of 10/05/12 (from the past 48 hour(s))  GLUCOSE, CAPILLARY     Status: Abnormal     Collection Time   10/05/12  2:28 PM      Component Value Range Comment   Glucose-Capillary 136 (*) 70 - 99 mg/dL   CBC WITH DIFFERENTIAL     Status: Abnormal   Collection Time   10/05/12  3:44 PM      Component Value Range Comment   WBC 25.1 (*) 4.0 - 10.5 K/uL    RBC 3.55 (*) 3.87 - 5.11 MIL/uL    Hemoglobin 9.9 (*) 12.0 - 15.0 g/dL    HCT 16.1 (*) 09.6 - 46.0 %    MCV 85.9  78.0 - 100.0 fL    MCH 27.9  26.0 - 34.0 pg    MCHC 32.5  30.0 - 36.0 g/dL    RDW 04.5  40.9 - 81.1 %    Platelets 359  150 - 400 K/uL    Neutrophils Relative 39 (*) 43 - 77 %    Lymphocytes Relative 55 (*) 12 - 46 %    Monocytes Relative 5  3 - 12 %    Eosinophils Relative 1  0 - 5 %    Basophils Relative 0  0 - 1 %    Neutro Abs 9.8 (*) 1.7 - 7.7 K/uL    Lymphs Abs 13.7 (*) 0.7 - 4.0 K/uL    Monocytes Absolute 1.3 (*)  0.1 - 1.0 K/uL    Eosinophils Absolute 0.3  0.0 - 0.7 K/uL    Basophils Absolute 0.0  0.0 - 0.1 K/uL    WBC Morphology ABSOLUTE LYMPHOCYTOSIS     COMPREHENSIVE METABOLIC PANEL     Status: Abnormal   Collection Time   10/05/12  3:44 PM      Component Value Range Comment   Sodium 137  135 - 145 mEq/L    Potassium 4.4  3.5 - 5.1 mEq/L    Chloride 99  96 - 112 mEq/L    CO2 25  19 - 32 mEq/L    Glucose, Bld 111 (*) 70 - 99 mg/dL    BUN 31 (*) 6 - 23 mg/dL    Creatinine, Ser 1.61  0.50 - 1.10 mg/dL    Calcium 9.2  8.4 - 09.6 mg/dL    Total Protein 6.5  6.0 - 8.3 g/dL    Albumin 2.7 (*) 3.5 - 5.2 g/dL    AST 23  0 - 37 U/L    ALT 25  0 - 35 U/L    Alkaline Phosphatase 57  39 - 117 U/L    Total Bilirubin 0.1 (*) 0.3 - 1.2 mg/dL    GFR calc non Af Amer 48 (*) >90 mL/min    GFR calc Af Amer 55 (*) >90 mL/min   LACTIC ACID, PLASMA     Status: Normal   Collection Time   10/05/12  3:44 PM      Component Value Range Comment   Lactic Acid, Venous 1.6  0.5 - 2.2 mmol/L   PROCALCITONIN     Status: Normal   Collection Time   10/05/12  3:44 PM      Component Value Range Comment   Procalcitonin  <0.10     CULTURE, BLOOD (ROUTINE X 2)     Status: Normal (Preliminary result)   Collection Time   10/05/12  3:45 PM      Component Value Range Comment   Specimen Description BLOOD ARM RIGHT      Special Requests BOTTLES DRAWN AEROBIC AND ANAEROBIC 10CC      Culture  Setup Time 10/05/2012 23:37      Culture        Value:        BLOOD CULTURE RECEIVED NO GROWTH TO DATE CULTURE WILL BE HELD FOR 5 DAYS BEFORE ISSUING A FINAL NEGATIVE REPORT   Report Status PENDING     TROPONIN I     Status: Normal   Collection Time   10/05/12  3:45 PM      Component Value Range Comment   Troponin I <0.30  <0.30 ng/mL   PRO B NATRIURETIC PEPTIDE     Status: Abnormal   Collection Time   10/05/12  3:45 PM      Component Value Range Comment   Pro B Natriuretic peptide (BNP) 3200.0 (*) 0 - 450 pg/mL   CULTURE, BLOOD (ROUTINE X 2)     Status: Normal (Preliminary result)   Collection Time   10/05/12  4:00 PM      Component Value Range Comment   Specimen Description BLOOD HAND RIGHT      Special Requests BOTTLES DRAWN AEROBIC ONLY 10CC      Culture  Setup Time 10/05/2012 23:37      Culture        Value:        BLOOD CULTURE RECEIVED NO GROWTH TO DATE CULTURE WILL BE HELD FOR 5  DAYS BEFORE ISSUING A FINAL NEGATIVE REPORT   Report Status PENDING     URINALYSIS, ROUTINE W REFLEX MICROSCOPIC     Status: Abnormal   Collection Time   10/05/12  6:25 PM      Component Value Range Comment   Color, Urine YELLOW  YELLOW    APPearance CLEAR  CLEAR    Specific Gravity, Urine 1.021  1.005 - 1.030    pH 5.0  5.0 - 8.0    Glucose, UA NEGATIVE  NEGATIVE mg/dL    Hgb urine dipstick NEGATIVE  NEGATIVE    Bilirubin Urine NEGATIVE  NEGATIVE    Ketones, ur NEGATIVE  NEGATIVE mg/dL    Protein, ur NEGATIVE  NEGATIVE mg/dL    Urobilinogen, UA 0.2  0.0 - 1.0 mg/dL    Nitrite NEGATIVE  NEGATIVE    Leukocytes, UA TRACE (*) NEGATIVE   URINE MICROSCOPIC-ADD ON     Status: Abnormal   Collection Time   10/05/12  6:25 PM       Component Value Range Comment   Squamous Epithelial / LPF RARE  RARE    WBC, UA 0-2  <3 WBC/hpf    RBC / HPF 0-2  <3 RBC/hpf    Bacteria, UA RARE  RARE    Casts HYALINE CASTS (*) NEGATIVE   HEMOGLOBIN A1C     Status: Abnormal   Collection Time   10/05/12 10:03 PM      Component Value Range Comment   Hemoglobin A1C 6.0 (*) <5.7 %    Mean Plasma Glucose 126 (*) <117 mg/dL   BASIC METABOLIC PANEL     Status: Abnormal   Collection Time   10/06/12  6:48 AM      Component Value Range Comment   Sodium 138  135 - 145 mEq/L    Potassium 4.5  3.5 - 5.1 mEq/L    Chloride 103  96 - 112 mEq/L    CO2 25  19 - 32 mEq/L    Glucose, Bld 106 (*) 70 - 99 mg/dL    BUN 20  6 - 23 mg/dL    Creatinine, Ser 0.98  0.50 - 1.10 mg/dL    Calcium 8.8  8.4 - 11.9 mg/dL    GFR calc non Af Amer 76 (*) >90 mL/min    GFR calc Af Amer 89 (*) >90 mL/min   CBC     Status: Abnormal   Collection Time   10/06/12  6:48 AM      Component Value Range Comment   WBC 23.4 (*) 4.0 - 10.5 K/uL    RBC 3.30 (*) 3.87 - 5.11 MIL/uL    Hemoglobin 9.3 (*) 12.0 - 15.0 g/dL    HCT 14.7 (*) 82.9 - 46.0 %    MCV 85.5  78.0 - 100.0 fL    MCH 28.2  26.0 - 34.0 pg    MCHC 33.0  30.0 - 36.0 g/dL    RDW 56.2  13.0 - 86.5 %    Platelets 351  150 - 400 K/uL   LIPID PANEL     Status: Abnormal   Collection Time   10/06/12  6:48 AM      Component Value Range Comment   Cholesterol 147  0 - 200 mg/dL    Triglycerides 784  <696 mg/dL    HDL 29 (*) >29 mg/dL    Total CHOL/HDL Ratio 5.1      VLDL 26  0 - 40 mg/dL    LDL Cholesterol  92  0 - 99 mg/dL   URINE RAPID DRUG SCREEN (HOSP PERFORMED)     Status: Abnormal   Collection Time   10/06/12  7:58 AM      Component Value Range Comment   Opiates POSITIVE (*) NONE DETECTED    Cocaine NONE DETECTED  NONE DETECTED    Benzodiazepines NONE DETECTED  NONE DETECTED    Amphetamines NONE DETECTED  NONE DETECTED    Tetrahydrocannabinol NONE DETECTED  NONE DETECTED    Barbiturates NONE DETECTED   NONE DETECTED     Dg Chest 1 View  10/05/2012  *RADIOLOGY REPORT*  Clinical Data: Leg ulceration.  CHEST - 1 VIEW  Comparison: PA and lateral chest 08/01/2012 and CT chest 06/02/2012.  Findings: Lungs are clear.  Heart size normal.  No pneumothorax or pleural fluid.  Remote fracture surgical neck left humerus is identified.  The patient is status post lower thoracic vertebroplasty.  IMPRESSION: No acute finding.   Original Report Authenticated By: Holley Dexter, M.D.    Dg Pelvis 1-2 Views  10/05/2012  *RADIOLOGY REPORT*  Clinical Data: Right thigh pain.  PELVIS - 1-2 VIEW  Comparison: Pelvic CT 06/02/2012  Findings: Single view of the pelvis was obtained.  The pelvic bony ring is intact.  There are vascular calcifications.  No gross abnormality to either hip.  Nonspecific bowel gas pattern.  Stool in the rectal region.  IMPRESSION: No acute bony abnormality.   Original Report Authenticated By: Richarda Overlie, M.D.    Dg Femur Right  10/05/2012  *RADIOLOGY REPORT*  Clinical Data: Right thigh pain.  RIGHT FEMUR - 2 VIEW  Comparison: Pelvis 10/05/2012  Findings: Two views of the right femur were obtained.  No evidence for acute fracture.  The right hip is located.  There are vascular calcifications.  Limited evaluation of the knee joint.  IMPRESSION: No evidence for acute fracture.   Original Report Authenticated By: Richarda Overlie, M.D.    Dg Knee 1-2 Views Right  10/05/2012  *RADIOLOGY REPORT*  Clinical Data: Right thigh pain.  Rule out fracture.  RIGHT KNEE - 1-2 VIEW  Comparison: Right femur 10/05/2012  Findings: Two views of the knee were obtained.  The knee is located without acute fracture.  There are enthesopathic changes involving the patella.  Limited evaluation for joint space narrowing.  IMPRESSION: No acute bony abnormality.   Original Report Authenticated By: Richarda Overlie, M.D.    Dg Tibia/fibula Right  10/05/2012  *RADIOLOGY REPORT*  Clinical Data: Large ulceration over the Achilles tendon.  RIGHT  TIBIA AND FIBULA - 2 VIEW  Comparison: Plain films right ankle 12/07/2011 and plain films right knee 01/14/2011.  Findings: Skin ulceration over the Achilles tendon is identified. No underlying soft tissue gas collection, radiopaque foreign body or bony destructive change is noted.  Bones are osteopenic.  There is no fracture.  Degenerative disease of the midfoot is noted.  IMPRESSION: Large skin ulceration over the Achilles without plain film evidence of osteomyelitis.   Original Report Authenticated By: Holley Dexter, M.D.    Dg Ankle Complete Right  10/05/2012  *RADIOLOGY REPORT*  Clinical Data: Large skin ulceration over the Achilles.  RIGHT ANKLE - COMPLETE 3+ VIEW  Comparison: MRI of the right ankle 01/09/2012 and plain films of the right foot 07/23/2012.  Findings: Large skin ulceration over the Achilles is identified. No underlying radiopaque foreign body, soft tissue gas collection or bony destructive change is noted.  Bones appear osteopenic. Midfoot degenerative disease of the right foot  is noted.  IMPRESSION: Large skin ulceration without plain film evidence of osteomyelitis.   Original Report Authenticated By: Holley Dexter, M.D.    Ct Head Wo Contrast  10/05/2012  *RADIOLOGY REPORT*  Clinical Data: Episode of unresponsiveness for 2-3 minutes, history hypertension, coronary artery disease post MI, CHF  CT HEAD WITHOUT CONTRAST  Technique:  Contiguous axial images were obtained from the base of the skull through the vertex without contrast.  Comparison: 12/07/2011  Findings: Generalized atrophy. Normal ventricular morphology. No midline shift or mass effect. Small vessel chronic ischemic changes of deep cerebral white matter. No intracranial hemorrhage or mass lesion. area of low attenuation identified at left middle cerebral peduncle could be related to white matter disease though a developing infarct is not excluded. Tiny old right thalamic and right basal ganglia lacunar infarcts.  Scattered streak artifacts from skull base at brain stem and cerebellum. Atherosclerotic calcifications of internal carotid arteries at skull base. Visualized paranasal sinuses and mastoid air cells clear. No acute osseous findings.  IMPRESSION: Atrophy with extensive small vessel chronic ischemic changes of deep cerebral white matter. Tiny old right thalamic and right basal ganglia lacunar infarcts. Question white matter disease changes at the left middle cerebral peduncle though cannot completely exclude infarct.   Original Report Authenticated By: Ulyses Southward, M.D.    Mr Laqueta Jean Wo Contrast  10/05/2012  *RADIOLOGY REPORT*  Clinical Data: Syncope with right leg hemiparesis.  Atrial fibrillation  MRI HEAD WITHOUT AND WITH CONTRAST  Technique:  Multiplanar, multiecho pulse sequences of the brain and surrounding structures were obtained according to standard protocol without and with intravenous contrast  Contrast: 12mL MULTIHANCE GADOBENATE DIMEGLUMINE 529 MG/ML IV SOLN  Comparison: CT head 10/05/2012  Findings: Negative for acute infarct.  Chronic microvascular ischemic changes have progressed since the prior MRI of 12/08/2011.  There is diffuse chronic ischemia throughout the cerebral white matter.  There is chronic ischemia in the thalami and basal ganglia,  mid brain and pons bilaterally.  CT abnormality in the left mid brain is chronic.  Negative for intracranial hemorrhage.  No mass or edema.  Postcontrast imaging of the brain reveals normal enhancement.  No enhancing mass lesion is present.  Mild mucosal thickening right maxillary sinus.  IMPRESSION: No acute infarct.  Advanced chronic microvascular ischemia, with progression from 12/08/2011.   Original Report Authenticated By: Janeece Riggers, M.D.     Review of Systems  All other systems reviewed and are negative.   Blood pressure 120/62, pulse 72, temperature 100.3 F (37.9 C), temperature source Oral, resp. rate 18, height 5\' 7"  (1.702 m), weight  65.2 kg (143 lb 11.8 oz), SpO2 97.00%. Physical Exam On examination patient's right lower extremity is thin and atrophic there a black gangrenous ulcers covering most of the right foot. There is ischemic changes of the knee she is developed a flexion contracture of the knee do to the ischemic changes. Patient's left lower extremity shows some bruising but no ischemic ulcerations. Assessment/Plan: Assessment: Ischemic dry gangrene right lower extremity with flexion contracture of the right knee.  Plan: Discussed the options are continued wound care versus an above-the-knee amputation on the right. Discussed that all followup on Monday to see if the patient is interested in surgery. This was discussed at length with the patient and her daughter. We will change the Silvadene over to [facility] dressing changes daily  DUDA,MARCUS V 10/06/2012, 5:38 PM

## 2012-10-06 NOTE — Progress Notes (Signed)
VASCULAR LAB PRELIMINARY  PRELIMINARY  PRELIMINARY  PRELIMINARY  Carotid duplex  completed.    Preliminary report:  Bilateral:  No evidence of hemodynamically significant internal carotid artery stenosis.  Left ICA stent within normal limits.  Vertebral artery flow is antegrade.      Beola Vasallo, RVT 10/06/2012, 11:37 AM

## 2012-10-07 DIAGNOSIS — I5032 Chronic diastolic (congestive) heart failure: Secondary | ICD-10-CM

## 2012-10-07 DIAGNOSIS — D649 Anemia, unspecified: Secondary | ICD-10-CM

## 2012-10-07 LAB — URINE CULTURE: Colony Count: NO GROWTH

## 2012-10-07 NOTE — Evaluation (Signed)
Occupational Therapy Evaluation Patient Details Name: Sara [name] MRN: [id number] DOB: 10-Nov-1918 Today's Date: 10/07/2012 Time: 0827-0903 OT Time Calculation (min): 36 min  OT Assessment / Plan / Recommendation Clinical Impression  Pt admitted from [facility] s/p syncopal episode.  Pt with chronic R leg ulcers, discussion about possible AKA on Monday.  Pt independent with assistive devices until late December '13 during which time she transitioned from independent living to SNF.  Pt will benefit from acute OT services to address below problem list.  Recommending pt return to [facility] at discharge.    OT Assessment  Patient needs continued OT Services    Follow Up Recommendations  SNF    Barriers to Discharge      Equipment Recommendations  3 in 1 bedside comode    Recommendations for Other Services    Frequency  Min 2X/week    Precautions / Restrictions Precautions Precautions: Fall;Other (comment) (bleeding) Precaution Comments: pt bleeds easily with any tear to the skin Restrictions Weight Bearing Restrictions: Yes RLE Weight Bearing: Non weight bearing   Pertinent Vitals/Pain See vitals    ADL  Eating/Feeding: Performed;Set up Where Assessed - Eating/Feeding: Bed level Toilet Transfer: Performed;Maximal assistance Toilet Transfer Method: Squat pivot Toilet Transfer Equipment: Bedside commode Toileting - Clothing Manipulation and Hygiene: Performed;+1 Total assistance Where Assessed - Toileting Clothing Manipulation and Hygiene: Sit to stand from 3-in-1 or toilet Equipment Used: Gait belt ADL Comments: Pt able to push up through 3n1 armrests in order to lift bottom from 3n1 in order to receive +1 total assist for back peri care. Pt bleeding from multiple ulcers on R LE throughout session.  OT/PT assisted with clean up as pt experienced increased bleeding during bed mobility and toileting ADLs. RN present at bedside during session.      OT Diagnosis:  Generalized weakness;Acute pain  OT Problem List: Impaired balance (sitting and/or standing);Decreased strength;Decreased activity tolerance;Pain;Decreased knowledge of use of DME or AE OT Treatment Interventions: Self-care/ADL training;DME and/or AE instruction;Therapeutic activities;Patient/family education;Balance training   OT Goals Acute Rehab OT Goals OT Goal Formulation: With patient Time For Goal Achievement: 10/21/12 Potential to Achieve Goals: Fair ADL Goals Pt Will Perform Grooming: with set-up;Sitting, edge of bed;Sitting, chair;Unsupported ADL Goal: Grooming - Progress: Goal set today Pt Will Transfer to Toilet: with min assist;Stand pivot transfer;with DME;3-in-1 ADL Goal: Toilet Transfer - Progress: Goal set today Pt Will Perform Toileting - Clothing Manipulation: with min assist;Sitting on 3-in-1 or toilet;Standing ADL Goal: Toileting - Clothing Manipulation - Progress: Goal set today Pt Will Perform Toileting - Hygiene: with min assist;Sit to stand from 3-in-1/toilet ADL Goal: Toileting - Hygiene - Progress: Goal set today Miscellaneous OT Goals Miscellaneous OT Goal #1: Pt will perform bed mobility with mod assist as precursor for EOB ADLs. OT Goal: Miscellaneous Goal #1 - Progress: Goal set today Miscellaneous OT Goal #2: Pt will tolerate static sitting balance task EOB >5 min at supervision level as precursor to toilet transfer. OT Goal: Miscellaneous Goal #2 - Progress: Goal set today  Visit Information  Last OT Received On: 10/07/12 Assistance Needed: +2 PT/OT Co-Evaluation/Treatment: Yes    Subjective Data      Prior Functioning     Home Living Lives With: Alone Available Help at Discharge: Skilled Nursing Facility Type of Home: Skilled Nursing Facility Additional Comments: Pt is from [facility].  Reports she was in an independent living apartment until December 26 when [facility] Prior  Function Level of Independence: Independent with assistive device(s) Comments: Pt was independent until later december.  Reports that since she has been in skilled nursing she has needed assist with transfers and ADLs.  Communication Communication: No difficulties Dominant Hand: Right         Vision/Perception     Cognition  Overall Cognitive Status: Appears within functional limits for tasks assessed/performed Arousal/Alertness: Awake/alert Orientation Level: Appears intact for tasks assessed Behavior During Session: [facility] for tasks performed    Extremity/Trunk Assessment Right Upper Extremity Assessment RUE ROM/Strength/Tone: [facility] for tasks assessed Left Upper Extremity Assessment LUE ROM/Strength/Tone: WFL for tasks assessed Right Lower Extremity Assessment RLE ROM/Strength/Tone: Unable to fully assess;Due to pain;Deficits RLE ROM/Strength/Tone Deficits: R knee contracture in flexion Left Lower Extremity Assessment LLE ROM/Strength/Tone: Due to pain;Unable to fully assess;Deficits LLE ROM/Strength/Tone Deficits: grossly 4/5     Mobility Bed Mobility Bed Mobility: Supine to Sit;Sitting - Scoot to [place] of Bed;Sit to Supine Supine to Sit: 1: +2 Total assist;With rails Supine to Sit: Patient Percentage: 50% Sitting - Scoot to Edge of Bed: 1: +2 Total assist Sitting - Scoot to Edge of Bed: Patient Percentage: 50% Sit to Supine: 1: +2 Total assist;HOB flat Sit to Supine: Patient Percentage: 50% Details for Bed Mobility Assistance: Assist to RLE due to pain and knee contracted as well as to elevate trunk from bed. Transfers Transfers: Sit to Stand;Stand to Sit Sit to Stand: 3: Mod assist;From bed;From chair/3-in-1;With armrests;With upper extremity assist Stand to Sit: 3: Mod assist;To chair/3-in-1;With armrests;With upper extremity assist Details for Transfer Assistance: Pt unable to achieve full standing position.  Assist for steadying as pt not putting weight through RLE.  Cueing for safe hand placement     Shoulder Instructions     Exercise     Balance Balance Balance Assessed: Yes Static Sitting Balance Static Sitting - Balance Support: Bilateral upper extremity supported;Feet supported Static Sitting - Level of Assistance: 4: Min assist Static Sitting - Comment/# of Minutes: Min assist intermittently due to posterior lean   End of Session OT - End of Session Equipment Utilized During Treatment: Gait belt Activity Tolerance: Other (comment) (limited by increased bleeding during mobility and RLE pain) Patient left: in bed;with call bell/phone within reach Nurse Communication: Mobility status;Other (comment) (increased RLE bleeding)  GO   10/07/2012 Cipriano Mile OTR/L Pager [phone] Office [phone]   Cipriano Mile 10/07/2012, 9:48 AM

## 2012-10-07 NOTE — Evaluation (Signed)
Physical Therapy Evaluation Patient Details Name: Sara [name] MRN: [id number] DOB: Aug 22, 1919 Today's Date: 10/07/2012 Time: 4098-1191 PT Time Calculation (min): 44 min  PT Assessment / Plan / Recommendation Clinical Impression  Pt admitted s/p syncopal episode, no neurological deficits at this time. Pt with chronic R leg ulcers, discussion about possible AKA on Monday. Evaluation limited secondary to increased pain with RLE as well as multiple bleeding sites. Pt will benefit from skilled PT in the acute care setting in order to maximize functional mobility, and safety prior to d/c back to SNF    PT Assessment  Patient needs continued PT services    Follow Up Recommendations  SNF;Supervision/Assistance - 24 hour    Does the patient have the potential to tolerate intense rehabilitation      Barriers to Discharge        Equipment Recommendations  None recommended by PT    Recommendations for Other Services     Frequency Min 3X/week    Precautions / Restrictions Precautions Precautions: Fall;Other (comment) (bleeding) Precaution Comments: pt bleeds easily with any tear to the skin Restrictions Weight Bearing Restrictions: Yes RLE Weight Bearing: Non weight bearing   Pertinent Vitals/Pain Pain in RLE 6/10. Pain meds given prior to session.       Mobility  Bed Mobility Bed Mobility: Supine to Sit;Sitting - Scoot to [place] of Bed;Sit to Supine Supine to Sit: 1: +2 Total assist;With rails Supine to Sit: Patient Percentage: 50% Sitting - Scoot to Edge of Bed: 1: +2 Total assist Sitting - Scoot to Edge of Bed: Patient Percentage: 50% Sit to Supine: 1: +2 Total assist;HOB flat Sit to Supine: Patient Percentage: 50% Details for Bed Mobility Assistance: Assist to RLE due to pain and knee contracted as well as to elevate trunk from bed. Transfers Transfers: Stand to Sit;Sit to Stand;Squat Pivot Transfers Sit to Stand: 3: Mod assist;From bed;From chair/3-in-1;With armrests;With  upper extremity assist Stand to Sit: 3: Mod assist;To chair/3-in-1;With armrests;With upper extremity assist Squat Pivot Transfers: 2: Max assist Details for Transfer Assistance: Pt unable to achieve full standing position.  Assist for steadying as pt not putting weight through RLE. Cueing for safe hand placement Ambulation/Gait Ambulation/Gait Assistance: Not tested (comment)    Shoulder Instructions     Exercises     PT Diagnosis: Acute pain;Difficulty walking  PT Problem List: Decreased strength;Decreased activity tolerance;Decreased range of motion;Decreased mobility;Decreased knowledge of use of DME;Decreased safety awareness;Decreased knowledge of precautions;Pain;Decreased skin integrity PT Treatment Interventions: DME instruction;Gait training;Functional mobility training;Therapeutic activities;Therapeutic exercise;Patient/family education   PT Goals Acute Rehab PT Goals PT Goal Formulation: With patient Time For Goal Achievement: 10/21/12 Potential to Achieve Goals: Fair Pt will go Supine/Side to Sit: with min assist PT Goal: Supine/Side to Sit - Progress: Goal set today Pt will go Sit to Supine/Side: with min assist PT Goal: Sit to Supine/Side - Progress: Goal set today Pt will go Sit to Stand: with min assist PT Goal: Sit to Stand - Progress: Goal set today Pt will go Stand to Sit: with min assist PT Goal: Stand to Sit - Progress: Goal set today Pt will Transfer Bed to Chair/Chair to Bed: with min assist PT Transfer Goal: Bed to Chair/Chair to Bed - Progress: Goal set today  Visit Information  Last PT Received On: 10/07/12 Assistance Needed: +2 PT/OT Co-Evaluation/Treatment: Yes    Subjective Data      Prior Functioning  Home Living Lives With: Alone Available Help at Discharge: Skilled Nursing Facility Type of Home:  Skilled Nursing Facility Additional Comments: Pt is from [facility].  Reports she was in an independent living apartment until December  26 when [facility]. Prior Function Level of Independence: Independent with assistive device(s) Comments: Pt was independent until later december.  Reports that since she has been in skilled nursing she has needed assist with transfers and ADLs.  Communication Communication: No difficulties Dominant Hand: Right    Cognition  Overall Cognitive Status: Appears within functional limits for tasks assessed/performed Arousal/Alertness: Awake/alert Orientation Level: Appears intact for tasks assessed Behavior During Session: [facility] for tasks performed    Extremity/Trunk Assessment Right Upper Extremity Assessment RUE ROM/Strength/Tone: [facility] for tasks assessed Left Upper Extremity Assessment LUE ROM/Strength/Tone: WFL for tasks assessed Right Lower Extremity Assessment RLE ROM/Strength/Tone: Unable to fully assess;Due to pain;Deficits RLE ROM/Strength/Tone Deficits: R knee contracture in flexion Left Lower Extremity Assessment LLE ROM/Strength/Tone: Due to pain;Unable to fully assess;Deficits LLE ROM/Strength/Tone Deficits: grossly 4/5   Balance Balance Balance Assessed: Yes Static Sitting Balance Static Sitting - Balance Support: Bilateral upper extremity supported;Feet supported Static Sitting - Level of Assistance: 4: Min assist Static Sitting - Comment/# of Minutes: Min assist intermittently due to posterior lean  End of Session PT - End of Session Equipment Utilized During Treatment: Gait belt Activity Tolerance: Treatment limited secondary to medical complications (Comment) (increased bleeding and RLE pain) Patient left: in bed;with call bell/phone within reach Nurse Communication: Mobility status  GP     Milana Kidney 10/07/2012, 9:26 AM  10/07/2012 Milana Kidney DPT PAGER: [phone] OFFICE: [phone]

## 2012-10-07 NOTE — Progress Notes (Signed)
[facility] PROGRESS NOTE  Sara [name] NWG:[id number] DOB: 10-19-1918 DOA: 10/05/2012 PCP: Londell Moh, MD  Assessment/Plan: Syncope Likely secondary to symptomatic hypotension. No events on telemetry. MRI brain negative for acute infarct. 2D Echo and carotid dopplers done with results as indicated below. Neurology signed off. EEG on 10/06/2012 normal EEG. Continue aspirin and Plavix per home regimen.  Hypotension Likely secondary to dehydration and polypharmacy. Blood pressures improved after 500 mL normal saline bolus and actually in the hypertensive range now.  Continue to hold spironolactone. Start lower dose of lisinopril. Titrate blood pressure medications as tolerated.  Dehydration Resolved with IV hydration.   Chronic right leg wounds with cellulitis Continue wound care. Discussed with Dr. Lajoyce Corners, ortho. Continue IV vancomycin and aztreonam per pharmacy.  Dr. Lajoyce Corners will discuss with the patient on 10/09/2012 for possible amputation.  Cutaneous T-cell lymphoma/mycosis fungoides Management per Dr. Cyndie Chime, on low-dose Targretin 75 mg daily as outpatient.  Right thigh pain Unclear etiology. X-rays of pelvis, right femur and knee negative. Continue pain medications.   Chronic diastolic heart failure Compensated. Continue diuretics, except spironolactone.  Continue to monitor.  Anemia Likely due to chronic disease.  Hypertension Management as above.  Paroxysmal A. Fib Currently in sinus rhythm. Continue aspirin and Plavix.  CAD On Aspirin and plavix.  Leukocytosis Likely due to cellulitis and lymphoma.  Code Status: DNR/DNI Family Communication: Daughter at bedside. Disposition Plan: Pending.  Consultants:  Neurology  Ortho, Dr. Lajoyce Corners  Wound care  Procedures: EEG 10/06/2012: Normal EEG.  2D Echo on 10/06/2012 Study Conclusions - Left ventricle: The cavity size was normal. Wall thickness was normal. Systolic function was normal. The estimated  ejection fraction was in the range of 60% to 65%. Wall motion was normal; there were no regional wall motion abnormalities. Features are consistent with a pseudonormal left ventricular filling pattern, with concomitant abnormal relaxation and increased filling pressure (grade 2 diastolic dysfunction). Doppler parameters are consistent with elevated ventricular end-diastolic filling pressure. - Mitral valve: Moderate severe mitral annular and moderate submitral calcification Mild regurgitation. - Right ventricle: Systolic pressure was increased. - Atrial septum: No defect or patent foramen ovale was identified. - Pulmonary arteries: PA peak pressure: 39mm Hg (S). Impressions: - The right ventricular systolic pressure was increased consistent with mild pulmonary hypertension.  Carotid Dopplers on 10/06/2012 Bilateral: No evidence of hemodynamically significant internal carotid artery stenosis. Left ICA stent within normal limits. Vertebral artery flow is antegrade.   Antibiotics:  Vancomycin 10/05/2012 >>  Aztreonam 10/05/2012 >>  HPI/Subjective: No specific concerns.  Still having pain in her right leg.  Objective: Filed Vitals:   10/07/12 0200 10/07/12 0500 10/07/12 0600 10/07/12 0940  BP: 142/54 130/58 135/58 120/62  Pulse: 71 83 71 83  Temp: 97.9 F (36.6 C) 98.9 F (37.2 C) 98.1 F (36.7 C) 99.6 F (37.6 C)  TempSrc: Oral Oral Oral Axillary  Resp: 18 18 18 18   Height:      Weight:      SpO2: 97% 92% 96% 94%    Intake/Output Summary (Last 24 hours) at 10/07/12 1356 Last data filed at 10/07/12 0200  Gross per 24 hour  Intake 2513.75 ml  Output    350 ml  Net 2163.75 ml   Filed Weights   10/05/12 2036  Weight: 65.2 kg (143 lb 11.8 oz)    Exam: Physical Exam: General: Awake, Oriented, No acute distress. HEENT: EOMI. Neck: Supple CV: S1 and S2 Lungs: Clear to ascultation bilaterally Abdomen: Soft, Nontender, Nondistended, +bowel sounds.  Ext: Good pulses. Trace  edema. Multiple ulcerated lesion on legs bilaterally, most prominent on right medial ankle, slightly worse compared to yesterday.  Data Reviewed: Basic Metabolic Panel:  Lab 10/06/12 3244 10/05/12 1544  NA 138 137  K 4.5 4.4  CL 103 99  CO2 25 25  GLUCOSE 106* 111*  BUN 20 31*  CREATININE 0.62 1.00  CALCIUM 8.8 9.2  MG -- --  PHOS -- --   Liver Function Tests:  Lab 10/05/12 1544  AST 23  ALT 25  ALKPHOS 57  BILITOT 0.1*  PROT 6.5  ALBUMIN 2.7*   No results found for this basename: LIPASE:5,AMYLASE:5 in the last 168 hours No results found for this basename: AMMONIA:5 in the last 168 hours CBC:  Lab 10/06/12 0648 10/05/12 1544  WBC 23.4* 25.1*  NEUTROABS -- 9.8*  HGB 9.3* 9.9*  HCT 28.2* 30.5*  MCV 85.5 85.9  PLT 351 359   Cardiac Enzymes:  Lab 10/05/12 1545  CKTOTAL --  CKMB --  CKMBINDEX --  TROPONINI <0.30   BNP (last 3 results)  Basename 10/05/12 1545 03/24/12 0400 03/22/12 0358  PROBNP 3200.0* 2150.0* 2193.0*   CBG:  Lab 10/05/12 1428  GLUCAP 136*    Recent Results (from the past 240 hour(s))  CULTURE, BLOOD (ROUTINE X 2)     Status: Normal (Preliminary result)   Collection Time   10/05/12  3:45 PM      Component Value Range Status Comment   Specimen Description BLOOD ARM RIGHT   Final    Special Requests BOTTLES DRAWN AEROBIC AND ANAEROBIC 10CC   Final    Culture  Setup Time 10/05/2012 23:37   Final    Culture     Final    Value:        BLOOD CULTURE RECEIVED NO GROWTH TO DATE CULTURE WILL BE HELD FOR 5 DAYS BEFORE ISSUING A FINAL NEGATIVE REPORT   Report Status PENDING   Incomplete   CULTURE, BLOOD (ROUTINE X 2)     Status: Normal (Preliminary result)   Collection Time   10/05/12  4:00 PM      Component Value Range Status Comment   Specimen Description BLOOD HAND RIGHT   Final    Special Requests BOTTLES DRAWN AEROBIC ONLY 10CC   Final    Culture  Setup Time 10/05/2012 23:37   Final    Culture     Final    Value:        BLOOD CULTURE  RECEIVED NO GROWTH TO DATE CULTURE WILL BE HELD FOR 5 DAYS BEFORE ISSUING A FINAL NEGATIVE REPORT   Report Status PENDING   Incomplete   URINE CULTURE     Status: Normal   Collection Time   10/05/12  6:25 PM      Component Value Range Status Comment   Specimen Description URINE, RANDOM   Final    Special Requests NONE   Final    Culture  Setup Time 10/05/2012 19:34   Final    Colony Count NO GROWTH   Final    Culture NO GROWTH   Final    Report Status 10/07/2012 FINAL   Final      Studies: Dg Chest 1 View  10/05/2012  *RADIOLOGY REPORT*  Clinical Data: Leg ulceration.  CHEST - 1 VIEW  Comparison: PA and lateral chest 08/01/2012 and CT chest 06/02/2012.  Findings: Lungs are clear.  Heart size normal.  No pneumothorax or pleural fluid.  Remote fracture surgical neck  left humerus is identified.  The patient is status post lower thoracic vertebroplasty.  IMPRESSION: No acute finding.   Original Report Authenticated By: Holley Dexter, M.D.    Dg Pelvis 1-2 Views  10/05/2012  *RADIOLOGY REPORT*  Clinical Data: Right thigh pain.  PELVIS - 1-2 VIEW  Comparison: Pelvic CT 06/02/2012  Findings: Single view of the pelvis was obtained.  The pelvic bony ring is intact.  There are vascular calcifications.  No gross abnormality to either hip.  Nonspecific bowel gas pattern.  Stool in the rectal region.  IMPRESSION: No acute bony abnormality.   Original Report Authenticated By: Richarda Overlie, M.D.    Dg Femur Right  10/05/2012  *RADIOLOGY REPORT*  Clinical Data: Right thigh pain.  RIGHT FEMUR - 2 VIEW  Comparison: Pelvis 10/05/2012  Findings: Two views of the right femur were obtained.  No evidence for acute fracture.  The right hip is located.  There are vascular calcifications.  Limited evaluation of the knee joint.  IMPRESSION: No evidence for acute fracture.   Original Report Authenticated By: Richarda Overlie, M.D.    Dg Knee 1-2 Views Right  10/05/2012  *RADIOLOGY REPORT*  Clinical Data: Right thigh pain.   Rule out fracture.  RIGHT KNEE - 1-2 VIEW  Comparison: Right femur 10/05/2012  Findings: Two views of the knee were obtained.  The knee is located without acute fracture.  There are enthesopathic changes involving the patella.  Limited evaluation for joint space narrowing.  IMPRESSION: No acute bony abnormality.   Original Report Authenticated By: Richarda Overlie, M.D.    Dg Tibia/fibula Right  10/05/2012  *RADIOLOGY REPORT*  Clinical Data: Large ulceration over the Achilles tendon.  RIGHT TIBIA AND FIBULA - 2 VIEW  Comparison: Plain films right ankle 12/07/2011 and plain films right knee 01/14/2011.  Findings: Skin ulceration over the Achilles tendon is identified. No underlying soft tissue gas collection, radiopaque foreign body or bony destructive change is noted.  Bones are osteopenic.  There is no fracture.  Degenerative disease of the midfoot is noted.  IMPRESSION: Large skin ulceration over the Achilles without plain film evidence of osteomyelitis.   Original Report Authenticated By: Holley Dexter, M.D.    Dg Ankle Complete Right  10/05/2012  *RADIOLOGY REPORT*  Clinical Data: Large skin ulceration over the Achilles.  RIGHT ANKLE - COMPLETE 3+ VIEW  Comparison: MRI of the right ankle 01/09/2012 and plain films of the right foot 07/23/2012.  Findings: Large skin ulceration over the Achilles is identified. No underlying radiopaque foreign body, soft tissue gas collection or bony destructive change is noted.  Bones appear osteopenic. Midfoot degenerative disease of the right foot is noted.  IMPRESSION: Large skin ulceration without plain film evidence of osteomyelitis.   Original Report Authenticated By: Holley Dexter, M.D.    Ct Head Wo Contrast  10/05/2012  *RADIOLOGY REPORT*  Clinical Data: Episode of unresponsiveness for 2-3 minutes, history hypertension, coronary artery disease post MI, CHF  CT HEAD WITHOUT CONTRAST  Technique:  Contiguous axial images were obtained from the base of the skull  through the vertex without contrast.  Comparison: 12/07/2011  Findings: Generalized atrophy. Normal ventricular morphology. No midline shift or mass effect. Small vessel chronic ischemic changes of deep cerebral white matter. No intracranial hemorrhage or mass lesion. area of low attenuation identified at left middle cerebral peduncle could be related to white matter disease though a developing infarct is not excluded. Tiny old right thalamic and right basal ganglia lacunar infarcts. Scattered streak artifacts from  skull base at brain stem and cerebellum. Atherosclerotic calcifications of internal carotid arteries at skull base. Visualized paranasal sinuses and mastoid air cells clear. No acute osseous findings.  IMPRESSION: Atrophy with extensive small vessel chronic ischemic changes of deep cerebral white matter. Tiny old right thalamic and right basal ganglia lacunar infarcts. Question white matter disease changes at the left middle cerebral peduncle though cannot completely exclude infarct.   Original Report Authenticated By: Ulyses Southward, M.D.    Mr Laqueta Jean Wo Contrast  10/05/2012  *RADIOLOGY REPORT*  Clinical Data: Syncope with right leg hemiparesis.  Atrial fibrillation  MRI HEAD WITHOUT AND WITH CONTRAST  Technique:  Multiplanar, multiecho pulse sequences of the brain and surrounding structures were obtained according to standard protocol without and with intravenous contrast  Contrast: 12mL MULTIHANCE GADOBENATE DIMEGLUMINE 529 MG/ML IV SOLN  Comparison: CT head 10/05/2012  Findings: Negative for acute infarct.  Chronic microvascular ischemic changes have progressed since the prior MRI of 12/08/2011.  There is diffuse chronic ischemia throughout the cerebral white matter.  There is chronic ischemia in the thalami and basal ganglia,  mid brain and pons bilaterally.  CT abnormality in the left mid brain is chronic.  Negative for intracranial hemorrhage.  No mass or edema.  Postcontrast imaging of the brain  reveals normal enhancement.  No enhancing mass lesion is present.  Mild mucosal thickening right maxillary sinus.  IMPRESSION: No acute infarct.  Advanced chronic microvascular ischemia, with progression from 12/08/2011.   Original Report Authenticated By: Janeece Riggers, M.D.     Scheduled Meds:    . aspirin  81 mg Oral Daily  . aztreonam  1 g Intravenous Q8H  . bexarotene  75 mg Oral QAC supper  . carvedilol  12.5 mg Oral BID WC  . clopidogrel  75 mg Oral Daily  . collagenase   Topical Daily  . diltiazem  120 mg Oral Daily  . docusate sodium  100 mg Oral BID  . enoxaparin (LOVENOX) injection  40 mg Subcutaneous Q24H  . ferrous sulfate  325 mg Oral BID  . folic acid  1 mg Oral Daily  . furosemide  40 mg Oral q morning - 10a  . hydrocortisone cream  1 application Topical BID  . hydrOXYzine  10 mg Oral QHS  . lisinopril  5 mg Oral Daily  . pantoprazole  40 mg Oral Daily  . polyethylene glycol  17 g Oral Daily  . protein supplement  2 scoop Oral BID  . senna  1 tablet Oral QHS  . vancomycin  750 mg Intravenous Q24H   Continuous Infusions:   Principal Problem:  *Syncope Active Problems:  Leukocytosis  Chronic diastolic HF (heart failure)  Anemia  Cellulitis  Wound of right leg  TIA (transient ischemic attack)  HTN (hypertension)  PAF (paroxysmal atrial fibrillation)  Right thigh pain  Dehydration  Hypotension    Aniella Wandrey A, MD  [facility] Pager [phone]. If 7PM-7AM, please contact night-coverage at [email or web address], password [facility] 10/07/2012, 1:56 PM  LOS: 2 days

## 2012-10-08 LAB — CBC
Hemoglobin: 8.3 g/dL — ABNORMAL LOW (ref 12.0–15.0)
MCH: 28.1 pg (ref 26.0–34.0)
RBC: 2.95 MIL/uL — ABNORMAL LOW (ref 3.87–5.11)

## 2012-10-08 LAB — BASIC METABOLIC PANEL
CO2: 26 mEq/L (ref 19–32)
Glucose, Bld: 106 mg/dL — ABNORMAL HIGH (ref 70–99)
Potassium: 4.3 mEq/L (ref 3.5–5.1)
Sodium: 131 mEq/L — ABNORMAL LOW (ref 135–145)

## 2012-10-08 MED ORDER — HYDROMORPHONE HCL PF 1 MG/ML IJ SOLN
1.0000 mg | INTRAMUSCULAR | Status: DC | PRN
  Administered 2012-10-08: 2 mg via INTRAVENOUS
  Administered 2012-10-09: 1 mg via INTRAVENOUS
  Administered 2012-10-09 – 2012-10-10 (×2): 2 mg via INTRAVENOUS
  Administered 2012-10-10 – 2012-10-11 (×3): 1 mg via INTRAVENOUS
  Filled 2012-10-08 (×3): qty 1
  Filled 2012-10-08 (×2): qty 2
  Filled 2012-10-08 (×2): qty 1
  Filled 2012-10-08: qty 2

## 2012-10-08 NOTE — Progress Notes (Signed)
ANTIBIOTIC CONSULT NOTE - Follow-Up  Pharmacy Consult for Vancomycin and Aztreonam Indication: RLE wounds, cellulitis  Allergies  Allergen Reactions  . Penicillins Other (See Comments)    unknown  . Triamterene Other (See Comments)    unknown  . Alendronate Sodium Rash    Patient Measurements: Height: 5\' 7"  (170.2 cm) Weight: 143 lb 11.8 oz (65.2 kg) IBW/kg (Calculated) : 61.6   Vital Signs: Temp: 98.3 F (36.8 C) (01/26 1455) Temp src: Oral (01/26 1455) BP: 110/62 mmHg (01/26 1455) Pulse Rate: 69  (01/26 1455) Intake/Output from previous day: 01/25 0701 - 01/26 0700 In: 960 [P.O.:960] Out: 1201 [Urine:1200; Stool:1] Intake/Output from this shift: Total I/O In: 360 [P.O.:360] Out: -   Labs:  Basename 10/08/12 0610 10/06/12 0648  WBC 22.2* 23.4*  HGB 8.3* 9.3*  PLT 361 351  LABCREA -- --  CREATININE 0.66 0.62   Estimated Creatinine Clearance: 44.5 ml/min (by C-G formula based on Cr of 0.66). No results found for this basename: VANCOTROUGH:2,VANCOPEAK:2,VANCORANDOM:2,GENTTROUGH:2,GENTPEAK:2,GENTRANDOM:2,TOBRATROUGH:2,TOBRAPEAK:2,TOBRARND:2,AMIKACINPEAK:2,AMIKACINTROU:2,AMIKACIN:2, in the last 72 hours   Microbiology: Recent Results (from the past 720 hour(s))  CULTURE, BLOOD (ROUTINE X 2)     Status: Normal (Preliminary result)   Collection Time   10/05/12  3:45 PM      Component Value Range Status Comment   Specimen Description BLOOD ARM RIGHT   Final    Special Requests BOTTLES DRAWN AEROBIC AND ANAEROBIC 10CC   Final    Culture  Setup Time 10/05/2012 23:37   Final    Culture     Final    Value:        BLOOD CULTURE RECEIVED NO GROWTH TO DATE CULTURE WILL BE HELD FOR 5 DAYS BEFORE ISSUING A FINAL NEGATIVE REPORT   Report Status PENDING   Incomplete   CULTURE, BLOOD (ROUTINE X 2)     Status: Normal (Preliminary result)   Collection Time   10/05/12  4:00 PM      Component Value Range Status Comment   Specimen Description BLOOD HAND RIGHT   Final    Special  Requests BOTTLES DRAWN AEROBIC ONLY 10CC   Final    Culture  Setup Time 10/05/2012 23:37   Final    Culture     Final    Value:        BLOOD CULTURE RECEIVED NO GROWTH TO DATE CULTURE WILL BE HELD FOR 5 DAYS BEFORE ISSUING A FINAL NEGATIVE REPORT   Report Status PENDING   Incomplete   URINE CULTURE     Status: Normal   Collection Time   10/05/12  6:25 PM      Component Value Range Status Comment   Specimen Description URINE, RANDOM   Final    Special Requests NONE   Final    Culture  Setup Time 10/05/2012 19:34   Final    Colony Count NO GROWTH   Final    Culture NO GROWTH   Final    Report Status 10/07/2012 FINAL   Final    Assessment: 79 y.o. F who continues on Vancomycin + Aztreonam for empiric RLE cellultis/wound coverage (no evidence of osteo seen). Possible plans for amputation are being discussed on Mon, 1/27. Renal function is stable -- doses remain appropriate at this time.   Goal of Therapy:  Vancomycin trough level 10-15 mcg/ml  Plan:  1. Continue Vancomycin 750 mg IV every 24 hours 2. Continue Aztreonam 1g IV every 8 hours 3. Will continue to follow renal function, culture results, LOT, and antibiotic  de-escalation plans   Georgina Pillion, PharmD, BCPS Clinical Pharmacist Pager: [phone] 10/08/2012 4:56 PM

## 2012-10-08 NOTE — Progress Notes (Signed)
Some  Discoloration and swelling noted on Pt's groin area during ADL this evening. Pt.stated it was not there previously and Pt's daughter present.  MD notified. RN will continue to monitor patient status.

## 2012-10-08 NOTE — Progress Notes (Signed)
[facility] PROGRESS NOTE  [name] GNF:[id number] DOB: 1918-12-27 DOA: 10/05/2012 PCP: Londell Moh, MD Brief Narrative: Sara [name] is a 79 y.o. female , resident of [facility], PMH of cutaneous T-cell lymphoma/mycosis fungoidis, psoriasis, nonhealing wound right lateral malleolus, chronic A. fib, hypertension, CAD, chronic diastolic CHF, prior CVAs, HL presented to ED on 1/23 with an episode of passing out on the afternoon of 10/05/2012. Patient is unable to provide details of the event. These are provided by patient's daughter, [name] who is at bedside. Since yesterday patient apparently has been complaining of progressive pain in her right groin/thigh, unable to straighten leg out, inability to stand. She had extensive x-rays of right lower extremity and pelvis which were apparently negative for fractures or acute events. Patient saw her in to visit her this afternoon. At approximately 12:30 PM, patient was at lunch room. She however was not answering appropriately and indicated that she was not feeling well. Daughter took her back to her room on her wheelchair. Patient was trying to eat ice cream in the room when she became unresponsive, leaning to the right with ice cream drooling from right side of her mouth. No preceding or subsequent symptoms of chest pain, palpitations or headaches. Exact duration of unresponsiveness unclear-daughter indicates 10-15 minutes. No seizure-like activity or urinary incontinence.   Assessment/Plan: Chronic right leg wounds with cellulitis Continue wound care. Discussed with Dr. Lajoyce Corners, ortho. Continue IV vancomycin and aztreonam per pharmacy.  Dr. Lajoyce Corners will discuss with the patient on 10/09/2012 for possible amputation.  Syncope Likely secondary to symptomatic hypotension. No events on telemetry. MRI brain negative for acute infarct. 2D Echo and carotid dopplers done with results as indicated below. Neurology signed off. EEG on 10/06/2012  normal EEG. Continue aspirin and Plavix per home regimen.  Hypotension Likely secondary to dehydration and polypharmacy. Blood pressures improved after 500 mL normal saline bolus and actually in the hypertensive range now.  Continue to hold spironolactone. On lower dose of lisinopril. Titrate blood pressure medications as tolerated. Blood pressure stable.  Dehydration Resolved with IV hydration.   Cutaneous T-cell lymphoma/mycosis fungoides Management per Dr. Cyndie Chime, on low-dose Targretin 75 mg daily as outpatient.  Right thigh pain Unclear etiology. X-rays of pelvis, right femur and knee negative. Continue pain medications.   Chronic diastolic heart failure Compensated. Continue diuretics, except spironolactone.  Continue to monitor.  Anemia Likely due to chronic disease.  Hypertension Management as above.  Paroxysmal A. Fib Currently in sinus rhythm. Continue aspirin and Plavix.  CAD On Aspirin and plavix.  Leukocytosis Likely due to cellulitis and lymphoma.  Code Status: DNR/DNI Family Communication: Daughter at bedside. Disposition Plan: Pending.  Consultants:  Neurology  Ortho, Dr. Lajoyce Corners  Wound care  Procedures: EEG 10/06/2012: Normal EEG.  2D Echo on 10/06/2012 Study Conclusions - Left ventricle: The cavity size was normal. Wall thickness was normal. Systolic function was normal. The estimated ejection fraction was in the range of 60% to 65%. Wall motion was normal; there were no regional wall motion abnormalities. Features are consistent with a pseudonormal left ventricular filling pattern, with concomitant abnormal relaxation and increased filling pressure (grade 2 diastolic dysfunction). Doppler parameters are consistent with elevated ventricular end-diastolic filling pressure. - Mitral valve: Moderate severe mitral annular and moderate submitral calcification Mild regurgitation. - Right ventricle: Systolic pressure was increased. - Atrial septum: No  defect or patent foramen ovale was identified. - Pulmonary arteries: PA peak pressure: 39mm Hg (S). Impressions: - The right ventricular  systolic pressure was increased consistent with mild pulmonary hypertension.  Carotid Dopplers on 10/06/2012 Bilateral: No evidence of hemodynamically significant internal carotid artery stenosis. Left ICA stent within normal limits. Vertebral artery flow is antegrade.   Antibiotics:  Vancomycin 10/05/2012 >>  Aztreonam 10/05/2012 >>  HPI/Subjective: No complaints except pain in right leg.  Objective: Filed Vitals:   10/07/12 1816 10/07/12 2149 10/08/12 0211 10/08/12 0706  BP: 110/60 110/60 125/57 130/56  Pulse: 70 70 74 79  Temp: 98.8 F (37.1 C) 98.8 F (37.1 C) 98.7 F (37.1 C) 98.7 F (37.1 C)  TempSrc: Oral Oral Oral Oral  Resp: 18 18  18   Height:      Weight:      SpO2: 97% 93% 95% 93%    Intake/Output Summary (Last 24 hours) at 10/08/12 1113 Last data filed at 10/07/12 1700  Gross per 24 hour  Intake    720 ml  Output    800 ml  Net    -80 ml   Filed Weights   10/05/12 2036  Weight: 65.2 kg (143 lb 11.8 oz)    Exam: Physical Exam: General: Awake, Oriented, No acute distress. HEENT: EOMI. Neck: Supple CV: S1 and S2 Lungs: Clear to ascultation bilaterally Abdomen: Soft, Nontender, Nondistended, +bowel sounds. Ext: Good pulses. Trace edema. Multiple ulcerated lesion on legs bilaterally, most prominent on right medial ankle, about the same as yesterday.  Data Reviewed: Basic Metabolic Panel:  Lab 10/08/12 4540 10/06/12 0648 10/05/12 1544  NA 131* 138 137  K 4.3 4.5 4.4  CL 96 103 99  CO2 26 25 25   GLUCOSE 106* 106* 111*  BUN 20 20 31*  CREATININE 0.66 0.62 1.00  CALCIUM 9.0 8.8 9.2  MG -- -- --  PHOS -- -- --   Liver Function Tests:  Lab 10/05/12 1544  AST 23  ALT 25  ALKPHOS 57  BILITOT 0.1*  PROT 6.5  ALBUMIN 2.7*   No results found for this basename: LIPASE:5,AMYLASE:5 in the last 168 hours No  results found for this basename: AMMONIA:5 in the last 168 hours CBC:  Lab 10/08/12 0610 10/06/12 0648 10/05/12 1544  WBC 22.2* 23.4* 25.1*  NEUTROABS -- -- 9.8*  HGB 8.3* 9.3* 9.9*  HCT 25.1* 28.2* 30.5*  MCV 85.1 85.5 85.9  PLT 361 351 359   Cardiac Enzymes:  Lab 10/05/12 1545  CKTOTAL --  CKMB --  CKMBINDEX --  TROPONINI <0.30   BNP (last 3 results)  Basename 10/05/12 1545 03/24/12 0400 03/22/12 0358  PROBNP 3200.0* 2150.0* 2193.0*   CBG:  Lab 10/08/12 0701 10/05/12 1428  GLUCAP 117* 136*    Recent Results (from the past 240 hour(s))  CULTURE, BLOOD (ROUTINE X 2)     Status: Normal (Preliminary result)   Collection Time   10/05/12  3:45 PM      Component Value Range Status Comment   Specimen Description BLOOD ARM RIGHT   Final    Special Requests BOTTLES DRAWN AEROBIC AND ANAEROBIC 10CC   Final    Culture  Setup Time 10/05/2012 23:37   Final    Culture     Final    Value:        BLOOD CULTURE RECEIVED NO GROWTH TO DATE CULTURE WILL BE HELD FOR 5 DAYS BEFORE ISSUING A FINAL NEGATIVE REPORT   Report Status PENDING   Incomplete   CULTURE, BLOOD (ROUTINE X 2)     Status: Normal (Preliminary result)   Collection Time   10/05/12  4:00 PM      Component Value Range Status Comment   Specimen Description BLOOD HAND RIGHT   Final    Special Requests BOTTLES DRAWN AEROBIC ONLY 10CC   Final    Culture  Setup Time 10/05/2012 23:37   Final    Culture     Final    Value:        BLOOD CULTURE RECEIVED NO GROWTH TO DATE CULTURE WILL BE HELD FOR 5 DAYS BEFORE ISSUING A FINAL NEGATIVE REPORT   Report Status PENDING   Incomplete   URINE CULTURE     Status: Normal   Collection Time   10/05/12  6:25 PM      Component Value Range Status Comment   Specimen Description URINE, RANDOM   Final    Special Requests NONE   Final    Culture  Setup Time 10/05/2012 19:34   Final    Colony Count NO GROWTH   Final    Culture NO GROWTH   Final    Report Status 10/07/2012 FINAL   Final       Studies: No results found.  Scheduled Meds:    . aspirin  81 mg Oral Daily  . aztreonam  1 g Intravenous Q8H  . bexarotene  75 mg Oral QAC supper  . carvedilol  12.5 mg Oral BID WC  . clopidogrel  75 mg Oral Daily  . collagenase   Topical Daily  . diltiazem  120 mg Oral Daily  . docusate sodium  100 mg Oral BID  . enoxaparin (LOVENOX) injection  40 mg Subcutaneous Q24H  . ferrous sulfate  325 mg Oral BID  . folic acid  1 mg Oral Daily  . furosemide  40 mg Oral q morning - 10a  . hydrocortisone cream  1 application Topical BID  . hydrOXYzine  10 mg Oral QHS  . lisinopril  5 mg Oral Daily  . pantoprazole  40 mg Oral Daily  . polyethylene glycol  17 g Oral Daily  . protein supplement  2 scoop Oral BID  . senna  1 tablet Oral QHS  . vancomycin  750 mg Intravenous Q24H   Continuous Infusions:   Principal Problem:  *Syncope Active Problems:  Leukocytosis  Chronic diastolic HF (heart failure)  Anemia  Cellulitis  Wound of right leg  TIA (transient ischemic attack)  HTN (hypertension)  PAF (paroxysmal atrial fibrillation)  Right thigh pain  Dehydration  Hypotension    Rashel Okeefe A, MD  [facility] Pager [phone]. If 7PM-7AM, please contact night-coverage at [email or web address], password [facility] 10/08/2012, 11:13 AM  LOS: 3 days

## 2012-10-09 DIAGNOSIS — I10 Essential (primary) hypertension: Secondary | ICD-10-CM

## 2012-10-09 LAB — BASIC METABOLIC PANEL
BUN: 22 mg/dL (ref 6–23)
CO2: 25 mEq/L (ref 19–32)
Chloride: 95 mEq/L — ABNORMAL LOW (ref 96–112)
Creatinine, Ser: 0.64 mg/dL (ref 0.50–1.10)

## 2012-10-09 LAB — CBC
HCT: 26.7 % — ABNORMAL LOW (ref 36.0–46.0)
Hemoglobin: 8.6 g/dL — ABNORMAL LOW (ref 12.0–15.0)
MCV: 85.3 fL (ref 78.0–100.0)
RBC: 3.13 MIL/uL — ABNORMAL LOW (ref 3.87–5.11)
WBC: 19.2 10*3/uL — ABNORMAL HIGH (ref 4.0–10.5)

## 2012-10-09 MED ORDER — ALBUTEROL SULFATE (5 MG/ML) 0.5% IN NEBU: 2.5000 mg | INHALATION_SOLUTION | Freq: Four times a day (QID) | RESPIRATORY_TRACT | Status: DC | PRN

## 2012-10-09 MED ORDER — CHLORHEXIDINE GLUCONATE 4 % EX LIQD
60.0000 mL | Freq: Once | CUTANEOUS | Status: AC
  Administered 2012-10-10: 4 via TOPICAL
  Filled 2012-10-09: qty 60

## 2012-10-09 NOTE — Progress Notes (Signed)
[facility] PROGRESS NOTE  Sara [name] WUJ:[id number] DOB: May 18, 1919 DOA: 10/05/2012 PCP: Londell Moh, MD Brief Narrative: Sara [name] is a 79 y.o. female , resident of [facility], PMH of cutaneous T-cell lymphoma/mycosis fungoidis, psoriasis, nonhealing wound right lateral malleolus, chronic A. fib, hypertension, CAD, chronic diastolic CHF, prior CVAs, HL presented to ED on 1/23 with an episode of passing out on the afternoon of 10/05/2012. Patient is unable to provide details of the event. These are provided by patient's daughter, Ms. [name] who is at bedside. Since yesterday patient apparently has been complaining of progressive pain in her right groin/thigh, unable to straighten leg out, inability to stand. She had extensive x-rays of right lower extremity and pelvis which were apparently negative for fractures or acute events. Patient saw her in to visit her this afternoon. At approximately 12:30 PM, patient was at lunch room. She however was not answering appropriately and indicated that she was not feeling well. Daughter took her back to her room on her wheelchair. Patient was trying to eat ice cream in the room when she became unresponsive, leaning to the right with ice cream drooling from right side of her mouth. No preceding or subsequent symptoms of chest pain, palpitations or headaches. Exact duration of unresponsiveness unclear-daughter indicates 10-15 minutes. No seizure-like activity or urinary incontinence.   Assessment/Plan: Chronic right leg wounds with cellulitis Continue wound care. Discussed with Dr. Lajoyce Corners, ortho. Continue IV vancomycin and aztreonam per pharmacy.  Dr. Lajoyce Corners will discuss with the patient today for possible amputation.  Syncope Likely secondary to symptomatic hypotension. No events on telemetry. MRI brain negative for acute infarct. 2D Echo and carotid dopplers done with results as indicated below. Neurology signed off. EEG on 10/06/2012 normal  EEG. Continue aspirin and Plavix per home regimen.  Hypotension Likely secondary to dehydration and polypharmacy. Blood pressures improved after 500 mL normal saline bolus and actually in the hypertensive range now.  Continue to hold spironolactone. On lower dose of lisinopril. Titrate blood pressure medications as tolerated. Blood pressure stable.  Dehydration Resolved with IV hydration.  Fluids KVO.  Cutaneous T-cell lymphoma/mycosis fungoides Management per Dr. Cyndie Chime, on low-dose Targretin 75 mg daily as outpatient.  Right thigh pain Likely due to positioning from her chronic right lower extremity numbness. X-rays of pelvis, right femur and knee negative. Continue pain medications.   Chronic diastolic heart failure Compensated. Continue diuretics, except spironolactone.  Continue to monitor.  Anemia Likely due to chronic disease.  Hypertension Management as above.  Paroxysmal A. Fib Currently in sinus rhythm. Continue aspirin and Plavix.  CAD On Aspirin and plavix.  Leukocytosis Likely due to cellulitis and lymphoma.  Code Status: DNR/DNI Family Communication: Daughter at bedside. Disposition Plan: Pending.  Consultants:  Neurology  Ortho, Dr. Lajoyce Corners  Wound care  Procedures: EEG 10/06/2012: Normal EEG.  2D Echo on 10/06/2012 Study Conclusions - Left ventricle: The cavity size was normal. Wall thickness was normal. Systolic function was normal. The estimated ejection fraction was in the range of 60% to 65%. Wall motion was normal; there were no regional wall motion abnormalities. Features are consistent with a pseudonormal left ventricular filling pattern, with concomitant abnormal relaxation and increased filling pressure (grade 2 diastolic dysfunction). Doppler parameters are consistent with elevated ventricular end-diastolic filling pressure. - Mitral valve: Moderate severe mitral annular and moderate submitral calcification Mild regurgitation. - Right  ventricle: Systolic pressure was increased. - Atrial septum: No defect or patent foramen ovale was identified. - Pulmonary arteries: PA  peak pressure: 39mm Hg (S). Impressions: - The right ventricular systolic pressure was increased consistent with mild pulmonary hypertension.  Carotid Dopplers on 10/06/2012 Bilateral: No evidence of hemodynamically significant internal carotid artery stenosis. Left ICA stent within normal limits. Vertebral artery flow is antegrade.   Antibiotics:  Vancomycin 10/05/2012 >>  Aztreonam 10/05/2012 >>  HPI/Subjective: No complaints except pain in right leg. Discoloration over the right medial thigh.  Objective: Filed Vitals:   10/08/12 2200 10/09/12 0200 10/09/12 0600 10/09/12 1029  BP: 114/46 102/46 116/44 130/58  Pulse: 79 74 73 70  Temp: 98.7 F (37.1 C) 97.8 F (36.6 C) 97.6 F (36.4 C) 98.3 F (36.8 C)  TempSrc: Oral Oral Oral Oral  Resp: 18 18 18 18   Height:      Weight:      SpO2: 100% 97% 97% 93%    Intake/Output Summary (Last 24 hours) at 10/09/12 1129 Last data filed at 10/09/12 1038  Gross per 24 hour  Intake   1120 ml  Output      0 ml  Net   1120 ml   Filed Weights   10/05/12 2036  Weight: 65.2 kg (143 lb 11.8 oz)    Exam: Physical Exam: General: Awake, Oriented, No acute distress. HEENT: EOMI. Neck: Supple CV: S1 and S2 Lungs: Clear to ascultation bilaterally Abdomen: Soft, Nontender, Nondistended, +bowel sounds. Ext: Good pulses. Trace edema. Multiple ulcerated lesion on legs bilaterally, most prominent on right medial ankle, unchanged.  Discoloration over the right upper medial thigh, not suggestive of cellulitis.  Data Reviewed: Basic Metabolic Panel:  Lab 10/09/12 1610 10/08/12 0610 10/06/12 0648 10/05/12 1544  NA 130* 131* 138 137  K 4.5 4.3 4.5 4.4  CL 95* 96 103 99  CO2 25 26 25 25   GLUCOSE 96 106* 106* 111*  BUN 22 20 20  31*  CREATININE 0.64 0.66 0.62 1.00  CALCIUM 9.1 9.0 8.8 9.2  MG -- -- -- --    PHOS -- -- -- --   Liver Function Tests:  Lab 10/05/12 1544  AST 23  ALT 25  ALKPHOS 57  BILITOT 0.1*  PROT 6.5  ALBUMIN 2.7*   No results found for this basename: LIPASE:5,AMYLASE:5 in the last 168 hours No results found for this basename: AMMONIA:5 in the last 168 hours CBC:  Lab 10/09/12 0500 10/08/12 0610 10/06/12 0648 10/05/12 1544  WBC 19.2* 22.2* 23.4* 25.1*  NEUTROABS -- -- -- 9.8*  HGB 8.6* 8.3* 9.3* 9.9*  HCT 26.7* 25.1* 28.2* 30.5*  MCV 85.3 85.1 85.5 85.9  PLT 375 361 351 359   Cardiac Enzymes:  Lab 10/05/12 1545  CKTOTAL --  CKMB --  CKMBINDEX --  TROPONINI <0.30   BNP (last 3 results)  Basename 10/05/12 1545 03/24/12 0400 03/22/12 0358  PROBNP 3200.0* 2150.0* 2193.0*   CBG:  Lab 10/08/12 0701 10/05/12 1428  GLUCAP 117* 136*    Recent Results (from the past 240 hour(s))  CULTURE, BLOOD (ROUTINE X 2)     Status: Normal (Preliminary result)   Collection Time   10/05/12  3:45 PM      Component Value Range Status Comment   Specimen Description BLOOD ARM RIGHT   Final    Special Requests BOTTLES DRAWN AEROBIC AND ANAEROBIC 10CC   Final    Culture  Setup Time 10/05/2012 23:37   Final    Culture     Final    Value:        BLOOD CULTURE RECEIVED NO GROWTH  TO DATE CULTURE WILL BE HELD FOR 5 DAYS BEFORE ISSUING A FINAL NEGATIVE REPORT   Report Status PENDING   Incomplete   CULTURE, BLOOD (ROUTINE X 2)     Status: Normal (Preliminary result)   Collection Time   10/05/12  4:00 PM      Component Value Range Status Comment   Specimen Description BLOOD HAND RIGHT   Final    Special Requests BOTTLES DRAWN AEROBIC ONLY 10CC   Final    Culture  Setup Time 10/05/2012 23:37   Final    Culture     Final    Value:        BLOOD CULTURE RECEIVED NO GROWTH TO DATE CULTURE WILL BE HELD FOR 5 DAYS BEFORE ISSUING A FINAL NEGATIVE REPORT   Report Status PENDING   Incomplete   URINE CULTURE     Status: Normal   Collection Time   10/05/12  6:25 PM      Component Value  Range Status Comment   Specimen Description URINE, RANDOM   Final    Special Requests NONE   Final    Culture  Setup Time 10/05/2012 19:34   Final    Colony Count NO GROWTH   Final    Culture NO GROWTH   Final    Report Status 10/07/2012 FINAL   Final      Studies: No results found.  Scheduled Meds:    . aspirin  81 mg Oral Daily  . aztreonam  1 g Intravenous Q8H  . bexarotene  75 mg Oral QAC supper  . carvedilol  12.5 mg Oral BID WC  . clopidogrel  75 mg Oral Daily  . collagenase   Topical Daily  . diltiazem  120 mg Oral Daily  . docusate sodium  100 mg Oral BID  . enoxaparin (LOVENOX) injection  40 mg Subcutaneous Q24H  . ferrous sulfate  325 mg Oral BID  . folic acid  1 mg Oral Daily  . furosemide  40 mg Oral q morning - 10a  . hydrocortisone cream  1 application Topical BID  . hydrOXYzine  10 mg Oral QHS  . lisinopril  5 mg Oral Daily  . pantoprazole  40 mg Oral Daily  . polyethylene glycol  17 g Oral Daily  . protein supplement  2 scoop Oral BID  . senna  1 tablet Oral QHS  . vancomycin  750 mg Intravenous Q24H   Continuous Infusions:   Principal Problem:  *Syncope Active Problems:  Leukocytosis  Chronic diastolic HF (heart failure)  Anemia  Cellulitis  Wound of right leg  TIA (transient ischemic attack)  HTN (hypertension)  PAF (paroxysmal atrial fibrillation)  Right thigh pain  Dehydration  Hypotension    Demeisha Geraghty A, MD  [facility] Pager [phone]. If 7PM-7AM, please contact night-coverage at [email or web address], password [facility] 10/09/2012, 11:29 AM  LOS: 4 days

## 2012-10-09 NOTE — Progress Notes (Signed)
Patient ID: Sara [name], female   DOB: 1919-04-21, 79 y.o.   MRN: [id number] Patient complains of worsening ischemic pain in her right lower extremity. She has gangrenous ulcers which have been treated with wound care with no signs of improvement. She does have some dermatitis involving her chest abdomen and hip area but this does not appear to be a local cellulitis.  Do to her persistent pain with ischemic changes the right lower extremity patient states she cannot go on with her current pain symptoms and would like to proceed with an above-the-knee amputation the right. I will set this up for her tomorrow as an add-on surgery most likely after 5 PM. Patient will need skilled nursing placement postoperatively.

## 2012-10-09 NOTE — Consult Note (Signed)
Wound care follow-up: Dr Lajoyce Corners now following for assessment and plan of care. Will not plan to follow further unless re-consulted.  [address], RN, MSN, [employer]  [phone]

## 2012-10-09 NOTE — Progress Notes (Signed)
Physical Therapy Treatment Patient Details Name: Sara [name] MRN: [id number] DOB: [birth year] Today's Date: 10/09/2012 Time: 4098-1191 PT Time Calculation (min): 28 min  PT Assessment / Plan / Recommendation Comments on Treatment Session  Pt's mobility limited by RLE pain & inability to WB on RLE.  Pt very pleasant & willing to participate in therapy session.      Follow Up Recommendations  SNF;Supervision/Assistance - 24 hour     Does the patient have the potential to tolerate intense rehabilitation     Barriers to Discharge        Equipment Recommendations  None recommended by PT    Recommendations for Other Services    Frequency Min 3X/week   Plan Discharge plan remains appropriate    Precautions / Restrictions Precautions Precautions: Fall;Other (comment) (bleeding) Precaution Comments: pt bleeds easily with any tear to the skin Restrictions Weight Bearing Restrictions: Yes RLE Weight Bearing: Non weight bearing   Pertinent Vitals/Pain R LE pain with movement.  Premedicated.      Mobility  Bed Mobility Bed Mobility: Supine to Sit;Sitting - Scoot to Edge of Bed Supine to Sit: 1: +2 Total assist;HOB elevated Supine to Sit: Patient Percentage: 50% Sitting - Scoot to Edge of Bed: 1: +2 Total assist Sitting - Scoot to Edge of Bed: Patient Percentage: 50% Details for Bed Mobility Assistance: (A) for RLE due to pain & knee contraction, (A) to lift shoulders/trunk to sitting upright, & use of draw pad to pivot hips around & scoot closer to EOB.  Cues for sequencing & technique.   Transfers Transfers: Sit to Stand;Stand to Sit;Stand Pivot Transfers Sit to Stand: With upper extremity assist;From bed;1: +2 Total assist Sit to Stand: Patient Percentage: 50% Stand to Sit: 3: Mod assist;With upper extremity assist;With armrests;To chair/3-in-1 Stand Pivot Transfers: 1: +2 Total assist Stand Pivot Transfers: Patient Percentage: 50% Details for Transfer Assistance: Used RW for  transfers.  Pt able to stand with increased upright posture & only able to pivot on Lt foot with (A) to rotate hips from bed>recliner.   Ambulation/Gait Ambulation/Gait Assistance: Not tested (comment)      PT Goals Acute Rehab PT Goals Time For Goal Achievement: 10/21/12 Potential to Achieve Goals: Fair Pt will go Supine/Side to Sit: with min assist PT Goal: Supine/Side to Sit - Progress: Not met Pt will go Sit to Supine/Side: with min assist Pt will go Sit to Stand: with min assist PT Goal: Sit to Stand - Progress: Not met Pt will go Stand to Sit: with min assist PT Goal: Stand to Sit - Progress: Not met Pt will Transfer Bed to Chair/Chair to Bed: with min assist PT Transfer Goal: Bed to Chair/Chair to Bed - Progress: Progressing toward goal  Visit Information  Last PT Received On: 10/09/12 Assistance Needed: +2    Subjective Data      Cognition  Overall Cognitive Status: Appears within functional limits for tasks assessed/performed Arousal/Alertness: Awake/alert Orientation Level: Appears intact for tasks assessed Behavior During Session: [facility] for tasks performed    Balance     End of Session PT - End of Session Equipment Utilized During Treatment: Gait belt Activity Tolerance: Patient limited by pain Patient left: in chair;with call bell/phone within reach Nurse Communication: Mobility status   Verdell Face, [place] [phone] 10/09/2012

## 2012-10-10 ENCOUNTER — Encounter (HOSPITAL_COMMUNITY)
Admission: EM | Disposition: A | Discharge status: Skilled Nursing Facility | Payer: Self-pay | Source: Home / Self Care | Attending: Internal Medicine

## 2012-10-10 ENCOUNTER — Inpatient Hospital Stay (HOSPITAL_COMMUNITY): Payer: Medicare Other | Admitting: Anesthesiology

## 2012-10-10 ENCOUNTER — Encounter (HOSPITAL_COMMUNITY): Payer: Self-pay | Admitting: Anesthesiology

## 2012-10-10 HISTORY — PX: AMPUTATION: SHX166

## 2012-10-10 LAB — CBC
HCT: 26 % — ABNORMAL LOW (ref 36.0–46.0)
Hemoglobin: 8.7 g/dL — ABNORMAL LOW (ref 12.0–15.0)
WBC: 20.6 10*3/uL — ABNORMAL HIGH (ref 4.0–10.5)

## 2012-10-10 LAB — HEMOGLOBIN AND HEMATOCRIT, BLOOD
HCT: 29.3 % — ABNORMAL LOW (ref 36.0–46.0)
Hemoglobin: 9.9 g/dL — ABNORMAL LOW (ref 12.0–15.0)

## 2012-10-10 LAB — SURGICAL PCR SCREEN: Staphylococcus aureus: POSITIVE — AB

## 2012-10-10 SURGERY — AMPUTATION, ABOVE KNEE
Anesthesia: General | Site: Leg Upper | Laterality: Right | Wound class: Clean

## 2012-10-10 MED ORDER — METOCLOPRAMIDE HCL 5 MG PO TABS
5.0000 mg | ORAL_TABLET | Freq: Three times a day (TID) | ORAL | Status: DC | PRN
  Filled 2012-10-10: qty 2

## 2012-10-10 MED ORDER — METOCLOPRAMIDE HCL 5 MG/ML IJ SOLN
10.0000 mg | Freq: Once | INTRAMUSCULAR | Status: AC | PRN
  Filled 2012-10-10: qty 2

## 2012-10-10 MED ORDER — ONDANSETRON HCL 4 MG/2ML IJ SOLN: 4.0000 mg | Freq: Four times a day (QID) | INTRAMUSCULAR | Status: DC | PRN

## 2012-10-10 MED ORDER — ONDANSETRON HCL 4 MG/2ML IJ SOLN
INTRAMUSCULAR | Status: DC | PRN
  Administered 2012-10-10: 4 mg via INTRAVENOUS

## 2012-10-10 MED ORDER — ARTIFICIAL TEARS OP OINT
TOPICAL_OINTMENT | OPHTHALMIC | Status: DC | PRN
  Administered 2012-10-10: 1 via OPHTHALMIC

## 2012-10-10 MED ORDER — LACTATED RINGERS IV SOLN
INTRAVENOUS | Status: DC | PRN
  Administered 2012-10-10: 17:00:00 via INTRAVENOUS

## 2012-10-10 MED ORDER — 0.9 % SODIUM CHLORIDE (POUR BTL) OPTIME
TOPICAL | Status: DC | PRN
  Administered 2012-10-10: 1000 mL

## 2012-10-10 MED ORDER — METOCLOPRAMIDE HCL 5 MG/ML IJ SOLN
5.0000 mg | Freq: Three times a day (TID) | INTRAMUSCULAR | Status: DC | PRN
  Filled 2012-10-10: qty 2

## 2012-10-10 MED ORDER — PROPOFOL 10 MG/ML IV BOLUS
INTRAVENOUS | Status: DC | PRN
  Administered 2012-10-10: 160 mg via INTRAVENOUS

## 2012-10-10 MED ORDER — MORPHINE SULFATE 2 MG/ML IJ SOLN: 1.0000 mg | INTRAMUSCULAR | Status: DC | PRN

## 2012-10-10 MED ORDER — DIAZEPAM 5 MG PO TABS
5.0000 mg | ORAL_TABLET | Freq: Once | ORAL | Status: AC
  Administered 2012-10-10: 5 mg via ORAL
  Filled 2012-10-10: qty 1

## 2012-10-10 MED ORDER — LIDOCAINE HCL (CARDIAC) 20 MG/ML IV SOLN
INTRAVENOUS | Status: DC | PRN
  Administered 2012-10-10: 70 mg via INTRAVENOUS

## 2012-10-10 MED ORDER — FENTANYL CITRATE 0.05 MG/ML IJ SOLN
INTRAMUSCULAR | Status: DC | PRN
  Administered 2012-10-10 (×2): 50 ug via INTRAVENOUS

## 2012-10-10 MED ORDER — ONDANSETRON HCL 4 MG PO TABS: 4.0000 mg | ORAL_TABLET | Freq: Four times a day (QID) | ORAL | Status: DC | PRN

## 2012-10-10 MED ORDER — LACTATED RINGERS IV SOLN
INTRAVENOUS | Status: DC
  Administered 2012-10-10 (×2): via INTRAVENOUS

## 2012-10-10 SURGICAL SUPPLY — 50 items
BANDAGE COBAN STERILE 2 (GAUZE/BANDAGES/DRESSINGS) ×2 IMPLANT
BANDAGE ESMARK 6X9 LF (GAUZE/BANDAGES/DRESSINGS) ×1 IMPLANT
BANDAGE GAUZE ELAST BULKY 4 IN (GAUZE/BANDAGES/DRESSINGS) ×2 IMPLANT
BLADE SAW RECIP 87.9 MT (BLADE) ×2 IMPLANT
BNDG COHESIVE 6X5 TAN STRL LF (GAUZE/BANDAGES/DRESSINGS) ×4 IMPLANT
BNDG ESMARK 6X9 LF (GAUZE/BANDAGES/DRESSINGS) ×2
BNDG GAUZE STRTCH 6 (GAUZE/BANDAGES/DRESSINGS) IMPLANT
CLOTH BEACON ORANGE TIMEOUT ST (SAFETY) ×2 IMPLANT
COVER SURGICAL LIGHT HANDLE (MISCELLANEOUS) ×2 IMPLANT
CUFF TOURNIQUET SINGLE 34IN LL (TOURNIQUET CUFF) IMPLANT
CUFF TOURNIQUET SINGLE 44IN (TOURNIQUET CUFF) IMPLANT
DRAIN PENROSE 1/2X12 LTX STRL (WOUND CARE) IMPLANT
DRAPE EXTREMITY T 121X128X90 (DRAPE) ×2 IMPLANT
DRAPE PROXIMA HALF (DRAPES) ×4 IMPLANT
DRAPE U-SHAPE 47X51 STRL (DRAPES) ×4 IMPLANT
DRSG ADAPTIC 3X8 NADH LF (GAUZE/BANDAGES/DRESSINGS) ×2 IMPLANT
DRSG PAD ABDOMINAL 8X10 ST (GAUZE/BANDAGES/DRESSINGS) ×2 IMPLANT
DURAPREP 26ML APPLICATOR (WOUND CARE) ×2 IMPLANT
ELECT CAUTERY BLADE 6.4 (BLADE) IMPLANT
ELECT REM PT RETURN 9FT ADLT (ELECTROSURGICAL) ×2
ELECTRODE REM PT RTRN 9FT ADLT (ELECTROSURGICAL) ×1 IMPLANT
EVACUATOR 1/8 PVC DRAIN (DRAIN) IMPLANT
GAUZE SPONGE 4X4 16PLY XRAY LF (GAUZE/BANDAGES/DRESSINGS) ×2 IMPLANT
GLOVE BIOGEL PI IND STRL 9 (GLOVE) ×1 IMPLANT
GLOVE BIOGEL PI INDICATOR 9 (GLOVE) ×1
GLOVE SURG ORTHO 9.0 STRL STRW (GLOVE) ×2 IMPLANT
GOWN PREVENTION PLUS XLARGE (GOWN DISPOSABLE) ×2 IMPLANT
GOWN STRL REIN XL XLG (GOWN DISPOSABLE) ×2 IMPLANT
KIT BASIN OR (CUSTOM PROCEDURE TRAY) ×2 IMPLANT
KIT ROOM TURNOVER OR (KITS) ×2 IMPLANT
MANIFOLD NEPTUNE II (INSTRUMENTS) ×2 IMPLANT
NS IRRIG 1000ML POUR BTL (IV SOLUTION) ×2 IMPLANT
PACK GENERAL/GYN (CUSTOM PROCEDURE TRAY) ×2 IMPLANT
PAD ARMBOARD 7.5X6 YLW CONV (MISCELLANEOUS) ×4 IMPLANT
PAD CAST 4YDX4 CTTN HI CHSV (CAST SUPPLIES) ×1 IMPLANT
PADDING CAST COTTON 4X4 STRL (CAST SUPPLIES) ×1
PADDING CAST COTTON 6X4 STRL (CAST SUPPLIES) ×2 IMPLANT
SPONGE GAUZE 4X4 12PLY (GAUZE/BANDAGES/DRESSINGS) ×2 IMPLANT
SPONGE LAP 18X18 X RAY DECT (DISPOSABLE) IMPLANT
STAPLER VISISTAT 35W (STAPLE) IMPLANT
STOCKINETTE IMPERVIOUS LG (DRAPES) IMPLANT
SUT PDS AB 1 CT  36 (SUTURE)
SUT PDS AB 1 CT 36 (SUTURE) IMPLANT
SUT SILK 2 0 (SUTURE) ×1
SUT SILK 2-0 18XBRD TIE 12 (SUTURE) ×1 IMPLANT
SWAB COLLECTION DEVICE MRSA (MISCELLANEOUS) IMPLANT
TOWEL OR 17X24 6PK STRL BLUE (TOWEL DISPOSABLE) ×2 IMPLANT
TOWEL OR 17X26 10 PK STRL BLUE (TOWEL DISPOSABLE) ×2 IMPLANT
TUBE ANAEROBIC SPECIMEN COL (MISCELLANEOUS) IMPLANT
WATER STERILE IRR 1000ML POUR (IV SOLUTION) ×2 IMPLANT

## 2012-10-10 NOTE — Anesthesia Postprocedure Evaluation (Signed)
  Anesthesia Post-op Note  Patient: Sara [name]  Procedure(s) Performed: Procedure(s) (LRB) with comments: AMPUTATION ABOVE KNEE (Right)  Patient Location: PACU  Anesthesia Type:General  Level of Consciousness: awake  Airway and Oxygen Therapy: Patient Spontanous Breathing  Post-op Pain: mild  Post-op Assessment: Post-op Vital signs reviewed  Post-op Vital Signs: Reviewed  Complications: No apparent anesthesia complications

## 2012-10-10 NOTE — Clinical Social Work Note (Signed)
Clinical Social Work   CSW met with pt and daughter to provide update. CSW is continuing to follow for discharge planning. Pt is for surgery today. CSW updated facility. Pt has a bed hold at [facility] and bed will be available when pt is medically ready. CSW will update FL2 and send it with clinicals to facility. CSW will continue to follow.   Dede Query, MSW, [facility] [phone]

## 2012-10-10 NOTE — Preoperative (Signed)
Beta Blockers   Reason not to administer Beta Blockers:Not Applicable 

## 2012-10-10 NOTE — Op Note (Signed)
OPERATIVE REPORT  DATE OF SURGERY: 10/10/2012  PATIENT:  Sara [name],  79 y.o. female  PRE-OPERATIVE DIAGNOSIS:  Osteomytitis Right Leg with gangrene  POST-OPERATIVE DIAGNOSIS:  Osteomytitis Right Leg with gangrene  PROCEDURE:  Procedure(s): AMPUTATION ABOVE KNEE  SURGEON:  Surgeon(s): Nadara Mustard, MD  ANESTHESIA:   general  EBL:  Minimal ML  SPECIMEN:  Source of Specimen:  Right leg  TOURNIQUET:  * No tourniquets in log *  PROCEDURE DETAILS: Patient is a 79 year old woman with severe peripheral vascular disease she is ischemic gangrenous changes to the right lower extremity she is not been able to obtain comfort or wound healing with conservative wound care treatment and do to her persistent pain patient states she like to proceed with above-knee amputation. Risks and benefits were discussed including infection neurovascular injury nonhealing of the wound need for higher level amputation. Patient states he understands was to proceed at this time. Description of procedure patient brought to the operating room and underwent a general anesthetic. After adequate levels and anesthesia obtained patient's right lower extremity was prepped using DuraPrep draped in a sterile field and the ischemic gangrenous right leg was draped draped out of sterile field with an impervious stocking at. A fishmouth incision was made through the mid thigh this was carried down to the bone with electrocautery. The vascular bundle was suture ligated with 2-0 silk. This was essentially completely calcified with essentially no flow through the femoral artery. The sciatic nerve was pulled cut and allowed to retract. The leg was amputated through the mid femur. Irrigation was performed hemostasis was obtained the deep and superficial fascial layers were closed using #1 PDS. The skin was closed using staples. The wound is covered with Adaptic orthopedic sponges AB dressing Kerlix Coban and tape. Patient was extubated  taken to the PACU in stable condition.  PLAN OF CARE: Admit to inpatient   PATIENT DISPOSITION:  PACU - hemodynamically stable.   Nadara Mustard, MD 10/10/2012 5:47 PM

## 2012-10-10 NOTE — Anesthesia Preprocedure Evaluation (Signed)
Anesthesia Evaluation  Patient identified by MRN, date of birth, ID band Patient awake    Reviewed: Allergy & Precautions, H&P , NPO status , Patient's Chart, lab work & pertinent test results, reviewed documented beta blocker date and time   Airway Mallampati: II TM Distance: >3 FB Neck ROM: full    Dental   Pulmonary shortness of breath and with exertion, pneumonia -, resolved,  breath sounds clear to auscultation        Cardiovascular hypertension, On Medications and On Home Beta Blockers + CAD, + Past MI, + Peripheral Vascular Disease and +CHF + dysrhythmias Atrial Fibrillation Rhythm:regular     Neuro/Psych TIACVA negative psych ROS   GI/Hepatic Neg liver ROS, hiatal hernia,   Endo/Other  negative endocrine ROS  Renal/GU negative Renal ROS  negative genitourinary   Musculoskeletal   Abdominal   Peds  Hematology  (+) Blood dyscrasia, anemia ,   Anesthesia Other Findings See surgeon's H&P   Reproductive/Obstetrics negative OB ROS                           Anesthesia Physical Anesthesia Plan  ASA: IV  Anesthesia Plan: General   Post-op Pain Management:    Induction: Intravenous  Airway Management Planned: LMA  Additional Equipment:   Intra-op Plan:   Post-operative Plan: Extubation in OR  Informed Consent: I have reviewed the patients History and Physical, chart, labs and discussed the procedure including the risks, benefits and alternatives for the proposed anesthesia with the patient or authorized representative who has indicated his/her understanding and acceptance.   Dental Advisory Given  Plan Discussed with: CRNA and Surgeon  Anesthesia Plan Comments:         Anesthesia Quick Evaluation

## 2012-10-10 NOTE — Progress Notes (Signed)
Order written for SNF placement,  Patient is OK for D/C when bed available

## 2012-10-10 NOTE — Transfer of Care (Signed)
Immediate Anesthesia Transfer of Care Note  Patient: Sara [name]  Procedure(s) Performed: Procedure(s) (LRB) with comments: AMPUTATION ABOVE KNEE (Right)  Patient Location: PACU  Anesthesia Type:General  Level of Consciousness: awake and alert   Airway & Oxygen Therapy: Patient Spontanous Breathing and Patient connected to nasal cannula oxygen  Post-op Assessment: Report given to PACU RN and Post -op Vital signs reviewed and stable  Post vital signs: Reviewed and stable  Complications: No apparent anesthesia complications

## 2012-10-10 NOTE — Progress Notes (Signed)
[facility] PROGRESS NOTE  Sara [name] WGN:[id number] DOB: 30-Oct-1918 DOA: 10/05/2012 PCP: Londell Moh, MD Brief Narrative: Sara [name] is a 79 y.o. female , resident of [facility], PMH of cutaneous T-cell lymphoma/mycosis fungoidis, psoriasis, nonhealing wound right lateral malleolus, chronic A. fib, hypertension, CAD, chronic diastolic CHF, prior CVAs, HL presented to ED on 1/23 with an episode of passing out on the afternoon of 10/05/2012. Patient is unable to provide details of the event. These are provided by patient's daughter, Ms. [name] who is at bedside. Since yesterday patient apparently has been complaining of progressive pain in her right groin/thigh, unable to straighten leg out, inability to stand. She had extensive x-rays of right lower extremity and pelvis which were apparently negative for fractures or acute events. Patient saw her in to visit her this afternoon. At approximately 12:30 PM, patient was at lunch room. She however was not answering appropriately and indicated that she was not feeling well. Daughter took her back to her room on her wheelchair. Patient was trying to eat ice cream in the room when she became unresponsive, leaning to the right with ice cream drooling from right side of her mouth. No preceding or subsequent symptoms of chest pain, palpitations or headaches. Exact duration of unresponsiveness unclear-daughter indicates 10-15 minutes. No seizure-like activity or urinary incontinence.   Assessment/Plan: Chronic right leg wounds with cellulitis Continue wound care. Continue IV vancomycin and aztreonam per pharmacy.  Dr. Lajoyce Corners to perform amputation today.  Fever Likely due to cellulitis.  Antibiotics as indicated above.  Syncope Likely secondary to symptomatic hypotension. No events on telemetry. MRI brain negative for acute infarct. 2D Echo and carotid dopplers done with results as indicated below. Neurology signed off. EEG on 10/06/2012  normal EEG. Continue aspirin and Plavix per home regimen.  Hypotension Resolved.  Likely secondary to dehydration and polypharmacy. Blood pressures improved after 500 mL normal saline bolus and actually in the hypertensive range now.  Continue to hold spironolactone. On lower dose of lisinopril. Titrate blood pressure medications as tolerated. Blood pressure stable.  Dehydration Resolved with IV hydration.  Fluids KVO.  Cutaneous T-cell lymphoma/mycosis fungoides Management per Dr. Cyndie Chime, on low-dose Targretin 75 mg daily as outpatient.  Right thigh pain Likely due to positioning from her chronic right lower extremity numbness. X-rays of pelvis, right femur and knee negative. Continue pain medications.   Chronic diastolic heart failure Compensated. Continue diuretics, except spironolactone.  Continue to monitor.  Anemia Likely due to chronic disease.  Patient to be transfused 2 units of PRBC today prior to surgery.  Hypertension Management as above.  Paroxysmal A. Fib Currently in sinus rhythm. Continue aspirin and Plavix.  CAD On Aspirin and plavix.  Leukocytosis Likely due to cellulitis and lymphoma.  Code Status: DNR/DNI Family Communication: Daughter at bedside. Disposition Plan: Pending.  Will need SNF at discharge.  Consultants:  Neurology  Ortho, Dr. Lajoyce Corners  Wound care  Procedures: EEG 10/06/2012: Normal EEG.  2D Echo on 10/06/2012 Study Conclusions - Left ventricle: The cavity size was normal. Wall thickness was normal. Systolic function was normal. The estimated ejection fraction was in the range of 60% to 65%. Wall motion was normal; there were no regional wall motion abnormalities. Features are consistent with a pseudonormal left ventricular filling pattern, with concomitant abnormal relaxation and increased filling pressure (grade 2 diastolic dysfunction). Doppler parameters are consistent with elevated ventricular end-diastolic filling pressure. -  Mitral valve: Moderate severe mitral annular and moderate submitral calcification Mild regurgitation. -  Right ventricle: Systolic pressure was increased. - Atrial septum: No defect or patent foramen ovale was identified. - Pulmonary arteries: PA peak pressure: 39mm Hg (S). Impressions: - The right ventricular systolic pressure was increased consistent with mild pulmonary hypertension.  Carotid Dopplers on 10/06/2012 Bilateral: No evidence of hemodynamically significant internal carotid artery stenosis. Left ICA stent within normal limits. Vertebral artery flow is antegrade.   Antibiotics:  Vancomycin 10/05/2012 >>  Aztreonam 10/05/2012 >>  HPI/Subjective: Has had low-grade fever during the night.  No complaints except for right leg pain.  Objective: Filed Vitals:   10/09/12 2116 10/10/12 0131 10/10/12 0603 10/10/12 1201  BP: 105/65 118/48 116/58 103/65  Pulse:  78 93 93  Temp:  100.3 F (37.9 C) 99.9 F (37.7 C) 99.1 F (37.3 C)  TempSrc:  Oral Oral Oral  Resp:  17 18 16   Height:      Weight:      SpO2:  95% 93%     Intake/Output Summary (Last 24 hours) at 10/10/12 1208 Last data filed at 10/10/12 1201  Gross per 24 hour  Intake    880 ml  Output    300 ml  Net    580 ml   Filed Weights   10/05/12 2036  Weight: 65.2 kg (143 lb 11.8 oz)    Exam: Physical Exam: General: Awake, Oriented, No acute distress. HEENT: EOMI. Neck: Supple CV: S1 and S2 Lungs: Clear to ascultation bilaterally Abdomen: Soft, Nontender, Nondistended, +bowel sounds. Ext: Good pulses. Trace edema. Multiple ulcerated lesion on legs bilaterally, most prominent on right medial ankle, unchanged, with foul smell.  Discoloration over the right upper medial thigh, not suggestive of cellulitis.  Data Reviewed: Basic Metabolic Panel:  Lab 10/09/12 1610 10/08/12 0610 10/06/12 0648 10/05/12 1544  NA 130* 131* 138 137  K 4.5 4.3 4.5 4.4  CL 95* 96 103 99  CO2 25 26 25 25   GLUCOSE 96 106* 106* 111*   BUN 22 20 20  31*  CREATININE 0.64 0.66 0.62 1.00  CALCIUM 9.1 9.0 8.8 9.2  MG -- -- -- --  PHOS -- -- -- --   Liver Function Tests:  Lab 10/05/12 1544  AST 23  ALT 25  ALKPHOS 57  BILITOT 0.1*  PROT 6.5  ALBUMIN 2.7*   No results found for this basename: LIPASE:5,AMYLASE:5 in the last 168 hours No results found for this basename: AMMONIA:5 in the last 168 hours CBC:  Lab 10/10/12 0655 10/09/12 0500 10/08/12 0610 10/06/12 0648 10/05/12 1544  WBC 20.6* 19.2* 22.2* 23.4* 25.1*  NEUTROABS -- -- -- -- 9.8*  HGB 8.7* 8.6* 8.3* 9.3* 9.9*  HCT 26.0* 26.7* 25.1* 28.2* 30.5*  MCV 83.6 85.3 85.1 85.5 85.9  PLT 423* 375 361 351 359   Cardiac Enzymes:  Lab 10/05/12 1545  CKTOTAL --  CKMB --  CKMBINDEX --  TROPONINI <0.30   BNP (last 3 results)  Basename 10/05/12 1545 03/24/12 0400 03/22/12 0358  PROBNP 3200.0* 2150.0* 2193.0*   CBG:  Lab 10/08/12 0701 10/05/12 1428  GLUCAP 117* 136*    Recent Results (from the past 240 hour(s))  CULTURE, BLOOD (ROUTINE X 2)     Status: Normal (Preliminary result)   Collection Time   10/05/12  3:45 PM      Component Value Range Status Comment   Specimen Description BLOOD ARM RIGHT   Final    Special Requests BOTTLES DRAWN AEROBIC AND ANAEROBIC 10CC   Final    Culture  Setup Time  10/05/2012 23:37   Final    Culture     Final    Value:        BLOOD CULTURE RECEIVED NO GROWTH TO DATE CULTURE WILL BE HELD FOR 5 DAYS BEFORE ISSUING A FINAL NEGATIVE REPORT   Report Status PENDING   Incomplete   CULTURE, BLOOD (ROUTINE X 2)     Status: Normal (Preliminary result)   Collection Time   10/05/12  4:00 PM      Component Value Range Status Comment   Specimen Description BLOOD HAND RIGHT   Final    Special Requests BOTTLES DRAWN AEROBIC ONLY 10CC   Final    Culture  Setup Time 10/05/2012 23:37   Final    Culture     Final    Value:        BLOOD CULTURE RECEIVED NO GROWTH TO DATE CULTURE WILL BE HELD FOR 5 DAYS BEFORE ISSUING A FINAL NEGATIVE  REPORT   Report Status PENDING   Incomplete   URINE CULTURE     Status: Normal   Collection Time   10/05/12  6:25 PM      Component Value Range Status Comment   Specimen Description URINE, RANDOM   Final    Special Requests NONE   Final    Culture  Setup Time 10/05/2012 19:34   Final    Colony Count NO GROWTH   Final    Culture NO GROWTH   Final    Report Status 10/07/2012 FINAL   Final   SURGICAL PCR SCREEN     Status: Abnormal   Collection Time   10/10/12  6:02 AM      Component Value Range Status Comment   MRSA, PCR POSITIVE (*) NEGATIVE Final    Staphylococcus aureus POSITIVE (*) NEGATIVE Final      Studies: No results found.  Scheduled Meds:    . aspirin  81 mg Oral Daily  . aztreonam  1 g Intravenous Q8H  . bexarotene  75 mg Oral QAC supper  . carvedilol  12.5 mg Oral BID WC  . chlorhexidine  60 mL Topical Once  . clopidogrel  75 mg Oral Daily  . collagenase   Topical Daily  . diltiazem  120 mg Oral Daily  . docusate sodium  100 mg Oral BID  . enoxaparin (LOVENOX) injection  40 mg Subcutaneous Q24H  . ferrous sulfate  325 mg Oral BID  . folic acid  1 mg Oral Daily  . furosemide  40 mg Oral q morning - 10a  . hydrocortisone cream  1 application Topical BID  . hydrOXYzine  10 mg Oral QHS  . lisinopril  5 mg Oral Daily  . pantoprazole  40 mg Oral Daily  . polyethylene glycol  17 g Oral Daily  . protein supplement  2 scoop Oral BID  . senna  1 tablet Oral QHS  . vancomycin  750 mg Intravenous Q24H   Continuous Infusions:   Principal Problem:  *Syncope Active Problems:  Leukocytosis  Chronic diastolic HF (heart failure)  Anemia  Cellulitis  Wound of right leg  TIA (transient ischemic attack)  HTN (hypertension)  PAF (paroxysmal atrial fibrillation)  Right thigh pain  Dehydration  Hypotension    Vinton Layson A, MD  [facility] Pager [phone]. If 7PM-7AM, please contact night-coverage at [email or web address], password [facility] 10/10/2012, 12:08 PM  LOS: 5  days

## 2012-10-10 NOTE — Anesthesia Procedure Notes (Signed)
Procedure Name: LMA Insertion Date/Time: 10/10/2012 5:20 PM Performed by: Gayla Medicus Pre-anesthesia Checklist: Patient identified, Timeout performed, Emergency Drugs available, Suction available and Patient being monitored Patient Re-evaluated:Patient Re-evaluated prior to inductionOxygen Delivery Method: Circle system utilized Preoxygenation: Pre-oxygenation with 100% oxygen Intubation Type: IV induction LMA: LMA with gastric port inserted LMA Size: 4.0 Number of attempts: 2 Placement Confirmation: positive ETCO2 and breath sounds checked- equal and bilateral Tube secured with: Tape Dental Injury: Teeth and Oropharynx as per pre-operative assessment

## 2012-10-10 NOTE — Progress Notes (Signed)
Late entry:  Pt has been npo for surgery, may have meds with sips of water, 2 units of blood ordered for transfusion, 1st unit stated at noon. Pt tolerating well. At 1500 2nd unit infusing, no S/S of reaction, vital signs stable.  Daughter at bedside.

## 2012-10-10 NOTE — Progress Notes (Signed)
ANTIBIOTIC CONSULT NOTE - FOLLOW UP  Pharmacy Consult for Vanco/Aztreonam Indication: Cellulitis, RLE wound infection  Allergies  Allergen Reactions  . Penicillins Other (See Comments)    unknown  . Triamterene Other (See Comments)    unknown  . Alendronate Sodium Rash    Patient Measurements: Height: 5\' 7"  (170.2 cm) Weight: 143 lb 11.8 oz (65.2 kg) IBW/kg (Calculated) : 61.6  Adjusted Body Weight:   Vital Signs: Temp: 99.9 F (37.7 C) (01/28 0603) Temp src: Oral (01/28 0603) BP: 116/58 mmHg (01/28 0603) Pulse Rate: 93  (01/28 0603) Intake/Output from previous day: 01/27 0701 - 01/28 0700 In: 1240 [P.O.:1240] Out: 300 [Urine:300] Intake/Output from this shift:    Labs:  Basename 10/10/12 0655 10/09/12 0500 10/08/12 0610  WBC 20.6* 19.2* 22.2*  HGB 8.7* 8.6* 8.3*  PLT 423* 375 361  LABCREA -- -- --  CREATININE -- 0.64 0.66   Estimated Creatinine Clearance: 44.5 ml/min (by C-G formula based on Cr of 0.64). No results found for this basename: VANCOTROUGH:2,VANCOPEAK:2,VANCORANDOM:2,GENTTROUGH:2,GENTPEAK:2,GENTRANDOM:2,TOBRATROUGH:2,TOBRAPEAK:2,TOBRARND:2,AMIKACINPEAK:2,AMIKACINTROU:2,AMIKACIN:2, in the last 72 hours   Microbiology: Recent Results (from the past 720 hour(s))  CULTURE, BLOOD (ROUTINE X 2)     Status: Normal (Preliminary result)   Collection Time   10/05/12  3:45 PM      Component Value Range Status Comment   Specimen Description BLOOD ARM RIGHT   Final    Special Requests BOTTLES DRAWN AEROBIC AND ANAEROBIC 10CC   Final    Culture  Setup Time 10/05/2012 23:37   Final    Culture     Final    Value:        BLOOD CULTURE RECEIVED NO GROWTH TO DATE CULTURE WILL BE HELD FOR 5 DAYS BEFORE ISSUING A FINAL NEGATIVE REPORT   Report Status PENDING   Incomplete   CULTURE, BLOOD (ROUTINE X 2)     Status: Normal (Preliminary result)   Collection Time   10/05/12  4:00 PM      Component Value Range Status Comment   Specimen Description BLOOD HAND RIGHT   Final     Special Requests BOTTLES DRAWN AEROBIC ONLY 10CC   Final    Culture  Setup Time 10/05/2012 23:37   Final    Culture     Final    Value:        BLOOD CULTURE RECEIVED NO GROWTH TO DATE CULTURE WILL BE HELD FOR 5 DAYS BEFORE ISSUING A FINAL NEGATIVE REPORT   Report Status PENDING   Incomplete   URINE CULTURE     Status: Normal   Collection Time   10/05/12  6:25 PM      Component Value Range Status Comment   Specimen Description URINE, RANDOM   Final    Special Requests NONE   Final    Culture  Setup Time 10/05/2012 19:34   Final    Colony Count NO GROWTH   Final    Culture NO GROWTH   Final    Report Status 10/07/2012 FINAL   Final   SURGICAL PCR SCREEN     Status: Abnormal   Collection Time   10/10/12  6:02 AM      Component Value Range Status Comment   MRSA, PCR POSITIVE (*) NEGATIVE Final    Staphylococcus aureus POSITIVE (*) NEGATIVE Final     Anti-infectives     Start     Dose/Rate Route Frequency Ordered Stop   10/06/12 1600   vancomycin (VANCOCIN) 750 mg in sodium chloride 0.9 % 150  mL IVPB        750 mg 150 mL/hr over 60 Minutes Intravenous Every 24 hours 10/05/12 2141     10/06/12 0100   aztreonam (AZACTAM) 1 g in dextrose 5 % 50 mL IVPB        1 g 100 mL/hr over 30 Minutes Intravenous Every 8 hours 10/05/12 2141     10/05/12 1545   aztreonam (AZACTAM) 1 g in dextrose 5 % 50 mL IVPB        1 g 100 mL/hr over 30 Minutes Intravenous  Once 10/05/12 1534 10/05/12 1758   10/05/12 1530   vancomycin (VANCOCIN) IVPB 1000 mg/200 mL premix        1,000 mg 200 mL/hr over 60 Minutes Intravenous  Once 10/05/12 1525 10/05/12 1721   10/05/12 1530   aztreonam (AZACTAM) injection 1 g  Status:  Discontinued        1 g Intramuscular  Once 10/05/12 1530 10/05/12 1534          Assessment: Admit Complaint:79 y/o female SNF resident brought to the ED after she passed out. She has been complaining of right groin, thigh pain. Vancomycin and aztreonam for RLE wounds, cellulitis.    Anticoagulation: SQ lovenox 40mg /day. Hgb only 8.7. Surgery 1/28 for R AKA for ischemic RLE.  Infectious Disease: Aztreonam + Vanc for Cellultis/wound infxn with RLE gangrenous ulcers. R AKA today. Tmax 100.6. WBC 20.6 (infxn + lymphoma). Likely to transition to doxy + cefuorixme if ok with Lajoyce Corners- not seen yet today and no abx plans in note yet. Aztreonam 1/23> Vancomycin 1/23>  1/23 UCx >> negative 1/23 BCx >> ngtd  Cardiovascular: Hx dHF/HTN/PAF/CAD, Echo EF 60-65%. VSS On ASA 81mg , coreg, plavix, diltiazem, lasix, lisinopril.   Endocrinology: folic acid; A1c 6%; CBGs < 782  Gastrointestinal / Nutrition; ppi po, Miralax, Beneprotein, Senna  Neurology; MRI brain (-) acute infact. Carotid duplex neg for signif internal carotid artery stenosis.   Nephrology: Scr 0.64, Na low 130  Hematology / Oncology: Chronic anemia; bexarotene ([place]: cutaneous T-cell lymphoma/mycosis fungoides)  PTA Medication Issues: some meds on hold. F/u resume spiro if tolerate  Best Practices: Lovenox   Goal of Therapy:  Vancomycin trough level 10-15 mcg/ml  Plan:  Plan - Continue Vancomycin 750 mg/24h --Order trough today - Cont Aztreonam 1g/8h    Ahilyn Nell S. Merilynn Finland, PharmD, BCPS Clinical Staff Pharmacist Pager [phone]   Misty Stanley Stillinger 10/10/2012,10:08 AM

## 2012-10-11 ENCOUNTER — Encounter (HOSPITAL_COMMUNITY): Payer: Self-pay | Admitting: Orthopedic Surgery

## 2012-10-11 DIAGNOSIS — I509 Heart failure, unspecified: Secondary | ICD-10-CM

## 2012-10-11 DIAGNOSIS — S78119A Complete traumatic amputation at level between unspecified hip and knee, initial encounter: Secondary | ICD-10-CM

## 2012-10-11 DIAGNOSIS — I5033 Acute on chronic diastolic (congestive) heart failure: Secondary | ICD-10-CM

## 2012-10-11 HISTORY — DX: Complete traumatic amputation at level between unspecified hip and knee, initial encounter: S78.119A

## 2012-10-11 LAB — CBC
HCT: 28.3 % — ABNORMAL LOW (ref 36.0–46.0)
MCV: 85.2 fL (ref 78.0–100.0)
RBC: 3.32 MIL/uL — ABNORMAL LOW (ref 3.87–5.11)
WBC: 18.3 10*3/uL — ABNORMAL HIGH (ref 4.0–10.5)

## 2012-10-11 LAB — TYPE AND SCREEN: ABO/RH(D): A POS

## 2012-10-11 LAB — CULTURE, BLOOD (ROUTINE X 2): Culture: NO GROWTH

## 2012-10-11 LAB — BASIC METABOLIC PANEL
BUN: 22 mg/dL (ref 6–23)
CO2: 28 mEq/L (ref 19–32)
Chloride: 95 mEq/L — ABNORMAL LOW (ref 96–112)
Creatinine, Ser: 0.66 mg/dL (ref 0.50–1.10)

## 2012-10-11 MED ORDER — LISINOPRIL 5 MG PO TABS: 5.0000 mg | ORAL_TABLET | Freq: Every day | ORAL | Status: DC

## 2012-10-11 MED ORDER — ACETAMINOPHEN 325 MG PO TABS: 650.0000 mg | ORAL_TABLET | ORAL | Status: DC | PRN

## 2012-10-11 MED ORDER — OXYCODONE HCL 5 MG PO TABS: 5.0000 mg | ORAL_TABLET | ORAL | Status: DC | PRN

## 2012-10-11 MED FILL — Morphine Sulfate Inj 2 MG/ML: INTRAMUSCULAR | Qty: 1 | Status: AC

## 2012-10-11 NOTE — Discharge Summary (Signed)
Physician Discharge Summary  Sara [name]:[id number] DOB: 08/01/1919 DOA: 10/05/2012  PCP: Londell Moh, MD  Admit date: 10/05/2012 Discharge date: 10/11/2012  Time spent: 50 minutes  Recommendations for Outpatient Follow-up:  1. Followup with Dr. Lajoyce Corners in 3 weeks 2. Check CBC in one week  Discharge Diagnoses:  Principal Problem:  *Syncope Active Problems:  Leukocytosis  Chronic diastolic HF (heart failure)  Anemia  Cellulitis  Wound of right leg  TIA (transient ischemic attack)  HTN (hypertension)  PAF (paroxysmal atrial fibrillation)  Right thigh pain  Dehydration  Hypotension   Discharge Condition: Stable  Diet recommendation: Low-salt diet  Filed Weights   10/05/12 2036  Weight: 65.2 kg (143 lb 11.8 oz)    History of present illness:  79 y.o. female , resident of [facility] [place], [place] of cutaneous T-cell lymphoma/mycosis fungoidis, psoriasis, nonhealing wound right lateral malleolus, chronic A. fib, hypertension, CAD, chronic diastolic CHF, prior CVAs, HL presented to ED on 1/23 with an episode of passing out this afternoon. Patient is unable to provide details of the event. These are provided by patient's daughter, Sara [name] who is at bedside. Since yesterday patient apparently has been complaining of progressive pain in her right groin/thigh, unable to straighten leg out, inability to stand. She had extensive x-rays of right lower extremity and pelvis which were apparently negative for fractures or acute events. Patient saw her in to visit her this afternoon. At approximately 12:30 PM, patient was at lunch room. She however was not answering appropriately and indicated that she was not feeling well. Daughter took her back to her room on her wheelchair. Patient was trying to eat ice cream in the room when she became unresponsive, leaning to the right with ice cream drooling from right side of her mouth. No preceding or subsequent symptoms of chest pain,  palpitations or headaches. Exact duration of unresponsiveness unclear-daughter indicates 10-15 minutes. No seizure-like activity or urinary incontinence. She was breathing. Nurses were alerted, 911 was called and subsequently patient was laid supine on bed at which point she regained consciousness and was coherent. No facial asymmetry, slurred speech or asymmetrical limb movements noticed. She was brought to the emergency department where she was hypotensive-resolved after IV fluid bolus, leukocytosis, CT head showing old infarcts but cannot rule out acute infarct, right leg showing chronic infected wounds and cellulitis. Neurology is consulted and recommended TIA workup. Hospitalist service is requested to admit for further evaluation and management.   Hospital Course:   Syncope  Likely secondary to symptomatic hypotension. No events on telemetry. MRI brain negative for acute infarct. 2D Echo and carotid dopplers done with results as indicated below. Neurology signed off. EEG on 10/06/2012 normal EEG. Continue aspirin and Plavix per home regimen.   Hypotension  Resolved. Likely secondary to dehydration and polypharmacy. Blood pressures improved after 500 mL normal saline bolus and actually in the hypertensive range now. Spironolactone was held and has been discontinued at this time due to the low blood pressure. We'll also reduce the dose of lisinopril to 5 mg by mouth daily.  Chronic right leg wounds with cellulitis  Patient was started on vancomycin and aztreonam. And also was seen by orthopedics. Patient underwent right AKA. At this time she has completed 6 days of antibiotics in the hospital and has been afebrile. Her blood cultures have been negative. Dr. Lajoyce Corners recommend to stop the antibiotics and he will follow the patient in 3 weeks in his office. Patient will be given oxycodone when  necessary for pain  Dehydration  Resolved with IV hydration.   Cutaneous T-cell lymphoma/mycosis fungoides    Management per Dr. Cyndie Chime, on low-dose Targretin 75 mg daily as outpatient.    Procedures:  Right above-knee amputation  Consultations:  Orthopedics  Neurology  Discharge Exam: Filed Vitals:   10/10/12 2000 10/10/12 2127 10/11/12 0154 10/11/12 0518  BP: 138/45 130/56 107/46 109/42  Pulse: 61 62 61 96  Temp: 98 F (36.7 C) 97.9 F (36.6 C) 97.8 F (36.6 C) 97.4 F (36.3 C)  TempSrc:  Oral Oral Oral  Resp: 14 16 18    Height:      Weight:      SpO2: 99% 99% 94% 98%    General: Appears in no acute distress Cardiovascular: S1-S2 regular Respiratory: Clear to auscultation bilaterally Extremities: Status post right AKA  Discharge Instructions  Discharge Orders    Future Appointments: Provider: Department: Dept Phone: Center:   10/17/2012 10:00 AM Dava Najjar [facility] MEDICAL ONCOLOGY [phone] None   10/17/2012 10:30 AM Levert Feinstein, MD [facility] MEDICAL ONCOLOGY [phone] None     Future Orders Please Complete By Expires   Diet - low sodium heart healthy      Increase activity slowly      Discharge instructions      Comments:   Follow up CBC in one week. If Fever develops, and pain in right stump. Call Dr Audrie Lia office.       Medication List     As of 10/11/2012  9:51 AM    STOP taking these medications         spironolactone 25 MG tablet   Commonly known as: ALDACTONE      TAKE these medications         acetaminophen 325 MG tablet   Commonly known as: TYLENOL   Take 2 tablets (650 mg total) by mouth every 4 (four) hours as needed (temperature >/= 99.5 F).      acetaminophen 500 MG tablet   Commonly known as: TYLENOL   Take 500 mg by mouth every 6 (six) hours as needed. For pain      aspirin 81 MG chewable tablet   Chew 81 mg by mouth daily.      bexarotene 75 MG Caps capsule   Commonly known as: TARGRETIN   Take 75 mg by mouth daily before supper. Give with food. Protect from light. CAUTION:  Chemotherapy/Biotherapy      carvedilol 12.5 MG tablet   Commonly known as: COREG   Take 12.5 mg by mouth 2 (two) times daily with a meal.      clopidogrel 75 MG tablet   Commonly known as: PLAVIX   Take 75 mg by mouth daily.      Co Q 10 100 MG Caps   Take 1 capsule by mouth daily.      diltiazem 120 MG 24 hr capsule   Commonly known as: CARDIZEM CD   Take 120 mg by mouth daily.      docusate sodium 100 MG capsule   Commonly known as: COLACE   Take 100 mg by mouth daily. For constipation      ferrous sulfate 325 (65 FE) MG tablet   Take 325 mg by mouth 2 (two) times daily.      Fish Oil 1000 MG Caps   Take 1,000 mg by mouth at bedtime.      folic acid 1 MG tablet   Commonly  known as: FOLVITE   Take 1 mg by mouth daily.      furosemide 40 MG tablet   Commonly known as: LASIX   Take 40 mg by mouth every morning.      hydrocortisone cream 1 %   Apply 1 application topically 2 (two) times daily. To flaking skin and rash      hydrOXYzine 10 MG tablet   Commonly known as: ATARAX/VISTARIL   Take 10 mg by mouth at bedtime.      lisinopril 5 MG tablet   Commonly known as: PRINIVIL,ZESTRIL   Take 1 tablet (5 mg total) by mouth daily.      nitroGLYCERIN 0.4 MG SL tablet   Commonly known as: NITROSTAT   Place 0.4 mg under the tongue every 5 (five) minutes as needed. For chest pain      oxyCODONE 5 MG immediate release tablet   Commonly known as: Oxy IR/ROXICODONE   Take 1-2 tablets (5-10 mg total) by mouth every 4 (four) hours as needed. Take 1 tablet for moderate pain and 2 tablets for severe pain      pantoprazole 40 MG tablet   Commonly known as: PROTONIX   Take 40 mg by mouth daily.      polyethylene glycol packet   Commonly known as: MIRALAX / GLYCOLAX   Take 17 g by mouth daily.      protein supplement Powd   Take 2 scoop by mouth 2 (two) times daily. In beverage      vitamin C 500 MG tablet   Commonly known as: ASCORBIC ACID   Take 500 mg by mouth at  bedtime.           Follow-up Information    Follow up with DUDA,MARCUS V, MD. In 3 weeks.   Contact information:   [address] [place] [address] [phone]           The results of significant diagnostics from this hospitalization (including imaging, microbiology, ancillary and laboratory) are listed below for reference.    Significant Diagnostic Studies: Dg Chest 1 View  10/05/2012  *RADIOLOGY REPORT*  Clinical Data: Leg ulceration.  CHEST - 1 VIEW  Comparison: PA and lateral chest 08/01/2012 and CT chest 06/02/2012.  Findings: Lungs are clear.  Heart size normal.  No pneumothorax or pleural fluid.  Remote fracture surgical neck left humerus is identified.  The patient is status post lower thoracic vertebroplasty.  IMPRESSION: No acute finding.   Original Report Authenticated By: Holley Dexter, M.D.    Dg Pelvis 1-2 Views  10/05/2012  *RADIOLOGY REPORT*  Clinical Data: Right thigh pain.  PELVIS - 1-2 VIEW  Comparison: Pelvic CT 06/02/2012  Findings: Single view of the pelvis was obtained.  The pelvic bony ring is intact.  There are vascular calcifications.  No gross abnormality to either hip.  Nonspecific bowel gas pattern.  Stool in the rectal region.  IMPRESSION: No acute bony abnormality.   Original Report Authenticated By: Richarda Overlie, M.D.    Dg Femur Right  10/05/2012  *RADIOLOGY REPORT*  Clinical Data: Right thigh pain.  RIGHT FEMUR - 2 VIEW  Comparison: Pelvis 10/05/2012  Findings: Two views of the right femur were obtained.  No evidence for acute fracture.  The right hip is located.  There are vascular calcifications.  Limited evaluation of the knee joint.  IMPRESSION: No evidence for acute fracture.   Original Report Authenticated By: Richarda Overlie, M.D.    Dg Knee 1-2 Views Right  10/05/2012  *RADIOLOGY REPORT*  Clinical Data: Right thigh pain.  Rule out fracture.  RIGHT KNEE - 1-2 VIEW  Comparison: Right femur 10/05/2012  Findings: Two views of the knee were  obtained.  The knee is located without acute fracture.  There are enthesopathic changes involving the patella.  Limited evaluation for joint space narrowing.  IMPRESSION: No acute bony abnormality.   Original Report Authenticated By: Richarda Overlie, M.D.    Dg Tibia/fibula Right  10/05/2012  *RADIOLOGY REPORT*  Clinical Data: Large ulceration over the Achilles tendon.  RIGHT TIBIA AND FIBULA - 2 VIEW  Comparison: Plain films right ankle 12/07/2011 and plain films right knee 01/14/2011.  Findings: Skin ulceration over the Achilles tendon is identified. No underlying soft tissue gas collection, radiopaque foreign body or bony destructive change is noted.  Bones are osteopenic.  There is no fracture.  Degenerative disease of the midfoot is noted.  IMPRESSION: Large skin ulceration over the Achilles without plain film evidence of osteomyelitis.   Original Report Authenticated By: Holley Dexter, M.D.    Dg Ankle Complete Right  10/05/2012  *RADIOLOGY REPORT*  Clinical Data: Large skin ulceration over the Achilles.  RIGHT ANKLE - COMPLETE 3+ VIEW  Comparison: MRI of the right ankle 01/09/2012 and plain films of the right foot 07/23/2012.  Findings: Large skin ulceration over the Achilles is identified. No underlying radiopaque foreign body, soft tissue gas collection or bony destructive change is noted.  Bones appear osteopenic. Midfoot degenerative disease of the right foot is noted.  IMPRESSION: Large skin ulceration without plain film evidence of osteomyelitis.   Original Report Authenticated By: Holley Dexter, M.D.    Ct Head Wo Contrast  10/05/2012  *RADIOLOGY REPORT*  Clinical Data: Episode of unresponsiveness for 2-3 minutes, history hypertension, coronary artery disease post MI, CHF  CT HEAD WITHOUT CONTRAST  Technique:  Contiguous axial images were obtained from the base of the skull through the vertex without contrast.  Comparison: 12/07/2011  Findings: Generalized atrophy. Normal ventricular  morphology. No midline shift or mass effect. Small vessel chronic ischemic changes of deep cerebral white matter. No intracranial hemorrhage or mass lesion. area of low attenuation identified at left middle cerebral peduncle could be related to white matter disease though a developing infarct is not excluded. Tiny old right thalamic and right basal ganglia lacunar infarcts. Scattered streak artifacts from skull base at brain stem and cerebellum. Atherosclerotic calcifications of internal carotid arteries at skull base. Visualized paranasal sinuses and mastoid air cells clear. No acute osseous findings.  IMPRESSION: Atrophy with extensive small vessel chronic ischemic changes of deep cerebral white matter. Tiny old right thalamic and right basal ganglia lacunar infarcts. Question white matter disease changes at the left middle cerebral peduncle though cannot completely exclude infarct.   Original Report Authenticated By: Ulyses Southward, M.D.    Mr Laqueta Jean Wo Contrast  10/05/2012  *RADIOLOGY REPORT*  Clinical Data: Syncope with right leg hemiparesis.  Atrial fibrillation  MRI HEAD WITHOUT AND WITH CONTRAST  Technique:  Multiplanar, multiecho pulse sequences of the brain and surrounding structures were obtained according to standard protocol without and with intravenous contrast  Contrast: 12mL MULTIHANCE GADOBENATE DIMEGLUMINE 529 MG/ML IV SOLN  Comparison: CT head 10/05/2012  Findings: Negative for acute infarct.  Chronic microvascular ischemic changes have progressed since the prior MRI of 12/08/2011.  There is diffuse chronic ischemia throughout the cerebral white matter.  There is chronic ischemia in the thalami and basal ganglia,  mid brain and pons bilaterally.  CT abnormality in the left mid brain is chronic.  Negative for intracranial hemorrhage.  No mass or edema.  Postcontrast imaging of the brain reveals normal enhancement.  No enhancing mass lesion is present.  Mild mucosal thickening right maxillary sinus.   IMPRESSION: No acute infarct.  Advanced chronic microvascular ischemia, with progression from 12/08/2011.   Original Report Authenticated By: Janeece Riggers, M.D.     Microbiology: Recent Results (from the past 240 hour(s))  CULTURE, BLOOD (ROUTINE X 2)     Status: Normal   Collection Time   10/05/12  3:45 PM      Component Value Range Status Comment   Specimen Description BLOOD ARM RIGHT   Final    Special Requests BOTTLES DRAWN AEROBIC AND ANAEROBIC 10CC   Final    Culture  Setup Time 10/05/2012 23:37   Final    Culture NO GROWTH 5 DAYS   Final    Report Status 10/11/2012 FINAL   Final   CULTURE, BLOOD (ROUTINE X 2)     Status: Normal   Collection Time   10/05/12  4:00 PM      Component Value Range Status Comment   Specimen Description BLOOD HAND RIGHT   Final    Special Requests BOTTLES DRAWN AEROBIC ONLY 10CC   Final    Culture  Setup Time 10/05/2012 23:37   Final    Culture NO GROWTH 5 DAYS   Final    Report Status 10/11/2012 FINAL   Final   URINE CULTURE     Status: Normal   Collection Time   10/05/12  6:25 PM      Component Value Range Status Comment   Specimen Description URINE, RANDOM   Final    Special Requests NONE   Final    Culture  Setup Time 10/05/2012 19:34   Final    Colony Count NO GROWTH   Final    Culture NO GROWTH   Final    Report Status 10/07/2012 FINAL   Final   SURGICAL PCR SCREEN     Status: Abnormal   Collection Time   10/10/12  6:02 AM      Component Value Range Status Comment   MRSA, PCR POSITIVE (*) NEGATIVE Final    Staphylococcus aureus POSITIVE (*) NEGATIVE Final      Labs: Basic Metabolic Panel:  Lab 10/11/12 1610 10/09/12 0500 10/08/12 0610 10/06/12 0648 10/05/12 1544  NA 133* 130* 131* 138 137  K 3.9 4.5 4.3 4.5 4.4  CL 95* 95* 96 103 99  CO2 28 25 26 25 25   GLUCOSE 114* 96 106* 106* 111*  BUN 22 22 20 20  31*  CREATININE 0.66 0.64 0.66 0.62 1.00  CALCIUM 8.6 9.1 9.0 8.8 9.2  MG -- -- -- -- --  PHOS -- -- -- -- --   Liver Function  Tests:  Lab 10/05/12 1544  AST 23  ALT 25  ALKPHOS 57  BILITOT 0.1*  PROT 6.5  ALBUMIN 2.7*   No results found for this basename: LIPASE:5,AMYLASE:5 in the last 168 hours No results found for this basename: AMMONIA:5 in the last 168 hours CBC:  Lab 10/11/12 0535 10/10/12 1943 10/10/12 0655 10/09/12 0500 10/08/12 0610 10/06/12 0648 10/05/12 1544  WBC 18.3* -- 20.6* 19.2* 22.2* 23.4* --  NEUTROABS -- -- -- -- -- -- 9.8*  HGB 9.5* 9.9* 8.7* 8.6* 8.3* -- --  HCT 28.3* 29.3* 26.0* 26.7* 25.1* -- --  MCV 85.2 -- 83.6 85.3 85.1 85.5 --  PLT 364 -- 423* 375 361 351 --   Cardiac Enzymes:  Lab 10/05/12 1545  CKTOTAL --  CKMB --  CKMBINDEX --  TROPONINI <0.30   BNP: BNP (last 3 results)  Basename 10/05/12 1545 03/24/12 0400 03/22/12 0358  PROBNP 3200.0* 2150.0* 2193.0*   CBG:  Lab 10/08/12 0701 10/05/12 1428  GLUCAP 117* 136*       Signed:  LAMA,GAGAN S  [facility] 10/11/2012, 9:51 AM

## 2012-10-11 NOTE — Progress Notes (Signed)
Patient d/c back to [facility] Transported by [employer], [employer] packet sent with [facility] staff Report called to Amina Nurse No c/o pain at d/c, family aware of transfer. Junaid Wurzer [employer]

## 2012-10-11 NOTE — Progress Notes (Signed)
Patient ID: Sara [name], female   DOB: [birth year], 79 y.o.   MRN: [id number] Patient is pain free this morning.  Recommend discontinue IV antibiotics.  I am leaving out of town tomorrow  and my office will be available.  I will followup in the office in 3 weeks.

## 2012-10-11 NOTE — Progress Notes (Signed)
Physical Therapy Treatment Patient Details Name: Sara [name] MRN: [id number] DOB: Jun 25, 1919 Today's Date: 10/11/2012 Time: 7829-5621 PT Time Calculation (min): 42 min  PT Assessment / Plan / Recommendation Comments on Treatment Session  Pt s/p recent AKA 10/10/12.  Pt reports no pain at rest but c/o pain with activity which is appropriate.  Pt cont's to require significant +2 total (A) for all mobility but is very motivated.      Follow Up Recommendations  SNF;Supervision/Assistance - 24 hour     Does the patient have the potential to tolerate intense rehabilitation     Barriers to Discharge        Equipment Recommendations  None recommended by PT    Recommendations for Other Services    Frequency Min 3X/week   Plan Discharge plan remains appropriate    Precautions / Restrictions Precautions Precautions: Fall Precaution Comments: pt bleeds easily with any tear to the skin Restrictions RLE Weight Bearing:  (new AKA)   Pertinent Vitals/Pain C/o pain in Rt AKA.      Mobility  Bed Mobility Bed Mobility: Supine to Sit;Sitting - Scoot to Edge of Bed;Sit to Supine Supine to Sit: 1: +2 Total assist;HOB elevated Supine to Sit: Patient Percentage: 10% Sitting - Scoot to Edge of Bed: 1: +2 Total assist Sitting - Scoot to Edge of Bed: Patient Percentage: 10% Sit to Supine: 1: +2 Total assist;HOB flat Sit to Supine: Patient Percentage: 20% Details for Bed Mobility Assistance: Pt limited by pain.  (A) for all components of transitioning.   pt with posterior push with supine>sit.   Transfers Transfers: Sit to Stand;Stand to Sit;Stand Pivot Transfers Sit to Stand: 1: +2 Total assist;With upper extremity assist;From bed Sit to Stand: Patient Percentage: 40% Stand to Sit: 1: +2 Total assist;With upper extremity assist;To bed Stand to Sit: Patient Percentage: 40% Details for Transfer Assistance: Pt used chair in front to pull up to standing.  Unable to fully stand due to weakness and  heel cord tightness. Practiced x 4. Ambulation/Gait Ambulation/Gait Assistance: Not tested (comment)       PT Goals Acute Rehab PT Goals Time For Goal Achievement: 10/21/12 Potential to Achieve Goals: Fair Pt will go Supine/Side to Sit: with min assist PT Goal: Supine/Side to Sit - Progress: Not met Pt will go Sit to Supine/Side: with min assist PT Goal: Sit to Supine/Side - Progress: Not met Pt will go Sit to Stand: with min assist PT Goal: Sit to Stand - Progress: Not met Pt will go Stand to Sit: with min assist PT Goal: Stand to Sit - Progress: Not met  Visit Information  Last PT Received On: 10/11/12 Assistance Needed: +2    Subjective Data      Cognition  Overall Cognitive Status: Appears within functional limits for tasks assessed/performed Arousal/Alertness: Awake/alert Orientation Level: Appears intact for tasks assessed Behavior During Session: [facility] for tasks performed    Balance  Balance Balance Assessed: Yes Static Sitting Balance Static Sitting - Balance Support: Bilateral upper extremity supported;Feet supported Static Sitting - Level of Assistance: 5: Stand by assistance Dynamic Sitting Balance Dynamic Sitting - Balance Support: During functional activity Dynamic Sitting - Level of Assistance: 4: Min assist Dynamic Sitting - Balance Activities: Forward lean/weight shifting  End of Session PT - End of Session Equipment Utilized During Treatment: Gait belt Activity Tolerance: Patient limited by pain Patient left: in bed;with call bell/phone within reach;with family/visitor present     Verdell Face, PTA [phone] 10/11/2012

## 2012-10-11 NOTE — Clinical Social Work Note (Signed)
Clinical Social Work   Pt is ready for discharge to [facility]. Facility has received discharge summary and is ready to accept pt. Pt and family are agreeable to discharge plan. [facility] will provide transportation. CSW is signing off as no further needs identified.   Dede Query, MSW, Theresia Majors [phone]

## 2012-10-11 NOTE — Progress Notes (Signed)
ANTIBIOTIC CONSULT NOTE - FOLLOW UP  Pharmacy Consult for Vanco/Aztreonam Indication:  RLE wounds, cellulitis.   Allergies  Allergen Reactions  . Penicillins Other (See Comments)    unknown  . Triamterene Other (See Comments)    unknown  . Alendronate Sodium Rash    Patient Measurements: Height: 5\' 7"  (170.2 cm) Weight: 143 lb 11.8 oz (65.2 kg) IBW/kg (Calculated) : 61.6  Adjusted Body Weight:    Vital Signs: Temp: 97.4 F (36.3 C) (01/29 0518) Temp src: Oral (01/29 0518) BP: 109/42 mmHg (01/29 0518) Pulse Rate: 96  (01/29 0518) Intake/Output from previous day: 01/28 0701 - 01/29 0700 In: 1225 [I.V.:525; Blood:700] Out: -  Intake/Output from this shift:    Labs:  Basename 10/11/12 0535 10/10/12 1943 10/10/12 0655 10/09/12 0500  WBC 18.3* -- 20.6* 19.2*  HGB 9.5* 9.9* 8.7* --  PLT 364 -- 423* 375  LABCREA -- -- -- --  CREATININE 0.66 -- -- 0.64   Estimated Creatinine Clearance: 44.5 ml/min (by C-G formula based on Cr of 0.66).  Basename 10/10/12 2129  VANCOTROUGH <5.0*  VANCOPEAK --  Drue Dun --  GENTTROUGH --  GENTPEAK --  GENTRANDOM --  TOBRATROUGH --  TOBRAPEAK --  TOBRARND --  AMIKACINPEAK --  AMIKACINTROU --  AMIKACIN --     Microbiology: Recent Results (from the past 720 hour(s))  CULTURE, BLOOD (ROUTINE X 2)     Status: Normal (Preliminary result)   Collection Time   10/05/12  3:45 PM      Component Value Range Status Comment   Specimen Description BLOOD ARM RIGHT   Final    Special Requests BOTTLES DRAWN AEROBIC AND ANAEROBIC 10CC   Final    Culture  Setup Time 10/05/2012 23:37   Final    Culture     Final    Value:        BLOOD CULTURE RECEIVED NO GROWTH TO DATE CULTURE WILL BE HELD FOR 5 DAYS BEFORE ISSUING A FINAL NEGATIVE REPORT   Report Status PENDING   Incomplete   CULTURE, BLOOD (ROUTINE X 2)     Status: Normal (Preliminary result)   Collection Time   10/05/12  4:00 PM      Component Value Range Status Comment   Specimen  Description BLOOD HAND RIGHT   Final    Special Requests BOTTLES DRAWN AEROBIC ONLY 10CC   Final    Culture  Setup Time 10/05/2012 23:37   Final    Culture     Final    Value:        BLOOD CULTURE RECEIVED NO GROWTH TO DATE CULTURE WILL BE HELD FOR 5 DAYS BEFORE ISSUING A FINAL NEGATIVE REPORT   Report Status PENDING   Incomplete   URINE CULTURE     Status: Normal   Collection Time   10/05/12  6:25 PM      Component Value Range Status Comment   Specimen Description URINE, RANDOM   Final    Special Requests NONE   Final    Culture  Setup Time 10/05/2012 19:34   Final    Colony Count NO GROWTH   Final    Culture NO GROWTH   Final    Report Status 10/07/2012 FINAL   Final   SURGICAL PCR SCREEN     Status: Abnormal   Collection Time   10/10/12  6:02 AM      Component Value Range Status Comment   MRSA, PCR POSITIVE (*) NEGATIVE Final    Staphylococcus  aureus POSITIVE (*) NEGATIVE Final     Anti-infectives     Start     Dose/Rate Route Frequency Ordered Stop   10/06/12 1600   vancomycin (VANCOCIN) 750 mg in sodium chloride 0.9 % 150 mL IVPB        750 mg 150 mL/hr over 60 Minutes Intravenous Every 24 hours 10/05/12 2141     10/06/12 0100   aztreonam (AZACTAM) 1 g in dextrose 5 % 50 mL IVPB        1 g 100 mL/hr over 30 Minutes Intravenous Every 8 hours 10/05/12 2141     10/05/12 1545   aztreonam (AZACTAM) 1 g in dextrose 5 % 50 mL IVPB        1 g 100 mL/hr over 30 Minutes Intravenous  Once 10/05/12 1534 10/05/12 1758   10/05/12 1530   vancomycin (VANCOCIN) IVPB 1000 mg/200 mL premix        1,000 mg 200 mL/hr over 60 Minutes Intravenous  Once 10/05/12 1525 10/05/12 1721   10/05/12 1530   aztreonam (AZACTAM) injection 1 g  Status:  Discontinued        1 g Intramuscular  Once 10/05/12 1530 10/05/12 1534          Assessment: Admit Complaint:79 y/o female SNF resident brought to the ED after she passed out. She has been complaining of right groin, thigh pain. Vancomycin and  aztreonam for RLE wounds, cellulitis.   --Infectious Disease: Aztreonam + Vanc for Cellultis/wound infxn with RLE gangrenous ulcers. R AKA today. Tmax 99.1. WBC 18.3 (infxn + lymphoma). Duda recommending d/c abx. 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  Misty Stanley Stillinger 10/11/2012,8:03 AM

## 2012-10-11 NOTE — Progress Notes (Signed)
Occupational Therapy Treatment Patient Details Name: Sara [name] MRN: [id number] DOB: 1919/07/02 Today's Date: 10/11/2012 Time: 4098-1191 OT Time Calculation (min): 47 min  OT Assessment / Plan / Recommendation Comments on Treatment Session Pt is recovering from R AKA performed yesterday.  Limited by generalized pain, post surgical pain, and phantom pain.  Very cooperative despite her pain.  Focus of session on sitting balance and sit to stand in prep for ADL.      Follow Up Recommendations  SNF    Barriers to Discharge       Equipment Recommendations       Recommendations for Other Services    Frequency Min 2X/week   Plan Discharge plan remains appropriate    Precautions / Restrictions Precautions Precautions: Fall Precaution Comments: pt bleeds easily with any tear to the skin Restrictions RLE Weight Bearing:  (new AKA)   Pertinent Vitals/Pain Generalized pain, post surgical pain in L LE, phantom pain, L LE, premedicated, repositioned    ADL  Grooming: Brushing hair;Min guard Where Assessed - Grooming: Unsupported sitting (assist for sitting balance at EOB) Lower Body Bathing: +2 Total assistance (for pericare after bedpan use) Where Assessed - Lower Body Bathing: Rolling right and/or left Equipment Used: Gait belt ADL Comments: Focused on sitting balance during simple ADL at EOB.    OT Diagnosis:    OT Problem List:   OT Treatment Interventions:     OT Goals ADL Goals Pt Will Perform Grooming: with set-up;Sitting, edge of bed;Sitting, chair;Unsupported ADL Goal: Grooming - Progress: Progressing toward goals Pt Will Transfer to Toilet: with min assist;Stand pivot transfer;with DME;3-in-1 ADL Goal: Toilet Transfer - Progress: Not progressing Pt Will Perform Toileting - Clothing Manipulation: with min assist;Sitting on 3-in-1 or toilet;Standing Pt Will Perform Toileting - Hygiene: with min assist;Sit to stand from 3-in-1/toilet ADL Goal: Toileting - Hygiene -  Progress: Not progressing Miscellaneous OT Goals Miscellaneous OT Goal #1: Pt will perform bed mobility with mod assist as precursor for EOB ADLs. OT Goal: Miscellaneous Goal #1 - Progress: Progressing toward goals Miscellaneous OT Goal #2: Pt will tolerate static sitting balance task EOB >5 min at supervision level as precursor to toilet transfer. OT Goal: Miscellaneous Goal #2 - Progress: Met  Visit Information  Last OT Received On: 10/11/12 Assistance Needed: +2 PT/OT Co-Evaluation/Treatment: Yes    Subjective Data      Prior Functioning       Cognition  Overall Cognitive Status: Appears within functional limits for tasks assessed/performed Arousal/Alertness: Awake/alert Orientation Level: Appears intact for tasks assessed Behavior During Session: [facility] for tasks performed    Mobility  Shoulder Instructions Bed Mobility Bed Mobility: Supine to Sit;Sitting - Scoot to Edge of Bed;Sit to Supine Supine to Sit: 1: +2 Total assist;HOB elevated Supine to Sit: Patient Percentage: 10% Sitting - Scoot to Edge of Bed: 1: +2 Total assist Sitting - Scoot to Edge of Bed: Patient Percentage: 10% Sit to Supine: 1: +2 Total assist;HOB flat Sit to Supine: Patient Percentage: 20% Details for Bed Mobility Assistance: Pt limited by pain. Transfers Transfers: Sit to Stand;Stand to Sit Sit to Stand: 1: +2 Total assist;With upper extremity assist;From bed Sit to Stand: Patient Percentage: 40% Stand to Sit: 1: +2 Total assist;With upper extremity assist;To bed Stand to Sit: Patient Percentage: 40% Details for Transfer Assistance: Pt used chair in front to pull up to standing.  Unable to fully stand due to weakness and heel cord tightness. Practiced x 4.       Exercises  Balance Balance Balance Assessed: Yes Static Sitting Balance Static Sitting - Balance Support: Bilateral upper extremity supported;Feet supported Static Sitting - Level of Assistance: 4: Min assist Dynamic Sitting  Balance Dynamic Sitting - Balance Support: During functional activity Dynamic Sitting - Level of Assistance: 4: Min assist (posterior lean) Dynamic Sitting - Balance Activities: Forward lean/weight shifting (combing hair)   End of Session OT - End of Session Equipment Utilized During Treatment: Gait belt Activity Tolerance: Patient limited by pain Patient left: in bed;with call bell/phone within reach;with family/visitor present;with nursing in room  GO     Evern Bio 10/11/2012, 11:48 AM

## 2012-10-12 ENCOUNTER — Telehealth: Payer: Self-pay | Admitting: *Deleted

## 2012-10-12 NOTE — Telephone Encounter (Signed)
Late Entry:  Pt given scripts 09/25/12 stating cbc, diff weekly start 10/03/12 & fax result to Dr. Cyndie Chime @ [phone] & Pt may use home supply of targretin to start at 75 mg po daily.

## 2012-10-12 NOTE — Telephone Encounter (Signed)
Message left for Sara [name] At [facility] that Dr. Cyndie Chime is OK with cancelling appt & would like them to call us when they feel she is stable enough for transport here.  Appts cancelled.

## 2012-10-12 NOTE — Telephone Encounter (Signed)
Received call from Sara [name] at [facility] stating that pt was in the hosp last week & had a right above knee amputation & thinks pt should probably r/s MD appt for 10/17/12 for lab & MD.  Message to Dr Cyndie Chime.

## 2012-10-16 ENCOUNTER — Encounter: Payer: Self-pay | Admitting: Oncology

## 2012-10-17 ENCOUNTER — Ambulatory Visit: Admitting: Oncology

## 2012-10-17 ENCOUNTER — Other Ambulatory Visit: Admitting: Lab

## 2012-10-18 ENCOUNTER — Emergency Department (HOSPITAL_COMMUNITY): Payer: Medicare Other

## 2012-10-18 ENCOUNTER — Inpatient Hospital Stay (HOSPITAL_COMMUNITY)
Admission: EM | Admit: 2012-10-18 | Discharge: 2012-10-21 | DRG: 292 | Disposition: A | Discharge status: Skilled Nursing Facility | Payer: Medicare Other | Attending: Internal Medicine | Admitting: Internal Medicine

## 2012-10-18 ENCOUNTER — Encounter (HOSPITAL_COMMUNITY): Payer: Self-pay | Admitting: *Deleted

## 2012-10-18 DIAGNOSIS — E871 Hypo-osmolality and hyponatremia: Secondary | ICD-10-CM | POA: Diagnosis present

## 2012-10-18 DIAGNOSIS — L89309 Pressure ulcer of unspecified buttock, unspecified stage: Secondary | ICD-10-CM | POA: Diagnosis present

## 2012-10-18 DIAGNOSIS — Z66 Do not resuscitate: Secondary | ICD-10-CM | POA: Diagnosis present

## 2012-10-18 DIAGNOSIS — L408 Other psoriasis: Secondary | ICD-10-CM | POA: Diagnosis present

## 2012-10-18 DIAGNOSIS — E785 Hyperlipidemia, unspecified: Secondary | ICD-10-CM | POA: Diagnosis present

## 2012-10-18 DIAGNOSIS — I252 Old myocardial infarction: Secondary | ICD-10-CM

## 2012-10-18 DIAGNOSIS — C8404 Mycosis fungoides, lymph nodes of axilla and upper limb: Secondary | ICD-10-CM | POA: Diagnosis present

## 2012-10-18 DIAGNOSIS — K5900 Constipation, unspecified: Secondary | ICD-10-CM | POA: Diagnosis present

## 2012-10-18 DIAGNOSIS — L03319 Cellulitis of trunk, unspecified: Secondary | ICD-10-CM | POA: Diagnosis present

## 2012-10-18 DIAGNOSIS — S78119A Complete traumatic amputation at level between unspecified hip and knee, initial encounter: Secondary | ICD-10-CM

## 2012-10-18 DIAGNOSIS — L0390 Cellulitis, unspecified: Secondary | ICD-10-CM

## 2012-10-18 DIAGNOSIS — I5031 Acute diastolic (congestive) heart failure: Secondary | ICD-10-CM | POA: Diagnosis present

## 2012-10-18 DIAGNOSIS — D72829 Elevated white blood cell count, unspecified: Secondary | ICD-10-CM | POA: Diagnosis present

## 2012-10-18 DIAGNOSIS — I10 Essential (primary) hypertension: Secondary | ICD-10-CM | POA: Diagnosis present

## 2012-10-18 DIAGNOSIS — Z79899 Other long term (current) drug therapy: Secondary | ICD-10-CM

## 2012-10-18 DIAGNOSIS — I2510 Atherosclerotic heart disease of native coronary artery without angina pectoris: Secondary | ICD-10-CM | POA: Diagnosis present

## 2012-10-18 DIAGNOSIS — Z8673 Personal history of transient ischemic attack (TIA), and cerebral infarction without residual deficits: Secondary | ICD-10-CM

## 2012-10-18 DIAGNOSIS — I5032 Chronic diastolic (congestive) heart failure: Secondary | ICD-10-CM

## 2012-10-18 DIAGNOSIS — I5033 Acute on chronic diastolic (congestive) heart failure: Secondary | ICD-10-CM

## 2012-10-18 DIAGNOSIS — C8400 Mycosis fungoides, unspecified site: Secondary | ICD-10-CM | POA: Diagnosis present

## 2012-10-18 DIAGNOSIS — L02219 Cutaneous abscess of trunk, unspecified: Secondary | ICD-10-CM | POA: Diagnosis present

## 2012-10-18 DIAGNOSIS — Z7982 Long term (current) use of aspirin: Secondary | ICD-10-CM

## 2012-10-18 DIAGNOSIS — L8990 Pressure ulcer of unspecified site, unspecified stage: Secondary | ICD-10-CM | POA: Diagnosis present

## 2012-10-18 DIAGNOSIS — I509 Heart failure, unspecified: Secondary | ICD-10-CM | POA: Diagnosis present

## 2012-10-18 DIAGNOSIS — L02419 Cutaneous abscess of limb, unspecified: Secondary | ICD-10-CM | POA: Diagnosis present

## 2012-10-18 DIAGNOSIS — R0602 Shortness of breath: Secondary | ICD-10-CM

## 2012-10-18 DIAGNOSIS — L03119 Cellulitis of unspecified part of limb: Secondary | ICD-10-CM | POA: Diagnosis present

## 2012-10-18 DIAGNOSIS — D649 Anemia, unspecified: Secondary | ICD-10-CM

## 2012-10-18 DIAGNOSIS — Z8701 Personal history of pneumonia (recurrent): Secondary | ICD-10-CM

## 2012-10-18 DIAGNOSIS — I4891 Unspecified atrial fibrillation: Secondary | ICD-10-CM | POA: Diagnosis present

## 2012-10-18 LAB — URINALYSIS, ROUTINE W REFLEX MICROSCOPIC
Glucose, UA: NEGATIVE mg/dL
Leukocytes, UA: NEGATIVE
Nitrite: NEGATIVE
Protein, ur: NEGATIVE mg/dL

## 2012-10-18 LAB — POCT I-STAT, CHEM 8
Calcium, Ion: 1.11 mmol/L — ABNORMAL LOW (ref 1.13–1.30)
Creatinine, Ser: 0.8 mg/dL (ref 0.50–1.10)
Glucose, Bld: 120 mg/dL — ABNORMAL HIGH (ref 70–99)
HCT: 31 % — ABNORMAL LOW (ref 36.0–46.0)
Hemoglobin: 10.5 g/dL — ABNORMAL LOW (ref 12.0–15.0)
Potassium: 4.7 mEq/L (ref 3.5–5.1)
TCO2: 28 mmol/L (ref 0–100)

## 2012-10-18 LAB — COMPREHENSIVE METABOLIC PANEL
ALT: 16 U/L (ref 0–35)
AST: 28 U/L (ref 0–37)
Calcium: 8.8 mg/dL (ref 8.4–10.5)
Creatinine, Ser: 0.62 mg/dL (ref 0.50–1.10)
GFR calc Af Amer: 89 mL/min — ABNORMAL LOW (ref >90)
Sodium: 131 mEq/L — ABNORMAL LOW (ref 135–145)
Total Protein: 5.9 g/dL — ABNORMAL LOW (ref 6.0–8.3)

## 2012-10-18 LAB — CBC WITH DIFFERENTIAL/PLATELET
Eosinophils Absolute: 0.5 10*3/uL (ref 0.0–0.7)
Lymphs Abs: 13.5 10*3/uL — ABNORMAL HIGH (ref 0.7–4.0)
MCH: 28.5 pg (ref 26.0–34.0)
MCHC: 32.5 g/dL (ref 30.0–36.0)
Monocytes Absolute: 0.7 10*3/uL (ref 0.1–1.0)
Neutrophils Relative %: 37 % — ABNORMAL LOW (ref 43–77)
Platelets: 542 10*3/uL — ABNORMAL HIGH (ref 150–400)
RDW: 15.1 % (ref 11.5–15.5)

## 2012-10-18 LAB — PRO B NATRIURETIC PEPTIDE: Pro B Natriuretic peptide (BNP): 4481 pg/mL — ABNORMAL HIGH (ref 0–450)

## 2012-10-18 LAB — PROTIME-INR: Prothrombin Time: 13.5 seconds (ref 11.6–15.2)

## 2012-10-18 MED ORDER — IOHEXOL 350 MG/ML SOLN
100.0000 mL | Freq: Once | INTRAVENOUS | Status: AC | PRN
Start: 2012-10-18 — End: 2012-10-18
  Administered 2012-10-18: 100 mL via INTRAVENOUS

## 2012-10-18 MED ORDER — FUROSEMIDE 10 MG/ML IJ SOLN
40.0000 mg | Freq: Once | INTRAMUSCULAR | Status: AC
  Administered 2012-10-18: 40 mg via INTRAVENOUS
  Filled 2012-10-18: qty 4

## 2012-10-18 NOTE — ED Notes (Addendum)
Skin breakdown noted inside vaginal labia, angry red color. Pt skin red on buttock, and back.

## 2012-10-18 NOTE — ED Provider Notes (Signed)
Sara [name] S 8:00 PM patient discussed in sign out with Dr. Manus Gunning. Patient likely will need admission for increase shortness of breath. There is possibilities for PE CT scan pending. Patient also with some signs of increased fluid retention on exam. Symptoms may be related to CHF.   CT scan does not show PE or significant edema. I have ordered Lasix for symptoms.  10:20PM  Spoke with Dr. Julian Reil with [facility]. He agrees to come and evaluate patient for possible admission. Patient is currently residing in a skilled nursing facility.  Dr. Lanae Boast has seen patient and will make decision on admission or discharge home.  Angus Seller, PA 10/19/12 0157

## 2012-10-18 NOTE — ED Notes (Signed)
Pt arrived from [facility] via [facility] c/o high D-Dimer, SOB. EMS initial O2 saturation 95% on RA, Than 98% on [place] 2 L/min. Hx: A-Fib, CHF, Stroke (march), and Rt side AKA (rercent on 27th). Fragile with skin tears bilateral forearms.

## 2012-10-18 NOTE — ED Provider Notes (Signed)
History     CSN: [id number]  Arrival date & time 10/18/12  1913   First MD Initiated Contact with Patient 10/18/12 1938      Chief Complaint  Patient presents with  . Medical Clearance    (Consider location/radiation/quality/duration/timing/severity/associated sxs/prior treatment) HPI Comments: Patient presents from nursing home with shortness of breath for the past 2 days with reportedly elevated d-dimer. Her history is remarkable for recent AKA on January 27 by Dr. Lajoyce Corners. PMH of cutaneous T-cell lymphoma/mycosis fungoidis, psoriasis, nonhealing wound right lateral malleolus, chronic A. fib, hypertension, CAD, chronic diastolic CHF, prior CVAs, HL. Patient denies cough, fever or chest pain. She states she's felt subjectively warm. He is not ambulatory and not using a wheel chair. He speaking in short sentences and tachypnea.  The history is provided by the patient and the EMS personnel.    Past Medical History  Diagnosis Date  . Hypertension   . Atrial fibrillation   . Psoriasis   . Congestive heart failure (CHF)   . History of CVA (cerebrovascular accident)   . Coronary artery disease   . Myocardial infarction   . CHF (congestive heart failure)   . Stroke   . Shortness of breath   . H/O hiatal hernia   . Closed fracture of head of left humerus 5/12  . Basal cell carcinoma   . 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  Flow cytometry 04/04/12: 91% cells CD4 positive CD 26 negative  . Hyperlipemia   . Pneumonia   . Wound of right leg 09/25/2012    Necrotic, open wound right lower leg; non healing    Past Surgical History  Procedure Date  . Tonsillectomy   . Dilation and curettage of uterus   . Eye surgery   . Cataracts   . Vertebroplasty   . Amputation 10/10/2012    Procedure: AMPUTATION ABOVE KNEE;  Surgeon: Nadara Mustard, MD;   Location: [facility] OR;  Service: Orthopedics;  Laterality: Right;    Family History  Problem Relation Age of Onset  . Stomach cancer Mother   . Pneumonia Father     History  Substance Use Topics  . Smoking status: Never Smoker   . Smokeless tobacco: Never Used  . Alcohol Use: No    OB History    Grav Para Term Preterm Abortions TAB SAB Ect Mult Living                  Review of Systems  Constitutional: Positive for activity change, appetite change and fatigue. Negative for fever.  HENT: Negative for congestion.   Respiratory: Positive for shortness of breath. Negative for cough and chest tightness.   Cardiovascular: Negative for chest pain.  Gastrointestinal: Negative for nausea, vomiting and abdominal pain.  Genitourinary: Negative for dysuria and hematuria.  Skin: Positive for rash.  Neurological: Positive for weakness. Negative for headaches.  A complete 10 system review of systems was obtained and all systems are negative except as noted in the HPI and PMH.    Allergies  Penicillins; Triamterene; and Alendronate sodium  Home Medications   Current Outpatient Rx  Name  Route  Sig  Dispense  Refill  . ACETAMINOPHEN 325 MG PO TABS   Oral   Take 2 tablets (650 mg total) by mouth every 4 (four) hours as needed (temperature >/= 99.5 F).  30 tablet   0   . ASPIRIN 81 MG PO CHEW   Oral   Chew 81 mg by mouth daily.         Marland Kitchen BEXAROTENE 75 MG PO CAPS   Oral   Take 75 mg by mouth daily before supper. Give with food. Protect from light. CAUTION: Chemotherapy/Biotherapy         . CARVEDILOL 12.5 MG PO TABS   Oral   Take 12.5 mg by mouth 2 (two) times daily with a meal.         . CLOPIDOGREL BISULFATE 75 MG PO TABS   Oral   Take 75 mg by mouth daily.         . CO Q 10 100 MG PO CAPS   Oral   Take 1 capsule by mouth daily.         Marland Kitchen DILTIAZEM HCL ER COATED BEADS 120 MG PO CP24   Oral   Take 120 mg by mouth daily.         Marland Kitchen DOCUSATE SODIUM 100 MG PO  CAPS   Oral   Take 100 mg by mouth daily. For constipation         . FERROUS SULFATE 325 (65 FE) MG PO TABS   Oral   Take 325 mg by mouth 2 (two) times daily.         Marland Kitchen FOLIC ACID 1 MG PO TABS   Oral   Take 1 mg by mouth daily.         . FUROSEMIDE 40 MG PO TABS   Oral   Take 40 mg by mouth every morning.         Marland Kitchen HYDROCORTISONE 1 % EX CREA   Topical   Apply 1 application topically 2 (two) times daily. To flaking skin and rash         . HYDROXYZINE HCL 10 MG PO TABS   Oral   Take 10 mg by mouth at bedtime.          Marland Kitchen LISINOPRIL 5 MG PO TABS   Oral   Take 1 tablet (5 mg total) by mouth daily.   30 tablet   2   . NITROGLYCERIN 0.4 MG SL SUBL   Sublingual   Place 0.4 mg under the tongue every 5 (five) minutes as needed. For chest pain         . FISH OIL 1000 MG PO CAPS   Oral   Take 1,000 mg by mouth at bedtime.          . OXYCODONE HCL 5 MG PO TABS   Oral   Take 1-2 tablets (5-10 mg total) by mouth every 4 (four) hours as needed. Take 1 tablet for moderate pain and 2 tablets for severe pain   30 tablet   0   . PANTOPRAZOLE SODIUM 40 MG PO TBEC   Oral   Take 40 mg by mouth daily.         Marland Kitchen POLYETHYLENE GLYCOL 3350 PO PACK   Oral   Take 17 g by mouth daily.         Scherrie November PO POWD   Oral   Take 2 scoop by mouth 2 (two) times daily. In beverage         . VITAMIN C 500 MG PO TABS   Oral   Take 500 mg by mouth at bedtime.            BP  135/58  Pulse 68  Temp 98 F (36.7 C) (Oral)  SpO2 98%  Physical Exam  Constitutional: She is oriented to person, place, and time. She appears well-developed and well-nourished. She appears distressed.       Mild respiratory distress, tachypnea, speaking in short sentences  HENT:  Head: Normocephalic and atraumatic.  Mouth/Throat: Oropharynx is clear and moist. No oropharyngeal exudate.  Eyes: Conjunctivae normal and EOM are normal. Pupils are equal, round, and reactive to light.  Neck:  Normal range of motion. Neck supple.  Cardiovascular: Normal rate, regular rhythm and normal heart sounds.   No murmur heard. Pulmonary/Chest: Breath sounds normal. She is in respiratory distress.  Abdominal: Soft. There is no tenderness. There is no rebound and no guarding.  Musculoskeletal: She exhibits tenderness. She exhibits no edema.       Right AKA site without bleeding or drainage. No fluctuance.  Neurological: She is alert and oriented to person, place, and time. Coordination normal.  Skin: Skin is warm. Rash noted.       Diffuse erythematous rash to back, bilateral buttocks    ED Course  Procedures (including critical care time)   Labs Reviewed  CBC WITH DIFFERENTIAL  COMPREHENSIVE METABOLIC PANEL  TROPONIN I  PRO B NATRIURETIC PEPTIDE  URINALYSIS, ROUTINE W REFLEX MICROSCOPIC  PROTIME-INR   No results found.   No diagnosis found.    MDM  Patient from nursing home with 2 day history of shortness of breath and elevated d-dimer. Oxygenation 95% on room air the patient is tachypneic and speaking in short sentences. Recent AKA with history of CHF and multiple strokes.  CXR with possible subtle infiltrate. No fever.  Chronic leukocytosis 2/2 lymphoma.  BNP elevated, bibasilar crackles with increased work of breathing. CTPE pending at time of sign out to PA Dammen. Anticipate admission for dyspnea 2/2 CHF and possible PNA>  R/o PE>     Date: 10/18/2012  Rate: 64  Rhythm: normal sinus rhythm  QRS Axis: normal  Intervals: normal  ST/T Wave abnormalities: normal  Conduction Disutrbances:none  Narrative Interpretation:   Old EKG Reviewed: unchanged    Glynn Octave, MD 10/18/12 2241

## 2012-10-19 ENCOUNTER — Encounter (HOSPITAL_COMMUNITY): Payer: Self-pay | Admitting: Emergency Medicine

## 2012-10-19 DIAGNOSIS — I5033 Acute on chronic diastolic (congestive) heart failure: Principal | ICD-10-CM

## 2012-10-19 DIAGNOSIS — I509 Heart failure, unspecified: Secondary | ICD-10-CM

## 2012-10-19 DIAGNOSIS — R0602 Shortness of breath: Secondary | ICD-10-CM

## 2012-10-19 DIAGNOSIS — D72829 Elevated white blood cell count, unspecified: Secondary | ICD-10-CM

## 2012-10-19 DIAGNOSIS — C8404 Mycosis fungoides, lymph nodes of axilla and upper limb: Secondary | ICD-10-CM

## 2012-10-19 LAB — CBC WITH DIFFERENTIAL/PLATELET
Basophils Absolute: 0 10*3/uL (ref 0.0–0.1)
HCT: 32.7 % — ABNORMAL LOW (ref 36.0–46.0)
Lymphocytes Relative: 57 % — ABNORMAL HIGH (ref 12–46)
Monocytes Relative: 4 % (ref 3–12)
Neutrophils Relative %: 37 % — ABNORMAL LOW (ref 43–77)
Platelets: 549 10*3/uL — ABNORMAL HIGH (ref 150–400)
RDW: 15.4 % (ref 11.5–15.5)
WBC: 24.4 10*3/uL — ABNORMAL HIGH (ref 4.0–10.5)

## 2012-10-19 LAB — BASIC METABOLIC PANEL
Calcium: 8.8 mg/dL (ref 8.4–10.5)
Creatinine, Ser: 0.54 mg/dL (ref 0.50–1.10)
GFR calc Af Amer: >90 mL/min (ref >90)

## 2012-10-19 LAB — MRSA PCR SCREENING: MRSA by PCR: POSITIVE — AB

## 2012-10-19 LAB — PATHOLOGIST SMEAR REVIEW

## 2012-10-19 LAB — TROPONIN I
Troponin I: <0.3 ng/mL (ref <0.30)
Troponin I: <0.3 ng/mL (ref <0.30)

## 2012-10-19 MED ORDER — CARVEDILOL 12.5 MG PO TABS
12.5000 mg | ORAL_TABLET | Freq: Two times a day (BID) | ORAL | Status: DC
  Administered 2012-10-19 – 2012-10-21 (×5): 12.5 mg via ORAL
  Filled 2012-10-19 (×9): qty 1

## 2012-10-19 MED ORDER — FERROUS SULFATE 325 (65 FE) MG PO TABS
325.0000 mg | ORAL_TABLET | Freq: Two times a day (BID) | ORAL | Status: DC
  Administered 2012-10-19 – 2012-10-21 (×5): 325 mg via ORAL
  Filled 2012-10-19 (×8): qty 1

## 2012-10-19 MED ORDER — HEPARIN SODIUM (PORCINE) 5000 UNIT/ML IJ SOLN
5000.0000 [IU] | Freq: Three times a day (TID) | INTRAMUSCULAR | Status: DC
  Administered 2012-10-19 – 2012-10-21 (×7): 5000 [IU] via SUBCUTANEOUS
  Filled 2012-10-19 (×10): qty 1

## 2012-10-19 MED ORDER — BEXAROTENE 75 MG PO CAPS
75.0000 mg | ORAL_CAPSULE | Freq: Every day | ORAL | Status: DC
  Administered 2012-10-19 – 2012-10-20 (×2): 75 mg via ORAL
  Filled 2012-10-19 (×2): qty 1

## 2012-10-19 MED ORDER — ONDANSETRON HCL 4 MG/2ML IJ SOLN: 4.0000 mg | Freq: Four times a day (QID) | INTRAMUSCULAR | Status: DC | PRN

## 2012-10-19 MED ORDER — SODIUM CHLORIDE 0.9 % IJ SOLN: 3.0000 mL | INTRAMUSCULAR | Status: DC | PRN

## 2012-10-19 MED ORDER — HYDROXYZINE HCL 10 MG PO TABS
10.0000 mg | ORAL_TABLET | Freq: Every day | ORAL | Status: DC
  Administered 2012-10-19 – 2012-10-20 (×2): 10 mg via ORAL
  Filled 2012-10-19 (×3): qty 1

## 2012-10-19 MED ORDER — ACETAMINOPHEN 325 MG PO TABS: 650.0000 mg | ORAL_TABLET | ORAL | Status: DC | PRN

## 2012-10-19 MED ORDER — LISINOPRIL 5 MG PO TABS
5.0000 mg | ORAL_TABLET | Freq: Every day | ORAL | Status: DC
  Administered 2012-10-19 – 2012-10-20 (×2): 5 mg via ORAL
  Filled 2012-10-19 (×4): qty 1

## 2012-10-19 MED ORDER — MUPIROCIN 2 % EX OINT
1.0000 "application " | TOPICAL_OINTMENT | Freq: Two times a day (BID) | CUTANEOUS | Status: DC
  Administered 2012-10-19 – 2012-10-21 (×5): 1 via NASAL
  Filled 2012-10-19 (×2): qty 22

## 2012-10-19 MED ORDER — OXYCODONE HCL 5 MG PO TABS: 5.0000 mg | ORAL_TABLET | ORAL | Status: DC | PRN

## 2012-10-19 MED ORDER — HEPARIN SODIUM (PORCINE) 5000 UNIT/ML IJ SOLN
5000.0000 [IU] | Freq: Three times a day (TID) | INTRAMUSCULAR | Status: DC
  Filled 2012-10-19: qty 1

## 2012-10-19 MED ORDER — PANTOPRAZOLE SODIUM 40 MG PO TBEC
40.0000 mg | DELAYED_RELEASE_TABLET | Freq: Every day | ORAL | Status: DC
  Administered 2012-10-19 – 2012-10-21 (×3): 40 mg via ORAL
  Filled 2012-10-19: qty 1

## 2012-10-19 MED ORDER — POLYETHYLENE GLYCOL 3350 17 G PO PACK
17.0000 g | PACK | Freq: Every day | ORAL | Status: DC
  Administered 2012-10-19 – 2012-10-21 (×3): 17 g via ORAL
  Filled 2012-10-19 (×3): qty 1

## 2012-10-19 MED ORDER — CO Q 10 100 MG PO CAPS: 1.0000 | ORAL_CAPSULE | Freq: Every day | ORAL | Status: DC

## 2012-10-19 MED ORDER — SODIUM CHLORIDE 0.9 % IV SOLN
250.0000 mL | INTRAVENOUS | Status: DC | PRN
  Administered 2012-10-19: 250 mL via INTRAVENOUS

## 2012-10-19 MED ORDER — DOCUSATE SODIUM 100 MG PO CAPS
100.0000 mg | ORAL_CAPSULE | Freq: Every day | ORAL | Status: DC
  Administered 2012-10-19 – 2012-10-21 (×3): 100 mg via ORAL
  Filled 2012-10-19 (×3): qty 1

## 2012-10-19 MED ORDER — SODIUM CHLORIDE 0.9 % IJ SOLN
3.0000 mL | Freq: Two times a day (BID) | INTRAMUSCULAR | Status: DC
  Administered 2012-10-19 – 2012-10-21 (×4): 3 mL via INTRAVENOUS

## 2012-10-19 MED ORDER — BIOTENE DRY MOUTH MT LIQD
15.0000 mL | Freq: Two times a day (BID) | OROMUCOSAL | Status: DC
  Administered 2012-10-19 – 2012-10-21 (×5): 15 mL via OROMUCOSAL

## 2012-10-19 MED ORDER — CLOPIDOGREL BISULFATE 75 MG PO TABS
75.0000 mg | ORAL_TABLET | Freq: Every day | ORAL | Status: DC
  Administered 2012-10-19 – 2012-10-21 (×3): 75 mg via ORAL
  Filled 2012-10-19 (×3): qty 1

## 2012-10-19 MED ORDER — FLUCONAZOLE 100 MG PO TABS
100.0000 mg | ORAL_TABLET | Freq: Every day | ORAL | Status: DC
  Administered 2012-10-19 – 2012-10-21 (×3): 100 mg via ORAL
  Filled 2012-10-19 (×3): qty 1

## 2012-10-19 MED ORDER — BENEPROTEIN PO POWD
2.0000 | Freq: Two times a day (BID) | ORAL | Status: DC
  Administered 2012-10-19 – 2012-10-21 (×5): 12 g via ORAL
  Filled 2012-10-19: qty 227

## 2012-10-19 MED ORDER — NYSTATIN 100000 UNIT/GM EX POWD
Freq: Two times a day (BID) | CUTANEOUS | Status: DC
  Administered 2012-10-19 – 2012-10-21 (×4): via TOPICAL
  Filled 2012-10-19: qty 15

## 2012-10-19 MED ORDER — CHLORHEXIDINE GLUCONATE CLOTH 2 % EX PADS
6.0000 | MEDICATED_PAD | Freq: Every day | CUTANEOUS | Status: DC
  Administered 2012-10-19 – 2012-10-21 (×3): 6 via TOPICAL

## 2012-10-19 MED ORDER — FUROSEMIDE 10 MG/ML IJ SOLN
40.0000 mg | Freq: Two times a day (BID) | INTRAMUSCULAR | Status: DC
  Administered 2012-10-19 – 2012-10-20 (×4): 40 mg via INTRAVENOUS
  Filled 2012-10-19 (×7): qty 4

## 2012-10-19 MED ORDER — FOLIC ACID 1 MG PO TABS
1.0000 mg | ORAL_TABLET | Freq: Every day | ORAL | Status: DC
  Administered 2012-10-19 – 2012-10-20 (×2): 1 mg via ORAL
  Filled 2012-10-19 (×3): qty 1

## 2012-10-19 MED ORDER — ASPIRIN 81 MG PO CHEW
81.0000 mg | CHEWABLE_TABLET | Freq: Every day | ORAL | Status: DC
  Administered 2012-10-19 – 2012-10-21 (×3): 81 mg via ORAL
  Filled 2012-10-19 (×3): qty 1

## 2012-10-19 MED ORDER — DOXYCYCLINE HYCLATE 100 MG PO TABS
100.0000 mg | ORAL_TABLET | Freq: Two times a day (BID) | ORAL | Status: DC
  Administered 2012-10-19 – 2012-10-21 (×4): 100 mg via ORAL
  Filled 2012-10-19 (×5): qty 1

## 2012-10-19 MED ORDER — DILTIAZEM HCL ER COATED BEADS 120 MG PO CP24
120.0000 mg | ORAL_CAPSULE | Freq: Every day | ORAL | Status: DC
  Administered 2012-10-19 – 2012-10-21 (×3): 120 mg via ORAL
  Filled 2012-10-19 (×3): qty 1

## 2012-10-19 MED ORDER — VITAMIN C 500 MG PO TABS
500.0000 mg | ORAL_TABLET | Freq: Every day | ORAL | Status: DC
  Administered 2012-10-19 – 2012-10-20 (×2): 500 mg via ORAL
  Filled 2012-10-19 (×4): qty 1

## 2012-10-19 MED ORDER — CLOPIDOGREL BISULFATE 75 MG PO TABS: 75.0000 mg | ORAL_TABLET | Freq: Every day | ORAL | Status: DC

## 2012-10-19 NOTE — H&P (Signed)
[facility] History and Physical  MAYMUNA DETZEL ZOX:[id number] DOB: [birth year] DOA: 10/18/2012  Referring physician: ED PCP: Londell Moh, MD  Specialists: Irma Newness  Chief Complaint: Dyspnea  HPI: Sara [name] is a 79 y.o. female who presents with dyspnea, onset 2 days ago at her nursing home.  She also reportedly had an elevated D-Dimer they checked at the NH, her history is remarkable for a recent AKA on Jan 27 by Dr. Lajoyce Corners for a nonhealing wound on her R lateral malleolus.  She also has a prior history of diastolic CHF with preserved EF, most recent echo on 10/06/12.  In the ED, CTA chest was negative for PE, did not demonstrate PNA, but did demonstrate mild B pleural effusions suggestive of CHF exacerbation.  Her BNP was also noted to be elevated.  She does have a leukocytosis of 23k, but this is consistent with all her recent CBCs and she has had some degree of leukocytosis for at least the past year.  40 Lasix given in ED and Hospitalist has been asked to admit.  Review of Systems: Positive for mild fever, severe skin breakdown in pelvic area, 12 systems reviewed and otherwise negative.  Past Medical History  Diagnosis Date  . Hypertension   . Atrial fibrillation   . Psoriasis   . Congestive heart failure (CHF)   . History of CVA (cerebrovascular accident)   . Coronary artery disease   . Myocardial infarction   . CHF (congestive heart failure)   . Stroke   . Shortness of breath   . H/O hiatal hernia   . Closed fracture of head of left humerus 5/12  . Basal cell carcinoma   . 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  Flow cytometry 04/04/12: 91% cells CD4 positive CD 26 negative  . Hyperlipemia   . Pneumonia   . Wound of right leg 09/25/2012    Necrotic, open wound right lower leg; non healing   Past Surgical History   Procedure Date  . Tonsillectomy   . Dilation and curettage of uterus   . Eye surgery   . Cataracts   . Vertebroplasty   . Amputation 10/10/2012    Procedure: AMPUTATION ABOVE KNEE;  Surgeon: Nadara Mustard, MD;  Location: [facility] OR;  Service: Orthopedics;  Laterality: Right;   Social History:  reports that she has never smoked. She has never used smokeless tobacco. She reports that she does not drink alcohol or use illicit drugs.   Allergies  Allergen Reactions  . Penicillins Other (See Comments)    unknown  . Triamterene Other (See Comments)    unknown  . Alendronate Sodium Rash    Family History  Problem Relation Age of Onset  . Stomach cancer Mother   . Pneumonia Father      Prior to Admission medications   Medication Sig Start Date End Date Taking? Authorizing Provider  acetaminophen (TYLENOL) 325 MG tablet Take 2 tablets (650 mg total) by mouth every 4 (four) hours as needed (temperature >/= 99.5 F). 10/11/12  Yes Meredeth Ide, MD  aspirin 81 MG chewable tablet Chew 81 mg by mouth daily.   Yes Historical Provider, MD  bexarotene (TARGRETIN) 75 MG CAPS capsule Take 75 mg by mouth daily before supper. Give with food. Protect from light. CAUTION: Chemotherapy/Biotherapy 09/25/12  Yes Historical Provider, MD  carvedilol (  COREG) 12.5 MG tablet Take 12.5 mg by mouth 2 (two) times daily with a meal.   Yes Historical Provider, MD  clopidogrel (PLAVIX) 75 MG tablet Take 75 mg by mouth daily.   Yes Historical Provider, MD  Coenzyme Q10 (CO Q 10) 100 MG CAPS Take 1 capsule by mouth daily.   Yes Historical Provider, MD  diltiazem (CARDIZEM CD) 120 MG 24 hr capsule Take 120 mg by mouth daily.   Yes Historical Provider, MD  docusate sodium (COLACE) 100 MG capsule Take 100 mg by mouth daily. For constipation   Yes Historical Provider, MD  ferrous sulfate 325 (65 FE) MG tablet Take 325 mg by mouth 2 (two) times daily.   Yes Historical Provider, MD  folic acid (FOLVITE) 1 MG tablet Take 1 mg by  mouth daily.   Yes Historical Provider, MD  furosemide (LASIX) 40 MG tablet Take 40 mg by mouth every morning.   Yes Historical Provider, MD  hydrocortisone cream 1 % Apply 1 application topically 2 (two) times daily. To flaking skin and rash   Yes Historical Provider, MD  hydrOXYzine (ATARAX/VISTARIL) 10 MG tablet Take 10 mg by mouth at bedtime.    Yes Historical Provider, MD  lisinopril (PRINIVIL,ZESTRIL) 5 MG tablet Take 1 tablet (5 mg total) by mouth daily. 10/11/12  Yes Meredeth Ide, MD  nitroGLYCERIN (NITROSTAT) 0.4 MG SL tablet Place 0.4 mg under the tongue every 5 (five) minutes as needed. For chest pain   Yes Historical Provider, MD  Omega-3 Fatty Acids (FISH OIL) 1000 MG CAPS Take 1,000 mg by mouth at bedtime.    Yes Historical Provider, MD  oxyCODONE (OXY IR/ROXICODONE) 5 MG immediate release tablet Take 1-2 tablets (5-10 mg total) by mouth every 4 (four) hours as needed. Take 1 tablet for moderate pain and 2 tablets for severe pain 10/11/12  Yes Meredeth Ide, MD  pantoprazole (PROTONIX) 40 MG tablet Take 40 mg by mouth daily.   Yes Historical Provider, MD  polyethylene glycol (MIRALAX / GLYCOLAX) packet Take 17 g by mouth daily.   Yes Historical Provider, MD  protein supplement (RESOURCE BENEPROTEIN) POWD Take 2 scoop by mouth 2 (two) times daily. In beverage   Yes Historical Provider, MD  vitamin C (ASCORBIC ACID) 500 MG tablet Take 500 mg by mouth at bedtime.    Yes Historical Provider, MD   Physical Exam: Filed Vitals:   10/18/12 1937 10/18/12 2215  BP: 135/58 138/75  Pulse: 68 64  Temp: 98 F (36.7 C)   TempSrc: Oral   Resp:  20  SpO2: 98% 99%     General:  NAD, resting comfortably in bed  Eyes: PEERLA EOMI  ENT: mucous membranes moist  Neck: supple w/o JVD  Cardiovascular: RRR w/o MRG  Respiratory: CTA B  Abdomen: soft, nt, nd, bs+  Skin: no rash nor lesion  Musculoskeletal: MAE, full ROM all 4 extremities  Psychiatric: normal tone and affect  Neurologic:  AAOx3, grossly non-focal   Labs on Admission:  Basic Metabolic Panel:  Lab 10/18/12 1610 10/18/12 1929  NA 134* 131*  K 4.7 4.7  CL 100 97  CO2 -- 24  GLUCOSE 120* 119*  BUN 28* 24*  CREATININE 0.80 0.62  CALCIUM -- 8.8  MG -- --  PHOS -- --   Liver Function Tests:  Lab 10/18/12 1929  AST 28  ALT 16  ALKPHOS 48  BILITOT 0.2*  PROT 5.9*  ALBUMIN 2.3*   No results found for this  basename: LIPASE:5,AMYLASE:5 in the last 168 hours No results found for this basename: AMMONIA:5 in the last 168 hours CBC:  Lab 10/18/12 2012 10/18/12 1929  WBC -- 23.6*  NEUTROABS -- 8.7*  HGB 10.5* 9.9*  HCT 31.0* 30.5*  MCV -- 87.9  PLT -- 542*   Cardiac Enzymes:  Lab 10/18/12 2010  CKTOTAL --  CKMB --  CKMBINDEX --  TROPONINI <0.30    BNP (last 3 results)  Basename 10/18/12 2010 10/05/12 1545 03/24/12 0400  PROBNP 4481.0* 3200.0* 2150.0*   CBG: No results found for this basename: GLUCAP:5 in the last 168 hours  Radiological Exams on Admission: Ct Angio Chest Pe W/cm &/or Wo Cm  10/18/2012  *RADIOLOGY REPORT*  Clinical Data: Shortness of breath beginning after recent surgery.  CT ANGIOGRAPHY CHEST  Technique:  Multidetector CT imaging of the chest using the standard protocol during bolus administration of intravenous contrast. Multiplanar reconstructed images including MIPs were obtained and reviewed to evaluate the vascular anatomy.  Contrast: OMNIPAQUE IOHEXOL 350 MG/ML SOLN  Comparison: 06/02/2012  Findings: Technically adequate study with good opacification of the central and segmental pulmonary arteries.  No focal filling defects.  No evidence of significant pulmonary embolus.  Normal heart size.  Calcification in the mitral valve annulus and aortic valve.  Calcification in the coronary arteries.  Calcification in the thoracic aorta.  Normal caliber thoracic aorta.  Prominent but nonpathologically enlarged lymph nodes are demonstrated throughout the mediastinum and in  both axilla.  This is stable since the previous study in these are likely reactive.  The esophagus is decompressed.  Visualized portions of the upper abdominal organs are unremarkable except for vascular calcifications which are extensive.  There are small bilateral pleural effusions with mild bilateral basilar atelectasis.  Visualization of the lung parenchyma is limited due to respiratory motion artifact but there is no specific consolidation or airspace disease noted.  Bronchial wall thickening suggesting chronic bronchitis.  Airways appear patent.  No pneumothorax.  Degenerative changes throughout the thoracic spine. Normal alignment.  Compression of T12 post kyphoplasty.  IMPRESSION: No evidence of significant pulmonary embolus.  Small bilateral pleural effusions with basilar atelectasis.  Extensive vascular calcifications.   Original Report Authenticated By: Burman Nieves, M.D.    Dg Chest Portable 1 View  10/18/2012  *RADIOLOGY REPORT*  Clinical Data: Shortness of breath.  PORTABLE CHEST - 1 VIEW  Comparison: 10/05/2012 chest x-ray.  06/02/2012 chest CT.  Findings: Hazy opacity right infrahilar region may represent overlapping structures.  Subtle right infrahilar infiltrate not entirely excluded.  Central pulmonary vascular prominence.  No pulmonary edema or gross pneumothorax.  Calcified aorta.  Heart size within normal limits.  CT detected tiny noncalcified nodules not appreciated on this present plain film exam.  IMPRESSION: Hazy opacity right infrahilar region may represent overlapping structures.  Subtle right infrahilar infiltrate not entirely excluded.   Original Report Authenticated By: Lacy Duverney, M.D.     EKG: Independently reviewed.  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 Acute on  chronic diastolic CHF - pleural effusions noted on CT scan, lasix 40 in ED, also ordering lasix 40mg  BID instead of her 40 daily home dose.  Admit to obs, getting serial troponins but doubt this represents an acute cardiac event.  No evidence of PE on CTA, no evidence of PNA on  CTA. 2. Leukocytosis - only SIRS criteria in ED and most (if not all of this) is demonstrateably chronic from her T cell lymphoma (weekly CBCs for at least a year). 3. Skin breakdown - secondary to pressure sores, recent surgery, bed bound status, and cutaneous T-Cell lymphoma, getting wound care specialist involved to evaluate and treat. 4. DVT ppx - putting patient on [place] heparin, family is wondering if she should be on chronic ppx given that she is not walking.  Actually given the history of A.Fib and stroke, if she were felt to no longer be a fall risk, she may be appropriate for coumadin, will defer this decision to her PCP at this time.    Code Status: DNR (must indicate code status--if unknown or must be presumed, indicate so) Family Communication: Spoke with daughter at bedside (indicate person spoken with, if applicable, with phone number if by telephone) Disposition Plan: admit to obs (indicate anticipated LOS)  Time spent: 70 min  Mekiah Wahler M. [facility] Pager [phone]  If 7PM-7AM, please contact night-coverage [email or web address] Password [email or web address] 10/19/2012, 12:24 AM

## 2012-10-19 NOTE — Progress Notes (Signed)
Clinical Social Work Department BRIEF PSYCHOSOCIAL ASSESSMENT 10/19/2012  Patient:  Sara [name], Sara [name]     Account Number:  [id number]     Admit date:  10/18/2012  Clinical Social Worker:  Robin Searing  Date/Time:  10/19/2012 11:58 AM  Referred by:  Physician  Date Referred:  10/19/2012 Referred for  SNF Placement   Other Referral:   Interview type:  Other - See comment Other interview type:   Harriett Sine- daughter [phone]    PSYCHOSOCIAL DATA Living Status:  FACILITY Admitted from facility:  [facility] Level of care:  Skilled Nursing Facility Primary support name:  daughter Primary support relationship to patient:  FAMILY Degree of support available:   good    CURRENT CONCERNS Current Concerns  Post-Acute Placement   Other Concerns:    SOCIAL WORK ASSESSMENT / PLAN Patient admitted from [facility] where she has lived in Independent Living until recently when she required an amputation and has been in their SNF unit- patient plans return to SNF at d/c- confirmed bed hold with SNF   Assessment/plan status:  Other - See comment Other assessment/ plan:   update FL2   Information/referral to community resources:   SNF  EMS    PATIENT'S/FAMILY'S RESPONSE TO PLAN OF CARE: Patient and daughter agreeable to plans as above- patient pleased with her rehab thus far at SNF and is optimisitic and eager to continue this once stable for d/c.     Reece Levy, MSW, [facility] [phone]

## 2012-10-19 NOTE — ED Provider Notes (Signed)
Medical screening examination/treatment/procedure(s) were performed by non-physician practitioner and as supervising physician I was immediately available for consultation/collaboration.  Juliet Rude. Rubin Payor, MD 10/19/12 1436

## 2012-10-19 NOTE — Care Management Note (Signed)
    Page 1 of 1   10/19/2012     4:04:02 PM   CARE MANAGEMENT NOTE 10/19/2012  Patient:  Sara [name],Sara [name]   Account Number:  [id number]  Date Initiated:  10/19/2012  Documentation initiated by:  GRAVES-BIGELOW,Nick Armel  Subjective/Objective Assessment:   Pt admitted  with  Dyspnea  from [facility].     Action/Plan:   CSW assisting with disposition needs. CM will contine to monitor.   Anticipated DC Date:  10/23/2012   Anticipated DC Plan:  SKILLED NURSING FACILITY      DC Planning Services  CM consult      Choice offered to / List presented to:             Status of service:  Completed, signed off Medicare Important Message given?   (If response is "NO", the following Medicare IM given date fields will be blank) Date Medicare IM given:   Date Additional Medicare IM given:    Discharge Disposition:  SKILLED NURSING FACILITY  Per UR Regulation:  Reviewed for med. necessity/level of care/duration of stay  If discussed at Long Length of Stay Meetings, dates discussed:    Comments:

## 2012-10-19 NOTE — Progress Notes (Signed)
[facility] PROGRESS NOTE  Sara [name] ZOX:[id number] DOB: [birth year] DOA: 10/18/2012 PCP: Londell Moh, MD  Assessment/Plan: 1-Acute on chronic diastolic CHF - pleural effusions noted on CT scan, increase BNP at 4000. Patient relates improvement dyspnea after diuresis. Urine out put 1.6 L. Continue with lasix 40 mg BID. B-met pending for today.   2-Leukocytosischronic from her T cell lymphoma (weekly CBCs for at least a year). Probably higher secondary  to cellulitis. Follow trend.  3-DVT ppx : Heparin while in hospital. Anticoagulation for DVT prophylaxis after AKA defer to ortho.  4-Cellulitis thigh, perineal area, fungal infection: Will start fluconazole and doxycycline. Nystatin powder ordered.  5-Constipation: Docusate.  6-Right AKA: Dr Lajoyce Corners office inform of admission.  7-A fib: continue with Coreg and Cardizem.  8-CAD: Plavix and aspirin.     Code Status: DNR Family Communication: Care discussed with daughter who was at bedside.  Disposition Plan: remain in patient.    Consultants:  Dr Lajoyce Corners.   Procedures:  None.  Antibiotics:  Diflucan 2-06  Doxycycline: 2-06  HPI/Subjective: Feeling better. She is breathing better although no at baseline.   Objective: Filed Vitals:   10/18/12 2215 10/19/12 0018 10/19/12 0154 10/19/12 0630  BP: 138/75  145/42 157/56  Pulse: 64  64 63  Temp:   98 F (36.7 C) 98.4 F (36.9 C)  TempSrc:   Oral Axillary  Resp: 20     Height:   5\' 7"  (1.702 m)   Weight:   60.328 kg (133 lb)   SpO2: 99% 99% 97% 98%    Intake/Output Summary (Last 24 hours) at 10/19/12 1034 Last data filed at 10/19/12 1023  Gross per 24 hour  Intake    600 ml  Output   1600 ml  Net  -1000 ml   Filed Weights   10/19/12 0154  Weight: 60.328 kg (133 lb)    Exam:   General:  No distress  Cardiovascular: S 1, S 2 RRR  Respiratory: crackle, no wheezes  Abdomen: BS present, soft, NT  Extremities; Right ABA with staple, dark old  blood,  Skin: pressure ulcer buttock, erythema perineum, thigh.   Data Reviewed: Basic Metabolic Panel:  Lab 10/18/12 8119 10/18/12 1929  NA 134* 131*  K 4.7 4.7  CL 100 97  CO2 -- 24  GLUCOSE 120* 119*  BUN 28* 24*  CREATININE 0.80 0.62  CALCIUM -- 8.8  MG -- --  PHOS -- --   Liver Function Tests:  Lab 10/18/12 1929  AST 28  ALT 16  ALKPHOS 48  BILITOT 0.2*  PROT 5.9*  ALBUMIN 2.3*   No results found for this basename: LIPASE:5,AMYLASE:5 in the last 168 hours No results found for this basename: AMMONIA:5 in the last 168 hours CBC:  Lab 10/19/12 0621 10/18/12 2012 10/18/12 1929  WBC 24.4* -- 23.6*  NEUTROABS 9.0* -- 8.7*  HGB 10.7* 10.5* 9.9*  HCT 32.7* 31.0* 30.5*  MCV 87.0 -- 87.9  PLT 549* -- 542*   Cardiac Enzymes:  Lab 10/19/12 0621 10/19/12 0020 10/18/12 2010  CKTOTAL -- -- --  CKMB -- -- --  CKMBINDEX -- -- --  TROPONINI <0.30 <0.30 <0.30   BNP (last 3 results)  Basename 10/18/12 2010 10/05/12 1545 03/24/12 0400  PROBNP 4481.0* 3200.0* 2150.0*   CBG: No results found for this basename: GLUCAP:5 in the last 168 hours  Recent Results (from the past 240 hour(s))  SURGICAL PCR SCREEN     Status: Abnormal   Collection Time  10/10/12  6:02 AM      Component Value Range Status Comment   MRSA, PCR POSITIVE (*) NEGATIVE Final    Staphylococcus aureus POSITIVE (*) NEGATIVE Final   MRSA PCR SCREENING     Status: Abnormal   Collection Time   10/19/12  2:02 AM      Component Value Range Status Comment   MRSA by PCR POSITIVE (*) NEGATIVE Final      Studies: Ct Angio Chest Pe W/cm &/or Wo Cm  10/18/2012  *RADIOLOGY REPORT*  Clinical Data: Shortness of breath beginning after recent surgery.  CT ANGIOGRAPHY CHEST  Technique:  Multidetector CT imaging of the chest using the standard protocol during bolus administration of intravenous contrast. Multiplanar reconstructed images including MIPs were obtained and reviewed to evaluate the vascular anatomy.   Contrast: OMNIPAQUE IOHEXOL 350 MG/ML SOLN  Comparison: 06/02/2012  Findings: Technically adequate study with good opacification of the central and segmental pulmonary arteries.  No focal filling defects.  No evidence of significant pulmonary embolus.  Normal heart size.  Calcification in the mitral valve annulus and aortic valve.  Calcification in the coronary arteries.  Calcification in the thoracic aorta.  Normal caliber thoracic aorta.  Prominent but nonpathologically enlarged lymph nodes are demonstrated throughout the mediastinum and in both axilla.  This is stable since the previous study in these are likely reactive.  The esophagus is decompressed.  Visualized portions of the upper abdominal organs are unremarkable except for vascular calcifications which are extensive.  There are small bilateral pleural effusions with mild bilateral basilar atelectasis.  Visualization of the lung parenchyma is limited due to respiratory motion artifact but there is no specific consolidation or airspace disease noted.  Bronchial wall thickening suggesting chronic bronchitis.  Airways appear patent.  No pneumothorax.  Degenerative changes throughout the thoracic spine. Normal alignment.  Compression of T12 post kyphoplasty.  IMPRESSION: No evidence of significant pulmonary embolus.  Small bilateral pleural effusions with basilar atelectasis.  Extensive vascular calcifications.   Original Report Authenticated By: Burman Nieves, M.D.    Dg Chest Portable 1 View  10/18/2012  *RADIOLOGY REPORT*  Clinical Data: Shortness of breath.  PORTABLE CHEST - 1 VIEW  Comparison: 10/05/2012 chest x-ray.  06/02/2012 chest CT.  Findings: Hazy opacity right infrahilar region may represent overlapping structures.  Subtle right infrahilar infiltrate not entirely excluded.  Central pulmonary vascular prominence.  No pulmonary edema or gross pneumothorax.  Calcified aorta.  Heart size within normal limits.  CT detected tiny noncalcified  nodules not appreciated on this present plain film exam.  IMPRESSION: Hazy opacity right infrahilar region may represent overlapping structures.  Subtle right infrahilar infiltrate not entirely excluded.   Original Report Authenticated By: Lacy Duverney, M.D.     Scheduled Meds:   . antiseptic oral rinse  15 mL Mouth Rinse BID  . aspirin  81 mg Oral Daily  . bexarotene  75 mg Oral QAC supper  . carvedilol  12.5 mg Oral BID WC  . Chlorhexidine Gluconate Cloth  6 each Topical Q0600  . clopidogrel  75 mg Oral Q breakfast  . diltiazem  120 mg Oral Daily  . docusate sodium  100 mg Oral Daily  . doxycycline  100 mg Oral Q12H  . ferrous sulfate  325 mg Oral BID WC  . fluconazole  100 mg Oral Daily  . folic acid  1 mg Oral Daily  . furosemide  40 mg Intravenous BID  . heparin  5,000 Units Subcutaneous Q8H  .  hydrOXYzine  10 mg Oral QHS  . lisinopril  5 mg Oral Daily  . mupirocin ointment  1 application Nasal BID  . pantoprazole  40 mg Oral Daily  . polyethylene glycol  17 g Oral Daily  . protein supplement  2 scoop Oral BID  . sodium chloride  3 mL Intravenous Q12H  . 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 If 8PM-8AM, please contact night-coverage at [email or web address], password [facility] 10/19/2012, 10:34 AM  LOS: 1 day

## 2012-10-19 NOTE — Consult Note (Signed)
WOC consult Note Reason for Consult: eval pressure ulcer, however upon my assessment this presents with linear erthyema of the bilateral groins, labial area, perineum and buttocks consistent with moisture and candida. Noted as well at the time of my assessment, pt has multiple skin tears with senile purpura over the LLE. She has a tape related partial thickness skin injury of the right thigh s/p AKA.  The nursing staff have appropriately initiated silicone foam dressings for the upper extremities and the thigh wound, this will insulate and protect these areas and will be gentle to this pts frail skin.  Wound type: fungal infection-groin, perineum, labia, buttocks Pressure Ulcer POA: Yes Dressing procedure/placement/frequency: LALM for moisture control; Diamthazole barrier cream on the buttocks; Interdry Ag+ for the bilateral groin-this will provide antimicrobial effect and wick moisture away from the skin folds.  Requested PO antifungal per MD.  I also requested follow up while admitted on the right AKA stump wound, still has staples intact, appears to be healing well, requested MD to see if ortho can follow up with pt and daughter on the care of this wound.   Re consult if needed, will not follow at this time. Thanks  Jenina Moening [employer], CWOCN [phone])

## 2012-10-19 NOTE — Consult Note (Signed)
Reason for Consult: right AKA Referring Physician: Dr. Russ Halo is an 79 y.o. female.  HPI: previous AKA by Dr. Lajoyce Corners , readmitted with increased WBC and dyspnea , pleural effusion,  Hx CHF  Past Medical History  Diagnosis Date  . Hypertension   . Atrial fibrillation   . Psoriasis   . Congestive heart failure (CHF)   . History of CVA (cerebrovascular accident)   . Coronary artery disease   . Myocardial infarction   . CHF (congestive heart failure)   . Shortness of breath   . H/O hiatal hernia   . Closed fracture of head of left humerus 5/12  . Basal cell carcinoma   . 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  Flow cytometry 04/04/12: 91% cells CD4 positive CD 26 negative  . Hyperlipemia   . Pneumonia   . Wound of right leg 09/25/2012    Necrotic, open wound right lower leg; non healing  . Stroke     Past Surgical History  Procedure Date  . Tonsillectomy   . Dilation and curettage of uterus   . Eye surgery   . Cataracts   . Vertebroplasty   . Amputation 10/10/2012    Procedure: AMPUTATION ABOVE KNEE;  Surgeon: Nadara Mustard, MD;  Location: [facility] OR;  Service: Orthopedics;  Laterality: Right;    Family History  Problem Relation Age of Onset  . Stomach cancer Mother   . Pneumonia Father     Social History:  reports that she has never smoked. She has never used smokeless tobacco. She reports that she does not drink alcohol or use illicit drugs.  Allergies:  Allergies  Allergen Reactions  . Penicillins Other (See Comments)    unknown  . Triamterene Other (See Comments)    unknown  . Alendronate Sodium Rash    Pt stated oh k to use biotene    Medications: I have reviewed the patient's current medications.  Results for orders placed during the hospital encounter of 10/18/12 (from the past 48 hour(s))  CBC WITH  DIFFERENTIAL     Status: Abnormal   Collection Time   10/18/12  7:29 PM      Component Value Range Comment   WBC 23.6 (*) 4.0 - 10.5 K/uL    RBC 3.47 (*) 3.87 - 5.11 MIL/uL    Hemoglobin 9.9 (*) 12.0 - 15.0 g/dL    HCT 16.1 (*) 09.6 - 46.0 %    MCV 87.9  78.0 - 100.0 fL    MCH 28.5  26.0 - 34.0 pg    MCHC 32.5  30.0 - 36.0 g/dL    RDW 04.5  40.9 - 81.1 %    Platelets 542 (*) 150 - 400 K/uL    Neutrophils Relative 37 (*) 43 - 77 %    Lymphocytes Relative 57 (*) 12 - 46 %    Monocytes Relative 3  3 - 12 %    Eosinophils Relative 2  0 - 5 %    Basophils Relative 1  0 - 1 %    Neutro Abs 8.7 (*) 1.7 - 7.7 K/uL    Lymphs Abs 13.5 (*) 0.7 - 4.0 K/uL    Monocytes Absolute 0.7  0.1 - 1.0 K/uL    Eosinophils Absolute 0.5  0.0 - 0.7 K/uL    Basophils Absolute 0.2 (*) 0.0 -  0.1 K/uL    WBC Morphology ATYPICAL MONONUCLEAR CELLS     COMPREHENSIVE METABOLIC PANEL     Status: Abnormal   Collection Time   10/18/12  7:29 PM      Component Value Range Comment   Sodium 131 (*) 135 - 145 mEq/L    Potassium 4.7  3.5 - 5.1 mEq/L SLIGHT HEMOLYSIS   Chloride 97  96 - 112 mEq/L    CO2 24  19 - 32 mEq/L    Glucose, Bld 119 (*) 70 - 99 mg/dL    BUN 24 (*) 6 - 23 mg/dL    Creatinine, Ser 9.60  0.50 - 1.10 mg/dL    Calcium 8.8  8.4 - 45.4 mg/dL    Total Protein 5.9 (*) 6.0 - 8.3 g/dL    Albumin 2.3 (*) 3.5 - 5.2 g/dL    AST 28  0 - 37 U/L    ALT 16  0 - 35 U/L    Alkaline Phosphatase 48  39 - 117 U/L    Total Bilirubin 0.2 (*) 0.3 - 1.2 mg/dL    GFR calc non Af Amer 76 (*) >90 mL/min    GFR calc Af Amer 89 (*) >90 mL/min   PROTIME-INR     Status: Normal   Collection Time   10/18/12  7:29 PM      Component Value Range Comment   Prothrombin Time 13.5  11.6 - 15.2 seconds    INR 1.04  0.00 - 1.49   PATHOLOGIST SMEAR REVIEW     Status: Normal   Collection Time   10/18/12  7:29 PM      Component Value Range Comment   Path Review ABSOLUTE LYMPHOCYTOSIS WITH     TROPONIN I     Status: Normal   Collection  Time   10/18/12  8:10 PM      Component Value Range Comment   Troponin I <0.30  <0.30 ng/mL   PRO B NATRIURETIC PEPTIDE     Status: Abnormal   Collection Time   10/18/12  8:10 PM      Component Value Range Comment   Pro B Natriuretic peptide (BNP) 4481.0 (*) 0 - 450 pg/mL   POCT I-STAT, CHEM 8     Status: Abnormal   Collection Time   10/18/12  8:12 PM      Component Value Range Comment   Sodium 134 (*) 135 - 145 mEq/L    Potassium 4.7  3.5 - 5.1 mEq/L    Chloride 100  96 - 112 mEq/L    BUN 28 (*) 6 - 23 mg/dL    Creatinine, Ser 0.98  0.50 - 1.10 mg/dL    Glucose, Bld 119 (*) 70 - 99 mg/dL    Calcium, Ion 1.47 (*) 1.13 - 1.30 mmol/L    TCO2 28  0 - 100 mmol/L    Hemoglobin 10.5 (*) 12.0 - 15.0 g/dL    HCT 82.9 (*) 56.2 - 46.0 %   URINALYSIS, ROUTINE W REFLEX MICROSCOPIC     Status: Normal   Collection Time   10/18/12  9:26 PM      Component Value Range Comment   Color, Urine YELLOW  YELLOW    APPearance CLEAR  CLEAR    Specific Gravity, Urine 1.011  1.005 - 1.030    pH 5.5  5.0 - 8.0    Glucose, UA NEGATIVE  NEGATIVE mg/dL    Hgb urine dipstick NEGATIVE  NEGATIVE    Bilirubin Urine  NEGATIVE  NEGATIVE    Ketones, ur NEGATIVE  NEGATIVE mg/dL    Protein, ur NEGATIVE  NEGATIVE mg/dL    Urobilinogen, UA 0.2  0.0 - 1.0 mg/dL    Nitrite NEGATIVE  NEGATIVE    Leukocytes, UA NEGATIVE  NEGATIVE MICROSCOPIC NOT DONE ON URINES WITH NEGATIVE PROTEIN, BLOOD, LEUKOCYTES, NITRITE, OR GLUCOSE <1000 mg/dL.  TROPONIN I     Status: Normal   Collection Time   10/19/12 12:20 AM      Component Value Range Comment   Troponin I <0.30  <0.30 ng/mL   MRSA PCR SCREENING     Status: Abnormal   Collection Time   10/19/12  2:02 AM      Component Value Range Comment   MRSA by PCR POSITIVE (*) NEGATIVE   TROPONIN I     Status: Normal   Collection Time   10/19/12  6:21 AM      Component Value Range Comment   Troponin I <0.30  <0.30 ng/mL   CBC WITH DIFFERENTIAL     Status: Abnormal   Collection Time   10/19/12   6:21 AM      Component Value Range Comment   WBC 24.4 (*) 4.0 - 10.5 K/uL    RBC 3.76 (*) 3.87 - 5.11 MIL/uL    Hemoglobin 10.7 (*) 12.0 - 15.0 g/dL    HCT 96.0 (*) 45.4 - 46.0 %    MCV 87.0  78.0 - 100.0 fL    MCH 28.5  26.0 - 34.0 pg    MCHC 32.7  30.0 - 36.0 g/dL    RDW 09.8  11.9 - 14.7 %    Platelets 549 (*) 150 - 400 K/uL    Neutrophils Relative 37 (*) 43 - 77 %    Lymphocytes Relative 57 (*) 12 - 46 %    Monocytes Relative 4  3 - 12 %    Eosinophils Relative 2  0 - 5 %    Basophils Relative 0  0 - 1 %    Neutro Abs 9.0 (*) 1.7 - 7.7 K/uL    Lymphs Abs 13.9 (*) 0.7 - 4.0 K/uL    Monocytes Absolute 1.0  0.1 - 1.0 K/uL    Eosinophils Absolute 0.5  0.0 - 0.7 K/uL    Basophils Absolute 0.0  0.0 - 0.1 K/uL    RBC Morphology POLYCHROMASIA PRESENT      WBC Morphology ABSOLUTE LYMPHOCYTOSIS   ATYPICAL LYMPHOCYTES  TROPONIN I     Status: Normal   Collection Time   10/19/12 10:58 AM      Component Value Range Comment   Troponin I <0.30  <0.30 ng/mL   BASIC METABOLIC PANEL     Status: Abnormal   Collection Time   10/19/12 10:58 AM      Component Value Range Comment   Sodium 132 (*) 135 - 145 mEq/L    Potassium 3.7  3.5 - 5.1 mEq/L DELTA CHECK NOTED   Chloride 94 (*) 96 - 112 mEq/L    CO2 28  19 - 32 mEq/L    Glucose, Bld 148 (*) 70 - 99 mg/dL    BUN 20  6 - 23 mg/dL    Creatinine, Ser 8.29  0.50 - 1.10 mg/dL    Calcium 8.8  8.4 - 56.2 mg/dL    GFR calc non Af Amer 80 (*) >90 mL/min    GFR calc Af Amer >90  >90 mL/min     Ct Angio Chest Pe  W/cm &/or Wo Cm  10/18/2012  *RADIOLOGY REPORT*  Clinical Data: Shortness of breath beginning after recent surgery.  CT ANGIOGRAPHY CHEST  Technique:  Multidetector CT imaging of the chest using the standard protocol during bolus administration of intravenous contrast. Multiplanar reconstructed images including MIPs were obtained and reviewed to evaluate the vascular anatomy.  Contrast: OMNIPAQUE IOHEXOL 350 MG/ML SOLN  Comparison:  06/02/2012  Findings: Technically adequate study with good opacification of the central and segmental pulmonary arteries.  No focal filling defects.  No evidence of significant pulmonary embolus.  Normal heart size.  Calcification in the mitral valve annulus and aortic valve.  Calcification in the coronary arteries.  Calcification in the thoracic aorta.  Normal caliber thoracic aorta.  Prominent but nonpathologically enlarged lymph nodes are demonstrated throughout the mediastinum and in both axilla.  This is stable since the previous study in these are likely reactive.  The esophagus is decompressed.  Visualized portions of the upper abdominal organs are unremarkable except for vascular calcifications which are extensive.  There are small bilateral pleural effusions with mild bilateral basilar atelectasis.  Visualization of the lung parenchyma is limited due to respiratory motion artifact but there is no specific consolidation or airspace disease noted.  Bronchial wall thickening suggesting chronic bronchitis.  Airways appear patent.  No pneumothorax.  Degenerative changes throughout the thoracic spine. Normal alignment.  Compression of T12 post kyphoplasty.  IMPRESSION: No evidence of significant pulmonary embolus.  Small bilateral pleural effusions with basilar atelectasis.  Extensive vascular calcifications.   Original Report Authenticated By: Burman Nieves, M.D.    Dg Chest Portable 1 View  10/18/2012  *RADIOLOGY REPORT*  Clinical Data: Shortness of breath.  PORTABLE CHEST - 1 VIEW  Comparison: 10/05/2012 chest x-ray.  06/02/2012 chest CT.  Findings: Hazy opacity right infrahilar region may represent overlapping structures.  Subtle right infrahilar infiltrate not entirely excluded.  Central pulmonary vascular prominence.  No pulmonary edema or gross pneumothorax.  Calcified aorta.  Heart size within normal limits.  CT detected tiny noncalcified nodules not appreciated on this present plain film exam.   IMPRESSION: Hazy opacity right infrahilar region may represent overlapping structures.  Subtle right infrahilar infiltrate not entirely excluded.   Original Report Authenticated By: Lacy Duverney, M.D.     Review of Systems  Respiratory: Positive for shortness of breath.   Cardiovascular:       Dyspnea better after diuresis  Musculoskeletal:       Right AKA  Psychiatric/Behavioral: Negative for depression and suicidal ideas.   Blood pressure 122/63, pulse 68, temperature 98.2 F (36.8 C), temperature source Axillary, resp. rate 20, height 5\' 7"  (1.702 m), weight 60.328 kg (133 lb), SpO2 98.00%. Physical Exam  Constitutional: She appears well-nourished.  HENT:  Head: Normocephalic and atraumatic.  Neck: No JVD present.  Cardiovascular: Normal rate.   Respiratory: She exhibits no tenderness.  GI: Soft.  Musculoskeletal:       Right AKA stump healing well. No open areas.  Anterior groin with skin dermal excoriation not full thickness. n ostump cellulitis  no stump incision drainage. Soft, still tender as expected.   Assessment/Plan:  previous AKA ,   Stump healing. Leave staples in.   Follow up with dr. Lajoyce Corners as planned.   Cloa Bushong C 10/19/2012, 6:24 PM

## 2012-10-20 ENCOUNTER — Other Ambulatory Visit: Payer: Self-pay | Admitting: *Deleted

## 2012-10-20 DIAGNOSIS — I4891 Unspecified atrial fibrillation: Secondary | ICD-10-CM

## 2012-10-20 DIAGNOSIS — L0291 Cutaneous abscess, unspecified: Secondary | ICD-10-CM

## 2012-10-20 LAB — BASIC METABOLIC PANEL
Calcium: 9 mg/dL (ref 8.4–10.5)
Creatinine, Ser: 0.59 mg/dL (ref 0.50–1.10)
GFR calc non Af Amer: 78 mL/min — ABNORMAL LOW (ref >90)
Sodium: 132 mEq/L — ABNORMAL LOW (ref 135–145)

## 2012-10-20 LAB — CBC
MCH: 28.6 pg (ref 26.0–34.0)
Platelets: 554 10*3/uL — ABNORMAL HIGH (ref 150–400)
RBC: 3.88 MIL/uL (ref 3.87–5.11)
RDW: 15.2 % (ref 11.5–15.5)
WBC: 23.9 10*3/uL — ABNORMAL HIGH (ref 4.0–10.5)

## 2012-10-20 MED ORDER — DOXYCYCLINE HYCLATE 100 MG PO TABS: 100.0000 mg | ORAL_TABLET | Freq: Two times a day (BID) | ORAL | Status: DC

## 2012-10-20 MED ORDER — NYSTATIN 100000 UNIT/GM EX POWD: Freq: Four times a day (QID) | CUTANEOUS | Status: DC

## 2012-10-20 MED ORDER — FUROSEMIDE 40 MG PO TABS: 40.0000 mg | ORAL_TABLET | Freq: Two times a day (BID) | ORAL | Status: DC

## 2012-10-20 MED ORDER — CHLORHEXIDINE GLUCONATE CLOTH 2 % EX PADS: 6.0000 | MEDICATED_PAD | Freq: Every day | CUTANEOUS | Status: DC

## 2012-10-20 MED ORDER — FLUCONAZOLE 100 MG PO TABS: 100.0000 mg | ORAL_TABLET | Freq: Every day | ORAL | Status: DC

## 2012-10-20 NOTE — Clinical Documentation Improvement (Signed)
GENERIC DOCUMENTATION CLARIFICATION QUERY  THIS DOCUMENT IS NOT A PERMANENT PART OF THE MEDICAL RECORD  TO RESPOND TO THE THIS QUERY, FOLLOW THE INSTRUCTIONS BELOW:  1. If needed, update documentation for the patient's encounter via the notes activity.  2. Access this query again and click edit on the In [employer].  3. After updating, or not, click F2 to complete all highlighted (required) fields concerning your review. Select "additional documentation in the medical record" OR "no additional documentation provided".  4. Click Sign note button.  5. The deficiency will fall out of your In Basket *Please let us know if you are not able to complete this workflow by phone or e-mail (listed below).  Please update your documentation within the medical record to reflect your response to this query.                                                                                        10/20/12   Dear Dr. Sunnie Nielsen  / Associates,  In a better effort to capture your patient's severity of illness/SOI, risk of mortality/ROM, reflect appropriate length of stay and utilization of resources, a review of the patient medical record has revealed the following indicators.   PLEASE CLARIFY TERM 'BED BOUND' IN NOTES AND DC SUMMARY TO MORE CLEARLY REFLECT PATIENT'S SEVERITY OF ILLNESS AND RISK OF MORTALITY.  THANK YOU.  Possible Clinical Conditions? - Functional quadraplegia - Other Condition (please specify)   Supporting Information: - Risk Factors: Per H&P SNF patient  with "bed bound" status, recent Rt AKA, skin breakdown in pelvic area  You may use possible, probable, or suspect with inpatient documentation. possible, probable, suspected diagnoses MUST be documented at the time of discharge  Reviewed:  no additional documentation provided  Thank You,  Beverley Fiedler RN BSN Clinical Documentation Specialist: Tele:  [phone]  Health Information Management [place]

## 2012-10-20 NOTE — Consult Note (Signed)
Wound care follow-up:  Wound consult requested; WOC team performed consult on 2/6.  Please refer to consult note for assessment and plan of care. Will not plan to follow further unless re-consulted.  [address] RN, MSN, [employer]  [phone]

## 2012-10-20 NOTE — Telephone Encounter (Signed)
Received calll from Trudie Reed DOA/[facility] stating pt to be d/c from hosp today & was on targretin & has # 1 left & needs some direction from the MD.  Message left on Marilyn's vm that she should have some refills on this drug & Dr Cyndie Chime said OK to continue.

## 2012-10-20 NOTE — Progress Notes (Signed)
[facility] PROGRESS NOTE  Sara [name] BJY:[id number] DOB: 1918/10/23 DOA: 10/18/2012 PCP: Londell Moh, MD  Assessment/Plan: 1-Acute on chronic diastolic CHF - pleural effusions noted on CT scan, increase BNP at 4000. Patient relates improvement dyspnea after diuresis. Urine out put 2.4 L. Continue with lasix 40 mg BID. B-met normal potassium.  2-Leukocytosischronic from her T cell lymphoma (weekly CBCs for at least a year). Probably higher secondary  to cellulitis. Follow trend. WBC stable. 3-DVT ppx : Heparin while in hospital. Anticoagulation for DVT prophylaxis after AKA defer to ortho.  4-Cellulitis thigh, perineal area, fungal infection: Continue with fluconazole and doxycycline. Nystatin powder ordered. Redness decreasing. Changing frequent position.  5-Constipation: Docusate.  6-Right AKA: Dr Lajoyce Corners office inform of admission. Healing well. Appreciate Dr Ophelia Charter visit.  7-A fib: continue with Coreg and Cardizem.  8-CAD: Plavix and aspirin.     Code Status: DNR Family Communication: Care discussed with patient. Disposition Plan: If stable will plan for discharge 2-8/   Consultants:  Dr Lajoyce Corners.   Procedures:  None.  Antibiotics:  Diflucan 2-06  Doxycycline: 2-06  HPI/Subjective: Feeling better. She is breathing better almost  at baseline.  Still with pain perineal area, buttock.   Objective: Filed Vitals:   10/19/12 1432 10/19/12 2142 10/20/12 0607 10/20/12 0900  BP: 122/63 126/51 117/55 128/68  Pulse:  67 77 67  Temp:  98.6 F (37 C) 98.3 F (36.8 C)   TempSrc:  Oral Oral   Resp:      Height:      Weight:   60.328 kg (133 lb)   SpO2:  98% 97%     Intake/Output Summary (Last 24 hours) at 10/20/12 0944 Last data filed at 10/19/12 1536  Gross per 24 hour  Intake    720 ml  Output   1600 ml  Net   -880 ml   Filed Weights   10/19/12 0154 10/20/12 0607  Weight: 60.328 kg (133 lb) 60.328 kg (133 lb)    Exam:   General:  No  distress  Cardiovascular: S 1, S 2 RRR  Respiratory: crackle, no wheezes  Abdomen: BS present, soft, NT  Extremities; Right ABA with dressing.  Skin: pressure ulcer buttock, erythema perineum, thigh with decrease redness.   Data Reviewed: Basic Metabolic Panel:  Lab 10/20/12 0865 10/19/12 1058 10/18/12 2012 10/18/12 1929  NA 132* 132* 134* 131*  K 4.2 3.7 4.7 4.7  CL 94* 94* 100 97  CO2 30 28 -- 24  GLUCOSE 90 148* 120* 119*  BUN 25* 20 28* 24*  CREATININE 0.59 0.54 0.80 0.62  CALCIUM 9.0 8.8 -- 8.8  MG -- -- -- --  PHOS -- -- -- --   Liver Function Tests:  Lab 10/18/12 1929  AST 28  ALT 16  ALKPHOS 48  BILITOT 0.2*  PROT 5.9*  ALBUMIN 2.3*   CBC:  Lab 10/20/12 0530 10/19/12 0621 10/18/12 2012 10/18/12 1929  WBC 23.9* 24.4* -- 23.6*  NEUTROABS -- 9.0* -- 8.7*  HGB 11.1* 10.7* 10.5* 9.9*  HCT 34.0* 32.7* 31.0* 30.5*  MCV 87.6 87.0 -- 87.9  PLT 554* 549* -- 542*   Cardiac Enzymes:  Lab 10/19/12 1058 10/19/12 0621 10/19/12 0020 10/18/12 2010  CKTOTAL -- -- -- --  CKMB -- -- -- --  CKMBINDEX -- -- -- --  TROPONINI <0.30 <0.30 <0.30 <0.30   BNP (last 3 results)  Basename 10/18/12 2010 10/05/12 1545 03/24/12 0400  PROBNP 4481.0* 3200.0* 2150.0*   CBG: No results  found for this basename: GLUCAP:5 in the last 168 hours  Recent Results (from the past 240 hour(s))  MRSA PCR SCREENING     Status: Abnormal   Collection Time   10/19/12  2:02 AM      Component Value Range Status Comment   MRSA by PCR POSITIVE (*) NEGATIVE Final      Studies: Ct Angio Chest Pe W/cm &/or Wo Cm  10/18/2012  *RADIOLOGY REPORT*  Clinical Data: Shortness of breath beginning after recent surgery.  CT ANGIOGRAPHY CHEST  Technique:  Multidetector CT imaging of the chest using the standard protocol during bolus administration of intravenous contrast. Multiplanar reconstructed images including MIPs were obtained and reviewed to evaluate the vascular anatomy.  Contrast: OMNIPAQUE  IOHEXOL 350 MG/ML SOLN  Comparison: 06/02/2012  Findings: Technically adequate study with good opacification of the central and segmental pulmonary arteries.  No focal filling defects.  No evidence of significant pulmonary embolus.  Normal heart size.  Calcification in the mitral valve annulus and aortic valve.  Calcification in the coronary arteries.  Calcification in the thoracic aorta.  Normal caliber thoracic aorta.  Prominent but nonpathologically enlarged lymph nodes are demonstrated throughout the mediastinum and in both axilla.  This is stable since the previous study in these are likely reactive.  The esophagus is decompressed.  Visualized portions of the upper abdominal organs are unremarkable except for vascular calcifications which are extensive.  There are small bilateral pleural effusions with mild bilateral basilar atelectasis.  Visualization of the lung parenchyma is limited due to respiratory motion artifact but there is no specific consolidation or airspace disease noted.  Bronchial wall thickening suggesting chronic bronchitis.  Airways appear patent.  No pneumothorax.  Degenerative changes throughout the thoracic spine. Normal alignment.  Compression of T12 post kyphoplasty.  IMPRESSION: No evidence of significant pulmonary embolus.  Small bilateral pleural effusions with basilar atelectasis.  Extensive vascular calcifications.   Original Report Authenticated By: Burman Nieves, M.D.    Dg Chest Portable 1 View  10/18/2012  *RADIOLOGY REPORT*  Clinical Data: Shortness of breath.  PORTABLE CHEST - 1 VIEW  Comparison: 10/05/2012 chest x-ray.  06/02/2012 chest CT.  Findings: Hazy opacity right infrahilar region may represent overlapping structures.  Subtle right infrahilar infiltrate not entirely excluded.  Central pulmonary vascular prominence.  No pulmonary edema or gross pneumothorax.  Calcified aorta.  Heart size within normal limits.  CT detected tiny noncalcified nodules not appreciated on  this present plain film exam.  IMPRESSION: Hazy opacity right infrahilar region may represent overlapping structures.  Subtle right infrahilar infiltrate not entirely excluded.   Original Report Authenticated By: Lacy Duverney, M.D.     Scheduled Meds:    . antiseptic oral rinse  15 mL Mouth Rinse BID  . aspirin  81 mg Oral Daily  . bexarotene  75 mg Oral QAC supper  . carvedilol  12.5 mg Oral BID WC  . Chlorhexidine Gluconate Cloth  6 each Topical Q0600  . clopidogrel  75 mg Oral Q breakfast  . diltiazem  120 mg Oral Daily  . docusate sodium  100 mg Oral Daily  . doxycycline  100 mg Oral Q12H  . ferrous sulfate  325 mg Oral BID WC  . fluconazole  100 mg Oral Daily  . folic acid  1 mg Oral Daily  . furosemide  40 mg Intravenous BID  . heparin  5,000 Units Subcutaneous Q8H  . hydrOXYzine  10 mg Oral QHS  . lisinopril  5 mg  Oral Daily  . mupirocin ointment  1 application Nasal BID  . nystatin   Topical BID  . pantoprazole  40 mg Oral Daily  . polyethylene glycol  17 g Oral Daily  . protein supplement  2 scoop Oral BID  . sodium chloride  3 mL Intravenous Q12H  . 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 If 8PM-8AM, please contact night-coverage at [email or web address], password [facility] 10/20/2012, 9:44 AM  LOS: 2 days

## 2012-10-20 NOTE — Progress Notes (Signed)
Physician Discharge Summary  Sara [name]:[id number] DOB: 12/20/1918 DOA: 10/18/2012  PCP: Londell Moh, MD  Admit date: 10/18/2012   Discharge Instructions     Medication List     As of 10/20/2012  3:51 PM    TAKE these medications         acetaminophen 325 MG tablet   Commonly known as: TYLENOL   Take 2 tablets (650 mg total) by mouth every 4 (four) hours as needed (temperature >/= 99.5 F).      aspirin 81 MG chewable tablet   Chew 81 mg by mouth daily.      bexarotene 75 MG Caps capsule   Commonly known as: TARGRETIN   Take 75 mg by mouth daily before supper. Give with food. Protect from light. CAUTION: Chemotherapy/Biotherapy      carvedilol 12.5 MG tablet   Commonly known as: COREG   Take 12.5 mg by mouth 2 (two) times daily with a meal.      Chlorhexidine Gluconate Cloth 2 % Pads   Apply 6 each topically daily at 6 (six) AM.      clopidogrel 75 MG tablet   Commonly known as: PLAVIX   Take 75 mg by mouth daily.      Co Q 10 100 MG Caps   Take 1 capsule by mouth daily.      diltiazem 120 MG 24 hr capsule   Commonly known as: CARDIZEM CD   Take 120 mg by mouth daily.      docusate sodium 100 MG capsule   Commonly known as: COLACE   Take 100 mg by mouth daily. For constipation      doxycycline 100 MG tablet   Commonly known as: VIBRA-TABS   Take 1 tablet (100 mg total) by mouth every 12 (twelve) hours.      ferrous sulfate 325 (65 FE) MG tablet   Take 325 mg by mouth 2 (two) times daily.      Fish Oil 1000 MG Caps   Take 1,000 mg by mouth at bedtime.      fluconazole 100 MG tablet   Commonly known as: DIFLUCAN   Take 1 tablet (100 mg total) by mouth daily.      folic acid 1 MG tablet   Commonly known as: FOLVITE   Take 1 mg by mouth daily.      furosemide 40 MG tablet   Commonly known as: LASIX   Take 1 tablet (40 mg total) by mouth 2 (two) times daily.      hydrocortisone cream 1 %   Apply 1 application topically 2 (two) times daily.  To flaking skin and rash      hydrOXYzine 10 MG tablet   Commonly known as: ATARAX/VISTARIL   Take 10 mg by mouth at bedtime.      lisinopril 5 MG tablet   Commonly known as: PRINIVIL,ZESTRIL   Take 1 tablet (5 mg total) by mouth daily.      nitroGLYCERIN 0.4 MG SL tablet   Commonly known as: NITROSTAT   Place 0.4 mg under the tongue every 5 (five) minutes as needed. For chest pain      nystatin powder   Commonly known as: MYCOSTATIN   Apply topically 4 (four) times daily.      oxyCODONE 5 MG immediate release tablet   Commonly known as: Oxy IR/ROXICODONE   Take 1-2 tablets (5-10 mg total) by mouth every 4 (four) hours as needed. Take 1 tablet for moderate  pain and 2 tablets for severe pain      pantoprazole 40 MG tablet   Commonly known as: PROTONIX   Take 40 mg by mouth daily.      polyethylene glycol packet   Commonly known as: MIRALAX / GLYCOLAX   Take 17 g by mouth daily.      protein supplement Powd   Take 2 scoop by mouth 2 (two) times daily. In beverage      vitamin C 500 MG tablet   Commonly known as: ASCORBIC ACID   Take 500 mg by mouth at bedtime.          The results of significant diagnostics from this hospitalization (including imaging, microbiology, ancillary and laboratory) are listed below for reference.    Significant Diagnostic Studies: Dg Chest 1 View   Franklin Baumbach  [facility] 10/20/2012, 3:51 PM

## 2012-10-21 DIAGNOSIS — D649 Anemia, unspecified: Secondary | ICD-10-CM

## 2012-10-21 LAB — CBC
MCH: 28.7 pg (ref 26.0–34.0)
MCHC: 32.8 g/dL (ref 30.0–36.0)
Platelets: 547 10*3/uL — ABNORMAL HIGH (ref 150–400)

## 2012-10-21 LAB — BASIC METABOLIC PANEL
BUN: 31 mg/dL — ABNORMAL HIGH (ref 6–23)
Calcium: 9.2 mg/dL (ref 8.4–10.5)
Creatinine, Ser: 0.58 mg/dL (ref 0.50–1.10)
GFR calc non Af Amer: 78 mL/min — ABNORMAL LOW (ref >90)
Glucose, Bld: 99 mg/dL (ref 70–99)

## 2012-10-21 MED ORDER — FUROSEMIDE 40 MG PO TABS
40.0000 mg | ORAL_TABLET | Freq: Two times a day (BID) | ORAL | Status: DC
  Administered 2012-10-21: 40 mg via ORAL
  Filled 2012-10-21 (×3): qty 1

## 2012-10-21 NOTE — Clinical Social Work Note (Signed)
Patient to return to [facility] today via [facility]. Patient, daughter and facility aware of transfer. Discharge packet complete. CSW signing off.  Ricke Hey, [place] [phone] (weekend)

## 2012-10-21 NOTE — Discharge Summary (Addendum)
Physician Discharge Summary  ASSATA JUNCAJ ZOX:[id number] DOB: Jan 21, 1919 DOA: 10/18/2012  PCP: Londell Moh, MD  Admit date: 10/18/2012 Discharge date: 10/21/2012  Time spent: 30  minutes  Recommendations for Outpatient Follow-up:  1. Please follow up Bmet to follow sodium , potassium level.  2. Please follow CBC, for leukocytosis.  3. Needs to follow with Dr Lajoyce Corners. 4. Needs to follow up with Dr Cyndie Chime in 1 to 2 weeks.  5. Follow improvement Cellulitis. 6. Need skin care.   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   Diet recommendation: [employer] Healthy.  Filed Weights   10/19/12 0154 10/20/12 0607 10/21/12 0500  Weight: 60.328 kg (133 lb) 60.328 kg (133 lb) 60.646 kg (133 lb 11.2 oz)    History of present illness:  Sara [name] is a 79 y.o. female who presents with dyspnea, onset 2 days ago at her nursing home. She also reportedly had an elevated D-Dimer they checked at the NH, her history is remarkable for a recent AKA on Jan 27 by Dr. Lajoyce Corners for a nonhealing wound on her R lateral malleolus. She also has a prior history of diastolic CHF with preserved EF, most recent echo on 10/06/12.  In the ED, CTA chest was negative for PE, did not demonstrate PNA, but did demonstrate mild B pleural effusions suggestive of CHF exacerbation. Her BNP was also noted to be elevated. She does have a leukocytosis of 23k, but this is consistent with all her recent CBCs and she has had some degree of leukocytosis for at least the past year. 40 Lasix given in ED and Hospitalist has been asked to admit.   Hospital Course:  Patient admitted with dyspnea, acute on chronic hearth failure exacerbation. After diuresis patient  respiratory status is back to baseline. She was diagnose with cellulitis and perineal candidiasis which was treated with diflucan and doxycycline. This has improved.   1-Acute on chronic diastolic CHF - Pleural effusions noted on CT scan, increase BNP at 4000. Patient relates improvement dyspnea after diuresis. Patient 5 L negative. She was treated initially with 40 Mg IV lasix BID. She will be discharge on oral lasix  40 mg BID. Please follow potasium level and sodium level.   2-Leukocytosischronic from her T cell lymphoma (weekly CBCs for at least a year). History of chronic leukocytosis secondary to lymphoma. Cellulitis improving, afebrile. Pl;ease repeat CBC. Monitor for fever.   3-DVT ppx : Heparin while in hospital. Anticoagulation for DVT prophylaxis after AKA defer to ortho.   4-Cellulitis thigh, perineal area, fungal infection: Continue with fluconazole and doxycycline. Nystatin powder ordered. Redness decreasing. Changing frequent position. Improving.   5-Constipation: Docusate.   6-Right AKA: Dr Lajoyce Corners office inform of admission. Healing well. Appreciate Dr Ophelia Charter visit.   7-A fib: continue with Coreg and Cardizem.   8-CAD: Plavix and aspirin.  9-Hyponatremia: In setting lasix. Hold ACE. Please repeat B-met in 24 -48 hours. Adjust medications as needed.    Procedures:  None.   Consultations:  Wound care.  Dr. Lajoyce Corners.   Discharge Exam: Filed Vitals:   10/20/12 1727 10/20/12 1739 10/20/12 2100 10/21/12 0500  BP: 121/55  94/44 168/58  Pulse: 59 60 75 69  Temp:   97.7 F (36.5  C) 97.1 F (36.2 C)  TempSrc:      Resp:   16 18  Height:      Weight:    60.646 kg (133 lb 11.2 oz)  SpO2: 100%  98% 100%    General: No distress. Cardiovascular: S 1, S 2 RRR Respiratory: few crackles bases.  Skin: multiple skin tear. Perinea area less redness.   Discharge Instructions  Discharge Orders   Future Orders Complete By Expires     Diet - low sodium heart healthy  As directed      Increase activity slowly  As directed         Medication List    STOP taking these medications       lisinopril 5 MG tablet  Commonly known as:  PRINIVIL,ZESTRIL      TAKE these medications       acetaminophen 325 MG tablet  Commonly known as:  TYLENOL  Take 2 tablets (650 mg total) by mouth every 4 (four) hours as needed (temperature >/= 99.5 F).     aspirin 81 MG chewable tablet  Chew 81 mg by mouth daily.     bexarotene 75 MG Caps capsule  Commonly known as:  TARGRETIN  Take 75 mg by mouth daily before supper. Give with food. Protect from light. CAUTION: Chemotherapy/Biotherapy     carvedilol 12.5 MG tablet  Commonly known as:  COREG  Take 12.5 mg by mouth 2 (two) times daily with a meal.     Chlorhexidine Gluconate Cloth 2 % Pads  Apply 6 each topically daily at 6 (six) AM.     clopidogrel 75 MG tablet  Commonly known as:  PLAVIX  Take 75 mg by mouth daily.     Co Q 10 100 MG Caps  Take 1 capsule by mouth daily.     diltiazem 120 MG 24 hr capsule  Commonly known as:  CARDIZEM CD  Take 120 mg by mouth daily.     docusate sodium 100 MG capsule  Commonly known as:  COLACE  Take 100 mg by mouth daily. For constipation     doxycycline 100 MG tablet  Commonly known as:  VIBRA-TABS  Take 1 tablet (100 mg total) by mouth every 12 (twelve) hours.     ferrous sulfate 325 (65 FE) MG tablet  Take 325 mg by mouth 2 (two) times daily.     Fish Oil 1000 MG Caps  Take 1,000 mg by mouth at bedtime.     fluconazole 100 MG tablet  Commonly known as:  DIFLUCAN  Take 1 tablet (100 mg total) by mouth daily.     folic acid 1 MG tablet  Commonly known as:  FOLVITE  Take 1 mg by mouth daily.     furosemide 40 MG tablet  Commonly known as:  LASIX  Take 1 tablet (40 mg total) by mouth 2 (two) times daily.     hydrocortisone cream 1 %  Apply 1 application topically 2 (two) times daily. To flaking skin and rash     hydrOXYzine 10 MG tablet  Commonly known as:   ATARAX/VISTARIL  Take 10 mg by mouth at bedtime.     nitroGLYCERIN 0.4 MG SL tablet  Commonly known as:  NITROSTAT  Place 0.4 mg under the tongue every 5 (five) minutes as needed. For chest pain     nystatin powder  Commonly known as:  MYCOSTATIN  Apply topically 4 (four) times daily.     oxyCODONE 5 MG  immediate release tablet  Commonly known as:  Oxy IR/ROXICODONE  Take 1-2 tablets (5-10 mg total) by mouth every 4 (four) hours as needed. Take 1 tablet for moderate pain and 2 tablets for severe pain     pantoprazole 40 MG tablet  Commonly known as:  PROTONIX  Take 40 mg by mouth daily.     polyethylene glycol packet  Commonly known as:  MIRALAX / GLYCOLAX  Take 17 g by mouth daily.     protein supplement Powd  Take 2 scoop by mouth 2 (two) times daily. In beverage     vitamin C 500 MG tablet  Commonly known as:  ASCORBIC ACID  Take 500 mg by mouth at bedtime.          The results of significant diagnostics from this hospitalization (including imaging, microbiology, ancillary and laboratory) are listed below for reference.    Significant Diagnostic Studies: Dg Chest 1 View  10/05/2012  *RADIOLOGY REPORT*  Clinical Data: Leg ulceration.  CHEST - 1 VIEW  Comparison: PA and lateral chest 08/01/2012 and CT chest 06/02/2012.  Findings: Lungs are clear.  Heart size normal.  No pneumothorax or pleural fluid.  Remote fracture surgical neck left humerus is identified.  The patient is status post lower thoracic vertebroplasty.  IMPRESSION: No acute finding.   Original Report Authenticated By: Holley Dexter, M.D.    Dg Pelvis 1-2 Views  10/05/2012  *RADIOLOGY REPORT*  Clinical Data: Right thigh pain.  PELVIS - 1-2 VIEW  Comparison: Pelvic CT 06/02/2012  Findings: Single view of the pelvis was obtained.  The pelvic bony ring is intact.  There are vascular calcifications.  No gross abnormality to either hip.  Nonspecific bowel gas pattern.  Stool in the rectal region.  IMPRESSION: No  acute bony abnormality.   Original Report Authenticated By: Richarda Overlie, M.D.    Dg Femur Right  10/05/2012  *RADIOLOGY REPORT*  Clinical Data: Right thigh pain.  RIGHT FEMUR - 2 VIEW  Comparison: Pelvis 10/05/2012  Findings: Two views of the right femur were obtained.  No evidence for acute fracture.  The right hip is located.  There are vascular calcifications.  Limited evaluation of the knee joint.  IMPRESSION: No evidence for acute fracture.   Original Report Authenticated By: Richarda Overlie, M.D.    Dg Knee 1-2 Views Right  10/05/2012  *RADIOLOGY REPORT*  Clinical Data: Right thigh pain.  Rule out fracture.  RIGHT KNEE - 1-2 VIEW  Comparison: Right femur 10/05/2012  Findings: Two views of the knee were obtained.  The knee is located without acute fracture.  There are enthesopathic changes involving the patella.  Limited evaluation for joint space narrowing.  IMPRESSION: No acute bony abnormality.   Original Report Authenticated By: Richarda Overlie, M.D.    Dg Tibia/fibula Right  10/05/2012  *RADIOLOGY REPORT*  Clinical Data: Large ulceration over the Achilles tendon.  RIGHT TIBIA AND FIBULA - 2 VIEW  Comparison: Plain films right ankle 12/07/2011 and plain films right knee 01/14/2011.  Findings: Skin ulceration over the Achilles tendon is identified. No underlying soft tissue gas collection, radiopaque foreign body or bony destructive change is noted.  Bones are osteopenic.  There is no fracture.  Degenerative disease of the midfoot is noted.  IMPRESSION: Large skin ulceration over the Achilles without plain film evidence of osteomyelitis.   Original Report Authenticated By: Holley Dexter, M.D.    Dg Ankle Complete Right  10/05/2012  *RADIOLOGY REPORT*  Clinical Data: Large skin ulceration over  the Achilles.  RIGHT ANKLE - COMPLETE 3+ VIEW  Comparison: MRI of the right ankle 01/09/2012 and plain films of the right foot 07/23/2012.  Findings: Large skin ulceration over the Achilles is identified. No underlying  radiopaque foreign body, soft tissue gas collection or bony destructive change is noted.  Bones appear osteopenic. Midfoot degenerative disease of the right foot is noted.  IMPRESSION: Large skin ulceration without plain film evidence of osteomyelitis.   Original Report Authenticated By: Holley Dexter, M.D.    Ct Head Wo Contrast  10/05/2012  *RADIOLOGY REPORT*  Clinical Data: Episode of unresponsiveness for 2-3 minutes, history hypertension, coronary artery disease post MI, CHF  CT HEAD WITHOUT CONTRAST  Technique:  Contiguous axial images were obtained from the base of the skull through the vertex without contrast.  Comparison: 12/07/2011  Findings: Generalized atrophy. Normal ventricular morphology. No midline shift or mass effect. Small vessel chronic ischemic changes of deep cerebral white matter. No intracranial hemorrhage or mass lesion. area of low attenuation identified at left middle cerebral peduncle could be related to white matter disease though a developing infarct is not excluded. Tiny old right thalamic and right basal ganglia lacunar infarcts. Scattered streak artifacts from skull base at brain stem and cerebellum. Atherosclerotic calcifications of internal carotid arteries at skull base. Visualized paranasal sinuses and mastoid air cells clear. No acute osseous findings.  IMPRESSION: Atrophy with extensive small vessel chronic ischemic changes of deep cerebral white matter. Tiny old right thalamic and right basal ganglia lacunar infarcts. Question white matter disease changes at the left middle cerebral peduncle though cannot completely exclude infarct.   Original Report Authenticated By: Ulyses Southward, M.D.    Ct Angio Chest Pe W/cm &/or Wo Cm  10/18/2012  *RADIOLOGY REPORT*  Clinical Data: Shortness of breath beginning after recent surgery.  CT ANGIOGRAPHY CHEST  Technique:  Multidetector CT imaging of the chest using the standard protocol during bolus administration of intravenous contrast.  Multiplanar reconstructed images including MIPs were obtained and reviewed to evaluate the vascular anatomy.  Contrast: OMNIPAQUE IOHEXOL 350 MG/ML SOLN  Comparison: 06/02/2012  Findings: Technically adequate study with good opacification of the central and segmental pulmonary arteries.  No focal filling defects.  No evidence of significant pulmonary embolus.  Normal heart size.  Calcification in the mitral valve annulus and aortic valve.  Calcification in the coronary arteries.  Calcification in the thoracic aorta.  Normal caliber thoracic aorta.  Prominent but nonpathologically enlarged lymph nodes are demonstrated throughout the mediastinum and in both axilla.  This is stable since the previous study in these are likely reactive.  The esophagus is decompressed.  Visualized portions of the upper abdominal organs are unremarkable except for vascular calcifications which are extensive.  There are small bilateral pleural effusions with mild bilateral basilar atelectasis.  Visualization of the lung parenchyma is limited due to respiratory motion artifact but there is no specific consolidation or airspace disease noted.  Bronchial wall thickening suggesting chronic bronchitis.  Airways appear patent.  No pneumothorax.  Degenerative changes throughout the thoracic spine. Normal alignment.  Compression of T12 post kyphoplasty.  IMPRESSION: No evidence of significant pulmonary embolus.  Small bilateral pleural effusions with basilar atelectasis.  Extensive vascular calcifications.   Original Report Authenticated By: Burman Nieves, M.D.    Mr Laqueta Jean Wo Contrast  10/05/2012  *RADIOLOGY REPORT*  Clinical Data: Syncope with right leg hemiparesis.  Atrial fibrillation  MRI HEAD WITHOUT AND WITH CONTRAST  Technique:  Multiplanar, multiecho pulse sequences  of the brain and surrounding structures were obtained according to standard protocol without and with intravenous contrast  Contrast: 12mL MULTIHANCE GADOBENATE  DIMEGLUMINE 529 MG/ML IV SOLN  Comparison: CT head 10/05/2012  Findings: Negative for acute infarct.  Chronic microvascular ischemic changes have progressed since the prior MRI of 12/08/2011.  There is diffuse chronic ischemia throughout the cerebral white matter.  There is chronic ischemia in the thalami and basal ganglia,  mid brain and pons bilaterally.  CT abnormality in the left mid brain is chronic.  Negative for intracranial hemorrhage.  No mass or edema.  Postcontrast imaging of the brain reveals normal enhancement.  No enhancing mass lesion is present.  Mild mucosal thickening right maxillary sinus.  IMPRESSION: No acute infarct.  Advanced chronic microvascular ischemia, with progression from 12/08/2011.   Original Report Authenticated By: Janeece Riggers, M.D.    Dg Chest Portable 1 View  10/18/2012  *RADIOLOGY REPORT*  Clinical Data: Shortness of breath.  PORTABLE CHEST - 1 VIEW  Comparison: 10/05/2012 chest x-ray.  06/02/2012 chest CT.  Findings: Hazy opacity right infrahilar region may represent overlapping structures.  Subtle right infrahilar infiltrate not entirely excluded.  Central pulmonary vascular prominence.  No pulmonary edema or gross pneumothorax.  Calcified aorta.  Heart size within normal limits.  CT detected tiny noncalcified nodules not appreciated on this present plain film exam.  IMPRESSION: Hazy opacity right infrahilar region may represent overlapping structures.  Subtle right infrahilar infiltrate not entirely excluded.   Original Report Authenticated By: Lacy Duverney, M.D.     Microbiology: Recent Results (from the past 240 hour(s))  MRSA PCR SCREENING     Status: Abnormal   Collection Time    10/19/12  2:02 AM      Result Value Range Status   MRSA by PCR POSITIVE (*) NEGATIVE Final   Comment:            The GeneXpert MRSA Assay (FDA     approved for NASAL specimens     only), is one component of a     comprehensive MRSA colonization     surveillance program. It is not      intended to diagnose MRSA     infection nor to guide or     monitor treatment for     MRSA infections.     RESULT CALLED TO, READ BACK BY AND VERIFIED WITH:     DUNDON,A RN 0513 10/19/12 MITCHELL,L     Labs: Basic Metabolic Panel:  Recent Labs Lab 10/18/12 1929 10/18/12 2012 10/19/12 1058 10/20/12 0530 10/21/12 0530  NA 131* 134* 132* 132* 130*  K 4.7 4.7 3.7 4.2 4.1  CL 97 100 94* 94* 91*  CO2 24  --  28 30 31   GLUCOSE 119* 120* 148* 90 99  BUN 24* 28* 20 25* 31*  CREATININE 0.62 0.80 0.54 0.59 0.58  CALCIUM 8.8  --  8.8 9.0 9.2   Liver Function Tests:  Recent Labs Lab 10/18/12 1929  AST 28  ALT 16  ALKPHOS 48  BILITOT 0.2*  PROT 5.9*  ALBUMIN 2.3*   CBC:  Recent Labs Lab 10/18/12 1929 10/18/12 2012 10/19/12 0621 10/20/12 0530 10/21/12 0530  WBC 23.6*  --  24.4* 23.9* 26.1*  NEUTROABS 8.7*  --  9.0*  --   --   HGB 9.9* 10.5* 10.7* 11.1* 11.5*  HCT 30.5* 31.0* 32.7* 34.0* 35.1*  MCV 87.9  --  87.0 87.6 87.5  PLT 542*  --  549* 554*  547*   Cardiac Enzymes:  Recent Labs Lab 10/18/12 2010 10/19/12 0020 10/19/12 0621 10/19/12 1058  TROPONINI <0.30 <0.30 <0.30 <0.30   BNP: BNP (last 3 results)  Recent Labs  03/24/12 0400 10/05/12 1545 10/18/12 2010  PROBNP 2150.0* 3200.0* 4481.0*   CBG: No results found for this basename: GLUCAP,  in the last 168 hours     Signed:  Amilee Janvier  [facility] 10/21/2012, 9:16 AM   Pressure ulcer stage 1, Also patient is bedbaund in setting of recent surgery, AKA.

## 2012-11-01 ENCOUNTER — Emergency Department (HOSPITAL_COMMUNITY): Payer: Medicare Other

## 2012-11-01 ENCOUNTER — Observation Stay (HOSPITAL_COMMUNITY)
Admission: EM | Admit: 2012-11-01 | Discharge: 2012-11-04 | Disposition: A | Discharge status: Skilled Nursing Facility | Payer: Medicare Other | Attending: Internal Medicine | Admitting: Internal Medicine

## 2012-11-01 DIAGNOSIS — M79651 Pain in right thigh: Secondary | ICD-10-CM

## 2012-11-01 DIAGNOSIS — Z9181 History of falling: Secondary | ICD-10-CM | POA: Insufficient documentation

## 2012-11-01 DIAGNOSIS — I5032 Chronic diastolic (congestive) heart failure: Secondary | ICD-10-CM

## 2012-11-01 DIAGNOSIS — B965 Pseudomonas (aeruginosa) (mallei) (pseudomallei) as the cause of diseases classified elsewhere: Secondary | ICD-10-CM | POA: Insufficient documentation

## 2012-11-01 DIAGNOSIS — J189 Pneumonia, unspecified organism: Secondary | ICD-10-CM

## 2012-11-01 DIAGNOSIS — I4891 Unspecified atrial fibrillation: Secondary | ICD-10-CM

## 2012-11-01 DIAGNOSIS — R4182 Altered mental status, unspecified: Secondary | ICD-10-CM | POA: Insufficient documentation

## 2012-11-01 DIAGNOSIS — S78119A Complete traumatic amputation at level between unspecified hip and knee, initial encounter: Secondary | ICD-10-CM | POA: Insufficient documentation

## 2012-11-01 DIAGNOSIS — D72829 Elevated white blood cell count, unspecified: Principal | ICD-10-CM

## 2012-11-01 DIAGNOSIS — I509 Heart failure, unspecified: Secondary | ICD-10-CM | POA: Insufficient documentation

## 2012-11-01 DIAGNOSIS — G459 Transient cerebral ischemic attack, unspecified: Secondary | ICD-10-CM

## 2012-11-01 DIAGNOSIS — R509 Fever, unspecified: Secondary | ICD-10-CM

## 2012-11-01 DIAGNOSIS — I480 Paroxysmal atrial fibrillation: Secondary | ICD-10-CM | POA: Diagnosis present

## 2012-11-01 DIAGNOSIS — E860 Dehydration: Secondary | ICD-10-CM

## 2012-11-01 DIAGNOSIS — I10 Essential (primary) hypertension: Secondary | ICD-10-CM

## 2012-11-01 DIAGNOSIS — I252 Old myocardial infarction: Secondary | ICD-10-CM | POA: Insufficient documentation

## 2012-11-01 DIAGNOSIS — C8400 Mycosis fungoides, unspecified site: Secondary | ICD-10-CM | POA: Diagnosis present

## 2012-11-01 DIAGNOSIS — R55 Syncope and collapse: Secondary | ICD-10-CM

## 2012-11-01 DIAGNOSIS — Z8673 Personal history of transient ischemic attack (TIA), and cerebral infarction without residual deficits: Secondary | ICD-10-CM | POA: Insufficient documentation

## 2012-11-01 DIAGNOSIS — Z79899 Other long term (current) drug therapy: Secondary | ICD-10-CM | POA: Insufficient documentation

## 2012-11-01 DIAGNOSIS — L0390 Cellulitis, unspecified: Secondary | ICD-10-CM

## 2012-11-01 DIAGNOSIS — I739 Peripheral vascular disease, unspecified: Secondary | ICD-10-CM

## 2012-11-01 DIAGNOSIS — I5033 Acute on chronic diastolic (congestive) heart failure: Secondary | ICD-10-CM

## 2012-11-01 DIAGNOSIS — E871 Hypo-osmolality and hyponatremia: Secondary | ICD-10-CM | POA: Insufficient documentation

## 2012-11-01 DIAGNOSIS — N390 Urinary tract infection, site not specified: Secondary | ICD-10-CM

## 2012-11-01 DIAGNOSIS — R0602 Shortness of breath: Secondary | ICD-10-CM

## 2012-11-01 DIAGNOSIS — C8404 Mycosis fungoides, lymph nodes of axilla and upper limb: Secondary | ICD-10-CM

## 2012-11-01 DIAGNOSIS — I639 Cerebral infarction, unspecified: Secondary | ICD-10-CM

## 2012-11-01 DIAGNOSIS — J9600 Acute respiratory failure, unspecified whether with hypoxia or hypercapnia: Secondary | ICD-10-CM

## 2012-11-01 DIAGNOSIS — D649 Anemia, unspecified: Secondary | ICD-10-CM

## 2012-11-01 DIAGNOSIS — R079 Chest pain, unspecified: Secondary | ICD-10-CM

## 2012-11-01 DIAGNOSIS — M25571 Pain in right ankle and joints of right foot: Secondary | ICD-10-CM

## 2012-11-01 DIAGNOSIS — I959 Hypotension, unspecified: Secondary | ICD-10-CM

## 2012-11-01 DIAGNOSIS — S81801A Unspecified open wound, right lower leg, initial encounter: Secondary | ICD-10-CM

## 2012-11-01 LAB — BASIC METABOLIC PANEL
Calcium: 9.1 mg/dL (ref 8.4–10.5)
Chloride: 92 mEq/L — ABNORMAL LOW (ref 96–112)
Creatinine, Ser: 0.63 mg/dL (ref 0.50–1.10)
GFR calc Af Amer: 88 mL/min — ABNORMAL LOW (ref >90)
GFR calc non Af Amer: 76 mL/min — ABNORMAL LOW (ref >90)

## 2012-11-01 LAB — CBC WITH DIFFERENTIAL/PLATELET
Basophils Absolute: 0 10*3/uL (ref 0.0–0.1)
Lymphs Abs: 15.4 10*3/uL — ABNORMAL HIGH (ref 0.7–4.0)
MCV: 86.4 fL (ref 78.0–100.0)
Monocytes Relative: 3 % (ref 3–12)
Platelets: 328 10*3/uL (ref 150–400)
RDW: 15.3 % (ref 11.5–15.5)
WBC: 23 10*3/uL — ABNORMAL HIGH (ref 4.0–10.5)

## 2012-11-01 LAB — URINALYSIS, ROUTINE W REFLEX MICROSCOPIC
Bilirubin Urine: NEGATIVE
Glucose, UA: NEGATIVE mg/dL
pH: 5.5 (ref 5.0–8.0)

## 2012-11-01 MED ORDER — SODIUM CHLORIDE 0.9 % IV BOLUS (SEPSIS)
250.0000 mL | Freq: Once | INTRAVENOUS | Status: AC
  Administered 2012-11-01: 250 mL via INTRAVENOUS

## 2012-11-01 MED ORDER — LORAZEPAM 2 MG/ML IJ SOLN
1.0000 mg | Freq: Once | INTRAMUSCULAR | Status: AC
  Administered 2012-11-01: 1 mg via INTRAVENOUS
  Filled 2012-11-01: qty 1

## 2012-11-01 MED ORDER — SODIUM CHLORIDE 0.9 % IV SOLN
INTRAVENOUS | Status: DC
  Administered 2012-11-01: 22:00:00 via INTRAVENOUS

## 2012-11-01 MED ORDER — CIPROFLOXACIN IN D5W 400 MG/200ML IV SOLN
400.0000 mg | Freq: Once | INTRAVENOUS | Status: AC
Start: 2012-11-01 — End: 2012-11-02
  Administered 2012-11-01: 400 mg via INTRAVENOUS
  Filled 2012-11-01: qty 200

## 2012-11-01 MED ORDER — FENTANYL CITRATE 0.05 MG/ML IJ SOLN
50.0000 ug | Freq: Once | INTRAMUSCULAR | Status: AC
  Administered 2012-11-01: 50 ug via INTRAVENOUS
  Filled 2012-11-01: qty 2

## 2012-11-01 NOTE — ED Notes (Signed)
Pt unhooked from monitor for Radiology

## 2012-11-01 NOTE — ED Notes (Signed)
Unable to obtain IV access on pt.  IV team paged.

## 2012-11-01 NOTE — ED Notes (Signed)
Unable to obtain in and out urine sample from pt due to pt resistance.  Attempted 3 times with 4 different staff members.  EDP Wentz notified.  Obtained verbal order for ativan and fentanyl to calm pt and will reattempt.

## 2012-11-01 NOTE — ED Notes (Signed)
Per EMS - pt coming from [facility]. At 2pm staff noticed pt was having difficulty speaking but no other neuro deficits. Pt had been c/o minor HA throughout the day. Equal hand grips. No facial droop. Hx of TIA and CVA no neuro deficits. Right BKA. Pt a&ox4. Left pupil larger than right. Not normal for pt. Both reactive. BP 140/59 HR 88 CBG 120.

## 2012-11-01 NOTE — ED Notes (Signed)
Patient transported to CT 

## 2012-11-01 NOTE — ED Notes (Signed)
Pt back from CT, IV team in with pt.

## 2012-11-01 NOTE — ED Notes (Signed)
Unsuccessful attempt for in & out cath. Dr Effie Shy informed. Dr ordering meds to calm pt.

## 2012-11-01 NOTE — ED Provider Notes (Signed)
History     CSN: [id number]  Arrival date & time 11/01/12  2004   First MD Initiated Contact with Patient 11/01/12 2018      No chief complaint on file.   (Consider location/radiation/quality/duration/timing/severity/associated sxs/prior treatment) HPI Comments: Sara [name] is a 79 y.o. Female who was sent here for evaluation of speaking , trouble, headache, and unequal pupils. The symptoms apparently started today, at an unknown time. The patient cannnot give any history.   Level V caveat-altered mental status  The history is provided by the patient.    Past Medical History  Diagnosis Date  . Hypertension   . Atrial fibrillation   . Psoriasis   . Congestive heart failure (CHF)   . History of CVA (cerebrovascular accident)   . Coronary artery disease   . Myocardial infarction   . CHF (congestive heart failure)   . Shortness of breath   . H/O hiatal hernia   . Closed fracture of head of left humerus 5/12  . Basal cell carcinoma   . 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  Flow cytometry 04/04/12: 91% cells CD4 positive CD 26 negative  . Hyperlipemia   . Pneumonia   . Wound of right leg 09/25/2012    Necrotic, open wound right lower leg; non healing  . Stroke     Past Surgical History  Procedure Laterality Date  . Tonsillectomy    . Dilation and curettage of uterus    . Eye surgery    . Cataracts    . Vertebroplasty    . Amputation  10/10/2012    Procedure: AMPUTATION ABOVE KNEE;  Surgeon: Nadara Mustard, MD;  Location: [facility] OR;  Service: Orthopedics;  Laterality: Right;    Family History  Problem Relation Age of Onset  . Stomach cancer Mother   . Pneumonia Father     History  Substance Use Topics  . Smoking status: Never Smoker   . Smokeless tobacco: Never Used  . Alcohol Use: No    OB History   Grav Para Term  Preterm Abortions TAB SAB Ect Mult Living                  Review of Systems  Unable to perform ROS   Allergies  Penicillins; Triamterene; and Alendronate sodium  Home Medications   No current outpatient prescriptions on file.  BP 159/69  Pulse 75  Temp(Src) 98 F (36.7 C) (Oral)  Resp 16  Ht 5\' 7"  (1.702 m)  Wt 133 lb 9.6 oz (60.6 kg)  BMI 20.92 kg/m2  SpO2 100%  Physical Exam  Nursing note and vitals reviewed. Constitutional: She appears well-developed.  Elderly, frail  HENT:  Head: Normocephalic and atraumatic.  Eyes: Conjunctivae and EOM are normal. Pupils are equal, round, and reactive to light. Right eye exhibits no discharge. Left eye exhibits no discharge.  Left pupil 4 mm, right pupil 3 mm both pupils reactive to light. EOMI  Neck: Normal range of motion and phonation normal. Neck supple.  Cardiovascular: Normal rate, regular rhythm and intact distal pulses.   Pulmonary/Chest: Effort normal and breath sounds normal. She exhibits no tenderness.  Abdominal: Soft. She exhibits no distension. There is no tenderness. There is no guarding.  Musculoskeletal: Normal range of motion.  Neurological: She is alert. She has normal strength. She exhibits normal muscle tone.  Confused. She states that she was "sailing in bed tonight". No dysarthria. No expressive aphasia. She follows simple commands.  Skin: Skin is warm and dry.  Psychiatric: Her behavior is normal.    ED Course  Procedures (including critical care time)  Emergency department treatment: IV fluid bolus, and drip.  Patient resisted efforts to get an in and out urinary catheterization. She had pain with this attempt. She was treated with Ativan and fentanyl, and was then able to be catheterized.  IV, Cipro, given for UTI  Discussed with [facility] hospitalist, to arrange admission  Labs Reviewed  CBC WITH DIFFERENTIAL - Abnormal; Notable for the following:    WBC 23.0 (*)    Neutrophils Relative 29 (*)     Lymphocytes Relative 67 (*)    Lymphs Abs 15.4 (*)    All other components within normal limits  BASIC METABOLIC PANEL - Abnormal; Notable for the following:    Sodium 129 (*)    Chloride 92 (*)    Glucose, Bld 106 (*)    BUN 29 (*)    GFR calc non Af Amer 76 (*)    GFR calc Af Amer 88 (*)    All other components within normal limits  URINALYSIS, ROUTINE W REFLEX MICROSCOPIC - Abnormal; Notable for the following:    Hgb urine dipstick SMALL (*)    Leukocytes, UA SMALL (*)    All other components within normal limits  COMPREHENSIVE METABOLIC PANEL - Abnormal; Notable for the following:    Sodium 131 (*)    Chloride 93 (*)    Creatinine, Ser 0.48 (*)    Total Protein 5.8 (*)    Albumin 2.6 (*)    Total Bilirubin 0.2 (*)    GFR calc non Af Amer 83 (*)    All other components within normal limits  CBC WITH DIFFERENTIAL - Abnormal; Notable for the following:    WBC 25.5 (*)    Hemoglobin 11.8 (*)    HCT 35.1 (*)    Neutrophils Relative 36 (*)    Lymphocytes Relative 57 (*)    Neutro Abs 9.2 (*)    Lymphs Abs 14.5 (*)    Eosinophils Absolute 0.8 (*)    All other components within normal limits  HEMOGLOBIN A1C - Abnormal; Notable for the following:    Hemoglobin A1C 5.7 (*)    Mean Plasma Glucose 117 (*)    All other components within normal limits  LIPID PANEL - Abnormal; Notable for the following:    Cholesterol 203 (*)    Triglycerides 175 (*)    HDL 30 (*)    LDL Cholesterol 138 (*)    All other components within normal limits  GLUCOSE, CAPILLARY - Abnormal; Notable for the following:    Glucose-Capillary 107 (*)    All other components within normal limits  GLUCOSE, CAPILLARY - Abnormal; Notable for the following:    Glucose-Capillary 118 (*)    All other components within normal limits  CBC - Abnormal; Notable for the following:    WBC 23.4 (*)    RBC 3.82 (*)    Hemoglobin 11.2 (*)    HCT 33.2 (*)    All other components within normal limits  CREATININE,  SERUM - Abnormal; Notable for the following:    GFR calc non Af Amer 75 (*)    GFR calc Af Amer 87 (*)    All other components within normal limits  URINE CULTURE  URINE MICROSCOPIC-ADD ON  TSH  INFLUENZA PANEL BY PCR  URINE RAPID DRUG SCREEN (HOSP PERFORMED)   Ct Head Wo Contrast  11/01/2012  *RADIOLOGY REPORT*  Clinical Data: With confusion, headache  CT HEAD WITHOUT CONTRAST  Technique:  Contiguous axial images were obtained from the base of the skull through the vertex without contrast.  Comparison: Prior MRI of the brain 10/05/2012; prior head CT 10/05/2012  Findings: No acute intracranial hemorrhage, acute infarction, mass lesion, mass effect, midline shift or hydrocephalus.  Gray-white differentiation is preserved throughout.  Similar pattern of extensive periventricular, subcortical and deep white matter hypoattenuation which is nonspecific but most consistent with the sequela of longstanding microvascular ischemic disease.  Stable remote lacunar infarcts and the right thalamus and external capsule.  Table hypoattenuation in the left mid brain.  Mild cerebral and cerebellar atrophy.  No acute soft tissue, or calvarial abnormality.  Normal aeration of the mastoid air cells and paranasal sinuses.  IMPRESSION:  1.  No acute intracranial abnormality. 2.  Extensive small vessel chronic ischemic changes and prior right basal ganglia lacunar infarcts appear unchanged.   Original Report Authenticated By: Malachy Moan, M.D.    Mri Brain Without Contrast  11/02/2012  *RADIOLOGY REPORT*  Clinical Data: Possible TIA  MRI HEAD WITHOUT CONTRAST  Technique:  Multiplanar, multiecho pulse sequences of the brain and surrounding structures were obtained according to standard protocol without intravenous contrast.  Comparison: CT 11/01/2012, MRI 10/05/2012  Findings: Generalized atrophy.  Advanced chronic microvascular ischemic changes are present throughout the cerebral white matter, basal ganglia, and pons.   Mild chronic ischemia in the cerebellum bilaterally.  These findings are unchanged from the prior MRI.  Negative for acute infarct.  Negative for hemorrhage or mass lesion.  Paranasal sinuses are clear.  IMPRESSION: Advanced chronic microvascular ischemic changes and atrophy.  No acute abnormality and no change from the recent MRI.   Original Report Authenticated By: Janeece Riggers, M.D.    Dg Chest Port 1 View  11/01/2012  *RADIOLOGY REPORT*  Clinical Data: Confusion.  Atrial fibrillation.  PORTABLE CHEST - 1 VIEW  Comparison: 10/05/2012.  Findings: The cardiac silhouette, mediastinal and hilar contours are normal and stable.  There is tortuosity and calcification of the thoracic aorta.  The lungs are clear.  Chronic lung changes. Remote healed humeral neck fracture noted on the left.  IMPRESSION: No acute cardiopulmonary findings.  Chronic lung changes.   Original Report Authenticated By: Rudie Meyer, M.D.      1. UTI (lower urinary tract infection)   2. Fever   3. HTN (hypertension)   4. TIA (transient ischemic attack)   5. Anemia   6. Chronic diastolic HF (heart failure)       MDM  Elderly, female, with confusion, and urinary tract infection. Vital signs are normal and stable. She was recently admitted for CHF exacerbation. Prior to that, she recently had a right leg, above knee amputation for a chronic wound. He is moderately debilitated. She has marked white blood cell count elevation. No frank sepsis.   Plan: Admit     Flint Melter, MD 11/02/12 [phone]

## 2012-11-02 ENCOUNTER — Inpatient Hospital Stay (HOSPITAL_COMMUNITY): Payer: Medicare Other

## 2012-11-02 ENCOUNTER — Encounter (HOSPITAL_COMMUNITY): Payer: Self-pay | Admitting: Internal Medicine

## 2012-11-02 DIAGNOSIS — N390 Urinary tract infection, site not specified: Secondary | ICD-10-CM | POA: Diagnosis present

## 2012-11-02 DIAGNOSIS — D649 Anemia, unspecified: Secondary | ICD-10-CM

## 2012-11-02 DIAGNOSIS — I5032 Chronic diastolic (congestive) heart failure: Secondary | ICD-10-CM

## 2012-11-02 DIAGNOSIS — G459 Transient cerebral ischemic attack, unspecified: Secondary | ICD-10-CM

## 2012-11-02 DIAGNOSIS — I10 Essential (primary) hypertension: Secondary | ICD-10-CM

## 2012-11-02 DIAGNOSIS — R509 Fever, unspecified: Secondary | ICD-10-CM | POA: Diagnosis present

## 2012-11-02 LAB — LIPID PANEL
Cholesterol: 203 mg/dL — ABNORMAL HIGH (ref 0–200)
LDL Cholesterol: 138 mg/dL — ABNORMAL HIGH (ref 0–99)
VLDL: 35 mg/dL (ref 0–40)

## 2012-11-02 LAB — CBC WITH DIFFERENTIAL/PLATELET
Basophils Relative: 0 % (ref 0–1)
Eosinophils Relative: 3 % (ref 0–5)
Hemoglobin: 11.8 g/dL — ABNORMAL LOW (ref 12.0–15.0)
Lymphs Abs: 14.5 10*3/uL — ABNORMAL HIGH (ref 0.7–4.0)
MCV: 86.2 fL (ref 78.0–100.0)
Monocytes Relative: 4 % (ref 3–12)
Platelets: 323 10*3/uL (ref 150–400)
RBC: 4.07 MIL/uL (ref 3.87–5.11)
WBC: 25.5 10*3/uL — ABNORMAL HIGH (ref 4.0–10.5)

## 2012-11-02 LAB — RAPID URINE DRUG SCREEN, HOSP PERFORMED
Benzodiazepines: NOT DETECTED
Cocaine: NOT DETECTED

## 2012-11-02 LAB — INFLUENZA PANEL BY PCR (TYPE A & B)
H1N1 flu by pcr: NOT DETECTED
Influenza B By PCR: NEGATIVE

## 2012-11-02 LAB — COMPREHENSIVE METABOLIC PANEL
Albumin: 2.6 g/dL — ABNORMAL LOW (ref 3.5–5.2)
BUN: 22 mg/dL (ref 6–23)
Calcium: 9.5 mg/dL (ref 8.4–10.5)
Chloride: 93 mEq/L — ABNORMAL LOW (ref 96–112)
Creatinine, Ser: 0.48 mg/dL — ABNORMAL LOW (ref 0.50–1.10)
Total Bilirubin: 0.2 mg/dL — ABNORMAL LOW (ref 0.3–1.2)
Total Protein: 5.8 g/dL — ABNORMAL LOW (ref 6.0–8.3)

## 2012-11-02 LAB — CREATININE, SERUM
Creatinine, Ser: 0.65 mg/dL (ref 0.50–1.10)
GFR calc non Af Amer: 75 mL/min — ABNORMAL LOW (ref >90)

## 2012-11-02 LAB — CBC
HCT: 33.2 % — ABNORMAL LOW (ref 36.0–46.0)
MCHC: 33.7 g/dL (ref 30.0–36.0)
Platelets: 301 10*3/uL (ref 150–400)
RDW: 15.5 % (ref 11.5–15.5)

## 2012-11-02 LAB — GLUCOSE, CAPILLARY: Glucose-Capillary: 118 mg/dL — ABNORMAL HIGH (ref 70–99)

## 2012-11-02 LAB — TSH: TSH: 1.519 u[IU]/mL (ref 0.350–4.500)

## 2012-11-02 MED ORDER — CARVEDILOL 12.5 MG PO TABS
12.5000 mg | ORAL_TABLET | Freq: Two times a day (BID) | ORAL | Status: DC
  Administered 2012-11-02 – 2012-11-04 (×5): 12.5 mg via ORAL
  Filled 2012-11-02 (×7): qty 1

## 2012-11-02 MED ORDER — CIPROFLOXACIN IN D5W 400 MG/200ML IV SOLN: 400.0000 mg | Freq: Two times a day (BID) | INTRAVENOUS | Status: DC

## 2012-11-02 MED ORDER — LISINOPRIL 5 MG PO TABS
5.0000 mg | ORAL_TABLET | Freq: Every day | ORAL | Status: DC
  Administered 2012-11-02 – 2012-11-03 (×2): 5 mg via ORAL
  Filled 2012-11-02 (×3): qty 1

## 2012-11-02 MED ORDER — BENEPROTEIN PO POWD
2.0000 | Freq: Two times a day (BID) | ORAL | Status: DC
  Administered 2012-11-02 – 2012-11-04 (×4): 12 g via ORAL
  Filled 2012-11-02: qty 227

## 2012-11-02 MED ORDER — FOLIC ACID 1 MG PO TABS
1.0000 mg | ORAL_TABLET | Freq: Every day | ORAL | Status: DC
  Administered 2012-11-02 – 2012-11-04 (×3): 1 mg via ORAL
  Filled 2012-11-02 (×3): qty 1

## 2012-11-02 MED ORDER — HYDROXYZINE HCL 10 MG PO TABS
10.0000 mg | ORAL_TABLET | Freq: Every day | ORAL | Status: DC
  Administered 2012-11-02 (×2): 10 mg via ORAL
  Filled 2012-11-02 (×5): qty 1

## 2012-11-02 MED ORDER — OXYCODONE HCL 5 MG PO TABS
5.0000 mg | ORAL_TABLET | ORAL | Status: DC | PRN
  Administered 2012-11-03: 5 mg via ORAL
  Filled 2012-11-02: qty 1

## 2012-11-02 MED ORDER — CLOPIDOGREL BISULFATE 75 MG PO TABS
75.0000 mg | ORAL_TABLET | Freq: Every day | ORAL | Status: DC
  Administered 2012-11-02 – 2012-11-04 (×3): 75 mg via ORAL
  Filled 2012-11-02 (×5): qty 1

## 2012-11-02 MED ORDER — POLYETHYLENE GLYCOL 3350 17 G PO PACK
17.0000 g | PACK | Freq: Every day | ORAL | Status: DC
  Administered 2012-11-02 – 2012-11-04 (×3): 17 g via ORAL
  Filled 2012-11-02 (×3): qty 1

## 2012-11-02 MED ORDER — DOCUSATE SODIUM 100 MG PO CAPS
100.0000 mg | ORAL_CAPSULE | Freq: Every day | ORAL | Status: DC
  Administered 2012-11-02 – 2012-11-04 (×3): 100 mg via ORAL
  Filled 2012-11-02: qty 1

## 2012-11-02 MED ORDER — VITAMIN C 500 MG PO TABS
500.0000 mg | ORAL_TABLET | Freq: Every day | ORAL | Status: DC
  Administered 2012-11-02 – 2012-11-03 (×2): 500 mg via ORAL
  Filled 2012-11-02 (×5): qty 1

## 2012-11-02 MED ORDER — NITROGLYCERIN 0.4 MG SL SUBL: 0.4000 mg | SUBLINGUAL_TABLET | SUBLINGUAL | Status: DC | PRN

## 2012-11-02 MED ORDER — DILTIAZEM HCL ER COATED BEADS 120 MG PO CP24
120.0000 mg | ORAL_CAPSULE | Freq: Every day | ORAL | Status: DC
  Administered 2012-11-02 – 2012-11-04 (×3): 120 mg via ORAL
  Filled 2012-11-02 (×3): qty 1

## 2012-11-02 MED ORDER — ASPIRIN 81 MG PO CHEW
81.0000 mg | CHEWABLE_TABLET | Freq: Every morning | ORAL | Status: DC
  Administered 2012-11-02 – 2012-11-04 (×3): 81 mg via ORAL
  Filled 2012-11-02 (×3): qty 1

## 2012-11-02 MED ORDER — HEPARIN SODIUM (PORCINE) 5000 UNIT/ML IJ SOLN
5000.0000 [IU] | Freq: Three times a day (TID) | INTRAMUSCULAR | Status: DC
  Administered 2012-11-02 – 2012-11-04 (×5): 5000 [IU] via SUBCUTANEOUS
  Filled 2012-11-02 (×10): qty 1

## 2012-11-02 MED ORDER — SODIUM CHLORIDE 0.9 % IV SOLN
INTRAVENOUS | Status: AC
  Administered 2012-11-02: 02:00:00 via INTRAVENOUS

## 2012-11-02 MED ORDER — FUROSEMIDE 40 MG PO TABS
40.0000 mg | ORAL_TABLET | Freq: Two times a day (BID) | ORAL | Status: DC
  Administered 2012-11-02 – 2012-11-04 (×5): 40 mg via ORAL
  Filled 2012-11-02 (×7): qty 1

## 2012-11-02 MED ORDER — CIPROFLOXACIN HCL 250 MG PO TABS
250.0000 mg | ORAL_TABLET | Freq: Two times a day (BID) | ORAL | Status: DC
  Administered 2012-11-02 – 2012-11-04 (×5): 250 mg via ORAL
  Filled 2012-11-02 (×7): qty 1

## 2012-11-02 MED ORDER — BEXAROTENE 75 MG PO CAPS
75.0000 mg | ORAL_CAPSULE | Freq: Every day | ORAL | Status: DC
  Administered 2012-11-02 – 2012-11-03 (×2): 75 mg via ORAL
  Filled 2012-11-02 (×5): qty 1

## 2012-11-02 MED ORDER — FERROUS SULFATE 325 (65 FE) MG PO TABS
325.0000 mg | ORAL_TABLET | Freq: Two times a day (BID) | ORAL | Status: DC
  Administered 2012-11-02 – 2012-11-04 (×5): 325 mg via ORAL
  Filled 2012-11-02 (×6): qty 1

## 2012-11-02 MED ORDER — OMEGA-3-ACID ETHYL ESTERS 1 G PO CAPS
1000.0000 mg | ORAL_CAPSULE | Freq: Every day | ORAL | Status: DC
  Administered 2012-11-02 – 2012-11-03 (×2): 1000 mg via ORAL
  Filled 2012-11-02 (×3): qty 1

## 2012-11-02 NOTE — Progress Notes (Signed)
ANTIBIOTIC CONSULT NOTE - INITIAL  Pharmacy Consult for ciprofloxacin Indication: R/o UTI  Allergies  Allergen Reactions  . Penicillins Other (See Comments)    unknown  . Triamterene Other (See Comments)    unknown  . Alendronate Sodium Rash    Pt stated oh k to use biotene    Patient Measurements: Height: 5\' 7"  (170.2 cm) Weight: 133 lb 9.6 oz (60.6 kg) (Per 10/21/12 documentation) IBW/kg (Calculated) : 61.6  Vital Signs: Temp: 100.1 F (37.8 C) (02/19 2237) Temp src: Rectal (02/19 2237) BP: 155/81 mmHg (02/20 0000) Pulse Rate: 72 (02/20 0000) Intake/Output from previous day:   Intake/Output from this shift:    Labs:  Recent Labs  11/01/12 2027  WBC 23.0*  HGB 12.8  PLT 328  CREATININE 0.63   Estimated Creatinine Clearance: 43.8 ml/min (by C-G formula based on Cr of 0.63). No results found for this basename: VANCOTROUGH, VANCOPEAK, VANCORANDOM, GENTTROUGH, GENTPEAK, GENTRANDOM, TOBRATROUGH, TOBRAPEAK, TOBRARND, AMIKACINPEAK, AMIKACINTROU, AMIKACIN,  in the last 72 hours   Microbiology: No results found for this or any previous visit (from the past 720 hour(s)).  Medical History: Past Medical History  Diagnosis Date  . Hypertension   . Atrial fibrillation   . Psoriasis   . Congestive heart failure (CHF)   . History of CVA (cerebrovascular accident)   . Coronary artery disease   . Myocardial infarction   . CHF (congestive heart failure)   . Shortness of breath   . H/O hiatal hernia   . Closed fracture of head of left humerus 5/12  . Basal cell carcinoma   . 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  Flow cytometry 04/04/12: 91% cells CD4 positive CD 26 negative  . Hyperlipemia   . Pneumonia   . Wound of right leg 09/25/2012    Necrotic, open wound right lower leg; non healing  . Stroke     Medications:   Scheduled:  . aspirin  81 mg Oral q morning - 10a  . bexarotene  75 mg Oral QAC supper  . carvedilol  12.5 mg Oral BID WC  . clopidogrel  75 mg Oral Daily  . diltiazem  120 mg Oral Daily  . docusate sodium  100 mg Oral Daily  . [COMPLETED] fentaNYL  50 mcg Intravenous Once  . ferrous sulfate  325 mg Oral BID  . Fish Oil  1,000 mg Oral QHS  . folic acid  1 mg Oral Daily  . heparin  5,000 Units Subcutaneous Q8H  . hydrOXYzine  10 mg Oral QHS  . lisinopril  5 mg Oral Daily  . [COMPLETED] LORazepam  1 mg Intravenous Once  . polyethylene glycol  17 g Oral Daily  . protein supplement  2 scoop Oral BID  . vitamin C  500 mg Oral QHS   Assessment: 79 yo female who presented with confusion. Pharmacy to manage ciprofloxacin for r/o UTI.  Plan:  1. Ciprofloxacin 400mg  IV Q12H.   Sara [name] 11/02/2012,12:50 AM

## 2012-11-02 NOTE — Clinical Social Work Psychosocial (Signed)
Clinical Social Work Department BRIEF PSYCHOSOCIAL ASSESSMENT 11/02/2012  Patient:  Sara [name], Sara [name]     Account Number:  [id number]     Admit date:  11/01/2012  Clinical Social Worker:  Delmer Islam  Date/Time:  11/02/2012 12:29 PM  Referred by:  CSW  Date Referred:  11/02/2012 Referred for  SNF Placement   Other Referral:   Interview type:  Patient Other interview type:    PSYCHOSOCIAL DATA Living Status:  FACILITY Admitted from facility:  [facility] Level of care:  Independent Living Primary support name:  Sheran Spine Primary support relationship to patient:  CHILD, ADULT Degree of support available:   Unknown    CURRENT CONCERNS Current Concerns  Post-Acute Placement   Other Concerns:    SOCIAL WORK ASSESSMENT / PLAN CSW intern introduced self and acknowledged the patient. Patient is alert and oriented. Patient engaged well in conversation with CSW intern. CSW intern discussed discharge plans. CSW intern confirmed that the patient is from [facility]. Patient informed CSW intern that she will be returning to [facility].   Assessment/plan status:  No Further Intervention Required Other assessment/ plan:   CSW will follow up with facility when patient is medically stable for discharge.   Information/referral to community resources:   None requested or provided at the time    PATIENT'S/FAMILY'S RESPONSE TO PLAN OF CARE: Patient was very appreciative of the services provided by CSW intern.      Fernande Boyden, Social Work Intern 11/02/2012

## 2012-11-02 NOTE — Progress Notes (Signed)
[facility] PROGRESS NOTE  Sara [name] XLK:[id number] DOB: 1918/09/25 DOA: 11/01/2012 PCP: Sara Moh, MD  Assessment/Plan: Altered mental status/Acute delirium/Acute encephalopathy Slurred speech with altered mental status likely related to fever from UTI. Mentation improved. MRI of the brain pending to evaluate for low possibility of CVA.  Low grade fever/UTI Started on ciprofloxacin. Follow urine culture results.  Chronic diastolic CHF (recent acute exacerbation) Patient appears presently compensated. Lasix currently held, restart at evening dose of 40 mg po BID.  Recent history of RLE cellulitis s/p AKA Stump looks clean without any signs of infection.  History of atrial fibrillation Currently in sinus, rate controlled. Not [name] candidate for anticoagulation.  Hyponatremia May be due to diuretics. At baseline, continue to monitor.  Chronic leukocytosis from T-cell lymphoma Management per Dr. Cyndie [name]. Stable.  History of mycosis fungoides and psoriasis Stable.  Anemia Likely due to chronic disease.  Hypertension Stable.  CAD On ASA and plavix.  Code Status: DNR/DNI Family Communication: Daughter at bedside.  Disposition Plan: Pending.   Consultants:  None  Procedures:  As above.  Antibiotics:  Ciprofloxacin 2/192/014 >>  HPI/Subjective: Feeling [name] lot better. Reports being back to baseline. Speech back to baseline.  Objective: Filed Vitals:   11/02/12 0000 11/02/12 0100 11/02/12 0141 11/02/12 0547  BP: 155/81 135/71 164/53 157/65  Pulse: 72 69 68 71  Temp:   97.3 F (36.3 C) 97.6 F (36.4 C)  TempSrc:   Oral Oral  Resp: 16 16 18 18   Height: 5\' 7"  (1.702 m)     Weight: 60.6 kg (133 lb 9.6 oz)     SpO2: 100% 100% 97% 99%    Intake/Output Summary (Last 24 hours) at 11/02/12 0754 Last data filed at 11/02/12 0200  Gross per 24 hour  Intake      0 ml  Output    450 ml  Net   -450 ml   Filed Weights   11/02/12 0000   Weight: 60.6 kg (133 lb 9.6 oz)    Exam: Physical Exam: General: Awake, Oriented, No acute distress. HEENT: EOMI. Neck: Supple CV: S1 and S2 Lungs: Clear to ascultation bilaterally Abdomen: Soft, Nontender, Nondistended, +bowel sounds. Ext: Good pulses. Trace edema. Right stump clean, staples in place.  Data Reviewed: Basic Metabolic Panel:  Recent Labs Lab 11/01/12 2027 11/02/12 0530  NA 129* 131*  K 4.2 4.1  CL 92* 93*  CO2 28 27  GLUCOSE 106* 93  BUN 29* 22  CREATININE 0.63 0.48*  CALCIUM 9.1 9.5   Liver Function Tests:  Recent Labs Lab 11/02/12 0530  AST 13  ALT 8  ALKPHOS 78  BILITOT 0.2*  PROT 5.8*  ALBUMIN 2.6*   No results found for this basename: LIPASE, AMYLASE,  in the last 168 hours No results found for this basename: AMMONIA,  in the last 168 hours CBC:  Recent Labs Lab 11/01/12 2027 11/02/12 0530  WBC 23.0* 25.5*  NEUTROABS 6.7 9.2*  HGB 12.8 11.8*  HCT 37.6 35.1*  MCV 86.4 86.2  PLT 328 323   Cardiac Enzymes: No results found for this basename: CKTOTAL, CKMB, CKMBINDEX, TROPONINI,  in the last 168 hours BNP (last 3 results)  Recent Labs  03/24/12 0400 10/05/12 1545 10/18/12 2010  PROBNP 2150.0* 3200.0* 4481.0*   CBG:  Recent Labs Lab 11/02/12 0641  GLUCAP 107*    No results found for this or any previous visit (from the past 240 hour(s)).   Studies: Ct Head Wo Contrast  11/01/2012  *  RADIOLOGY REPORT*  Clinical Data: With confusion, headache  CT HEAD WITHOUT CONTRAST  Technique:  Contiguous axial images were obtained from the base of the skull through the vertex without contrast.  Comparison: Prior MRI of the brain 10/05/2012; prior head CT 10/05/2012  Findings: No acute intracranial hemorrhage, acute infarction, mass lesion, mass effect, midline shift or hydrocephalus.  Gray-white differentiation is preserved throughout.  Similar pattern of extensive periventricular, subcortical and deep white matter hypoattenuation which  is nonspecific but most consistent with the sequela of longstanding microvascular ischemic disease.  Stable remote lacunar infarcts and the right thalamus and external capsule.  Table hypoattenuation in the left mid brain.  Mild cerebral and cerebellar atrophy.  No acute soft tissue, or calvarial abnormality.  Normal aeration of the mastoid air cells and paranasal sinuses.  IMPRESSION:  1.  No acute intracranial abnormality. 2.  Extensive small vessel chronic ischemic changes and prior right basal ganglia lacunar infarcts appear unchanged.   Original Report Authenticated By: Sara [name], M.D.    Dg Chest Port 1 View  11/01/2012  *RADIOLOGY REPORT*  Clinical Data: Confusion.  Atrial fibrillation.  PORTABLE CHEST - 1 VIEW  Comparison: 10/05/2012.  Findings: The cardiac silhouette, mediastinal and hilar contours are normal and stable.  There is tortuosity and calcification of the thoracic aorta.  The lungs are clear.  Chronic lung changes. Remote healed humeral neck fracture noted on the left.  IMPRESSION: No acute cardiopulmonary findings.  Chronic lung changes.   Original Report Authenticated By: Sara [name], M.D.     Scheduled Meds: . aspirin  81 mg Oral q morning - 10a  . bexarotene  75 mg Oral QAC supper  . carvedilol  12.5 mg Oral BID WC  . clopidogrel  75 mg Oral Daily  . diltiazem  120 mg Oral Daily  . docusate sodium  100 mg Oral Daily  . ferrous sulfate  325 mg Oral BID  . folic acid  1 mg Oral Daily  . heparin  5,000 Units Subcutaneous Q8H  . hydrOXYzine  10 mg Oral QHS  . lisinopril  5 mg Oral Daily  . omega-3 acid ethyl esters  1,000 mg Oral QHS  . polyethylene glycol  17 g Oral Daily  . protein supplement  2 scoop Oral BID  . vitamin C  500 mg Oral QHS   Continuous Infusions: . 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 If 7PM-7AM, please contact night-coverage at [email or web address], password [facility] 11/02/2012, 7:54 AM  LOS: 1 day

## 2012-11-02 NOTE — H&P (Addendum)
[facility] History and Physical  Sara [name] ZOX:[id number] DOB: 12-12-1918 DOA: 11/01/2012  Referring physician: Dr. Effie Shy. PCP: Londell Moh, MD  Specialists: None.  Chief Complaint: Possible CVA. Was referred from the nursing home.  HPI: Sara [name] is a 79 y.o. female with history of CHF who was just recently discharged after being treated for decompensated CHF and cellulitis was brought from the nursing home after patient was found to have him equal pupils and slurred speech. On arrival in the ER patient still had slurred speech as per the nurse but patient also was found to be febrile and confused with UA showing features of UTI. CT head is negative. At this time patient symptoms are related to her fever possibly from UTI. Patient's daughter who is at the bedside states that she's been having fever since yesterday morning. She also was slightly confused and more somnolent. But patient did not have any nausea vomiting diarrhea shortness of breath or cough. On my exam patient was somnolent because patient just received Ativan. Patient will be admitted for further management. Patient was recently admitted for CHF prior to that patient also had right AKA for gangrene and also had CVA workup.  Review of Systems: The patient denies anorexia, fever, weight loss,, vision loss, decreased hearing, hoarseness, chest pain, syncope, dyspnea on exertion, peripheral edema, balance deficits, hemoptysis, abdominal pain, melena, hematochezia, severe indigestion/heartburn, hematuria, incontinence, genital sores, muscle weakness, suspicious skin lesions, transient blindness, difficulty walking, depression, unusual weight change, abnormal bleeding, enlarged lymph nodes, angioedema, and breast masses. Patient had slurred speech.  Past Medical History  Diagnosis Date  . Hypertension   . Atrial fibrillation   . Psoriasis   . Congestive heart failure (CHF)   . History of CVA (cerebrovascular  accident)   . Coronary artery disease   . Myocardial infarction   . CHF (congestive heart failure)   . Shortness of breath   . H/O hiatal hernia   . Closed fracture of head of left humerus 5/12  . Basal cell carcinoma   . 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  Flow cytometry 04/04/12: 91% cells CD4 positive CD 26 negative  . Hyperlipemia   . Pneumonia   . Wound of right leg 09/25/2012    Necrotic, open wound right lower leg; non healing  . Stroke    Past Surgical History  Procedure Laterality Date  . Tonsillectomy    . Dilation and curettage of uterus    . Eye surgery    . Cataracts    . Vertebroplasty    . Amputation  10/10/2012    Procedure: AMPUTATION ABOVE KNEE;  Surgeon: Nadara Mustard, MD;  Location: [facility] OR;  Service: Orthopedics;  Laterality: Right;   Social History:  reports that she has never smoked. She has never used smokeless tobacco. She reports that she does not drink alcohol or use illicit drugs. Levels at nursing home. where does patient live--home, ALF, SNF? and with whom if at home? Cannot do ADLs. Can patient participate in ADLs?  Allergies  Allergen Reactions  . Penicillins Other (See Comments)    unknown  . Triamterene Other (See Comments)    unknown  . Alendronate Sodium Rash    Pt stated oh k to use biotene    Family History  Problem Relation Age of Onset  . Stomach cancer Mother   .  Pneumonia Father       Prior to Admission medications   Medication Sig Start Date End Date Taking? Authorizing Provider  acetaminophen (TYLENOL) 325 MG tablet Take 650 mg by mouth every 4 (four) hours as needed for fever (temperature >/= 99.5 F). 10/11/12  Yes Meredeth Ide, MD  aspirin 81 MG chewable tablet Chew 81 mg by mouth every morning.    Yes Historical Provider, MD  bexarotene (TARGRETIN) 75 MG CAPS capsule Take 75 mg by  mouth daily before supper. Give with food. Protect from light. CAUTION: Chemotherapy/Biotherapy 09/25/12  Yes Historical Provider, MD  carvedilol (COREG) 12.5 MG tablet Take 12.5 mg by mouth 2 (two) times daily with a meal.   Yes Historical Provider, MD  clopidogrel (PLAVIX) 75 MG tablet Take 75 mg by mouth daily.   Yes Historical Provider, MD  Coenzyme Q10 (CO Q 10) 100 MG CAPS Take 1 capsule by mouth daily.   Yes Historical Provider, MD  diltiazem (CARDIZEM CD) 120 MG 24 hr capsule Take 120 mg by mouth daily.   Yes Historical Provider, MD  docusate sodium (COLACE) 100 MG capsule Take 100 mg by mouth daily. For constipation   Yes Historical Provider, MD  ferrous sulfate 325 (65 FE) MG tablet Take 325 mg by mouth 2 (two) times daily.   Yes Historical Provider, MD  folic acid (FOLVITE) 1 MG tablet Take 1 mg by mouth daily.   Yes Historical Provider, MD  furosemide (LASIX) 40 MG tablet Take 1 tablet (40 mg total) by mouth 2 (two) times daily. 10/20/12  Yes Belkys A Regalado, MD  hydrOXYzine (ATARAX/VISTARIL) 10 MG tablet Take 10 mg by mouth at bedtime.    Yes Historical Provider, MD  lisinopril (PRINIVIL,ZESTRIL) 5 MG tablet Take 5 mg by mouth daily.   Yes Historical Provider, MD  nitroGLYCERIN (NITROSTAT) 0.4 MG SL tablet Place 0.4 mg under the tongue every 5 (five) minutes as needed for chest pain.    Yes Historical Provider, MD  NONFORMULARY OR COMPOUNDED ITEM Apply 1 application topically 2 (two) times daily. To flaky or irritated areas (NOT face or groin) triamcinolone 1% cream 1:1 with cetaphil cream   Yes Historical Provider, MD  Omega-3 Fatty Acids (FISH OIL) 1000 MG CAPS Take 1,000 mg by mouth at bedtime.    Yes Historical Provider, MD  oxyCODONE (OXY IR/ROXICODONE) 5 MG immediate release tablet Take 5-10 mg by mouth every 4 (four) hours as needed for pain. Take 1 tablet for moderate pain and 2 tablets for severe pain 10/11/12  Yes Meredeth Ide, MD  polyethylene glycol (MIRALAX / GLYCOLAX) packet  Take 17 g by mouth daily.   Yes Historical Provider, MD  protein supplement (RESOURCE BENEPROTEIN) POWD Take 2 scoop by mouth 2 (two) times daily. In beverage   Yes Historical Provider, MD  vitamin C (ASCORBIC ACID) 500 MG tablet Take 500 mg by mouth at bedtime.    Yes Historical Provider, MD  white petrolatum (VASELINE) GEL Apply 1 application topically 3 (three) times daily. To buttocks/sacrum once per shift   Yes Historical Provider, MD   Physical Exam: Filed Vitals:   11/01/12 2300 11/01/12 2315 11/01/12 2330 11/02/12 0000  BP: 120/51  110/65 155/81  Pulse: 68 71 70 72  Temp:      TempSrc:      Resp: 18 16 16 16   SpO2: 99% 99% 99% 100%     General:  Well-built moderately nourished.  Eyes: Anicteric no pallor.  ENT: No discharge  from ears eyes nose.  Neck: No mass felt.  Cardiovascular: S1-S2 heard.  Respiratory: No rhonchi no crepitations.  Abdomen: Soft nontender.  Skin: Chronic skin changes from psoriasis and possibly mycosis fungoides.  Musculoskeletal: Nontender.  Psychiatric: Presently somnolent.  Neurologic: Presently somnolent arousable but no fussiness exam possible.  Labs on Admission:  Basic Metabolic Panel:  Recent Labs Lab 11/01/12 2027  NA 129*  K 4.2  CL 92*  CO2 28  GLUCOSE 106*  BUN 29*  CREATININE 0.63  CALCIUM 9.1   Liver Function Tests: No results found for this basename: AST, ALT, ALKPHOS, BILITOT, PROT, ALBUMIN,  in the last 168 hours No results found for this basename: LIPASE, AMYLASE,  in the last 168 hours No results found for this basename: AMMONIA,  in the last 168 hours CBC:  Recent Labs Lab 11/01/12 2027  WBC 23.0*  NEUTROABS 6.7  HGB 12.8  HCT 37.6  MCV 86.4  PLT 328   Cardiac Enzymes: No results found for this basename: CKTOTAL, CKMB, CKMBINDEX, TROPONINI,  in the last 168 hours  BNP (last 3 results)  Recent Labs  03/24/12 0400 10/05/12 1545 10/18/12 2010  PROBNP 2150.0* 3200.0* 4481.0*   CBG: No  results found for this basename: GLUCAP,  in the last 168 hours  Radiological Exams on Admission: Ct Head Wo Contrast  11/01/2012  *RADIOLOGY REPORT*  Clinical Data: With confusion, headache  CT HEAD WITHOUT CONTRAST  Technique:  Contiguous axial images were obtained from the base of the skull through the vertex without contrast.  Comparison: Prior MRI of the brain 10/05/2012; prior head CT 10/05/2012  Findings: No acute intracranial hemorrhage, acute infarction, mass lesion, mass effect, midline shift or hydrocephalus.  Gray-white differentiation is preserved throughout.  Similar pattern of extensive periventricular, subcortical and deep white matter hypoattenuation which is nonspecific but most consistent with the sequela of longstanding microvascular ischemic disease.  Stable remote lacunar infarcts and the right thalamus and external capsule.  Table hypoattenuation in the left mid brain.  Mild cerebral and cerebellar atrophy.  No acute soft tissue, or calvarial abnormality.  Normal aeration of the mastoid air cells and paranasal sinuses.  IMPRESSION:  1.  No acute intracranial abnormality. 2.  Extensive small vessel chronic ischemic changes and prior right basal ganglia lacunar infarcts appear unchanged.   Original Report Authenticated By: Malachy Moan, M.D.    Dg Chest Port 1 View  11/01/2012  *RADIOLOGY REPORT*  Clinical Data: Confusion.  Atrial fibrillation.  PORTABLE CHEST - 1 VIEW  Comparison: 10/05/2012.  Findings: The cardiac silhouette, mediastinal and hilar contours are normal and stable.  There is tortuosity and calcification of the thoracic aorta.  The lungs are clear.  Chronic lung changes. Remote healed humeral neck fracture noted on the left.  IMPRESSION: No acute cardiopulmonary findings.  Chronic lung changes.   Original Report Authenticated By: Rudie Meyer, M.D.      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 Fever from UTI - patient has been started on Cipro. Continue for now follow urine cultures. Gently hydrate. 2. Possible TIA versus CVA - at this time I have discussed with neurologist on-call Dr. Roseanne Reno. Patient's symptoms are most likely secondary to her fever. Patient at this time will be placed on neuro checks and check MRI brain. MRI brain is positive then further neurological workup. 3. History  of CHF - presently looks compensated. Closely follow intake output. Presently I have held Lasix. Gently hydrating for 12 hours. Reassess then. 4. History of atrial fibrillation - presently in sinus rhythm. 5. Recent AKA and cellulitis - stump looks clean. 6. Hyponatremia - probably from dehydration. Closely follow metabolic panel and intake output. 7. Chronic leukocytosis from T-cell lymphoma. 8. History of mycosis fungoides and psoriasis.  Discuss with neurologist on-call Dr. Roseanne Reno. if consultant consulted, please document name and whether formally or informally consulted  Code Status: DO NOT RESUSCITATE.  Family Communication: Discussed with patient's daughter who was at the bedside.  Disposition Plan: Admit to inpatient.   Anjelica Gorniak N. [facility] Pager [phone].  If 7PM-7AM, please contact night-coverage [email or web address] Password [facility] 11/02/2012, 12:44 AM

## 2012-11-03 DIAGNOSIS — D72829 Elevated white blood cell count, unspecified: Principal | ICD-10-CM

## 2012-11-03 LAB — URINE CULTURE: Colony Count: >=100000

## 2012-11-03 LAB — CBC
HCT: 34.8 % — ABNORMAL LOW (ref 36.0–46.0)
Hemoglobin: 11.7 g/dL — ABNORMAL LOW (ref 12.0–15.0)
MCHC: 33.6 g/dL (ref 30.0–36.0)
MCV: 86.8 fL (ref 78.0–100.0)

## 2012-11-03 LAB — GLUCOSE, CAPILLARY
Glucose-Capillary: 97 mg/dL (ref 70–99)
Glucose-Capillary: 128 mg/dL — ABNORMAL HIGH (ref 70–99)
Glucose-Capillary: 100 mg/dL — ABNORMAL HIGH (ref 70–99)

## 2012-11-03 LAB — BASIC METABOLIC PANEL
BUN: 19 mg/dL (ref 6–23)
GFR calc non Af Amer: 82 mL/min — ABNORMAL LOW (ref >90)
Glucose, Bld: 95 mg/dL (ref 70–99)
Potassium: 4.1 mEq/L (ref 3.5–5.1)

## 2012-11-03 MED ORDER — ATORVASTATIN CALCIUM 20 MG PO TABS: 20.0000 mg | ORAL_TABLET | Freq: Every day | ORAL | Status: DC

## 2012-11-03 MED ORDER — GABAPENTIN 100 MG PO CAPS
100.0000 mg | ORAL_CAPSULE | Freq: Three times a day (TID) | ORAL | Status: DC
  Administered 2012-11-03 – 2012-11-04 (×4): 100 mg via ORAL
  Filled 2012-11-03 (×6): qty 1

## 2012-11-03 MED ORDER — GABAPENTIN 100 MG PO CAPS: 100.0000 mg | ORAL_CAPSULE | Freq: Three times a day (TID) | ORAL | Status: DC

## 2012-11-03 MED ORDER — CIPROFLOXACIN HCL 250 MG PO TABS: 250.0000 mg | ORAL_TABLET | Freq: Two times a day (BID) | ORAL | Status: AC

## 2012-11-03 MED ORDER — ATORVASTATIN CALCIUM 20 MG PO TABS
20.0000 mg | ORAL_TABLET | Freq: Every day | ORAL | Status: DC
  Administered 2012-11-03: 20 mg via ORAL
  Filled 2012-11-03 (×2): qty 1

## 2012-11-03 NOTE — Discharge Summary (Addendum)
Physician Discharge Summary  Sara [name]:[id number] DOB: [birth year] DOA: 11/01/2012  PCP: Londell Moh, MD  Admit date: 11/01/2012 Discharge date: 11/04/2012  Time spent: 25 minutes  Recommendations for Outpatient Follow-up:  Followup with Londell Moh, MD (PCP) in 1 week.  Please followup with Dr. Lajoyce Corners in 3 weeks.  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 female with history of CHF who was just recently discharged after being treated for decompensated CHF and cellulitis was brought from the nursing home after patient was found to have him equal pupils and slurred speech on 11/01/2012."  Hospital Course:  Altered mental status/Acute delirium/Acute encephalopathy Slurred speech with altered mental status likely related to fever from UTI. Slurred speech and mentation at baseline. MRI of the brain negative for CVA or any acute changes.  Pseudomonas UTI/Low grade fever Continue on ciprofloxacin, culture results show bacteria is pansensitive. Fever resolved. Ciprofloxacin till 11/07/2012 to complete a 7 day course of antibiotics.  Chronic diastolic CHF (recent acute exacerbation) Patient appears presently compensated. Lasix restarted at 40 mg po BID.  Recent history of RLE cellulitis s/p AKA Stump looks clean without any signs of infection. Staples to be removed today.  History of atrial fibrillation Currently in sinus, rate controlled.  Not a candidate for anticoagulation.  Hyponatremia May be due to diuretics. At baseline, continue to monitor.  Chronic leukocytosis from T-cell lymphoma Management per Dr. Cyndie Chime. Stable.  History of mycosis fungoides and psoriasis Stable.  Anemia Likely due to chronic disease.  Hypertension Mildly elevated in the mornings, likely because of antihypertensive medications wearing off. Blood pressure improves after patient starts taking antihypertensive medication during morning med pass. Will make no changes at this time given one reading of elevated blood pressure. Consider spacing out antihypertensive medications during the day, change lisinopril to the evenings.  CAD On ASA and plavix.  Hyperlipidemia Started on statins.  Code Status: DNR/DNI  Consultants:  Dr. Lajoyce Corners, ortho.   Procedures:  As above.  Antibiotics:  Ciprofloxacin 11/01/2012 >> (Till 11/07/2012).  Discharge Exam: Filed Vitals:   11/03/12 2157 11/04/12 0202 11/04/12 0323 11/04/12 0511  BP: 119/48 179/64 160/61 198/61  Pulse: 70 75 67 68  Temp: 97.7 F (36.5 C) 97.7 F (36.5 C)  97.7 F (36.5 C)  TempSrc: Oral Oral  Oral  Resp: 18 20  20   Height:      Weight:      SpO2: 97% 97%  97%   Discharge Instructions      Discharge Orders   Future Orders Complete By Expires     Diet - low sodium heart healthy  As directed     Discharge instructions  As directed     Comments:      Followup with Londell Moh, MD (PCP) in 1 week.  Please followup with Dr. Lajoyce Corners in 3 weeks.    Increase activity slowly  As directed  Medication List    TAKE these medications       acetaminophen 325 MG tablet  Commonly known as:  TYLENOL  Take 650 mg by mouth every 4 (four) hours as needed for fever (temperature >/= 99.5 F).     aspirin 81 MG chewable tablet  Chew 81 mg by mouth every morning.     atorvastatin 20 MG tablet  Commonly known as:  LIPITOR  Take 1 tablet (20 mg total) by mouth  daily at 6 PM.     bexarotene 75 MG Caps capsule  Commonly known as:  TARGRETIN  Take 75 mg by mouth daily before supper. Give with food. Protect from light. CAUTION: Chemotherapy/Biotherapy     carvedilol 12.5 MG tablet  Commonly known as:  COREG  Take 12.5 mg by mouth 2 (two) times daily with a meal.     ciprofloxacin 250 MG tablet  Commonly known as:  CIPRO  Take 1 tablet (250 mg total) by mouth 2 (two) times daily. Till 11/07/2012.     clopidogrel 75 MG tablet  Commonly known as:  PLAVIX  Take 75 mg by mouth daily.     Co Q 10 100 MG Caps  Take 1 capsule by mouth daily.     diltiazem 120 MG 24 hr capsule  Commonly known as:  CARDIZEM CD  Take 120 mg by mouth daily.     docusate sodium 100 MG capsule  Commonly known as:  COLACE  Take 100 mg by mouth daily. For constipation     ferrous sulfate 325 (65 FE) MG tablet  Take 325 mg by mouth 2 (two) times daily.     Fish Oil 1000 MG Caps  Take 1,000 mg by mouth at bedtime.     folic acid 1 MG tablet  Commonly known as:  FOLVITE  Take 1 mg by mouth daily.     furosemide 40 MG tablet  Commonly known as:  LASIX  Take 1 tablet (40 mg total) by mouth 2 (two) times daily.     gabapentin 100 MG capsule  Commonly known as:  NEURONTIN  Take 1 capsule (100 mg total) by mouth 3 (three) times daily.     hydrOXYzine 10 MG tablet  Commonly known as:  ATARAX/VISTARIL  Take 10 mg by mouth at bedtime.     lisinopril 5 MG tablet  Commonly known as:  PRINIVIL,ZESTRIL  Take 1 tablet (5 mg total) by mouth every evening.     nitroGLYCERIN 0.4 MG SL tablet  Commonly known as:  NITROSTAT  Place 0.4 mg under the tongue every 5 (five) minutes as needed for chest pain.     NONFORMULARY OR COMPOUNDED ITEM  Apply 1 application topically 2 (two) times daily. To flaky or irritated areas (NOT face or groin) triamcinolone 1% cream 1:1 with cetaphil cream     oxyCODONE 5 MG immediate release tablet  Commonly known as:  Oxy IR/ROXICODONE   Take 5-10 mg by mouth every 4 (four) hours as needed for pain. Take 1 tablet for moderate pain and 2 tablets for severe pain     polyethylene glycol packet  Commonly known as:  MIRALAX / GLYCOLAX  Take 17 g by mouth daily.     protein supplement Powd  Take 2 scoop by mouth 2 (two) times daily. In beverage     vitamin C 500 MG tablet  Commonly known as:  ASCORBIC ACID  Take 500 mg by mouth at bedtime.     white  petrolatum Gel  Commonly known as:  VASELINE  Apply 1 application topically 3 (three) times daily. To buttocks/sacrum once per shift       Follow-up Information   Follow up with DUDA,MARCUS V, MD In 3 weeks.   Contact information:   [address] [place] [address] [phone]        The results of significant diagnostics from this hospitalization (including imaging, microbiology, ancillary and laboratory) are listed below for reference.    Significant Diagnostic Studies: Dg Chest 1 View  10/05/2012  *RADIOLOGY REPORT*  Clinical Data: Leg ulceration.  CHEST - 1 VIEW  Comparison: PA and lateral chest 08/01/2012 and CT chest 06/02/2012.  Findings: Lungs are clear.  Heart size normal.  No pneumothorax or pleural fluid.  Remote fracture surgical neck left humerus is identified.  The patient is status post lower thoracic vertebroplasty.  IMPRESSION: No acute finding.   Original Report Authenticated By: Holley Dexter, M.D.    Dg Pelvis 1-2 Views  10/05/2012  *RADIOLOGY REPORT*  Clinical Data: Right thigh pain.  PELVIS - 1-2 VIEW  Comparison: Pelvic CT 06/02/2012  Findings: Single view of the pelvis was obtained.  The pelvic bony ring is intact.  There are vascular calcifications.  No gross abnormality to either hip.  Nonspecific bowel gas pattern.  Stool in the rectal region.  IMPRESSION: No acute bony abnormality.   Original Report Authenticated By: Richarda Overlie, M.D.    Dg Femur Right  10/05/2012  *RADIOLOGY REPORT*  Clinical Data: Right thigh pain.  RIGHT FEMUR  - 2 VIEW  Comparison: Pelvis 10/05/2012  Findings: Two views of the right femur were obtained.  No evidence for acute fracture.  The right hip is located.  There are vascular calcifications.  Limited evaluation of the knee joint.  IMPRESSION: No evidence for acute fracture.   Original Report Authenticated By: Richarda Overlie, M.D.    Dg Knee 1-2 Views Right  10/05/2012  *RADIOLOGY REPORT*  Clinical Data: Right thigh pain.  Rule out fracture.  RIGHT KNEE - 1-2 VIEW  Comparison: Right femur 10/05/2012  Findings: Two views of the knee were obtained.  The knee is located without acute fracture.  There are enthesopathic changes involving the patella.  Limited evaluation for joint space narrowing.  IMPRESSION: No acute bony abnormality.   Original Report Authenticated By: Richarda Overlie, M.D.    Dg Tibia/fibula Right  10/05/2012  *RADIOLOGY REPORT*  Clinical Data: Large ulceration over the Achilles tendon.  RIGHT TIBIA AND FIBULA - 2 VIEW  Comparison: Plain films right ankle 12/07/2011 and plain films right knee 01/14/2011.  Findings: Skin ulceration over the Achilles tendon is identified. No underlying soft tissue gas collection, radiopaque foreign body or bony destructive change is noted.  Bones are osteopenic.  There is no fracture.  Degenerative disease of the midfoot is noted.  IMPRESSION: Large skin ulceration over the Achilles without plain film evidence of osteomyelitis.   Original Report Authenticated By: Holley Dexter, M.D.    Dg Ankle Complete Right  10/05/2012  *RADIOLOGY REPORT*  Clinical Data: Large skin ulceration over the Achilles.  RIGHT ANKLE - COMPLETE 3+ VIEW  Comparison: MRI of the right ankle 01/09/2012 and plain films of the right foot 07/23/2012.  Findings: Large skin ulceration over the Achilles is identified. No underlying radiopaque foreign body, soft tissue gas collection or bony destructive change is noted.  Bones appear osteopenic. Midfoot degenerative disease of the right foot is noted.   IMPRESSION: Large skin ulceration without  plain film evidence of osteomyelitis.   Original Report Authenticated By: Holley Dexter, M.D.    Ct Head Wo Contrast  11/01/2012  *RADIOLOGY REPORT*  Clinical Data: With confusion, headache  CT HEAD WITHOUT CONTRAST  Technique:  Contiguous axial images were obtained from the base of the skull through the vertex without contrast.  Comparison: Prior MRI of the brain 10/05/2012; prior head CT 10/05/2012  Findings: No acute intracranial hemorrhage, acute infarction, mass lesion, mass effect, midline shift or hydrocephalus.  Gray-white differentiation is preserved throughout.  Similar pattern of extensive periventricular, subcortical and deep white matter hypoattenuation which is nonspecific but most consistent with the sequela of longstanding microvascular ischemic disease.  Stable remote lacunar infarcts and the right thalamus and external capsule.  Table hypoattenuation in the left mid brain.  Mild cerebral and cerebellar atrophy.  No acute soft tissue, or calvarial abnormality.  Normal aeration of the mastoid air cells and paranasal sinuses.  IMPRESSION:  1.  No acute intracranial abnormality. 2.  Extensive small vessel chronic ischemic changes and prior right basal ganglia lacunar infarcts appear unchanged.   Original Report Authenticated By: Malachy Moan, M.D.    Ct Head Wo Contrast  10/05/2012  *RADIOLOGY REPORT*  Clinical Data: Episode of unresponsiveness for 2-3 minutes, history hypertension, coronary artery disease post MI, CHF  CT HEAD WITHOUT CONTRAST  Technique:  Contiguous axial images were obtained from the base of the skull through the vertex without contrast.  Comparison: 12/07/2011  Findings: Generalized atrophy. Normal ventricular morphology. No midline shift or mass effect. Small vessel chronic ischemic changes of deep cerebral white matter. No intracranial hemorrhage or mass lesion. area of low attenuation identified at left middle cerebral  peduncle could be related to white matter disease though a developing infarct is not excluded. Tiny old right thalamic and right basal ganglia lacunar infarcts. Scattered streak artifacts from skull base at brain stem and cerebellum. Atherosclerotic calcifications of internal carotid arteries at skull base. Visualized paranasal sinuses and mastoid air cells clear. No acute osseous findings.  IMPRESSION: Atrophy with extensive small vessel chronic ischemic changes of deep cerebral white matter. Tiny old right thalamic and right basal ganglia lacunar infarcts. Question white matter disease changes at the left middle cerebral peduncle though cannot completely exclude infarct.   Original Report Authenticated By: Ulyses Southward, M.D.    Ct Angio Chest Pe W/cm &/or Wo Cm  10/18/2012  *RADIOLOGY REPORT*  Clinical Data: Shortness of breath beginning after recent surgery.  CT ANGIOGRAPHY CHEST  Technique:  Multidetector CT imaging of the chest using the standard protocol during bolus administration of intravenous contrast. Multiplanar reconstructed images including MIPs were obtained and reviewed to evaluate the vascular anatomy.  Contrast: OMNIPAQUE IOHEXOL 350 MG/ML SOLN  Comparison: 06/02/2012  Findings: Technically adequate study with good opacification of the central and segmental pulmonary arteries.  No focal filling defects.  No evidence of significant pulmonary embolus.  Normal heart size.  Calcification in the mitral valve annulus and aortic valve.  Calcification in the coronary arteries.  Calcification in the thoracic aorta.  Normal caliber thoracic aorta.  Prominent but nonpathologically enlarged lymph nodes are demonstrated throughout the mediastinum and in both axilla.  This is stable since the previous study in these are likely reactive.  The esophagus is decompressed.  Visualized portions of the upper abdominal organs are unremarkable except for vascular calcifications which are extensive.  There are small  bilateral pleural effusions with mild bilateral basilar atelectasis.  Visualization of the lung parenchyma  is limited due to respiratory motion artifact but there is no specific consolidation or airspace disease noted.  Bronchial wall thickening suggesting chronic bronchitis.  Airways appear patent.  No pneumothorax.  Degenerative changes throughout the thoracic spine. Normal alignment.  Compression of T12 post kyphoplasty.  IMPRESSION: No evidence of significant pulmonary embolus.  Small bilateral pleural effusions with basilar atelectasis.  Extensive vascular calcifications.   Original Report Authenticated By: Burman Nieves, M.D.    Mri Brain Without Contrast  11/02/2012  *RADIOLOGY REPORT*  Clinical Data: Possible TIA  MRI HEAD WITHOUT CONTRAST  Technique:  Multiplanar, multiecho pulse sequences of the brain and surrounding structures were obtained according to standard protocol without intravenous contrast.  Comparison: CT 11/01/2012, MRI 10/05/2012  Findings: Generalized atrophy.  Advanced chronic microvascular ischemic changes are present throughout the cerebral white matter, basal ganglia, and pons.  Mild chronic ischemia in the cerebellum bilaterally.  These findings are unchanged from the prior MRI.  Negative for acute infarct.  Negative for hemorrhage or mass lesion.  Paranasal sinuses are clear.  IMPRESSION: Advanced chronic microvascular ischemic changes and atrophy.  No acute abnormality and no change from the recent MRI.   Original Report Authenticated By: Janeece Riggers, M.D.    Mr Laqueta Jean Wo Contrast  10/05/2012  *RADIOLOGY REPORT*  Clinical Data: Syncope with right leg hemiparesis.  Atrial fibrillation  MRI HEAD WITHOUT AND WITH CONTRAST  Technique:  Multiplanar, multiecho pulse sequences of the brain and surrounding structures were obtained according to standard protocol without and with intravenous contrast  Contrast: 12mL MULTIHANCE GADOBENATE DIMEGLUMINE 529 MG/ML IV SOLN  Comparison: CT  head 10/05/2012  Findings: Negative for acute infarct.  Chronic microvascular ischemic changes have progressed since the prior MRI of 12/08/2011.  There is diffuse chronic ischemia throughout the cerebral white matter.  There is chronic ischemia in the thalami and basal ganglia,  mid brain and pons bilaterally.  CT abnormality in the left mid brain is chronic.  Negative for intracranial hemorrhage.  No mass or edema.  Postcontrast imaging of the brain reveals normal enhancement.  No enhancing mass lesion is present.  Mild mucosal thickening right maxillary sinus.  IMPRESSION: No acute infarct.  Advanced chronic microvascular ischemia, with progression from 12/08/2011.   Original Report Authenticated By: Janeece Riggers, M.D.    Dg Chest Port 1 View  11/01/2012  *RADIOLOGY REPORT*  Clinical Data: Confusion.  Atrial fibrillation.  PORTABLE CHEST - 1 VIEW  Comparison: 10/05/2012.  Findings: The cardiac silhouette, mediastinal and hilar contours are normal and stable.  There is tortuosity and calcification of the thoracic aorta.  The lungs are clear.  Chronic lung changes. Remote healed humeral neck fracture noted on the left.  IMPRESSION: No acute cardiopulmonary findings.  Chronic lung changes.   Original Report Authenticated By: Rudie Meyer, M.D.    Dg Chest Portable 1 View  10/18/2012  *RADIOLOGY REPORT*  Clinical Data: Shortness of breath.  PORTABLE CHEST - 1 VIEW  Comparison: 10/05/2012 chest x-ray.  06/02/2012 chest CT.  Findings: Hazy opacity right infrahilar region may represent overlapping structures.  Subtle right infrahilar infiltrate not entirely excluded.  Central pulmonary vascular prominence.  No pulmonary edema or gross pneumothorax.  Calcified aorta.  Heart size within normal limits.  CT detected tiny noncalcified nodules not appreciated on this present plain film exam.  IMPRESSION: Hazy opacity right infrahilar region may represent overlapping structures.  Subtle right infrahilar infiltrate not  entirely excluded.   Original Report Authenticated By: Lacy Duverney, M.D.  Microbiology: Recent Results (from the past 240 hour(s))  URINE CULTURE     Status: None   Collection Time    11/01/12 10:45 PM      Result Value Range Status   Specimen Description URINE, CATHETERIZED   Final   Special Requests NONE   Final   Culture  Setup Time 11/02/2012 09:33   Final   Colony Count >=100,000 COLONIES/ML   Final   Culture PSEUDOMONAS AERUGINOSA   Final   Report Status 11/03/2012 FINAL   Final   Organism ID, Bacteria PSEUDOMONAS AERUGINOSA   Final     Labs: Basic Metabolic Panel:  Recent Labs Lab 11/01/12 2027 11/02/12 0530 11/02/12 1448 11/03/12 0640  NA 129* 131*  --  132*  K 4.2 4.1  --  4.1  CL 92* 93*  --  94*  CO2 28 27  --  29  GLUCOSE 106* 93  --  95  BUN 29* 22  --  19  CREATININE 0.63 0.48* 0.65 0.50  CALCIUM 9.1 9.5  --  9.0   Liver Function Tests:  Recent Labs Lab 11/02/12 0530  AST 13  ALT 8  ALKPHOS 78  BILITOT 0.2*  PROT 5.8*  ALBUMIN 2.6*   No results found for this basename: LIPASE, AMYLASE,  in the last 168 hours No results found for this basename: AMMONIA,  in the last 168 hours CBC:  Recent Labs Lab 11/01/12 2027 11/02/12 0530 11/02/12 1448 11/03/12 0640  WBC 23.0* 25.5* 23.4* 20.3*  NEUTROABS 6.7 9.2*  --   --   HGB 12.8 11.8* 11.2* 11.7*  HCT 37.6 35.1* 33.2* 34.8*  MCV 86.4 86.2 86.9 86.8  PLT 328 323 301 296   Cardiac Enzymes: No results found for this basename: CKTOTAL, CKMB, CKMBINDEX, TROPONINI,  in the last 168 hours BNP: BNP (last 3 results)  Recent Labs  03/24/12 0400 10/05/12 1545 10/18/12 2010  PROBNP 2150.0* 3200.0* 4481.0*   CBG:  Recent Labs Lab 11/02/12 2101 11/03/12 0656 11/03/12 0815 11/03/12 1152 11/03/12 1620  GLUCAP 114* 97 128* 100* 110*       Signed:  Dvon Jiles A  [facility] 11/04/2012, 9:35 AM

## 2012-11-03 NOTE — Progress Notes (Signed)
Patient ID: Sara [name], female   DOB: [birth year], 79 y.o.   MRN: [id number] Right above-the-knee amputation incision healing well no signs of infection.  We'll have nursing remove staples today.  Patient complains of phantom pain which she states is new. Will order Neurontin 100 mg 3 times a day. I will followup with the patient in the office in 3 weeks.

## 2012-11-03 NOTE — Progress Notes (Signed)
Telemetry monitor was D/cd as ordered.Centralized Cardiac Monitoring Dept. Was notified.

## 2012-11-03 NOTE — Progress Notes (Addendum)
[facility] PROGRESS NOTE  Sara [name] ZOX:[id number] DOB: 04/20/1919 DOA: 11/01/2012 PCP: Londell Moh, MD  Assessment/Plan: Altered mental status/Acute delirium/Acute encephalopathy Slurred speech with altered mental status likely related to fever from UTI. Mentation and speech at baseline. MRI of the brain negative for CVA.   Pseudomonas UTI/Low grade fever Continue on ciprofloxacin, culture results pending. Fever resolved.  Chronic diastolic CHF (recent acute exacerbation) Patient appears presently compensated. Lasix restarted at 40 mg po BID.  Recent history of RLE cellulitis s/p AKA Stump looks clean without any signs of infection. Staples to be removed today.  History of atrial fibrillation Currently in sinus, rate controlled. Not a candidate for anticoagulation.  Hyponatremia May be due to diuretics. At baseline, continue to monitor.  Chronic leukocytosis from T-cell lymphoma Management per Dr. Cyndie Chime. Stable.  History of mycosis fungoides and psoriasis Stable.  Anemia Likely due to chronic disease.  Hypertension Stable.  CAD On ASA and plavix.  Code Status: DNR/DNI Family Communication: Daughter at bedside.  Disposition Plan: Pending.  Consultants:  Dr. Lajoyce Corners, ortho.   Procedures:  As above.  Antibiotics:  Ciprofloxacin 11/01/2012 >>  HPI/Subjective: No specific concerns, feeling well. Denies any shortness of breath.  Objective: Filed Vitals:   11/02/12 1922 11/02/12 2058 11/03/12 0113 11/03/12 0515  BP: 109/42 114/49 125/61 175/65  Pulse: 83 72 73 72  Temp: 98.2 F (36.8 C) 98.1 F (36.7 C) 97.6 F (36.4 C) 97.2 F (36.2 C)  TempSrc: Oral Oral Oral Oral  Resp: 18 18 18 20   Height:      Weight:      SpO2: 97% 99% 99% 99%   No intake or output data in the 24 hours ending 11/03/12 0916 Filed Weights   11/02/12 0000  Weight: 60.6 kg (133 lb 9.6 oz)    Exam: Physical Exam: General: Awake, Oriented, No acute  distress. HEENT: EOMI. Neck: Supple CV: S1 and S2 Lungs: Clear to ascultation bilaterally Abdomen: Soft, Nontender, Nondistended, +bowel sounds. Ext: Good pulses. Trace edema. Right stump clean.  Data Reviewed: Basic Metabolic Panel:  Recent Labs Lab 11/01/12 2027 11/02/12 0530 11/02/12 1448 11/03/12 0640  NA 129* 131*  --  132*  K 4.2 4.1  --  4.1  CL 92* 93*  --  94*  CO2 28 27  --  29  GLUCOSE 106* 93  --  95  BUN 29* 22  --  19  CREATININE 0.63 0.48* 0.65 0.50  CALCIUM 9.1 9.5  --  9.0   Liver Function Tests:  Recent Labs Lab 11/02/12 0530  AST 13  ALT 8  ALKPHOS 78  BILITOT 0.2*  PROT 5.8*  ALBUMIN 2.6*   No results found for this basename: LIPASE, AMYLASE,  in the last 168 hours No results found for this basename: AMMONIA,  in the last 168 hours CBC:  Recent Labs Lab 11/01/12 2027 11/02/12 0530 11/02/12 1448 11/03/12 0640  WBC 23.0* 25.5* 23.4* 20.3*  NEUTROABS 6.7 9.2*  --   --   HGB 12.8 11.8* 11.2* 11.7*  HCT 37.6 35.1* 33.2* 34.8*  MCV 86.4 86.2 86.9 86.8  PLT 328 323 301 296   Cardiac Enzymes: No results found for this basename: CKTOTAL, CKMB, CKMBINDEX, TROPONINI,  in the last 168 hours BNP (last 3 results)  Recent Labs  03/24/12 0400 10/05/12 1545 10/18/12 2010  PROBNP 2150.0* 3200.0* 4481.0*   CBG:  Recent Labs Lab 11/02/12 1158 11/02/12 1642 11/02/12 2101 11/03/12 0656 11/03/12 0815  GLUCAP 118* 109* 114*  97 128*    Recent Results (from the past 240 hour(s))  URINE CULTURE     Status: None   Collection Time    11/01/12 10:45 PM      Result Value Range Status   Specimen Description URINE, CATHETERIZED   Final   Special Requests NONE   Final   Culture  Setup Time 11/02/2012 09:33   Final   Colony Count >=100,000 COLONIES/ML   Final   Culture PSEUDOMONAS AERUGINOSA   Final   Report Status PENDING   Incomplete     Studies: Ct Head Wo Contrast  11/01/2012  *RADIOLOGY REPORT*  Clinical Data: With confusion, headache   CT HEAD WITHOUT CONTRAST  Technique:  Contiguous axial images were obtained from the base of the skull through the vertex without contrast.  Comparison: Prior MRI of the brain 10/05/2012; prior head CT 10/05/2012  Findings: No acute intracranial hemorrhage, acute infarction, mass lesion, mass effect, midline shift or hydrocephalus.  Gray-white differentiation is preserved throughout.  Similar pattern of extensive periventricular, subcortical and deep white matter hypoattenuation which is nonspecific but most consistent with the sequela of longstanding microvascular ischemic disease.  Stable remote lacunar infarcts and the right thalamus and external capsule.  Table hypoattenuation in the left mid brain.  Mild cerebral and cerebellar atrophy.  No acute soft tissue, or calvarial abnormality.  Normal aeration of the mastoid air cells and paranasal sinuses.  IMPRESSION:  1.  No acute intracranial abnormality. 2.  Extensive small vessel chronic ischemic changes and prior right basal ganglia lacunar infarcts appear unchanged.   Original Report Authenticated By: Malachy Moan, M.D.    Mri Brain Without Contrast  11/02/2012  *RADIOLOGY REPORT*  Clinical Data: Possible TIA  MRI HEAD WITHOUT CONTRAST  Technique:  Multiplanar, multiecho pulse sequences of the brain and surrounding structures were obtained according to standard protocol without intravenous contrast.  Comparison: CT 11/01/2012, MRI 10/05/2012  Findings: Generalized atrophy.  Advanced chronic microvascular ischemic changes are present throughout the cerebral white matter, basal ganglia, and pons.  Mild chronic ischemia in the cerebellum bilaterally.  These findings are unchanged from the prior MRI.  Negative for acute infarct.  Negative for hemorrhage or mass lesion.  Paranasal sinuses are clear.  IMPRESSION: Advanced chronic microvascular ischemic changes and atrophy.  No acute abnormality and no change from the recent MRI.   Original Report Authenticated  By: Janeece Riggers, M.D.    Dg Chest Port 1 View  11/01/2012  *RADIOLOGY REPORT*  Clinical Data: Confusion.  Atrial fibrillation.  PORTABLE CHEST - 1 VIEW  Comparison: 10/05/2012.  Findings: The cardiac silhouette, mediastinal and hilar contours are normal and stable.  There is tortuosity and calcification of the thoracic aorta.  The lungs are clear.  Chronic lung changes. Remote healed humeral neck fracture noted on the left.  IMPRESSION: No acute cardiopulmonary findings.  Chronic lung changes.   Original Report Authenticated By: Rudie Meyer, M.D.     Scheduled Meds: . aspirin  81 mg Oral q morning - 10a  . bexarotene  75 mg Oral QAC supper  . carvedilol  12.5 mg Oral BID WC  . ciprofloxacin  250 mg Oral BID  . clopidogrel  75 mg Oral Daily  . diltiazem  120 mg Oral Daily  . docusate sodium  100 mg Oral Daily  . ferrous sulfate  325 mg Oral BID  . folic acid  1 mg Oral Daily  . furosemide  40 mg Oral BID  . gabapentin  100  mg Oral TID  . heparin  5,000 Units Subcutaneous Q8H  . hydrOXYzine  10 mg Oral QHS  . lisinopril  5 mg Oral Daily  . omega-3 acid ethyl esters  1,000 mg Oral QHS  . polyethylene glycol  17 g Oral Daily  . protein supplement  2 scoop Oral BID  . vitamin C  500 mg Oral QHS   Continuous Infusions:    Active Problems:   Leukocytosis   Paroxysmal atrial fibrillation, NSR on adm 03/20/12, poor Coumadin candidate secondary to falls   Chronic diastolic HF (heart failure)   Anemia   PVD, Lt CA stent 04/02/11   Mycosis fungoides involving lymph nodes of axilla and upper limb   HTN (hypertension)   Fever   UTI (lower urinary tract infection)    Keland Peyton A, MD  [facility] Pager [phone]. If 7PM-7AM, please contact night-coverage at [email or web address], password [facility] 11/03/2012, 9:16 AM  LOS: 2 days

## 2012-11-03 NOTE — Clinical Social Work Note (Signed)
Clinical Social Work   CSW met with pt and daughter to address discharge plan. Pt will likely discharge tomorrow to [facility]. Facility has received discharge summary and will be able to admit pt. Pt and daughter are agreeable to discharge plan. Weekend CSW will facilitate discharge to [facility] on Saturday, 11/04/2012.   Dede Query, MSW, Theresia Majors [phone]

## 2012-11-03 NOTE — Progress Notes (Signed)
Staples was removed as ordered. Pain medicine given prior to removal of staples. Pt. Tolerated procedure well. Incision area approximated with no drainage or signs of infection. RN will continue to monitor.

## 2012-11-04 MED ORDER — LISINOPRIL 5 MG PO TABS
5.0000 mg | ORAL_TABLET | Freq: Every evening | ORAL | Status: DC
  Filled 2012-11-04: qty 1

## 2012-11-04 MED ORDER — LISINOPRIL 5 MG PO TABS: 5.0000 mg | ORAL_TABLET | Freq: Every evening | ORAL | Status: DC

## 2012-11-04 NOTE — Clinical Social Work Note (Signed)
Pt for transfer to [facility] today via [facility]. Pt, pt's dtr (at bedside), and SNF aware of d/c.  D/C packet complete with signed fl2, signed DNR, and chart copy. CSW signing off at d/c as no other CSW needs identified.  Dellie Burns, MSW, LCSWA [phone] (Weekends 8:00am-4:30pm)

## 2012-11-04 NOTE — Progress Notes (Signed)
Pt stable and ready for discharge. Pt education and teaching complete. IV removed and awaiting PTAR for transport. Report called to nurse at [facility].

## 2012-11-04 NOTE — Progress Notes (Signed)
[facility] PROGRESS NOTE  Sara [name] JXB:[id number] DOB: Nov 09, 1918 DOA: 11/01/2012 PCP: Londell Moh, MD  Assessment/Plan: Altered mental status/Acute delirium/Acute encephalopathy Slurred speech with altered mental status likely related to fever from UTI. Mentation and speech at baseline. MRI of the brain negative for CVA.   Pseudomonas UTI/Low grade fever Continue on ciprofloxacin, culture show pseudomonas is pansensitive. Fever resolved.  Chronic diastolic CHF (recent acute exacerbation) Patient appears presently compensated. Lasix restarted at 40 mg po BID.  Recent history of RLE cellulitis s/p AKA Stump looks clean without any signs of infection. Staples to be removed today.  History of atrial fibrillation Currently in sinus, rate controlled. Not [name] candidate for anticoagulation.  Hyponatremia May be due to diuretics. At baseline, continue to monitor.  Chronic leukocytosis from T-cell lymphoma Management per Dr. Cyndie Chime. Stable.  History of mycosis fungoides and psoriasis Stable.  Anemia Likely due to chronic disease.  Hypertension Mildly elevated this AM, likely because of antihypertensive medications wearing off. Blood pressure will improve after patient starts taking antihypertensive medication. Will make no changes at this time given one reading of elevated blood pressure.   Hyperlipidemia Started on statin.  CAD On ASA and plavix.  Code Status: DNR/DNI Family Communication: Daughter at bedside.  Disposition Plan: Pending.  Consultants:  Dr. Lajoyce Corners, ortho.   Procedures:  As above.  Antibiotics:  Ciprofloxacin 11/01/2012 >>  HPI/Subjective: No specific concerns, feeling well. Denies any shortness of breath.  Objective: Filed Vitals:   11/03/12 2157 11/04/12 0202 11/04/12 0323 11/04/12 0511  BP: 119/48 179/64 160/61 198/61  Pulse: 70 75 67 68  Temp: 97.7 F (36.5 C) 97.7 F (36.5 C)  97.7 F (36.5 C)  TempSrc: Oral Oral   Oral  Resp: 18 20  20   Height:      Weight:      SpO2: 97% 97%  97%    Intake/Output Summary (Last 24 hours) at 11/04/12 0923 Last data filed at 11/04/12 1308  Gross per 24 hour  Intake    360 ml  Output      0 ml  Net    360 ml   Filed Weights   11/02/12 0000  Weight: 60.6 kg (133 lb 9.6 oz)    Exam: Physical Exam: General: Awake, Oriented, No acute distress. HEENT: EOMI. Neck: Supple CV: S1 and S2 Lungs: Clear to ascultation bilaterally Abdomen: Soft, Nontender, Nondistended, +bowel sounds. Ext: Good pulses. Trace edema. Right stump clean.  Data Reviewed: Basic Metabolic Panel:  Recent Labs Lab 11/01/12 2027 11/02/12 0530 11/02/12 1448 11/03/12 0640  NA 129* 131*  --  132*  K 4.2 4.1  --  4.1  CL 92* 93*  --  94*  CO2 28 27  --  29  GLUCOSE 106* 93  --  95  BUN 29* 22  --  19  CREATININE 0.63 0.48* 0.65 0.50  CALCIUM 9.1 9.5  --  9.0   Liver Function Tests:  Recent Labs Lab 11/02/12 0530  AST 13  ALT 8  ALKPHOS 78  BILITOT 0.2*  PROT 5.8*  ALBUMIN 2.6*   No results found for this basename: LIPASE, AMYLASE,  in the last 168 hours No results found for this basename: AMMONIA,  in the last 168 hours CBC:  Recent Labs Lab 11/01/12 2027 11/02/12 0530 11/02/12 1448 11/03/12 0640  WBC 23.0* 25.5* 23.4* 20.3*  NEUTROABS 6.7 9.2*  --   --   HGB 12.8 11.8* 11.2* 11.7*  HCT 37.6 35.1* 33.2* 34.8*  MCV 86.4 86.2 86.9 86.8  PLT 328 323 301 296   Cardiac Enzymes: No results found for this basename: CKTOTAL, CKMB, CKMBINDEX, TROPONINI,  in the last 168 hours BNP (last 3 results)  Recent Labs  03/24/12 0400 10/05/12 1545 10/18/12 2010  PROBNP 2150.0* 3200.0* 4481.0*   CBG:  Recent Labs Lab 11/02/12 2101 11/03/12 0656 11/03/12 0815 11/03/12 1152 11/03/12 1620  GLUCAP 114* 97 128* 100* 110*    Recent Results (from the past 240 hour(s))  URINE CULTURE     Status: None   Collection Time    11/01/12 10:45 PM      Result Value Range  Status   Specimen Description URINE, CATHETERIZED   Final   Special Requests NONE   Final   Culture  Setup Time 11/02/2012 09:33   Final   Colony Count >=100,000 COLONIES/ML   Final   Culture PSEUDOMONAS AERUGINOSA   Final   Report Status 11/03/2012 FINAL   Final   Organism ID, Bacteria PSEUDOMONAS AERUGINOSA   Final     Studies: Mri Brain Without Contrast  11/02/2012  *RADIOLOGY REPORT*  Clinical Data: Possible TIA  MRI HEAD WITHOUT CONTRAST  Technique:  Multiplanar, multiecho pulse sequences of the brain and surrounding structures were obtained according to standard protocol without intravenous contrast.  Comparison: CT 11/01/2012, MRI 10/05/2012  Findings: Generalized atrophy.  Advanced chronic microvascular ischemic changes are present throughout the cerebral white matter, basal ganglia, and pons.  Mild chronic ischemia in the cerebellum bilaterally.  These findings are unchanged from the prior MRI.  Negative for acute infarct.  Negative for hemorrhage or mass lesion.  Paranasal sinuses are clear.  IMPRESSION: Advanced chronic microvascular ischemic changes and atrophy.  No acute abnormality and no change from the recent MRI.   Original Report Authenticated By: Janeece Riggers, M.D.     Scheduled Meds: . aspirin  81 mg Oral q morning - 10a  . atorvastatin  20 mg Oral q1800  . bexarotene  75 mg Oral QAC supper  . carvedilol  12.5 mg Oral BID WC  . ciprofloxacin  250 mg Oral BID  . clopidogrel  75 mg Oral Daily  . diltiazem  120 mg Oral Daily  . docusate sodium  100 mg Oral Daily  . ferrous sulfate  325 mg Oral BID  . folic acid  1 mg Oral Daily  . furosemide  40 mg Oral BID  . gabapentin  100 mg Oral TID  . heparin  5,000 Units Subcutaneous Q8H  . hydrOXYzine  10 mg Oral QHS  . lisinopril  5 mg Oral Daily  . omega-3 acid ethyl esters  1,000 mg Oral QHS  . polyethylene glycol  17 g Oral Daily  . protein supplement  2 scoop Oral BID  . vitamin C  500 mg Oral QHS   Continuous  Infusions:    Active Problems:   Leukocytosis   Paroxysmal atrial fibrillation, NSR on adm 03/20/12, poor Coumadin candidate secondary to falls   Chronic diastolic HF (heart failure)   Anemia   PVD, Lt CA stent 04/02/11   Mycosis fungoides involving lymph nodes of axilla and upper limb   HTN (hypertension)   Fever   UTI (lower urinary tract infection)    Sara Francois A, MD  [facility] Pager [phone]. If 7PM-7AM, please contact night-coverage at [email or web address], password [email or web address] 11/04/2012, 9:23 AM  LOS: 3 days

## 2012-11-21 ENCOUNTER — Telehealth: Payer: Self-pay | Admitting: *Deleted

## 2012-11-21 NOTE — Telephone Encounter (Signed)
Received call from Trudie Reed DOA/[facility] stating that pt needs f/u appt & she has questions about her targretin & needs to know if she is to continue this drug.  She can be reached at [phone].  Message to Dr Cyndie Chime.

## 2012-11-23 ENCOUNTER — Telehealth: Payer: Self-pay | Admitting: *Deleted

## 2012-11-23 NOTE — Telephone Encounter (Signed)
lm appt d/t was given for 12/19/2012 per JG.

## 2012-11-29 ENCOUNTER — Telehealth: Payer: Self-pay | Admitting: *Deleted

## 2012-11-29 NOTE — Telephone Encounter (Signed)
Notified Trudie Reed at [facility] @ [phone] of appt on 12/19/12 at 4pm.

## 2012-12-03 ENCOUNTER — Observation Stay (HOSPITAL_COMMUNITY): Payer: Medicare Other

## 2012-12-03 ENCOUNTER — Encounter (HOSPITAL_COMMUNITY): Payer: Self-pay | Admitting: Emergency Medicine

## 2012-12-03 ENCOUNTER — Observation Stay (HOSPITAL_COMMUNITY)
Admission: EM | Admit: 2012-12-03 | Discharge: 2012-12-05 | Disposition: A | Discharge status: Home / Self Care | Payer: Medicare Other | Attending: Family Medicine | Admitting: Family Medicine

## 2012-12-03 ENCOUNTER — Emergency Department (HOSPITAL_COMMUNITY): Payer: Medicare Other

## 2012-12-03 DIAGNOSIS — J9600 Acute respiratory failure, unspecified whether with hypoxia or hypercapnia: Secondary | ICD-10-CM

## 2012-12-03 DIAGNOSIS — I10 Essential (primary) hypertension: Secondary | ICD-10-CM | POA: Insufficient documentation

## 2012-12-03 DIAGNOSIS — Z66 Do not resuscitate: Secondary | ICD-10-CM | POA: Insufficient documentation

## 2012-12-03 DIAGNOSIS — Z9181 History of falling: Secondary | ICD-10-CM | POA: Insufficient documentation

## 2012-12-03 DIAGNOSIS — I6350 Cerebral infarction due to unspecified occlusion or stenosis of unspecified cerebral artery: Secondary | ICD-10-CM

## 2012-12-03 DIAGNOSIS — I639 Cerebral infarction, unspecified: Secondary | ICD-10-CM | POA: Diagnosis present

## 2012-12-03 DIAGNOSIS — S78119A Complete traumatic amputation at level between unspecified hip and knee, initial encounter: Secondary | ICD-10-CM | POA: Insufficient documentation

## 2012-12-03 DIAGNOSIS — W19XXXA Unspecified fall, initial encounter: Secondary | ICD-10-CM | POA: Insufficient documentation

## 2012-12-03 DIAGNOSIS — I6340 Cerebral infarction due to embolism of unspecified cerebral artery: Principal | ICD-10-CM | POA: Insufficient documentation

## 2012-12-03 DIAGNOSIS — Z8673 Personal history of transient ischemic attack (TIA), and cerebral infarction without residual deficits: Secondary | ICD-10-CM | POA: Insufficient documentation

## 2012-12-03 DIAGNOSIS — R21 Rash and other nonspecific skin eruption: Secondary | ICD-10-CM | POA: Insufficient documentation

## 2012-12-03 DIAGNOSIS — I2510 Atherosclerotic heart disease of native coronary artery without angina pectoris: Secondary | ICD-10-CM | POA: Insufficient documentation

## 2012-12-03 DIAGNOSIS — D72829 Elevated white blood cell count, unspecified: Secondary | ICD-10-CM | POA: Insufficient documentation

## 2012-12-03 DIAGNOSIS — R4701 Aphasia: Secondary | ICD-10-CM | POA: Insufficient documentation

## 2012-12-03 DIAGNOSIS — C8409 Mycosis fungoides, extranodal and solid organ sites: Secondary | ICD-10-CM | POA: Insufficient documentation

## 2012-12-03 DIAGNOSIS — I480 Paroxysmal atrial fibrillation: Secondary | ICD-10-CM

## 2012-12-03 DIAGNOSIS — I5033 Acute on chronic diastolic (congestive) heart failure: Secondary | ICD-10-CM

## 2012-12-03 DIAGNOSIS — I252 Old myocardial infarction: Secondary | ICD-10-CM | POA: Insufficient documentation

## 2012-12-03 DIAGNOSIS — I509 Heart failure, unspecified: Secondary | ICD-10-CM | POA: Insufficient documentation

## 2012-12-03 DIAGNOSIS — D649 Anemia, unspecified: Secondary | ICD-10-CM | POA: Insufficient documentation

## 2012-12-03 DIAGNOSIS — I4891 Unspecified atrial fibrillation: Secondary | ICD-10-CM | POA: Insufficient documentation

## 2012-12-03 DIAGNOSIS — Y92009 Unspecified place in unspecified non-institutional (private) residence as the place of occurrence of the external cause: Secondary | ICD-10-CM | POA: Insufficient documentation

## 2012-12-03 DIAGNOSIS — I5032 Chronic diastolic (congestive) heart failure: Secondary | ICD-10-CM | POA: Diagnosis present

## 2012-12-03 DIAGNOSIS — IMO0002 Reserved for concepts with insufficient information to code with codable children: Secondary | ICD-10-CM | POA: Insufficient documentation

## 2012-12-03 DIAGNOSIS — R29810 Facial weakness: Secondary | ICD-10-CM | POA: Insufficient documentation

## 2012-12-03 LAB — CBC
HCT: 37.3 % (ref 36.0–46.0)
MCV: 87.4 fL (ref 78.0–100.0)
Platelets: 290 10*3/uL (ref 150–400)
RBC: 4.27 MIL/uL (ref 3.87–5.11)
WBC: 26.9 10*3/uL — ABNORMAL HIGH (ref 4.0–10.5)

## 2012-12-03 LAB — URINALYSIS, ROUTINE W REFLEX MICROSCOPIC
Bilirubin Urine: NEGATIVE
Glucose, UA: NEGATIVE mg/dL
Hgb urine dipstick: NEGATIVE
Ketones, ur: NEGATIVE mg/dL
Protein, ur: NEGATIVE mg/dL
pH: 6.5 (ref 5.0–8.0)

## 2012-12-03 LAB — COMPREHENSIVE METABOLIC PANEL
ALT: 11 U/L (ref 0–35)
Calcium: 9.2 mg/dL (ref 8.4–10.5)
Creatinine, Ser: 0.58 mg/dL (ref 0.50–1.10)
GFR calc Af Amer: >90 mL/min (ref >90)
Glucose, Bld: 94 mg/dL (ref 70–99)
Sodium: 137 mEq/L (ref 135–145)
Total Protein: 6.4 g/dL (ref 6.0–8.3)

## 2012-12-03 LAB — POCT I-STAT, CHEM 8
BUN: 26 mg/dL — ABNORMAL HIGH (ref 6–23)
Calcium, Ion: 1.12 mmol/L — ABNORMAL LOW (ref 1.13–1.30)
Chloride: 99 mEq/L (ref 96–112)
Creatinine, Ser: 0.7 mg/dL (ref 0.50–1.10)
TCO2: 32 mmol/L (ref 0–100)

## 2012-12-03 LAB — DIFFERENTIAL
Band Neutrophils: 0 % (ref 0–10)
Eosinophils Absolute: 1.1 10*3/uL — ABNORMAL HIGH (ref 0.0–0.7)
Eosinophils Relative: 4 % (ref 0–5)
Lymphs Abs: 16.7 10*3/uL — ABNORMAL HIGH (ref 0.7–4.0)
Metamyelocytes Relative: 0 %
Monocytes Absolute: 1.3 10*3/uL — ABNORMAL HIGH (ref 0.1–1.0)
Monocytes Relative: 5 % (ref 3–12)
nRBC: 0 /100 WBC

## 2012-12-03 LAB — RAPID URINE DRUG SCREEN, HOSP PERFORMED
Amphetamines: NOT DETECTED
Benzodiazepines: NOT DETECTED
Cocaine: NOT DETECTED
Opiates: NOT DETECTED

## 2012-12-03 LAB — TROPONIN I: Troponin I: <0.3 ng/mL (ref <0.30)

## 2012-12-03 MED ORDER — HYDROXYZINE HCL 10 MG PO TABS
10.0000 mg | ORAL_TABLET | Freq: Every day | ORAL | Status: DC
  Administered 2012-12-04: 10 mg via ORAL
  Filled 2012-12-03 (×3): qty 1

## 2012-12-03 MED ORDER — NITROGLYCERIN 0.4 MG SL SUBL: 0.4000 mg | SUBLINGUAL_TABLET | SUBLINGUAL | Status: DC | PRN

## 2012-12-03 MED ORDER — SODIUM CHLORIDE 0.9 % IV SOLN
INTRAVENOUS | Status: DC
  Administered 2012-12-03: 19:00:00 via INTRAVENOUS

## 2012-12-03 MED ORDER — ENOXAPARIN SODIUM 40 MG/0.4ML ~~LOC~~ SOLN
40.0000 mg | SUBCUTANEOUS | Status: DC
  Filled 2012-12-03 (×3): qty 0.4

## 2012-12-03 MED ORDER — ALBUTEROL SULFATE (5 MG/ML) 0.5% IN NEBU: 2.5000 mg | INHALATION_SOLUTION | RESPIRATORY_TRACT | Status: DC | PRN

## 2012-12-03 MED ORDER — MUPIROCIN 2 % EX OINT
1.0000 "application " | TOPICAL_OINTMENT | Freq: Two times a day (BID) | CUTANEOUS | Status: DC
  Administered 2012-12-04 – 2012-12-05 (×4): 1 via NASAL
  Filled 2012-12-03: qty 22

## 2012-12-03 MED ORDER — ACETAMINOPHEN 325 MG PO TABS: 650.0000 mg | ORAL_TABLET | ORAL | Status: DC | PRN

## 2012-12-03 MED ORDER — BENEPROTEIN PO POWD
2.0000 | Freq: Two times a day (BID) | ORAL | Status: DC
  Administered 2012-12-04 – 2012-12-05 (×2): 12 g via ORAL
  Filled 2012-12-03: qty 227

## 2012-12-03 MED ORDER — NAPROXEN 500 MG PO TABS
500.0000 mg | ORAL_TABLET | Freq: Two times a day (BID) | ORAL | Status: DC
  Administered 2012-12-04 – 2012-12-05 (×2): 500 mg via ORAL
  Filled 2012-12-03 (×5): qty 1

## 2012-12-03 MED ORDER — FOLIC ACID 1 MG PO TABS
1.0000 mg | ORAL_TABLET | Freq: Every day | ORAL | Status: DC
  Administered 2012-12-04 – 2012-12-05 (×2): 1 mg via ORAL
  Filled 2012-12-03 (×2): qty 1

## 2012-12-03 MED ORDER — OMEGA-3-ACID ETHYL ESTERS 1 G PO CAPS
1.0000 g | ORAL_CAPSULE | Freq: Every day | ORAL | Status: DC
  Administered 2012-12-04: 1 g via ORAL
  Filled 2012-12-03 (×3): qty 1

## 2012-12-03 MED ORDER — CLOPIDOGREL BISULFATE 75 MG PO TABS
75.0000 mg | ORAL_TABLET | Freq: Every day | ORAL | Status: DC
  Administered 2012-12-04 – 2012-12-05 (×2): 75 mg via ORAL
  Filled 2012-12-03 (×2): qty 1

## 2012-12-03 MED ORDER — FUROSEMIDE 40 MG PO TABS
40.0000 mg | ORAL_TABLET | Freq: Two times a day (BID) | ORAL | Status: DC
  Administered 2012-12-04 – 2012-12-05 (×3): 40 mg via ORAL
  Filled 2012-12-03 (×5): qty 1

## 2012-12-03 MED ORDER — DILTIAZEM HCL ER COATED BEADS 120 MG PO CP24
120.0000 mg | ORAL_CAPSULE | Freq: Every day | ORAL | Status: DC
  Administered 2012-12-04 – 2012-12-05 (×3): 120 mg via ORAL
  Filled 2012-12-03 (×3): qty 1

## 2012-12-03 MED ORDER — ACETAMINOPHEN 650 MG RE SUPP: 650.0000 mg | RECTAL | Status: DC | PRN

## 2012-12-03 MED ORDER — FERROUS SULFATE 325 (65 FE) MG PO TABS
325.0000 mg | ORAL_TABLET | Freq: Two times a day (BID) | ORAL | Status: DC
  Administered 2012-12-04 – 2012-12-05 (×3): 325 mg via ORAL
  Filled 2012-12-03 (×5): qty 1

## 2012-12-03 MED ORDER — CARVEDILOL 12.5 MG PO TABS
12.5000 mg | ORAL_TABLET | Freq: Two times a day (BID) | ORAL | Status: DC
  Filled 2012-12-03 (×4): qty 1

## 2012-12-03 MED ORDER — CHLORHEXIDINE GLUCONATE CLOTH 2 % EX PADS
6.0000 | MEDICATED_PAD | Freq: Every day | CUTANEOUS | Status: DC
  Administered 2012-12-05: 6 via TOPICAL

## 2012-12-03 MED ORDER — POLYETHYLENE GLYCOL 3350 17 G PO PACK
17.0000 g | PACK | Freq: Every day | ORAL | Status: DC
  Administered 2012-12-04 – 2012-12-05 (×2): 17 g via ORAL
  Filled 2012-12-03 (×3): qty 1

## 2012-12-03 MED ORDER — NAPROXEN SODIUM 275 MG PO TABS
440.0000 mg | ORAL_TABLET | Freq: Two times a day (BID) | ORAL | Status: DC
  Filled 2012-12-03 (×2): qty 2

## 2012-12-03 MED ORDER — DOCUSATE SODIUM 100 MG PO CAPS
100.0000 mg | ORAL_CAPSULE | Freq: Every day | ORAL | Status: DC
  Administered 2012-12-04 – 2012-12-05 (×2): 100 mg via ORAL
  Filled 2012-12-03: qty 1

## 2012-12-03 MED ORDER — ASPIRIN 81 MG PO CHEW
81.0000 mg | CHEWABLE_TABLET | Freq: Every morning | ORAL | Status: DC
  Administered 2012-12-04 – 2012-12-05 (×2): 81 mg via ORAL
  Filled 2012-12-03 (×2): qty 1

## 2012-12-03 MED ORDER — ATORVASTATIN CALCIUM 20 MG PO TABS
20.0000 mg | ORAL_TABLET | Freq: Every day | ORAL | Status: DC
  Filled 2012-12-03 (×2): qty 1

## 2012-12-03 MED ORDER — BEXAROTENE 75 MG PO CAPS
75.0000 mg | ORAL_CAPSULE | Freq: Every day | ORAL | Status: DC
  Administered 2012-12-04: 75 mg via ORAL
  Filled 2012-12-03: qty 1

## 2012-12-03 NOTE — ED Notes (Signed)
Hyacinth Meeker, MD notified re: NIH score & daughter reporting worsening neuro status, Hyacinth Meeker, MD to update neurologist

## 2012-12-03 NOTE — H&P (Signed)
[facility] History and Physical  Sara [name] ZOX:[id number] DOB: [birth year] DOA: 12/03/2012  Referring physician: Dr. Devota Pace PCP: Londell Moh, MD  OP Specialists:  1. Orthopedics: Dr. Aldean Baker 2. Hematology/Oncology: Dr. Cephas Darby   Chief Complaint: Speech difficulties  HPI: Sara [name] is a 79 y.o. female with a PMH of T-cell lymphoma, mycosis fungoides, psoriasis, paroxysmal A. fib who is not a candidate for anticoagulation secondary to fall risk, chronic diastolic CHF, prior strokes, HTN, CAD and right DKA presented to the ED on 12/03/12 with speech difficulties that began at approximately 12:20 PM. History is provided by the patient and her daughter at bedside. Since 12/02/12, daughter noticed some tremors of the left upper extremities which continued until this morning. At approximately 12:20 PM today, patient started having trouble speaking and getting words out. The daughter also noticed possible left facial droop. There was no history of headache, loss of consciousness or asymmetrical limb weakness. In the ED, CT head negative for acute findings. Her symptoms wax and wane. Neurology was consulted and due to mild symptoms and unclear time of onset TPA was not given. Hospitalist service was requested to admit for further evaluation and management.    Review of Systems: All systems reviewed and apart from history of presenting illness, pertinent for diffuse reddening of her skin which is slightly worse than usual but no pruritus or pain. She has multiple cuts and bruises on her body. All other systems negative.    Past Medical History  Diagnosis Date  . Hypertension   . Atrial fibrillation   . Psoriasis   . Congestive heart failure (CHF)   . History of CVA (cerebrovascular accident)   . Coronary artery disease   . Myocardial infarction   . CHF (congestive heart failure)   . Shortness of breath   . H/O hiatal hernia   . Closed fracture of head of  left humerus 5/12  . Basal cell carcinoma   . 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  Flow cytometry 04/04/12: 91% cells CD4 positive CD 26 negative  . Hyperlipemia   . Pneumonia   . Wound of right leg 09/25/2012    Necrotic, open wound right lower leg; non healing  . Stroke    Past Surgical History  Procedure Laterality Date  . Tonsillectomy    . Dilation and curettage of uterus    . Eye surgery    . Cataracts    . Vertebroplasty    . Amputation  10/10/2012    Procedure: AMPUTATION ABOVE KNEE;  Surgeon: Nadara Mustard, MD;  Location: [facility] OR;  Service: Orthopedics;  Laterality: Right;   Social History:  reports that she has never smoked. She has never used smokeless tobacco. She reports that she does not drink alcohol or use illicit drugs.   Allergies  Allergen Reactions  . Penicillins Other (See Comments)    unknown  . Triamterene Other (See Comments)    unknown  . Alendronate Sodium Rash    Pt stated oh k to use biotene    Family History  Problem Relation Age of Onset  . Stomach cancer Mother   . Pneumonia Father     Prior to Admission medications   Medication Sig Start Date End Date Taking? Authorizing Provider  aspirin 81 MG chewable tablet Chew 81 mg by mouth every morning.  Yes Historical Provider, MD  atorvastatin (LIPITOR) 20 MG tablet Take 20 mg by mouth daily at 6 PM. 11/03/12  Yes Cristal Ford, MD  bexarotene (TARGRETIN) 75 MG CAPS capsule Take 75 mg by mouth daily before supper. Give with food. Protect from light. CAUTION: Chemotherapy/Biotherapy 09/25/12  Yes Historical Provider, MD  carvedilol (COREG) 12.5 MG tablet Take 12.5 mg by mouth 2 (two) times daily with a meal.   Yes Historical Provider, MD  clopidogrel (PLAVIX) 75 MG tablet Take 75 mg by mouth daily.   Yes Historical Provider, MD  Coenzyme Q10 (CO Q 10) 100  MG CAPS Take 1 capsule by mouth daily.   Yes Historical Provider, MD  diltiazem (CARDIZEM CD) 120 MG 24 hr capsule Take 120 mg by mouth daily.   Yes Historical Provider, MD  docusate sodium (COLACE) 100 MG capsule Take 100 mg by mouth daily. For constipation   Yes Historical Provider, MD  ferrous sulfate 325 (65 FE) MG tablet Take 325 mg by mouth 2 (two) times daily.   Yes Historical Provider, MD  folic acid (FOLVITE) 1 MG tablet Take 1 mg by mouth daily.   Yes Historical Provider, MD  furosemide (LASIX) 40 MG tablet Take 1 tablet (40 mg total) by mouth 2 (two) times daily. 10/20/12  Yes Belkys A Regalado, MD  hydrOXYzine (ATARAX/VISTARIL) 10 MG tablet Take 10 mg by mouth at bedtime.    Yes Historical Provider, MD  lisinopril (PRINIVIL,ZESTRIL) 5 MG tablet Take 5 mg by mouth daily.   Yes Historical Provider, MD  naproxen sodium (ANAPROX) 220 MG tablet Take 440 mg by mouth 2 (two) times daily with a meal.   Yes Historical Provider, MD  Omega-3 Fatty Acids (FISH OIL) 1000 MG CAPS Take 1,000 mg by mouth at bedtime.    Yes Historical Provider, MD  protein supplement (RESOURCE BENEPROTEIN) POWD Take 2 scoop by mouth 2 (two) times daily. In beverage   Yes Historical Provider, MD  vitamin C (ASCORBIC ACID) 500 MG tablet Take 500 mg by mouth at bedtime.    Yes Historical Provider, MD  acetaminophen (TYLENOL) 325 MG tablet Take 650 mg by mouth every 4 (four) hours as needed for fever (temperature >/= 99.5 F). 10/11/12   Meredeth Ide, MD  nitroGLYCERIN (NITROSTAT) 0.4 MG SL tablet Place 0.4 mg under the tongue every 5 (five) minutes as needed for chest pain.     Historical Provider, MD  polyethylene glycol (MIRALAX / GLYCOLAX) packet Take 17 g by mouth daily.    Historical Provider, MD   Physical Exam: Filed Vitals:   12/03/12 1445 12/03/12 1530 12/03/12 1630 12/03/12 1645  BP: 127/59 143/60 159/70 153/68  Pulse: 70 70 71 71  Temp:      TempSrc:      Resp: 19 17 16 16   SpO2: 97% 96% 96% 96%      General exam: Moderately built and nourished female patient, lying comfortably supine on the gurney in no obvious distress.  Head, eyes and ENT: Nontraumatic and normocephalic. Pupils equally reacting to light and accommodation. Oral mucosa moist.  Neck: Supple. No JVD, carotid bruit or thyromegaly.  Lymphatics: No lymphadenopathy.  Respiratory system: Clear to auscultation. No increased work of breathing.  Cardiovascular system: S1 and S2 heard, RRR. No JVD, murmurs, gallops, clicks or pedal edema.Telemetry shows: Normal sinus rhythm.   Gastrointestinal system: Abdomen is nondistended, soft and nontender. Normal bowel sounds heard. No organomegaly or masses appreciated.  Central nervous system: Alert and oriented.  Mild left facial droop. Expressive aphasia: She will be having a conversation and suddenly had difficulty finding words and gets frustrated.  Extremities: Symmetric 5 x 5 power. Peripheral pulses symmetrically felt. Right BKA stump is healed without acute findings. Patient has dressing on left shin and thigh and back covering some cuts and bruises. Dressings are clean, dry and intact.   Skin:  diffusely erythematous (chronic), dry and scaly. Patient has superficial cuts right upper back and dressing on extremities.  Musculoskeletal system: Negative exam.  Psychiatry: Pleasant and cooperative.   Labs on Admission:  Basic Metabolic Panel:  Recent Labs Lab 12/03/12 1407 12/03/12 1421  NA 137 136  K 4.2 4.1  CL 96 99  CO2 29  --   GLUCOSE 94 95  BUN 25* 26*  CREATININE 0.58 0.70  CALCIUM 9.2  --    Liver Function Tests:  Recent Labs Lab 12/03/12 1407  AST 16  ALT 11  ALKPHOS 60  BILITOT 0.2*  PROT 6.4  ALBUMIN 3.3*   No results found for this basename: LIPASE, AMYLASE,  in the last 168 hours No results found for this basename: AMMONIA,  in the last 168 hours CBC:  Recent Labs Lab 12/03/12 1407 12/03/12 1421  WBC 26.9*  --   NEUTROABS 7.8*   --   HGB 12.8 13.3  HCT 37.3 39.0  MCV 87.4  --   PLT 290  --    Cardiac Enzymes: No results found for this basename: CKTOTAL, CKMB, CKMBINDEX, TROPONINI,  in the last 168 hours  BNP (last 3 results)  Recent Labs  03/24/12 0400 10/05/12 1545 10/18/12 2010  PROBNP 2150.0* 3200.0* 4481.0*   CBG:  Recent Labs Lab 12/03/12 1413  GLUCAP 92    Radiological Exams on Admission: Ct Head Wo Contrast  12/03/2012  *RADIOLOGY REPORT*  Clinical Data: Code stroke  CT HEAD WITHOUT CONTRAST  Technique:  Contiguous axial images were obtained from the base of the skull through the vertex without contrast.  Comparison: MR 11/02/2012 and earlier studies  Findings: Atherosclerotic and physiologic intracranial calcifications.  Stable small right thalamic lacunar infarct. Diffuse parenchymal atrophy. Patchy areas of hypoattenuation in deep and periventricular white matter bilaterally. Negative for acute intracranial hemorrhage, mass lesion, acute infarction, midline shift, or mass-effect. Acute infarct may be inapparent on noncontrast CT. Ventricles and sulci symmetric. Bone windows demonstrate no focal lesion.  IMPRESSION:  1. Negative for bleed or other acute intracranial process.  2. Atrophy and nonspecific white matter changes.   I discussed the critical test results over the telephone with Dr. Amada Jupiter at the time of interpretation.   Original Report Authenticated By: D. Andria Rhein, MD     EKG: Independently reviewed. Sinus rhythm with occasional PACs. No acute findings.  Assessment/Plan Principal Problem:   CVA (cerebral infarction) Active Problems:   Leukocytosis   Chronic diastolic HF (heart failure)   Anemia   Stroke, acute, Rt brain, embolic March 2013   HTN (hypertension)   PAF (paroxysmal atrial fibrillation)   1. Possible right brain CVA, with expressive aphasia and left facial droop: Admit to telemetry for observation. Complete stroke workup including MRI MRA of brain. Patient  had 2-D echo and carotid Dopplers in January of this year and hence will not be repeated. Due to mild symptoms and unclear time of onset, no TPA given. Neurology consultation appreciated. Continue aspirin 81 mg and Plavix 75 mg for secondary stroke prophylaxis. PT, OT and ST consultation. 2. Hypertension: Allow for permissive hypertension given  recent possible stroke. Hold lisinopril but continue carvedilol and Cardizem to prevent rebound tachycardia and rapid A. fib. 3. Paroxysmal A. fib: Currently in sinus rhythm. Continue metoprolol, Cardizem, aspirin and Plavix. 4. Chronic diastolic CHF: Compensated. Continue Lasix. 5. Leukocytosis/T-cell lymphoma: White blood cell seem to be at baseline. 6. CAD: Asymptomatic in no acute EKG changes. Continue beta blockers, statins and antiplatelet agents. 7. Status post right AKA: Daughter concerned about patient's Lyrica having been stopped recently by orthopedic M.D.-advised that she follow up with him.     Code Status: DO NOT RESUSCITATE. This was confirmed with patient in the presence of her daughter.  Family Communication: Discussed with patient's daughter Ms. Janit Bern, at bedside.  Disposition Plan: Home when medically stable.   Time spent: 65 minutes  [facility] Pager [phone]  If 7PM-7AM, please contact night-coverage [email or web address] Password [facility] 12/03/2012, 4:53 PM

## 2012-12-03 NOTE — Consult Note (Signed)
Reason for Consult: Difficulty speaking Referring Physician: Hyacinth Meeker, B  CC: Difficulty speaking.   History is obtained from:   HPI: Sara [name] is a 79 y.o. female with a history of cutaneous T-Cell lymphoma who presents with difficulty speaking today. Her daughter states that she had been with her all morning and then she began having garbled speech, as well as possibly left facial droop.   She has a history of afib, but is not anticoagulated. Her daughter notes that she felt warm earlie, but she is afebrile here.   LKW: 12:30pm tpa given: no, mild symptoms.    ROS: A 14 point ROS was performed and is negative except as noted in the HPI.  Past Medical History  Diagnosis Date  . Hypertension   . Atrial fibrillation   . Psoriasis   . Congestive heart failure (CHF)   . History of CVA (cerebrovascular accident)   . Coronary artery disease   . Myocardial infarction   . CHF (congestive heart failure)   . Shortness of breath   . H/O hiatal hernia   . Closed fracture of head of left humerus 5/12  . Basal cell carcinoma   . 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  Flow cytometry 04/04/12: 91% cells CD4 positive CD 26 negative  . Hyperlipemia   . Pneumonia   . Wound of right leg 09/25/2012    Necrotic, open wound right lower leg; non healing  . Stroke     Family History: Sister - cva  Social History: Tob: none  Exam: Current vital signs: BP 136/61  Pulse 71  Temp(Src) 98.8 F (37.1 C) (Oral)  SpO2 97% Vital signs in last 24 hours: Temp:  [98.8 F (37.1 C)] 98.8 F (37.1 C) (03/23 1404) Pulse Rate:  [71] 71 (03/23 1404) BP: (136)/(61) 136/61 mmHg (03/23 1404) SpO2:  [97 %] 97 % (03/23 1404)  General: in bed, NAD CV: RRR Mental Status: Patient is awake, alert, oriented to person,  age.  She is able to answer questions and  follow commands to a degree, but does have a mild expressive aphasia.  Cranial Nerves: II: VFF, Pupils are equal, round, and reactive to light.  No papilledema. III,IV, VI: EOMI without ptosis  V: Facial sensation is symmetric to temperature VII: Facial movement is notable for a possible mild left facial droop.  VIII: hearing is intact to voice X: Uvula elevates symmetrically XI: Shoulder shrug is symmetric. XII: tongue is midline without atrophy or fasciculations.  Motor: Tone is normal. Bulk is normal. No drift, but to confrontation, possibly a mild left arm weakness at 4+/5.  5/5 strength was present in other extremities.  Sensory: Sensation is symmetric to light touch and pinin the arms and legs. Deep Tendon Reflexes: 2+ and symmetric in the biceps and left, right aka Cerebellar: FNF with no dysmetria.  Gait: No tested 2/2 aka  I have reviewed labs in epic and the results pertinent to this consultation are: CMP - unremarkable other than mild hypoalbuminemia  I have reviewed the images obtained:CT head - no acute changes.   Impression: 79 yo F with new onset expressive aphasia. I suspect a small stroke, but given how mild her symptoms are and a slightly unclear time of onset, it was decided to not give tpa.   Recommendations: 1. HgbA1c, fasting lipid panel 2. MRI, MRA  of the brain without contrast 3. Frequent neuro checks 4. Echocardiogram 5. Carotid dopplers 6. Prophylactic therapy-asa+plavix as prior to admission.  7. Telemetry monitoring 8. PT consult, OT consult, Speech consult    Ritta Slot, MD [facility] [phone]  If 7pm- 7am, please page neurology on call at [phone].

## 2012-12-03 NOTE — ED Notes (Signed)
Patient presents to Ed via EMS from [facility] with c/o TIA symptoms that started around 1230 today. Patient unable to words out when speaking. Patient leaning to right.

## 2012-12-03 NOTE — ED Provider Notes (Signed)
History     CSN: [id number]  Arrival date & time 12/03/12  1352   First MD Initiated Contact with Patient 12/03/12 1355      Chief Complaint  Patient presents with  . Transient Ischemic Attack    (Consider location/radiation/quality/duration/timing/severity/associated sxs/prior treatment) HPI Comments: 79 year old female with a history of hypertension, atrial fibrillation, congestive heart failure, history of stroke, MI and congestive heart failure as well as a basal cell carcinoma of her skin who presents with a complaint of expressive aphasia. According to family members who were visiting the patient at her nursing facility today they noticed that the patient had difficulty expressing herself and had garbled speech. The daughter who gives the primary history states that this was acute in onset, 12:30 PM, has had some waxing and waning of symptoms where the speech would normalize and then would recur again. Paramedics on arrival noted that the patient was unable to speak clearly, had garbled speech, was moving all 4 extremities noting that she A. amputation of the right lower extremity.  The patient is unable to contributory her history.  Level V caveat apply secondary to altered mental status  The history is provided by the EMS personnel, the nursing home, medical records and a relative.    Past Medical History  Diagnosis Date  . Hypertension   . Atrial fibrillation   . Psoriasis   . Congestive heart failure (CHF)   . History of CVA (cerebrovascular accident)   . Coronary artery disease   . Myocardial infarction   . CHF (congestive heart failure)   . Shortness of breath   . H/O hiatal hernia   . Closed fracture of head of left humerus 5/12  . Basal cell carcinoma   . 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  Flow cytometry 04/04/12: 91%  cells CD4 positive CD 26 negative  . Hyperlipemia   . Pneumonia   . Wound of right leg 09/25/2012    Necrotic, open wound right lower leg; non healing  . Stroke     Past Surgical History  Procedure Laterality Date  . Tonsillectomy    . Dilation and curettage of uterus    . Eye surgery    . Cataracts    . Vertebroplasty    . Amputation  10/10/2012    Procedure: AMPUTATION ABOVE KNEE;  Surgeon: Nadara Mustard, MD;  Location: [facility] OR;  Service: Orthopedics;  Laterality: Right;    Family History  Problem Relation Age of Onset  . Stomach cancer Mother   . Pneumonia Father     History  Substance Use Topics  . Smoking status: Never Smoker   . Smokeless tobacco: Never Used  . Alcohol Use: No    OB History   Grav Para Term Preterm Abortions TAB SAB Ect Mult Living                  Review of Systems  Unable to perform ROS: Mental status change    Allergies  Penicillins; Triamterene; and Alendronate sodium  Home Medications   Current Outpatient Rx  Name  Route  Sig  Dispense  Refill  . aspirin 81 MG chewable tablet   Oral   Chew 81 mg by mouth every morning.          Marland Kitchen atorvastatin (LIPITOR) 20 MG tablet   Oral  Take 20 mg by mouth daily at 6 PM.         . bexarotene (TARGRETIN) 75 MG CAPS capsule   Oral   Take 75 mg by mouth daily before supper. Give with food. Protect from light. CAUTION: Chemotherapy/Biotherapy         . carvedilol (COREG) 12.5 MG tablet   Oral   Take 12.5 mg by mouth 2 (two) times daily with a meal.         . clopidogrel (PLAVIX) 75 MG tablet   Oral   Take 75 mg by mouth daily.         . Coenzyme Q10 (CO Q 10) 100 MG CAPS   Oral   Take 1 capsule by mouth daily.         Marland Kitchen diltiazem (CARDIZEM CD) 120 MG 24 hr capsule   Oral   Take 120 mg by mouth daily.         Marland Kitchen docusate sodium (COLACE) 100 MG capsule   Oral   Take 100 mg by mouth daily. For constipation         . ferrous sulfate 325 (65 FE) MG tablet   Oral    Take 325 mg by mouth 2 (two) times daily.         . folic acid (FOLVITE) 1 MG tablet   Oral   Take 1 mg by mouth daily.         . furosemide (LASIX) 40 MG tablet   Oral   Take 1 tablet (40 mg total) by mouth 2 (two) times daily.   30 tablet   0   . hydrOXYzine (ATARAX/VISTARIL) 10 MG tablet   Oral   Take 10 mg by mouth at bedtime.          Marland Kitchen lisinopril (PRINIVIL,ZESTRIL) 5 MG tablet   Oral   Take 5 mg by mouth daily.         . naproxen sodium (ANAPROX) 220 MG tablet   Oral   Take 440 mg by mouth 2 (two) times daily with a meal.         . Omega-3 Fatty Acids (FISH OIL) 1000 MG CAPS   Oral   Take 1,000 mg by mouth at bedtime.          . protein supplement (RESOURCE BENEPROTEIN) POWD   Oral   Take 2 scoop by mouth 2 (two) times daily. In beverage         . vitamin C (ASCORBIC ACID) 500 MG tablet   Oral   Take 500 mg by mouth at bedtime.          Marland Kitchen acetaminophen (TYLENOL) 325 MG tablet   Oral   Take 650 mg by mouth every 4 (four) hours as needed for fever (temperature >/= 99.5 F).         . nitroGLYCERIN (NITROSTAT) 0.4 MG SL tablet   Sublingual   Place 0.4 mg under the tongue every 5 (five) minutes as needed for chest pain.          . polyethylene glycol (MIRALAX / GLYCOLAX) packet   Oral   Take 17 g by mouth daily.           BP 136/61  Pulse 71  Temp(Src) 98.8 F (37.1 C) (Oral)  SpO2 97%  Physical Exam  Nursing note and vitals reviewed. Constitutional: She appears well-developed and well-nourished. No distress.  HENT:  Head: Normocephalic and atraumatic.  Mouth/Throat: Oropharynx is clear and  moist. No oropharyngeal exudate.  Eyes: Conjunctivae and EOM are normal. Pupils are equal, round, and reactive to light. Right eye exhibits no discharge. Left eye exhibits no discharge. No scleral icterus.  Neck: Normal range of motion. Neck supple. No JVD present. No thyromegaly present.  Cardiovascular: Normal rate, regular rhythm, normal  heart sounds and intact distal pulses.  Exam reveals no gallop and no friction rub.   No murmur heard. Pulmonary/Chest: Effort normal and breath sounds normal. No respiratory distress. She has no wheezes. She has no rales.  Abdominal: Soft. Bowel sounds are normal. She exhibits no distension and no mass. There is no tenderness.  Musculoskeletal: Normal range of motion. She exhibits no edema and no tenderness.  Lymphadenopathy:    She has no cervical adenopathy.  Neurological: She is alert. Coordination normal.  Expressive aphasia present, the patient is able to follow commands without difficulty, no focal weakness of the bilateral upper extremities, normal grips, can't lift her left lower extremity off the bed, some movement of the right lower extremity, no obvious cranial nerve deficits 3 through 12, no facial droop  Skin: Skin is warm and dry.  Diffuse erythematous scaling rash to the skin  Psychiatric: She has a normal mood and affect. Her behavior is normal.    ED Course  Procedures (including critical care time)  Labs Reviewed  CBC - Abnormal; Notable for the following:    WBC 26.9 (*)    RDW 15.8 (*)    All other components within normal limits  DIFFERENTIAL - Abnormal; Notable for the following:    Neutrophils Relative 29 (*)    Lymphocytes Relative 62 (*)    Neutro Abs 7.8 (*)    Lymphs Abs 16.7 (*)    Monocytes Absolute 1.3 (*)    Eosinophils Absolute 1.1 (*)    All other components within normal limits  COMPREHENSIVE METABOLIC PANEL - Abnormal; Notable for the following:    BUN 25 (*)    Albumin 3.3 (*)    Total Bilirubin 0.2 (*)    GFR calc non Af Amer 78 (*)    All other components within normal limits  URINALYSIS, ROUTINE W REFLEX MICROSCOPIC - Abnormal; Notable for the following:    APPearance CLOUDY (*)    All other components within normal limits  POCT I-STAT, CHEM 8 - Abnormal; Notable for the following:    BUN 26 (*)    Calcium, Ion 1.12 (*)    All other  components within normal limits  PROTIME-INR  APTT  URINE RAPID DRUG SCREEN (HOSP PERFORMED)  GLUCOSE, CAPILLARY  TROPONIN I  POCT I-STAT TROPONIN I   Ct Head Wo Contrast  12/03/2012  *RADIOLOGY REPORT*  Clinical Data: Code stroke  CT HEAD WITHOUT CONTRAST  Technique:  Contiguous axial images were obtained from the base of the skull through the vertex without contrast.  Comparison: MR 11/02/2012 and earlier studies  Findings: Atherosclerotic and physiologic intracranial calcifications.  Stable small right thalamic lacunar infarct. Diffuse parenchymal atrophy. Patchy areas of hypoattenuation in deep and periventricular white matter bilaterally. Negative for acute intracranial hemorrhage, mass lesion, acute infarction, midline shift, or mass-effect. Acute infarct may be inapparent on noncontrast CT. Ventricles and sulci symmetric. Bone windows demonstrate no focal lesion.  IMPRESSION:  1. Negative for bleed or other acute intracranial process.  2. Atrophy and nonspecific white matter changes.   I discussed the critical test results over the telephone with Dr. Amada Jupiter at the time of interpretation.   Original Report  Authenticated By: D. Andria Rhein, MD      1. Expressive aphasia   2. Leukocytosis       MDM  The patient's exam is consistent with an expressive aphasia, etiology unknown but would consider acute stroke, her EKG shows no acute ischemia, no ST elevation or depressions, neurology has been called and is at the bedside evaluating the patient at this time. I discussed with the family members including a daughter and the patient has been made a DO NOT RESUSCITATE in the past. They would consider thrombolytic therapy if possible to help. Time since onset less than 2 hours at this time.  ED ECG REPORT  I personally interpreted this EKG   Date: 12/03/2012   Rate: 71  Rhythm: normal sinus rhythm  QRS Axis: normal  Intervals: normal  ST/T Wave abnormalities: normal  Conduction  Disutrbances:none  Narrative Interpretation:   Old EKG Reviewed: Compared with 10/18/2012, no significant changes   Care discussed with the neurologist who was paged for a code stroke. The patient's symptoms have seemed to wax and wane, her vital signs remain stable without hypoxia fever or tachycardia or hypotension. CT scan does not reveal any acute findings, lab work fairly unremarkable other than her known leukocytosis area discussed with hospitalist will admit. No thrombolytics are indicated at this time per Dr. Juline Patch, MD 12/03/12 1556

## 2012-12-03 NOTE — ED Notes (Signed)
Per Hyacinth Meeker, MD, Amada Jupiter notified, instructed to discontinue x 30 min neuro checks, will continue to monitor the pt

## 2012-12-03 NOTE — Code Documentation (Signed)
Code stroke called at 1406, Patient arrived to [facility] ED via EMS at 1352, EDP seen at 1352, Stroke team arrived at 1408, patient arrived at CT at 1430, lab arrived at 73, NIHSS 3 LSN 1230 patient started to have trouble speaking and getting words out.

## 2012-12-04 ENCOUNTER — Observation Stay (HOSPITAL_COMMUNITY): Payer: Medicare Other

## 2012-12-04 DIAGNOSIS — J9600 Acute respiratory failure, unspecified whether with hypoxia or hypercapnia: Secondary | ICD-10-CM

## 2012-12-04 DIAGNOSIS — I509 Heart failure, unspecified: Secondary | ICD-10-CM

## 2012-12-04 DIAGNOSIS — D649 Anemia, unspecified: Secondary | ICD-10-CM

## 2012-12-04 DIAGNOSIS — I5033 Acute on chronic diastolic (congestive) heart failure: Secondary | ICD-10-CM

## 2012-12-04 LAB — CBC
Hemoglobin: 13.1 g/dL (ref 12.0–15.0)
Platelets: 306 10*3/uL (ref 150–400)
RBC: 4.47 MIL/uL (ref 3.87–5.11)
WBC: 26.7 10*3/uL — ABNORMAL HIGH (ref 4.0–10.5)

## 2012-12-04 LAB — LIPID PANEL
Cholesterol: 218 mg/dL — ABNORMAL HIGH (ref 0–200)
HDL: 34 mg/dL — ABNORMAL LOW (ref >39)
LDL Cholesterol: 143 mg/dL — ABNORMAL HIGH (ref 0–99)
Total CHOL/HDL Ratio: 6.4 RATIO
Triglycerides: 207 mg/dL — ABNORMAL HIGH (ref <150)
VLDL: 41 mg/dL — ABNORMAL HIGH (ref 0–40)

## 2012-12-04 LAB — HEMOGLOBIN A1C
Hgb A1c MFr Bld: 5.7 % — ABNORMAL HIGH (ref <5.7)
Mean Plasma Glucose: 117 mg/dL — ABNORMAL HIGH (ref <117)

## 2012-12-04 MED ORDER — ATORVASTATIN CALCIUM 40 MG PO TABS
40.0000 mg | ORAL_TABLET | Freq: Every day | ORAL | Status: DC
  Administered 2012-12-04: 40 mg via ORAL
  Filled 2012-12-04 (×2): qty 1

## 2012-12-04 MED ORDER — GABAPENTIN 100 MG PO CAPS
100.0000 mg | ORAL_CAPSULE | Freq: Three times a day (TID) | ORAL | Status: DC
  Filled 2012-12-04 (×2): qty 1

## 2012-12-04 MED ORDER — GABAPENTIN 100 MG PO CAPS
100.0000 mg | ORAL_CAPSULE | Freq: Every day | ORAL | Status: DC
  Administered 2012-12-04: 100 mg via ORAL
  Filled 2012-12-04 (×2): qty 1

## 2012-12-04 MED ORDER — CARVEDILOL 25 MG PO TABS
25.0000 mg | ORAL_TABLET | Freq: Two times a day (BID) | ORAL | Status: DC
  Administered 2012-12-04 – 2012-12-05 (×2): 25 mg via ORAL
  Filled 2012-12-04 (×4): qty 1

## 2012-12-04 NOTE — Consult Note (Addendum)
WOC consult Note Reason for Consult: Pt with history of eczema and lymphoma, very thin fragile skin which obtains skin tears easily.  She has several abrasions as a result of a fall prior to admission. Daughter at bedside to assess sites and discuss plan of care. Wound type: Partial thickness Measurement: Left leg .5X.5X.1cm, dark purple hematoma, no drainage or odor. Right arm .5X.5X.1cm, dark purple hematoma, no drainage or odor. Posterior back with linear abrasions consistent with scratching, pt states this occurred when she fell. Daughter states this site was previously draining; now pink and dry. No topical treatment currently needed to this area . Dressing procedure/placement/frequency: Foam dressings to protect and promote healing to left leg and right arm. Please re-consult if further assistance is needed.  Thank-you,  Cammie Mcgee MSN, RN, CWOCN, [place], CNS [phone]

## 2012-12-04 NOTE — Progress Notes (Signed)
VASCULAR LAB PRELIMINARY  PRELIMINARY  PRELIMINARY  PRELIMINARY  Carotid duplex completed.    Preliminary report:  Bilateral:  No evidence of hemodynamically significant internal carotid artery stenosis.   Vertebral artery flow is antegrade.     Modean Mccullum, RVS 12/04/2012, 6:40 PM

## 2012-12-04 NOTE — Evaluation (Signed)
Clinical/Bedside Swallow Evaluation Patient Details  Name: Sara [name] MRN: [id number] Date of Birth: 02-Sep-1919  Today's Date: 12/04/2012 Time: 1021-1046 SLP Time Calculation (min): 25 min  Past Medical History:  Past Medical History  Diagnosis Date  . Hypertension   . Atrial fibrillation   . Psoriasis   . Congestive heart failure (CHF)   . History of CVA (cerebrovascular accident)   . Coronary artery disease   . Myocardial infarction   . CHF (congestive heart failure)   . Shortness of breath   . H/O hiatal hernia   . Closed fracture of head of left humerus 5/12  . Basal cell carcinoma   . 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  Flow cytometry 04/04/12: 91% cells CD4 positive CD 26 negative  . Hyperlipemia   . Pneumonia   . Wound of right leg 09/25/2012    Necrotic, open wound right lower leg; non healing  . Stroke    Past Surgical History:  Past Surgical History  Procedure Laterality Date  . Tonsillectomy    . Dilation and curettage of uterus    . Eye surgery    . Cataracts    . Vertebroplasty    . Amputation  10/10/2012    Procedure: AMPUTATION ABOVE KNEE;  Surgeon: Nadara Mustard, MD;  Location: [facility] OR;  Service: Orthopedics;  Laterality: Right;   HPI:  Sara [name] is a 79 y.o. female with a PMH of T-cell lymphoma, mycosis fungoides, psoriasis, paroxysmal A. fib who is not a candidate for anticoagulation secondary to fall risk, chronic diastolic CHF, prior strokes, HTN, CAD and right DKA presented to the ED on 12/03/12 with speech difficulties that began at approximately 12:20 PM. History is provided by the patient and her daughter at bedside. Since 12/02/12, daughter noticed some tremors of the left upper extremities which continued until this morning. At approximately 12:20 PM today, patient started having trouble speaking and  getting words out. The daughter also noticed possible left facial droop. There was no history of headache, loss of consciousness or asymmetrical limb weakness. In the ED, CT head negative for acute findings. Her symptoms wax and wane. Neurology was consulted and due to mild symptoms and unclear time of onset TPA was not given. Pt denies any h/o dysphagia and reports that she was on a regular diet without liquid restrictions at baseline. She has symptoms of reflux "every once in a while."   Assessment / Plan / Recommendation Clinical Impression  Pt presents with s/s of a possible oropharyngeal dysphagia that include mildly reduced hyolaryngeal movement upon palpation and trace-mild oral residue with soft solid boluses, which was reduced with a liquid wash. Despite the above, pt did not present with any overt s/s of aspiration/penetration across trials. Therefore, recommend to initiate a trial of a regular consistency diet without liquid restrictions with no straws and SLP f/u for diet tolerance. Speech will also f/u to assess cognitive-linguistic function given continued complaints of trouble expressing herself.    Aspiration Risk  Mild    Diet Recommendation Regular;Thin liquid   Liquid Administration via: Cup;No straw Medication Administration: Whole meds with liquid Supervision: Intermittent supervision to cue for compensatory strategies Compensations: Slow rate;Small sips/bites;Follow solids with liquid Postural Changes and/or Swallow Maneuvers: Seated upright 90 degrees;Upright 30-60 min after meal    Other  Recommendations Oral Care Recommendations: Oral  care BID   Follow Up Recommendations  Skilled Nursing facility    Frequency and Duration min 2x/week  2 weeks   Pertinent Vitals/Pain N/A    SLP Swallow Goals Patient will consume recommended diet without observed clinical signs of aspiration with: Supervision/safety Swallow Study Goal #1 - Progress: Other (comment) (Goal initiated  3/24) Patient will utilize recommended strategies during swallow to increase swallowing safety with: Supervision/safety Swallow Study Goal #2 - Progress: Other (comment) (3/24)   Swallow Study Prior Functional Status       General Date of Onset: 12/03/12 HPI: Sara [name] is a 79 y.o. female with a PMH of T-cell lymphoma, mycosis fungoides, psoriasis, paroxysmal A. fib who is not a candidate for anticoagulation secondary to fall risk, chronic diastolic CHF, prior strokes, HTN, CAD and right DKA presented to the ED on 12/03/12 with speech difficulties that began at approximately 12:20 PM. History is provided by the patient and her daughter at bedside. Since 12/02/12, daughter noticed some tremors of the left upper extremities which continued until this morning. At approximately 12:20 PM today, patient started having trouble speaking and getting words out. The daughter also noticed possible left facial droop. There was no history of headache, loss of consciousness or asymmetrical limb weakness. In the ED, CT head negative for acute findings. Her symptoms wax and wane. Neurology was consulted and due to mild symptoms and unclear time of onset TPA was not given. Pt denies any h/o dysphagia and reports that she was on a regular diet without liquid restrictions at baseline. She has symptoms of reflux "every once in a while." Type of Study: Bedside swallow evaluation Diet Prior to this Study: NPO (due to vocal quality during stroke swallow screen) Temperature Spikes Noted: No Respiratory Status: Room air History of Recent Intubation: No Behavior/Cognition: Alert;Cooperative;Pleasant mood Oral Cavity - Dentition: Adequate natural dentition Self-Feeding Abilities: Able to feed self Patient Positioning: Upright in bed Baseline Vocal Quality: Other (comment) (mildly rough vocal quality) Volitional Cough: Strong Volitional Swallow: Able to elicit    Oral/Motor/Sensory Function Overall Oral Motor/Sensory  Function: Impaired Labial ROM: Within Functional Limits Labial Symmetry: Within Functional Limits Labial Strength: Within Functional Limits Lingual ROM: Within Functional Limits Lingual Symmetry: Within Functional Limits Lingual Strength: Within Functional Limits Facial ROM: Reduced left (mild) Facial Symmetry: Left droop;Other (Comment) (mild) Velum: Within Functional Limits Mandible: Within Functional Limits   Ice Chips Ice chips: Impaired Presentation: Spoon Pharyngeal Phase Impairments: Decreased hyoid-laryngeal movement   Thin Liquid Thin Liquid: Impaired Presentation: Cup;Self Fed;Spoon;Straw Pharyngeal  Phase Impairments: Decreased hyoid-laryngeal movement    Nectar Thick Nectar Thick Liquid: Not tested   Honey Thick Honey Thick Liquid: Not tested   Puree Puree: Impaired Presentation: Self Fed;Spoon Pharyngeal Phase Impairments: Decreased hyoid-laryngeal movement   Solid   GO Functional Assessment Tool Used: skilled clinical judgment Functional Limitations: Swallowing Swallow Current Status ([id number]): At least 1 percent but less than 20 percent impaired, limited or restricted Swallow Goal Status [phone]): 0 percent impaired, limited or restricted  Solid: Impaired Presentation: Self Fed Oral Phase Functional Implications: Oral residue (trace) Pharyngeal Phase Impairments: Decreased hyoid-laryngeal movement       Maxcine Ham 12/04/2012,12:40 PM  Maxcine Ham, M.A. CF-SLP

## 2012-12-04 NOTE — Evaluation (Signed)
Physical Therapy Evaluation Patient Details Name: Sara [name] MRN: [id number] DOB: [birth year] Today's Date: 12/04/2012 Time: 4098-1191 PT Time Calculation (min): 30 min  PT Assessment / Plan / Recommendation Clinical Impression  Pt is a 79 yo female with h/o T-cell lymphoma, R AKA, and now with infarcts to R corona radiate, R motor strip subcortical white matter. Pt functionined near baselines however patient reports he speech to be not as clear and that she is having word finding difficulties. Recommend pt to return to [facility] and con't PT. Pt to benefit from acute skilled PT to maximize fucntional moblity and safety prior to d/c back to SNF.    PT Assessment  Patient needs continued PT services    Follow Up Recommendations  SNF;Supervision/Assistance - 24 hour    Does the patient have the potential to tolerate intense rehabilitation      Barriers to Discharge None      Equipment Recommendations  None recommended by PT    Recommendations for Other Services     Frequency Min 3X/week    Precautions / Restrictions Precautions Precautions: Fall Precaution Comments: pt with psoriasis/dry flaky skin Restrictions RLE Weight Bearing: Non weight bearing   Pertinent Vitals/Pain Pt denies pain      Mobility  Bed Mobility Bed Mobility: Supine to Sit Supine to Sit: 1: +1 Total assist;With rails;HOB elevated Supine to Sit: Patient Percentage: 30% Details for Bed Mobility Assistance: assist for trunk elevation and to bring hips to EOB Transfers Transfers: Sit to Stand;Stand to Sit;Stand Pivot Transfers Sit to Stand: 1: +2 Total assist;With upper extremity assist;From bed Sit to Stand: Patient Percentage: 30% Stand to Sit: 1: +2 Total assist;With upper extremity assist;To bed Stand to Sit: Patient Percentage: 30% Stand Pivot Transfers: 1: +2 Total assist Stand Pivot Transfers: Patient Percentage: 20% Details for Transfer Assistance: 2 person underarm assist with use of bed  pad. pt was able to pivot on L LE hwoever strong dependence on therapist and tech Ambulation/Gait Ambulation/Gait Assistance: Not tested (comment)    Exercises     PT Diagnosis: Generalized weakness  PT Problem List: Decreased strength;Decreased activity tolerance;Decreased balance;Decreased mobility PT Treatment Interventions: DME instruction;Gait training;Functional mobility training;Therapeutic activities;Therapeutic exercise;Patient/family education   PT Goals Acute Rehab PT Goals PT Goal Formulation: With patient Time For Goal Achievement: 12/18/12 Potential to Achieve Goals: Fair Pt will go Supine/Side to Sit: with min assist PT Goal: Supine/Side to Sit - Progress: Goal set today Pt will go Sit to Supine/Side: with min assist PT Goal: Sit to Supine/Side - Progress: Goal set today Pt will go Sit to Stand: with mod assist;with upper extremity assist PT Goal: Sit to Stand - Progress: Goal set today Pt will go Stand to Sit: with min assist PT Goal: Stand to Sit - Progress: Goal set today Pt will Transfer Bed to Chair/Chair to Bed: with max assist PT Transfer Goal: Bed to Chair/Chair to Bed - Progress: Goal set today Pt will Stand: with max assist;with bilateral upper extremity support PT Goal: Stand - Progress: Goal set today  Visit Information  Last PT Received On: 12/04/12 Assistance Needed: +2    Subjective Data  Subjective: pt received supine in bed agreeable to PT   Prior Functioning  Home Living Lives With:  ([facility]) Available Help at Discharge: Skilled Nursing Facility Type of Home: Skilled Nursing Facility Prior Function Level of Independence: Needs assistance Needs Assistance: Bathing;Dressing;Toileting;Transfers Bath: Maximal Dressing: Moderate Toileting: Moderate Transfer Assistance: facility uses hoyer lift for  transfer in/out of w/c Able to Take Stairs?: No Driving: No Vocation: Retired Comments: pt was received PT/OT at [facility]  PTA Communication Communication:  (mild word finding difficulties, pt reports speech to be less clear Dominant Hand: Right    Cognition  Cognition Overall Cognitive Status: Impaired Area of Impairment: Memory;Safety/judgement Arousal/Alertness: Awake/alert Orientation Level: Disoriented to (year, despite multiple v/c's) Behavior During Session: [facility] for tasks performed Memory Deficits: short term deficits, pt unable to remember year despite multiple reminders. pt perseverated on "1944"  Safety/Judgement: Decreased safety judgement for tasks assessed Safety/Judgement - Other Comments: pt perseverated on "I can do it myself." in reference to transfer into chair    Extremity/Trunk Assessment Right Upper Extremity Assessment RUE ROM/Strength/Tone: Deficits RUE ROM/Strength/Tone Deficits: generalized weakness for age' Left Upper Extremity Assessment LUE ROM/Strength/Tone: Deficits LUE ROM/Strength/Tone Deficits: generalized weakness for age Right Lower Extremity Assessment RLE ROM/Strength/Tone:  (AKA) Left Lower Extremity Assessment LLE ROM/Strength/Tone: Deficits LLE ROM/Strength/Tone Deficits: generalized weakness for age, grossly 4/5 Trunk Assessment Trunk Assessment: Normal (skin tears from facility on back)   Balance Balance Balance Assessed: Yes Static Sitting Balance Static Sitting - Balance Support: Bilateral upper extremity supported;Feet supported Static Sitting - Level of Assistance: 5: Stand by assistance Static Sitting - Comment/# of Minutes: 5 Dynamic Sitting Balance Dynamic Sitting - Balance Support: No upper extremity supported Dynamic Sitting - Level of Assistance: 4: Min assist Dynamic Sitting - Balance Activities:  (posterior lean during MMT)  End of Session PT - End of Session Equipment Utilized During Treatment: Gait belt Activity Tolerance: Patient tolerated treatment well Patient left: in chair;with call bell/phone within reach;with family/visitor  present Nurse Communication: Mobility status;Need for lift equipment (2 person std pvt or use of lift to return pt back to bed)  GP Functional Assessment Tool Used: clinical judgement Functional Limitation: Mobility: Walking and moving around Mobility: Walking and Moving Around Current Status (W0981): At least 80 percent but less than 100 percent impaired, limited or restricted Mobility: Walking and Moving Around Goal Status [id number]): At least 80 percent but less than 100 percent impaired, limited or restricted   Marcene Brawn 12/04/2012, 10:29 AM  Lewis Shock, PT, DPT Pager #: [phone] Office #: [phone]

## 2012-12-04 NOTE — Clinical Social Work Psychosocial (Signed)
Clinical Social Work Department BRIEF PSYCHOSOCIAL ASSESSMENT 12/04/2012  Patient:  JAMESETTA, GREENHALGH A     Account Number:  [id number]     Admit date:  12/03/2012  Clinical Social Worker:  Peggyann Shoals  Date/Time:  12/04/2012 04:10 PM  Referred by:  Physician  Date Referred:  12/04/2012 Referred for  Other - See comment   Other Referral:   Admitted from facility.   Interview type:  Family Other interview type:    PSYCHOSOCIAL DATA Living Status:  FACILITY Admitted from facility:  [facility] Level of care:  Skilled Nursing Facility Primary support name:  Sheran Spine Primary support relationship to patient:  CHILD, ADULT Degree of support available:   supportive.    CURRENT CONCERNS Current Concerns  Post-Acute Placement   Other Concerns:    SOCIAL WORK ASSESSMENT / PLAN CSW met with pt and daughter to address consult. CSW introduced herself and explained role of social work. CSW explained the process of returning to SNF.    Pt is a resident of [facility]. Pt would like to return when she is medically ready.    CSW updated [facility]. Pt is able to return. CSW will continue to follow.   Assessment/plan status:  Psychosocial Support/Ongoing Assessment of Needs Other assessment/ plan:   Information/referral to community resources:   SNF list    PATIENT'S/FAMILY'S RESPONSE TO PLAN OF CARE: Pt was pleasant and agreeable to returning.   Dede Query, MSW, LCSW [phone]

## 2012-12-04 NOTE — Progress Notes (Signed)
Pt stating she saw "a whole family of bugs in her bed,  Some of them had a curly tail.  Checked linens, to reassure patient, remains positive that she saw some bugs.

## 2012-12-04 NOTE — Progress Notes (Signed)
Stroke Team Progress Note  HISTORY Sara [name] is a 79 y.o. female with a history of cutaneous T-Cell lymphoma who presents with difficulty speaking on 12/03/2012. Her daughter states that she had been with her all morning and then she began having garbled speech, as well as possibly left facial droop. She has a history of afib, but is not anticoagulated. Her daughter notes that she felt warm earlier, but she is afebrile here. Patient was not a TPA candidate secondary to mild symptoms. She was admitted for further evaluation and treatment.  SUBJECTIVE Her daughter is at the bedside.  Overall she feels her condition is stable. She is concerned about her mom's phantom pain that returned when she was weaned off neurontin.  OBJECTIVE Most recent Vital Signs: Filed Vitals:   12/04/12 0043 12/04/12 0248 12/04/12 0549 12/04/12 1023  BP: 146/56 137/52 158/66 151/65  Pulse: 76 83 79 78  Temp: 97.2 F (36.2 C) 98.3 F (36.8 C) 98.7 F (37.1 C) 98.3 F (36.8 C)  TempSrc: Oral Oral Oral Oral  Resp: 20 19 20 20   SpO2: 95% 96% 96% 98%   CBG (last 3)   Recent Labs  12/03/12 1413  GLUCAP 92    IV Fluid Intake:   . sodium chloride 20 mL/hr at 12/03/12 1836    MEDICATIONS  . aspirin  81 mg Oral q morning - 10a  . atorvastatin  20 mg Oral q1800  . bexarotene  75 mg Oral QAC supper  . carvedilol  12.5 mg Oral BID WC  . Chlorhexidine Gluconate Cloth  6 each Topical Q0600  . clopidogrel  75 mg Oral Daily  . diltiazem  120 mg Oral Daily  . docusate sodium  100 mg Oral Daily  . enoxaparin (LOVENOX) injection  40 mg Subcutaneous Q24H  . ferrous sulfate  325 mg Oral BID  . folic acid  1 mg Oral Daily  . furosemide  40 mg Oral BID  . hydrOXYzine  10 mg Oral QHS  . mupirocin ointment  1 application Nasal BID  . naproxen  500 mg Oral BID WC  . omega-3 acid ethyl esters  1 g Oral QHS  . polyethylene glycol  17 g Oral Daily  . protein supplement  2 scoop Oral BID   PRN:  acetaminophen,  acetaminophen, albuterol, nitroGLYCERIN  Diet:  General thin liquids Activity:  OOB with assistance DVT Prophylaxis:  Lovenox 40 mg sq daily   CLINICALLY SIGNIFICANT STUDIES Basic Metabolic Panel:  Recent Labs Lab 12/03/12 1407 12/03/12 1421  NA 137 136  K 4.2 4.1  CL 96 99  CO2 29  --   GLUCOSE 94 95  BUN 25* 26*  CREATININE 0.58 0.70  CALCIUM 9.2  --    Liver Function Tests:  Recent Labs Lab 12/03/12 1407  AST 16  ALT 11  ALKPHOS 60  BILITOT 0.2*  PROT 6.4  ALBUMIN 3.3*   CBC:  Recent Labs Lab 12/03/12 1407 12/03/12 1421 12/04/12 0820  WBC 26.9*  --  26.7*  NEUTROABS 7.8*  --   --   HGB 12.8 13.3 13.1  HCT 37.3 39.0 38.7  MCV 87.4  --  86.6  PLT 290  --  306   Coagulation:  Recent Labs Lab 12/03/12 1407  LABPROT 13.6  INR 1.05   Cardiac Enzymes:  Recent Labs Lab 12/03/12 1945  TROPONINI <0.30   Urinalysis:  Recent Labs Lab 12/03/12 1406  COLORURINE YELLOW  LABSPEC 1.012  PHURINE 6.5  GLUCOSEU  NEGATIVE  HGBUR NEGATIVE  BILIRUBINUR NEGATIVE  KETONESUR NEGATIVE  PROTEINUR NEGATIVE  UROBILINOGEN 0.2  NITRITE NEGATIVE  LEUKOCYTESUR NEGATIVE   Lipid Panel    Component Value Date/Time   CHOL 218* 12/04/2012 0820   TRIG 207* 12/04/2012 0820   HDL 34* 12/04/2012 0820   CHOLHDL 6.4 12/04/2012 0820   VLDL 41* 12/04/2012 0820   LDLCALC 143* 12/04/2012 0820   HgbA1C  Lab Results  Component Value Date   HGBA1C 5.7* 11/02/2012    Urine Drug Screen:     Component Value Date/Time   LABOPIA NONE DETECTED 12/03/2012 1406   COCAINSCRNUR NONE DETECTED 12/03/2012 1406   LABBENZ NONE DETECTED 12/03/2012 1406   AMPHETMU NONE DETECTED 12/03/2012 1406   THCU NONE DETECTED 12/03/2012 1406   LABBARB NONE DETECTED 12/03/2012 1406    Alcohol Level: No results found for this basename: ETH,  in the last 168 hours  CT of the brain  12/03/2012  1. Negative for bleed or other acute intracranial process.  2. Atrophy and nonspecific white matter changes.  MRI of  the brain  12/04/2012  1.  Two areas of possible acute small vessel ischemia, superimposed on chronic lacunar infarcts: - Right motor strip subcortical white matter, - Right corona radiata. 2.  No mass effect or hemorrhage.  Otherwise stable underlying advanced chronic small vessel ischemia.  MRA of the brain 12/04/2012   Stable since 2013 and largely negative intracranial MRA.  There is unchanged mild stenosis of the left ACA A1 segment.   2D Echocardiogram    Carotid Doppler    CXR  12/03/2012  No acute cardiopulmonary findings.  EKG  normal sinus rhythm.   Therapy Recommendations SNF  Physical Exam   Pleasant elderly lady currently not in distress.Awake alert. Afebrile. Head is nontraumatic. Neck is supple without bruit. Hearing is normal. Cardiac exam no murmur or gallop. Lungs are clear to auscultation. Distal pulses are well in left leg only. 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 Extraocular movements are full range with mild right gaze preference. Slight decreased blink to threat on the left compared to the right. Mild left lower face asymmetry. Tongue midline. Mild left   lower extremity drift. Mild diminished fine finger movements on left. Orbits right over left upper extremity. Mild left grip weak.. Diminished left body sensation . Normal coordination. ASSESSMENT Sara [name] is a 79 y.o. female presenting with sudden onset dysarthria. Imaging confirms a right motor strip subcortical white matter and right corona radiata infarct. Infarcts felt to be embolic secondary to known atrial fibrillation.  On aspirin 81 mg orally every day prior to admission. Now on aspirin 81 mg orally every day and clopidogrel 75 mg orally every day for secondary stroke prevention. Patient with resultant dysarthria. Work up underway.   Phantom limb pain  Mycosis fungoides, cutaneous  lymphoma atrial fibrillation Hypertension Hyperlipidemia, LDL 143, on statin PTA, on statin now, goal LDL < 100     carotid stent, placed by Dr Allyson Sabal Hx stroke CAD - MI Non healing wound right leg, necrotic   Hospital day # 1  TREATMENT/PLAN  Continue aspirin 81 mg orally every day and clopidogrel 75 mg orally every day for secondary stroke prevention. Consider change to anticoagulation given afib. Will need to weigh risk/benefit ratio pending carotid eval. Daughter ok with anticoagulation per Dr. Mahala Menghini.  Resume neurontin at 100 mg  tid  Evaluate carotid stent for possible reocclusion  F/u 2D  Annie Main, MSN, RN, ANVP-BC, ANP-BC, GNP-BC [facility] Pager: [phone] 12/04/2012 11:56 AM  I have personally obtained a history, examined the patient, evaluated imaging results, and formulated the assessment and plan of care. I agree with the above. Delia Heady, MD
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
## 2012-12-04 NOTE — Progress Notes (Signed)
Patient complains of seeing bugs in her bed also now complains of seeing colors green and red around ceiling lights. No bugs found in patient room. Call placed to Dr. Roseanne Reno.No new orders received will continue to monitor patient.

## 2012-12-05 MED ORDER — ATORVASTATIN CALCIUM 40 MG PO TABS: 40.0000 mg | ORAL_TABLET | Freq: Every day | ORAL | Status: DC

## 2012-12-05 MED ORDER — DABIGATRAN ETEXILATE MESYLATE 150 MG PO CAPS
150.0000 mg | ORAL_CAPSULE | Freq: Two times a day (BID) | ORAL | Status: DC
  Filled 2012-12-05: qty 1

## 2012-12-05 MED ORDER — DABIGATRAN ETEXILATE MESYLATE 150 MG PO CAPS: 150.0000 mg | ORAL_CAPSULE | Freq: Two times a day (BID) | ORAL | Status: DC

## 2012-12-05 MED ORDER — CARVEDILOL 25 MG PO TABS: 25.0000 mg | ORAL_TABLET | Freq: Two times a day (BID) | ORAL | Status: DC

## 2012-12-05 MED ORDER — GABAPENTIN 100 MG PO CAPS: 100.0000 mg | ORAL_CAPSULE | Freq: Every day | ORAL | Status: DC

## 2012-12-05 NOTE — Progress Notes (Signed)
  Echocardiogram 2D Echocardiogram has been performed.  Sara [name] 12/05/2012, 11:52 AM

## 2012-12-05 NOTE — Care Management Note (Signed)
    Page 1 of 1   12/05/2012     3:21:27 PM   CARE MANAGEMENT NOTE 12/05/2012  Patient:  Sara [name],Sara [name]   Account Number:  [id number]  Date Initiated:  12/05/2012  Documentation initiated by:  [facility]  Subjective/Objective Assessment:   admitted with slurred speech, facial droop, from SNF     Action/Plan:   plan return to SNF   Anticipated DC Date:  12/05/2012   Anticipated DC Plan:  SKILLED NURSING FACILITY  In-house referral  Clinical Social Worker      DC Planning Services  CM consult      Choice offered to / List presented to:             Status of service:  Completed, signed off Medicare Important Message given?   (If response is "NO", the following Medicare IM given date fields will be blank) Date Medicare IM given:   Date Additional Medicare IM given:    Discharge Disposition:  SKILLED NURSING FACILITY  Per UR Regulation:  Reviewed for med. necessity/level of care/duration of stay  If discussed at Long Length of Stay Meetings, dates discussed:    Comments:

## 2012-12-05 NOTE — Evaluation (Signed)
Speech Language Pathology Evaluation Patient Details Name: Sara [name] MRN: [id number] DOB: November 22, 1918 Today's Date: 12/05/2012 Time: 1035-1050 SLP Time Calculation (min): 15 min  Problem List:  Patient Active Problem List  Diagnosis  . Leukocytosis  . Hypertension, uncontrolled on adm 03/20/12  . Paroxysmal atrial fibrillation, NSR on adm 03/20/12, poor Coumadin candidate secondary to falls  . Chronic diastolic HF (heart failure)  . Anemia  . Stroke, acute, Rt brain, embolic March 2013  . Cellulitis  . HCAP (healthcare-associated pneumonia)  . SOB (shortness of breath)  . Acute on chronic diastolic CHF (congestive heart failure)  . PVD, Lt CA stent 04/02/11  . Acute respiratory failure  . 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 Atrial fibrillation with RVR  . Chest pain  . Ankle pain, right  . Wound of right leg  . Syncope  . TIA (transient ischemic attack)  . HTN (hypertension)  . PAF (paroxysmal atrial fibrillation)  . Right thigh pain  . Dehydration  . Hypotension  . Fever  . UTI (lower urinary tract infection)  . CVA (cerebral infarction)   Past Medical History:  Past Medical History  Diagnosis Date  . Hypertension   . Atrial fibrillation   . Psoriasis   . Congestive heart failure (CHF)   . History of CVA (cerebrovascular accident)   . Coronary artery disease   . Myocardial infarction   . CHF (congestive heart failure)   . Shortness of breath   . H/O hiatal hernia   . Closed fracture of head of left humerus 5/12  . Basal cell carcinoma   . 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  Flow cytometry 04/04/12: 91% cells CD4 positive CD 26 negative  . Hyperlipemia   . Pneumonia   . Wound of right leg 09/25/2012    Necrotic, open wound right lower leg; non healing  . Stroke    Past Surgical  History:  Past Surgical History  Procedure Laterality Date  . Tonsillectomy    . Dilation and curettage of uterus    . Eye surgery    . Cataracts    . Vertebroplasty    . Amputation  10/10/2012    Procedure: AMPUTATION ABOVE KNEE;  Surgeon: Nadara Mustard, MD;  Location: [facility] OR;  Service: Orthopedics;  Laterality: Right;   HPI:  Sara [name] is a 79 y.o. female with a PMH of T-cell lymphoma, mycosis fungoides, psoriasis, paroxysmal A. fib who is not a candidate for anticoagulation secondary to fall risk, chronic diastolic CHF, prior strokes, HTN, CAD and right DKA presented to the ED on 12/03/12 with speech difficulties that began at approximately 12:20 PM. History is provided by the patient and her daughter at bedside. Since 12/02/12, daughter noticed some tremors of the left upper extremities which continued until this morning. At approximately 12:20 PM today, patient started having trouble speaking and getting words out. The daughter also noticed possible left facial droop. There was no history of headache, loss of consciousness or asymmetrical limb weakness. In the ED, CT head negative for acute findings.MRI indicated two areas of possible acute small vessel ischemia, superimposed   Assessment / Plan / Recommendation Clinical Impression  Cognitive-linguistic evaluation complete. Noted events of last evening in which patient presented with hallucinations (seeing bugs in her bed, christmas lights on the wall). Today  however, patient alert, oriented, language intact, and patient with good memory of events and reasoning skills regarding possibility of these events never having really occurred. ? component of dementia however daughter does not report any s/s prior to CVA. Patient judged safe for return to SNF. GIven functional nature of cognitive-linguistic skills at this time, will defer f/u to SNF for further higher level diagnostic cognitive-linguistic treatment.     SLP Assessment  All further  Speech Lanaguage Pathology  needs can be addressed in the next venue of care    Follow Up Recommendations  Skilled Nursing facility       Pertinent Vitals/Pain None reported   SLP Goals  SLP Goals Progress/Goals/Alternative treatment plan discussed with pt/caregiver and they: Agree  SLP Evaluation Prior Functioning  Cognitive/Linguistic Baseline: Within functional limits Type of Home: Independent living facility Lives With: Alone Available Help at Discharge: Skilled Nursing Facility Vocation: Retired   IT consultant  Overall Cognitive Status: Impaired Arousal/Alertness: Awake/alert Orientation Level: Oriented X4 Attention: Focused;Sustained Focused Attention: Appears intact Sustained Attention: Appears intact Memory: Appears intact Awareness: Appears intact Problem Solving: Appears intact Executive Function: Reasoning Reasoning: Appears intact Behaviors:  (increased giggling; new per daughter) Safety/Judgment: Appears intact    Comprehension  Auditory Comprehension Overall Auditory Comprehension: Appears within functional limits for tasks assessed Visual Recognition/Discrimination Discrimination: Within Function Limits Reading Comprehension Reading Status: Not tested    Expression Expression Primary Mode of Expression: Verbal Verbal Expression Overall Verbal Expression: Appears within functional limits for tasks assessed Written Expression Dominant Hand: Right   Oral / Motor Oral Motor/Sensory Function Overall Oral Motor/Sensory Function: Appears within functional limits for tasks assessed Motor Speech Overall Motor Speech: Appears within functional limits for tasks assessed   GO Functional Assessment Tool Used: skilled clinical judgment Functional Limitations: Swallowing Swallow Current Status (Z6109): At least 1 percent but less than 20 percent impaired, limited or restricted Swallow Goal Status [phone]): At least 1 percent but less than 20 percent impaired,  limited or restricted Swallow Discharge Status [phone]): 0 percent impaired, limited or restricted  Sara Lango MA, CCC-SLP [phone]  Sara [name] 12/05/2012, 11:57 AM

## 2012-12-05 NOTE — Discharge Summary (Signed)
Physician Discharge Summary  Sara [name] WGN:[id number] DOB: 04-25-1919 DOA: 12/03/2012  PCP: Londell Moh, MD  Admit date: 12/03/2012 Discharge date: 12/05/2012  Time spent: 35 minutes  Recommendations for Outpatient Follow-up:  1. Needs Bmet and CBC in a week. 2. Needs re-assessment by Oncologist soon for skin issues 3. Consider simplifying medications if possible 4. Probably not good candidate for both anti-platelet and anti-coagulant [see med changes]  Discharge Diagnoses:  Principal Problem:   CVA (cerebral infarction) Active Problems:   Leukocytosis   Chronic diastolic HF (heart failure)   Anemia   Stroke, acute, Rt brain, embolic March 2013   HTN (hypertension)   PAF (paroxysmal atrial fibrillation)   Discharge Condition: good  Diet recommendation: regular-needs speech follow-up  There were no vitals filed for this visit.  History of present illness:  79 yr old female with admitted with speech diffculties 3.23.14 + facial droop.  She has a fairly extensive past medical h/o and has had CVA's x 2 in 2006, 2013 as well as syncopy.  She presented to ED with facial drooping and slurred speech, but no focal muscular weakness She was admitted as per below    Hospital Course:  1. New R sided CVAs- likely Embolic from Afib vs Prior Carotid occlusive disease Per neurology-discussed with Dr. Rolm Gala neg as per below. Previously not thought to be a coumadin candidate or anticoag given fall risks.  Discussion with both Neurology as well as Cardiologist Dr. Allyson Sabal who placed her R ICA stent were made and it was decided soley to d/c her on Pradaxa.  Her slurred speech resolved and SLP recommended out-patient re-eval and SNF 2. Afib c RVR Chad2vasc2=7-HR in 130' on 3/24 therefore increased coreg to 25 bid from 12.5 Continue Cardizem 120 daily.  Courtesy consulted Dr. Assunta Found very much his input 3. Compensated CHF, Last EF 60-65% c h/o mitral regurgitation-continue  lasix 40 bid 4. 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 Cyndie Chime. Currently stable-add back gabapentin 100 qhs at daughter's request-continue Targretin 75 qhs as well 5. Leukocytosis-2/2 to #4 6. H/o htn-moderate control only-see #2 7. HLd-LDL143, HDL 34. Increase atorvastatin to 40 mg daily for high intensity therapy 8. Anemia-Continue feso4 325 and folic acid   Procedures: CT of the brain 12/03/2012 1. Negative for bleed or other acute intracranial process. 2. Atrophy and nonspecific white matter changes.  MRI of the brain 12/04/2012 1. Two areas of possible acute small vessel ischemia, superimposed on chronic lacunar infarcts: - Right motor strip subcortical white matter, - Right corona radiata. 2. No mass effect or hemorrhage. Otherwise stable underlying advanced chronic small vessel ischemia.  MRA of the brain 12/04/2012 Stable since 2013 and largely negative intracranial MRA. There is unchanged mild stenosis of the left ACA A1 segment.  2D Echocardiogram  Carotid Doppler Bilateral: No evidence of hemodynamically significant internal carotid artery stenosis. Vertebral artery flow is antegrade  CXR 12/03/2012 No acute cardiopulmonary findings.  EKG normal sinus rhythm.   Consultations:  Neurology  Cardiology  Discharge Exam: Filed Vitals:   12/05/12 0200 12/05/12 0600 12/05/12 1045 12/05/12 1425  BP: 142/46 155/54 133/58 143/38  Pulse: 71 73 68 73  Temp: 97.6 F (36.4 C) 97.8 F (36.6 C) 97.8 F (36.6 C) 98.2 F (36.8 C)  TempSrc: Oral Oral Oral Oral  Resp: 20 20 20 20   SpO2: 97% 97% 100%    Well  Not confused  And very pleasant.  Hada full lunch  General: EOMI,NCAT no slurring  of speech, no deficits grossly noted Cardiovascular: s1 s2 RRR tele no afib Respiratory: clear  Discharge Instructions  Discharge Orders   Future Appointments Provider Department Dept Phone   12/19/2012 4:00 PM Levert Feinstein, MD [facility] [phone]   Future Orders Complete By Expires     Call MD for:  difficulty breathing, headache or visual disturbances  As directed     Call MD for:  hives  As directed     Call MD for:  persistant nausea and vomiting  As directed     Call MD for:  redness, tenderness, or signs of infection (pain, swelling, redness, odor or green/yellow discharge around incision site)  As directed     Diet - low sodium heart healthy  As directed     Increase activity slowly  As directed         Medication List    STOP taking these medications       acetaminophen 325 MG tablet  Commonly known as:  TYLENOL     aspirin 81 MG chewable tablet     clopidogrel 75 MG tablet  Commonly known as:  PLAVIX     Co Q 10 100 MG Caps     docusate sodium 100 MG capsule  Commonly known as:  COLACE     naproxen sodium 220 MG tablet  Commonly known as:  ANAPROX      TAKE these medications       atorvastatin 40 MG tablet  Commonly known as:  LIPITOR  Take 1 tablet (40 mg total) by mouth daily at 6 PM.     bexarotene 75 MG Caps capsule  Commonly known as:  TARGRETIN  Take 75 mg by mouth daily before supper. Give with food. Protect from light. CAUTION: Chemotherapy/Biotherapy     carvedilol 25 MG tablet  Commonly known as:  COREG  Take 1 tablet (25 mg total) by mouth 2 (two) times daily with a meal.     dabigatran 150 MG Caps  Commonly known as:  PRADAXA  Take 1 capsule (150 mg total) by mouth every 12 (twelve) hours.     diltiazem 120 MG 24 hr capsule  Commonly known as:  CARDIZEM CD  Take 120 mg by mouth daily.     ferrous sulfate 325 (65 FE) MG tablet  Take 325 mg by mouth 2 (two) times daily.     Fish Oil 1000 MG Caps  Take 1,000 mg by mouth at bedtime.     folic acid 1 MG tablet  Commonly known as:  FOLVITE  Take 1 mg by mouth daily.     furosemide 40 MG tablet  Commonly known as:  LASIX  Take 1 tablet (40 mg total) by mouth 2 (two) times daily.     gabapentin 100 MG  capsule  Commonly known as:  NEURONTIN  Take 1 capsule (100 mg total) by mouth at bedtime.     hydrOXYzine 10 MG tablet  Commonly known as:  ATARAX/VISTARIL  Take 10 mg by mouth at bedtime.     lisinopril 5 MG tablet  Commonly known as:  PRINIVIL,ZESTRIL  Take 5 mg by mouth daily.     nitroGLYCERIN 0.4 MG SL tablet  Commonly known as:  NITROSTAT  Place 0.4 mg under the tongue every 5 (five) minutes as needed for chest pain.     polyethylene glycol packet  Commonly known as:  MIRALAX / GLYCOLAX  Take 17 g by  mouth daily.     protein supplement Powd  Take 2 scoop by mouth 2 (two) times daily. In beverage     vitamin C 500 MG tablet  Commonly known as:  ASCORBIC ACID  Take 500 mg by mouth at bedtime.          The results of significant diagnostics from this hospitalization (including imaging, microbiology, ancillary and laboratory) are listed below for reference.    Significant Diagnostic Studies: Dg Chest 2 View  12/03/2012  *RADIOLOGY REPORT*  Clinical Data: CVA.  CHEST - 2 VIEW  Comparison: 11/01/2012.  Findings: The cardiac silhouette, mediastinal and hilar contours are within normal limits and stable.  There is tortuosity and calcification of the thoracic aorta.  The lungs are clear.  No pleural effusion.  The bony structures are intact.  There is a remote left humeral neck fracture and vertebral augmentation changes.  IMPRESSION: No acute cardiopulmonary findings.   Original Report Authenticated By: Rudie Meyer, M.D.    Ct Head Wo Contrast  12/03/2012  *RADIOLOGY REPORT*  Clinical Data: Code stroke  CT HEAD WITHOUT CONTRAST  Technique:  Contiguous axial images were obtained from the base of the skull through the vertex without contrast.  Comparison: MR 11/02/2012 and earlier studies  Findings: Atherosclerotic and physiologic intracranial calcifications.  Stable small right thalamic lacunar infarct. Diffuse parenchymal atrophy. Patchy areas of hypoattenuation in deep and  periventricular white matter bilaterally. Negative for acute intracranial hemorrhage, mass lesion, acute infarction, midline shift, or mass-effect. Acute infarct may be inapparent on noncontrast CT. Ventricles and sulci symmetric. Bone windows demonstrate no focal lesion.  IMPRESSION:  1. Negative for bleed or other acute intracranial process.  2. Atrophy and nonspecific white matter changes.   I discussed the critical test results over the telephone with Dr. Amada Jupiter at the time of interpretation.   Original Report Authenticated By: D. Andria Rhein, MD    Mr Brain Wo Contrast  12/04/2012  *RADIOLOGY REPORT*  Clinical Data:  79 year old female with difficulty speaking, left facial droop.  Comparison: Head CT 12/03/2012 and earlier.  Brain MRI 11/02/2012 and earlier. Brain MRA 12/08/2011.  MRI HEAD WITHOUT CONTRAST  Technique: Multiplanar, multiecho pulse sequences of the brain and surrounding structures were obtained according to standard protocol without intravenous contrast.  Findings: There is a very small volume of restricted diffusion along the margins of a chronic right superior frontal gyrus motor strip subcortical white matter lacune (series 4 image 22, series 400 image 22).  Questionable additional small area of white matter restricted diffusion in the right corona radiata on series 4 image 18.  No definite acute T2 or FLAIR hyperintensity at this site.  No other acute diffusion abnormality identified. Major intracranial vascular flow voids are stable.  Stable cerebral volume.  No ventriculomegaly. No acute intracranial hemorrhage identified.  No midline shift, mass effect, or evidence of mass lesion.  Negative pituitary, cervicomedullary junction and visualized cervical spine.  Normal bone marrow signal.  Numerous chronic lacunar infarcts in the deep gray matter nuclei, brainstem, cerebral white matter, and to a lesser extent cerebellum.  Confluent cerebral white matter T2 and FLAIR hyperintensity  elsewhere.  Stable orbit soft tissues, paranasal sinuses, mastoids and scalp soft tissues.  IMPRESSION: 1.  Two areas of possible acute small vessel ischemia, superimposed on chronic lacunar infarcts: - Right motor strip subcortical white matter, - Right corona radiata. 2.  No mass effect or hemorrhage.  Otherwise stable underlying advanced chronic small vessel ischemia. 3.  MRA findings  are below.  MRA HEAD WITHOUT CONTRAST  Technique: Angiographic images of the Circle of Willis were obtained using MRA technique without  intravenous contrast.  Findings: Stable antegrade flow in the posterior circulation supplied by a dominant and mildly dolichoectatic distal right vertebral artery.  Distal left vertebral artery functionally terminates in PICA.  Both PICA origins are patent.  Both AICA origins are patent.  Stable mildly dolichoectatic basilar artery without stenosis.  SCA and PCA origins are stable within normal limits.  Normal left posterior communicating artery, the right is more diminutive or absent.  Bilateral PCA branches are within normal limits.  Stable antegrade flow in both ICA siphons.  Ophthalmic and left posterior communicating artery origins are stable and within normal limits.  Carotid termini, MCA, and ACA origins remain normal.  Stable mild stenosis of the distal left ACA A1 segment.  Normal anterior communicating artery and other visualized ACA branches. Left MCA branches are stable within normal limits.  Right MCA M1 segment is stable and within normal limits.  Right MCA bifurcation remains normal.  Visualized right MCA branches are stable within normal limits.  IMPRESSION: Stable since 2013 and largely negative intracranial MRA.  There is unchanged mild stenosis of the left ACA A1 segment.   Original Report Authenticated By: Erskine Speed, M.D.    Mr Mra Head/brain Wo Cm  12/04/2012  *RADIOLOGY REPORT*  Clinical Data:  79 year old female with difficulty speaking, left facial droop.  Comparison:  Head CT 12/03/2012 and earlier.  Brain MRI 11/02/2012 and earlier. Brain MRA 12/08/2011.  MRI HEAD WITHOUT CONTRAST  Technique: Multiplanar, multiecho pulse sequences of the brain and surrounding structures were obtained according to standard protocol without intravenous contrast.  Findings: There is a very small volume of restricted diffusion along the margins of a chronic right superior frontal gyrus motor strip subcortical white matter lacune (series 4 image 22, series 400 image 22).  Questionable additional small area of white matter restricted diffusion in the right corona radiata on series 4 image 18.  No definite acute T2 or FLAIR hyperintensity at this site.  No other acute diffusion abnormality identified. Major intracranial vascular flow voids are stable.  Stable cerebral volume.  No ventriculomegaly. No acute intracranial hemorrhage identified.  No midline shift, mass effect, or evidence of mass lesion.  Negative pituitary, cervicomedullary junction and visualized cervical spine.  Normal bone marrow signal.  Numerous chronic lacunar infarcts in the deep gray matter nuclei, brainstem, cerebral white matter, and to a lesser extent cerebellum.  Confluent cerebral white matter T2 and FLAIR hyperintensity elsewhere.  Stable orbit soft tissues, paranasal sinuses, mastoids and scalp soft tissues.  IMPRESSION: 1.  Two areas of possible acute small vessel ischemia, superimposed on chronic lacunar infarcts: - Right motor strip subcortical white matter, - Right corona radiata. 2.  No mass effect or hemorrhage.  Otherwise stable underlying advanced chronic small vessel ischemia. 3.  MRA findings are below.  MRA HEAD WITHOUT CONTRAST  Technique: Angiographic images of the Circle of Willis were obtained using MRA technique without  intravenous contrast.  Findings: Stable antegrade flow in the posterior circulation supplied by a dominant and mildly dolichoectatic distal right vertebral artery.  Distal left vertebral  artery functionally terminates in PICA.  Both PICA origins are patent.  Both AICA origins are patent.  Stable mildly dolichoectatic basilar artery without stenosis.  SCA and PCA origins are stable within normal limits.  Normal left posterior communicating artery, the right is more diminutive or absent.  Bilateral  PCA branches are within normal limits.  Stable antegrade flow in both ICA siphons.  Ophthalmic and left posterior communicating artery origins are stable and within normal limits.  Carotid termini, MCA, and ACA origins remain normal.  Stable mild stenosis of the distal left ACA A1 segment.  Normal anterior communicating artery and other visualized ACA branches. Left MCA branches are stable within normal limits.  Right MCA M1 segment is stable and within normal limits.  Right MCA bifurcation remains normal.  Visualized right MCA branches are stable within normal limits.  IMPRESSION: Stable since 2013 and largely negative intracranial MRA.  There is unchanged mild stenosis of the left ACA A1 segment.   Original Report Authenticated By: Erskine Speed, M.D.     Microbiology: Recent Results (from the past 240 hour(s))  MRSA PCR SCREENING     Status: Abnormal   Collection Time    12/03/12  8:00 PM      Result Value Range Status   MRSA by PCR POSITIVE (*) NEGATIVE Final   Comment:            The GeneXpert MRSA Assay (FDA     approved for NASAL specimens     only), is one component of a     comprehensive MRSA colonization     surveillance program. It is not     intended to diagnose MRSA     infection nor to guide or     monitor treatment for     MRSA infections.     RESULT CALLED TO, READ BACK BY AND VERIFIED WITH:     CALLED TO RN [facility] ALVIAR [id number] @[phone]  THANEY     Labs: Basic Metabolic Panel:  Recent Labs Lab 12/03/12 1407 12/03/12 1421  NA 137 136  K 4.2 4.1  CL 96 99  CO2 29  --   GLUCOSE 94 95  BUN 25* 26*  CREATININE 0.58 0.70  CALCIUM 9.2  --    Liver Function  Tests:  Recent Labs Lab 12/03/12 1407  AST 16  ALT 11  ALKPHOS 60  BILITOT 0.2*  PROT 6.4  ALBUMIN 3.3*   No results found for this basename: LIPASE, AMYLASE,  in the last 168 hours No results found for this basename: AMMONIA,  in the last 168 hours CBC:  Recent Labs Lab 12/03/12 1407 12/03/12 1421 12/04/12 0820  WBC 26.9*  --  26.7*  NEUTROABS 7.8*  --   --   HGB 12.8 13.3 13.1  HCT 37.3 39.0 38.7  MCV 87.4  --  86.6  PLT 290  --  306   Cardiac Enzymes:  Recent Labs Lab 12/03/12 1945  TROPONINI <0.30   BNP: BNP (last 3 results)  Recent Labs  03/24/12 0400 10/05/12 1545 10/18/12 2010  PROBNP 2150.0* 3200.0* 4481.0*   CBG:  Recent Labs Lab 12/03/12 1413  GLUCAP 92       Signed:  Kensey Luepke, [id number]  [facility] 12/05/2012, 2:51 PM

## 2012-12-05 NOTE — Progress Notes (Signed)
Pt ready to return to [facility].  Meds returned to daughter. IV dc'd.  Waiting for transport.

## 2012-12-05 NOTE — Progress Notes (Signed)
Speech Language Pathology Dysphagia Treatment Patient Details Name: Sara [name] MRN: [id number] DOB: 1918-10-21 Today's Date: 12/05/2012 Time: 5621-3086 SLP Time Calculation (min): 15 min  Assessment / Plan / Recommendation Clinical Impression  Patient presents with a functional oropharyngeal swallow with SLP provided diagnostic po trials. No overt s/s of aspiration observed. Recommend continuation of a regular diet, thin liquids (straws ok)  with general safe swallowing precautions. No further SLP needs for dysphagia indicated at this time.     Diet Recommendation  Continue with Current Diet: Regular;Thin liquid    SLP Plan All goals met   Pertinent Vitals/Pain None reported   Swallowing Goals  SLP Swallowing Goals Patient will consume recommended diet without observed clinical signs of aspiration with: Supervision/safety Swallow Study Goal #1 - Progress: Met Patient will utilize recommended strategies during swallow to increase swallowing safety with: Supervision/safety Swallow Study Goal #2 - Progress: Met  General Temperature Spikes Noted: No Respiratory Status: Room air Behavior/Cognition: Alert;Cooperative;Pleasant mood Oral Cavity - Dentition: Adequate natural dentition Patient Positioning: Upright in bed      Dysphagia Treatment Treatment focused on: Skilled observation of diet tolerance Treatment Methods/Modalities: Skilled observation;Differential diagnosis Patient observed directly with PO's: Yes Type of PO's observed: Regular;Thin liquids Feeding: Able to feed self Liquids provided via: Straw   GO Functional Assessment Tool Used: skilled clinical judgment Functional Limitations: Swallowing Swallow Current Status ([id number]): At least 1 percent but less than 20 percent impaired, limited or restricted Swallow Goal Status [phone]): At least 1 percent but less than 20 percent impaired, limited or restricted Swallow Discharge Status [phone]): 0 percent impaired, limited  or restricted  Sara Lango MA, CCC-SLP [phone]  Sara [name] Meryl 12/05/2012, 11:41 AM

## 2012-12-05 NOTE — Progress Notes (Signed)
Stroke Team Progress Note  HISTORY Sara [name] is a 79 y.o. female with a history of cutaneous T-Cell lymphoma who presents with difficulty speaking on 12/03/2012. Her daughter states that she had been with her all morning and then she began having garbled speech, as well as possibly left facial droop. She has a history of afib, but is not anticoagulated. Her daughter notes that she felt warm earlier, but she is afebrile here. Patient was not a TPA candidate secondary to mild symptoms. She was admitted for further evaluation and treatment.  SUBJECTIVE Daughter at bedside.  OBJECTIVE Most recent Vital Signs: Filed Vitals:   12/04/12 2200 12/05/12 0200 12/05/12 0600 12/05/12 1045  BP: 127/63 142/46 155/54 133/58  Pulse: 76 71 73 68  Temp: 97.5 F (36.4 C) 97.6 F (36.4 C) 97.8 F (36.6 C) 97.8 F (36.6 C)  TempSrc: Oral Oral Oral Oral  Resp: 20 20 20 20   SpO2: 98% 97% 97% 100%   CBG (last 3)   Recent Labs  12/03/12 1413  GLUCAP 92   IV Fluid Intake:   . sodium chloride 20 mL/hr at 12/03/12 1836   MEDICATIONS  . aspirin  81 mg Oral q morning - 10a  . atorvastatin  40 mg Oral q1800  . bexarotene  75 mg Oral QAC supper  . carvedilol  25 mg Oral BID WC  . Chlorhexidine Gluconate Cloth  6 each Topical Q0600  . clopidogrel  75 mg Oral Daily  . diltiazem  120 mg Oral Daily  . docusate sodium  100 mg Oral Daily  . enoxaparin (LOVENOX) injection  40 mg Subcutaneous Q24H  . ferrous sulfate  325 mg Oral BID  . folic acid  1 mg Oral Daily  . furosemide  40 mg Oral BID  . gabapentin  100 mg Oral QHS  . hydrOXYzine  10 mg Oral QHS  . mupirocin ointment  1 application Nasal BID  . naproxen  500 mg Oral BID WC  . omega-3 acid ethyl esters  1 g Oral QHS  . polyethylene glycol  17 g Oral Daily  . protein supplement  2 scoop Oral BID   PRN:  acetaminophen, acetaminophen, albuterol, nitroGLYCERIN  Diet:  General thin liquids Activity:  OOB with assistance DVT Prophylaxis:  Lovenox  40 mg sq daily   CLINICALLY SIGNIFICANT STUDIES Basic Metabolic Panel:   Recent Labs Lab 12/03/12 1407 12/03/12 1421  NA 137 136  K 4.2 4.1  CL 96 99  CO2 29  --   GLUCOSE 94 95  BUN 25* 26*  CREATININE 0.58 0.70  CALCIUM 9.2  --    Liver Function Tests:   Recent Labs Lab 12/03/12 1407  AST 16  ALT 11  ALKPHOS 60  BILITOT 0.2*  PROT 6.4  ALBUMIN 3.3*   CBC:   Recent Labs Lab 12/03/12 1407 12/03/12 1421 12/04/12 0820  WBC 26.9*  --  26.7*  NEUTROABS 7.8*  --   --   HGB 12.8 13.3 13.1  HCT 37.3 39.0 38.7  MCV 87.4  --  86.6  PLT 290  --  306   Coagulation:   Recent Labs Lab 12/03/12 1407  LABPROT 13.6  INR 1.05   Cardiac Enzymes:   Recent Labs Lab 12/03/12 1945  TROPONINI <0.30   Urinalysis:   Recent Labs Lab 12/03/12 1406  COLORURINE YELLOW  LABSPEC 1.012  PHURINE 6.5  GLUCOSEU NEGATIVE  HGBUR NEGATIVE  BILIRUBINUR NEGATIVE  KETONESUR NEGATIVE  PROTEINUR NEGATIVE  UROBILINOGEN  0.2  NITRITE NEGATIVE  LEUKOCYTESUR NEGATIVE   Lipid Panel    Component Value Date/Time   CHOL 218* 12/04/2012 0820   TRIG 207* 12/04/2012 0820   HDL 34* 12/04/2012 0820   CHOLHDL 6.4 12/04/2012 0820   VLDL 41* 12/04/2012 0820   LDLCALC 143* 12/04/2012 0820   HgbA1C  Lab Results  Component Value Date   HGBA1C 5.7* 12/04/2012    Urine Drug Screen:     Component Value Date/Time   LABOPIA NONE DETECTED 12/03/2012 1406   COCAINSCRNUR NONE DETECTED 12/03/2012 1406   LABBENZ NONE DETECTED 12/03/2012 1406   AMPHETMU NONE DETECTED 12/03/2012 1406   THCU NONE DETECTED 12/03/2012 1406   LABBARB NONE DETECTED 12/03/2012 1406    Alcohol Level: No results found for this basename: ETH,  in the last 168 hours  CT of the brain  12/03/2012  1. Negative for bleed or other acute intracranial process.  2. Atrophy and nonspecific white matter changes.  MRI of the brain  12/04/2012  1.  Two areas of possible acute small vessel ischemia, superimposed on chronic lacunar infarcts:  - Right motor strip subcortical white matter, - Right corona radiata. 2.  No mass effect or hemorrhage.  Otherwise stable underlying advanced chronic small vessel ischemia.  MRA of the brain 12/04/2012   Stable since 2013 and largely negative intracranial MRA.  There is unchanged mild stenosis of the left ACA A1 segment.   2D Echocardiogram    Carotid Doppler  Bilateral: No evidence of hemodynamically significant internal carotid artery stenosis. Vertebral artery flow is antegrade  CXR  12/03/2012  No acute cardiopulmonary findings.  EKG  normal sinus rhythm.   Therapy Recommendations SNF  Physical Exam   Pleasant elderly lady currently not in distress.Awake alert. Afebrile. Head is nontraumatic. Neck is supple without bruit. Hearing is normal. Cardiac exam no murmur or gallop. Lungs are clear to auscultation. Distal pulses are well in left leg only. 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 Extraocular movements are full range with mild right gaze preference. Slight decreased blink to threat on the left compared to the right. Mild left lower face asymmetry. Tongue midline. Mild left   lower extremity drift. Mild diminished fine finger movements on left. Orbits right over left upper extremity. Mild left grip weak.. Diminished left body sensation . Normal coordination.  ASSESSMENT [name] is a 79 y.o. female presenting with sudden onset dysarthria. Imaging confirms a right motor strip subcortical white matter and right corona radiata infarct. Infarcts felt to be embolic secondary to known atrial fibrillation.  On aspirin 81 mg orally every day prior to admission. Now on aspirin 81 mg orally every day and clopidogrel 75 mg orally every day for secondary stroke prevention. Patient with resultant dysarthria. Work up underway.   Phantom limb pain, resumed neurontin at 100  mg tid  Mycosis fungoides, cutaneous lymphoma atrial fibrillation Hypertension Hyperlipidemia, LDL 143, on statin PTA, on statin now, goal LDL < 100 Hx carotid stent, placed by Dr Allyson Sabal, open per carotid doppler Hx stroke CAD - MI Non healing wound right leg, necrotic  HgbA1c 5.7  Hospital day # 2  TREATMENT/PLAN  Change to  pradaxa for secondary stroke prevention. No need for aspirin in addition from the stroke perspective, however, given hx stent, recommend asking Dr. Allyson Sabal about need to continue. Will discontinue Plavix.  F/u 2D  D/w Dr Seward Carol, MSN, RN, ANVP-BC, ANP-BC, GNP-BC [facility] Pager: [phone] 12/05/2012 12:23 PM  I have personally obtained a history, examined the patient, evaluated imaging results, and formulated the assessment and plan of care. I agree with the above.  Delia Heady, MD

## 2012-12-05 NOTE — Clinical Social Work Note (Signed)
Clinical Social Work   Pt is ready for discharge to [facility]. Facility has received discharge summary and is ready to admit pt. Pt and daughter are agreeable to discharge plan. [facility] will provide transportation. CSW is signing off as no further needs identified.   Dede Query, MSW, LCSW [phone]

## 2012-12-05 NOTE — Progress Notes (Signed)
Order received, chart reviewed, noted that patient is from SNF and hopes to D/C back there today if medically ready. Will defer OT eval to that facility as them deem appropriate. Acute OT will sign off.  Ignacia Palma, [place] [phone] 12/05/2012

## 2012-12-05 NOTE — Clinical Social Work Note (Signed)
Clinical Social Work   CSW called admissions Nurse, adult at [facility] at [place], as pt is potentially ready for discharge today. Per facility, pt will be able to return today, if medically ready. FL2 is on chart for MD signature. CSW will continue to follow to facilitate discharge.   Dede Query, MSW, LCSW [phone]

## 2012-12-06 ENCOUNTER — Encounter: Payer: Self-pay | Admitting: Nurse Practitioner

## 2012-12-06 ENCOUNTER — Non-Acute Institutional Stay (SKILLED_NURSING_FACILITY): Payer: Medicare Other | Admitting: Nurse Practitioner

## 2012-12-06 DIAGNOSIS — I480 Paroxysmal atrial fibrillation: Secondary | ICD-10-CM

## 2012-12-06 DIAGNOSIS — G459 Transient cerebral ischemic attack, unspecified: Secondary | ICD-10-CM

## 2012-12-06 DIAGNOSIS — I10 Essential (primary) hypertension: Secondary | ICD-10-CM

## 2012-12-06 DIAGNOSIS — I5033 Acute on chronic diastolic (congestive) heart failure: Secondary | ICD-10-CM

## 2012-12-06 DIAGNOSIS — I4891 Unspecified atrial fibrillation: Secondary | ICD-10-CM

## 2012-12-06 DIAGNOSIS — J9600 Acute respiratory failure, unspecified whether with hypoxia or hypercapnia: Secondary | ICD-10-CM

## 2012-12-06 DIAGNOSIS — I6350 Cerebral infarction due to unspecified occlusion or stenosis of unspecified cerebral artery: Secondary | ICD-10-CM

## 2012-12-06 DIAGNOSIS — I639 Cerebral infarction, unspecified: Secondary | ICD-10-CM

## 2012-12-06 DIAGNOSIS — D649 Anemia, unspecified: Secondary | ICD-10-CM

## 2012-12-06 DIAGNOSIS — D72829 Elevated white blood cell count, unspecified: Secondary | ICD-10-CM

## 2012-12-06 DIAGNOSIS — I6340 Cerebral infarction due to embolism of unspecified cerebral artery: Secondary | ICD-10-CM

## 2012-12-06 DIAGNOSIS — I509 Heart failure, unspecified: Secondary | ICD-10-CM

## 2012-12-06 DIAGNOSIS — I739 Peripheral vascular disease, unspecified: Secondary | ICD-10-CM

## 2012-12-06 NOTE — Progress Notes (Signed)
Subjective:    Patient ID: Sara [name], female    DOB: 07/04/1919, 79 y.o.   MRN: [id number]  HPI    Readmitted to SNF 12/05/12 for acute CVA(new R cerebral infarction-MRI brain 12/04/12 two areas of possible acute small vessel ischemia, MRA of the brain 12/04/12 largely negative intracranial MRA)-Dr. Pearlean Brownie neurology and Dr. Lauralyn Primes placed her R ICA stent)  Cardiolgy both recommended Pradaxa for coagulation due to hx of Afib and multiple CVAs . Hx of CVAs x2 in 2006.     Afib: HR was up to 130s while in hospital--normalized after Coreg up to 25mg  bid from 12.5mg  bid while Cardizem 120mg  continued. EKG showed normal SR    CHF EF 60-65%, compensated on Furosemide 40mg  bid. BNP 4481 2/51/4 Readmitted to SNF 10/21/12 for acute on chronic CHF, BNP 4000, Furosemide was increased to 40mg  bid and r/o PE per angio chest      Hospitalized 03/20/12--03/24/12. Presented to ER with SOB for 3 days and Sat O2 in 80s improved on IV NTG and Furosemide in ER and HCAP: treated with ABT A-fib: rated controlled on Coreg and Cardizem.  202.80-LYMPHOMA, MALIGNANT  leukocytosis chronic, wbc count 20s, treated with Targretin. F/u Dr. Jeanella Cara 263.0-MALNUTRITION OF MODERATE DEGREE  Supplement started, appetite improving. 272.4-HYPERLIPIDEMIA  stable. Takes Fish oil and Atorvastatin  285.9-ANEMIA   Normocytic, normochromic. Taking Iron and folic acid, Hgb 12s.  288.8-LEUKOCYTOSIS  Chronic leukocytosis, no change. while count 20s. Hx of T-cell lymphoma/mycosis  fungoides--started Targretin recently.  356.9-NEUROPATHY, PERIPHERAL  takes Neurontin 100mg   401.9-HTN UNSPECIFIED  controlled on Lisinopril,  Coreg, Cardizem 414.01-CAD  risk reduction with statin, prn NTG 427.31-AFIB  AF: , Rate controlled on Diltiazem, Carvedilol. 428.0-CONGESTIVE HEART FAILURE  probable exacerbation of diastolic heart failure,  Po Furosemide. 2 D echocardiogram: 03/20/12 LVEF 63% and grade 2 diastolic dysfunction. Pro BNP 3000s while in hospital,  still has c/o cough/SOB when she is lying in bed at night 443.9-PVD  Doppler study suggested arterial occlusive disease in both legs, s/p AKA of the right.  530.11-GERD The patient's dyspeptic symptoms remain stable.off PPI 564.00-CONSTIPATION The symptoms are stable.taking Colace and Miralax.  696.1-PSORIASIS  chronic. To See dermatologist. Using Hylatopic foam and Triamcinolone prn to affected areas. 799.3-DEBILITY, UNSPECIFIED  due to recent AKA and UTI.      And new CVA Review of Systems  Constitutional: Positive for appetite change and fatigue.  HENT: Positive for hearing loss. Negative for ear pain, nosebleeds, congestion, facial swelling, rhinorrhea, mouth sores, neck pain, neck stiffness, voice change, sinus pressure and ear discharge.   Eyes: Positive for photophobia.  Respiratory: Positive for shortness of breath. Negative for apnea, cough, choking, chest tightness and wheezing.   Cardiovascular: Negative.   Gastrointestinal: Negative for abdominal pain, diarrhea, constipation and abdominal distention.  Genitourinary: Negative for dysuria, urgency, frequency, flank pain and pelvic pain.  Musculoskeletal: Positive for myalgias, arthralgias and gait problem.  Skin: Positive for rash (red scaly allover-hx of psoriasis).  Hematological: Positive for adenopathy (axillary and upper arms). Bruises/bleeds easily.  Psychiatric/Behavioral: Positive for confusion.       Objective:   Physical Exam  Constitutional: She is oriented to person, place, and time. She appears well-developed and well-nourished. No distress.  HENT:  Head: Normocephalic.  Right Ear: External ear normal.  Left Ear: External ear normal.  Nose: Nose normal.  Mouth/Throat: Oropharynx is clear and moist. No oropharyngeal exudate.  Eyes: EOM are normal. Pupils are equal, round, and reactive to light. Right conjunctiva is injected.  Only a few eyelashes remained and right conjunctiva mild irritated with bilateral lower  eyelid eversion.   Neck: Normal range of motion. Neck supple. JVD present. No thyromegaly present.  Cardiovascular: Normal rate, regular rhythm and normal heart sounds.  Exam reveals decreased pulses.   No murmur heard. Pulses:      Dorsalis pedis pulses are 1+ on the left side.       Posterior tibial pulses are 1+ on the left side.  Pulmonary/Chest: Effort normal and breath sounds normal. No respiratory distress. She has no wheezes. She exhibits no tenderness.  Abdominal: Soft. Bowel sounds are normal. She exhibits no distension. There is no tenderness.  Musculoskeletal: Normal range of motion. She exhibits no edema and no tenderness.  R AKA  Lymphadenopathy:    She has no cervical adenopathy.  Neurological: She is alert and oriented to person, place, and time. She has normal reflexes. She displays normal reflexes. No cranial nerve deficit. She exhibits normal muscle tone. Coordination normal.  Skin: Rash noted. There is erythema.     Excoriated buttocks.  Psychiatric: She has a normal mood and affect. Her behavior is normal. Judgment and thought content normal.        Assessment & Plan:   Leukocytosis Chronic   . Hypertension Controlled.   . Paroxysmal atrial fibrillation, NSR on adm 03/20/12, poor Coumadin candidate secondary to falls, now on Pradaxa due to multiple CVAs and s/p L carotid stent placement  . Chronic diastolic HF (heart failure) BNP 4481 10/18/12, continue Furosemide 40mg  bid. Update BMP  . Anemia Hgb 12s, continue Fe and Folic acid.  Update CBC  . Stroke, acute, Rt brain, embolic March 2013 -no apparent paralysis and aphasia seen  . PVD, Lt CA stent 04/02/11  . 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 Atrial fibrillation with RVR-SR per EKG upon discharge.   Marland Kitchen TIA (transient ischemic attack) Hx   . HTN (hypertension) Controlled.   Marland Kitchen PAF (paroxysmal atrial fibrillation) SR  . CVA (cerebral infarction) New, Rehab,  Paradexa    Conjunctivitis: R erythromycin ophthalmic oint 1cm ribbon to the right lower conjunctival sac nightly.

## 2012-12-14 LAB — BASIC METABOLIC PANEL
BUN: 20 mg/dL (ref 4–21)
Creatinine: 0.5 mg/dL (ref 0.5–1.1)
Glucose: 94 mg/dL
Potassium: 4.2 mmol/L (ref 3.4–5.3)

## 2012-12-14 LAB — HEPATIC FUNCTION PANEL
ALT: 10 U/L (ref 7–35)
AST: 13 U/L (ref 13–35)

## 2012-12-14 LAB — CBC AND DIFFERENTIAL: WBC: 26.8 10^3/mL

## 2012-12-19 ENCOUNTER — Ambulatory Visit (HOSPITAL_BASED_OUTPATIENT_CLINIC_OR_DEPARTMENT_OTHER): Payer: Medicare Other | Admitting: Oncology

## 2012-12-19 VITALS — BP 159/69 | HR 66 | Temp 97.4°F | Resp 20 | Ht 66.0 in | Wt 124.0 lb

## 2012-12-19 DIAGNOSIS — I4891 Unspecified atrial fibrillation: Secondary | ICD-10-CM

## 2012-12-19 DIAGNOSIS — C8404 Mycosis fungoides, lymph nodes of axilla and upper limb: Secondary | ICD-10-CM

## 2012-12-19 DIAGNOSIS — I509 Heart failure, unspecified: Secondary | ICD-10-CM

## 2012-12-19 DIAGNOSIS — L408 Other psoriasis: Secondary | ICD-10-CM

## 2012-12-19 NOTE — Progress Notes (Signed)
Hematology and Oncology Follow Up Visit  LACHAE HOHLER [id number] [birth year] 79 y.o. 12/19/2012 8:07 PM   Principle Diagnosis: Encounter Diagnosis  Name Primary?  . 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 After much discussion, and referral to a local expert at the [facility], I elected to start her on a trial of low-dose Targretin with the plan to give concomitant UV light treatments with the assistance of [facility] dermatology. She did start the Targretin in January. Unfortunately, she has suffered a number of major medical setbacks since that time. She had a nonhealing, chronic wound, on her right ankle and was closely followed by orthopedic surgery. She had a syncopal episode and was hospitalized on 10/05/2012. Etiology of the syncope was not clear and symptoms resolved. However while hospitalized, she was reevaluated for her ankle wound by her orthopedist. She eventually had to have a right above the knee amputation for findings of osteomyelitis. She had another admission for decompensated congestive heart failure in February. She likely suffered a stroke at that time. This was felt to be secondary to known atrial fibrillation. She was already on aspirin. Plavix initially added but neurologist recommended stopping the Plavix and putting her on Pradaxa. Full dose Pradaxa 150 mg twice daily started around 12/05/2012.  She has now recovered from these medical insults.  She really cannot tell a big difference in her skin condition since being on the Targretin. Family does think that there has been some improvement. There is more diffuse erythema and less desquamation particularly on the palms of her hands. She has tolerated the drug well with no obvious side effects. Lab monitoring for suppression of thyroid  function, bone marrow function, elevation of lipids, and liver function abnormalities has been unremarkable.  She is getting quite depressed. She really expressed that she wanted to stop all treatments. She never voiced this in public before and her 2 daughters who accompanied her today were quite upset.  Medications: reviewed  Allergies:  Allergies  Allergen Reactions  . Penicillins Other (See Comments)    unknown  . Triamterene Other (See Comments)    unknown  . Alendronate Sodium Rash    Pt stated oh k to use biotene    Review of Systems: Constitutional:   Chronic fatigue Respiratory: No cough or dyspnea Cardiovascular:  No chest pain pressure or palpitations Gastrointestinal: No abdominal pain Genito-Urinary: Not questioned Musculoskeletal: Chronic arthritis pain Neurologic: See above Skin: See above Remaining ROS negative.  Physical Exam: Blood pressure 159/69, pulse 66, temperature 97.4 F (36.3 C), temperature source Oral, resp. rate 20, height 5\' 6"  (1.676 m), weight 124 lb (56.246 kg). Wt Readings from Last 3 Encounters:  12/19/12 124 lb (56.246 kg)  12/06/12 119 lb 4.8 oz (54.114 kg)  11/02/12 133 lb 9.6 oz (60.6 kg)     General appearance: Frail, elderly woman HENNT: Pharynx no erythema or exudate Lymph nodes: Persistent 3 cm lymph node palpable right axilla no other areas of adenopathy Breasts: Lungs: Clear to auscultation resonant to percussion Heart: Regular rhythm no murmur or gallop Abdomen: Soft, nontender Extremities: Note a right above-the-knee amputation. 1+ edema left lower extremity Vascular: No cyanosis Neurologic: She is alert and oriented, PERRLA, cranial nerves grossly normal, motor strength 5 over 5 upper extremities and left lower extremity  Skin: Diffuse erythema arms, and trunk.  Resolved advanced desquamation palms of her hands. Left arm appears almost normal. Diffuse erythema right arm especially proximally.  Lab Results: Lab Results   Component Value Date   WBC 26.7* 12/04/2012   HGB 13.1 12/04/2012   HCT 38.7 12/04/2012   MCV 86.6 12/04/2012   PLT 306 12/04/2012     Chemistry      Component Value Date/Time   NA 136 12/03/2012 1421   NA 136 09/25/2012 1002   K 4.1 12/03/2012 1421   K 4.2 09/25/2012 1002   CL 99 12/03/2012 1421   CL 102 09/25/2012 1002   CO2 29 12/03/2012 1407   CO2 28 09/25/2012 1002   BUN 26* 12/03/2012 1421   BUN 17.0 09/25/2012 1002   CREATININE 0.70 12/03/2012 1421   CREATININE 0.9 09/25/2012 1002      Component Value Date/Time   CALCIUM 9.2 12/03/2012 1407   CALCIUM 9.4 09/25/2012 1002   ALKPHOS 60 12/03/2012 1407   ALKPHOS 64 09/25/2012 1002   AST 16 12/03/2012 1407   AST 14 09/25/2012 1002   ALT 11 12/03/2012 1407   ALT 13 09/25/2012 1002   BILITOT 0.2* 12/03/2012 1407   BILITOT 0.30 09/25/2012 1002    Additional lab done 12/16/2011: Hemoglobin 12, white count 26,800, platelets 339,000, BUN 20, creatinine 0.5, liver functions all normal, triglycerides 225, free T4 0.8 (0.8-1.8)     Impression and Plan: #1. Mycosis fungoides/cutaneous T-cell lymphoma Minimal response to single agent low-dose Targretin. In view of her new handicaps it will be increasingly difficult for her to comply with UV radiation. As noted above, she is becoming quite depressed with her situation and was ready to stop all treatment. However after discussion with her and her 2 daughters, she has agreed to stay on the Targretin for another month. I'm going to increase the dose up to 150 mg daily. Continue to monitor for known side effects. Continue symptomatic treatment of her chronic dermatitis. I am going to add Pepcid as a nonsedating antihistamine to help with the itching as well as topical Benadryl lotion. She is still taking Atarax on a when necessary basis.   #2. Long-standing psoriasis delaying diagnosis of mycosis fungoides  .  #3. Nonhealing wound right lateral malleolus with development of osteomyelitis requiring recent  right AKA..   #4. Chronic atrial fibrillation with recent embolic stroke. She was on aspirin. Pradaxa was started. In view of her advanced age, I am decreasing the dose to 75 mg twice daily.  #5. Essential hypertension   #6. Coronary artery disease with previous MI; Chronic congestive heart failure with recent decompensation requiring hospitalization in November 2013     CC:Marland Kitchen    Levert Feinstein, MD 4/8/20148:07 PM

## 2012-12-19 NOTE — Patient Instructions (Signed)
Increase Targetin 75 mg capsule to twice a day Begin Pepcid  40 mg daily Decrease Pradaxa to 75 mg twice daily Use topical benadryl lotion as needed for localized areas of itching Have Friend's home draw blood sample on 4/29 Follow up visit at Dr Patsy Lager office on 5/6 at 3:15 PM

## 2012-12-20 ENCOUNTER — Telehealth: Payer: Self-pay | Admitting: *Deleted

## 2012-12-20 ENCOUNTER — Telehealth: Payer: Self-pay | Admitting: Oncology

## 2012-12-20 NOTE — Telephone Encounter (Signed)
Script faxed to d/c topical benadryl lotion

## 2012-12-20 NOTE — Telephone Encounter (Signed)
S/w pt's dtr re appt for  5/6. Per 4/8 pof pt will have labs drawn at [facility] and was given a script.

## 2012-12-26 ENCOUNTER — Telehealth: Payer: Self-pay | Admitting: Dietician

## 2013-01-01 ENCOUNTER — Non-Acute Institutional Stay (SKILLED_NURSING_FACILITY): Payer: Medicare Other | Admitting: Nurse Practitioner

## 2013-01-01 DIAGNOSIS — L409 Psoriasis, unspecified: Secondary | ICD-10-CM

## 2013-01-01 DIAGNOSIS — I739 Peripheral vascular disease, unspecified: Secondary | ICD-10-CM

## 2013-01-01 DIAGNOSIS — I10 Essential (primary) hypertension: Secondary | ICD-10-CM

## 2013-01-01 DIAGNOSIS — K5900 Constipation, unspecified: Secondary | ICD-10-CM | POA: Insufficient documentation

## 2013-01-01 DIAGNOSIS — I5032 Chronic diastolic (congestive) heart failure: Secondary | ICD-10-CM

## 2013-01-01 DIAGNOSIS — L408 Other psoriasis: Secondary | ICD-10-CM

## 2013-01-01 DIAGNOSIS — D649 Anemia, unspecified: Secondary | ICD-10-CM

## 2013-01-01 DIAGNOSIS — G547 Phantom limb syndrome without pain: Secondary | ICD-10-CM

## 2013-01-01 DIAGNOSIS — G546 Phantom limb syndrome with pain: Secondary | ICD-10-CM

## 2013-01-01 DIAGNOSIS — C8404 Mycosis fungoides, lymph nodes of axilla and upper limb: Secondary | ICD-10-CM

## 2013-01-01 DIAGNOSIS — I480 Paroxysmal atrial fibrillation: Secondary | ICD-10-CM

## 2013-01-01 DIAGNOSIS — I4891 Unspecified atrial fibrillation: Secondary | ICD-10-CM

## 2013-01-01 NOTE — Assessment & Plan Note (Signed)
Risk reduction with Atorvastatin 40mg  and Fish oil

## 2013-01-01 NOTE — Assessment & Plan Note (Addendum)
Takes Fe 325mg  bid and Folic acid 1mg  Hgb 12.0 12/14/12

## 2013-01-01 NOTE — Assessment & Plan Note (Signed)
Presented heart rate controlled on Cardizem 120mg, takes Pradaxa 150mg bid     

## 2013-01-01 NOTE — Assessment & Plan Note (Signed)
F/u Dr. Granfortuna. Currently takes Targretin 75mg daily ac supper.    

## 2013-01-01 NOTE — Assessment & Plan Note (Signed)
Managed with MiraLax daily 

## 2013-01-01 NOTE — Assessment & Plan Note (Signed)
Takes Furosemide 40mg bid, Carvedilol 25mg bid, clinically compensated.    

## 2013-01-01 NOTE — Assessment & Plan Note (Signed)
Diffused skin changes allover her body, f/u Dermatology, takes Hydroxyzine nightly for itching and rest.

## 2013-01-01 NOTE — Assessment & Plan Note (Signed)
Right AKA--managed with Gabapentin 100mg nightly 

## 2013-01-01 NOTE — Assessment & Plan Note (Signed)
Controlled on Lisinopril 5mg  along with Cardizem and lasix

## 2013-01-01 NOTE — Progress Notes (Signed)
Patient ID: Sara [name], female   DOB: 29-Jan-1919, 79 y.o.   MRN: [id number]  Chief Complaint:  Chief Complaint  Patient presents with  . 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 Cyndie Chime. Currently takes Targretin 75mg  daily ac supper.     Unspecified constipation     Managed with MiraLax daily    Phantom limb pain     Right AKA--managed with Gabapentin 100mg  nightly.     RESOLVED: Psoriasis     Diffused skin changes allover her body, f/u Dermatology, takes Hydroxyzine nightly for itching and rest.     Chronic diastolic HF (heart failure) - Primary (Chronic)     Takes Furosemide 40mg  bid, Carvedilol 25mg  bid, clinically compensated.     PVD, Lt CA stent 04/02/11 (Chronic)     Risk reduction with Atorvastatin 40mg  and Fish oil       Review of Systems:  Review of Systems  Constitutional: Negative for fever, chills, weight loss, malaise/fatigue and diaphoresis.  HENT: Positive for hearing loss. Negative for ear pain, congestion, sore throat, neck pain and ear discharge.   Eyes: Negative for pain, discharge and redness.  Respiratory: Negative for cough, sputum production, shortness of breath and wheezing.   Cardiovascular: Negative for chest pain, palpitations, orthopnea, claudication, leg swelling and PND.  Gastrointestinal: Negative for heartburn, nausea, vomiting, abdominal pain, diarrhea, constipation and blood in stool.  Genitourinary: Positive for frequency (incontinent of  bladder). Negative for dysuria, urgency, hematuria and flank pain.  Musculoskeletal: Positive for joint pain. Negative for myalgias, back pain and falls.  Skin: Positive for itching and rash.  Neurological: Positive for weakness (generalized). Negative for dizziness, tingling, tremors, sensory change, speech change, focal weakness, seizures, loss of consciousness and headaches.  Endo/Heme/Allergies: Negative for environmental allergies and polydipsia. Bruises/bleeds easily.  Psychiatric/Behavioral: Positive for memory loss. Negative for depression and hallucinations. The patient is not nervous/anxious and does not have insomnia.      Medications: Reviewed at [facility]   Physical Exam: Physical Exam  Constitutional: She is oriented to person, place, and time. She appears well-developed and well-nourished. No distress.  HENT:  Head: Normocephalic.  Right Ear: External ear normal.  Left Ear: External ear normal.  Nose: Nose normal.  Mouth/Throat: Oropharynx is clear and moist. No oropharyngeal exudate.  Eyes: EOM are normal. Pupils are equal, round, and reactive to light. Right conjunctiva is injected.  Only a few eyelashes remained and right conjunctiva mild irritated with bilateral lower eyelid eversion.   Neck: Normal range of motion. Neck supple. JVD present. No thyromegaly present.  Cardiovascular: Normal rate, regular rhythm and normal heart sounds.  Exam reveals decreased pulses.   No murmur heard. Pulses:      Dorsalis pedis pulses are 1+ on the left side.       Posterior tibial pulses are 1+ on the left side.  Pulmonary/Chest: Effort normal and breath  sounds normal. No respiratory distress. She has no wheezes. She exhibits no tenderness.  Abdominal: Soft. Bowel sounds are normal. She exhibits no distension. There is no tenderness.  Genitourinary:  Incontinent of bladder  Musculoskeletal: Normal range of motion. She exhibits no edema and no tenderness.  R AKA  Lymphadenopathy:     She has no cervical adenopathy.  Neurological: She is alert and oriented to person, place, and time. She has normal reflexes. She displays normal reflexes. No cranial nerve deficit. She exhibits normal muscle tone. Coordination normal.  Skin: Rash noted. There is erythema.     Excoriated buttocks-on and off since the patient is incontinent of her bladder at times and sitting for longer period of time since she is OOB more than prior.   Psychiatric: She has a normal mood and affect. Her behavior is normal. Judgment and thought content normal. She exhibits abnormal recent memory.     Filed Vitals:   01/01/13 1000  BP: 119/75  Pulse: 66  Temp: 97.3 F (36.3 C)  TempSrc: Tympanic  Resp: 16      Labs reviewed: Basic Metabolic Panel:  Recent Labs  16/10/96 1521 07/22/12 2318 07/23/12 0645  11/02/12 0530  11/03/12 0640 12/03/12 1407 12/03/12 1421 12/14/12  NA 139 142 143  < > 131*  --  132* 137 136 138  K 4.5 4.2 3.6  < > 4.1  --  4.1 4.2 4.1 4.2  CL 106 105 108  < > 93*  --  94* 96 99  --   CO2 29  --  27  < > 27  --  29 29  --   --   GLUCOSE 122* 126* 97  < > 93  --  95 94 95  --   BUN 27.0* 25* 20  < > 22  --  19 25* 26* 20  CREATININE 1.1 1.10 0.87  < > 0.48*  < > 0.50 0.58 0.70 0.5  CALCIUM 9.8  --  9.2  < > 9.5  --  9.0 9.2  --   --   MG  --   --  2.1  --   --   --   --   --   --   --   PHOS  --   --  3.5  --   --   --   --   --   --   --   TSH 1.892  --  1.787  --  1.519  --   --   --   --   --   < > = values in this interval not displayed.  Liver Function Tests:  Recent Labs  10/18/12 1929 11/02/12 0530 12/03/12 1407 12/14/12  AST 28 13 16 13   ALT 16 8 11 10   ALKPHOS 48 78 60 52  BILITOT 0.2* 0.2* 0.2*  --   PROT 5.9* 5.8* 6.4  --   ALBUMIN 2.3* 2.6* 3.3*  --     CBC:  Recent Labs  11/01/12 2027 11/02/12 0530  11/03/12 0640 12/03/12 1407 12/03/12 1421 12/04/12 0820 12/14/12  WBC 23.0* 25.5*  < > 20.3* 26.9*  --  26.7* 26.8  NEUTROABS 6.7 9.2*   --   --  7.8*  --   --   --   HGB 12.8 11.8*  < > 11.7* 12.8 13.3 13.1 12.0  HCT 37.6 35.1*  < > 34.8* 37.3 39.0 38.7 36  MCV 86.4 86.2  < > 86.8  87.4  --  86.6  --   PLT 328 323  < > 296 290  --  306 339  < > = values in this interval not displayed.  Anemia Panel: No results found for this basename: IRON, FOLATE, VITAMINB12,  in the last 8760 hours  Significant Diagnostic Results:     Assessment/Plan Chronic diastolic HF (heart failure) Takes Furosemide 40mg  bid, Carvedilol 25mg  bid, clinically compensated.   Paroxysmal atrial fibrillation, NSR on adm 03/20/12, poor Coumadin candidate secondary to falls Presented heart rate controlled on Cardizem 120mg , takes Pradaxa 150mg  bid  PVD, Lt CA stent 04/02/11 Risk reduction with Atorvastatin 40mg  and Fish oil  Hypertension, uncontrolled on adm 03/20/12 Controlled on Lisinopril 5mg  along with Cardizem and lasix  Anemia Takes Fe 325mg  bid and Folic acid 1mg  Hgb 12.0 12/14/12  Unspecified constipation Managed with MiraLax daily  Psoriasis Diffused skin changes allover her body, f/u Dermatology, takes Hydroxyzine nightly for itching and rest.   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 Cyndie Chime. Currently takes Targretin 75mg  daily ac supper.   Phantom limb pain Right AKA--managed with Gabapentin 100mg  nightly.       Family/ staff Communication: skin care.    Goals of care: SNF for care   Labs/tests ordered none

## 2013-01-05 ENCOUNTER — Encounter: Payer: Self-pay | Admitting: Oncology

## 2013-01-16 ENCOUNTER — Telehealth: Payer: Self-pay | Admitting: Oncology

## 2013-01-16 ENCOUNTER — Ambulatory Visit (HOSPITAL_BASED_OUTPATIENT_CLINIC_OR_DEPARTMENT_OTHER): Payer: Medicare Other | Admitting: Nurse Practitioner

## 2013-01-16 ENCOUNTER — Other Ambulatory Visit (HOSPITAL_COMMUNITY): Payer: Self-pay | Admitting: Cardiology

## 2013-01-16 VITALS — BP 166/71 | HR 66 | Temp 98.1°F | Resp 20 | Ht 66.0 in | Wt 131.9 lb

## 2013-01-16 DIAGNOSIS — C8404 Mycosis fungoides, lymph nodes of axilla and upper limb: Secondary | ICD-10-CM

## 2013-01-16 DIAGNOSIS — G458 Other transient cerebral ischemic attacks and related syndromes: Secondary | ICD-10-CM

## 2013-01-16 NOTE — Telephone Encounter (Signed)
Gave pt appt for July and June 2014

## 2013-01-16 NOTE — Progress Notes (Signed)
OFFICE PROGRESS NOTE  Interval history:  Sara [name] is a 79 year old woman diagnosed with cutaneous T-cell lymphoma, mycosis fungoides, July 2013. She began a trial of low-dose Targretin in January of this year. The dose was escalated to 150 mg daily following an office visit on 12/19/2012.  She is seen today for scheduled followup. She has noted improvement in the skin on her hands and arms. She notes cyclic peeling of the palms. Pruritus is significantly better. No nausea or vomiting. No mouth sores. No diarrhea. She is being fitted for a prosthetic device for the right leg.   Objective: Blood pressure 166/71, pulse 66, temperature 98.1 F (36.7 C), temperature source Oral, resp. rate 20, height 5\' 6"  (1.676 m), weight 131 lb 14.4 oz (59.829 kg).  Oropharynx is without thrush or ulceration. No palpable cervical or supraclavicular lymph nodes. Approximate 3 cm lymph node in the right axilla; approximate 1 cm lymph node low left axilla/chest wall. Lungs are clear. Regular cardiac rhythm. Abdomen soft and nontender. Right AKA. Left leg is without edema. Marked erythema over the back. Desquamation of the palms. Erythema over the dorsal aspect of both hands extending to the forearms.  Lab Results: Lab Results  Component Value Date   WBC 26.8 12/14/2012   HGB 12.0 12/14/2012   HCT 36 12/14/2012   MCV 86.6 12/04/2012   PLT 339 12/14/2012    Chemistry:    Chemistry      Component Value Date/Time   NA 138 12/14/2012   NA 136 12/03/2012 1421   NA 136 09/25/2012 1002   K 4.2 12/14/2012   K 4.2 09/25/2012 1002   CL 99 12/03/2012 1421   CL 102 09/25/2012 1002   CO2 29 12/03/2012 1407   CO2 28 09/25/2012 1002   BUN 20 12/14/2012   BUN 26* 12/03/2012 1421   BUN 17.0 09/25/2012 1002   CREATININE 0.5 12/14/2012   CREATININE 0.70 12/03/2012 1421   CREATININE 0.9 09/25/2012 1002   GLU 94 12/14/2012      Component Value Date/Time   CALCIUM 9.2 12/03/2012 1407   CALCIUM 9.4 09/25/2012 1002   ALKPHOS 52 12/14/2012   ALKPHOS 64 09/25/2012 1002   AST 13 12/14/2012   AST 14 09/25/2012 1002   ALT 10 12/14/2012   ALT 13 09/25/2012 1002   BILITOT 0.2* 12/03/2012 1407   BILITOT 0.30 09/25/2012 1002       Studies/Results: No results found.  Medications: I have reviewed the patient's current medications.  Assessment/Plan:  1. Mycosis fungoides/cutaneous T-cell lymphoma currently on Targretin 150 mg daily.  2. Long-standing psoriasis.  3. Nonhealing wound right lateral malleolus with development of osteomyelitis requiring recent right AKA. 4. Chronic atrial fibrillation with recent embolic stroke. 5. Hypertension. 6. Coronary artery disease with previous MI; chronic congestive heart failure.  Disposition-Sara [name] appears stable. She feels her skin has improved since increasing the dose of Targretin. She appears to be tolerating the Targretin well. Plan to continue at the current dose for now. We will reevaluate in one month. She will contact the office in the interim with any problems.  Patient seen with Dr. Cyndie Chime.  Lonna Cobb ANP/GNP-BC

## 2013-01-18 ENCOUNTER — Telehealth: Payer: Self-pay | Admitting: *Deleted

## 2013-01-18 NOTE — Telephone Encounter (Signed)
Pt's daughter, Sheran Spine called & states that mother wants to know if she could be under Hospice Care while on the targretin.  She states this will help her make a decision about moving back into her apartment.  She states that her treatment is palliative.  She would like a call back @ [phone] & states DO NOT LEAVE A MESSAGE.  She states that we can also reach her husband, Jacklynn Ganong @ [phone].  Called & left message with Referral Dept who will discuss with their MD & call back tomorrow.  Note to Dr Cyndie Chime to see if this would be an appropriate referral.

## 2013-01-19 ENCOUNTER — Telehealth: Payer: Self-pay | Admitting: *Deleted

## 2013-01-19 NOTE — Telephone Encounter (Signed)
Received call from Kathy/Hospice/[place] reporting that Dr. Jamie Brookes & Dr. Cyndie Chime talked & Dr. Cyndie Chime would like to see pt again to assess if targretin is helping.  She states that if targretin is working she would not be eligible for hospice.  Notified daughter, Sheran Spine of this information & she states this does help her.

## 2013-01-22 ENCOUNTER — Telehealth: Payer: Self-pay | Admitting: *Deleted

## 2013-01-22 NOTE — Telephone Encounter (Signed)
Spoke with dtr.  Let her know that Hospice will not take her while she is on Targretin.  Dr. Cyndie Chime spoke with Dr. Jamie Brookes.   If there is not sign of response to Targretin by next visit, which is 6/9, then he will stop it and will do Hospice referral then if patient wants.   Dtr. Sheran Spine, appreciated the call and said this would help them plan.

## 2013-01-23 ENCOUNTER — Other Ambulatory Visit: Payer: Self-pay | Admitting: Cardiovascular Disease

## 2013-01-23 LAB — COMPREHENSIVE METABOLIC PANEL
AST: 14 U/L (ref 0–37)
Albumin: 3.5 g/dL (ref 3.5–5.2)
Alkaline Phosphatase: 53 U/L (ref 39–117)
Potassium: 4.2 mEq/L (ref 3.5–5.3)
Sodium: 135 mEq/L (ref 135–145)
Total Protein: 5.9 g/dL — ABNORMAL LOW (ref 6.0–8.3)

## 2013-01-25 ENCOUNTER — Encounter: Payer: Self-pay | Admitting: Oncology

## 2013-02-06 LAB — LIPID PANEL: Triglycerides: 307 mg/dL — AB (ref 40–160)

## 2013-02-06 LAB — BASIC METABOLIC PANEL
BUN: 16 mg/dL (ref 4–21)
Creatinine: 0.5 mg/dL (ref 0.5–1.1)
Sodium: 137 mmol/L (ref 137–147)

## 2013-02-06 LAB — CBC AND DIFFERENTIAL: Platelets: 310 10*3/uL (ref 150–399)

## 2013-02-06 LAB — HEPATIC FUNCTION PANEL: Bilirubin, Total: 0.3 mg/dL

## 2013-02-07 ENCOUNTER — Non-Acute Institutional Stay (SKILLED_NURSING_FACILITY): Payer: Medicare Other | Admitting: Nurse Practitioner

## 2013-02-07 DIAGNOSIS — D649 Anemia, unspecified: Secondary | ICD-10-CM

## 2013-02-07 DIAGNOSIS — I4891 Unspecified atrial fibrillation: Secondary | ICD-10-CM

## 2013-02-07 DIAGNOSIS — G547 Phantom limb syndrome without pain: Secondary | ICD-10-CM

## 2013-02-07 DIAGNOSIS — C8404 Mycosis fungoides, lymph nodes of axilla and upper limb: Secondary | ICD-10-CM

## 2013-02-07 DIAGNOSIS — I10 Essential (primary) hypertension: Secondary | ICD-10-CM

## 2013-02-07 DIAGNOSIS — Z66 Do not resuscitate: Secondary | ICD-10-CM | POA: Insufficient documentation

## 2013-02-07 DIAGNOSIS — K5900 Constipation, unspecified: Secondary | ICD-10-CM

## 2013-02-07 DIAGNOSIS — I5032 Chronic diastolic (congestive) heart failure: Secondary | ICD-10-CM

## 2013-02-07 DIAGNOSIS — G546 Phantom limb syndrome with pain: Secondary | ICD-10-CM

## 2013-02-07 NOTE — Assessment & Plan Note (Signed)
Takes Furosemide 40mg  bid, Carvedilol 25mg  bid, clinically compensated.

## 2013-02-07 NOTE — Assessment & Plan Note (Signed)
Presented heart rate controlled on Cardizem 120mg , takes Pradaxa 150mg  bid

## 2013-02-07 NOTE — Assessment & Plan Note (Signed)
Right AKA--managed with Gabapentin 100mg nightly 

## 2013-02-07 NOTE — Assessment & Plan Note (Signed)
Takes Fe 325mg  bid and Folic acid 1mg  Hgb 12.3 02/06/13

## 2013-02-07 NOTE — Assessment & Plan Note (Signed)
Managed with MiraLax daily 

## 2013-02-07 NOTE — Progress Notes (Signed)
Patient ID: Sara [name], female   DOB: 1918/12/23, 79 y.o.   MRN: [id number]  Chief Complaint:  Chief Complaint  Patient presents with  . Medical Managment of Chronic Issues     HPI:    Problem List Items Addressed This Visit   Chronic diastolic HF (heart failure) (Chronic)     Takes Furosemide 40mg  bid, Carvedilol 25mg  bid, clinically compensated.       Phantom limb pain (Chronic)     Right AKA--managed with Gabapentin 100mg  nightly.       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 Cyndie Chime. Currently takes Targretin 75mg  daily ac supper.       Atrial fibrillation with RVR     Presented heart rate controlled on Cardizem 120mg , takes Pradaxa 150mg  bid      HTN (hypertension) - Primary     Controlled on Lisinopril 5mg  along with Cardizem and lasix and Carvedilol.       Unspecified constipation     Managed with MiraLax daily      DNR (do not resuscitate)      Review of Systems:  Review of Systems  Constitutional: Negative for fever, chills, weight loss, malaise/fatigue and diaphoresis.  HENT: Positive for hearing loss. Negative for ear pain, congestion, sore throat, neck pain and ear discharge.   Eyes: Negative for pain, discharge and redness.  Respiratory: Positive for cough. Negative for sputum production, shortness of breath and wheezing.   Cardiovascular: Negative for chest pain, palpitations, orthopnea, claudication, leg swelling and PND.  Gastrointestinal: Negative for heartburn, nausea, vomiting, abdominal pain, diarrhea, constipation and blood in stool.  Genitourinary: Positive for frequency (incontinent of bladder). Negative for dysuria, urgency, hematuria and flank pain.  Musculoskeletal: Positive for joint pain. Negative for myalgias, back pain and falls.  Skin: Positive for itching and rash.       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  New skin tear LLE  Neurological: Positive for weakness (generalized). Negative for dizziness, tingling, tremors, sensory change, speech change, focal weakness, seizures, loss of consciousness and headaches.  Endo/Heme/Allergies: Negative for environmental allergies and polydipsia. Bruises/bleeds easily.  Psychiatric/Behavioral: Positive for memory loss. Negative for depression and hallucinations. The patient is not nervous/anxious and does not have insomnia.      Medications: Patient's Medications  New Prescriptions   No medications on file  Previous Medications   ATORVASTATIN (LIPITOR) 40 MG TABLET    Take 1 tablet (40 mg total) by mouth daily at 6 PM.   BEXAROTENE (TARGRETIN) 75 MG CAPS CAPSULE    Take 75 mg by mouth daily before supper. Give with food. Protect from light. CAUTION: Chemotherapy/Biotherapy   CARVEDILOL (COREG) 25 MG TABLET    Take 1 tablet (25 mg total) by mouth 2 (two) times daily with a meal.   DABIGATRAN (PRADAXA) 150 MG CAPS    Take 1 capsule (150 mg total) by mouth every 12 (twelve) hours.   DILTIAZEM (CARDIZEM CD) 120 MG 24 HR CAPSULE    Take 120 mg by mouth daily.   FAMOTIDINE (PEPCID) 20 MG TABLET    Take 40 mg by mouth daily.   FERROUS SULFATE 325 (65 FE) MG TABLET    Take 325 mg by mouth 2 (two) times daily.   FOLIC ACID (FOLVITE) 1 MG TABLET    Take 1 mg  by mouth daily.   FUROSEMIDE (LASIX) 40 MG TABLET    Take 1 tablet (40 mg total) by mouth 2 (two) times daily.   GABAPENTIN (NEURONTIN) 100 MG CAPSULE    Take 1 capsule (100 mg total) by mouth at bedtime.   HYDROXYZINE (ATARAX/VISTARIL) 10 MG TABLET    Take 10 mg by mouth at bedtime.    LISINOPRIL (PRINIVIL,ZESTRIL) 5 MG TABLET    Take 10 mg by mouth daily.    NITROGLYCERIN (NITROSTAT) 0.4 MG SL TABLET    Place 0.4 mg under the tongue every 5 (five) minutes as needed for chest pain.    OMEGA-3 FATTY ACIDS (FISH OIL) 1000 MG CAPS    Take 1,000 mg by mouth at bedtime.    POLYETHYLENE GLYCOL (MIRALAX / GLYCOLAX)  PACKET    Take 17 g by mouth daily.   VITAMIN C (ASCORBIC ACID) 500 MG TABLET    Take 500 mg by mouth at bedtime.   Modified Medications   No medications on file  Discontinued Medications   No medications on file     Physical Exam: Physical Exam  Constitutional: She is oriented to person, place, and time. She appears well-developed and well-nourished. No distress.  HENT:  Head: Normocephalic.  Right Ear: External ear normal.  Left Ear: External ear normal.  Nose: Nose normal.  Mouth/Throat: Oropharynx is clear and moist. No oropharyngeal exudate.  Eyes: EOM are normal. Pupils are equal, round, and reactive to light. Right conjunctiva is injected.  Only a few eyelashes remained and right conjunctiva mild irritated with bilateral lower eyelid eversion.   Neck: Normal range of motion. Neck supple. JVD present. No thyromegaly present.  Cardiovascular: Normal rate, regular rhythm and normal heart sounds.  Exam reveals decreased pulses.   No murmur heard. Pulses:      Dorsalis pedis pulses are 1+ on the left side.       Posterior tibial pulses are 1+ on the left side.  Pulmonary/Chest: Effort normal and breath sounds normal. No respiratory distress. She has no wheezes. She exhibits no tenderness.  Abdominal: Soft. Bowel sounds are normal. She exhibits no distension. There is no tenderness.  Genitourinary:  Incontinent of bladder  Musculoskeletal: Normal range of motion. She exhibits no edema and no tenderness.  R AKA  Lymphadenopathy:    She has no cervical adenopathy.  Neurological: She is alert and oriented to person, place, and time. She has normal reflexes. She displays normal reflexes. No cranial nerve deficit. She exhibits normal muscle tone. Coordination normal.  Skin: Rash noted. There is erythema.     Excoriated buttocks-on and off since the patient is incontinent of her bladder at times and sitting for longer period of time since she is OOB more than prior. Right temporal  area treated with 5FU for [facility]  Psychiatric: She has a normal mood and affect. Her behavior is normal. Judgment and thought content normal. She exhibits abnormal recent memory.     Filed Vitals:   02/07/13 1454  BP: 116/76  Pulse: 66  Temp: 98.4 F (36.9 C)  TempSrc: Tympanic  Resp: 18      Labs reviewed:  Lipase  02/06/13 24  Amylase   02/03/13 23  Basic Metabolic Panel:  Recent Labs  16/10/96 2318 07/23/12 0645  11/03/12 0640 12/03/12 1407 12/03/12 1421 12/14/12 01/23/13 0720 02/06/13  NA 142 143  < > 132* 137 136 138 135 137  K 4.2 3.6  < > 4.1 4.2 4.1 4.2 4.2 4.2  CL 105 108  < > 94* 96 99  --  97  --   CO2  --  27  < > 29 29  --   --  28  --   GLUCOSE 126* 97  < > 95 94 95  --  83  --   BUN 25* 20  < > 19 25* 26* 20 15 16   CREATININE 1.10 0.87  < > 0.50 0.58 0.70 0.5 0.56 0.5  CALCIUM  --  9.2  < > 9.0 9.2  --   --  9.0  --   MG  --  2.1  --   --   --   --   --   --   --   PHOS  --  3.5  --   --   --   --   --   --   --   TSH  --  1.787  < >  --   --   --   --   --  0.60  < > = values in this interval not displayed.  Liver Function Tests:  Recent Labs  11/02/12 0530 12/03/12 1407 12/14/12 01/23/13 0720 02/06/13  AST 13 16 13 14 13   ALT 8 11 10 8 9   ALKPHOS 78 60 52 53 48  BILITOT 0.2* 0.2*  --  0.3  --   PROT 5.8* 6.4  --  5.9*  --   ALBUMIN 2.6* 3.3*  --  3.5  --     CBC:  Recent Labs  11/01/12 2027 11/02/12 0530  11/03/12 0640 12/03/12 1407  12/04/12 0820 12/14/12 02/06/13  WBC 23.0* 25.5*  < > 20.3* 26.9*  --  26.7* 26.8 24.2  NEUTROABS 6.7 9.2*  --   --  7.8*  --   --   --   --   HGB 12.8 11.8*  < > 11.7* 12.8  < > 13.1 12.0 12.3  HCT 37.6 35.1*  < > 34.8* 37.3  < > 38.7 36 36  MCV 86.4 86.2  < > 86.8 87.4  --  86.6  --   --   PLT 328 323  < > 296 290  --  306 339 310  < > = values in this interval not displayed.  Anemia Panel: No results found for this basename: IRON, FOLATE, VITAMINB12,  in the last 8760 hours  Significant  Diagnostic Results:     Assessment/Plan Phantom limb pain Right AKA--managed with Gabapentin 100mg  nightly.     Chronic diastolic HF (heart failure) Takes Furosemide 40mg  bid, Carvedilol 25mg  bid, clinically compensated.     Anemia Takes Fe 325mg  bid and Folic acid 1mg  Hgb 12.3 02/06/13    Atrial fibrillation with RVR Presented heart rate controlled on Cardizem 120mg , takes Pradaxa 150mg  bid    HTN (hypertension) Controlled on Lisinopril 5mg  along with Cardizem and lasix and Carvedilol.     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 Cyndie Chime. Currently takes Targretin 75mg  daily ac supper.         Family/ staff Communication: observe the patient.    Goals of care: may be [place]   Labs/tests ordered none

## 2013-02-07 NOTE — Assessment & Plan Note (Signed)
Controlled on Lisinopril 5mg  along with Cardizem and lasix and Carvedilol.

## 2013-02-07 NOTE — Assessment & Plan Note (Signed)
F/u Dr. Cyndie Chime. Currently takes Targretin 75mg  daily ac supper.

## 2013-02-15 ENCOUNTER — Ambulatory Visit (HOSPITAL_COMMUNITY)
Admission: RE | Admit: 2013-02-15 | Discharge: 2013-02-15 | Disposition: A | Discharge status: Home / Self Care | Payer: Medicare Other | Source: Ambulatory Visit | Attending: Cardiovascular Disease | Admitting: Cardiovascular Disease

## 2013-02-15 DIAGNOSIS — G458 Other transient cerebral ischemic attacks and related syndromes: Secondary | ICD-10-CM | POA: Insufficient documentation

## 2013-02-15 NOTE — Progress Notes (Signed)
Arterial Duplex Completed. Brianna L Mazza  

## 2013-02-19 ENCOUNTER — Ambulatory Visit (HOSPITAL_BASED_OUTPATIENT_CLINIC_OR_DEPARTMENT_OTHER): Payer: Medicare Other | Admitting: Nurse Practitioner

## 2013-02-19 VITALS — BP 144/63 | HR 64 | Temp 97.7°F | Resp 20

## 2013-02-19 DIAGNOSIS — C8404 Mycosis fungoides, lymph nodes of axilla and upper limb: Secondary | ICD-10-CM

## 2013-02-19 NOTE — Progress Notes (Signed)
OFFICE PROGRESS NOTE  Interval history:   Sara [name] is a 79 year old woman diagnosed with cutaneous T-cell lymphoma, mycosis fungoides, July 2013. She began a trial of low-dose Targretin in January of this year. The dose was escalated to 150 mg daily following an office visit on 12/19/2012. The skin appeared improved at an office visit 01/16/2013 and she was experiencing significantly less pruritus. Targretin was continued.  She is seen today for scheduled followup. Overall she reports that she feels well. She feels she is tolerating the Targretin well. She denies nausea/vomiting. No mouth sores. No diarrhea. She feels the Targretin has helped her skin. She continues to have mild pruritus. She has a good appetite.   Objective: Blood pressure 144/63, pulse 64, temperature 97.7 F (36.5 C), temperature source Oral, resp. rate 20.  Oropharynx is without thrush or ulcerations. No palpable cervical or subclavicular lymph nodes. Approximate 3 cm lymph node in the right axilla, question less discrete; less than 2 cm lymph node low left axilla/chest wall. Lungs clear. Regular cardiac rhythm. Abdomen soft and nontender. Right AKA. Left leg is without edema. Abdomen and back with marked erythema, warmth. Mild dry desquamation of the palms and soles. No erythema over the dorsal aspect of either hand or forearm.  Lab Results: Lab Results  Component Value Date   WBC 24.2 02/06/2013   HGB 12.3 02/06/2013   HCT 36 02/06/2013   MCV 86.6 12/04/2012   PLT 310 02/06/2013    Chemistry:    Chemistry      Component Value Date/Time   NA 137 02/06/2013   NA 135 01/23/2013 0720   NA 136 09/25/2012 1002   K 4.2 02/06/2013   K 4.2 09/25/2012 1002   CL 97 01/23/2013 0720   CL 102 09/25/2012 1002   CO2 28 01/23/2013 0720   CO2 28 09/25/2012 1002   BUN 16 02/06/2013   BUN 15 01/23/2013 0720   BUN 17.0 09/25/2012 1002   CREATININE 0.5 02/06/2013   CREATININE 0.56 01/23/2013 0720   CREATININE 0.70 12/03/2012 1421   CREATININE 0.9 09/25/2012 1002   GLU 101 02/06/2013      Component Value Date/Time   CALCIUM 9.0 01/23/2013 0720   CALCIUM 9.4 09/25/2012 1002   ALKPHOS 48 02/06/2013   ALKPHOS 64 09/25/2012 1002   AST 13 02/06/2013   AST 14 09/25/2012 1002   ALT 9 02/06/2013   ALT 13 09/25/2012 1002   BILITOT 0.3 01/23/2013 0720   BILITOT 0.30 09/25/2012 1002       Studies/Results: No results found.  Medications: I have reviewed the patient's current medications.  Assessment/Plan:  1. Mycosis fungoides/cutaneous T-cell lymphoma. She began Targretin, low-dose, January 2014. The dose was escalated to 150 mg daily following an office visit 12/19/2012. The skin appeared improved and she was experiencing significantly less pruritus at an office visit on 01/16/2013. The Targretin was continued.   2. Long-standing psoriasis.  3. Nonhealing wound right lateral malleolus with development of osteomyelitis requiring recent right AKA. 4. Chronic atrial fibrillation with recent embolic stroke. 5. Hypertension. 6. Coronary artery disease with previous MI; chronic congestive heart failure.  Disposition-Sara [name] appears stable. She appears to be deriving some clinical benefit from the Targretin. Dr. Cyndie Chime recommends continuation of Targretin for an additional month. She will return for a followup visit with Dr. Cyndie Chime on 03/20/2013. We will request labs to be drawn at the nursing facility on 03/06/2013 to include CBC with differential, chemistry panel, amylase, lipase, triglycerides, T4 total and TSH.  Patient seen with Dr. Cyndie Chime.  Lonna Cobb ANP/GNP-BC

## 2013-02-20 ENCOUNTER — Encounter: Payer: Self-pay | Admitting: *Deleted

## 2013-02-21 ENCOUNTER — Encounter: Payer: Self-pay | Admitting: *Deleted

## 2013-03-01 ENCOUNTER — Ambulatory Visit (INDEPENDENT_AMBULATORY_CARE_PROVIDER_SITE_OTHER): Payer: Medicare Other | Admitting: Cardiovascular Disease

## 2013-03-01 ENCOUNTER — Encounter: Payer: Self-pay | Admitting: Cardiovascular Disease

## 2013-03-01 VITALS — BP 100/62 | HR 56 | Ht 66.5 in

## 2013-03-01 DIAGNOSIS — I480 Paroxysmal atrial fibrillation: Secondary | ICD-10-CM

## 2013-03-01 DIAGNOSIS — I739 Peripheral vascular disease, unspecified: Secondary | ICD-10-CM

## 2013-03-01 DIAGNOSIS — I2510 Atherosclerotic heart disease of native coronary artery without angina pectoris: Secondary | ICD-10-CM | POA: Insufficient documentation

## 2013-03-01 DIAGNOSIS — R0989 Other specified symptoms and signs involving the circulatory and respiratory systems: Secondary | ICD-10-CM

## 2013-03-01 DIAGNOSIS — I4891 Unspecified atrial fibrillation: Secondary | ICD-10-CM

## 2013-03-01 NOTE — Assessment & Plan Note (Signed)
Status post cardiac catheterization by Dr. Royann Shivers  11/09/10 revealing three-vessel disease with normal LV function and mild aortic stenosis. Medical therapy was recommended.

## 2013-03-01 NOTE — Patient Instructions (Addendum)
Your physician recommends that you schedule a follow-up appointment in: [place] in 6 months Your physician recommends that you schedule a follow-up appointment in: 1 year

## 2013-03-01 NOTE — Progress Notes (Signed)
03/01/2013 Franchot Heidelberg   November 25, 1918  [id number]  Primary Physician Londell Moh, MD Primary Cardiologist: Runell Gess MD Roseanne Reno   HPI:  This 79 year old patient normally sees Dr. Allyson Sabal. She is here for post hospitalization after CVA. She was hospitalized first in January 2014 and had an above-the-knee amputation then she was readmitted for syncope felt to be symptomatic hypotension. The MRI of the brain was negative for acute infarct. She had an echo and carotid Dopplers that were stable and no other issues. She was given IV fluids. At that time, she was on aspirin and Plavix. Her carvedilol was down to 12.5 mg twice a day and her lisinopril was at 5 mg a day, previously had been at 40. She was readmitted in February due to dyspnea on exertion and nonhealing wound. She improved from that hospitalization and I do not see that we were consulted on any of these hospitalizations. On December 03, 2012, she was readmitted with acute stroke. She was having speech difficulties. She knew what she wanted to say but she could not get the words out. She was found to be in A-fib with RVR and medications were adjusted including increasing her carvedilol back to 25 mg twice a day. She was discharged on December 05, 2012, and is in the skilled nursing facility at [facility] for rehab from her AKA as well as her CVA, though, she has not much residual from the CVA. At the time of discharge, on December 03, 2012, she was in sinus rhythm. During that hospitalization she had A-fib with RVR but on arrival to [facility] she was back in sinus rhythm as well. Since that time, she stabilized. Previously, we only had her on aspirin and Plavix, but due to the new right-sided CVA, most likely embolic from her A-fib, we added Pradaxa and stopped her aspirin completely. Additionally, in the past she has been diagnosed with psoriasis but she has been followed with Dr. Cyndie Chime and it has been decided that it  is a cutaneous T-cell lymphoma and she is on Targretin for this. Other history does include coronary artery disease, significant 3-vessel disease and is not felt to be a good bypass candidate. She was seen by Dr. Laneta Simmers in the past and medical therapy only has been recommended. She has also had a high-grade left internal carotid artery stenosis that Dr. Allyson Sabal stented July 2012 under the [facility]  protocol. Dopplers during this year with hospitalization reveal that the stent was widely patent.   The patient overall has done fairly well. She feels much better now and she is being treated for her skin lymphoma. Since she was last seen in the office 01/15/13 Pyatt Bryna Colander RN P. She feels clinically well. She specifically denies chest pain or shortness of breath. She is wheelchair-bound because of her right above-the-knee amputation was performed 10/10/12 and has yet to be fitted with a prosthesis.      Current Outpatient Prescriptions  Medication Sig Dispense Refill  . ipratropium-albuterol (DUONEB) 0.5-2.5 (3) MG/3ML SOLN Take 3 mLs by nebulization every 6 (six) hours as needed.      Marland Kitchen levocetirizine (XYZAL) 5 MG tablet Take 5 mg by mouth every evening.      . Polyvinyl Alcohol-Povidone (REFRESH OP) Apply 1 drop to eye 4 (four) times daily.      Marland Kitchen acetaminophen (TYLENOL) 325 MG tablet Take 325 mg by mouth every 4 (four) hours as needed for pain. Take 2 tablets every 4 hours  as needed for pain or fever as needed.      Marland Kitchen atorvastatin (LIPITOR) 40 MG tablet Take 1 tablet (40 mg total) by mouth daily at 6 PM.      . bexarotene (TARGRETIN) 75 MG CAPS capsule Take 150 mg by mouth daily before supper. Give with food. Protect from light. CAUTION: Chemotherapy/Biotherapy      . carvedilol (COREG) 25 MG tablet Take 25 mg by mouth daily. Take 1/2 tablet twice daily to treat HTN.      . dabigatran (PRADAXA) 150 MG CAPS Take 75 mg by mouth daily.      Marland Kitchen diltiazem (CARDIZEM CD) 120 MG 24 hr capsule Take 120 mg by  mouth daily. Take 1 tablet once daily for BP.      . famotidine (PEPCID) 20 MG tablet Take 40 mg by mouth daily.      . ferrous sulfate 325 (65 FE) MG tablet Take 325 mg by mouth 2 (two) times daily. Take one tablet twice daily for iron supplement.      . folic acid (FOLVITE) 1 MG tablet Take 1 mg by mouth daily.      . furosemide (LASIX) 40 MG tablet Take 1 tablet (40 mg total) by mouth 2 (two) times daily.  30 tablet  0  . gabapentin (NEURONTIN) 100 MG capsule Take 1 capsule (100 mg total) by mouth at bedtime.      . hydrOXYzine (ATARAX/VISTARIL) 10 MG tablet Take 10 mg by mouth at bedtime. Take 1 tablet before bed for anxiety.      Marland Kitchen lisinopril (PRINIVIL,ZESTRIL) 5 MG tablet Take 10 mg by mouth daily. Take 1 tablet daily for blood pressure and protect kidneys.      . nitroGLYCERIN (NITROSTAT) 0.4 MG SL tablet Place 0.4 mg under the tongue every 5 (five) minutes as needed for chest pain.       . Omega-3 Fatty Acids (FISH OIL) 1000 MG CAPS Take 1,000 mg by mouth at bedtime.       . polyethylene glycol (MIRALAX / GLYCOLAX) packet Take 17 g by mouth daily. Take 1 packet mixed in 4 oz of beverage every day to relieve constipation.      . vitamin C (ASCORBIC ACID) 500 MG tablet Take 500 mg by mouth at bedtime. Take 1 tablet daily for vitamin C supplement.       No current facility-administered medications for this visit.    Allergies  Allergen Reactions  . Evista (Raloxifene)   . Penicillins Other (See Comments)    unknown  . Triamterene Other (See Comments)    unknown  . Alendronate Sodium Rash    Pt stated oh k to use biotene    History   Social History  . Marital Status: Widowed    Spouse Name: N/A    Number of Children: N/A  . Years of Education: N/A   Occupational History  . Not on file.   Social History Main Topics  . Smoking status: Never Smoker   . Smokeless tobacco: Never Used  . Alcohol Use: No  . Drug Use: No  . Sexually Active: No   Other Topics Concern  . Not  on file   Social History Narrative  . No narrative on file     Review of Systems: General: negative for chills, fever, night sweats or weight changes.  Cardiovascular: negative for chest pain, dyspnea on exertion, edema, orthopnea, palpitations, paroxysmal nocturnal dyspnea or shortness of breath Dermatological: negative for rash Respiratory: negative  for cough or wheezing Urologic: negative for hematuria Abdominal: negative for nausea, vomiting, diarrhea, bright red blood per rectum, melena, or hematemesis Neurologic: negative for visual changes, syncope, or dizziness All other systems reviewed and are otherwise negative except as noted above.    Blood pressure 100/62, pulse 56, height 5' 6.5" (1.689 m), weight 0 lb (0 kg).  General appearance: alert and no distress Neck: no adenopathy, no JVD, supple, symmetrical, trachea midline, thyroid not enlarged, symmetric, no tenderness/mass/nodules and loud right carotid bruit Lungs: clear to auscultation bilaterally Heart: regular rate and rhythm, S1, S2 normal, no murmur, click, rub or gallop Extremities: status post right AKA  EKG normal sinus rhythm at 63 with nonspecific ST and T-wave changes  ASSESSMENT AND PLAN:   PAF (paroxysmal atrial fibrillation) Status post CVA in March of this year. She is currently in sinus rhythm on per Dr.  PVD, Lt CA stent 04/02/11 Status post left internal carotid artery stenosis 03/25/11. Followup Dopplers performed in August of last year, one month after her stent procedure, revealed her to be widely patent. She did have no significant disease in her right internal carotid artery. This will be repeated on an annual basis.  Coronary artery disease Status post cardiac catheterization by Dr. Royann Shivers  11/09/10 revealing three-vessel disease with normal LV function and mild aortic stenosis. Medical therapy was recommended.      Runell Gess MD FACP,FACC,FAHA, [facility] 03/01/2013 11:54 AM

## 2013-03-01 NOTE — Assessment & Plan Note (Signed)
Status post left internal carotid artery stenosis 03/25/11. Followup Dopplers performed in August of last year, one month after her stent procedure, revealed her to be widely patent. She did have no significant disease in her right internal carotid artery. This will be repeated on an annual basis.

## 2013-03-01 NOTE — Assessment & Plan Note (Signed)
Status post CVA in March of this year. She is currently in sinus rhythm on per Dr.

## 2013-03-01 NOTE — Addendum Note (Signed)
Addended by: Barrie Dunker on: 03/01/2013 02:20 PM   Modules accepted: Orders

## 2013-03-07 ENCOUNTER — Other Ambulatory Visit: Payer: Self-pay | Admitting: *Deleted

## 2013-03-12 ENCOUNTER — Non-Acute Institutional Stay (SKILLED_NURSING_FACILITY): Payer: Medicare Other | Admitting: Nurse Practitioner

## 2013-03-12 DIAGNOSIS — G546 Phantom limb syndrome with pain: Secondary | ICD-10-CM

## 2013-03-12 DIAGNOSIS — I4891 Unspecified atrial fibrillation: Secondary | ICD-10-CM

## 2013-03-12 DIAGNOSIS — G547 Phantom limb syndrome without pain: Secondary | ICD-10-CM

## 2013-03-12 DIAGNOSIS — I10 Essential (primary) hypertension: Secondary | ICD-10-CM

## 2013-03-12 DIAGNOSIS — D649 Anemia, unspecified: Secondary | ICD-10-CM

## 2013-03-12 NOTE — Assessment & Plan Note (Signed)
Presented heart rate controlled on Cardizem 120mg , takes Pradaxa 150mg  bid

## 2013-03-12 NOTE — Assessment & Plan Note (Signed)
Controlled on Lisinopril 5mg  along with Cardizem and lasix and Carvedilol with mild elevated Sbp in 140s.

## 2013-03-12 NOTE — Assessment & Plan Note (Signed)
Right AKA--managed with Gabapentin 100mg nightly 

## 2013-03-12 NOTE — Progress Notes (Signed)
Patient ID: Sara [name], female   DOB: [birth year], 79 y.o.   MRN: [id number]  Chief Complaint:  Chief Complaint  Patient presents with  . Medical Managment of Chronic Issues     HPI:   Problem List Items Addressed This Visit   Anemia     Takes Fe 325mg  bid and Folic acid 1mg  Hgb 12.3 02/06/13 and serum iron 54 02/20/13        Atrial fibrillation with RVR     Presented heart rate controlled on Cardizem 120mg , takes Pradaxa 150mg  bid        Hypertension, uncontrolled on adm 03/20/12 - Primary     Controlled on Lisinopril 5mg  along with Cardizem and lasix and Carvedilol with mild elevated Sbp in 140s.         Phantom limb pain (Chronic)     Right AKA--managed with Gabapentin 100mg  nightly.            Review of Systems:  Review of Systems  Constitutional: Negative for fever, chills, weight loss, malaise/fatigue and diaphoresis.  HENT: Positive for hearing loss. Negative for ear pain, congestion, sore throat, neck pain and ear discharge.   Eyes: Negative for pain, discharge and redness.  Respiratory: Positive for cough. Negative for sputum production, shortness of breath and wheezing.   Cardiovascular: Negative for chest pain, palpitations, orthopnea, claudication, leg swelling and PND.  Gastrointestinal: Negative for heartburn, nausea, vomiting, abdominal pain, diarrhea, constipation and blood in stool.  Genitourinary: Positive for frequency (incontinent of bladder). Negative for dysuria, urgency, hematuria and flank pain.  Musculoskeletal: Positive for joint pain. Negative for myalgias, back pain and falls.  Skin: Positive for itching and rash.       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 New skin tear LLE  Neurological: Positive for weakness (generalized). Negative for dizziness, tingling, tremors, sensory change, speech change, focal weakness, seizures, loss of consciousness and headaches.  Endo/Heme/Allergies: Negative for environmental allergies and polydipsia.  Bruises/bleeds easily.  Psychiatric/Behavioral: Positive for memory loss. Negative for depression and hallucinations. The patient is not nervous/anxious and does not have insomnia.      Medications: Patient's Medications  New Prescriptions   No medications on file  Previous Medications   ACETAMINOPHEN (TYLENOL) 325 MG TABLET    Take 325 mg by mouth every 4 (four) hours as needed for pain. Take 2 tablets every 4 hours as needed for pain or fever as needed.   ATORVASTATIN (LIPITOR) 40 MG TABLET    Take 1 tablet (40 mg total) by mouth daily at 6 PM.   BEXAROTENE (TARGRETIN) 75 MG CAPS CAPSULE    Take 150 mg by mouth daily before supper. Give with food. Protect from light. CAUTION: Chemotherapy/Biotherapy   CARVEDILOL (COREG) 25 MG TABLET    Take 25 mg by mouth daily. Take 1/2 tablet twice daily to treat HTN.   DABIGATRAN (PRADAXA) 150 MG CAPS    Take 75 mg by mouth daily.   DILTIAZEM (CARDIZEM CD) 120 MG 24 HR CAPSULE    Take 120 mg by mouth daily. Take 1 tablet once daily for BP.   FAMOTIDINE (PEPCID) 20 MG TABLET    Take 40 mg by mouth daily.   FERROUS SULFATE 325 (65 FE) MG TABLET    Take 325 mg by mouth 2 (two) times daily. Take one tablet twice daily for iron supplement.   FOLIC ACID (FOLVITE) 1 MG TABLET    Take 1 mg by mouth daily.   FUROSEMIDE (LASIX) 40 MG TABLET  Take 1 tablet (40 mg total) by mouth 2 (two) times daily.   GABAPENTIN (NEURONTIN) 100 MG CAPSULE    Take 1 capsule (100 mg total) by mouth at bedtime.   HYDROXYZINE (ATARAX/VISTARIL) 10 MG TABLET    Take 10 mg by mouth at bedtime. Take 1 tablet before bed for anxiety.   IPRATROPIUM-ALBUTEROL (DUONEB) 0.5-2.5 (3) MG/3ML SOLN    Take 3 mLs by nebulization every 6 (six) hours as needed.   LEVOCETIRIZINE (XYZAL) 5 MG TABLET    Take 5 mg by mouth every evening.   LISINOPRIL (PRINIVIL,ZESTRIL) 5 MG TABLET    Take 10 mg by mouth daily. Take 1 tablet daily for blood pressure and protect kidneys.   NITROGLYCERIN (NITROSTAT) 0.4 MG  SL TABLET    Place 0.4 mg under the tongue every 5 (five) minutes as needed for chest pain.    OMEGA-3 FATTY ACIDS (FISH OIL) 1000 MG CAPS    Take 1,000 mg by mouth at bedtime.    POLYETHYLENE GLYCOL (MIRALAX / GLYCOLAX) PACKET    Take 17 g by mouth daily. Take 1 packet mixed in 4 oz of beverage every day to relieve constipation.   POLYVINYL ALCOHOL-POVIDONE (REFRESH OP)    Apply 1 drop to eye 4 (four) times daily.   VITAMIN C (ASCORBIC ACID) 500 MG TABLET    Take 500 mg by mouth at bedtime. Take 1 tablet daily for vitamin C supplement.  Modified Medications   No medications on file  Discontinued Medications   No medications on file     Physical Exam: Physical Exam  Constitutional: She is oriented to person, place, and time. She appears well-developed and well-nourished. No distress.  HENT:  Head: Normocephalic.  Right Ear: External ear normal.  Left Ear: External ear normal.  Nose: Nose normal.  Mouth/Throat: Oropharynx is clear and moist. No oropharyngeal exudate.  Eyes: EOM are normal. Pupils are equal, round, and reactive to light. Right conjunctiva is injected.  Only a few eyelashes remained and right conjunctiva mild irritated with bilateral lower eyelid eversion.   Neck: Normal range of motion. Neck supple. JVD present. No thyromegaly present.  Cardiovascular: Normal rate, regular rhythm and normal heart sounds.  Exam reveals decreased pulses.   No murmur heard. Pulses:      Dorsalis pedis pulses are 1+ on the left side.       Posterior tibial pulses are 1+ on the left side.  Pulmonary/Chest: Effort normal and breath sounds normal. No respiratory distress. She has no wheezes. She exhibits no tenderness.  Abdominal: Soft. Bowel sounds are normal. She exhibits no distension. There is no tenderness.  Genitourinary:  Incontinent of bladder  Musculoskeletal: Normal range of motion. She exhibits no edema and no tenderness.  R AKA  Lymphadenopathy:    She has no cervical  adenopathy.  Neurological: She is alert and oriented to person, place, and time. She has normal reflexes. She displays normal reflexes. No cranial nerve deficit. She exhibits normal muscle tone. Coordination normal.  Skin: Rash noted. There is erythema.     Excoriated buttocks-on and off since the patient is incontinent of her bladder at times and sitting for longer period of time since she is OOB more than prior. Right temporal area treated with 5FU for SCC--improved.   Psychiatric: She has a normal mood and affect. Her behavior is normal. Judgment and thought content normal. She exhibits abnormal recent memory.     Filed Vitals:   03/12/13 1306  BP: 144/76  Pulse: 72  Temp: 97.6 F (36.4 C)  TempSrc: Tympanic  Resp: 20      Labs reviewed: Basic Metabolic Panel:  Recent Labs  16/10/96 2318 07/23/12 0645  11/03/12 0640 12/03/12 1407 12/03/12 1421 12/14/12 01/23/13 0720 02/06/13  NA 142 143  < > 132* 137 136 138 135 137  K 4.2 3.6  < > 4.1 4.2 4.1 4.2 4.2 4.2  CL 105 108  < > 94* 96 99  --  97  --   CO2  --  27  < > 29 29  --   --  28  --   GLUCOSE 126* 97  < > 95 94 95  --  83  --   BUN 25* 20  < > 19 25* 26* 20 15 16   CREATININE 1.10 0.87  < > 0.50 0.58 0.70 0.5 0.56 0.5  CALCIUM  --  9.2  < > 9.0 9.2  --   --  9.0  --   MG  --  2.1  --   --   --   --   --   --   --   PHOS  --  3.5  --   --   --   --   --   --   --   TSH  --  1.787  < >  --   --   --   --   --  0.60  < > = values in this interval not displayed.  Liver Function Tests:  Recent Labs  11/02/12 0530 12/03/12 1407 12/14/12 01/23/13 0720 02/06/13  AST 13 16 13 14 13   ALT 8 11 10 8 9   ALKPHOS 78 60 52 53 48  BILITOT 0.2* 0.2*  --  0.3  --   PROT 5.8* 6.4  --  5.9*  --   ALBUMIN 2.6* 3.3*  --  3.5  --     CBC:  Recent Labs  11/01/12 2027 11/02/12 0530  11/03/12 0640 12/03/12 1407  12/04/12 0820 12/14/12 02/06/13  WBC 23.0* 25.5*  < > 20.3* 26.9*  --  26.7* 26.8 24.2  NEUTROABS 6.7 9.2*   --   --  7.8*  --   --   --   --   HGB 12.8 11.8*  < > 11.7* 12.8  < > 13.1 12.0 12.3  HCT 37.6 35.1*  < > 34.8* 37.3  < > 38.7 36 36  MCV 86.4 86.2  < > 86.8 87.4  --  86.6  --   --   PLT 328 323  < > 296 290  --  306 339 310  < > = values in this interval not displayed.  Anemia Panel: No results found for this basename: IRON, FOLATE, VITAMINB12,  in the last 8760 hours  Significant Diagnostic Results:     Assessment/Plan Hypertension, uncontrolled on adm 03/20/12 Controlled on Lisinopril 5mg  along with Cardizem and lasix and Carvedilol with mild elevated Sbp in 140s.       Anemia Takes Fe 325mg  bid and Folic acid 1mg  Hgb 12.3 02/06/13 and serum iron 54 02/20/13      Atrial fibrillation with RVR Presented heart rate controlled on Cardizem 120mg , takes Pradaxa 150mg  bid      Phantom limb pain Right AKA--managed with Gabapentin 100mg  nightly.           Family/ staff Communication: skin care.    Goals of care: SNF and may ambulate with her prosthesis.  Labs/tests ordered none.

## 2013-03-12 NOTE — Assessment & Plan Note (Signed)
Takes Fe 325mg  bid and Folic acid 1mg  Hgb 12.3 02/06/13 and serum iron 54 02/20/13

## 2013-03-20 ENCOUNTER — Ambulatory Visit (HOSPITAL_BASED_OUTPATIENT_CLINIC_OR_DEPARTMENT_OTHER): Payer: Medicare Other | Admitting: Oncology

## 2013-03-20 ENCOUNTER — Telehealth: Payer: Self-pay | Admitting: Oncology

## 2013-03-20 VITALS — BP 182/79 | HR 65 | Temp 98.6°F | Resp 20

## 2013-03-20 DIAGNOSIS — C8404 Mycosis fungoides, lymph nodes of axilla and upper limb: Secondary | ICD-10-CM

## 2013-03-20 NOTE — Telephone Encounter (Signed)
Gave pt and daughter appt calendar for August 2014 lab and ML

## 2013-03-20 NOTE — Progress Notes (Signed)
Hematology and Oncology Follow Up Visit  Sara [name] [id number] 09/11/1919 79 y.o. 03/20/2013 3:13 PM   Principle Diagnosis: Encounter Diagnosis  Name Primary?  . 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 I was pleasantly surprised when I examined her today. I do think there has been an objective response to Targretin at current dose 150 mg daily. It has now been one year since diagnosis in July 2013. The Targretin was started in January 2014. She had a number of major medical problems over the last 6 months detailed in my progress note dated 12/19/2012. The Targretin was initially started a dose of 75 mg daily. Dose was escalated at the end of March. She has tolerated the drug well. We have monitored renal function, liver function, thyroid function and lipid profile. As well as her CBC. She has a persistent leukocytosis with absolute lymphocytosis but total white count has remained stable. Hemoglobin has remained stable. Platelet count has remained normal. She has minimal adenopathy exam limited to her axillae. No central adenopathy or splenomegaly on CT scans.  Medications: reviewed  Allergies:  Allergies  Allergen Reactions  . Evista (Raloxifene)   . Penicillins Other (See Comments)    unknown  . Triamterene Other (See Comments)    unknown  . Alendronate Sodium Rash    Pt stated oh k to use biotene    Review of Systems: Constitutional:   No constitutional symptoms Respiratory: No cough or dyspnea Cardiovascular:  No chest pain or palpitations Gastrointestinal: No abdominal pain no change in bowel habit Genito-Urinary: No urinary tract symptoms Musculoskeletal: No change in chronic arthritis pain Neurologic: No headache or change in vision Skin: Improved rash and pruritus Remaining ROS negative.  Physical Exam: Blood pressure 182/79, pulse 65,  temperature 98.6 F (37 C), temperature source Oral, resp. rate 20. Wt Readings from Last 3 Encounters:  01/16/13 131 lb 14.4 oz (59.829 kg)  12/19/12 124 lb (56.246 kg)  12/06/12 119 lb 4.8 oz (54.114 kg)     General appearance: Frail elderly woman HENNT: Pharynx no erythema or exudate Lymph nodes: Axillary lymph nodes less prominent approximate 2 cm on the left and 3 cm on the right Breasts: Lungs: Clear to auscultation resonant to percussion Heart: Regular rhythm Abdomen: Soft, nontender Extremities: She is now status post right below the knee amputation Musculoskeletal: No joint deformities other than noted above GU: Vascular: No cyanosis Neurologic: Alert and oriented, PERRLA, motor strength 5 over 5 Skin: Significant decrease in generalized erythema particularly on her upper back. Resolved desquamation of the palms of her hands and the sole of her left foot.  Lab Results: CBC done 03/02/2013 hemoglobin 12, hematocrit 36, white count 24,000, platelets 310,000 Lab Results: White count differential 22% neutrophils, 70% lymphocytes   Component Value Date   WBC 24.2 02/06/2013   HGB 12.3 02/06/2013   HCT 36 02/06/2013   MCV 86.6 12/04/2012   PLT 310 02/06/2013     Chemistry      Component Value Date/Time   NA 137 02/06/2013   NA 135 01/23/2013 0720   NA 136 09/25/2012 1002   K 4.2 02/06/2013   K 4.2 09/25/2012 1002   CL 97 01/23/2013 0720   CL 102 09/25/2012 1002   CO2 28 01/23/2013 0720   CO2 28 09/25/2012 1002   BUN 16 02/06/2013   BUN 15 01/23/2013 0720  BUN 17.0 09/25/2012 1002   CREATININE 0.5 02/06/2013   CREATININE 0.56 01/23/2013 0720   CREATININE 0.70 12/03/2012 1421   CREATININE 0.9 09/25/2012 1002   GLU 101 02/06/2013      Component Value Date/Time   CALCIUM 9.0 01/23/2013 0720   CALCIUM 9.4 09/25/2012 1002   ALKPHOS 48 02/06/2013   ALKPHOS 64 09/25/2012 1002   AST 13 02/06/2013   AST 14 09/25/2012 1002   ALT 9 02/06/2013   ALT 13 09/25/2012 1002   BILITOT 0.3 01/23/2013  0720   BILITOT 0.30 09/25/2012 1002     Impression: #1. Mycosis fungoides She appears to be responding to low-dose Targretin. I will continue her at the current dose of 150 mg daily. Continue to monitor on an every 4-6 week basis. Continue monthly lab evaluations.  #2. Long-standing psoriasis  .  #3. Nonhealing wound right lateral malleolus with development of osteomyelitis requiring recent right AKA..   #4. Chronic atrial fibrillation with recent embolic stroke. She was on aspirin. Pradaxa was started. In view of her advanced age, I am decreasing the dose to 75 mg twice daily.   #5. Essential hypertension   #6. Coronary artery disease with previous MI; Chronic congestive heart failure with recent decompensation requiring hospitalization in November 2013   CC:Marland Kitchen    Levert Feinstein, MD 7/8/20143:13 PM

## 2013-04-05 ENCOUNTER — Telehealth: Payer: Self-pay | Admitting: *Deleted

## 2013-04-05 NOTE — Telephone Encounter (Signed)
Received call from [facility] stating that she had an order for CMP, cbc, T4, TSH, & triglycerides for today & wants to know if it can be done tomorrow.  OK given for labs tomorrow.

## 2013-04-16 ENCOUNTER — Encounter: Payer: Self-pay | Admitting: Oncology

## 2013-05-04 LAB — BASIC METABOLIC PANEL
BUN: 12 mg/dL (ref 4–21)
Creatinine: 0.6 mg/dL (ref 0.5–1.1)
Glucose: 90 mg/dL
Potassium: 4 mmol/L (ref 3.4–5.3)
Sodium: 136 mmol/L — AB (ref 137–147)

## 2013-05-08 ENCOUNTER — Telehealth: Payer: Self-pay | Admitting: Oncology

## 2013-05-08 ENCOUNTER — Ambulatory Visit (HOSPITAL_BASED_OUTPATIENT_CLINIC_OR_DEPARTMENT_OTHER): Payer: Medicare Other | Admitting: Nurse Practitioner

## 2013-05-08 ENCOUNTER — Other Ambulatory Visit (HOSPITAL_BASED_OUTPATIENT_CLINIC_OR_DEPARTMENT_OTHER): Payer: Medicare Other | Admitting: Lab

## 2013-05-08 VITALS — BP 176/65 | HR 18 | Temp 97.7°F | Resp 18

## 2013-05-08 DIAGNOSIS — C8404 Mycosis fungoides, lymph nodes of axilla and upper limb: Secondary | ICD-10-CM

## 2013-05-08 LAB — COMPREHENSIVE METABOLIC PANEL (CC13)
AST: 16 U/L (ref 5–34)
Albumin: 3.2 g/dL — ABNORMAL LOW (ref 3.5–5.0)
BUN: 10.9 mg/dL (ref 7.0–26.0)
Calcium: 9.3 mg/dL (ref 8.4–10.4)
Chloride: 99 mEq/L (ref 98–109)
Potassium: 3.4 mEq/L — ABNORMAL LOW (ref 3.5–5.1)
Sodium: 140 mEq/L (ref 136–145)
Total Protein: 6.9 g/dL (ref 6.4–8.3)

## 2013-05-08 LAB — CBC WITH DIFFERENTIAL/PLATELET
BASO%: 0.3 % (ref 0.0–2.0)
EOS%: 1.2 % (ref 0.0–7.0)
Eosinophils Absolute: 0.3 10*3/uL (ref 0.0–0.5)
LYMPH%: 68.8 % — ABNORMAL HIGH (ref 14.0–49.7)
MCH: 30.5 pg (ref 25.1–34.0)
MCHC: 32.9 g/dL (ref 31.5–36.0)
MCV: 92.6 fL (ref 79.5–101.0)
MONO%: 4.4 % (ref 0.0–14.0)
NEUT#: 6.6 10*3/uL — ABNORMAL HIGH (ref 1.5–6.5)
RBC: 4.2 10*6/uL (ref 3.70–5.45)
RDW: 13 % (ref 11.2–14.5)
nRBC: 0 % (ref 0–0)

## 2013-05-08 LAB — MORPHOLOGY

## 2013-05-08 NOTE — Telephone Encounter (Signed)
gv pt appt schedule for September. Pt/relative aware I will call re appt w/Dr. Dayton Scrape. Per relative [facility] should be contacted with the appt @ [phone]. Pt/relative made aware primary number will be changed to [facility] if that is who should be contacted with appts.

## 2013-05-08 NOTE — Progress Notes (Addendum)
OFFICE PROGRESS NOTE  Interval history:   Sara [name] is a 79 year old woman diagnosed with cutaneous T-cell lymphoma, mycosis fungoides, July 2013. She began a trial of low-dose Targretin in January of this year. The dose was escalated to 150 mg daily following an office visit on 12/19/2012. The skin appeared improved at an office visit 01/16/2013 and she was experiencing significantly less pruritus. Targretin was continued. She was last seen in an office visit on 03/20/2013. Targretin was continued at 150 mg daily.  She is seen today for scheduled followup. She continues to note cyclic peeling of the skin over her hands. Overall she continues to feel the Targretin has improved her skin and the pruritus. Over the past 4-5 months she has noted the development of multiple cutaneous lesions over the chest/between the breasts. She denies nausea/vomiting. No diarrhea.   Objective: Blood pressure 176/65, pulse 18, temperature 97.7 F (36.5 C), temperature source Oral, resp. rate 18.  Oropharynx is without thrush or ulceration. Both ear canals with dry desquamation. The skin over the hands and forearms appears thin/fragile. Scattered areas of dry desquamation over the abdomen and back. Generalized erythema over the back. Scattered cutaneous skin lesions at the mid chest region, between the breasts and upper abdomen. Similar cutaneous skin lesions at the left lateral chest. Lungs are clear. Regular cardiac rhythm. Abdomen is soft. Status post right above the knee mutation. Left leg is without edema. Approximate 2 cm left axillary lymph node and 3 cm right axillary lymph node.  Lab Results: Lab Results  Component Value Date   WBC 25.8* 05/08/2013   HGB 12.8 05/08/2013   HCT 38.9 05/08/2013   MCV 92.6 05/08/2013   PLT 307 05/08/2013    Chemistry:    Chemistry      Component Value Date/Time   NA 140 05/08/2013 1420   NA 137 02/06/2013   NA 135 01/23/2013 0720   K 3.4* 05/08/2013 1420   K 4.2 02/06/2013   CL 97 01/23/2013 0720   CL 102 09/25/2012 1002   CO2 28 05/08/2013 1420   CO2 28 01/23/2013 0720   BUN 10.9 05/08/2013 1420   BUN 16 02/06/2013   BUN 15 01/23/2013 0720   CREATININE 0.8 05/08/2013 1420   CREATININE 0.5 02/06/2013   CREATININE 0.56 01/23/2013 0720   CREATININE 0.70 12/03/2012 1421   GLU 101 02/06/2013      Component Value Date/Time   CALCIUM 9.3 05/08/2013 1420   CALCIUM 9.0 01/23/2013 0720   ALKPHOS 64 05/08/2013 1420   ALKPHOS 48 02/06/2013   AST 16 05/08/2013 1420   AST 13 02/06/2013   ALT 12 05/08/2013 1420   ALT 9 02/06/2013   BILITOT 0.27 05/08/2013 1420   BILITOT 0.3 01/23/2013 0720       Studies/Results: No results found.  Medications: I have reviewed the patient's current medications.  Assessment/Plan:  1. Mycosis fungoides/cutaneous T-cell lymphoma. She began Targretin, low-dose, January 2014. The dose was escalated to 150 mg daily following an office visit 12/19/2012.  2. Long-standing psoriasis.  3. Nonhealing wound right lateral malleolus with development of osteomyelitis requiring recent right AKA. 4. Chronic atrial fibrillation with recent embolic stroke. 5. Hypertension. 6. Coronary artery disease with previous MI; chronic congestive heart failure.  Disposition-overall Ms. Travieso appears stable. The pruritus continues to be improved. She has however developed multiple cutaneous lesions at the anterior chest and left posterolateral chest wall. Dr. Cyndie Chime recommends continuation of Targretin at the current dose of 150 mg daily and a  referral to Dr. Dayton Scrape, radiation oncology, for consideration of electron beam radiation.  We will continue to check labs on a monthly basis. She will return for a followup visit in 4-6 weeks.  Patient seen with Dr. Cyndie Chime.   Lonna Cobb ANP/GNP-BC   Oncology attending: I personally interviewed and examined this patient along with the nurse practitioner. History and physical exam are accurate as recorded above. She has  mycosis fungoides. Diagnosis was delayed due to to long-standing psoriasis. She has had a moderate response to oral Targretin. She has now developed multiple skin papules on the trunk. She has had less desquamation and erythema on the Targretin. I believe she may benefit from electron beam radiation treatment. I discussed her situation with one of our radiation oncologists,  Dr. Chipper Herb. He informed me that [facility] as more a technically advanced system of delivering electron beam radiation treatment for cutaneous T-cell lymphoma but if the patient is unwilling or unable to be treated at [facility], he could treat here in [place]. The patient is now 79 years old. Since her diagnosis she has required a right below the knee amputation for osteomyelitis. She is unable to travel great distances. She would preferr to take treatment here in [place]. Radiation oncology referral therefore made for our facility here in [place]. I will continue the Targretin at the current dose. Lab monitoring today has not shown any significant change in lipid profile, thyroid functions, liver functions, or hematologic profile.    Cephas Darby, MD, FACP  Hematology-Oncology

## 2013-05-10 ENCOUNTER — Telehealth: Payer: Self-pay | Admitting: Oncology

## 2013-05-10 NOTE — Telephone Encounter (Signed)
S/w Clydie Braun in radonc yesterday and appt w/Dr. Dayton Scrape had already been added. Per Clydie Braun she has contacted pt's dtr and [facility].

## 2013-05-16 NOTE — Progress Notes (Addendum)
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  Single area of right axillary adenopathy 05/2012  Biopsies of  (if applicable) revealed: See flow cytometry  Past/Anticipated interventions by medical oncology, if any: She began a trial of low-dose Targretin in January of this year. The dose was escalated to 150 mg daily following an office visit on 12/19/2012. The skin appeared improved at an office visit 01/16/2013 and she was experiencing significantly less pruritus. Targretin was continued. She was last seen in an office visit on 03/20/2013. Targretin was continued at 150 mg daily.  Weight changes, if any, over the past 6 months: no  Recurrent fevers, or drenching night sweats, if any: no  SAFETY ISSUES:  Prior radiation? No  Pacemaker/ICD? no  Possible current pregnancy? No  Is the patient on methotrexate? No  Current Complaints / other details:   Multiple cutaneous lesions at the anterior chest and left posterolateral chest wall  Above-the-knee amputation due to a non-healing wound on her right lateral malleolus with developement of osteomyelitis   On December 03, 2012, she was readmitted with acute stroke.  Chronic Atrial Fibrillation and chronic CHF  Previous MI  [place] Police here with her daughter for consult.  She reports that she is currently having itching and peeling on her left wrist.  She also had pain in her left shin yesterday.  Her daughter was wondering if it would be OK for her to receive a shingles vaccine.

## 2013-05-17 ENCOUNTER — Encounter: Payer: Self-pay | Admitting: Radiation Oncology

## 2013-05-17 ENCOUNTER — Ambulatory Visit
Admission: RE | Admit: 2013-05-17 | Discharge: 2013-05-17 | Disposition: A | Discharge status: Home / Self Care | Payer: Medicare Other | Source: Ambulatory Visit | Attending: Radiation Oncology | Admitting: Radiation Oncology

## 2013-05-17 VITALS — BP 140/57 | HR 67 | Temp 98.9°F | Ht 66.5 in

## 2013-05-17 DIAGNOSIS — E785 Hyperlipidemia, unspecified: Secondary | ICD-10-CM | POA: Insufficient documentation

## 2013-05-17 DIAGNOSIS — I10 Essential (primary) hypertension: Secondary | ICD-10-CM | POA: Insufficient documentation

## 2013-05-17 DIAGNOSIS — I509 Heart failure, unspecified: Secondary | ICD-10-CM | POA: Insufficient documentation

## 2013-05-17 DIAGNOSIS — C8404 Mycosis fungoides, lymph nodes of axilla and upper limb: Secondary | ICD-10-CM | POA: Insufficient documentation

## 2013-05-17 DIAGNOSIS — Z8673 Personal history of transient ischemic attack (TIA), and cerebral infarction without residual deficits: Secondary | ICD-10-CM | POA: Insufficient documentation

## 2013-05-17 DIAGNOSIS — Z79899 Other long term (current) drug therapy: Secondary | ICD-10-CM | POA: Insufficient documentation

## 2013-05-17 NOTE — Addendum Note (Signed)
Encounter addended by: Maryln Gottron, MD on: 05/17/2013  1:23 PM<BR>     Documentation filed: Normajean Glasgow VN

## 2013-05-17 NOTE — Progress Notes (Signed)
Please see the Nurse Progress Note in the MD Initial Consult Encounter for this patient. 

## 2013-05-17 NOTE — Progress Notes (Signed)
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    HISTORY OF PRESENT ILLNESS:  Sara [name] is a 79 y.o. female who is seen today for the courtesy Dr. Cyndie Chime for discussion of possible radiation therapy in the palliative management of her mycosis fungoides. She presented in July 2013 with history of a pruritic rash involving most of her trunk and extremities along with desquamation. She been treated for psoriasis. She's had a leukocytosis and there was the initial concern of CLL. Flow cytometry suggests a T-cell lymphoproliferative process with a predominance of CD4 positive cells. Her staging evaluation did not show any gross adenopathy or organomegaly. She was started on low-dose Targretin this past January with improvement of her pruritus. Over the past few months she has noted development of multiple cutaneous lesions, primarily on the anterior chest between her breasts. Dr. Cyndie Chime inquired about possible electron beam radiation therapy.  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 (05/29/2012); Hyperlipemia; Pneumonia; Wound of right leg (09/25/2012); Stroke; Hyposmolality and/or hyponatremia; Lower limb amputation, above knee (10/11/2012);  Pain in joint, multiple sites; Mycosis fungoides, unspecified site, extranodal and solid organ sites (09/28/2012); Occlusion and stenosis of carotid artery without mention of cerebral infarction (09/28/2012); Chronic venous hypertension with ulcer and inflammation (09/28/2012); Diverticulosis of colon (without mention of hemorrhage) (09/28/2012); Synovial cyst of popliteal space (03/26/2012); Unspecified constipation (03/24/2012); Hypopotassemia (01/26/2012); Debility, unspecified (01/17/2012); Anxiety state, unspecified (12/31/2011); Anemia, unspecified (12/21/2011); Other specified disease of white blood cells (12/21/2011); Peripheral vascular disease, unspecified (12/21/2011); Reflux esophagitis (12/21/2011); Unspecified hereditary and idiopathic peripheral neuropathy (12/14/2011); Pain in joint, ankle and foot (12/14/2011); Carotid stenosis; Pre-syncope; TIA (transient ischemic attack); and Paroxysmal atrial fibrillation.     PAST SURGICAL HISTORY:  Past Surgical History  Procedure Laterality Date  . Tonsillectomy    . Dilation and curettage of uterus    . Eye surgery    . Cataracts    . Vertebroplasty    . Amputation  10/10/2012    Procedure: AMPUTATION ABOVE KNEE;  Surgeon: Nadara Mustard, MD;  Location: [facility] OR;  Service: Orthopedics;  Laterality: Right;  . Percutaneous stent intervention  04/13/2011    Left internal carotid artery stented with a 8x30 prcise nitinol self-expanding stent resulting reduction of 90% to less than 20% residual  . Cardiac catheterization  11/06/2010    Recommended CABG     FAMILY HISTORY: family history includes Cirrhosis in her brother; Diabetes in her brother and sister; Heart attack in her paternal grandmother; Heart failure in her sister and sister; Pneumonia in her father; Stomach cancer in her mother.   SOCIAL HISTORY:  reports that she has never smoked. She has never used smokeless tobacco. She reports that she does not drink alcohol or use illicit drugs. Widowed  for at least 5-6  years. 2 children. 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   ALLERGIES: Evista; Penicillins; Triamterene; and Alendronate sodium   MEDICATIONS:  Current Outpatient Prescriptions  Medication Sig Dispense Refill  . atorvastatin (LIPITOR) 40 MG tablet Take 1 tablet (40 mg total) by mouth daily at 6 PM.      . bexarotene (TARGRETIN) 75 MG CAPS capsule Take 150 mg by mouth daily before supper. Give with food. Protect from light. CAUTION: Chemotherapy/Biotherapy      . carboxymethylcellulose (REFRESH PLUS) 0.5 % SOLN 1 drop 4 (four) times daily. Both eyes      . carvedilol (COREG) 25 MG tablet Take 25 mg by mouth 2 (two) times daily with a meal.       . dabigatran (PRADAXA) 150 MG CAPS Take 75 mg by mouth every 12 (twelve) hours.       Marland Kitchen diltiazem (CARDIZEM CD) 120 MG 24 hr capsule Take 120 mg by mouth daily. Take 1 tablet once daily for BP.      . famotidine (PEPCID) 20 MG tablet Take 40 mg by mouth daily.      . folic acid (FOLVITE) 1 MG tablet Take 1 mg by mouth daily.      . furosemide (LASIX) 40 MG tablet Take 1 tablet (40 mg total) by mouth 2 (two) times daily.  30 tablet  0  . gabapentin (NEURONTIN) 100 MG capsule Take 1 capsule (100 mg total) by mouth at bedtime.      Marland Kitchen lisinopril (PRINIVIL,ZESTRIL) 5 MG tablet Take 10 mg by mouth daily. Take 1 tablet daily for blood pressure and protect kidneys.      . nitroGLYCERIN (NITROSTAT) 0.4 MG SL tablet Place 0.4 mg under the tongue every 5 (five) minutes as needed for chest pain.       . Omega-3 Fatty Acids (FISH OIL) 1000 MG CAPS Take 1,000 mg by mouth at bedtime.       . polyethylene glycol (MIRALAX / GLYCOLAX) packet Take 17 g by mouth daily. Take 1 packet mixed in 4 oz of beverage every day to relieve constipation.      . Polyvinyl Alcohol-Povidone (REFRESH OP) Apply 1 drop to eye 4 (four) times daily.      . vitamin C (ASCORBIC ACID) 500 MG tablet Take 500 mg by  mouth daily.      . ferrous sulfate 325 (65 FE) MG tablet Take 325 mg by mouth 2 (two) times daily. Take one tablet twice daily for iron supplement.      Marland Kitchen levocetirizine (XYZAL) 5 MG tablet Take 5 mg by mouth at bedtime as needed.        No current facility-administered medications for this encounter.     REVIEW OF SYSTEMS:  Pertinent items are noted in HPI.    PHYSICAL EXAM:  height is 5' 6.5" (1.689 m). Her temperature is 98.9 F (37.2 C). Her blood pressure is 140/57 and her pulse is 67.   Alert and oriented 79 year old white female appearing her stated age. There is diffuse erythema along her entire trunk, face, and extremities. Nodes: There few small nodes within the left axilla the largest measuring 1.5-2.0 cm. There is a 2.5 cm node within the right axilla with a few other smaller nodes less than 1 cm. Chest: There is diffuse erythema along the entire chest/tongue. There are numerous clear papules along the inferior sternal region the largest of which measures  approximately 6-7 mm in greatest diameter. These papules extend along the upper anterior chest where they are less dense. There is patchy dry desquamation along the entire trunk and extremities. She has a right AKA.   LABORATORY DATA:  Lab Results  Component Value Date   WBC 25.8* 05/08/2013   HGB 12.8 05/08/2013   HCT 38.9 05/08/2013   MCV 92.6 05/08/2013   PLT 307 05/08/2013   Lab Results  Component Value Date   NA 140 05/08/2013   K 3.4* 05/08/2013   CL 97 01/23/2013   CO2 28 05/08/2013   Lab Results  Component Value Date   ALT 12 05/08/2013   AST 16 05/08/2013   ALKPHOS 64 05/08/2013   BILITOT 0.27 05/08/2013      IMPRESSION: Ecchymosis from core/cutaneous T-cell lymphoma. It is unclear to me as whether not the anterior chest papules represent an early "tumor stage" of her disease. In any case, I feel that she can be observed since these papules are essentially asymptomatic. I generally reserve radiation therapy for  symptomatic disease and/or tumor drainage.   PLAN: I'll see her back for a followup visit in approximately 3 months. She'll continue with her systemic therapy for now.  I spent 30 minutes minutes face to face with the patient and more than 50% of that time was spent in counseling and/or coordination of care.

## 2013-05-30 ENCOUNTER — Non-Acute Institutional Stay (SKILLED_NURSING_FACILITY): Payer: Medicare Other | Admitting: Nurse Practitioner

## 2013-05-30 DIAGNOSIS — I5032 Chronic diastolic (congestive) heart failure: Secondary | ICD-10-CM

## 2013-05-30 DIAGNOSIS — K5900 Constipation, unspecified: Secondary | ICD-10-CM

## 2013-05-30 DIAGNOSIS — C8404 Mycosis fungoides, lymph nodes of axilla and upper limb: Secondary | ICD-10-CM

## 2013-05-30 DIAGNOSIS — I10 Essential (primary) hypertension: Secondary | ICD-10-CM

## 2013-05-30 DIAGNOSIS — I4891 Unspecified atrial fibrillation: Secondary | ICD-10-CM

## 2013-05-30 DIAGNOSIS — G547 Phantom limb syndrome without pain: Secondary | ICD-10-CM

## 2013-05-30 DIAGNOSIS — G546 Phantom limb syndrome with pain: Secondary | ICD-10-CM

## 2013-05-30 NOTE — Progress Notes (Signed)
Patient ID: Sara [name], female   DOB: [birth year], 79 y.o.   MRN: [id number]  Chief Complaint:  Chief Complaint  Patient presents with  . Medical Managment of Chronic Issues     HPI:   Problem List Items Addressed This Visit   Atrial fibrillation with RVR - Primary     Presented heart rate controlled on Cardizem 120mg  and Carvedilol 25mg  bid, takes Pradaxa 75mg  bid          Chronic diastolic HF (heart failure) (Chronic)     Takes Furosemide 40mg  bid, Carvedilol 25mg  bid, clinically compensated. CMP monthly.         HTN (hypertension)     Controlled on Lisinopril 10mg  along with Cardizem and lasix and Carvedilol with mild elevated Sbp in 140s.           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 Cyndie Chime. Currently takes Targretin 75mg  II  daily ac supper. Monthly CBC, CMP, TSH, triglycerides.         Phantom limb pain (Chronic)     Right AKA--managed with Gabapentin 100mg  nightly.         Unspecified constipation     Managed with MiraLax daily           Review of Systems:  Review of Systems  Constitutional: Negative for fever, chills, weight loss, malaise/fatigue and diaphoresis.  HENT: Positive for hearing loss. Negative for ear pain, congestion, sore throat, neck pain and ear discharge.   Eyes: Negative for pain, discharge and redness.  Respiratory: Positive for cough. Negative for sputum production, shortness of breath and wheezing.   Cardiovascular: Negative for chest pain, palpitations, orthopnea, claudication, leg swelling and PND.  Gastrointestinal: Negative for heartburn, nausea, vomiting, abdominal pain, diarrhea, constipation and blood in stool.  Genitourinary: Positive for frequency (incontinent of bladder). Negative for dysuria, urgency, hematuria and flank pain.  Musculoskeletal: Positive for joint pain. Negative for myalgias, back pain and falls.  Skin: Positive for itching and rash.       Left  temporal area treated SCC with 5-FU. New skin tear LLE  Neurological: Positive for weakness (generalized). Negative for dizziness, tingling, tremors, sensory change, speech change, focal weakness, seizures, loss of consciousness and headaches.  Endo/Heme/Allergies: Negative for environmental allergies and polydipsia. Bruises/bleeds easily.  Psychiatric/Behavioral: Positive for memory loss. Negative for depression and hallucinations. The patient is not nervous/anxious and does not have insomnia.      Medications: Patient's Medications  New Prescriptions   No medications on file  Previous Medications   ATORVASTATIN (LIPITOR) 40 MG TABLET    Take 1 tablet (40 mg total) by mouth daily at 6 PM.   BEXAROTENE (TARGRETIN) 75 MG CAPS CAPSULE    Take 150 mg by mouth daily before supper. Give with food. Protect from light. CAUTION: Chemotherapy/Biotherapy   CARBOXYMETHYLCELLULOSE (REFRESH PLUS) 0.5 % SOLN    1 drop 4 (four) times daily. Both eyes   CARVEDILOL (COREG) 25 MG TABLET    Take 25 mg by mouth 2 (two) times daily with a meal.    DABIGATRAN (PRADAXA) 150 MG CAPS    Take 75 mg by mouth every 12 (twelve) hours.    DILTIAZEM (CARDIZEM CD) 120 MG 24 HR CAPSULE    Take 120 mg by mouth daily. Take 1 tablet once daily for BP.   FAMOTIDINE (PEPCID) 20 MG TABLET    Take 40 mg by mouth daily.   FERROUS SULFATE 325 (65  FE) MG TABLET    Take 325 mg by mouth 2 (two) times daily. Take one tablet twice daily for iron supplement.   FOLIC ACID (FOLVITE) 1 MG TABLET    Take 1 mg by mouth daily.   FUROSEMIDE (LASIX) 40 MG TABLET    Take 1 tablet (40 mg total) by mouth 2 (two) times daily.   GABAPENTIN (NEURONTIN) 100 MG CAPSULE    Take 1 capsule (100 mg total) by mouth at bedtime.   LEVOCETIRIZINE (XYZAL) 5 MG TABLET    Take 5 mg by mouth at bedtime as needed.    LISINOPRIL (PRINIVIL,ZESTRIL) 5 MG TABLET    Take 10 mg by mouth daily. Take 1 tablet daily for blood pressure and protect kidneys.   NITROGLYCERIN  (NITROSTAT) 0.4 MG SL TABLET    Place 0.4 mg under the tongue every 5 (five) minutes as needed for chest pain.    OMEGA-3 FATTY ACIDS (FISH OIL) 1000 MG CAPS    Take 1,000 mg by mouth at bedtime.    POLYETHYLENE GLYCOL (MIRALAX / GLYCOLAX) PACKET    Take 17 g by mouth daily. Take 1 packet mixed in 4 oz of beverage every day to relieve constipation.   POLYVINYL ALCOHOL-POVIDONE (REFRESH OP)    Apply 1 drop to eye 4 (four) times daily.   VITAMIN C (ASCORBIC ACID) 500 MG TABLET    Take 500 mg by mouth daily.  Modified Medications   No medications on file  Discontinued Medications   No medications on file     Physical Exam: Physical Exam  Constitutional: She is oriented to person, place, and time. She appears well-developed and well-nourished. No distress.  HENT:  Head: Normocephalic.  Right Ear: External ear normal.  Left Ear: External ear normal.  Nose: Nose normal.  Mouth/Throat: Oropharynx is clear and moist. No oropharyngeal exudate.  Eyes: EOM are normal. Pupils are equal, round, and reactive to light. Right conjunctiva is injected.  Only a few eyelashes remained and right conjunctiva mild irritated with bilateral lower eyelid eversion.   Neck: Normal range of motion. Neck supple. JVD present. No thyromegaly present.  Cardiovascular: Normal rate, regular rhythm and normal heart sounds.  Exam reveals decreased pulses.   No murmur heard. Pulses:      Dorsalis pedis pulses are 1+ on the left side.       Posterior tibial pulses are 1+ on the left side.  Pulmonary/Chest: Effort normal and breath sounds normal. No respiratory distress. She has no wheezes. She exhibits no tenderness.  Abdominal: Soft. Bowel sounds are normal. She exhibits no distension. There is no tenderness.  Genitourinary:  Incontinent of bladder  Musculoskeletal: Normal range of motion. She exhibits no edema and no tenderness.  R AKA  Lymphadenopathy:    She has no cervical adenopathy.  Neurological: She is alert  and oriented to person, place, and time. She has normal reflexes. No cranial nerve deficit. She exhibits normal muscle tone. Coordination normal.  Skin: Rash noted. There is erythema.  Diffused erythematous scaly skin appearance. Psoriasis.  New hematoma at the left shin is not infected.   Psychiatric: She has a normal mood and affect. Her behavior is normal. Judgment and thought content normal. She exhibits abnormal recent memory.     Filed Vitals:   05/31/13 1634  BP: 126/82  Pulse: 78  Temp: 98.4 F (36.9 C)  TempSrc: Tympanic  Resp: 20      Labs reviewed: Basic Metabolic Panel:  Recent Labs  16/10/96  2318 07/23/12 0645  12/03/12 1407 12/03/12 1421  12/14/12 01/23/13 0720 02/06/13 05/04/13 05/08/13 1420  NA 142 143  < > 137 136  < > 138 135 137 136* 140  K 4.2 3.6  < > 4.2 4.1  --  4.2 4.2 4.2 4.0 3.4*  CL 105 108  < > 96 99  --   --  97  --   --   --   CO2  --  27  < > 29  --   --   --  28  --   --  28  GLUCOSE 126* 97  < > 94 95  --   --  83  --   --  155*  BUN 25* 20  < > 25* 26*  < > 20 15 16 12  10.9  CREATININE 1.10 0.87  < > 0.58 0.70  < > 0.5 0.56 0.5 0.6 0.8  CALCIUM  --  9.2  < > 9.2  --   --   --  9.0  --   --  9.3  MG  --  2.1  --   --   --   --   --   --   --   --   --   PHOS  --  3.5  --   --   --   --   --   --   --   --   --   TSH  --  1.787  < >  --   --   --   --   --  0.60 0.88  --   < > = values in this interval not displayed.  Liver Function Tests:  Recent Labs  12/03/12 1407  01/23/13 0720 02/06/13 05/04/13 05/08/13 1420  AST 16  < > 14 13 14 16   ALT 11  < > 8 9 9 12   ALKPHOS 60  < > 53 48 50 64  BILITOT 0.2*  --  0.3  --   --  0.27  PROT 6.4  --  5.9*  --   --  6.9  ALBUMIN 3.3*  --  3.5  --   --  3.2*  < > = values in this interval not displayed.  CBC:  Recent Labs  11/02/12 0530  12/03/12 1407  12/04/12 0820  02/06/13 05/04/13 05/08/13 1420  WBC 25.5*  < > 26.9*  --  26.7*  < > 24.2 24.8 25.8*  NEUTROABS 9.2*  --  7.8*  --    --   --   --   --  6.6*  HGB 11.8*  < > 12.8  < > 13.1  < > 12.3 11.7* 12.8  HCT 35.1*  < > 37.3  < > 38.7  < > 36 35* 38.9  MCV 86.2  < > 87.4  --  86.6  --   --   --  92.6  PLT 323  < > 290  --  306  < > 310 306 307  < > = values in this interval not displayed.   Significant Diagnostic Results:  12/22/12 CXR no evidence of acute infiltrate or residual infiltrate    Assessment/Plan Atrial fibrillation with RVR Presented heart rate controlled on Cardizem 120mg  and Carvedilol 25mg  bid, takes Pradaxa 75mg  bid        Chronic diastolic HF (heart failure) Takes Furosemide 40mg  bid, Carvedilol 25mg  bid, clinically compensated. CMP monthly.  Phantom limb pain Right AKA--managed with Gabapentin 100mg  nightly.       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 Cyndie Chime. Currently takes Targretin 75mg  II  daily ac supper. Monthly CBC, CMP, TSH, triglycerides.       Unspecified constipation Managed with MiraLax daily      HTN (hypertension) Controlled on Lisinopril 10mg  along with Cardizem and lasix and Carvedilol with mild elevated Sbp in 140s.             Family/ staff Communication: skin care.    Goals of care: SNF and may ambulate with her prosthesis.    Labs/tests ordered CBC, CMP, TSH, Free T4, Triglycerides monthly.

## 2013-05-31 ENCOUNTER — Encounter: Payer: Self-pay | Admitting: Nurse Practitioner

## 2013-05-31 NOTE — Assessment & Plan Note (Signed)
Controlled on Lisinopril 10mg  along with Cardizem and lasix and Carvedilol with mild elevated Sbp in 140s.

## 2013-05-31 NOTE — Assessment & Plan Note (Signed)
Right AKA--managed with Gabapentin 100mg nightly 

## 2013-05-31 NOTE — Assessment & Plan Note (Addendum)
Presented heart rate controlled on Cardizem 120mg  and Carvedilol 25mg  bid, takes Pradaxa 75mg  bid

## 2013-05-31 NOTE — Assessment & Plan Note (Signed)
Managed with MiraLax daily 

## 2013-05-31 NOTE — Assessment & Plan Note (Signed)
F/u Dr. Cyndie Chime. Currently takes Targretin 75mg  II  daily ac supper. Monthly CBC, CMP, TSH, triglycerides.

## 2013-05-31 NOTE — Assessment & Plan Note (Signed)
Takes Furosemide 40mg  bid, Carvedilol 25mg  bid, clinically compensated. CMP monthly.

## 2013-06-01 ENCOUNTER — Telehealth: Payer: Self-pay | Admitting: *Deleted

## 2013-06-01 NOTE — Telephone Encounter (Signed)
Tywan from [facility] is calling to notify us that per their pharmacy Targretin is being discontinued by the pharmaceutical company.  The patient has #14 left - enough for one week.  She is asking, does Dr. Cyndie Chime know about this?  Does he want to order a different medication?   Will discuss with our pharmacist,  Concha Norway and Dr. Cyndie Chime.

## 2013-06-01 NOTE — Telephone Encounter (Signed)
Spoke with Sara [name] at [facility]	[place].  [employer] is in process on contracting for this drug.  Check with him the beginning of next week and see what he can find out about where they are in this process,.

## 2013-06-05 ENCOUNTER — Telehealth: Payer: Self-pay | Admitting: *Deleted

## 2013-06-05 NOTE — Telephone Encounter (Signed)
Trudie Reed called requesting hard copy of script for Targretin.   Copy placed in envelope and given to Wilma at check-in.  Message left for Jola Babinski [phone] that script was available to pick up as she requested.

## 2013-06-08 ENCOUNTER — Encounter: Payer: Self-pay | Admitting: Oncology

## 2013-06-11 LAB — BASIC METABOLIC PANEL
BUN: 11 mg/dL (ref 4–21)
Sodium: 142 mmol/L (ref 137–147)

## 2013-06-11 LAB — CBC AND DIFFERENTIAL
HCT: 36 % (ref 36–46)
Hemoglobin: 12.1 g/dL (ref 12.0–16.0)
Platelets: 252 10*3/uL (ref 150–399)
WBC: 28.5 10^3/mL

## 2013-06-11 LAB — HEPATIC FUNCTION PANEL
ALT: 10 U/L (ref 7–35)
AST: 15 U/L (ref 13–35)
Bilirubin, Total: 0.4 mg/dL

## 2013-06-11 LAB — TSH: TSH: 2.91 u[IU]/mL (ref 0.41–5.90)

## 2013-06-11 LAB — LIPID PANEL: Triglycerides: 201 mg/dL — AB (ref 40–160)

## 2013-06-12 ENCOUNTER — Ambulatory Visit (HOSPITAL_BASED_OUTPATIENT_CLINIC_OR_DEPARTMENT_OTHER): Payer: Medicare Other | Admitting: Oncology

## 2013-06-12 ENCOUNTER — Telehealth: Payer: Self-pay | Admitting: Oncology

## 2013-06-12 ENCOUNTER — Other Ambulatory Visit (HOSPITAL_BASED_OUTPATIENT_CLINIC_OR_DEPARTMENT_OTHER): Payer: Medicare Other | Admitting: Lab

## 2013-06-12 VITALS — BP 182/73 | HR 58 | Temp 97.1°F | Resp 20 | Ht 66.5 in

## 2013-06-12 DIAGNOSIS — I2510 Atherosclerotic heart disease of native coronary artery without angina pectoris: Secondary | ICD-10-CM

## 2013-06-12 DIAGNOSIS — C8404 Mycosis fungoides, lymph nodes of axilla and upper limb: Secondary | ICD-10-CM

## 2013-06-12 DIAGNOSIS — I10 Essential (primary) hypertension: Secondary | ICD-10-CM

## 2013-06-12 DIAGNOSIS — L408 Other psoriasis: Secondary | ICD-10-CM

## 2013-06-12 DIAGNOSIS — I4891 Unspecified atrial fibrillation: Secondary | ICD-10-CM

## 2013-06-12 LAB — CBC WITH DIFFERENTIAL/PLATELET
Eosinophils Absolute: 0.7 10*3/uL — ABNORMAL HIGH (ref 0.0–0.5)
HCT: 39.3 % (ref 34.8–46.6)
LYMPH%: 67.6 % — ABNORMAL HIGH (ref 14.0–49.7)
MONO#: 1 10*3/uL — ABNORMAL HIGH (ref 0.1–0.9)
NEUT#: 8.2 10*3/uL — ABNORMAL HIGH (ref 1.5–6.5)
NEUT%: 26.2 % — ABNORMAL LOW (ref 38.4–76.8)
Platelets: 253 10*3/uL (ref 145–400)
WBC: 31.1 10*3/uL — ABNORMAL HIGH (ref 3.9–10.3)

## 2013-06-12 LAB — COMPREHENSIVE METABOLIC PANEL (CC13)
CO2: 29 mEq/L (ref 22–29)
Calcium: 9.4 mg/dL (ref 8.4–10.4)
Chloride: 102 mEq/L (ref 98–109)
Creatinine: 0.7 mg/dL (ref 0.6–1.1)
Glucose: 115 mg/dl (ref 70–140)
Sodium: 142 mEq/L (ref 136–145)
Total Bilirubin: 0.29 mg/dL (ref 0.20–1.20)
Total Protein: 7.1 g/dL (ref 6.4–8.3)

## 2013-06-12 LAB — MORPHOLOGY

## 2013-06-12 NOTE — Telephone Encounter (Signed)
Gave pt appt for lab and ML on November 2014 °

## 2013-06-13 NOTE — Progress Notes (Signed)
Hematology and Oncology Follow Up Visit  SEMIRA STOLTZFUS [id number] 04/20/1919 79 y.o. 06/13/2013 7:16 PM   Principle Diagnosis: Encounter Diagnosis  Name Primary?  . 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 It has now been one year since diagnosis in July 2013. A trial Targretin was started in January 2014. She had a number of major medical problems over the last 6 months detailed in my progress note dated 12/19/2012 most dramatic of which was necessity for a right above the knee amputation for osteomyelitis.  The Targretin was initially started a dose of 75 mg daily. Dose was escalated at the end of March to 150 mg. She has tolerated the drug well. We have monitored renal function, liver function, thyroid function and lipid profile as well as her CBC. She has a persistent leukocytosis with absolute lymphocytosis but total white count has remained stable. Hemoglobin has remained stable. Platelet count has remained normal. She has minimal adenopathy exam limited to her axillae. No central adenopathy or splenomegaly on CT scans. At time of my 03/20/2013 visit I felt that she was showing a moderate response to the drug. There was a significant decrease in erythema and desquamation. At time of her visit on August 26, multiple new papules noted on the skin of her trunk. Due to her age and now handicapped status, she did not want to go to [facility] for an evaluation for electron beam radiation therapy. She was therefore evaluated here in [facility] by Dr. Chipper Herb. He didn't think that the chest papules were significant enough to initiate a course of radiation. He will continue to follow the patient and will see her again in a few months.    Medications: reviewed  Allergies:  Allergies  Allergen Reactions  . Evista [Raloxifene]   . Penicillins Other (See Comments)   unknown  . Triamterene Other (See Comments)    unknown  . Alendronate Sodium Rash    Pt stated oh k to use biotene    Review of Systems: Constitutional:   No anorexia, fever, weight loss HEENT no sore throat Respiratory: No cough or dyspnea Cardiovascular:  No chest pain or palpitations Gastrointestinal: No abdominal pain no change in bowel habit Genito-Urinary: No urinary tract symptoms Musculoskeletal: No new muscle bone or joint pain. Neurologic: No headache or change in vision Skin: She notes an improvement in her skin  Remaining ROS negative.    Physical Exam: Blood pressure 182/73, pulse 58, temperature 97.1 F (36.2 C), temperature source Oral, resp. rate 20, height 5' 6.5" (1.689 m). Wt Readings from Last 3 Encounters:  01/16/13 131 lb 14.4 oz (59.829 kg)  12/19/12 124 lb (56.246 kg)  12/06/12 119 lb 4.8 oz (54.114 kg)     General appearance: Frail elderly woman HENNT: Pharynx no erythema exudate or mass, no thyromegaly Lymph nodes:  A persistent axillary lymphadenopathy left greater than right Breasts: Lungs: Clear to auscultation resonant to percussion Heart: Regular rhythm no murmur Abdomen: Soft, nontender Extremities: No edema, no calf tenderness on the left. Right BKA Musculoskeletal: No joint deformities GU: Vascular: No cyanosis Neurologic: Grossly normal Skin: Papules noted at time of August 26 visits have resolved. There is less erythema on her anterior chest and upper back. Still significant erythema of her lower back extending to the buttocks. Resolving desquamation palms of the hands and sole of her left foot.  Lab Results: CBC W/Diff  White count differential: 26 neutrophils, 68 lymphocytes comparable to previous values   Component Value Date/Time   WBC 31.1* 06/12/2013 1520   WBC 24.8 05/04/2013   WBC 26.7* 12/04/2012 0820   RBC 4.22 06/12/2013 1520   RBC 4.47 12/04/2012 0820   RBC 3.54* 12/08/2011 0605   HGB 13.0 06/12/2013 1520   HGB 11.7*  05/04/2013   HCT 39.3 06/12/2013 1520   HCT 35* 05/04/2013   PLT 253 06/12/2013 1520   PLT 306 05/04/2013   MCV 93.1 06/12/2013 1520   MCV 86.6 12/04/2012 0820   MCH 30.9 06/12/2013 1520   MCH 29.3 12/04/2012 0820   MCHC 33.2 06/12/2013 1520   MCHC 33.9 12/04/2012 0820   RDW 12.7 06/12/2013 1520   RDW 16.0* 12/04/2012 0820   LYMPHSABS 21.0* 06/12/2013 1520   LYMPHSABS 16.7* 12/03/2012 1407   MONOABS 1.0* 06/12/2013 1520   MONOABS 1.3* 12/03/2012 1407   EOSABS 0.7* 06/12/2013 1520   EOSABS 1.1* 12/03/2012 1407   BASOSABS 0.3* 06/12/2013 1520   BASOSABS 0.0 12/03/2012 1407     Chemistry      Component Value Date/Time   NA 142 06/12/2013 1520   NA 136* 05/04/2013   NA 135 01/23/2013 0720   K 3.6 06/12/2013 1520   K 4.0 05/04/2013   CL 97 01/23/2013 0720   CL 102 09/25/2012 1002   CO2 29 06/12/2013 1520   CO2 28 01/23/2013 0720   BUN 9.7 06/12/2013 1520   BUN 12 05/04/2013   BUN 15 01/23/2013 0720   CREATININE 0.7 06/12/2013 1520   CREATININE 0.6 05/04/2013   CREATININE 0.56 01/23/2013 0720   CREATININE 0.70 12/03/2012 1421   GLU 90 05/04/2013      Component Value Date/Time   CALCIUM 9.4 06/12/2013 1520   CALCIUM 9.0 01/23/2013 0720   ALKPHOS 68 06/12/2013 1520   ALKPHOS 50 05/04/2013   AST 19 06/12/2013 1520   AST 14 05/04/2013   ALT 12 06/12/2013 1520   ALT 9 05/04/2013   BILITOT 0.29 06/12/2013 1520   BILITOT 0.3 01/23/2013 0720       Impression: #1. Mycosis fungoides  She appears to be responding to low-dose Targretin.  I will continue her at the current dose of 150 mg daily. Continue to monitor on an every 4-6 week basis. Continue monthly lab evaluations.  Unfortunately, despite partial insurance coverage, she still has paid 500,000 month out-of-pocket for the drug. She will now also be followed by our radiation oncologist and he be given local radiation if and when indicated.  #2. Long-standing psoriasis  .  #3. Nonhealing wound right lateral malleolus with development of osteomyelitis requiring  recent right AKA..   #4. Chronic atrial fibrillation with recent embolic stroke. She was on aspirin. Pradaxa was started. In view of her advanced age, I am decreasing the dose to 75 mg twice daily.   #5. Essential hypertension   #6. Coronary artery disease with previous MI; Chronic congestive heart failure with recent decompensation requiring hospitalization in November   :   Levert Feinstein, MD 10/1/20147:16 PM

## 2013-06-15 ENCOUNTER — Telehealth: Payer: Self-pay | Admitting: Oncology

## 2013-06-15 NOTE — Telephone Encounter (Signed)
Sent office notes to Dr.Elise [facility] office at [facility].

## 2013-06-20 ENCOUNTER — Telehealth (HOSPITAL_COMMUNITY): Payer: Self-pay | Admitting: *Deleted

## 2013-06-20 ENCOUNTER — Telehealth: Payer: Self-pay | Admitting: Cardiovascular Disease

## 2013-06-20 NOTE — Telephone Encounter (Signed)
Pt is having tooth extracted on October 16.  Dr. Egbert Garibaldi, DDS [phone]) needs Dr. Allyson Sabal to clear her for the surgery. Patient is in skilled care at [facility] at [facility] [phone]) and they will need to know what medicines to hold prior to appointment.

## 2013-06-20 NOTE — Telephone Encounter (Signed)
SENT TO DR Allyson Sabal

## 2013-06-21 ENCOUNTER — Non-Acute Institutional Stay (SKILLED_NURSING_FACILITY): Payer: Medicare Other | Admitting: Nurse Practitioner

## 2013-06-21 DIAGNOSIS — R609 Edema, unspecified: Secondary | ICD-10-CM

## 2013-06-21 DIAGNOSIS — I5032 Chronic diastolic (congestive) heart failure: Secondary | ICD-10-CM

## 2013-06-21 DIAGNOSIS — R600 Localized edema: Secondary | ICD-10-CM

## 2013-06-21 DIAGNOSIS — I4891 Unspecified atrial fibrillation: Secondary | ICD-10-CM

## 2013-06-21 DIAGNOSIS — C8404 Mycosis fungoides, lymph nodes of axilla and upper limb: Secondary | ICD-10-CM

## 2013-06-21 DIAGNOSIS — I10 Essential (primary) hypertension: Secondary | ICD-10-CM

## 2013-06-21 DIAGNOSIS — D649 Anemia, unspecified: Secondary | ICD-10-CM

## 2013-06-21 NOTE — Progress Notes (Signed)
Patient ID: Sara [name], female   DOB: August 28, 1919, 79 y.o.   MRN: [id number]  Chief Complaint:  Chief Complaint  Patient presents with  . Medical Managment of Chronic Issues    LLE edema.      HPI:  Problem List Items Addressed This Visit   Anemia     Takes Fe 325mg  bid and Folic acid 1mg  Hgb 12.3 02/06/13 and serum iron 54 02/20/13          Atrial fibrillation with RVR     Presented heart rate controlled on Cardizem 120mg  and Carvedilol 25mg  bid, takes Pradaxa 75mg  bid            Chronic diastolic HF (heart failure) (Chronic)     Takes Furosemide 40mg  bid, Carvedilol 25mg  bid, clinically compensated. CMP monthly.           Hypertension, uncontrolled on adm 03/20/12     Controlled on Lisinopril 10mg  along with Cardizem and lasix and Carvedilol with mild elevated Sbp in 140s.             Leg edema, left - Primary     Worsened in the middle of the left lower leg, no pain or fever/coolness noted or cord like mass noted, weak pedal pulse. Will obtain BNP given chronic CHF, ABI of the left given AKA due to severe arterial disease, Venous US to r/o DVT, update CBC/BMP, VS qshift, weight daily in am to monitor.     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 Cyndie Chime. Currently takes Targretin 75mg  II  daily ac supper. Monthly CBC, CMP, TSH, triglycerides.              Review of Systems:  Review of Systems  Constitutional: Negative for fever, chills, weight loss, malaise/fatigue and diaphoresis.  HENT: Positive for hearing loss. Negative for congestion, ear discharge, ear pain and sore throat.   Eyes: Negative for pain, discharge and redness.  Respiratory: Positive for cough. Negative for sputum production, shortness of breath and wheezing.   Cardiovascular: Positive for leg swelling. Negative for chest pain, palpitations, orthopnea, claudication and PND.  Gastrointestinal: Negative for heartburn, nausea, vomiting,  abdominal pain, diarrhea, constipation and blood in stool.  Genitourinary: Positive for frequency (incontinent of bladder). Negative for dysuria, urgency, hematuria and flank pain.  Musculoskeletal: Positive for joint pain. Negative for back pain, falls, myalgias and neck pain.  Skin: Positive for itching and rash.       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 New skin tear LLE  Neurological: Positive for weakness (generalized). Negative for dizziness, tingling, tremors, sensory change, speech change, focal weakness, seizures, loss of consciousness and headaches.  Endo/Heme/Allergies: Negative for environmental allergies and polydipsia. Bruises/bleeds easily.  Psychiatric/Behavioral: Positive for memory loss. Negative for depression and hallucinations. The patient is not nervous/anxious and does not have insomnia.      Medications: Reviewed at [facility]    Physical Exam: Physical Exam  Constitutional: She is oriented to person, place, and time. She appears well-developed and well-nourished. No distress.  HENT:  Head: Normocephalic.  Right Ear: External ear normal.  Left Ear: External ear normal.  Nose: Nose normal.  Mouth/Throat: Oropharynx is clear and moist. No oropharyngeal exudate.  Eyes: EOM are normal. Pupils are equal, round, and reactive to light. Right conjunctiva is injected.  Only a few eyelashes remained and right conjunctiva mild irritated with bilateral lower eyelid eversion.   Neck: Normal range of motion. Neck  supple. JVD present. No thyromegaly present.  Cardiovascular: Normal rate, regular rhythm and normal heart sounds.  Exam reveals decreased pulses.   No murmur heard. Pulses:      Dorsalis pedis pulses are 1+ on the left side.       Posterior tibial pulses are 1+ on the left side.  Pulmonary/Chest: Effort normal and breath sounds normal. No respiratory distress. She has no wheezes. She exhibits no tenderness.  Abdominal: Soft. Bowel sounds are normal. She exhibits  no distension. There is no tenderness.  Genitourinary:  Incontinent of bladder  Musculoskeletal: Normal range of motion. She exhibits edema. She exhibits no tenderness.  R AKA. Left lower leg edema appears new.   Lymphadenopathy:    She has no cervical adenopathy.  Neurological: She is alert and oriented to person, place, and time. She has normal reflexes. No cranial nerve deficit. She exhibits normal muscle tone. Coordination normal.  Skin: Rash noted. There is erythema.  Diffused erythematous scaly skin appearance. Psoriasis.  New hematoma at the left shin is not infected.   Psychiatric: She has a normal mood and affect. Her behavior is normal. Judgment and thought content normal. She exhibits abnormal recent memory.     Filed Vitals:   06/21/13 1619  BP: 136/68  Pulse: 68  Temp: 98.2 F (36.8 C)  TempSrc: Tympanic  Resp: 18      Labs reviewed: Basic Metabolic Panel:  Recent Labs  16/10/96 2318 07/23/12 0645  12/03/12 1407 12/03/12 1421  01/23/13 0720 02/06/13 05/04/13 05/08/13 1420 06/11/13 06/12/13 1520 06/22/13  NA 142 143  < > 137 136  < > 135 137 136* 140 142 142 140  K 4.2 3.6  < > 4.2 4.1  < > 4.2 4.2 4.0 3.4* 3.9 3.6 4.0  CL 105 108  < > 96 99  --  97  --   --   --   --   --   --   CO2  --  27  < > 29  --   --  28  --   --  28  --  29  --   GLUCOSE 126* 97  < > 94 95  --  83  --   --  155*  --  115  --   BUN 25* 20  < > 25* 26*  < > 15 16 12  10.9 11 9.7 12  CREATININE 1.10 0.87  < > 0.58 0.70  < > 0.56 0.5 0.6 0.8 0.6 0.7 0.6  CALCIUM  --  9.2  < > 9.2  --   --  9.0  --   --  9.3  --  9.4  --   MG  --  2.1  --   --   --   --   --   --   --   --   --   --   --   PHOS  --  3.5  --   --   --   --   --   --   --   --   --   --   --   TSH  --  1.787  < >  --   --   --   --  0.60 0.88  --  2.91  --   --   < > = values in this interval not displayed.  Liver Function Tests:  Recent Labs  01/23/13 0720  05/08/13 1420 06/11/13 06/12/13 1520  AST 14  < > 16 15  19   ALT 8  < > 12 10 12   ALKPHOS 53  < > 64 54 68  BILITOT 0.3  --  0.27  --  0.29  PROT 5.9*  --  6.9  --  7.1  ALBUMIN 3.5  --  3.2*  --  3.3*  < > = values in this interval not displayed.  CBC:  Recent Labs  12/03/12 1407  12/04/12 0820  05/08/13 1420 06/11/13 06/12/13 1520 06/22/13  WBC 26.9*  --  26.7*  < > 25.8* 28.5 31.1* 25.9  NEUTROABS 7.8*  --   --   --  6.6*  --  8.2*  --   HGB 12.8  < > 13.1  < > 12.8 12.1 13.0 12.0  HCT 37.3  < > 38.7  < > 38.9 36 39.3 35*  MCV 87.4  --  86.6  --  92.6  --  93.1  --   PLT 290  --  306  < > 307 252 253 266  < > = values in this interval not displayed.   Significant Diagnostic Results:  12/22/12 CXR no evidence of acute infiltrate or residual infiltrate   06/22/13 LLE venous US negative for DVT   Assessment/Plan Leg edema, left Worsened in the middle of the left lower leg, no pain or fever/coolness noted or cord like mass noted, weak pedal pulse. Will obtain BNP given chronic CHF, ABI of the left given AKA due to severe arterial disease, Venous US to r/o DVT, update CBC/BMP, VS qshift, weight daily in am to monitor.   Hypertension, uncontrolled on adm 03/20/12 Controlled on Lisinopril 10mg  along with Cardizem and lasix and Carvedilol with mild elevated Sbp in 140s.           Chronic diastolic HF (heart failure) Takes Furosemide 40mg  bid, Carvedilol 25mg  bid, clinically compensated. CMP monthly.         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 Cyndie Chime. Currently takes Targretin 75mg  II  daily ac supper. Monthly CBC, CMP, TSH, triglycerides.             Family/ staff Communication: skin care.    Goals of care: SNF and may  ambulate with her prosthesis.    Labs/tests ordered CBC, BMP, venous US LLE, ABI left, weight daily, BNP, VS q shift.

## 2013-06-21 NOTE — Telephone Encounter (Signed)
Per Dr Allyson Sabal orders, patient may hold PRADAXA the day prior and day of  teeth extraction and restart the next day .  Sent note to  Dr Egbert Garibaldi ,DDS and [place] at [facility]               [phone]                    [phone]   Notified daughter Sheran [facility]

## 2013-06-22 ENCOUNTER — Encounter: Payer: Self-pay | Admitting: *Deleted

## 2013-06-22 ENCOUNTER — Encounter: Payer: Self-pay | Admitting: Nurse Practitioner

## 2013-06-22 DIAGNOSIS — R600 Localized edema: Secondary | ICD-10-CM | POA: Insufficient documentation

## 2013-06-22 LAB — BASIC METABOLIC PANEL
BUN: 12 mg/dL (ref 4–21)
Creatinine: 0.6 mg/dL (ref 0.5–1.1)
Glucose: 107 mg/dL
Potassium: 4 mmol/L (ref 3.4–5.3)
Sodium: 140 mmol/L (ref 137–147)

## 2013-06-22 LAB — CBC AND DIFFERENTIAL: HCT: 35 % — AB (ref 36–46)

## 2013-06-22 NOTE — Assessment & Plan Note (Signed)
Takes Fe 325mg  bid and Folic acid 1mg  Hgb 12.3 02/06/13 and serum iron 54 02/20/13

## 2013-06-22 NOTE — Assessment & Plan Note (Signed)
Takes Furosemide 40mg  bid, Carvedilol 25mg  bid, clinically compensated. CMP monthly.

## 2013-06-22 NOTE — Assessment & Plan Note (Signed)
F/u Dr. Cyndie Chime. Currently takes Targretin 75mg  II  daily ac supper. Monthly CBC, CMP, TSH, triglycerides.

## 2013-06-22 NOTE — Assessment & Plan Note (Signed)
Presented heart rate controlled on Cardizem 120mg  and Carvedilol 25mg  bid, takes Pradaxa 75mg  bid

## 2013-06-22 NOTE — Assessment & Plan Note (Signed)
Controlled on Lisinopril 10mg along with Cardizem and lasix and Carvedilol with mild elevated Sbp in 140s.        

## 2013-06-22 NOTE — Assessment & Plan Note (Addendum)
Worsened in the middle of the left lower leg, no pain or fever/coolness noted or cord like mass noted, weak pedal pulse. Will obtain BNP given chronic CHF, ABI of the left given AKA due to severe arterial disease, Venous US to r/o DVT, update CBC/BMP, VS qshift, weight daily in am to monitor.

## 2013-06-25 ENCOUNTER — Non-Acute Institutional Stay (SKILLED_NURSING_FACILITY): Payer: Medicare Other | Admitting: Nurse Practitioner

## 2013-06-25 ENCOUNTER — Encounter: Payer: Self-pay | Admitting: Nurse Practitioner

## 2013-06-25 DIAGNOSIS — R609 Edema, unspecified: Secondary | ICD-10-CM

## 2013-06-25 DIAGNOSIS — I10 Essential (primary) hypertension: Secondary | ICD-10-CM

## 2013-06-25 DIAGNOSIS — I739 Peripheral vascular disease, unspecified: Secondary | ICD-10-CM

## 2013-06-25 DIAGNOSIS — I5032 Chronic diastolic (congestive) heart failure: Secondary | ICD-10-CM

## 2013-06-25 DIAGNOSIS — I4891 Unspecified atrial fibrillation: Secondary | ICD-10-CM

## 2013-06-25 DIAGNOSIS — R600 Localized edema: Secondary | ICD-10-CM

## 2013-06-25 NOTE — Assessment & Plan Note (Addendum)
The left leg--ABI showed PTA 0.75, DPA 0.64 06/23/13. Cardiology referral. No open wounds, denied pain, still warm to touch.

## 2013-06-25 NOTE — Progress Notes (Signed)
Patient ID: Sara [name], female   DOB: 03/18/1919, 79 y.o.   MRN: [id number]  Code Status: DNR  Chief Complaint:  Chief Complaint  Patient presents with  . Medical Managment of Chronic Issues    left lower leg edema.      HPI:  Problem List Items Addressed This Visit   Atrial fibrillation with RVR     Presented heart rate controlled on Cardizem 120mg  and Carvedilol 25mg  bid, takes Pradaxa 75mg  bid            Chronic diastolic HF (heart failure) (Chronic)     Takes Furosemide 40mg  bid, Carvedilol 25mg  bid, clinically compensated. BNP in 300s 06/21/13            Hypertension, uncontrolled on adm 03/20/12     Controlled on Lisinopril 10mg  along with Cardizem and lasix and Carvedilol with mild elevated Sbp in 140s.             Leg edema, left     Worsened in the middle of the left lower leg, no pain or fever/coolness noted or cord like mass noted, weak pedal pulse. Elevated BNP 380.1 06/21/13 ABI of the left: PTA 0.75, DPA 0.64 06/23/13,  Venous US ruled out DVT 06/22/13. CBC and CMP unremarkable except chronic leukocytosis. Cardiology referral.       PAD (peripheral artery disease) - Primary     The left leg--ABI showed PTA 0.75, DPA 0.64 06/23/13. Cardiology referral. No open wounds, denied pain, still warm to touch.        Review of Systems:  Review of Systems  Constitutional: Negative for fever, chills, weight loss, malaise/fatigue and diaphoresis.  HENT: Positive for hearing loss. Negative for congestion, ear discharge, ear pain and sore throat.   Eyes: Negative for pain, discharge and redness.  Respiratory: Positive for cough. Negative for sputum production, shortness of breath and wheezing.   Cardiovascular: Positive for leg swelling. Negative for chest pain, palpitations, orthopnea, claudication and PND.  Gastrointestinal: Negative for heartburn, nausea, vomiting, abdominal pain, diarrhea, constipation and blood in stool.  Genitourinary: Positive for  frequency (incontinent of bladder). Negative for dysuria, urgency, hematuria and flank pain.  Musculoskeletal: Positive for joint pain. Negative for back pain, falls, myalgias and neck pain.  Skin: Positive for itching and rash.       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 New skin tear LLE  Neurological: Positive for weakness (generalized). Negative for dizziness, tingling, tremors, sensory change, speech change, focal weakness, seizures, loss of consciousness and headaches.  Endo/Heme/Allergies: Negative for environmental allergies and polydipsia. Bruises/bleeds easily.  Psychiatric/Behavioral: Positive for memory loss. Negative for depression and hallucinations. The patient is not nervous/anxious and does not have insomnia.      Medications: Reviewed at [facility]    Physical Exam: Physical Exam  Constitutional: She is oriented to person, place, and time. She appears well-developed and well-nourished. No distress.  HENT:  Head: Normocephalic.  Right Ear: External ear normal.  Left Ear: External ear normal.  Nose: Nose normal.  Mouth/Throat: Oropharynx is clear and moist. No oropharyngeal exudate.  Eyes: EOM are normal. Pupils are equal, round, and reactive to light. Right conjunctiva is injected.  Only a few eyelashes remained and right conjunctiva mild irritated with bilateral lower eyelid eversion.   Neck: Normal range of motion. Neck supple. JVD present. No thyromegaly present.  Cardiovascular: Normal rate, regular rhythm and normal heart sounds.  Exam reveals decreased pulses.   No murmur heard. Pulses:  Dorsalis pedis pulses are 1+ on the left side.       Posterior tibial pulses are 1+ on the left side.  Pulmonary/Chest: Effort normal and breath sounds normal. No respiratory distress. She has no wheezes. She exhibits no tenderness.  Abdominal: Soft. Bowel sounds are normal. She exhibits no distension. There is no tenderness.  Genitourinary:  Incontinent of bladder   Musculoskeletal: Normal range of motion. She exhibits edema. She exhibits no tenderness.  R AKA. Left lower leg edema appears new.   Lymphadenopathy:    She has no cervical adenopathy.  Neurological: She is alert and oriented to person, place, and time. She has normal reflexes. No cranial nerve deficit. She exhibits normal muscle tone. Coordination normal.  Skin: Rash noted. There is erythema.  Diffused erythematous scaly skin appearance. Psoriasis.  New hematoma at the left shin is not infected.   Psychiatric: She has a normal mood and affect. Her behavior is normal. Judgment and thought content normal. She exhibits abnormal recent memory.     Filed Vitals:   06/25/13 1635  BP: 138/74  Pulse: 70  Temp: 97 F (36.1 C)  TempSrc: Tympanic  Resp: 20      Labs reviewed: Basic Metabolic Panel:  Recent Labs  96/29/52 2318 07/23/12 0645  12/03/12 1407 12/03/12 1421  01/23/13 0720 02/06/13 05/04/13 05/08/13 1420 06/11/13 06/12/13 1520 06/22/13  NA 142 143  < > 137 136  < > 135 137 136* 140 142 142 140  K 4.2 3.6  < > 4.2 4.1  < > 4.2 4.2 4.0 3.4* 3.9 3.6 4.0  CL 105 108  < > 96 99  --  97  --   --   --   --   --   --   CO2  --  27  < > 29  --   --  28  --   --  28  --  29  --   GLUCOSE 126* 97  < > 94 95  --  83  --   --  155*  --  115  --   BUN 25* 20  < > 25* 26*  < > 15 16 12  10.9 11 9.7 12  CREATININE 1.10 0.87  < > 0.58 0.70  < > 0.56 0.5 0.6 0.8 0.6 0.7 0.6  CALCIUM  --  9.2  < > 9.2  --   --  9.0  --   --  9.3  --  9.4  --   MG  --  2.1  --   --   --   --   --   --   --   --   --   --   --   PHOS  --  3.5  --   --   --   --   --   --   --   --   --   --   --   TSH  --  1.787  < >  --   --   --   --  0.60 0.88  --  2.91  --   --   < > = values in this interval not displayed.  Liver Function Tests:  Recent Labs  01/23/13 0720  05/08/13 1420 06/11/13 06/12/13 1520  AST 14  < > 16 15 19   ALT 8  < > 12 10 12   ALKPHOS 53  < > 64 54 68  BILITOT 0.3  --  0.27  --   0.29  PROT 5.9*  --  6.9  --  7.1  ALBUMIN 3.5  --  3.2*  --  3.3*  < > = values in this interval not displayed.  CBC:  Recent Labs  12/03/12 1407  12/04/12 0820  05/08/13 1420 06/11/13 06/12/13 1520 06/22/13  WBC 26.9*  --  26.7*  < > 25.8* 28.5 31.1* 25.9  NEUTROABS 7.8*  --   --   --  6.6*  --  8.2*  --   HGB 12.8  < > 13.1  < > 12.8 12.1 13.0 12.0  HCT 37.3  < > 38.7  < > 38.9 36 39.3 35*  MCV 87.4  --  86.6  --  92.6  --  93.1  --   PLT 290  --  306  < > 307 252 253 266  < > = values in this interval not displayed.   Significant Diagnostic Results:  12/22/12 CXR no evidence of acute infiltrate or residual infiltrate   06/22/13 LLE venous US negative for DVT  06/23/13 L ABI DTA 0.75, DPA 0.64   Assessment/Plan Leg edema, left Worsened in the middle of the left lower leg, no pain or fever/coolness noted or cord like mass noted, weak pedal pulse. Elevated BNP 380.1 06/21/13 ABI of the left: PTA 0.75, DPA 0.64 06/23/13,  Venous US ruled out DVT 06/22/13. CBC and CMP unremarkable except chronic leukocytosis. Cardiology referral.     Hypertension, uncontrolled on adm 03/20/12 Controlled on Lisinopril 10mg  along with Cardizem and lasix and Carvedilol with mild elevated Sbp in 140s.           Chronic diastolic HF (heart failure) Takes Furosemide 40mg  bid, Carvedilol 25mg  bid, clinically compensated. BNP in 300s 06/21/13          Atrial fibrillation with RVR Presented heart rate controlled on Cardizem 120mg  and Carvedilol 25mg  bid, takes Pradaxa 75mg  bid          PAD (peripheral artery disease) The left leg--ABI showed PTA 0.75, DPA 0.64 06/23/13. Cardiology referral. No open wounds, denied pain, still warm to touch.       Family/ staff Communication: skin care. Cardiology f/u.   Goals of care: SNF and may ambulate with her prosthesis.    Labs/tests ordered none

## 2013-06-25 NOTE — Assessment & Plan Note (Signed)
Controlled on Lisinopril 10mg along with Cardizem and lasix and Carvedilol with mild elevated Sbp in 140s.        

## 2013-06-25 NOTE — Assessment & Plan Note (Signed)
Presented heart rate controlled on Cardizem 120mg and Carvedilol 25mg bid, takes Pradaxa 75mg bid       

## 2013-06-25 NOTE — Assessment & Plan Note (Signed)
Takes Furosemide 40mg  bid, Carvedilol 25mg  bid, clinically compensated. BNP in 300s 06/21/13

## 2013-06-25 NOTE — Assessment & Plan Note (Signed)
Worsened in the middle of the left lower leg, no pain or fever/coolness noted or cord like mass noted, weak pedal pulse. Elevated BNP 380.1 06/21/13 ABI of the left: PTA 0.75, DPA 0.64 06/23/13,  Venous US ruled out DVT 06/22/13. CBC and CMP unremarkable except chronic leukocytosis. Cardiology referral.

## 2013-06-27 ENCOUNTER — Ambulatory Visit (INDEPENDENT_AMBULATORY_CARE_PROVIDER_SITE_OTHER): Payer: Medicare Other | Admitting: Cardiology

## 2013-06-27 ENCOUNTER — Encounter: Payer: Self-pay | Admitting: Cardiology

## 2013-06-27 ENCOUNTER — Ambulatory Visit: Payer: Medicare Other | Admitting: Cardiology

## 2013-06-27 VITALS — BP 150/70 | HR 80 | Ht 66.0 in | Wt 133.0 lb

## 2013-06-27 DIAGNOSIS — I2510 Atherosclerotic heart disease of native coronary artery without angina pectoris: Secondary | ICD-10-CM

## 2013-06-27 DIAGNOSIS — Z89611 Acquired absence of right leg above knee: Secondary | ICD-10-CM

## 2013-06-27 DIAGNOSIS — I739 Peripheral vascular disease, unspecified: Secondary | ICD-10-CM

## 2013-06-27 DIAGNOSIS — Z89619 Acquired absence of unspecified leg above knee: Secondary | ICD-10-CM | POA: Insufficient documentation

## 2013-06-27 DIAGNOSIS — I480 Paroxysmal atrial fibrillation: Secondary | ICD-10-CM

## 2013-06-27 DIAGNOSIS — I10 Essential (primary) hypertension: Secondary | ICD-10-CM

## 2013-06-27 DIAGNOSIS — R0602 Shortness of breath: Secondary | ICD-10-CM

## 2013-06-27 DIAGNOSIS — I5033 Acute on chronic diastolic (congestive) heart failure: Secondary | ICD-10-CM

## 2013-06-27 DIAGNOSIS — R079 Chest pain, unspecified: Secondary | ICD-10-CM

## 2013-06-27 DIAGNOSIS — I4891 Unspecified atrial fibrillation: Secondary | ICD-10-CM

## 2013-06-27 DIAGNOSIS — R609 Edema, unspecified: Secondary | ICD-10-CM

## 2013-06-27 DIAGNOSIS — S78119A Complete traumatic amputation at level between unspecified hip and knee, initial encounter: Secondary | ICD-10-CM

## 2013-06-27 DIAGNOSIS — C8404 Mycosis fungoides, lymph nodes of axilla and upper limb: Secondary | ICD-10-CM

## 2013-06-27 DIAGNOSIS — R600 Localized edema: Secondary | ICD-10-CM

## 2013-06-27 DIAGNOSIS — I509 Heart failure, unspecified: Secondary | ICD-10-CM

## 2013-06-27 HISTORY — DX: Acquired absence of unspecified leg above knee: Z89.619

## 2013-06-27 MED ORDER — ISOSORBIDE MONONITRATE ER 30 MG PO TB24
30.0000 mg | ORAL_TABLET | Freq: Every day | ORAL | Status: AC
Start: 2013-06-27 — End: ?

## 2013-06-27 NOTE — Assessment & Plan Note (Signed)
Chest pressure last pm and SOB.  EKG with nonspecific minimal changes.  Will add Imdur to medical regimen and treat CHF.  Currently in SR she may have had PAF last pm, though if so she was not aware. She has been turned down for CABG.  She will follow up with Dr. Allyson Sabal.

## 2013-06-27 NOTE — Assessment & Plan Note (Signed)
Pt with CHF today, with episode of chest pressure last pm.  I have increased her lasix to 80 mg this pm and 80 mg in AM though as pt was leaving, her daughter mentioned she was to have teeth pulled tomorrow and they held her Pradaxa.  I said it was fine to wait til Friday for second dose of 80 mg of Lasix.  Then she will resume 40 mg BID of Lasix.

## 2013-06-27 NOTE — Assessment & Plan Note (Signed)
Recent ABIs not our dopplers with decrease in ABI on Lt from .80 to .64,  Though difficult to compare different studies.  She is wheelchair bound and does not complain of claudication.  She does have small superficial wound on Lt shin.  Occurred with stand up device at home.  We cleaned with saline and applied ABX ointment.  It should be cleaned and dressed twice a day.

## 2013-06-27 NOTE — Assessment & Plan Note (Signed)
At end of visit with chest pressure, pt admitted to SOB with lying flat.

## 2013-06-27 NOTE — Assessment & Plan Note (Signed)
mildly elevated BP, we adding Imdur.

## 2013-06-27 NOTE — Assessment & Plan Note (Signed)
Has been turned down for CABG.  Will treat medically.

## 2013-06-27 NOTE — Assessment & Plan Note (Signed)
In Wheel chair since amputation.

## 2013-06-27 NOTE — Assessment & Plan Note (Addendum)
Some edema of lt leg.  - DVT on doppler, + 3.5 CM enlarged lymph node.  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 Adenopathy. I called Dr. Patsy Lager office, He was unavailable-but I left a message concerning any further studies.  I have asked them to order or to contact us to order.  Pt will call their office tomorrow if she has not heard from them.  I would wonder if the node is compressing venous system causing increased edema as she is in wheel chair most of time.  I have increased lasix for 2 days due to CHF.

## 2013-06-27 NOTE — Patient Instructions (Signed)
I am adding Isosorbide 30 mg daily to prevent chest pressure.    Take an extra 40 mg lasix today for a total of 80 mg this afternoon, then in AM take 80 mg and then resume 40 mg twice a day beginning with evening dose tomorrow.  Change dressing on Lt leg twice a day and apply antibiotic cream  May resume Omega/coQ10 and Vitamin C 500 mg daily.  Follow up with Dr. Allyson Sabal in 1 month.

## 2013-06-27 NOTE — Progress Notes (Signed)
06/27/2013   PCP: Londell Moh, MD   Chief Complaint  Patient presents with  . Follow-up    enlarged lymph node in lt. lower leg     Primary Cardiologist: Dr. Allyson Sabal  HPI:  79 year-old white female with complex medical history.  She is here today for lt lower ext edema, abnormal ABI on Lt, and enlarged lymph node in Lt groin.   Prior to the end of the visit she did state she was short of breath lying flat and then she "confessed" to chest pressure last pm.  None today.     She was hospitalized in January 2014 and had an above-the-knee amputation then she was readmitted for syncope felt to be symptomatic hypotension. The MRI of the brain was negative for acute infarct. She had an echo and carotid Dopplers that were stable and no other issues. She was given IV fluids. At that time, she was on aspirin and Plavix. Her carvedilol was down to 12.5 mg twice a day and her lisinopril was at 5 mg a day, previously had been at 40. She was readmitted in February due to dyspnea on exertion and nonhealing wound. She improved from that hospitalization and I do not see that we were consulted on any of these hospitalizations. On December 03, 2012, she was readmitted with acute stroke. She was having speech difficulties. She knew what she wanted to say but she could not get the words out. She was found to be in A-fib with RVR and medications were adjusted including increasing her carvedilol back to 25 mg twice a day. She was discharged on December 05, 2012, and is in the skilled nursing facility at [facility] for rehab from her AKA as well as her CVA, though, she has not much residual from the CVA. At the time of discharge, on December 03, 2012, she was in sinus rhythm. During that hospitalization she had A-fib with RVR but on arrival to [facility] she was back in sinus rhythm as well. Since that time, she stabilized. Previously, we only had her on aspirin and Plavix, but due to the new  right-sided CVA, most likely embolic from her A-fib, we added Pradaxa and stopped her aspirin completely. Additionally, in the past she has been diagnosed with psoriasis but she has been followed with Dr. 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 Other history does include coronary artery disease, significant 3-vessel disease and is not felt to be a good bypass candidate. She was seen by Dr. Laneta Simmers in the past and medical therapy only has been recommended. She has also had a high-grade left internal carotid artery stenosis that Dr. Allyson Sabal stented July 2012 under the [employer]  protocol. Dopplers during this year with hospitalization reveal that the stent was widely patent.   Pt recently underwent venous doppler on Lt leg negative for DVT but positive for 3.5 cm enlarged lymph node, concerning due to adenopathy only upper ext previously. This could be compressing venous system in Lt leg adding to edema.  Arterial doppler with decreased ABI on Lt from 12/2011, though not our dopplers.  Pt denied claudication.  She is in a wheel chair.  If wound healing becomes an issue or claudication we would do LEA dopplers in our office.    Allergies  Allergen Reactions  . Evista [Raloxifene]   . Penicillins Other (See Comments)  unknown  . Triamterene Other (See Comments)    unknown  . Alendronate Sodium Rash    Pt stated oh k to use biotene    Current Outpatient Prescriptions  Medication Sig Dispense Refill  . atorvastatin (LIPITOR) 40 MG tablet Take 1 tablet (40 mg total) by mouth daily at 6 PM.      . bexarotene (TARGRETIN) 75 MG CAPS capsule Take 150 mg by mouth daily before supper. Give with food. Protect from light. CAUTION: Chemotherapy/Biotherapy-Wear gloves when handling      . carboxymethylcellulose (REFRESH PLUS) 0.5 % SOLN 1 drop 4 (four) times daily. Both eyes      . carvedilol (COREG) 25 MG tablet Take 25 mg by mouth 2 (two)  times daily with a meal.       . dabigatran (PRADAXA) 150 MG CAPS Take 75 mg by mouth every 12 (twelve) hours.       Marland Kitchen diltiazem (CARDIZEM CD) 120 MG 24 hr capsule Take 120 mg by mouth daily. Take 1 tablet once daily for BP.      . famotidine (PEPCID) 20 MG tablet Take 40 mg by mouth daily.      . folic acid (FOLVITE) 1 MG tablet Take 1 mg by mouth daily.      . furosemide (LASIX) 40 MG tablet Take 1 tablet (40 mg total) by mouth 2 (two) times daily.  30 tablet  0  . gabapentin (NEURONTIN) 100 MG capsule Take 1 capsule (100 mg total) by mouth at bedtime.      Marland Kitchen levocetirizine (XYZAL) 5 MG tablet Take 5 mg by mouth at bedtime as needed (For itching.).       Marland Kitchen lisinopril (PRINIVIL,ZESTRIL) 5 MG tablet Take 10 mg by mouth daily. Take 1 tablet daily for blood pressure and protect kidneys.      . nitroGLYCERIN (NITROSTAT) 0.4 MG SL tablet Place 0.4 mg under the tongue every 5 (five) minutes as needed for chest pain. If no relief call MD.      . polyethylene glycol (MIRALAX / GLYCOLAX) packet Take 17 g by mouth daily. Take 1 packet mixed in 4 oz of beverage every day to relieve constipation.      . Polyvinyl Alcohol-Povidone (REFRESH OP) Apply 1 drop to eye 4 (four) times daily. Instill 1 drop to both eyes four times daily.*Wait 3-5 minutes between eye drops*      . ferrous sulfate 325 (65 FE) MG tablet Take 325 mg by mouth 2 (two) times daily. Take one tablet twice daily for iron supplement.      . isosorbide mononitrate (IMDUR) 30 MG 24 hr tablet Take 1 tablet (30 mg total) by mouth daily.  30 tablet  6  . Omega-3 Fatty Acids (FISH OIL) 1000 MG CAPS Take 1,000 mg by mouth at bedtime.       . vitamin C (ASCORBIC ACID) 500 MG tablet Take 500 mg by mouth daily.       No current facility-administered medications for this visit.    Past Medical History  Diagnosis Date  . Hypertension   . Atrial fibrillation   . Psoriasis   . Congestive heart failure (CHF)   . History of CVA (cerebrovascular  accident)   . Coronary artery disease   . Myocardial infarction   . CHF (congestive heart failure)   . Shortness of breath   . H/O hiatal hernia   . Closed fracture of head of left humerus 5/12  . Basal cell carcinoma   .  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  Flow cytometry 04/04/12: 91% cells CD4 positive CD 26 negative  . Hyperlipemia   . Pneumonia   . Wound of right leg 09/25/2012    Necrotic, open wound right lower leg; non healing  . Stroke   . Hyposmolality and/or hyponatremia   . Lower limb amputation, above knee 10/11/2012  . Pain in joint, multiple sites   . Mycosis fungoides, unspecified site, extranodal and solid organ sites 09/28/2012  . Occlusion and stenosis of carotid artery without mention of cerebral infarction 09/28/2012  . Chronic venous hypertension with ulcer and inflammation 09/28/2012  . Diverticulosis of colon (without mention of hemorrhage) 09/28/2012  . Synovial cyst of popliteal space 03/26/2012  . Unspecified constipation 03/24/2012  . Hypopotassemia 01/26/2012  . Debility, unspecified 01/17/2012  . Anxiety state, unspecified 12/31/2011  . Anemia, unspecified 12/21/2011  . Other specified disease of white blood cells 12/21/2011  . Peripheral vascular disease, unspecified 12/21/2011    recent ABI Lt of 0.64 down from ).80 on 12/29/11- though different labs  . Reflux esophagitis 12/21/2011  . Unspecified hereditary and idiopathic peripheral neuropathy 12/14/2011  . Pain in joint, ankle and foot 12/14/2011  . Carotid stenosis     CAROTID DOPPLER, 05/02/2012 - LEFT VERTEBRAL-occluded, LEFT BULB AND PROXIMAL ICA STENT-moderate amount of irregular mixed dense plaque 50-69% diameter reduction  . Pre-syncope     NUCLEAR STRESS TEST, 12/10/2009 - normal, EKG negative for ischemia  . TIA (transient ischemic attack)     2D  ECHO, 12/05/2012 - EF 60-65%, Severely calcified annulus of the mitral valve with mild-moderate regurgitation, LA moderate-severely dilated  . Paroxysmal atrial fibrillation   . CAD in native artery     3 vessel CAD, medical therapy, not a candidate for CABG  . S/P AKA (above knee amputation) unilateral, Rt leg due to PAD and non healing wound 06/27/2013    Past Surgical History  Procedure Laterality Date  . Tonsillectomy    . Dilation and curettage of uterus    . Eye surgery    . Cataracts    . Vertebroplasty    . Amputation  10/10/2012    Procedure: AMPUTATION ABOVE KNEE;  Surgeon: Nadara Mustard, MD;  Location: [facility] OR;  Service: Orthopedics;  Laterality: Right;  . Percutaneous stent intervention  04/13/2011    Left internal carotid artery stented with a 8x30 prcise nitinol self-expanding stent resulting reduction of 90% to less than 20% residual  . Cardiac catheterization  11/06/2010    CABG recommended, turned down by [facility] secondary to co morbidities    ZOX:WRUEAVW:UJ colds or fevers, no weight changes Skin:sm wound on Lt shin from trauma with a bath, some drainage, skin with diffuse cutaneous rash, and desquamation of skin-palms and arms HEENT:no blurred vision, no congestion CV:see HPI PUL:see HPI GI:no diarrhea constipation or melena, no indigestion GU:no hematuria, no dysuria MS:no joint pain, no claudication Neuro:no syncope, no lightheadedness Endo:no diabetes, no thyroid disease  PHYSICAL EXAM BP 150/70  Pulse 80  Ht 5\' 6"  (1.676 m)  Wt 133 lb (60.328 kg)  BMI 21.48 kg/m2 General:Pleasant affect, NAD Skin:Warm and dry, brisk capillary refill, diffuse rash all over body, with peeling of skin in palms and some on arms HEENT:normocephalic, sclera clear, mucus membranes moist Neck:supple, no JVD, no bruits  Heart:S1S2 RRR without murmur, gallup, rub or  click Lungs: with rales Rt > Lt 1/3 way up, no rhonchi, or wheezes AVW:UJWJ, non tender, + BS, do not palpate liver  spleen or masses Ext: Rt AKA,  lower ext edema in Lt calf muscle, pockets of edema,  2+ radial pulses. Pt could not get onto table, with her standing with several assistants I was unable to palpate lymph node.  Neuro:alert and oriented, MAE, follows commands, + facial symmetry  EKG:SR with non specific ST changes minimal from last EKG  ASSESSMENT AND PLAN Chest pain Chest pressure last pm and SOB.  EKG with nonspecific minimal changes.  Will add Imdur to medical regimen and treat CHF.  Currently in SR she may have had PAF last pm, though if so she was not aware. She has been turned down for CABG.  She will follow up with Dr. Allyson Sabal.    Coronary artery disease Has been turned down for CABG.  Will treat medically.  PAF (paroxysmal atrial fibrillation) Maintaining SR  Leg edema, left Some edema of lt leg.  - DVT on doppler, + 3.5 CM enlarged lymph node.  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 Adenopathy. I called Dr. Patsy Lager office, He was unavailable-but I left a message concerning any further studies.  I have asked them to order or to contact us to order.  Pt will call their office tomorrow if she has not heard from them.  I would wonder if the node is compressing venous system causing increased edema as she is in wheel chair most of time.  I have increased lasix for 2 days due to CHF.  Acute on chronic diastolic CHF (congestive heart failure) Pt with CHF today, with episode of chest pressure last pm.  I have increased her lasix to 80 mg this pm and 80 mg in AM though as pt was leaving, her daughter mentioned she was to have teeth pulled tomorrow and they held her Pradaxa.  I said it was fine to wait til Friday for second dose of 80 mg of Lasix.  Then she will resume 40 mg BID of Lasix.  PAD (peripheral artery disease) Recent ABIs not our dopplers with decrease in ABI on Lt from .80 to .64,  Though difficult to compare different studies.  She  is wheelchair bound and does not complain of claudication.  She does have small superficial wound on Lt shin.  Occurred with stand up device at home.  We cleaned with saline and applied ABX ointment.  It should be cleaned and dressed twice a day.   HTN (hypertension) mildly elevated BP, we adding Imdur.  S/P AKA (above knee amputation) unilateral, Rt leg due to PAD and non healing wound In Wheel chair since amputation.  SOB (shortness of breath) At end of visit with chest pressure, pt admitted to SOB with lying flat.  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    Please note her daughter was here today and is quite concerned that her mother's vitamins have been stopped.  They were stopped to reduce the number of pills the pt was taking, she tells me she has too many to take.  She does agree to take some of the vitamins to satisfy her daughter.  I think adding back the omegaQ with COQ10 and fishoil is somewhat beneficial,  And the Vit c only  at 500 mg would be ok.  But no others at this time.  Though if pt wishes to stop these at any time I would support her decision.  Taking the 2 vit. Seemed to be a compromise to promote harmony within the family.

## 2013-06-27 NOTE — Assessment & Plan Note (Signed)
Diffuse cutaneous rash; desquamation skin palms & soles, we discussed large patches of peeled skin to trim to prevent tearing.

## 2013-06-27 NOTE — Assessment & Plan Note (Signed)
Maintaining SR.    

## 2013-06-28 ENCOUNTER — Telehealth: Payer: Self-pay | Admitting: Cardiology

## 2013-06-28 ENCOUNTER — Encounter: Payer: Self-pay | Admitting: Cardiology

## 2013-06-28 NOTE — Telephone Encounter (Signed)
Patient saw Nada Boozer, NP yesterday and there is some confusion about an order.

## 2013-06-28 NOTE — Telephone Encounter (Signed)
Called back and talked to nurse  About pts. Imdur. Issue resolved

## 2013-06-28 NOTE — Telephone Encounter (Signed)
Message forwarded to J.C. Wildman, LPN.  

## 2013-07-02 ENCOUNTER — Telehealth: Payer: Self-pay | Admitting: Oncology

## 2013-07-02 NOTE — Telephone Encounter (Signed)
Called [facility] left message regarding pt's appt on 09/23/12 lab and ML

## 2013-07-05 LAB — CBC AND DIFFERENTIAL
HCT: 35 % — AB (ref 36–46)
Hemoglobin: 11.6 g/dL — AB (ref 12.0–16.0)
Platelets: 286 10*3/uL (ref 150–399)
WBC: 27.9 10^3/mL

## 2013-07-05 LAB — HEPATIC FUNCTION PANEL
ALT: 8 U/L (ref 7–35)
AST: 16 U/L (ref 13–35)
Bilirubin, Total: 0.4 mg/dL

## 2013-07-05 LAB — BASIC METABOLIC PANEL
Creatinine: 0.6 mg/dL (ref 0.5–1.1)
Glucose: 86 mg/dL
Potassium: 4 mmol/L (ref 3.4–5.3)
Sodium: 139 mmol/L (ref 137–147)

## 2013-07-18 ENCOUNTER — Encounter: Payer: Self-pay | Admitting: Nurse Practitioner

## 2013-07-18 ENCOUNTER — Non-Acute Institutional Stay (SKILLED_NURSING_FACILITY): Payer: Medicare Other | Admitting: Nurse Practitioner

## 2013-07-18 DIAGNOSIS — K5900 Constipation, unspecified: Secondary | ICD-10-CM

## 2013-07-18 DIAGNOSIS — I4891 Unspecified atrial fibrillation: Secondary | ICD-10-CM

## 2013-07-18 DIAGNOSIS — G546 Phantom limb syndrome with pain: Secondary | ICD-10-CM

## 2013-07-18 DIAGNOSIS — I480 Paroxysmal atrial fibrillation: Secondary | ICD-10-CM

## 2013-07-18 DIAGNOSIS — I5032 Chronic diastolic (congestive) heart failure: Secondary | ICD-10-CM

## 2013-07-18 DIAGNOSIS — G547 Phantom limb syndrome without pain: Secondary | ICD-10-CM

## 2013-07-18 DIAGNOSIS — I2510 Atherosclerotic heart disease of native coronary artery without angina pectoris: Secondary | ICD-10-CM

## 2013-07-18 DIAGNOSIS — B079 Viral wart, unspecified: Secondary | ICD-10-CM | POA: Insufficient documentation

## 2013-07-18 NOTE — Assessment & Plan Note (Signed)
Risk reduction with Atorvastatin 40mg and Fish oil. Takes Imdur 30mg daily.      

## 2013-07-18 NOTE — Assessment & Plan Note (Signed)
Managed with MiraLax qod   

## 2013-07-18 NOTE — Progress Notes (Signed)
Patient ID: Sara [name], female   DOB: September 02, 1919, 79 y.o.   MRN: [id number]  Code Status: DNR  Chief Complaint:  Chief Complaint  Patient presents with  . Medical Managment of Chronic Issues    skin lesion left wrist.   . Acute Visit     HPI: left wrist wart appearance skin lesion--I was requested to evaluate-the patient stated it have there for a long time and failed her banana peel remedy.   Problem List Items Addressed This Visit   Chronic diastolic HF (heart failure) (Chronic)     Takes Furosemide 40mg  bid, Carvedilol 25mg  bid, clinically compensated. BNP in 300s 06/21/13              Coronary artery disease     Risk reduction with Atorvastatin 40mg  and Fish oil. Takes Imdur 30mg  daily.       PAF (paroxysmal atrial fibrillation)     Presented heart rate controlled on Cardizem 120mg  and Carvedilol 25mg  bid, takes Pradaxa 75mg  bid              Phantom limb pain (Chronic)     Right AKA--managed with Gabapentin 100mg  nightly.           Unspecified constipation - Primary     Managed with MiraLax qod          Wart     A wart appearance skin lesion left wrist--Dermatology referral when the patient desires.        Review of Systems:  Review of Systems  Constitutional: Negative for fever, chills, weight loss, malaise/fatigue and diaphoresis.  HENT: Positive for hearing loss. Negative for congestion, ear discharge, ear pain and sore throat.   Eyes: Negative for pain, discharge and redness.  Respiratory: Negative for cough, sputum production, shortness of breath and wheezing.   Cardiovascular: Positive for leg swelling. Negative for chest pain, palpitations, orthopnea, claudication and PND.       Trace  Gastrointestinal: Negative for heartburn, nausea, vomiting, abdominal pain, diarrhea, constipation and blood in stool.  Genitourinary: Positive for frequency (incontinent of bladder). Negative for dysuria, urgency, hematuria and flank pain.   Musculoskeletal: Positive for joint pain. Negative for back pain, falls, myalgias and neck pain.  Skin: Positive for itching and rash.       Chronic psoriasis, very thin and red skin, frequent skin abrasion or tear. Left wrist wart appearance skin lesion.   Neurological: Negative for dizziness, tingling, tremors, sensory change, speech change, focal weakness, seizures, loss of consciousness, weakness (generalized) and headaches.  Endo/Heme/Allergies: Negative for environmental allergies and polydipsia. Bruises/bleeds easily.  Psychiatric/Behavioral: Positive for memory loss. Negative for depression and hallucinations. The patient is not nervous/anxious and does not have insomnia.      Medications: Reviewed at [facility]    Physical Exam: Physical Exam  Constitutional: She is oriented to person, place, and time. She appears well-developed and well-nourished. No distress.  HENT:  Head: Normocephalic.  Right Ear: External ear normal.  Left Ear: External ear normal.  Nose: Nose normal.  Mouth/Throat: Oropharynx is clear and moist. No oropharyngeal exudate.  Eyes: EOM are normal. Pupils are equal, round, and reactive to light. Right conjunctiva is injected.  Only a few eyelashes remained and right conjunctiva mild irritated with bilateral lower eyelid eversion.   Neck: Normal range of motion. Neck supple. JVD present. No thyromegaly present.  Cardiovascular: Normal rate, regular rhythm and normal heart sounds.  Exam reveals decreased pulses.   No murmur heard. Pulses:  Dorsalis pedis pulses are 1+ on the left side.       Posterior tibial pulses are 1+ on the left side.  Pulmonary/Chest: Effort normal and breath sounds normal. No respiratory distress. She has no wheezes. She exhibits no tenderness.  Abdominal: Soft. Bowel sounds are normal. She exhibits no distension. There is no tenderness.  Genitourinary:  Incontinent of bladder  Musculoskeletal: Normal range of motion. She exhibits  edema. She exhibits no tenderness.  R AKA. Left lower leg edema appears new.   Lymphadenopathy:    She has no cervical adenopathy.  Neurological: She is alert and oriented to person, place, and time. She has normal reflexes. No cranial nerve deficit. She exhibits normal muscle tone. Coordination normal.  Skin: Rash noted. There is erythema.  Diffused erythematous scaly skin appearance. Psoriasis. Left wrist wart appearance skin lesion--f/u Dermatology.   Psychiatric: She has a normal mood and affect. Her behavior is normal. Judgment and thought content normal. She exhibits abnormal recent memory.     Filed Vitals:   07/18/13 1714  BP: 130/82  Pulse: 70  Temp: 96.9 F (36.1 C)  TempSrc: Tympanic  Resp: 20      Labs reviewed: Basic Metabolic Panel:  Recent Labs  40/98/11 2318 07/23/12 0645  12/03/12 1407 12/03/12 1421  01/23/13 0720  05/04/13 05/08/13 1420 06/11/13 06/12/13 1520 06/22/13 07/05/13  NA 142 143  < > 137 136  < > 135  < > 136* 140 142 142 140 139  K 4.2 3.6  < > 4.2 4.1  < > 4.2  < > 4.0 3.4* 3.9 3.6 4.0 4.0  CL 105 108  < > 96 99  --  97  --   --   --   --   --   --   --   CO2  --  27  < > 29  --   --  28  --   --  28  --  29  --   --   GLUCOSE 126* 97  < > 94 95  --  83  --   --  155*  --  115  --   --   BUN 25* 20  < > 25* 26*  < > 15  < > 12 10.9 11 9.7 12 14   CREATININE 1.10 0.87  < > 0.58 0.70  < > 0.56  < > 0.6 0.8 0.6 0.7 0.6 0.6  CALCIUM  --  9.2  < > 9.2  --   --  9.0  --   --  9.3  --  9.4  --   --   MG  --  2.1  --   --   --   --   --   --   --   --   --   --   --   --   PHOS  --  3.5  --   --   --   --   --   --   --   --   --   --   --   --   TSH  --  1.787  < >  --   --   --   --   < > 0.88  --  2.91  --   --  1.15  < > = values in this interval not displayed.  Liver Function Tests:  Recent Labs  01/23/13 0720  05/08/13 1420 06/11/13 06/12/13 1520 07/05/13  AST 14  < > 16 15 19 16   ALT 8  < > 12 10 12 8   ALKPHOS 53  < > 64 54 68 48   BILITOT 0.3  --  0.27  --  0.29  --   PROT 5.9*  --  6.9  --  7.1  --   ALBUMIN 3.5  --  3.2*  --  3.3*  --   < > = values in this interval not displayed.  CBC:  Recent Labs  12/03/12 1407  12/04/12 0820  05/08/13 1420  06/12/13 1520 06/22/13 07/05/13  WBC 26.9*  --  26.7*  < > 25.8*  < > 31.1* 25.9 27.9  NEUTROABS 7.8*  --   --   --  6.6*  --  8.2*  --   --   HGB 12.8  < > 13.1  < > 12.8  < > 13.0 12.0 11.6*  HCT 37.3  < > 38.7  < > 38.9  < > 39.3 35* 35*  MCV 87.4  --  86.6  --  92.6  --  93.1  --   --   PLT 290  --  306  < > 307  < > 253 266 286  < > = values in this interval not displayed.   Significant Diagnostic Results:  12/22/12 CXR no evidence of acute infiltrate or residual infiltrate   06/22/13 LLE venous US negative for DVT  06/23/13 L ABI DTA 0.75, DPA 0.64   Assessment/Plan Unspecified constipation Managed with MiraLax qod        PAF (paroxysmal atrial fibrillation) Presented heart rate controlled on Cardizem 120mg  and Carvedilol 25mg  bid, takes Pradaxa 75mg  bid            Chronic diastolic HF (heart failure) Takes Furosemide 40mg  bid, Carvedilol 25mg  bid, clinically compensated. BNP in 300s 06/21/13            Coronary artery disease Risk reduction with Atorvastatin 40mg  and Fish oil. Takes Imdur 30mg  daily.     Phantom limb pain Right AKA--managed with Gabapentin 100mg  nightly.         Wart A wart appearance skin lesion left wrist--Dermatology referral when the patient desires.       Family/ staff Communication: skin care. Dermatology when desires.    Goals of care: SNF and may ambulate with her prosthesis.    Labs/tests ordered none

## 2013-07-18 NOTE — Assessment & Plan Note (Signed)
A wart appearance skin lesion left wrist--Dermatology referral when the patient desires.

## 2013-07-18 NOTE — Assessment & Plan Note (Signed)
Takes Furosemide 40mg bid, Carvedilol 25mg bid, clinically compensated. BNP in 300s 06/21/13         

## 2013-07-18 NOTE — Assessment & Plan Note (Signed)
Presented heart rate controlled on Cardizem 120mg and Carvedilol 25mg bid, takes Pradaxa 75mg bid       

## 2013-07-18 NOTE — Assessment & Plan Note (Signed)
Right AKA--managed with Gabapentin 100mg nightly 

## 2013-07-24 ENCOUNTER — Other Ambulatory Visit: Payer: Self-pay | Admitting: Nurse Practitioner

## 2013-07-24 ENCOUNTER — Telehealth: Payer: Self-pay | Admitting: Oncology

## 2013-07-24 ENCOUNTER — Ambulatory Visit (HOSPITAL_BASED_OUTPATIENT_CLINIC_OR_DEPARTMENT_OTHER): Payer: Medicare Other | Admitting: Nurse Practitioner

## 2013-07-24 ENCOUNTER — Encounter (INDEPENDENT_AMBULATORY_CARE_PROVIDER_SITE_OTHER): Payer: Self-pay

## 2013-07-24 ENCOUNTER — Other Ambulatory Visit (HOSPITAL_BASED_OUTPATIENT_CLINIC_OR_DEPARTMENT_OTHER): Payer: Medicare Other | Admitting: Lab

## 2013-07-24 VITALS — BP 158/65 | HR 63 | Temp 97.1°F | Resp 18

## 2013-07-24 DIAGNOSIS — R0609 Other forms of dyspnea: Secondary | ICD-10-CM

## 2013-07-24 DIAGNOSIS — C8404 Mycosis fungoides, lymph nodes of axilla and upper limb: Secondary | ICD-10-CM

## 2013-07-24 LAB — CBC WITH DIFFERENTIAL/PLATELET
BASO%: 0.8 % (ref 0.0–2.0)
EOS%: 0.5 % (ref 0.0–7.0)
Eosinophils Absolute: 0.2 10*3/uL (ref 0.0–0.5)
HCT: 37.2 % (ref 34.8–46.6)
MCH: 30.2 pg (ref 25.1–34.0)
MCHC: 31.9 g/dL (ref 31.5–36.0)
MCV: 94.9 fL (ref 79.5–101.0)
MONO#: 1 10*3/uL — ABNORMAL HIGH (ref 0.1–0.9)
MONO%: 3.7 % (ref 0.0–14.0)
NEUT#: 7.8 10*3/uL — ABNORMAL HIGH (ref 1.5–6.5)
NEUT%: 28.1 % — ABNORMAL LOW (ref 38.4–76.8)
RBC: 3.91 10*6/uL (ref 3.70–5.45)
RDW: 13.8 % (ref 11.2–14.5)

## 2013-07-24 LAB — TRIGLYCERIDES: Triglycerides: 248 mg/dL — ABNORMAL HIGH (ref <150)

## 2013-07-24 LAB — COMPREHENSIVE METABOLIC PANEL (CC13)
ALT: 9 U/L (ref 0–55)
AST: 16 U/L (ref 5–34)
Albumin: 3.5 g/dL (ref 3.5–5.0)
Alkaline Phosphatase: 64 U/L (ref 40–150)
BUN: 12.7 mg/dL (ref 7.0–26.0)
CO2: 27 mEq/L (ref 22–29)
Creatinine: 0.7 mg/dL (ref 0.6–1.1)
Potassium: 4 mEq/L (ref 3.5–5.1)
Sodium: 140 mEq/L (ref 136–145)
Total Bilirubin: 0.38 mg/dL (ref 0.20–1.20)
Total Protein: 6.8 g/dL (ref 6.4–8.3)

## 2013-07-24 LAB — TECHNOLOGIST REVIEW

## 2013-07-24 LAB — LIPASE: Lipase: 20 U/L (ref 0–75)

## 2013-07-24 NOTE — Progress Notes (Signed)
OFFICE PROGRESS NOTE  Interval history:   Sara [name] is a 79 year old woman diagnosed with cutaneous T-cell lymphoma, mycosis fungoides, July 2013. She began a trial of low-dose Targretin in January of this year. The dose was escalated to 150 mg daily following an office visit on 12/19/2012. At the time of an office visit on 03/20/2013 she was felt to be responding with a significant decrease in erythema and desquamation. At the time of her visit on 05/08/2013 she was noted to have multiple new papules on the trunk. She was referred to Dr. Dayton Scrape and radiation oncology. He didn't feel that the chest papules were significant enough to initiate a course of radiation.   She is seen today for scheduled followup. She feels she is tolerating the Targretin well. She denies nausea/vomiting. No mouth sores. No diarrhea. She feels overall her skin is better though she does note some cyclic changes especially over the hands. She has had mild dyspnea for several weeks as well as an intermittently productive cough. She denies fever. No chest pain. She thinks she also has sinus drainage.   Objective: Blood pressure 158/65, pulse 63, temperature 97.1 F (36.2 C), temperature source Oral, resp. rate 18, weight 0 lb (0 kg).  Oropharynx is without thrush or ulceration. No palpable cervical or supraclavicular lymph nodes. Bilateral axillary lymphadenopathy left greater than right. Inspiratory rales at both lung bases. Regular cardiac rhythm. Abdomen soft and nontender. Right BKA. No left leg edema. Less erythema over the back. Palms with minimal desquamation.  Lab Results: Lab Results  Component Value Date   WBC 27.8* 07/24/2013   HGB 11.8 07/24/2013   HCT 37.2 07/24/2013   MCV 94.9 07/24/2013   PLT 272 07/24/2013    Chemistry:    Chemistry      Component Value Date/Time   NA 140 07/24/2013 1415   NA 139 07/05/2013   NA 135 01/23/2013 0720   K 4.0 07/24/2013 1415   K 4.0 07/05/2013   CL 97 01/23/2013 0720    CL 102 09/25/2012 1002   CO2 27 07/24/2013 1415   CO2 28 01/23/2013 0720   BUN 12.7 07/24/2013 1415   BUN 14 07/05/2013   BUN 15 01/23/2013 0720   CREATININE 0.7 07/24/2013 1415   CREATININE 0.6 07/05/2013   CREATININE 0.56 01/23/2013 0720   CREATININE 0.70 12/03/2012 1421   GLU 86 07/05/2013      Component Value Date/Time   CALCIUM 9.6 07/24/2013 1415   CALCIUM 9.0 01/23/2013 0720   ALKPHOS 64 07/24/2013 1415   ALKPHOS 48 07/05/2013   AST 16 07/24/2013 1415   AST 16 07/05/2013   ALT 9 07/24/2013 1415   ALT 8 07/05/2013   BILITOT 0.38 07/24/2013 1415   BILITOT 0.3 01/23/2013 0720       Studies/Results: No results found.  Medications: I have reviewed the patient's current medications.  Assessment/Plan:  1. Mycosis fungoides/cutaneous T-cell lymphoma. She began Targretin, low-dose, January 2014. The dose was escalated to 150 mg daily following an office visit 12/19/2012.  2. Long-standing psoriasis.  3. Nonhealing wound right lateral malleolus with development of osteomyelitis requiring recent right AKA. 4. Chronic atrial fibrillation with recent embolic stroke. 5. Hypertension. 6. Coronary artery disease with previous MI; chronic congestive heart failure. 7. Recent dyspnea and cough.  Disposition-she appears to be responding to the Targretin. Plan to continue current dose of 150 mg daily. Labs are being monitored on a monthly basis.  She has recent dyspnea and a cough. We recommended  a chest x-ray today. She was concerned that she would not have transportation back to the nursing facility if she stayed to have a chest x-ray completed. We sent an order back with her to have a chest x-ray done at the facility.  She will return for a followup visit in approximately 6 weeks.  Plan reviewed with Dr. Cyndie Chime.    Lonna Cobb ANP/GNP-BC

## 2013-07-24 NOTE — Telephone Encounter (Signed)
gv and printed appt sched and avs for pt for DEC °

## 2013-08-01 ENCOUNTER — Encounter: Payer: Self-pay | Admitting: Oncology

## 2013-08-02 ENCOUNTER — Ambulatory Visit (INDEPENDENT_AMBULATORY_CARE_PROVIDER_SITE_OTHER): Payer: Medicare Other | Admitting: Cardiovascular Disease

## 2013-08-02 ENCOUNTER — Encounter: Payer: Self-pay | Admitting: Cardiovascular Disease

## 2013-08-02 VITALS — BP 146/66 | HR 73 | Ht 66.0 in | Wt 135.0 lb

## 2013-08-02 DIAGNOSIS — I6340 Cerebral infarction due to embolism of unspecified cerebral artery: Secondary | ICD-10-CM

## 2013-08-02 DIAGNOSIS — I5032 Chronic diastolic (congestive) heart failure: Secondary | ICD-10-CM

## 2013-08-02 DIAGNOSIS — I10 Essential (primary) hypertension: Secondary | ICD-10-CM

## 2013-08-02 DIAGNOSIS — I2510 Atherosclerotic heart disease of native coronary artery without angina pectoris: Secondary | ICD-10-CM

## 2013-08-02 DIAGNOSIS — R0602 Shortness of breath: Secondary | ICD-10-CM

## 2013-08-02 DIAGNOSIS — I639 Cerebral infarction, unspecified: Secondary | ICD-10-CM

## 2013-08-02 DIAGNOSIS — I6350 Cerebral infarction due to unspecified occlusion or stenosis of unspecified cerebral artery: Secondary | ICD-10-CM

## 2013-08-02 DIAGNOSIS — I480 Paroxysmal atrial fibrillation: Secondary | ICD-10-CM

## 2013-08-02 DIAGNOSIS — I739 Peripheral vascular disease, unspecified: Secondary | ICD-10-CM

## 2013-08-02 DIAGNOSIS — I509 Heart failure, unspecified: Secondary | ICD-10-CM

## 2013-08-02 DIAGNOSIS — I4891 Unspecified atrial fibrillation: Secondary | ICD-10-CM

## 2013-08-02 LAB — HEPATIC FUNCTION PANEL: AST: 1 U/L — AB (ref 13–35)

## 2013-08-02 LAB — BASIC METABOLIC PANEL
BUN: 13 mg/dL (ref 4–21)
Creatinine: 0.6 mg/dL (ref 0.5–1.1)
Sodium: 140 mmol/L (ref 137–147)

## 2013-08-02 LAB — LIPID PANEL: Triglycerides: 188 mg/dL — AB (ref 40–160)

## 2013-08-02 LAB — CBC AND DIFFERENTIAL
HCT: 41 % (ref 36–46)
Hemoglobin: 13.9 g/dL (ref 12.0–16.0)

## 2013-08-02 NOTE — Assessment & Plan Note (Signed)
S/p left internal carotid artery stenting 03/25/11. Left sided Stent widely patent in March of this year. No significant disease on right side.

## 2013-08-02 NOTE — Assessment & Plan Note (Deleted)
Continue Pradaxa to prevent embolic stroke (last one in march of this year).

## 2013-08-02 NOTE — Patient Instructions (Signed)
Your physician wants you to follow-up in: 6 months with Laura Ingold NP and 1 year with Dr Berry.  You will receive a reminder letter in the mail two months in advance. If you don't receive a letter, please call our office to schedule the follow-up appointment.  

## 2013-08-02 NOTE — Assessment & Plan Note (Signed)
SBP mildly elevated. With history of hypotension and SBP <150, will not increase current therapies. Continue lisinopril 10mg , imdur 30mg , lasix 40mg  BID, diltiazem 120mg , and carvedilol 25mg  BID.

## 2013-08-02 NOTE — Progress Notes (Signed)
08/02/2013 Sara [name]   1918/09/19  [id number]  Primary Physician GREEN, Lenon Curt, MD Primary Cardiologist: Dr. Allyson Sabal  HPI:  79 year-old white female with complex medical history.  She presents today for shortness of breath when she is lying down for at least a month.    Prior History: She was hospitalized in January 2014 and had an above-the-knee amputation due to necrotic nonhealing wound (patient without PVD evaluation before amputation) then she was readmitted for syncope felt to be symptomatic hypotension (negative MRI for acute infarct). She had an echo and carotid Dopplers that were stable and no other issues. At that time, she was on aspirin and Plavix. Her carvedilol was down to 12.5 mg twice a day and her lisinopril was at 5 mg a day, previously had been at 40. She was readmitted in February due to dyspnea on exertion and nonhealing wound.  On December 03, 2012, she was readmitted with acute stroke (speech difficulties). She was found to be in A-fib with RVR and medications were adjusted including increasing her carvedilol back to 25 mg twice a day. She was discharged on December 05, 2012, and is in the skilled nursing facility at [facility] for rehab from her AKA as well as her CVA, though, she has not much residual deficits from the CVA. At the time of discharge, on December 03, 2012, she was in sinus rhythm. During that hospitalization she had A-fib with RVR but on arrival to [facility] she was back in sinus rhythm as well. Previously, we only had her on aspirin and Plavix, but due to the new right-sided CVA, most likely embolic from her A-fib, we added Pradaxa and stopped her aspirin completely. Additionally, in the past she has been diagnosed with psoriasis but she has been followed with Dr. 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 Other history does include coronary artery disease, significant 3-vessel disease and is not  felt to be a good bypass candidate. She was seen by Dr. Laneta Simmers in the past and medical therapy only has been recommended. She has also had a high-grade left internal carotid artery stenosis that Dr. Allyson Sabal stented July 2012 under the [facility] protocol. Dopplers during this year with hospitalization reveal that the stent was widely patent. Most recent echocardiogram in March 2013 showed EF 65% likely diastolic dysfunction and mild aortic regurgitation.  Pt recently underwent venous doppler on Lt leg negative for DVT but positive for 3.5 cm enlarged lymph node, concerning due to adenopathy only upper ext previously. This could be compressing venous system in Lt leg adding to edema.  Arterial doppler with decreased ABI on Lt from 12/2011, though not our dopplers.  Pt denied claudication.  She is in a wheel chair.  If wound healing becomes an issue or claudication we would do LEA dopplers in our office.  Interval history: Patient complains of shortness of breath each morning that is better once she gets up into her wheelchair. States this has bene present for over a month (a few days before her last visit). Describes a mild wheeze with this. She is uncertain if the additional doses of lasix provided much benefit. Daughter states she complains of this when she lays down at night too but that she is fine throughout the day. No accompanying chest pain recently per patient but daughter states she complained of it once last week and then a month ago. Patient had a  chest x-ray within the week with some mild pulmonary vascular congestion as well as atelectasis vs. Pneumonitis. She was treated with a course of azithromycin with minimal improvement.    Allergies  Allergen Reactions  . Evista [Raloxifene]   . Penicillins Other (See Comments)    unknown  . Triamterene Other (See Comments)    unknown  . Alendronate Sodium Rash    Pt stated oh k to use biotene    Current Outpatient Prescriptions  Medication Sig  Dispense Refill  . atorvastatin (LIPITOR) 40 MG tablet Take 1 tablet (40 mg total) by mouth daily at 6 PM.      . bexarotene (TARGRETIN) 75 MG CAPS capsule Take 150 mg by mouth daily before supper. Give with food. Protect from light. CAUTION: Chemotherapy/Biotherapy-Wear gloves when handling      . carvedilol (COREG) 25 MG tablet Take 25 mg by mouth 2 (two) times daily with a meal.       . dabigatran (PRADAXA) 150 MG CAPS Take 75 mg by mouth every 12 (twelve) hours.       Marland Kitchen diltiazem (CARDIZEM CD) 120 MG 24 hr capsule Take 120 mg by mouth daily. Take 1 tablet once daily for BP.      . famotidine (PEPCID) 20 MG tablet Take 40 mg by mouth daily.      . folic acid (FOLVITE) 1 MG tablet Take 1 mg by mouth daily.      . furosemide (LASIX) 40 MG tablet Take 1 tablet (40 mg total) by mouth 2 (two) times daily.  30 tablet  0  . gabapentin (NEURONTIN) 100 MG capsule Take 1 capsule (100 mg total) by mouth at bedtime.      . isosorbide mononitrate (IMDUR) 30 MG 24 hr tablet Take 1 tablet (30 mg total) by mouth daily.  30 tablet  6  . levocetirizine (XYZAL) 5 MG tablet Take 5 mg by mouth as needed for allergies.      Marland Kitchen lisinopril (PRINIVIL,ZESTRIL) 5 MG tablet Take 10 mg by mouth daily. Take 1 tablet daily for blood pressure and protect kidneys.      . nitroGLYCERIN (NITROSTAT) 0.4 MG SL tablet Place 0.4 mg under the tongue every 5 (five) minutes as needed for chest pain. If no relief call MD.      . Omega-3 Fatty Acids (FISH OIL) 1000 MG CAPS Take 1,000 mg by mouth at bedtime.       . polyethylene glycol (MIRALAX / GLYCOLAX) packet Take 17 g by mouth every other day. Take 1 packet mixed in 4 oz of beverage every day to relieve constipation.      . Polyvinyl Alcohol-Povidone (REFRESH OP) Apply 1 drop to eye 4 (four) times daily. Instill 1 drop to both eyes four times daily.*Wait 3-5 minutes between eye drops*      . vitamin C (ASCORBIC ACID) 500 MG tablet Take 500 mg by mouth daily.       No current  facility-administered medications for this visit.    Past Medical History  Diagnosis Date  . Hypertension   . Atrial fibrillation   . Psoriasis   . Congestive heart failure (CHF)   . History of CVA (cerebrovascular accident)   . Coronary artery disease   . Myocardial infarction   . CHF (congestive heart failure)   . Shortness of breath   . H/O hiatal hernia   . Closed fracture of head of left humerus 5/12  . Basal cell carcinoma   . Mycosis  fungoides involving lymph nodes of axilla and upper limb 05/29/2012    Diffuse cutaneous rash; desquamation skin palms & soles; WBC 15,000 50% lymphs; Hb 12' plattlets 244,000.  Flow cytometry 04/04/12: 91% cells CD4 positive CD 26 negative  . Hyperlipemia   . Pneumonia   . Wound of right leg 09/25/2012    Necrotic, open wound right lower leg; non healing  . Stroke   . Hyposmolality and/or hyponatremia   . Lower limb amputation, above knee 10/11/2012  . Pain in joint, multiple sites   . Mycosis fungoides, unspecified site, extranodal and solid organ sites 09/28/2012  . Occlusion and stenosis of carotid artery without mention of cerebral infarction 09/28/2012  . Chronic venous hypertension with ulcer and inflammation 09/28/2012  . Diverticulosis of colon (without mention of hemorrhage) 09/28/2012  . Synovial cyst of popliteal space 03/26/2012  . Unspecified constipation 03/24/2012  . Hypopotassemia 01/26/2012  . Debility, unspecified 01/17/2012  . Anxiety state, unspecified 12/31/2011  . Anemia, unspecified 12/21/2011  . Other specified disease of white blood cells 12/21/2011  . Peripheral vascular disease, unspecified 12/21/2011    recent ABI Lt of 0.64 down from ).80 on 12/29/11- though different labs  . Reflux esophagitis 12/21/2011  . Unspecified hereditary and idiopathic peripheral neuropathy 12/14/2011  . Pain in joint, ankle and foot 12/14/2011  . Carotid stenosis     CAROTID DOPPLER, 05/02/2012 - LEFT VERTEBRAL-occluded, LEFT BULB AND  PROXIMAL ICA STENT-moderate amount of irregular mixed dense plaque 50-69% diameter reduction  . Pre-syncope     NUCLEAR STRESS TEST, 12/10/2009 - normal, EKG negative for ischemia  . TIA (transient ischemic attack)     2D ECHO, 12/05/2012 - EF 60-65%, Severely calcified annulus of the mitral valve with mild-moderate regurgitation, LA moderate-severely dilated  . Paroxysmal atrial fibrillation   . CAD in native artery     3 vessel CAD, medical therapy, not a candidate for CABG  . S/P AKA (above knee amputation) unilateral, Rt leg due to PAD and non healing wound 06/27/2013    Past Surgical History  Procedure Laterality Date  . Tonsillectomy    . Dilation and curettage of uterus    . Eye surgery    . Cataracts    . Vertebroplasty    . Amputation  10/10/2012    Procedure: AMPUTATION ABOVE KNEE;  Surgeon: Nadara Mustard, MD;  Location: [facility] OR;  Service: Orthopedics;  Laterality: Right;  . Percutaneous stent intervention  04/13/2011    Left internal carotid artery stented with a 8x30 prcise nitinol self-expanding stent resulting reduction of 90% to less than 20% residual  . Cardiac catheterization  11/06/2010    CABG recommended, turned down by [facility] secondary to co morbidities    ZOX:[id number]:UJ colds or fevers, no weight changes Skin: skin with diffuse cutaneous rash, and desquamation of skin-palms and arms HEENT:no blurred vision, no congestion CV:see HPI PUL:see HPI GI:no diarrhea constipation or melena, no indigestion GU:no hematuria, no dysuria MS:no joint pain, no claudication but nonambulatory Neuro:no syncope, no lightheadedness Endo:no diabetes, no thyroid disease  PHYSICAL EXAM BP 146/66  Pulse 73  Ht 5\' 6"  (1.676 m)  Wt 135 lb (61.236 kg)  BMI 21.80 kg/m2 General:Pleasant affect, NAD Skin:Warm and dry, brisk capillary refill, diffuse rash all over body, with peeling of skin in palms and some on arms HEENT:normocephalic, sclera clear, mucus membranes moist Neck:supple, no  JVD sitting up (could not get onto table to evaluate at 45 degrees), no bruits  Heart:S1S2 RRR. 3/6 SEM  radiating to carotids heard best at RUSB. No click, rub or gallop Lungs: very minimal basilar rales, no rhonchi, or wheezes ZHY:QMVH, non tender Ext: Rt AKA,  lower ext edema in Lt calf muscle, pockets of edema,  2+ radial pulses. Neuro:alert and oriented, MAE, follows commands, + facial symmetry  EKG:SR with rate 73 (occasional PACs) with non specific ST changes minimal from last EKG  ASSESSMENT AND PLAN Chronic diastolic HF (heart failure) Regarding current shortness of breath, Patient with unclear benefit from increased lasix a month ago. She has 1+ edema bilateral but no obvious rales. Patient likely euvolemic. Her shortness of breath could be due to multiple etiologies including CHF, progressive CAD (patient not a candidate for CABG due to being poor candidate). Patient desires to not be aggressive in workup. She states she can tolerate her current level of shortness of breath. 80mg  BID would be a large dose for this patient and would put her at risk for dehydration or AKI. Decision made to continue current 40mg  dosing. Patient to follow up in 6 months with Nada Boozer.   Coronary artery disease Continue imdur 30mg  and atorvastatin. Nonoperative candidate for 3 vessel disease. Medical management only. Not on aspirin to reduce bleeding risk while on pradaxa due to high risk of falls.   PAF (paroxysmal atrial fibrillation) Currently in sinus rhythm. Continue cardizem 120mg  and carvedilol 25mg  BID. Pradaxa continued as well.    HTN (hypertension) SBP mildly elevated. With history of hypotension and SBP <150, will not increase current therapies. Continue lisinopril 10mg , imdur 30mg , lasix 40mg  BID, diltiazem 120mg , and carvedilol 25mg  BID.   PAD (peripheral artery disease) Wheelchair bound so difficult to evaluate claudication. No current superficial wounds. Patient would like to avoid  cardiac or vascular interventions. Will continue to monitor.   SOB (shortness of breath) See diastolic CHF discussion. Multiple potential etiologies. Will continue current level of lasix. Patient wants to avoid interventions so will not proceed with further studies such as stress test.   Stroke, acute, Rt brain, embolic March 2013 Continue Pradaxa to prevent embolic stroke (last one in march of this year).   PVD, Lt CA stent 04/02/11 S/p left internal carotid artery stenting 03/25/11. Left sided Stent widely patent in March of this year. No significant disease on right side.    Dr. Allyson Sabal has seen and evaluated the patient. We have discussed the history, exam, assessment, and plan as noted above. He agrees with management.   Aldine Contes. Marti Sleigh, MD, PGY3 08/02/2013 4:33 PM  Runell Gess, M.D., [facility] [address] [place], [place]  [address]  [phone] 08/02/2013 5:14 PM  08/02/2013 4:33 PM

## 2013-08-02 NOTE — Assessment & Plan Note (Addendum)
Regarding current shortness of breath, Patient with unclear benefit from increased lasix a month ago. She has 1+ edema bilateral but no obvious rales. Patient likely euvolemic. Her shortness of breath could be due to multiple etiologies including CHF, progressive CAD (patient not a candidate for CABG due to being poor candidate). Patient desires to not be aggressive in workup. She states she can tolerate her current level of shortness of breath. 80mg  BID would be a large dose for this patient and would put her at risk for dehydration or AKI. Decision made to continue current 40mg  dosing. Patient to follow up in 6 months with Nada Boozer.

## 2013-08-02 NOTE — Assessment & Plan Note (Deleted)
Patient with unclear benefit from increased lasix a month ago. Her shortness of breath could be due to multiple etiologies including CHF, progressive CAD (patient not a candidate for CABG due to being poor candidate), paroxysmal atrial fibrillation. Patient desires to not be aggressive in workup. She states she can tolerate her current level of shortness of breath. 80mg  BID would be a large dose for this patient and would put her at risk for dehydration or AKI. Decision made to continue current 40mg  dosing. Patient to follow up in 6 months with Nada Boozer.

## 2013-08-02 NOTE — Assessment & Plan Note (Signed)
Currently in sinus rhythm. Continue cardizem 120mg  and carvedilol 25mg  BID. Pradaxa continued as well.

## 2013-08-02 NOTE — Assessment & Plan Note (Addendum)
Wheelchair bound so difficult to evaluate claudication. No current superficial wounds. Patient would like to avoid cardiac or vascular interventions. Will continue to monitor.

## 2013-08-02 NOTE — Assessment & Plan Note (Addendum)
Continue Pradaxa to prevent embolic stroke (last one in march of this year).  

## 2013-08-02 NOTE — Assessment & Plan Note (Signed)
See diastolic CHF discussion. Multiple potential etiologies. Will continue current level of lasix. Patient wants to avoid interventions so will not proceed with further studies such as stress test.

## 2013-08-02 NOTE — Assessment & Plan Note (Signed)
Continue imdur 30mg  and atorvastatin. Nonoperative candidate for 3 vessel disease. Medical management only. Not on aspirin to reduce bleeding risk while on pradaxa due to high risk of falls.

## 2013-08-02 NOTE — Assessment & Plan Note (Deleted)
Currently in sinus rhythm. Continue cardizem 120mg and carvedilol 25mg BID. Pradaxa continued as well.   

## 2013-08-07 ENCOUNTER — Telehealth (HOSPITAL_COMMUNITY): Payer: Self-pay | Admitting: *Deleted

## 2013-08-13 ENCOUNTER — Non-Acute Institutional Stay (SKILLED_NURSING_FACILITY): Payer: Medicare Other | Admitting: Nurse Practitioner

## 2013-08-13 ENCOUNTER — Encounter: Payer: Self-pay | Admitting: Nurse Practitioner

## 2013-08-13 DIAGNOSIS — R059 Cough, unspecified: Secondary | ICD-10-CM | POA: Insufficient documentation

## 2013-08-13 DIAGNOSIS — D649 Anemia, unspecified: Secondary | ICD-10-CM

## 2013-08-13 DIAGNOSIS — I480 Paroxysmal atrial fibrillation: Secondary | ICD-10-CM

## 2013-08-13 DIAGNOSIS — K5900 Constipation, unspecified: Secondary | ICD-10-CM

## 2013-08-13 DIAGNOSIS — I4891 Unspecified atrial fibrillation: Secondary | ICD-10-CM

## 2013-08-13 DIAGNOSIS — I5032 Chronic diastolic (congestive) heart failure: Secondary | ICD-10-CM

## 2013-08-13 DIAGNOSIS — R05 Cough: Secondary | ICD-10-CM

## 2013-08-13 DIAGNOSIS — G547 Phantom limb syndrome without pain: Secondary | ICD-10-CM

## 2013-08-13 DIAGNOSIS — G546 Phantom limb syndrome with pain: Secondary | ICD-10-CM

## 2013-08-13 DIAGNOSIS — I10 Essential (primary) hypertension: Secondary | ICD-10-CM

## 2013-08-13 DIAGNOSIS — I2510 Atherosclerotic heart disease of native coronary artery without angina pectoris: Secondary | ICD-10-CM

## 2013-08-13 NOTE — Assessment & Plan Note (Signed)
Presented heart rate controlled on Cardizem 120mg and Carvedilol 25mg bid, takes Pradaxa 75mg bid       

## 2013-08-13 NOTE — Progress Notes (Signed)
Patient ID: Sara [name], female   DOB: [birth year], 79 y.o.   MRN: [id number]  Code Status: DNR  Chief Complaint:  Chief Complaint  Patient presents with  . Medical Managment of Chronic Issues    cough  . Acute Visit     HPI: to evaluate cough, occasional yellow sputum, and other chronic medical conditions.   Problem List Items Addressed This Visit   Unspecified constipation     Managed with MiraLax qod            Phantom limb pain (Chronic)     Right AKA--managed with Gabapentin 100mg  nightly.             PAF (paroxysmal atrial fibrillation)     Presented heart rate controlled on Cardizem 120mg  and Carvedilol 25mg  bid, takes Pradaxa 75mg  bid                HTN (hypertension)     Controlled on Lisinopril 10mg  along with Cardizem and lasix and Carvedilol with mild elevated Sbp in 140s.               Cough     Obtain CXR. DuoNeb tid for 3 days and Mucinex 600mg  bid for 5 days.     Coronary artery disease     Risk reduction with Atorvastatin 40mg  and Fish oil. Takes Imdur 30mg  daily.         Chronic diastolic HF (heart failure) (Chronic)     Takes Furosemide 40mg  bid, Carvedilol 25mg  bid, clinically compensated. BNP in 300s 06/21/13                Anemia - Primary     Resolved, Hgb 13.9 08/02/13       Review of Systems:  Review of Systems  Constitutional: Negative for fever, chills, weight loss, malaise/fatigue and diaphoresis.  HENT: Positive for hearing loss. Negative for congestion, ear discharge, ear pain and sore throat.   Eyes: Negative for pain, discharge and redness.  Respiratory: Positive for cough. Negative for sputum production, shortness of breath and wheezing.   Cardiovascular: Positive for leg swelling. Negative for chest pain, palpitations, orthopnea, claudication and PND.       Trace  Gastrointestinal: Negative for heartburn, nausea, vomiting, abdominal pain, diarrhea, constipation and blood in  stool.  Genitourinary: Positive for frequency (incontinent of bladder). Negative for dysuria, urgency, hematuria and flank pain.  Musculoskeletal: Positive for joint pain. Negative for back pain, falls, myalgias and neck pain.  Skin: Positive for itching and rash.       Chronic psoriasis, very thin and red skin, frequent skin abrasion or tear. Left wrist wart appearance skin lesion.   Neurological: Negative for dizziness, tingling, tremors, sensory change, speech change, focal weakness, seizures, loss of consciousness, weakness (generalized) and headaches.  Endo/Heme/Allergies: Negative for environmental allergies and polydipsia. Bruises/bleeds easily.  Psychiatric/Behavioral: Positive for memory loss. Negative for depression and hallucinations. The patient is not nervous/anxious and does not have insomnia.      Medications: Reviewed at [facility]    Physical Exam: Physical Exam  Constitutional: She is oriented to person, place, and time. She appears well-developed and well-nourished. No distress.  HENT:  Head: Normocephalic.  Right Ear: External ear normal.  Left Ear: External ear normal.  Nose: Nose normal.  Mouth/Throat: Oropharynx is clear and moist. No oropharyngeal exudate.  Eyes: EOM are normal. Pupils are equal, round, and reactive to light. Right conjunctiva is injected.  Only a few eyelashes remained and right  conjunctiva mild irritated with bilateral lower eyelid eversion.   Neck: Normal range of motion. Neck supple. JVD present. No thyromegaly present.  Cardiovascular: Normal rate, regular rhythm and normal heart sounds.  Exam reveals decreased pulses.   No murmur heard. Pulses:      Dorsalis pedis pulses are 1+ on the left side.       Posterior tibial pulses are 1+ on the left side.  Pulmonary/Chest: Effort normal and breath sounds normal. No respiratory distress. She has no wheezes. She exhibits no tenderness.  Decreased breath sounds posterior mid lungs.   Abdominal: Soft.  Bowel sounds are normal. She exhibits no distension. There is no tenderness.  Genitourinary:  Incontinent of bladder  Musculoskeletal: Normal range of motion. She exhibits edema. She exhibits no tenderness.  R AKA. Left lower leg edema appears new.   Lymphadenopathy:    She has no cervical adenopathy.  Neurological: She is alert and oriented to person, place, and time. She has normal reflexes. No cranial nerve deficit. She exhibits normal muscle tone. Coordination normal.  Skin: Rash noted. There is erythema.  Diffused erythematous scaly skin appearance. Psoriasis. Left wrist wart appearance skin lesion--f/u Dermatology.   Psychiatric: She has a normal mood and affect. Her behavior is normal. Judgment and thought content normal. She exhibits abnormal recent memory.     Filed Vitals:   08/13/13 1452  BP: 148/74  Pulse: 78  Temp: 97.6 F (36.4 C)  TempSrc: Tympanic  Resp: 20      Labs reviewed: Basic Metabolic Panel:  Recent Labs  16/10/96 1407 12/03/12 1421  01/23/13 0720  05/08/13 1420  06/12/13 1520  07/05/13 07/24/13 1415 08/02/13  NA 137 136  < > 135  < > 140  < > 142  < > 139 140 140  K 4.2 4.1  < > 4.2  < > 3.4*  < > 3.6  < > 4.0 4.0 4.3  CL 96 99  --  97  --   --   --   --   --   --   --   --   CO2 29  --   --  28  --  28  --  29  --   --  27  --   GLUCOSE 94 95  --  83  --  155*  --  115  --   --  136  --   BUN 25* 26*  < > 15  < > 10.9  < > 9.7  < > 14 12.7 13  CREATININE 0.58 0.70  < > 0.56  < > 0.8  < > 0.7  < > 0.6 0.7 0.6  CALCIUM 9.2  --   --  9.0  --  9.3  --  9.4  --   --  9.6  --   TSH  --   --   --   --   < >  --   < >  --   --  1.15 0.781 0.99  < > = values in this interval not displayed.  Liver Function Tests:  Recent Labs  05/08/13 1420  06/12/13 1520 07/05/13 07/24/13 1415 08/02/13  AST 16  < > 19 16 16  1*  ALT 12  < > 12 8 9 10   ALKPHOS 64  < > 68 48 64 63  BILITOT 0.27  --  0.29  --  0.38  --   PROT 6.9  --  7.1  --  6.8  --   ALBUMIN  3.2*  --  3.3*  --  3.5  --   < > = values in this interval not displayed.  CBC:  Recent Labs  05/08/13 1420  06/12/13 1520  07/05/13 07/24/13 1415 08/02/13  WBC 25.8*  < > 31.1*  < > 27.9 27.8* 27.0  NEUTROABS 6.6*  --  8.2*  --   --  7.8*  --   HGB 12.8  < > 13.0  < > 11.6* 11.8 13.9  HCT 38.9  < > 39.3  < > 35* 37.2 41  MCV 92.6  --  93.1  --   --  94.9  --   PLT 307  < > 253  < > 286 272 296  < > = values in this interval not displayed.   Significant Diagnostic Results:  12/22/12 CXR no evidence of acute infiltrate or residual infiltrate   06/22/13 LLE venous US negative for DVT  06/23/13 L ABI DTA 0.75, DPA 0.64   Assessment/Plan Anemia Resolved, Hgb 13.9 08/02/13  Chronic diastolic HF (heart failure) Takes Furosemide 40mg  bid, Carvedilol 25mg  bid, clinically compensated. BNP in 300s 06/21/13              HTN (hypertension) Controlled on Lisinopril 10mg  along with Cardizem and lasix and Carvedilol with mild elevated Sbp in 140s.             PAF (paroxysmal atrial fibrillation) Presented heart rate controlled on Cardizem 120mg  and Carvedilol 25mg  bid, takes Pradaxa 75mg  bid              Coronary artery disease Risk reduction with Atorvastatin 40mg  and Fish oil. Takes Imdur 30mg  daily.       Unspecified constipation Managed with MiraLax qod          Phantom limb pain Right AKA--managed with Gabapentin 100mg  nightly.           Cough Obtain CXR. DuoNeb tid for 3 days and Mucinex 600mg  bid for 5 days.       Family/ staff Communication: skin care. Dermatology when desires.    Goals of care: SNF and may ambulate with her prosthesis.    Labs/tests ordered CXR

## 2013-08-13 NOTE — Assessment & Plan Note (Signed)
Controlled on Lisinopril 10mg along with Cardizem and lasix and Carvedilol with mild elevated Sbp in 140s.        

## 2013-08-13 NOTE — Assessment & Plan Note (Signed)
Risk reduction with Atorvastatin 40mg and Fish oil. Takes Imdur 30mg daily.      

## 2013-08-13 NOTE — Assessment & Plan Note (Signed)
Managed with MiraLax qod   

## 2013-08-13 NOTE — Assessment & Plan Note (Signed)
Obtain CXR. DuoNeb tid for 3 days and Mucinex 600mg  bid for 5 days.

## 2013-08-13 NOTE — Assessment & Plan Note (Signed)
Takes Furosemide 40mg bid, Carvedilol 25mg bid, clinically compensated. BNP in 300s 06/21/13         

## 2013-08-13 NOTE — Assessment & Plan Note (Signed)
Right AKA--managed with Gabapentin 100mg nightly 

## 2013-08-13 NOTE — Assessment & Plan Note (Signed)
Resolved, Hgb 13.9 08/02/13   

## 2013-08-15 ENCOUNTER — Encounter: Payer: Self-pay | Admitting: Nurse Practitioner

## 2013-08-15 ENCOUNTER — Non-Acute Institutional Stay (SKILLED_NURSING_FACILITY): Payer: Medicare Other | Admitting: Nurse Practitioner

## 2013-08-15 DIAGNOSIS — I4891 Unspecified atrial fibrillation: Secondary | ICD-10-CM

## 2013-08-15 DIAGNOSIS — J209 Acute bronchitis, unspecified: Secondary | ICD-10-CM

## 2013-08-15 DIAGNOSIS — I2510 Atherosclerotic heart disease of native coronary artery without angina pectoris: Secondary | ICD-10-CM

## 2013-08-15 DIAGNOSIS — K5900 Constipation, unspecified: Secondary | ICD-10-CM

## 2013-08-15 DIAGNOSIS — I480 Paroxysmal atrial fibrillation: Secondary | ICD-10-CM

## 2013-08-15 DIAGNOSIS — I5032 Chronic diastolic (congestive) heart failure: Secondary | ICD-10-CM

## 2013-08-15 DIAGNOSIS — I10 Essential (primary) hypertension: Secondary | ICD-10-CM

## 2013-08-15 NOTE — Assessment & Plan Note (Signed)
Controlled on Lisinopril 10mg  along with Cardizem and lasix and Carvedilol with mild elevated Sbp in 140s.

## 2013-08-15 NOTE — Assessment & Plan Note (Signed)
Managed with MiraLax qod   

## 2013-08-15 NOTE — Assessment & Plan Note (Signed)
Avelox 400mg  daily for 7 days started 08/14/13. CXR 08/13/13 showed mild to moderate bronchitis at the perihilar regions.

## 2013-08-15 NOTE — Progress Notes (Addendum)
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 Single area of right axillary adenopathy 05/2012   Biopsies of (if applicable) revealed: See flow cytometry   Past/Anticipated interventions by medical oncology, if any: She began a trial of low-dose Targretin in January of this year. The dose was escalated to 150 mg daily following an office visit on 12/19/2012. The skin appeared improved at an office visit 01/16/2013 and she was experiencing significantly less pruritus. Targretin was continued. She was last seen in an office visit on 03/20/2013. Targretin continued at 150 mg daily per med on note 07/24/13.  Weight changes, if any, over the past 6 months: no   Recurrent fevers, or drenching night sweats, if any: no   SAFETY ISSUES:  Prior radiation? No  Pacemaker/ICD? no  Possible current pregnancy? No  Is the patient on methotrexate? No  Current Complaints / other details: Pt denies pain, skin irritation, fatigue, loss of appetite. Multiple cutaneous lesions at the anterior chest and left posterolateral chest wall. Pt continues to live at [facility] in SNF area.

## 2013-08-15 NOTE — Progress Notes (Signed)
Patient ID: Sara [name], female   DOB: 08/30/1919, 79 y.o.   MRN: [id number]   Code Status: DNR  Allergies  Allergen Reactions  . Evista [Raloxifene]   . Penicillins Other (See Comments)    unknown  . Triamterene Other (See Comments)    unknown  . Alendronate Sodium Rash    Pt stated oh k to use biotene    Chief Complaint  Patient presents with  . Medical Managment of Chronic Issues    acute bronchitis.   . Acute Visit    HPI: Patient is a 79 y.o. female seen in the SNF at [facility] today for evaluation of acute bronchitis and other chronic medical conditions.  Problem List Items Addressed This Visit   Unspecified constipation     Managed with MiraLax qod              PAF (paroxysmal atrial fibrillation)     Presented heart rate controlled on Cardizem 120mg  and Carvedilol 25mg  bid, takes Pradaxa 75mg  bid                  HTN (hypertension)     Controlled on Lisinopril 10mg  along with Cardizem and lasix and Carvedilol with mild elevated Sbp in 140s.                 Coronary artery disease     Risk reduction with Atorvastatin 40mg  and Fish oil. Takes Imdur 30mg  daily.         Chronic diastolic HF (heart failure) (Chronic)     Takes Furosemide 40mg  bid, Carvedilol 25mg  bid, clinically compensated. BNP in 300s 06/21/13                  Acute bronchitis - Primary     Avelox 400mg  daily for 7 days started 08/14/13. CXR 08/13/13 showed mild to moderate bronchitis at the perihilar regions.        Review of Systems:  Review of Systems  Constitutional: Negative for fever, chills, weight loss, malaise/fatigue and diaphoresis.  HENT: Positive for hearing loss. Negative for congestion, ear discharge, ear pain and sore throat.   Eyes: Negative for pain, discharge and redness.  Respiratory: Positive for cough. Negative for sputum production, shortness of breath and wheezing.   Cardiovascular: Positive for leg swelling.  Negative for chest pain, palpitations, orthopnea, claudication and PND.       Trace  Gastrointestinal: Negative for heartburn, nausea, vomiting, abdominal pain, diarrhea, constipation and blood in stool.  Genitourinary: Positive for frequency (incontinent of bladder). Negative for dysuria, urgency, hematuria and flank pain.  Musculoskeletal: Positive for joint pain. Negative for back pain, falls, myalgias and neck pain.  Skin: Positive for itching and rash.       Chronic psoriasis, very thin and red skin, frequent skin abrasion or tear. Left wrist wart appearance skin lesion.   Neurological: Negative for dizziness, tingling, tremors, sensory change, speech change, focal weakness, seizures, loss of consciousness, weakness (generalized) and headaches.  Endo/Heme/Allergies: Negative for environmental allergies and polydipsia. Bruises/bleeds easily.  Psychiatric/Behavioral: Positive for memory loss. Negative for depression and hallucinations. The patient is not nervous/anxious and does not have insomnia.      Past Medical History  Diagnosis Date  . Hypertension   . Atrial fibrillation   . Psoriasis   . Congestive heart failure (CHF)   . History of CVA (cerebrovascular accident)   . Coronary artery disease   . Myocardial infarction   . CHF (congestive heart failure)   .  Shortness of breath   . H/O hiatal hernia   . Closed fracture of head of left humerus 5/12  . Basal cell carcinoma   . 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  Flow cytometry 04/04/12: 91% cells CD4 positive CD 26 negative  . Hyperlipemia   . Pneumonia   . Wound of right leg 09/25/2012    Necrotic, open wound right lower leg; non healing  . Stroke   . Hyposmolality and/or hyponatremia   . Lower limb amputation, above knee 10/11/2012  . Pain in joint, multiple sites   . Mycosis  fungoides, unspecified site, extranodal and solid organ sites 09/28/2012  . Occlusion and stenosis of carotid artery without mention of cerebral infarction 09/28/2012  . Chronic venous hypertension with ulcer and inflammation 09/28/2012  . Diverticulosis of colon (without mention of hemorrhage) 09/28/2012  . Synovial cyst of popliteal space 03/26/2012  . Unspecified constipation 03/24/2012  . Hypopotassemia 01/26/2012  . Debility, unspecified 01/17/2012  . Anxiety state, unspecified 12/31/2011  . Anemia, unspecified 12/21/2011  . Other specified disease of white blood cells 12/21/2011  . Peripheral vascular disease, unspecified 12/21/2011    recent ABI Lt of 0.64 down from ).80 on 12/29/11- though different labs  . Reflux esophagitis 12/21/2011  . Unspecified hereditary and idiopathic peripheral neuropathy 12/14/2011  . Pain in joint, ankle and foot 12/14/2011  . Carotid stenosis     CAROTID DOPPLER, 05/02/2012 - LEFT VERTEBRAL-occluded, LEFT BULB AND PROXIMAL ICA STENT-moderate amount of irregular mixed dense plaque 50-69% diameter reduction  . Pre-syncope     NUCLEAR STRESS TEST, 12/10/2009 - normal, EKG negative for ischemia  . TIA (transient ischemic attack)     2D ECHO, 12/05/2012 - EF 60-65%, Severely calcified annulus of the mitral valve with mild-moderate regurgitation, LA moderate-severely dilated  . Paroxysmal atrial fibrillation   . CAD in native artery     3 vessel CAD, medical therapy, not a candidate for CABG  . S/P AKA (above knee amputation) unilateral, Rt leg due to PAD and non healing wound 06/27/2013  . HCAP (healthcare-associated pneumonia) 01/08/2012   Past Surgical History  Procedure Laterality Date  . Tonsillectomy    . Dilation and curettage of uterus    . Eye surgery    . Cataracts    . Vertebroplasty    . Amputation  10/10/2012    Procedure: AMPUTATION ABOVE KNEE;  Surgeon: Nadara Mustard, MD;  Location: [facility] OR;  Service: Orthopedics;  Laterality: Right;  .  Percutaneous stent intervention  04/13/2011    Left internal carotid artery stented with a 8x30 prcise nitinol self-expanding stent resulting reduction of 90% to less than 20% residual  . Cardiac catheterization  11/06/2010    CABG recommended, turned down by [facility] secondary to co morbidities   Social History:   reports that she has never smoked. She has never used smokeless tobacco. She reports that she does not drink alcohol or use illicit drugs.  Family History  Problem Relation Age of Onset  . Stomach cancer Mother   . Pneumonia Father   . Cirrhosis Brother   . Diabetes Brother   . Heart attack Paternal Grandmother   . Heart failure Sister   . Diabetes Sister   . Heart failure Sister     Medications: Patient's Medications  New Prescriptions   No medications on file  Previous Medications   ATORVASTATIN (LIPITOR) 40 MG TABLET    Take 1 tablet (40 mg total) by mouth daily at 6 PM.   BEXAROTENE (TARGRETIN) 75 MG CAPS CAPSULE    Take 150 mg by mouth daily before supper. Give with food. Protect from light. CAUTION: Chemotherapy/Biotherapy-Wear gloves when handling   CARVEDILOL (COREG) 25 MG TABLET    Take 25 mg by mouth 2 (two) times daily with a meal.    DABIGATRAN (PRADAXA) 150 MG CAPS    Take 75 mg by mouth every 12 (twelve) hours.    DILTIAZEM (CARDIZEM CD) 120 MG 24 HR CAPSULE    Take 120 mg by mouth daily. Take 1 tablet once daily for BP.   FAMOTIDINE (PEPCID) 20 MG TABLET    Take 40 mg by mouth daily.   FOLIC ACID (FOLVITE) 1 MG TABLET    Take 1 mg by mouth daily.   FUROSEMIDE (LASIX) 40 MG TABLET    Take 1 tablet (40 mg total) by mouth 2 (two) times daily.   GABAPENTIN (NEURONTIN) 100 MG CAPSULE    Take 1 capsule (100 mg total) by mouth at bedtime.   ISOSORBIDE MONONITRATE (IMDUR) 30 MG 24 HR TABLET    Take 1 tablet (30 mg total) by mouth daily.   LEVOCETIRIZINE (XYZAL) 5 MG TABLET    Take 5 mg by mouth as needed for allergies.   LISINOPRIL (PRINIVIL,ZESTRIL) 5 MG TABLET     Take 10 mg by mouth daily. Take 1 tablet daily for blood pressure and protect kidneys.   NITROGLYCERIN (NITROSTAT) 0.4 MG SL TABLET    Place 0.4 mg under the tongue every 5 (five) minutes as needed for chest pain. If no relief call MD.   OMEGA-3 FATTY ACIDS (FISH OIL) 1000 MG CAPS    Take 1,000 mg by mouth at bedtime.    POLYETHYLENE GLYCOL (MIRALAX / GLYCOLAX) PACKET    Take 17 g by mouth every other day. Take 1 packet mixed in 4 oz of beverage every day to relieve constipation.   POLYVINYL ALCOHOL-POVIDONE (REFRESH OP)    Apply 1 drop to eye 4 (four) times daily. Instill 1 drop to both eyes four times daily.*Wait 3-5 minutes between eye drops*   VITAMIN C (ASCORBIC ACID) 500 MG TABLET    Take 500 mg by mouth daily.  Modified Medications   No medications on file  Discontinued Medications   No medications on file     Physical Exam: Physical Exam  Constitutional: She is oriented to person, place, and time. She appears well-developed and well-nourished. No distress.  HENT:  Head: Normocephalic.  Right Ear: External ear normal.  Left Ear: External ear normal.  Nose: Nose normal.  Mouth/Throat: Oropharynx is clear and moist. No oropharyngeal exudate.  Eyes: EOM are normal. Pupils are equal, round, and reactive to light. Right conjunctiva is injected.  Only a few eyelashes remained and right conjunctiva mild irritated with bilateral lower eyelid eversion.   Neck: Normal range of motion. Neck supple. JVD present. No thyromegaly present.  Cardiovascular: Normal rate, regular rhythm and normal heart sounds.  Exam reveals decreased pulses.   No murmur heard. Pulses:      Dorsalis pedis pulses are 1+ on the left side.       Posterior tibial pulses are 1+ on the left side.  Pulmonary/Chest: Effort normal and breath sounds normal. No respiratory distress. She has no wheezes. She exhibits no tenderness.  Decreased breath sounds posterior mid lungs.  Abdominal: Soft. Bowel sounds are normal. She  exhibits no distension. There is no tenderness.  Genitourinary:  Incontinent of bladder  Musculoskeletal: Normal range of motion. She exhibits edema. She exhibits no tenderness.  R AKA. Left lower leg edema appears new.   Lymphadenopathy:    She has no cervical adenopathy.  Neurological: She is alert and oriented to person, place, and time. She has normal reflexes. No cranial nerve deficit. She exhibits normal muscle tone. Coordination normal.  Skin: Rash noted. There is erythema.  Diffused erythematous scaly skin appearance. Psoriasis. Left wrist wart appearance skin lesion--f/u Dermatology.   Psychiatric: She has a normal mood and affect. Her behavior is normal. Judgment and thought content normal. She exhibits abnormal recent memory.    Filed Vitals:   08/16/13 0857  BP: 153/76  Pulse: 75  Temp: 98.2 F (36.8 C)  TempSrc: Tympanic  Resp: 18  SpO2: 93%      Labs reviewed: Basic Metabolic Panel:  Recent Labs  40/98/11 1407 12/03/12 1421  01/23/13 0720  05/08/13 1420  06/12/13 1520  07/05/13 07/24/13 1415 08/02/13  NA 137 136  < > 135  < > 140  < > 142  < > 139 140 140  K 4.2 4.1  < > 4.2  < > 3.4*  < > 3.6  < > 4.0 4.0 4.3  CL 96 99  --  97  --   --   --   --   --   --   --   --   CO2 29  --   --  28  --  28  --  29  --   --  27  --   GLUCOSE 94 95  --  83  --  155*  --  115  --   --  136  --   BUN 25* 26*  < > 15  < > 10.9  < > 9.7  < > 14 12.7 13  CREATININE 0.58 0.70  < > 0.56  < > 0.8  < > 0.7  < > 0.6 0.7 0.6  CALCIUM 9.2  --   --  9.0  --  9.3  --  9.4  --   --  9.6  --   TSH  --   --   --   --   < >  --   < >  --   --  1.15 0.781 0.99  < > = values in this interval not displayed. Liver Function Tests:  Recent Labs  05/08/13 1420  06/12/13 1520 07/05/13 07/24/13 1415 08/02/13  AST 16  < > 19 16 16  1*  ALT 12  < > 12 8 9 10   ALKPHOS 64  < > 68 48 64 63  BILITOT 0.27  --  0.29  --  0.38  --   PROT 6.9  --  7.1  --  6.8  --   ALBUMIN 3.2*  --  3.3*  --   3.5  --   < > = values in this interval not displayed.  Recent Labs  07/24/13 1415  LIPASE 20  AMYLASE 20   CBC:  Recent Labs  05/08/13 1420  06/12/13 1520  07/05/13 07/24/13 1415 08/02/13  WBC 25.8*  < > 31.1*  < > 27.9 27.8* 27.0  NEUTROABS 6.6*  --  8.2*  --   --  7.8*  --   HGB 12.8  < > 13.0  < >  11.6* 11.8 13.9  HCT 38.9  < > 39.3  < > 35* 37.2 41  MCV 92.6  --  93.1  --   --  94.9  --   PLT 307  < > 253  < > 286 272 296  < > = values in this interval not displayed. Lipid Panel:  Recent Labs  10/06/12 0648 11/02/12 0530 12/04/12 0820 12/14/12  06/11/13 07/24/13 1415 08/02/13  CHOL 147 203* 218* 181  --   --   --   --   HDL 29* 30* 34* 30*  --   --   --   --   LDLCALC 92 138* 143* 106  --   --   --   --   TRIG 129 175* 207* 225*  < > 201* 248* 188*  CHOLHDL 5.1 6.8 6.4  --   --   --   --   --   < > = values in this interval not displayed.  Past Procedures:  08/13/13 CXR no cardiomegaly or pulmonary vascular congestion, small bilateral pleural effusions, patchy bibasilar atelectasis or pneumonitis significantly improved with only mil dresidual now seen, old granulomatous disease re demonstrated, mild to moderate bronchitis at the perihilar regions.    Assessment/Plan Acute bronchitis Avelox 400mg  daily for 7 days started 08/14/13. CXR 08/13/13 showed mild to moderate bronchitis at the perihilar regions.   Chronic diastolic HF (heart failure) Takes Furosemide 40mg  bid, Carvedilol 25mg  bid, clinically compensated. BNP in 300s 06/21/13                Coronary artery disease Risk reduction with Atorvastatin 40mg  and Fish oil. Takes Imdur 30mg  daily.       HTN (hypertension) Controlled on Lisinopril 10mg  along with Cardizem and lasix and Carvedilol with mild elevated Sbp in 140s.               PAF (paroxysmal atrial fibrillation) Presented heart rate controlled on Cardizem 120mg  and Carvedilol 25mg  bid, takes Pradaxa 75mg   bid                Unspecified constipation Managed with MiraLax qod              Family/ Staff Communication: observe the patient  Goals of Care: SNF  Labs/tests ordered: CXR done 08/13/13

## 2013-08-15 NOTE — Assessment & Plan Note (Signed)
Risk reduction with Atorvastatin 40mg  and Fish oil. Takes Imdur 30mg  daily.

## 2013-08-15 NOTE — Assessment & Plan Note (Signed)
Presented heart rate controlled on Cardizem 120mg and Carvedilol 25mg bid, takes Pradaxa 75mg bid       

## 2013-08-15 NOTE — Assessment & Plan Note (Signed)
Takes Furosemide 40mg bid, Carvedilol 25mg bid, clinically compensated. BNP in 300s 06/21/13         

## 2013-08-16 ENCOUNTER — Ambulatory Visit
Admission: RE | Admit: 2013-08-16 | Discharge: 2013-08-16 | Disposition: A | Discharge status: Home / Self Care | Payer: Medicare Other | Source: Ambulatory Visit | Attending: Radiation Oncology | Admitting: Radiation Oncology

## 2013-08-16 ENCOUNTER — Encounter: Payer: Self-pay | Admitting: Radiation Oncology

## 2013-08-16 VITALS — BP 189/78 | HR 72 | Temp 98.2°F | Resp 20 | Wt 135.3 lb

## 2013-08-16 DIAGNOSIS — C8409 Mycosis fungoides, extranodal and solid organ sites: Secondary | ICD-10-CM | POA: Insufficient documentation

## 2013-08-16 DIAGNOSIS — C8404 Mycosis fungoides, lymph nodes of axilla and upper limb: Secondary | ICD-10-CM

## 2013-08-16 NOTE — Progress Notes (Signed)
Please see the Nurse Progress Note in the MD Initial Consult Encounter for this patient. 

## 2013-08-16 NOTE — Progress Notes (Signed)
Followup note:  Sara [name] returns today for a followup visit after seeing her approximately 3 months ago in consultation for evaluation of her mycosis fungoides. She continues with her Targretin under the direction of Dr. Cyndie Chime. She remains clinically stable. She was seen one month ago by Lonna Cobb NP.  Physical examination: Alert and oriented. Filed Vitals:   08/16/13 1114  BP: 189/78  Pulse: 72  Temp: 98.2 F (36.8 C)  Resp: 20   There is diffuse erythema along her face, trunk, and extremities. Nodes: There remain a few small nodes within the left axilla and right axilla. None are greater than 1-1.5 cm in greatest diameter. Chest: There is diffuse erythema along the entire chest and there are clear papular was along an adjacent to the sternal region, almost all of them measuring less than 0.5-1.0 cm. There is no ulceration or tumor formation.  Laboratory data: Lab Results  Component Value Date   WBC 27.0 08/02/2013   HGB 13.9 08/02/2013   HCT 41 08/02/2013   MCV 94.9 07/24/2013   PLT 296 08/02/2013   Impression: I believe she is clinically stable. I assume that she was responding to herTargretin. There is no role for radiation therapy.  Plan: Followup through Dr. Cyndie Chime and Lonna Cobb NP.

## 2013-08-27 ENCOUNTER — Encounter (HOSPITAL_COMMUNITY): Payer: Self-pay | Admitting: Emergency Medicine

## 2013-08-27 ENCOUNTER — Emergency Department (HOSPITAL_COMMUNITY): Payer: Medicare Other

## 2013-08-27 ENCOUNTER — Inpatient Hospital Stay (HOSPITAL_COMMUNITY)
Admission: EM | Admit: 2013-08-27 | Discharge: 2013-08-30 | DRG: 291 | Disposition: A | Discharge status: Nursing Home (Includes ICF) | Payer: Medicare Other | Attending: Internal Medicine | Admitting: Internal Medicine

## 2013-08-27 DIAGNOSIS — Z880 Allergy status to penicillin: Secondary | ICD-10-CM

## 2013-08-27 DIAGNOSIS — C8409 Mycosis fungoides, extranodal and solid organ sites: Secondary | ICD-10-CM | POA: Diagnosis present

## 2013-08-27 DIAGNOSIS — Z8673 Personal history of transient ischemic attack (TIA), and cerebral infarction without residual deficits: Secondary | ICD-10-CM

## 2013-08-27 DIAGNOSIS — R0602 Shortness of breath: Secondary | ICD-10-CM

## 2013-08-27 DIAGNOSIS — J9801 Acute bronchospasm: Secondary | ICD-10-CM

## 2013-08-27 DIAGNOSIS — D72829 Elevated white blood cell count, unspecified: Secondary | ICD-10-CM

## 2013-08-27 DIAGNOSIS — I639 Cerebral infarction, unspecified: Secondary | ICD-10-CM

## 2013-08-27 DIAGNOSIS — R0609 Other forms of dyspnea: Secondary | ICD-10-CM

## 2013-08-27 DIAGNOSIS — C8404 Mycosis fungoides, lymph nodes of axilla and upper limb: Secondary | ICD-10-CM

## 2013-08-27 DIAGNOSIS — B079 Viral wart, unspecified: Secondary | ICD-10-CM

## 2013-08-27 DIAGNOSIS — R0789 Other chest pain: Secondary | ICD-10-CM | POA: Diagnosis present

## 2013-08-27 DIAGNOSIS — K5900 Constipation, unspecified: Secondary | ICD-10-CM

## 2013-08-27 DIAGNOSIS — I5033 Acute on chronic diastolic (congestive) heart failure: Principal | ICD-10-CM

## 2013-08-27 DIAGNOSIS — I480 Paroxysmal atrial fibrillation: Secondary | ICD-10-CM

## 2013-08-27 DIAGNOSIS — I10 Essential (primary) hypertension: Secondary | ICD-10-CM

## 2013-08-27 DIAGNOSIS — J209 Acute bronchitis, unspecified: Secondary | ICD-10-CM

## 2013-08-27 DIAGNOSIS — R600 Localized edema: Secondary | ICD-10-CM

## 2013-08-27 DIAGNOSIS — E785 Hyperlipidemia, unspecified: Secondary | ICD-10-CM | POA: Diagnosis present

## 2013-08-27 DIAGNOSIS — Z85828 Personal history of other malignant neoplasm of skin: Secondary | ICD-10-CM

## 2013-08-27 DIAGNOSIS — Z8709 Personal history of other diseases of the respiratory system: Secondary | ICD-10-CM

## 2013-08-27 DIAGNOSIS — I739 Peripheral vascular disease, unspecified: Secondary | ICD-10-CM

## 2013-08-27 DIAGNOSIS — I4891 Unspecified atrial fibrillation: Secondary | ICD-10-CM | POA: Diagnosis present

## 2013-08-27 DIAGNOSIS — G546 Phantom limb syndrome with pain: Secondary | ICD-10-CM

## 2013-08-27 DIAGNOSIS — C8400 Mycosis fungoides, unspecified site: Secondary | ICD-10-CM | POA: Diagnosis present

## 2013-08-27 DIAGNOSIS — I5032 Chronic diastolic (congestive) heart failure: Secondary | ICD-10-CM

## 2013-08-27 DIAGNOSIS — R05 Cough: Secondary | ICD-10-CM

## 2013-08-27 DIAGNOSIS — I509 Heart failure, unspecified: Secondary | ICD-10-CM

## 2013-08-27 DIAGNOSIS — J9620 Acute and chronic respiratory failure, unspecified whether with hypoxia or hypercapnia: Secondary | ICD-10-CM | POA: Diagnosis present

## 2013-08-27 DIAGNOSIS — Z8249 Family history of ischemic heart disease and other diseases of the circulatory system: Secondary | ICD-10-CM

## 2013-08-27 DIAGNOSIS — D649 Anemia, unspecified: Secondary | ICD-10-CM

## 2013-08-27 DIAGNOSIS — Z79899 Other long term (current) drug therapy: Secondary | ICD-10-CM

## 2013-08-27 DIAGNOSIS — I252 Old myocardial infarction: Secondary | ICD-10-CM

## 2013-08-27 DIAGNOSIS — R0600 Dyspnea, unspecified: Secondary | ICD-10-CM

## 2013-08-27 DIAGNOSIS — I2510 Atherosclerotic heart disease of native coronary artery without angina pectoris: Secondary | ICD-10-CM

## 2013-08-27 DIAGNOSIS — Z66 Do not resuscitate: Secondary | ICD-10-CM

## 2013-08-27 DIAGNOSIS — R059 Cough, unspecified: Secondary | ICD-10-CM

## 2013-08-27 DIAGNOSIS — S78119A Complete traumatic amputation at level between unspecified hip and knee, initial encounter: Secondary | ICD-10-CM

## 2013-08-27 LAB — CBC WITH DIFFERENTIAL/PLATELET
Basophils Relative: 0 % (ref 0–1)
HCT: 42 % (ref 36.0–46.0)
Hemoglobin: 14.2 g/dL (ref 12.0–15.0)
Lymphocytes Relative: 65 % — ABNORMAL HIGH (ref 12–46)
Lymphs Abs: 21.3 10*3/uL — ABNORMAL HIGH (ref 0.7–4.0)
MCHC: 33.8 g/dL (ref 30.0–36.0)
MCV: 92.9 fL (ref 78.0–100.0)
Monocytes Relative: 3 % (ref 3–12)
Neutro Abs: 10.2 10*3/uL — ABNORMAL HIGH (ref 1.7–7.7)
RDW: 13.3 % (ref 11.5–15.5)
WBC: 32.8 10*3/uL — ABNORMAL HIGH (ref 4.0–10.5)

## 2013-08-27 LAB — URINALYSIS W MICROSCOPIC + REFLEX CULTURE
Bilirubin Urine: NEGATIVE
Glucose, UA: NEGATIVE mg/dL
Hgb urine dipstick: NEGATIVE
Ketones, ur: NEGATIVE mg/dL
Leukocytes, UA: NEGATIVE
Protein, ur: NEGATIVE mg/dL
Specific Gravity, Urine: 1.01 (ref 1.005–1.030)
Urobilinogen, UA: 0.2 mg/dL (ref 0.0–1.0)
pH: 7.5 (ref 5.0–8.0)

## 2013-08-27 LAB — COMPREHENSIVE METABOLIC PANEL
ALT: 11 U/L (ref 0–35)
AST: 16 U/L (ref 0–37)
Albumin: 3.6 g/dL (ref 3.5–5.2)
Alkaline Phosphatase: 76 U/L (ref 39–117)
CO2: 31 mEq/L (ref 19–32)
Calcium: 9.5 mg/dL (ref 8.4–10.5)
Chloride: 99 mEq/L (ref 96–112)
GFR calc Af Amer: 90 mL/min — ABNORMAL LOW (ref >90)
GFR calc non Af Amer: 78 mL/min — ABNORMAL LOW (ref >90)
Glucose, Bld: 90 mg/dL (ref 70–99)
Sodium: 141 mEq/L (ref 135–145)
Total Bilirubin: 0.3 mg/dL (ref 0.3–1.2)

## 2013-08-27 LAB — TROPONIN I
Troponin I: <0.3 ng/mL (ref <0.30)
Troponin I: <0.3 ng/mL (ref <0.30)

## 2013-08-27 LAB — MRSA PCR SCREENING: MRSA by PCR: POSITIVE — AB

## 2013-08-27 LAB — D-DIMER, QUANTITATIVE

## 2013-08-27 LAB — PATHOLOGIST SMEAR REVIEW

## 2013-08-27 LAB — PRO B NATRIURETIC PEPTIDE: Pro B Natriuretic peptide (BNP): 2534 pg/mL — ABNORMAL HIGH (ref 0–450)

## 2013-08-27 MED ORDER — ALBUTEROL SULFATE (5 MG/ML) 0.5% IN NEBU
2.5000 mg | INHALATION_SOLUTION | Freq: Four times a day (QID) | RESPIRATORY_TRACT | Status: DC
  Administered 2013-08-27: 20:00:00 2.5 mg via RESPIRATORY_TRACT
  Filled 2013-08-27: qty 0.5

## 2013-08-27 MED ORDER — POLYVINYL ALCOHOL 1.4 % OP SOLN
1.0000 [drp] | Freq: Three times a day (TID) | OPHTHALMIC | Status: DC
  Administered 2013-08-27 – 2013-08-30 (×9): 1 [drp] via OPHTHALMIC
  Filled 2013-08-27: qty 15

## 2013-08-27 MED ORDER — DILTIAZEM HCL ER COATED BEADS 120 MG PO CP24
120.0000 mg | ORAL_CAPSULE | Freq: Every day | ORAL | Status: DC
Start: 2013-08-27 — End: 2013-08-30
  Administered 2013-08-27 – 2013-08-30 (×4): 120 mg via ORAL
  Filled 2013-08-27 (×4): qty 1

## 2013-08-27 MED ORDER — IPRATROPIUM BROMIDE 0.02 % IN SOLN
0.5000 mg | Freq: Once | RESPIRATORY_TRACT | Status: AC
  Administered 2013-08-27: 0.5 mg via RESPIRATORY_TRACT
  Filled 2013-08-27: qty 2.5

## 2013-08-27 MED ORDER — ALBUTEROL SULFATE (5 MG/ML) 0.5% IN NEBU
5.0000 mg | INHALATION_SOLUTION | Freq: Once | RESPIRATORY_TRACT | Status: AC
  Administered 2013-08-27: 5 mg via RESPIRATORY_TRACT
  Filled 2013-08-27: qty 1

## 2013-08-27 MED ORDER — VITAMINS A & D EX OINT
TOPICAL_OINTMENT | CUTANEOUS | Status: AC
  Administered 2013-08-27: 5
  Filled 2013-08-27: qty 5

## 2013-08-27 MED ORDER — FOLIC ACID 1 MG PO TABS
1.0000 mg | ORAL_TABLET | Freq: Every day | ORAL | Status: DC
  Administered 2013-08-27 – 2013-08-30 (×4): 1 mg via ORAL
  Filled 2013-08-27 (×4): qty 1

## 2013-08-27 MED ORDER — DABIGATRAN ETEXILATE MESYLATE 75 MG PO CAPS
75.0000 mg | ORAL_CAPSULE | Freq: Two times a day (BID) | ORAL | Status: DC
  Administered 2013-08-27 – 2013-08-30 (×6): 75 mg via ORAL
  Filled 2013-08-27 (×7): qty 1

## 2013-08-27 MED ORDER — MUPIROCIN 2 % EX OINT
1.0000 "application " | TOPICAL_OINTMENT | Freq: Two times a day (BID) | CUTANEOUS | Status: DC
  Administered 2013-08-27 – 2013-08-30 (×6): 1 via NASAL
  Filled 2013-08-27: qty 22

## 2013-08-27 MED ORDER — SODIUM CHLORIDE 0.9 % IJ SOLN
3.0000 mL | Freq: Two times a day (BID) | INTRAMUSCULAR | Status: DC
  Administered 2013-08-27 – 2013-08-30 (×6): 3 mL via INTRAVENOUS

## 2013-08-27 MED ORDER — FUROSEMIDE 10 MG/ML IJ SOLN
40.0000 mg | Freq: Two times a day (BID) | INTRAMUSCULAR | Status: DC
  Administered 2013-08-27 – 2013-08-30 (×6): 40 mg via INTRAVENOUS
  Filled 2013-08-27 (×7): qty 4

## 2013-08-27 MED ORDER — ATORVASTATIN CALCIUM 40 MG PO TABS
40.0000 mg | ORAL_TABLET | Freq: Every day | ORAL | Status: DC
  Administered 2013-08-27 – 2013-08-29 (×3): 40 mg via ORAL
  Filled 2013-08-27 (×4): qty 1

## 2013-08-27 MED ORDER — BEXAROTENE 75 MG PO CAPS
150.0000 mg | ORAL_CAPSULE | Freq: Every day | ORAL | Status: DC
  Administered 2013-08-28 – 2013-08-29 (×2): 150 mg via ORAL

## 2013-08-27 MED ORDER — ISOSORBIDE MONONITRATE ER 30 MG PO TB24
30.0000 mg | ORAL_TABLET | Freq: Every day | ORAL | Status: DC
  Administered 2013-08-27 – 2013-08-29 (×3): 30 mg via ORAL
  Filled 2013-08-27 (×3): qty 1

## 2013-08-27 MED ORDER — FAMOTIDINE 40 MG PO TABS
40.0000 mg | ORAL_TABLET | Freq: Every day | ORAL | Status: DC
  Administered 2013-08-27 – 2013-08-30 (×4): 40 mg via ORAL
  Filled 2013-08-27 (×4): qty 1

## 2013-08-27 MED ORDER — CARBOXYMETHYLCELLULOSE SODIUM 1 % OP SOLN: 1.0000 [drp] | Freq: Three times a day (TID) | OPHTHALMIC | Status: DC

## 2013-08-27 MED ORDER — GABAPENTIN 100 MG PO CAPS
100.0000 mg | ORAL_CAPSULE | Freq: Every day | ORAL | Status: DC
  Administered 2013-08-27 – 2013-08-29 (×3): 100 mg via ORAL
  Filled 2013-08-27 (×4): qty 1

## 2013-08-27 MED ORDER — CARVEDILOL 25 MG PO TABS
25.0000 mg | ORAL_TABLET | Freq: Two times a day (BID) | ORAL | Status: DC
  Administered 2013-08-27 – 2013-08-29 (×5): 25 mg via ORAL
  Filled 2013-08-27 (×6): qty 1

## 2013-08-27 MED ORDER — LEVOCETIRIZINE DIHYDROCHLORIDE 5 MG PO TABS: 5.0000 mg | ORAL_TABLET | ORAL | Status: DC | PRN

## 2013-08-27 MED ORDER — ACETAMINOPHEN 325 MG PO TABS: 650.0000 mg | ORAL_TABLET | ORAL | Status: DC | PRN

## 2013-08-27 MED ORDER — GUAIFENESIN ER 600 MG PO TB12
600.0000 mg | ORAL_TABLET | Freq: Two times a day (BID) | ORAL | Status: DC
  Filled 2013-08-27: qty 1

## 2013-08-27 MED ORDER — SODIUM CHLORIDE 0.9 % IV SOLN: 250.0000 mL | INTRAVENOUS | Status: DC | PRN

## 2013-08-27 MED ORDER — FUROSEMIDE 10 MG/ML IJ SOLN
60.0000 mg | Freq: Once | INTRAMUSCULAR | Status: AC
  Administered 2013-08-27: 60 mg via INTRAVENOUS
  Filled 2013-08-27: qty 8

## 2013-08-27 MED ORDER — SACCHAROMYCES BOULARDII 250 MG PO CAPS: 250.0000 mg | ORAL_CAPSULE | Freq: Two times a day (BID) | ORAL | Status: DC

## 2013-08-27 MED ORDER — POLYETHYLENE GLYCOL 3350 17 G PO PACK
17.0000 g | PACK | ORAL | Status: DC
  Administered 2013-08-28 – 2013-08-30 (×2): 17 g via ORAL
  Filled 2013-08-27 (×2): qty 1

## 2013-08-27 MED ORDER — VITAMIN C 500 MG PO TABS
500.0000 mg | ORAL_TABLET | Freq: Every day | ORAL | Status: DC
  Administered 2013-08-28 – 2013-08-30 (×3): 500 mg via ORAL
  Filled 2013-08-27 (×3): qty 1

## 2013-08-27 MED ORDER — ALBUTEROL SULFATE (5 MG/ML) 0.5% IN NEBU: 5.0000 mg | INHALATION_SOLUTION | RESPIRATORY_TRACT | Status: DC | PRN

## 2013-08-27 MED ORDER — HYDROCODONE-ACETAMINOPHEN 5-325 MG PO TABS: 1.0000 | ORAL_TABLET | Freq: Four times a day (QID) | ORAL | Status: DC | PRN

## 2013-08-27 MED ORDER — LISINOPRIL 10 MG PO TABS
10.0000 mg | ORAL_TABLET | Freq: Every day | ORAL | Status: DC
  Administered 2013-08-27 – 2013-08-30 (×4): 10 mg via ORAL
  Filled 2013-08-27 (×4): qty 1

## 2013-08-27 MED ORDER — OMEGA-3-ACID ETHYL ESTERS 1 G PO CAPS
1.0000 g | ORAL_CAPSULE | Freq: Every day | ORAL | Status: DC
  Administered 2013-08-27 – 2013-08-30 (×4): 1 g via ORAL
  Filled 2013-08-27 (×4): qty 1

## 2013-08-27 MED ORDER — SODIUM CHLORIDE 0.9 % IJ SOLN: 3.0000 mL | INTRAMUSCULAR | Status: DC | PRN

## 2013-08-27 MED ORDER — IPRATROPIUM BROMIDE 0.02 % IN SOLN
0.5000 mg | Freq: Four times a day (QID) | RESPIRATORY_TRACT | Status: DC
  Administered 2013-08-27: 0.5 mg via RESPIRATORY_TRACT
  Filled 2013-08-27: qty 2.5

## 2013-08-27 MED ORDER — NITROGLYCERIN 2 % TD OINT
1.0000 [in_us] | TOPICAL_OINTMENT | Freq: Once | TRANSDERMAL | Status: AC
  Administered 2013-08-27: 1 [in_us] via TOPICAL
  Filled 2013-08-27: qty 30

## 2013-08-27 MED ORDER — VITAMINS A & D EX OINT
TOPICAL_OINTMENT | CUTANEOUS | Status: AC
  Filled 2013-08-27: qty 5

## 2013-08-27 MED ORDER — CHLORHEXIDINE GLUCONATE CLOTH 2 % EX PADS
6.0000 | MEDICATED_PAD | Freq: Every day | CUTANEOUS | Status: DC
  Administered 2013-08-28 – 2013-08-29 (×2): 6 via TOPICAL

## 2013-08-27 MED ORDER — ONDANSETRON HCL 4 MG/2ML IJ SOLN: 4.0000 mg | Freq: Four times a day (QID) | INTRAMUSCULAR | Status: DC | PRN

## 2013-08-27 NOTE — ED Provider Notes (Signed)
CSN: [id number]     Arrival date & time 08/27/13  0940 History   First MD Initiated Contact with Patient 08/27/13 [phone]     Chief Complaint  Patient presents with  . Shortness of Breath   (Consider location/radiation/quality/duration/timing/severity/associated sxs/prior Treatment) HPI  Patient reports she felt fine this morning until the nursing home staff got her up for the day. She reports she feels short of breath. She states she started having a cough about 2 weeks ago and was treated for bronchitis. She states her cough got better but she still has a cough. She denies chest pain, nausea, vomiting, diaphoresis, fever, sore throat, runny nose. She denies any abdominal swelling. She states she has swelling in her left lower leg off and on. She states this feels like when she has her atrial fibrillation. She reports when EMS placed her on oxygen it made her feel better, patient does not routinely wear oxygen. She denies wheezing however she states medical staff and told her she is having wheezing. EMS gave her a nebulizer treatment in route which she states helped.  [facility] Dr Gordy Levan PCP Dr Renne Crigler Cardiologist Dr Allyson Sabal  Past Medical History  Diagnosis Date  . Hypertension   . Atrial fibrillation   . Psoriasis   . Congestive heart failure (CHF)   . History of CVA (cerebrovascular accident)   . Coronary artery disease   . Myocardial infarction   . CHF (congestive heart failure)   . Shortness of breath   . H/O hiatal hernia   . Closed fracture of head of left humerus 5/12  . Basal cell carcinoma   . 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  Flow cytometry 04/04/12: 91% cells CD4 positive CD 26 negative  . Hyperlipemia   . Pneumonia   . Wound of right leg 09/25/2012    Necrotic, open wound right lower leg; non healing  . Stroke   . Hyposmolality  and/or hyponatremia   . Lower limb amputation, above knee 10/11/2012  . Pain in joint, multiple sites   . Mycosis fungoides, unspecified site, extranodal and solid organ sites 09/28/2012  . Occlusion and stenosis of carotid artery without mention of cerebral infarction 09/28/2012  . Chronic venous hypertension with ulcer and inflammation 09/28/2012  . Diverticulosis of colon (without mention of hemorrhage) 09/28/2012  . Synovial cyst of popliteal space 03/26/2012  . Unspecified constipation 03/24/2012  . Hypopotassemia 01/26/2012  . Debility, unspecified 01/17/2012  . Anxiety state, unspecified 12/31/2011  . Anemia, unspecified 12/21/2011  . Other specified disease of white blood cells 12/21/2011  . Peripheral vascular disease, unspecified 12/21/2011    recent ABI Lt of 0.64 down from ).80 on 12/29/11- though different labs  . Reflux esophagitis 12/21/2011  . Unspecified hereditary and idiopathic peripheral neuropathy 12/14/2011  . Pain in joint, ankle and foot 12/14/2011  . Carotid stenosis     CAROTID DOPPLER, 05/02/2012 - LEFT VERTEBRAL-occluded, LEFT BULB AND PROXIMAL ICA STENT-moderate amount of irregular mixed dense plaque 50-69% diameter reduction  . Pre-syncope     NUCLEAR STRESS TEST, 12/10/2009 - normal, EKG negative for ischemia  . TIA (transient ischemic attack)     2D ECHO, 12/05/2012 - EF 60-65%, Severely calcified annulus of the mitral valve with mild-moderate regurgitation, LA moderate-severely dilated  . Paroxysmal atrial fibrillation   . CAD in native artery  3 vessel CAD, medical therapy, not a candidate for CABG  . S/P AKA (above knee amputation) unilateral, Rt leg due to PAD and non healing wound 06/27/2013  . HCAP (healthcare-associated pneumonia) 01/08/2012   Past Surgical History  Procedure Laterality Date  . Tonsillectomy    . Dilation and curettage of uterus    . Eye surgery    . Cataracts    . Vertebroplasty    . Amputation  10/10/2012    Procedure: AMPUTATION  ABOVE KNEE;  Surgeon: Nadara Mustard, MD;  Location: [facility] OR;  Service: Orthopedics;  Laterality: Right;  . Percutaneous stent intervention  04/13/2011    Left internal carotid artery stented with a 8x30 prcise nitinol self-expanding stent resulting reduction of 90% to less than 20% residual  . Cardiac catheterization  11/06/2010    CABG recommended, turned down by [facility] secondary to co morbidities   Family History  Problem Relation Age of Onset  . Stomach cancer Mother   . Pneumonia Father   . Cirrhosis Brother   . Diabetes Brother   . Heart attack Paternal Grandmother   . Heart failure Sister   . Diabetes Sister   . Heart failure Sister    History  Substance Use Topics  . Smoking status: Never Smoker   . Smokeless tobacco: Never Used  . Alcohol Use: No   Lives in SNL Pt has a prosthetic leg for her Rt AKA but she has not been taught how to walk with it She is not on oxygen   OB History   Grav Para Term Preterm Abortions TAB SAB Ect Mult Living                 Review of Systems  All other systems reviewed and are negative.    Allergies  Evista; Penicillins; Triamterene; and Alendronate sodium  Home Medications   Current Outpatient Rx  Name  Route  Sig  Dispense  Refill  . atorvastatin (LIPITOR) 40 MG tablet   Oral   Take 1 tablet (40 mg total) by mouth daily at 6 PM.         . bexarotene (TARGRETIN) 75 MG CAPS capsule   Oral   Take 150 mg by mouth daily before supper. Give with food. Protect from light. CAUTION: Chemotherapy/Biotherapy-Wear gloves when handling         . carvedilol (COREG) 25 MG tablet   Oral   Take 25 mg by mouth 2 (two) times daily with a meal.          . dabigatran (PRADAXA) 150 MG CAPS   Oral   Take 75 mg by mouth every 12 (twelve) hours.          Marland Kitchen diltiazem (CARDIZEM CD) 120 MG 24 hr capsule   Oral   Take 120 mg by mouth daily. Take 1 tablet once daily for BP.         . famotidine (PEPCID) 20 MG tablet   Oral   Take 40 mg  by mouth daily.         . folic acid (FOLVITE) 1 MG tablet   Oral   Take 1 mg by mouth daily.         . furosemide (LASIX) 40 MG tablet   Oral   Take 1 tablet (40 mg total) by mouth 2 (two) times daily.   30 tablet   0   . gabapentin (NEURONTIN) 100 MG capsule   Oral   Take 1 capsule (100  mg total) by mouth at bedtime.         Marland Kitchen guaiFENesin (MUCINEX) 600 MG 12 hr tablet   Oral   Take by mouth 2 (two) times daily.         Marland Kitchen HYDROcodone-acetaminophen (NORCO/VICODIN) 5-325 MG per tablet   Oral   Take 1 tablet by mouth every 6 (six) hours as needed for moderate pain.         Marland Kitchen ipratropium-albuterol (DUONEB) 0.5-2.5 (3) MG/3ML SOLN   Nebulization   Take 3 mLs by nebulization.         . isosorbide mononitrate (IMDUR) 30 MG 24 hr tablet   Oral   Take 1 tablet (30 mg total) by mouth daily.   30 tablet   6   . levocetirizine (XYZAL) 5 MG tablet   Oral   Take 5 mg by mouth as needed for allergies.         Marland Kitchen lisinopril (PRINIVIL,ZESTRIL) 10 MG tablet   Oral   Take 10 mg by mouth daily.         Marland Kitchen moxifloxacin (AVELOX) 400 MG tablet   Oral   Take 400 mg by mouth daily at 8 pm.         . Omega-3 Fatty Acids (FISH OIL) 1000 MG CAPS   Oral   Take 1,000 mg by mouth at bedtime.          . polyethylene glycol (MIRALAX / GLYCOLAX) packet   Oral   Take 17 g by mouth every other day. Take 1 packet mixed in 4 oz of beverage every day to relieve constipation.         . Polyvinyl Alcohol-Povidone (REFRESH OP)   Ophthalmic   Apply 1 drop to eye 4 (four) times daily. Instill 1 drop to both eyes four times daily.*Wait 3-5 minutes between eye drops*         . saccharomyces boulardii (FLORASTOR) 250 MG capsule   Oral   Take 250 mg by mouth 2 (two) times daily.         . vitamin C (ASCORBIC ACID) 500 MG tablet   Oral   Take 500 mg by mouth daily.         . nitroGLYCERIN (NITROSTAT) 0.4 MG SL tablet   Sublingual   Place 0.4 mg under the tongue every 5  (five) minutes as needed for chest pain. If no relief call MD.          BP 225/92  Pulse 80  Temp(Src) 97.5 F (36.4 C) (Oral)  Resp 17  SpO2 97%  Vital signs normal except hypertension  Physical Exam  Nursing note and vitals reviewed. Constitutional: She is oriented to person, place, and time. She appears well-developed and well-nourished.  Non-toxic appearance. She does not appear ill. No distress.  HENT:  Head: Normocephalic and atraumatic.  Right Ear: External ear normal.  Left Ear: External ear normal.  Nose: Nose normal. No mucosal edema or rhinorrhea.  Mouth/Throat: Oropharynx is clear and moist and mucous membranes are normal. No dental abscesses or uvula swelling.  Eyes: Conjunctivae and EOM are normal. Pupils are equal, round, and reactive to light.  Neck: Normal range of motion and full passive range of motion without pain. Neck supple.  Cardiovascular: Normal rate, regular rhythm and normal heart sounds.  Exam reveals no gallop and no friction rub.   No murmur heard. Pulmonary/Chest: Effort normal. No respiratory distress. She has decreased breath sounds. She has wheezes. She has no  rhonchi. She has no rales. She exhibits no tenderness and no crepitus.  Audible wheezing at times.   Abdominal: Soft. Normal appearance and bowel sounds are normal. She exhibits no distension. There is no tenderness. There is no rebound and no guarding.  Musculoskeletal: Normal range of motion. She exhibits no edema and no tenderness.  Moves all extremities well. Pt is s/p right AKA  Neurological: She is alert and oriented to person, place, and time. She has normal strength. No cranial nerve deficit.  Skin: Skin is warm, dry and intact. No rash noted. No erythema. No pallor.  Pt's skin is diffusely red, she states it is from her lymphoma  Psychiatric: She has a normal mood and affect. Her speech is normal and behavior is normal. Her mood appears not anxious.    ED Course  Procedures  (including critical care time)  Medications  albuterol (PROVENTIL) (5 MG/ML) 0.5% nebulizer solution 5 mg (5 mg Nebulization Given 08/27/13 1023)  ipratropium (ATROVENT) nebulizer solution 0.5 mg (0.5 mg Nebulization Given 08/27/13 1023)  nitroGLYCERIN (NITROGLYN) 2 % ointment 1 inch (1 inch Topical Given 08/27/13 1219)  albuterol (PROVENTIL) (5 MG/ML) 0.5% nebulizer solution 5 mg (5 mg Nebulization Given 08/27/13 1327)  ipratropium (ATROVENT) nebulizer solution 0.5 mg (0.5 mg Nebulization Given 08/27/13 1327)  furosemide (LASIX) injection 60 mg (60 mg Intravenous Given 08/27/13 1528)  albuterol (PROVENTIL) (5 MG/ML) 0.5% nebulizer solution 5 mg (5 mg Nebulization Given 08/27/13 1444)  ipratropium (ATROVENT) nebulizer solution 0.5 mg (0.5 mg Nebulization Given 08/27/13 1444)   Given IV Lasix and had nitroglycerin placed applied for her congestive heart failure. She was given nebulizers for bronchospasm.  Recheck after first nebulizer shows diffuse wheezing, sometimes audible.   Recheck after second nebulizer shows diffuse wheezing. But she states she is feeling alittle better. We discussed she may need to be admitted for continued wheezing and SOB.   16:08 Dr Tat, admit to tele, team 8  Labs Review Results for orders placed during the hospital encounter of 08/27/13  CBC WITH DIFFERENTIAL      Result Value Range   WBC 32.8 (*) 4.0 - 10.5 K/uL   RBC 4.52  3.87 - 5.11 MIL/uL   Hemoglobin 14.2  12.0 - 15.0 g/dL   HCT 04.5  40.9 - 81.1 %   MCV 92.9  78.0 - 100.0 fL   MCH 31.4  26.0 - 34.0 pg   MCHC 33.8  30.0 - 36.0 g/dL   RDW 91.4  78.2 - 95.6 %   Platelets 287  150 - 400 K/uL   Neutrophils Relative % 31 (*) 43 - 77 %   Lymphocytes Relative 65 (*) 12 - 46 %   Monocytes Relative 3  3 - 12 %   Eosinophils Relative 1  0 - 5 %   Basophils Relative 0  0 - 1 %   Neutro Abs 10.2 (*) 1.7 - 7.7 K/uL   Lymphs Abs 21.3 (*) 0.7 - 4.0 K/uL   Monocytes Absolute 1.0  0.1 - 1.0 K/uL    Eosinophils Absolute 0.3  0.0 - 0.7 K/uL   Basophils Absolute 0.0  0.0 - 0.1 K/uL   WBC Morphology ABSOLUTE LYMPHOCYTOSIS    PRO B NATRIURETIC PEPTIDE      Result Value Range   Pro B Natriuretic peptide (BNP) 2534.0 (*) 0 - 450 pg/mL  D-DIMER, QUANTITATIVE      Result Value Range   D-Dimer, Quant    0.00 - 0.48 ug/mL-FEU  Value: SPECIMEN HEMOLYZED. HEMOLYSIS MAY AFFECT INTEGRITY OF RESULTS.  COMPREHENSIVE METABOLIC PANEL      Result Value Range   Sodium 141  135 - 145 mEq/L   Potassium 3.6  3.5 - 5.1 mEq/L   Chloride 99  96 - 112 mEq/L   CO2 31  19 - 32 mEq/L   Glucose, Bld 90  70 - 99 mg/dL   BUN 12  6 - 23 mg/dL   Creatinine, Ser 1.61  0.50 - 1.10 mg/dL   Calcium 9.5  8.4 - 09.6 mg/dL   Total Protein 7.0  6.0 - 8.3 g/dL   Albumin 3.6  3.5 - 5.2 g/dL   AST 16  0 - 37 U/L   ALT 11  0 - 35 U/L   Alkaline Phosphatase 76  39 - 117 U/L   Total Bilirubin 0.3  0.3 - 1.2 mg/dL   GFR calc non Af Amer 78 (*) >90 mL/min   GFR calc Af Amer 90 (*) >90 mL/min  D-DIMER, QUANTITATIVE      Result Value Range   D-Dimer, Quant 2.97 (*) 0.00 - 0.48 ug/mL-FEU  TROPONIN I      Result Value Range   Troponin I <0.30  <0.30 ng/mL  PATHOLOGIST SMEAR REVIEW      Result Value Range   Path Review Reviewed by Tamsen Roers, M.D.     Laboratory interpretation all normal except elevated BNP, stable elevated d-dimer, stable leukocytosis     Imaging Review Dg Chest Portable 1 View  08/27/2013   CLINICAL DATA:  Cough, congestion  EXAM: PORTABLE CHEST - 1 VIEW  COMPARISON:  12/03/2012  FINDINGS: Cardiomediastinal silhouette is stable. Central mild vascular congestion without pulmonary edema. Bilateral small pleural effusion with bilateral basilar atelectasis or infiltrate.  IMPRESSION: Central vascular congestion without convincing pulmonary edema. Bilateral small pleural effusion with bilateral basilar atelectasis or infiltrate.   Electronically Signed   By: Natasha Mead M.D.   On: 08/27/2013 10:21     EKG Interpretation    Date/Time:  Monday August 27 2013 09:53:16 EST Ventricular Rate:  72 PR Interval:  166 QRS Duration: 94 QT Interval:  426 QTC Calculation: 466 R Axis:   90 Text Interpretation:  Sinus rhythm Borderline right axis deviation Consider left ventricular hypertrophy Since last tracing Repol abnrm suggests ischemia, lateral leads Confirmed by Edyn Qazi  MD-I, Shemeca Lukasik ([id number]) on 08/27/2013 10:21:40 AM            MDM   1. Dyspnea   2. CHF (congestive heart failure)   3. Bronchospasm   4. Leukocytosis     Plan admission  Devoria Albe, MD, Franz Dell, MD 08/27/13 [phone]

## 2013-08-27 NOTE — H&P (Signed)
[facility] History and Physical  Sara [name] ZOX:[id number] DOB: 1919-07-22 DOA: 08/27/2013   PCP: Kimber Relic, MD   Chief Complaint: Shortness of breath  HPI:   79 year old female with a history of atrial fibrillation, hypertension, CAD with MI, diastolic CHF, and cutaneous T-Cell Lymphoma, right AKA presents with several-day history of shortness of breath that was significantly worse this morning. The patient relates a history of increasing left lower extremity edema, orthopnea, and increasing abdominal girth. She states that she has had intermittent chest discomfort. She also complains of a cough with yellow sputum. There is no fevers, chills, hemoptysis, nausea, vomiting, diarrhea, dysuria, hematuria, abdominal pain. She lives at [facility].  She endorses compliance with all her medications. Most recently, the patient finished a five-day course of Avelox for bronchitis.  Normally, the patient does not need any supplemental oxygen. She was noted to have oxygen saturation of 88% on room air upon arrival to the ED.   In the ED, the patient was noted to have proBNP 2534, d-dimer 2.97, chest x-ray with central vascular congestion and bilateral pleural effusion, and EKG showing sinus rhythm with nonspecific ST changes. CBC showed WBC 32.8. BMP was unremarkable. Troponin was negative x1. She received 60 mg of IV furosemide as well as aerosolized albuterol and Atrovent with some improvement of her shortness of breath.  Assessment/Plan: Acute on chronic diastolic CHF  -Start furosemide 40 mg IV twice a day  -daily weights -Strict I.'s and O.'s  -Fluid restrict  -Echocardiogram  -Continue aerosolized albuterol and Atrovent as the patient still has a small amount of wheezing atypical chest discomfort -Cycle troponins Paroxysmal atrial fibrillation -Continue Pradaxa -Currently in sinus rhythm -Rate controlled Hypertension -Elevated BP in the ED partly due to the patient  not receiving her morning medications -Restart her atenolol, Cardizem CD, Imdur lower, and lisinopril Cutaneous T-cell lymphoma -Continue bexarotene -follows Dr. Osborne [facility] pt on his list Elevated d-dimer  -Patient is already taking Pradaxa  -Clinical suspicion of pulmonary embolus is low given the patient's clinical presentation  Leukocytosis -Patient has chronic leukocytosis likely due to cutaneous T-cell lymphoma -Blood cultures x2 sets -Urinalysis with reflex 2 urine culture      Past Medical History  Diagnosis Date  . Hypertension   . Atrial fibrillation   . Psoriasis   . Congestive heart failure (CHF)   . History of CVA (cerebrovascular accident)   . Coronary artery disease   . Myocardial infarction   . CHF (congestive heart failure)   . Shortness of breath   . H/O hiatal hernia   . Closed fracture of head of left humerus 5/12  . Basal cell carcinoma   . 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  Flow cytometry 04/04/12: 91% cells CD4 positive CD 26 negative  . Hyperlipemia   . Pneumonia   . Wound of right leg 09/25/2012    Necrotic, open wound right lower leg; non healing  . Stroke   . Hyposmolality and/or hyponatremia   . Lower limb amputation, above knee 10/11/2012  . Pain in joint, multiple sites   . Mycosis fungoides, unspecified site, extranodal and solid organ sites 09/28/2012  . Occlusion and stenosis of carotid artery without mention of cerebral infarction 09/28/2012  . Chronic venous hypertension with ulcer and inflammation 09/28/2012  . Diverticulosis of colon (without mention of hemorrhage)  09/28/2012  . Synovial cyst of popliteal space 03/26/2012  . Unspecified constipation 03/24/2012  . Hypopotassemia 01/26/2012  . Debility, unspecified 01/17/2012  . Anxiety state, unspecified 12/31/2011  . Anemia,  unspecified 12/21/2011  . Other specified disease of white blood cells 12/21/2011  . Peripheral vascular disease, unspecified 12/21/2011    recent ABI Lt of 0.64 down from ).80 on 12/29/11- though different labs  . Reflux esophagitis 12/21/2011  . Unspecified hereditary and idiopathic peripheral neuropathy 12/14/2011  . Pain in joint, ankle and foot 12/14/2011  . Carotid stenosis     CAROTID DOPPLER, 05/02/2012 - LEFT VERTEBRAL-occluded, LEFT BULB AND PROXIMAL ICA STENT-moderate amount of irregular mixed dense plaque 50-69% diameter reduction  . Pre-syncope     NUCLEAR STRESS TEST, 12/10/2009 - normal, EKG negative for ischemia  . TIA (transient ischemic attack)     2D ECHO, 12/05/2012 - EF 60-65%, Severely calcified annulus of the mitral valve with mild-moderate regurgitation, LA moderate-severely dilated  . Paroxysmal atrial fibrillation   . CAD in native artery     3 vessel CAD, medical therapy, not a candidate for CABG  . S/P AKA (above knee amputation) unilateral, Rt leg due to PAD and non healing wound 06/27/2013  . HCAP (healthcare-associated pneumonia) 01/08/2012   Past Surgical History  Procedure Laterality Date  . Tonsillectomy    . Dilation and curettage of uterus    . Eye surgery    . Cataracts    . Vertebroplasty    . Amputation  10/10/2012    Procedure: AMPUTATION ABOVE KNEE;  Surgeon: Nadara Mustard, MD;  Location: [facility] OR;  Service: Orthopedics;  Laterality: Right;  . Percutaneous stent intervention  04/13/2011    Left internal carotid artery stented with a 8x30 prcise nitinol self-expanding stent resulting reduction of 90% to less than 20% residual  . Cardiac catheterization  11/06/2010    CABG recommended, turned down by [facility] secondary to co morbidities   Social History:  reports that she has never smoked. She has never used smokeless tobacco. She reports that she does not drink alcohol or use illicit drugs.   Family History  Problem Relation Age of Onset  . Stomach cancer  Mother   . Pneumonia Father   . Cirrhosis Brother   . Diabetes Brother   . Heart attack Paternal Grandmother   . Heart failure Sister   . Diabetes Sister   . Heart failure Sister      Allergies  Allergen Reactions  . Evista [Raloxifene]   . Penicillins Other (See Comments)    unknown  . Triamterene Other (See Comments)    unknown  . Alendronate Sodium Rash    Pt stated oh k to use biotene      Prior to Admission medications   Medication Sig Start Date End Date Taking? Authorizing Provider  atorvastatin (LIPITOR) 40 MG tablet Take 1 tablet (40 mg total) by mouth daily at 6 PM. 12/05/12  Yes Rhetta Mura, MD  bexarotene (TARGRETIN) 75 MG CAPS capsule Take 150 mg by mouth daily before supper. Give with food. Protect from light. CAUTION: Chemotherapy/Biotherapy-Wear gloves when handling 09/25/12  Yes Historical Provider, MD  carvedilol (COREG) 25 MG tablet Take 25 mg by mouth 2 (two) times daily with a meal.  12/05/12  Yes Rhetta Mura, MD  dabigatran (PRADAXA) 150 MG CAPS Take 75 mg by mouth every 12 (twelve) hours.  12/05/12  Yes Rhetta Mura, MD  diltiazem (CARDIZEM CD) 120 MG 24 hr capsule Take 120  mg by mouth daily. Take 1 tablet once daily for BP.   Yes Historical Provider, MD  famotidine (PEPCID) 20 MG tablet Take 40 mg by mouth daily.   Yes Historical Provider, MD  folic acid (FOLVITE) 1 MG tablet Take 1 mg by mouth daily.   Yes Historical Provider, MD  furosemide (LASIX) 40 MG tablet Take 1 tablet (40 mg total) by mouth 2 (two) times daily. 10/20/12  Yes Belkys A Regalado, MD  gabapentin (NEURONTIN) 100 MG capsule Take 1 capsule (100 mg total) by mouth at bedtime. 12/05/12  Yes Rhetta Mura, MD  guaiFENesin (MUCINEX) 600 MG 12 hr tablet Take by mouth 2 (two) times daily.   Yes Historical Provider, MD  HYDROcodone-acetaminophen (NORCO/VICODIN) 5-325 MG per tablet Take 1 tablet by mouth every 6 (six) hours as needed for moderate pain.   Yes Historical  Provider, MD  ipratropium-albuterol (DUONEB) 0.5-2.5 (3) MG/3ML SOLN Take 3 mLs by nebulization.   Yes Historical Provider, MD  isosorbide mononitrate (IMDUR) 30 MG 24 hr tablet Take 1 tablet (30 mg total) by mouth daily. 06/27/13  Yes Nada Boozer, NP  levocetirizine (XYZAL) 5 MG tablet Take 5 mg by mouth as needed for allergies.   Yes Historical Provider, MD  lisinopril (PRINIVIL,ZESTRIL) 10 MG tablet Take 10 mg by mouth daily.   Yes Historical Provider, MD  moxifloxacin (AVELOX) 400 MG tablet Take 400 mg by mouth daily at 8 pm.   Yes Historical Provider, MD  Omega-3 Fatty Acids (FISH OIL) 1000 MG CAPS Take 1,000 mg by mouth at bedtime.    Yes Historical Provider, MD  polyethylene glycol (MIRALAX / GLYCOLAX) packet Take 17 g by mouth every other day. Take 1 packet mixed in 4 oz of beverage every day to relieve constipation.   Yes Historical Provider, MD  Polyvinyl Alcohol-Povidone (REFRESH OP) Apply 1 drop to eye 4 (four) times daily. Instill 1 drop to both eyes four times daily.*Wait 3-5 minutes between eye drops*   Yes Historical Provider, MD  saccharomyces boulardii (FLORASTOR) 250 MG capsule Take 250 mg by mouth 2 (two) times daily.   Yes Historical Provider, MD  vitamin C (ASCORBIC ACID) 500 MG tablet Take 500 mg by mouth daily.   Yes Historical Provider, MD  nitroGLYCERIN (NITROSTAT) 0.4 MG SL tablet Place 0.4 mg under the tongue every 5 (five) minutes as needed for chest pain. If no relief call MD.    Historical Provider, MD    Review of Systems:  Constitutional:  No weight loss, night sweats, Fevers, chills, fatigue.  Head&Eyes: No headache.  No vision loss.  No eye pain or scotoma ENT:  No Difficulty swallowing,Tooth/dental problems,Sore throat,  No ear ache, post nasal drip,  Cardio-vascular:  No  dizziness, palpitations  GI:  No  abdominal pain, nausea, vomiting, diarrhea, loss of appetite, hematochezia, melena, heartburn, indigestion, Resp:  No coughing up of blood .No chest  wall deformity . Complains of shortness of breath and intermittent wheezing Skin:  no rash or lesions.  GU:  no dysuria, change in color of urine, no urgency or frequency. No flank pain.  Musculoskeletal:  No joint pain or swelling. No decreased range of motion. No back pain.  Psych:  No change in mood or affect. No depression or anxiety. Neurologic: No headache, no dysesthesia, no focal weakness, no vision loss. No syncope  Physical Exam: Filed Vitals:   08/27/13 1023 08/27/13 1220 08/27/13 1703 08/27/13 1722  BP:  192/97 160/68 189/86  Pulse:  70 68  76  Temp:   98.6 F (37 C) 97.6 F (36.4 C)  TempSrc:   Oral Oral  Resp:  19 11 15   SpO2: 97% 96% 96% 96%   General:  A&O x 3, NAD, nontoxic, pleasant/cooperative Head/Eye: No conjunctival hemorrhage, no icterus, [place]/AT, No nystagmus ENT:  No icterus,  No thrush, good dentition, no pharyngeal exudate Neck:  No masses, no lymphadenpathy, no bruits CV:  RRR, no rub, no gallop, no S3 Lung:  Bibasilar crackles. No wheezing. Good air movement Abdomen: soft/NT, +BS, nondistended, no peritoneal signs Ext: R-AKA;  1+ R-thigh edema and L-leg edemaNo cyanosis, No rashes, No petechiae, No lymphangitis,    Labs on Admission:  Basic Metabolic Panel:  Recent Labs Lab 08/27/13 1150  NA 141  K 3.6  CL 99  CO2 31  GLUCOSE 90  BUN 12  CREATININE 0.58  CALCIUM 9.5   Liver Function Tests:  Recent Labs Lab 08/27/13 1150  AST 16  ALT 11  ALKPHOS 76  BILITOT 0.3  PROT 7.0  ALBUMIN 3.6   No results found for this basename: LIPASE, AMYLASE,  in the last 168 hours No results found for this basename: AMMONIA,  in the last 168 hours CBC:  Recent Labs Lab 08/27/13 1015  WBC 32.8*  NEUTROABS 10.2*  HGB 14.2  HCT 42.0  MCV 92.9  PLT 287   Cardiac Enzymes:  Recent Labs Lab 08/27/13 1150  TROPONINI <0.30   BNP: No components found with this basename: POCBNP,  CBG: No results found for this basename: GLUCAP,  in the  last 168 hours  Radiological Exams on Admission: Dg Chest Portable 1 View  08/27/2013   CLINICAL DATA:  Cough, congestion  EXAM: PORTABLE CHEST - 1 VIEW  COMPARISON:  12/03/2012  FINDINGS: Cardiomediastinal silhouette is stable. Central mild vascular congestion without pulmonary edema. Bilateral small pleural effusion with bilateral basilar atelectasis or infiltrate.  IMPRESSION: Central vascular congestion without convincing pulmonary edema. Bilateral small pleural effusion with bilateral basilar atelectasis or infiltrate.   Electronically Signed   By: Natasha Mead M.D.   On: 08/27/2013 10:21    EKG: Independently reviewed. Sinus rhythm, nonspecific ST-T wave changes    Time spent:60 minutes Code Status:   DNR Family Communication:   No Family at bedside   Absalom Aro, DO  [facility] Pager [phone]  If 7PM-7AM, please contact night-coverage [email or web address] Password [email or web address] 08/27/2013, 5:25 PM

## 2013-08-27 NOTE — ED Notes (Signed)
Bed: HQ46 Expected date: 08/27/13 Expected time: 9:30 AM Means of arrival:  Comments: EMS 39 female SOB

## 2013-08-27 NOTE — Progress Notes (Signed)
Attempted to call and get report from ED and was on hold for 10 minutes, will try back. Sara [name]

## 2013-08-27 NOTE — ED Notes (Addendum)
Per EMS pt from [facility] c/o SOB for 2 weeks and worse today. Been treated in last 2 weeks for acute bronchitis and was given antibiotics. Pt has completed antibiotics. IV left hand 22 guage. EMS gave dual neb in route. Pt

## 2013-08-28 DIAGNOSIS — I059 Rheumatic mitral valve disease, unspecified: Secondary | ICD-10-CM

## 2013-08-28 DIAGNOSIS — I10 Essential (primary) hypertension: Secondary | ICD-10-CM

## 2013-08-28 LAB — BASIC METABOLIC PANEL
BUN: 17 mg/dL (ref 6–23)
CO2: 31 mEq/L (ref 19–32)
Calcium: 9.5 mg/dL (ref 8.4–10.5)
Creatinine, Ser: 0.72 mg/dL (ref 0.50–1.10)
GFR calc Af Amer: 84 mL/min — ABNORMAL LOW (ref >90)
GFR calc non Af Amer: 72 mL/min — ABNORMAL LOW (ref >90)
Glucose, Bld: 97 mg/dL (ref 70–99)
Potassium: 3.7 mEq/L (ref 3.5–5.1)

## 2013-08-28 LAB — CBC WITH DIFFERENTIAL/PLATELET
Basophils Absolute: 0 10*3/uL (ref 0.0–0.1)
Basophils Relative: 0 % (ref 0–1)
Eosinophils Absolute: 0.6 10*3/uL (ref 0.0–0.7)
HCT: 35.5 % — ABNORMAL LOW (ref 36.0–46.0)
Hemoglobin: 11.5 g/dL — ABNORMAL LOW (ref 12.0–15.0)
Lymphocytes Relative: 74 % — ABNORMAL HIGH (ref 12–46)
MCH: 30.4 pg (ref 26.0–34.0)
Monocytes Relative: 3 % (ref 3–12)
Neutro Abs: 6.3 10*3/uL (ref 1.7–7.7)
Neutrophils Relative %: 21 % — ABNORMAL LOW (ref 43–77)
WBC: 29.8 10*3/uL — ABNORMAL HIGH (ref 4.0–10.5)

## 2013-08-28 MED ORDER — ALBUTEROL SULFATE (5 MG/ML) 0.5% IN NEBU
2.5000 mg | INHALATION_SOLUTION | Freq: Three times a day (TID) | RESPIRATORY_TRACT | Status: DC
  Administered 2013-08-28 – 2013-08-30 (×7): 2.5 mg via RESPIRATORY_TRACT
  Filled 2013-08-28 (×7): qty 0.5

## 2013-08-28 MED ORDER — IPRATROPIUM BROMIDE 0.02 % IN SOLN
0.5000 mg | Freq: Three times a day (TID) | RESPIRATORY_TRACT | Status: DC
  Administered 2013-08-28 – 2013-08-30 (×7): 0.5 mg via RESPIRATORY_TRACT
  Filled 2013-08-28 (×7): qty 2.5

## 2013-08-28 MED ORDER — ALBUTEROL SULFATE (5 MG/ML) 0.5% IN NEBU: 5.0000 mg | INHALATION_SOLUTION | Freq: Four times a day (QID) | RESPIRATORY_TRACT | Status: DC | PRN

## 2013-08-28 NOTE — Progress Notes (Signed)
Clinical Social Work Department BRIEF PSYCHOSOCIAL ASSESSMENT 08/28/2013  Patient:  Sara [name],Sara [name]     Account Number:  [id number]     Admit date:  08/27/2013  Clinical Social Worker:  Orpah Greek  Date/Time:  08/28/2013 01:40 PM  Referred by:  Physician  Date Referred:  08/28/2013 Referred for  Other - See comment   Other Referral:   Admitted from: [facility] SNF (though has an apartment in their Independent Living still that she hopes to return to)   Interview type:  Patient Other interview type:   and daughter, Sara [name] at bedside    PSYCHOSOCIAL DATA Living Status:  FACILITY Admitted from facility:  [facility] Level of care:  Skilled Nursing Facility Primary support name:  Sara [name] (daughter) ph#: [phone] Primary support relationship to patient:  CHILD, ADULT Degree of support available:   good    CURRENT CONCERNS Current Concerns  Post-Acute Placement   Other Concerns:    SOCIAL WORK ASSESSMENT / PLAN CSW received referral that patient was admitted from [facility]. CSW confirmed with Lilly [place] @ [facility] that patient has an apartment in their Independent Living but has been in the skilled nursing side for almost [name] year.   Assessment/plan status:  Information/Referral to [facility] Other assessment/ plan:   Information/referral to community resources:   CSW completed FL2 and faxed information to [facility], confirmed with Nedra Hai that they would be able to take patient back to their SNF when ready.    PATIENT'S/FAMILY'S RESPONSE TO PLAN OF CARE: Patient stated to CSW that she does not feel that she has been receiving "adequate therapy" at [facility], that they do not have [name] therapist there that has experience with amputees. Patient's daughter asked about [facility], CSW explained that it would require 3 hours of pretty intense rehab [name] day and patient is agreeable. Patient desperately would like  to return to her Independent Living apartment and feels that an intense rehab like that would teach her things such as how to transfer from [name] bed to wheelchair (which she was not taught in the SNF at [facility]) which would greatly help her become less dependent on the nurses at [facility] and would get her back to her apartment quicker.    CSW text paged Dr. Suanne Marker requesting CIR consult be ordered. Genie @ CIR made aware.         Unice Bailey, LCSW [facility] Clinical Social Worker cell #: [phone]

## 2013-08-28 NOTE — Progress Notes (Signed)
Echo Lab  2D Echocardiogram completed.  Sara [name] L Nyree Applegate, RDCS 08/28/2013 12:29 PM   

## 2013-08-28 NOTE — Progress Notes (Addendum)
[facility] PROGRESS NOTE  CATALEA LABRECQUE ZOX:[id number] DOB: [birth year] DOA: 08/27/2013 PCP: Kimber Relic, MD  Assessment/Plan: Acute on chronic diastolic CHF  continue furosemide 40 mg IV twice a day  -daily weights ,Strict I.'s and O.'s, Fluid restriction -Await Echocardiogram  -continue coreg, lisinopril atypical chest discomfort  -Cardiac enzymes negative x3, await echo  Paroxysmal atrial fibrillation  -Continue Pradaxa  -Currently in sinus rhythm  -Rate controlled  Hypertension  -cortinue coreg, Cardizem CD, Imdur, and lisinopril  Cutaneous T-cell lymphoma  -bexarotene not available in hosp pharmacy, but pharmacist to atrempt to obtain her supply from SNF. -followed by Dr. Vena Austria pt on his list  Elevated d-dimer  -Patient is already taking Pradaxa, CEs neg. Leukocytosis  -Patient has chronic leukocytosis likely due to cutaneous T-cell lymphoma -Urinalysis neg for infection and CXR not convincing for infection -Follow up on pending Blood and urinecultures Deconditioning -CIR consult, follow      Code Status: full Family Communication: Daughter at bedside Disposition Plan: To rehabilitation   Consultants:  none  Procedures:  Echo - pending  Antibiotics:  none  HPI/Subjective: Still some shortness of breath but states better, denies chest pain.  Objective: Filed Vitals:   08/28/13 1306  BP: 141/68  Pulse: 66  Temp: 98.3 F (36.8 C)  Resp: 20    Intake/Output Summary (Last 24 hours) at 08/28/13 1841 Last data filed at 08/28/13 1310  Gross per 24 hour  Intake    600 ml  Output   1225 ml  Net   -625 ml   Filed Weights   08/27/13 1722 08/28/13 0611  Weight: 62 kg (136 lb 11 oz) 61.7 kg (136 lb 0.4 oz)    Exam:  General: alert & oriented x 3 In NAD Cardiovascular: RRR, nl S1 s2 Respiratory: Basilar crackles, no wheezes. Abdomen: soft +BS NT/ND, no masses palpable Extremities: No cyanosis and no edema, erythematous  hands/arms(long-standing with her T-cell lymphoma per patient)     Data Reviewed: Basic Metabolic Panel:  Recent Labs Lab 08/27/13 1150 08/27/13 1835 08/28/13 0445  NA 141  --  137  K 3.6  --  3.7  CL 99  --  97  CO2 31  --  31  GLUCOSE 90  --  97  BUN 12  --  17  CREATININE 0.58  --  0.72  CALCIUM 9.5  --  9.5  MG  --  2.0  --    Liver Function Tests:  Recent Labs Lab 08/27/13 1150  AST 16  ALT 11  ALKPHOS 76  BILITOT 0.3  PROT 7.0  ALBUMIN 3.6   No results found for this basename: LIPASE, AMYLASE,  in the last 168 hours No results found for this basename: AMMONIA,  in the last 168 hours CBC:  Recent Labs Lab 08/27/13 1015 08/28/13 0445  WBC 32.8* 29.8*  NEUTROABS 10.2* 6.3  HGB 14.2 11.5*  HCT 42.0 35.5*  MCV 92.9 93.9  PLT 287 266   Cardiac Enzymes:  Recent Labs Lab 08/27/13 1150 08/27/13 1835 08/27/13 2301  TROPONINI <0.30 <0.30 <0.30   BNP (last 3 results)  Recent Labs  10/05/12 1545 10/18/12 2010 08/27/13 1015  PROBNP 3200.0* 4481.0* 2534.0*   CBG: No results found for this basename: GLUCAP,  in the last 168 hours  Recent Results (from the past 240 hour(s))  MRSA PCR SCREENING     Status: Abnormal   Collection Time    08/27/13  6:41 PM  Result Value Range Status   MRSA by PCR POSITIVE (*) NEGATIVE Final   Comment:            The GeneXpert MRSA Assay (FDA     approved for NASAL specimens     only), is one component of a     comprehensive MRSA colonization     surveillance program. It is not     intended to diagnose MRSA     infection nor to guide or     monitor treatment for     MRSA infections.     RESULT CALLED TO, READ BACK BY AND VERIFIED WITH:     MORRIS,M/4W @2135  ON 08/27/13 BY KARCZEWSKI,S.     Studies: Dg Chest Portable 1 View  08/27/2013   CLINICAL DATA:  Cough, congestion  EXAM: PORTABLE CHEST - 1 VIEW  COMPARISON:  12/03/2012  FINDINGS: Cardiomediastinal silhouette is stable. Central mild vascular  congestion without pulmonary edema. Bilateral small pleural effusion with bilateral basilar atelectasis or infiltrate.  IMPRESSION: Central vascular congestion without convincing pulmonary edema. Bilateral small pleural effusion with bilateral basilar atelectasis or infiltrate.   Electronically Signed   By: Natasha Mead M.D.   On: 08/27/2013 10:21    Scheduled Meds: . ipratropium  0.5 mg Nebulization TID   And  . albuterol  2.5 mg Nebulization TID  . atorvastatin  40 mg Oral q1800  . bexarotene  150 mg Oral QAC supper  . carvedilol  25 mg Oral BID WC  . Chlorhexidine Gluconate Cloth  6 each Topical Q0600  . dabigatran  75 mg Oral Q12H  . diltiazem  120 mg Oral Daily  . famotidine  40 mg Oral Daily  . folic acid  1 mg Oral Daily  . furosemide  40 mg Intravenous BID  . gabapentin  100 mg Oral QHS  . isosorbide mononitrate  30 mg Oral Daily  . lisinopril  10 mg Oral Daily  . mupirocin ointment  1 application Nasal BID  . omega-3 acid ethyl esters  1 g Oral Daily  . polyethylene glycol  17 g Oral QODAY  . polyvinyl alcohol  1 drop Both Eyes TID  . sodium chloride  3 mL Intravenous Q12H  . 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 If 7PM-7AM, please contact night-coverage at [email or web address], password [facility] 08/28/2013, 6:41 PM  LOS: 1 day

## 2013-08-28 NOTE — Progress Notes (Signed)
Spoke with pt and daughter Sara [name] at bedside concerning disposition, they would like CIR or return back to SNF. Daughter states pt need help with new right AKA/transferring/walking.

## 2013-08-29 LAB — BASIC METABOLIC PANEL
BUN: 23 mg/dL (ref 6–23)
CO2: 29 mEq/L (ref 19–32)
Chloride: 98 mEq/L (ref 96–112)
GFR calc Af Amer: 81 mL/min — ABNORMAL LOW (ref >90)
Potassium: 3.7 mEq/L (ref 3.5–5.1)
Sodium: 139 mEq/L (ref 135–145)

## 2013-08-29 MED ORDER — CARVEDILOL 12.5 MG PO TABS
12.5000 mg | ORAL_TABLET | Freq: Two times a day (BID) | ORAL | Status: DC
  Administered 2013-08-30: 12.5 mg via ORAL
  Filled 2013-08-29 (×3): qty 1

## 2013-08-29 NOTE — Progress Notes (Signed)
I agree with the above.  Ranelle Oyster, MD, [facility] Physical Medicine & Rehabilitation

## 2013-08-29 NOTE — Progress Notes (Signed)
[facility] PROGRESS NOTE  Sara [name] ZOX:[id number] DOB: 12/06/1918 DOA: 08/27/2013 PCP: Kimber Relic, MD  Assessment/Plan  Acute on chronic diastolic CHF still SOB  - continue furosemide 40 mg IV twice a day  - daily weights ,Strict I.'s and O.'s, Fluid restriction  -  ECHO:  Grade 2 DD -continue coreg, lisinopril   atypical chest discomfort  -Cardiac enzymes negative x3, no focal wall motion abnl on ECHO   Paroxysmal atrial fibrillation, sinus brady on telemetry, but asymptomatic -Continue Pradaxa  -Currently in sinus rhythm  - Reduce carvedilol  Hypertension with mildly low BP,  -  reduce coreg -  Consider stopping cardizem CD -  D/c Imdur -  Hold parameter for lisinopril   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  -followed by Dr. Vena Austria pt on his list   Elevated d-dimer, Patient is already taking Pradaxa, CEs neg.   Leukocytosis, at baseline due to cutaneous T-cell lymphoma  -Urinalysis neg for infection and CXR not convincing for infection  - blood cx NGTD  Deconditioning CIR declined.  Will likely return to SNF for ongoing rehab. -  PT/OT, OOB -CIR consult, follow  Diet:  2gm, 1.2L  Access:  PIV IVF:  OFF Proph:  lovenox  Code Status: DNR Family Communication: patient ,her daughter Disposition Plan: Pending improvement in breathing and diuresis, likely in a couple of days   Consultants:  None  Procedures:  Echo  Antibiotics:  None   HPI/Subjective:  Patient states that she still feels somewhat Lyndy Russman of breath, denies chest pain. She has not been out of bed.    Objective: Filed Vitals:   08/29/13 0606 08/29/13 0649 08/29/13 1400 08/29/13 1413  BP: 134/42   125/44  Pulse: 62   62  Temp: 98.5 F (36.9 C)   98.1 F (36.7 C)  TempSrc: Oral   Oral  Resp: 20   20  Height:      Weight:  61.7 kg (136 lb  0.4 oz)    SpO2: 95%  96% 99%    Intake/Output Summary (Last 24 hours) at 08/29/13 1858 Last data filed at 08/29/13 1838  Gross per 24 hour  Intake   1080 ml  Output   1300 ml  Net   -220 ml   Filed Weights   08/27/13 1722 08/28/13 0611 08/29/13 0649  Weight: 62 kg (136 lb 11 oz) 61.7 kg (136 lb 0.4 oz) 61.7 kg (136 lb 0.4 oz)    Exam:   General:  Caucasian female, No acute distress  HEENT:  NCAT, MMM  Cardiovascular:  RRR, nl S1, S2 no mrg, 2+ pulses, warm extremities  Respiratory:  Rales at the bilateral bases, no focal rhonchi or wheeze,, no increased WOB  Abdomen:   NABS, soft, NT/ND  MSK:   Decreased tone and bulk, no LEE  Neuro:  Grossly intact  Skin: Very erythematous back, arms, hands  Data Reviewed: Basic Metabolic Panel:  Recent Labs Lab 08/27/13 1150 08/27/13 1835 08/28/13 0445 08/29/13 0456  NA 141  --  137 139  K 3.6  --  3.7 3.7  CL 99  --  97 98  CO2 31  --  31 29  GLUCOSE 90  --  97 97  BUN 12  --  17 23  CREATININE 0.58  --  0.72 0.79  CALCIUM 9.5  --  9.5 9.5  MG  --  2.0  --   --    Liver Function Tests:  Recent Labs Lab 08/27/13 1150  AST 16  ALT 11  ALKPHOS 76  BILITOT 0.3  PROT 7.0  ALBUMIN 3.6   No results found for this basename: LIPASE, AMYLASE,  in the last 168 hours No results found for this basename: AMMONIA,  in the last 168 hours CBC:  Recent Labs Lab 08/27/13 1015 08/28/13 0445  WBC 32.8* 29.8*  NEUTROABS 10.2* 6.3  HGB 14.2 11.5*  HCT 42.0 35.5*  MCV 92.9 93.9  PLT 287 266   Cardiac Enzymes:  Recent Labs Lab 08/27/13 1150 08/27/13 1835 08/27/13 2301  TROPONINI <0.30 <0.30 <0.30   BNP (last 3 results)  Recent Labs  10/05/12 1545 10/18/12 2010 08/27/13 1015  PROBNP 3200.0* 4481.0* 2534.0*   CBG: No results found for this basename: GLUCAP,  in the last 168 hours  Recent Results (from the past 240 hour(s))  CULTURE, BLOOD (ROUTINE X 2)     Status: None   Collection Time    08/27/13   6:35 PM      Result Value Range Status   Specimen Description BLOOD RIGHT ARM   Final   Special Requests BOTTLES DRAWN AEROBIC AND ANAEROBIC 10CC   Final   Culture  Setup Time     Final   Value: 08/28/2013 00:18     Performed at [facility]   Culture     Final   Value:        BLOOD CULTURE RECEIVED NO GROWTH TO DATE CULTURE WILL BE HELD FOR 5 DAYS BEFORE ISSUING A FINAL NEGATIVE REPORT     Performed at [facility]   Report Status PENDING   Incomplete  CULTURE, BLOOD (ROUTINE X 2)     Status: None   Collection Time    08/27/13  6:40 PM      Result Value Range Status   Specimen Description BLOOD RIGHT HAND   Final   Special Requests BOTTLES DRAWN AEROBIC AND ANAEROBIC 10CC   Final   Culture  Setup Time     Final   Value: 08/28/2013 00:18     Performed at [facility]   Culture     Final   Value:        BLOOD CULTURE RECEIVED NO GROWTH TO DATE CULTURE WILL BE HELD FOR 5 DAYS BEFORE ISSUING A FINAL NEGATIVE REPORT     Performed at [facility]   Report Status PENDING   Incomplete  MRSA PCR SCREENING     Status: Abnormal   Collection Time    08/27/13  6:41 PM      Result Value Range Status   MRSA by PCR POSITIVE (*) NEGATIVE Final   Comment:            The GeneXpert MRSA Assay (FDA     approved for NASAL specimens     only), is one component of a     comprehensive MRSA colonization     surveillance program. It is not     intended to diagnose MRSA     infection nor to guide or     monitor treatment for     MRSA infections.     RESULT CALLED TO, READ BACK BY AND VERIFIED WITH:     MORRIS,M/4W @2135  ON 08/27/13 BY KARCZEWSKI,S.     Studies: No results found.  Scheduled Meds: . ipratropium  0.5 mg Nebulization TID   And  .  albuterol  2.5 mg Nebulization TID  . atorvastatin  40 mg Oral q1800  . bexarotene  150 mg Oral QAC supper  . carvedilol  25 mg Oral BID WC  . Chlorhexidine Gluconate Cloth  6 each Topical Q0600  . dabigatran  75 mg  Oral Q12H  . diltiazem  120 mg Oral Daily  . famotidine  40 mg Oral Daily  . folic acid  1 mg Oral Daily  . furosemide  40 mg Intravenous BID  . gabapentin  100 mg Oral QHS  . isosorbide mononitrate  30 mg Oral Daily  . lisinopril  10 mg Oral Daily  . mupirocin ointment  1 application Nasal BID  . omega-3 acid ethyl esters  1 g Oral Daily  . polyethylene glycol  17 g Oral QODAY  . polyvinyl alcohol  1 drop Both Eyes TID  . sodium chloride  3 mL Intravenous Q12H  . 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 If 7PM-7AM, please contact night-coverage at [email or web address], password [facility] 08/29/2013, 6:58 PM  LOS: 2 days

## 2013-08-29 NOTE — Progress Notes (Signed)
Chart reviewed and note that patient has been at SNF for the past year. She is 79 years old with R-AKA 09/2012.  Doubt that she will get to independent level after short rehab stay and would recommend continued PT/OT at [facility] to decide if she is safe to [place].

## 2013-08-29 NOTE — Progress Notes (Signed)
Patient had an 11 beat run of Vtach. Patient asymptomatic. Doctor was made aware. Will continue to monitor.

## 2013-08-30 ENCOUNTER — Inpatient Hospital Stay (HOSPITAL_COMMUNITY): Payer: Medicare Other

## 2013-08-30 ENCOUNTER — Ambulatory Visit: Payer: Medicare Other | Admitting: Cardiovascular Disease

## 2013-08-30 DIAGNOSIS — D649 Anemia, unspecified: Secondary | ICD-10-CM

## 2013-08-30 LAB — PRO B NATRIURETIC PEPTIDE: Pro B Natriuretic peptide (BNP): 802.3 pg/mL — ABNORMAL HIGH (ref 0–450)

## 2013-08-30 LAB — BASIC METABOLIC PANEL
CO2: 29 mEq/L (ref 19–32)
Calcium: 9.4 mg/dL (ref 8.4–10.5)
Chloride: 94 mEq/L — ABNORMAL LOW (ref 96–112)
Creatinine, Ser: 0.85 mg/dL (ref 0.50–1.10)
Glucose, Bld: 103 mg/dL — ABNORMAL HIGH (ref 70–99)
Sodium: 134 mEq/L — ABNORMAL LOW (ref 135–145)

## 2013-08-30 MED ORDER — FUROSEMIDE 40 MG PO TABS
40.0000 mg | ORAL_TABLET | Freq: Two times a day (BID) | ORAL | Status: DC
  Filled 2013-08-30 (×2): qty 1

## 2013-08-30 MED ORDER — FUROSEMIDE 10 MG/ML IJ SOLN: 80.0000 mg | Freq: Two times a day (BID) | INTRAMUSCULAR | Status: DC

## 2013-08-30 MED ORDER — ISOSORBIDE MONONITRATE ER 30 MG PO TB24
30.0000 mg | ORAL_TABLET | Freq: Every day | ORAL | Status: DC
  Filled 2013-08-30: qty 1

## 2013-08-30 MED ORDER — HYDROCODONE-ACETAMINOPHEN 5-325 MG PO TABS: 1.0000 | ORAL_TABLET | Freq: Four times a day (QID) | ORAL | Status: DC | PRN

## 2013-08-30 MED ORDER — FUROSEMIDE 40 MG PO TABS: 60.0000 mg | ORAL_TABLET | Freq: Two times a day (BID) | ORAL | Status: DC

## 2013-08-30 MED ORDER — CARVEDILOL 12.5 MG PO TABS: 12.5000 mg | ORAL_TABLET | Freq: Two times a day (BID) | ORAL | Status: AC

## 2013-08-30 NOTE — Discharge Summary (Signed)
Physician Discharge Summary  Sara [name]:[id number] DOB: [birth year] DOA: 08/27/2013  PCP: Kimber Relic, MD  Admit date: 08/27/2013 Discharge date: 08/30/2013  Recommendations for Outpatient Follow-up:  1. Transfer to skilled nursing facility for ongoing care including physical therapy 2. Daily weights.  Increased lasix to 60mg  bid and decreased carvedilol slightly due to bradycardia 3. Cardiology on Dec 24th at 2:20pm with [facility] 4. Followup on final report of blood cultures 5. Dr. Cyndie Chime at routine follow up 6. Repeat BMP and CBC in 1-2 weeks to trend WBC and anemia, potassium and creatinine after increasing lasix.  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   HTN (hypertension)   PAF (paroxysmal atrial fibrillation)   DNR (do not resuscitate)   Bronchospasm, acute   Acute CHF   Acute on chronic diastolic CHF (congestive heart failure)   Leukocytosis, unspecified   Discharge Condition: stable, improved  Diet recommendation: low sodium  Wt Readings from Last 3 Encounters:  08/30/13 62.9 kg (138 lb 10.7 oz)  08/16/13 61.372 kg (135 lb 4.8 oz)  08/02/13 61.236 kg (135 lb)    History of present illness:  79 year old female with a history of atrial fibrillation, hypertension, CAD with MI, diastolic CHF, and cutaneous T-Cell Lymphoma, right AKA presents with several-day history of shortness of breath that was significantly worse this morning. The patient relates a history of increasing left lower extremity edema, orthopnea, and increasing abdominal girth. She states that she has had intermittent chest discomfort. She also complains of a cough with yellow sputum. There is no fevers, chills, hemoptysis, nausea, vomiting, diarrhea, dysuria, hematuria, abdominal pain. She lives at [facility]. She endorses compliance with all her medications. Most recently, the patient finished a five-day course of Avelox  for bronchitis. Normally, the patient does not need any supplemental oxygen. She was noted to have oxygen saturation of 88% on room air upon arrival to the ED.   In the ED, the patient was noted to have proBNP 2534, d-dimer 2.97, chest x-ray with central vascular congestion and bilateral pleural effusion, and EKG showing sinus rhythm with nonspecific ST changes. CBC showed WBC 32.8. BMP was unremarkable. Troponin was negative x1. She received 60 mg of IV furosemide as well as aerosolized albuterol and Atrovent with some improvement of her shortness of breath.   Hospital Course:   Acute hypoxic respiratory failure secondary to acute on chronic diastolic heart failure.  The patient was placed on telemetry and was given Lasix 40 mg IV twice a day.  She diureses approximately 2 L and her weight remained approximately stable. Symptomatically, however, she had less shortness of breath and her hypoxic respiratory failure resolved. Her chest x-ray showed improvement in her pulmonary edema and effusions. Her proBNP trended down from 2534 to 802.  Her weight at the time of discharge is 62.9 kg which is 138 pounds. She should have daily weights done at the skilled nursing facility, and if she gains more than 3 pounds in one day or 5 pounds in one week recommend that she call her cardiologist.  I have increased her lasix to 60mg  BID from 40mg  BID and recommend less than 2gm sodium diet.  Atypical chest pain with likely secondary to her heart failure exacerbation and resolved as she diuresed. Her cardiac enzymes were negative x3 and her EKG remained stable.  Paroxysmal atrial fibrillation:  She was in atrial fibrillation which was rate controlled on telemetry however initially  she was bradycardic to the 30s and 40s intermittently with occasional pauses up to and just greater than 2 seconds. Her carvedilol dose was decreased slightly and her Cardizem with maintained at its previous dose. Her heart rate trended up to the  60s and her pauses were less than 2 seconds on telemetry after this change was made. She continued anticoagulation with pradaxa.    Hypertension with mildly low blood pressures. Her blood pressure increased after reducing the dose of her Coreg. She continued Cardizem CD. She may continue her Imdur and lisinopril for now.   Cutaneous T-cell lymphoma, very erythematous skin which is chronic.  She should continue Bexarotene and followup with Doctor Granfortuna.    D-dimer was slightly elevated secondary to her underlying malignancy. She is early on anticoagulation, so further evaluation for pulmonary embolism and DVT was not performed at this time.  Duplex lower extremities was negative less than 2 months ago.    Leukocytosis is at its baseline and is secondary to the patient's cutaneous T-cell lymphoma. Her urinalysis was negative and her chest x-ray was not convincing for underlying infection. Her blood cultures are no growth to date.   Deconditioning which is likely due to multiple comorbidities. She was evaluated by physical and observational therapy recommended she return to skilled nursing facility. The family had requested that she be assessed by inpatient rehabilitation, however, after reviewing the patient's notes and previous physical therapy sessions they recommended that she return to skilled nursing.   Consultants:  None Procedures:  Echo Antibiotics:  None    Discharge Exam: Filed Vitals:   08/30/13 0657  BP: 136/61  Pulse: 77  Temp: 98.1 F (36.7 C)  Resp: 16   Filed Vitals:   08/29/13 1413 08/29/13 2215 08/30/13 0657 08/30/13 0850  BP: 125/44 117/42 136/61   Pulse: 62 60 77   Temp: 98.1 F (36.7 C) 98.6 F (37 C) 98.1 F (36.7 C)   TempSrc: Oral Oral    Resp: 20 20 16    Height:      Weight:   62.9 kg (138 lb 10.7 oz)   SpO2: 99% 97% 95% 96%    General: Caucasian female, No acute distress  HEENT: NCAT, MMM  Cardiovascular: RRR, nl S1, S2 no mrg, 2+ pulses,  warm extremities  Respiratory:  Faint rales at the bases, markedly improved, no focal rhonchi or wheeze, no increased WOB  Abdomen: NABS, soft, NT/ND  MSK: Decreased tone and bulk, no LEE  Neuro: Grossly intact  Skin: Very erythematous back, arms, hands   Discharge Instructions      Discharge Orders   Future Appointments Provider Department Dept Phone   09/05/2013 2:20 PM Lorin [facility] [phone]   09/11/2013 3:30 PM [facility] [phone]   09/11/2013 4:00 PM Levert Feinstein, MD [facility] [phone]   Future Orders Complete By Expires   (HEART FAILURE PATIENTS) Call MD:  Anytime you have any of the following symptoms: 1) 3 pound weight gain in 24 hours or 5 pounds in 1 week 2) shortness of breath, with or without a dry hacking cough 3) swelling in the hands, feet or stomach 4) if you have to sleep on extra pillows at night in order to breathe.  As directed    Call MD for:  difficulty breathing, headache or visual disturbances  As directed    Call MD for:  extreme fatigue  As directed  Call MD for:  hives  As directed    Call MD for:  persistant dizziness or light-headedness  As directed    Call MD for:  persistant nausea and vomiting  As directed    Call MD for:  severe uncontrolled pain  As directed    Call MD for:  temperature >100.4  As directed    Diet - low sodium heart healthy  As directed    Increase activity slowly  As directed        Medication List    STOP taking these medications       moxifloxacin 400 MG tablet  Commonly known as:  AVELOX      TAKE these medications       atorvastatin 40 MG tablet  Commonly known as:  LIPITOR  Take 1 tablet (40 mg total) by mouth daily at 6 PM.     bexarotene 75 MG Caps capsule  Commonly known as:  TARGRETIN  Take 150 mg by mouth daily before supper. Give with food. Protect from light. CAUTION:  Chemotherapy/Biotherapy-Wear gloves when handling     carvedilol 12.5 MG tablet  Commonly known as:  COREG  Take 1 tablet (12.5 mg total) by mouth 2 (two) times daily with a meal.     dabigatran 150 MG Caps capsule  Commonly known as:  PRADAXA  Take 75 mg by mouth every 12 (twelve) hours.     diltiazem 120 MG 24 hr capsule  Commonly known as:  CARDIZEM CD  Take 120 mg by mouth daily. Take 1 tablet once daily for BP.     famotidine 20 MG tablet  Commonly known as:  PEPCID  Take 40 mg by mouth daily.     Fish Oil 1000 MG Caps  Take 1,000 mg by mouth at bedtime.     folic acid 1 MG tablet  Commonly known as:  FOLVITE  Take 1 mg by mouth daily.     furosemide 40 MG tablet  Commonly known as:  LASIX  Take 1.5 tablets (60 mg total) by mouth 2 (two) times daily.     gabapentin 100 MG capsule  Commonly known as:  NEURONTIN  Take 1 capsule (100 mg total) by mouth at bedtime.     guaiFENesin 600 MG 12 hr tablet  Commonly known as:  MUCINEX  Take by mouth 2 (two) times daily.     HYDROcodone-acetaminophen 5-325 MG per tablet  Commonly known as:  NORCO/VICODIN  Take 1 tablet by mouth every 6 (six) hours as needed for moderate pain or severe pain.     ipratropium-albuterol 0.5-2.5 (3) MG/3ML Soln  Commonly known as:  DUONEB  Take 3 mLs by nebulization.     isosorbide mononitrate 30 MG 24 hr tablet  Commonly known as:  IMDUR  Take 1 tablet (30 mg total) by mouth daily.     levocetirizine 5 MG tablet  Commonly known as:  XYZAL  Take 5 mg by mouth as needed for allergies.     lisinopril 10 MG tablet  Commonly known as:  PRINIVIL,ZESTRIL  Take 10 mg by mouth daily.     nitroGLYCERIN 0.4 MG SL tablet  Commonly known as:  NITROSTAT  Place 0.4 mg under the tongue every 5 (five) minutes as needed for chest pain. If no relief call MD.     polyethylene glycol packet  Commonly known as:  MIRALAX / GLYCOLAX  Take 17 g by mouth every other day. Take 1 packet mixed in  4 oz of  beverage every day to relieve constipation.     REFRESH OP  Apply 1 drop to eye 4 (four) times daily. Instill 1 drop to both eyes four times daily.*Wait 3-5 minutes between eye drops*     saccharomyces boulardii 250 MG capsule  Commonly known as:  FLORASTOR  Take 250 mg by mouth 2 (two) times daily.     vitamin C 500 MG tablet  Commonly known as:  ASCORBIC ACID  Take 500 mg by mouth daily.       Follow-up Information   Follow up with GREEN, Lenon Curt, MD. Schedule an appointment as soon as possible for a visit in 2 weeks.   Specialty:  Internal Medicine   Contact information:   [address] [place] [address] [phone]       Follow up with Abelino Derrick, PA-C On 09/05/2013. (2:20 PM)    Specialty:  Cardiology   Contact information:   [address] [place] [address] [phone]        The results of significant diagnostics from this hospitalization (including imaging, microbiology, ancillary and laboratory) are listed below for reference.    Significant Diagnostic Studies: Dg Chest Port 1 View  08/30/2013   CLINICAL DATA:  History of CHF with worsening.  EXAM: PORTABLE CHEST - 1 VIEW  COMPARISON:  Chest x-ray August 27, 2013.  FINDINGS: The lungs are reasonably well inflated. There is no alveolar infiltrate. Small bilateral pleural effusions persist at the lung bases. The cardiac silhouette remains enlarged. The central pulmonary vascularity is prominent but the pulmonary interstitial markings are slightly less conspicuous today. There are degenerative changes of the thoracic spine. There is old deformity of the humeral neck on the left.  IMPRESSION: The findings are consistent with the CHF with evidence of some mild improvement in the interstitial edema since the earlier study.   Electronically Signed   By: David  [place]   On: 08/30/2013 11:39   Dg Chest Portable 1 View  08/27/2013   CLINICAL DATA:  Cough, congestion  EXAM: PORTABLE CHEST - 1  VIEW  COMPARISON:  12/03/2012  FINDINGS: Cardiomediastinal silhouette is stable. Central mild vascular congestion without pulmonary edema. Bilateral small pleural effusion with bilateral basilar atelectasis or infiltrate.  IMPRESSION: Central vascular congestion without convincing pulmonary edema. Bilateral small pleural effusion with bilateral basilar atelectasis or infiltrate.   Electronically Signed   By: Natasha Mead M.D.   On: 08/27/2013 10:21    Microbiology: Recent Results (from the past 240 hour(s))  CULTURE, BLOOD (ROUTINE X 2)     Status: None   Collection Time    08/27/13  6:35 PM      Result Value Range Status   Specimen Description BLOOD RIGHT ARM   Final   Special Requests BOTTLES DRAWN AEROBIC AND ANAEROBIC 10CC   Final   Culture  Setup Time     Final   Value: 08/28/2013 00:18     Performed at [facility]   Culture     Final   Value:        BLOOD CULTURE RECEIVED NO GROWTH TO DATE CULTURE WILL BE HELD FOR 5 DAYS BEFORE ISSUING A FINAL NEGATIVE REPORT     Performed at [facility]   Report Status PENDING   Incomplete  CULTURE, BLOOD (ROUTINE X 2)     Status: None   Collection Time    08/27/13  6:40 PM      Result  Value Range Status   Specimen Description BLOOD RIGHT HAND   Final   Special Requests BOTTLES DRAWN AEROBIC AND ANAEROBIC 10CC   Final   Culture  Setup Time     Final   Value: 08/28/2013 00:18     Performed at [facility]   Culture     Final   Value:        BLOOD CULTURE RECEIVED NO GROWTH TO DATE CULTURE WILL BE HELD FOR 5 DAYS BEFORE ISSUING A FINAL NEGATIVE REPORT     Performed at [facility]   Report Status PENDING   Incomplete  MRSA PCR SCREENING     Status: Abnormal   Collection Time    08/27/13  6:41 PM      Result Value Range Status   MRSA by PCR POSITIVE (*) NEGATIVE Final   Comment:            The GeneXpert MRSA Assay (FDA     approved for NASAL specimens     only), is one component of a     comprehensive MRSA  colonization     surveillance program. It is not     intended to diagnose MRSA     infection nor to guide or     monitor treatment for     MRSA infections.     RESULT CALLED TO, READ BACK BY AND VERIFIED WITH:     MORRIS,M/4W @2135  ON 08/27/13 BY KARCZEWSKI,S.     Labs: Basic Metabolic Panel:  Recent Labs Lab 08/27/13 1150 08/27/13 1835 08/28/13 0445 08/29/13 0456 08/30/13 0453  NA 141  --  137 139 134*  K 3.6  --  3.7 3.7 4.0  CL 99  --  97 98 94*  CO2 31  --  31 29 29   GLUCOSE 90  --  97 97 103*  BUN 12  --  17 23 30*  CREATININE 0.58  --  0.72 0.79 0.85  CALCIUM 9.5  --  9.5 9.5 9.4  MG  --  2.0  --   --   --    Liver Function Tests:  Recent Labs Lab 08/27/13 1150  AST 16  ALT 11  ALKPHOS 76  BILITOT 0.3  PROT 7.0  ALBUMIN 3.6   No results found for this basename: LIPASE, AMYLASE,  in the last 168 hours No results found for this basename: AMMONIA,  in the last 168 hours CBC:  Recent Labs Lab 08/27/13 1015 08/28/13 0445  WBC 32.8* 29.8*  NEUTROABS 10.2* 6.3  HGB 14.2 11.5*  HCT 42.0 35.5*  MCV 92.9 93.9  PLT 287 266   Cardiac Enzymes:  Recent Labs Lab 08/27/13 1150 08/27/13 1835 08/27/13 2301  TROPONINI <0.30 <0.30 <0.30   BNP: BNP (last 3 results)  Recent Labs  10/18/12 2010 08/27/13 1015 08/30/13 0453  PROBNP 4481.0* 2534.0* 802.3*   CBG: No results found for this basename: GLUCAP,  in the last 168 hours  Time coordinating discharge: 45 minutes  Signed:  Lamija Besse  [facility] 08/30/2013, 2:32 PM

## 2013-08-30 NOTE — Evaluation (Signed)
Physical Therapy Evaluation Patient Details Name: Sara [name] MRN: [id number] DOB: July 15, 1919 Today's Date: 08/30/2013 Time: 0920-0959 PT Time Calculation (min): 39 min  PT Assessment / Plan / Recommendation History of Present Illness  79 yo female admitted with CHR, bronchospasm. Hx of Afib, HTN, CAD, MI, lymphoma, R AKA, CVA  Clinical Impression  On eval, pt required +2 assist for mobility-able to perform stand pivot with RW, bed>recliner. Demonstrates general weakness, impaired dynamic sitting balance and static/dynamic standing balance. Pt/family was requesting CIR however pt was not deemed a candidate. Recommend return to SNF and possibly another trial of PT, if at all possible.     PT Assessment  Patient needs continued PT services    Follow Up Recommendations  SNF    Does the patient have the potential to tolerate intense rehabilitation      Barriers to Discharge        Equipment Recommendations  None recommended by PT    Recommendations for Other Services OT consult   Frequency Min 2X/week    Precautions / Restrictions Precautions Precautions: Fall Precaution Comments: R AKA Restrictions Weight Bearing Restrictions: No   Pertinent Vitals/Pain No c/o pain      Mobility  Bed Mobility Bed Mobility: Supine to Sit Supine to Sit: 3: Mod assist;HOB elevated;With rails Details for Bed Mobility Assistance: Increased time. Assist for trunk to upright and L LE off bed. Utilized bedpad for scooting, positioning Transfers Transfers: Sit to Stand;Stand to [employer] Transfers Sit to Stand: 1: +2 Total assist;From bed Sit to Stand: Patient Percentage: 30% Stand to Sit: 1: +2 Total assist;To chair/3-in-1 Stand to Sit: Patient Percentage: 30%. Pt has difficulty keeping L LE within BOS Stand Pivot Transfers: 1: +2 Total assist Stand Pivot Transfers: Patient Percentage: 30% Details for Transfer Assistance: Assist to rise, stabilize, control descent. Sit to stand x2  with walker. Pt able to perform stand pivot with walker however required significant assist to do this.  Ambulation/Gait Ambulation/Gait Assistance: Not tested (comment)    Exercises     PT Diagnosis: Difficulty walking  PT Problem List: Decreased strength;Decreased balance;Decreased activity tolerance;Decreased mobility;Decreased knowledge of use of DME;Decreased safety awareness PT Treatment Interventions: DME instruction;Functional mobility training;Therapeutic activities;Therapeutic exercise;Patient/family education;Balance training;Wheelchair mobility training     PT Goals(Current goals can be found in the care plan section) Acute Rehab PT Goals Patient Stated Goal: to get back into therapy and to try to get as independent as possible PT Goal Formulation: With patient/family Time For Goal Achievement: 09/13/13 Potential to Achieve Goals: Fair  Visit Information  Last PT Received On: 08/30/13 Assistance Needed: +2 History of Present Illness: 79 yo female admitted with CHR, bronchospasm. Hx of Afib, HTN, CAD, MI, lymphoma, R AKA, CVA       Prior Functioning  Home Living Family/patient expects to be discharged to:: Skilled nursing facility Prior Function Level of Independence: Needs assistance Gait / Transfers Assistance Needed: +1 assist for bed to chair transfers Communication Communication: No difficulties    Cognition  Cognition Arousal/Alertness: Awake/alert Behavior During Therapy: WFL for tasks assessed/performed Overall Cognitive Status: Within Functional Limits for tasks assessed    Extremity/Trunk Assessment Upper Extremity Assessment Upper Extremity Assessment: Generalized weakness Lower Extremity Assessment Lower Extremity Assessment: Generalized weakness;LLE deficits/detail LLE Deficits / Details: Strength at least 4/5 except hip flex 3+/5, knee flex 3+/5 Cervical / Trunk Assessment Cervical / Trunk Assessment: Kyphotic   Balance Balance Balance  Assessed: Yes Static Sitting Balance Static Sitting - Balance Support: Bilateral upper  extremity supported;Feet supported Static Sitting - Level of Assistance: 5: Stand by assistance Dynamic Sitting Balance Dynamic Sitting - Balance Support: Bilateral upper extremity supported;Feet supported Dynamic Sitting - Level of Assistance: 3: Mod assist Static Standing Balance Static Standing - Balance Support: Bilateral upper extremity supported Static Standing - Level of Assistance: 1: +1 Total assist  End of Session PT - End of Session Equipment Utilized During Treatment: Gait belt Activity Tolerance: Patient tolerated treatment well Patient left: in chair;with call bell/phone within reach;with family/visitor present  GP     Rebeca Alert, MPT Pager: [phone]

## 2013-08-30 NOTE — Progress Notes (Signed)
OT Cancellation Note  Patient Details Name: RAYSA BOSAK MRN: [id number] DOB: 12/03/1918   Cancelled Treatment:    Reason Eval/Treat Not Completed: OT screened, no needs identified, will sign off From SNF. Will defer OT eval.  Lennox Laity [phone] 08/30/2013, 1:33 PM

## 2013-08-30 NOTE — Progress Notes (Signed)
Patient is set to discharge back to [facility] SN[facility] today. Patient & daughter aware. Discharge packet given to RN, Tess. [facility] called for transport.   Unice Bailey, LCSW [facility] Clinical Social Worker cell #: [phone]

## 2013-09-03 ENCOUNTER — Encounter: Payer: Self-pay | Admitting: Nurse Practitioner

## 2013-09-03 ENCOUNTER — Non-Acute Institutional Stay (SKILLED_NURSING_FACILITY): Payer: Medicare Other | Admitting: Nurse Practitioner

## 2013-09-03 DIAGNOSIS — K5900 Constipation, unspecified: Secondary | ICD-10-CM

## 2013-09-03 DIAGNOSIS — I10 Essential (primary) hypertension: Secondary | ICD-10-CM

## 2013-09-03 DIAGNOSIS — I2510 Atherosclerotic heart disease of native coronary artery without angina pectoris: Secondary | ICD-10-CM

## 2013-09-03 DIAGNOSIS — I5032 Chronic diastolic (congestive) heart failure: Secondary | ICD-10-CM

## 2013-09-03 DIAGNOSIS — G546 Phantom limb syndrome with pain: Secondary | ICD-10-CM

## 2013-09-03 DIAGNOSIS — C8404 Mycosis fungoides, lymph nodes of axilla and upper limb: Secondary | ICD-10-CM

## 2013-09-03 DIAGNOSIS — I480 Paroxysmal atrial fibrillation: Secondary | ICD-10-CM

## 2013-09-03 DIAGNOSIS — G547 Phantom limb syndrome without pain: Secondary | ICD-10-CM

## 2013-09-03 DIAGNOSIS — I4891 Unspecified atrial fibrillation: Secondary | ICD-10-CM

## 2013-09-03 LAB — CULTURE, BLOOD (ROUTINE X 2): Culture: NO GROWTH

## 2013-09-03 NOTE — Progress Notes (Signed)
Patient ID: Sara [name], female   DOB: [birth year], 79 y.o.   MRN: [id number]   Code Status: DNR  Allergies  Allergen Reactions  . Evista [Raloxifene]   . Penicillins Other (See Comments)    unknown  . Triamterene Other (See Comments)    unknown  . Alendronate Sodium Rash    Pt stated oh k to use biotene    Chief Complaint  Patient presents with  . Medical Managment of Chronic Issues  . Acute Visit  . Hospitalization Follow-up    HPI: Patient is a 79 y.o. female seen in the SNF at [facility] today for evaluation of recent hospital stay for acute on chronic CHF, PNA,  and other chronic medical conditions. Hospitalization from 08/27/2013-08/30/2013 for CHF-Furosemide was increased to 60mg  bid and decreased Carvedilol due to bradycardia. Cardiology on Dec 24th at 2:20pm with [facility]. The patient  has a history of atrial fibrillation, hypertension, CAD with MI, diastolic CHF, and cutaneous T-Cell Lymphoma, right AKA. She presented to ED with several-day history of shortness of breath with increased left lower extremity edema, orthopnea, and increasing abdominal girth intermittent chest discomfort. She also complains of a cough with yellow sputum.The patient finished a five-day course of Avelox for bronchitis prior to ED evaluation.In the ED, the patient was noted to have proBNP 2534, d-dimer 2.97, chest x-ray with central vascular congestion and bilateral pleural effusion, and EKG showing sinus rhythm with nonspecific ST changes. CBC showed WBC 32.8. BMP was unremarkable. Troponin was negative x1. She received IV furosemide as well as aerosolized albuterol and Atrovent with some improvement of her shortness of breath.   Problem List Items Addressed This Visit   Chronic diastolic HF (heart failure) - Primary (Chronic)     Daily weights. Increased lasix to 60mg  bid and decreased carvedilol slightly due to bradycardia. Hospital chest x-ray showed improvement in her pulmonary edema and  effusions. Her proBNP trended down from 2534 to 802.     Phantom limb pain (Chronic)     Right AKA--managed with Gabapentin 100mg  nightly.               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 Cyndie Chime at routine follow up, continue Bexarotene and followup with Doctor Cyndie Chime.      HTN (hypertension)      Controlled on Coreg, Cardizem DC,  Imdur,  and lisinopril for now.      PAF (paroxysmal atrial fibrillation)      bradycardic to the 30s and 40s intermittently with occasional pauses up to and just greater than 2 seconds while in hospital. Resolved to 60s after Carvedilol was slightly reduced. Continued anticoagulation with pradaxa.     Unspecified constipation     Managed with MiraLax qod                Coronary artery disease     Risk reduction with Atorvastatin 40mg  and Fish oil. Takes Imdur 30mg  daily.              Review of Systems:  Review of Systems  Constitutional: Negative for fever, chills, weight loss, malaise/fatigue and diaphoresis.  HENT: Positive for hearing loss. Negative for congestion, ear discharge, ear pain and sore throat.   Eyes: Negative for pain, discharge and redness.  Respiratory: Positive for cough. Negative for sputum production, shortness of breath and wheezing.   Cardiovascular: Positive for leg swelling. Negative for chest pain, palpitations, orthopnea, claudication and PND.  Trace  Gastrointestinal: Negative for heartburn, nausea, vomiting, abdominal pain, diarrhea, constipation and blood in stool.  Genitourinary: Positive for frequency (incontinent of bladder). Negative for dysuria, urgency, hematuria and flank pain.  Musculoskeletal: Positive for joint pain. Negative for back pain, falls, myalgias and neck pain.  Skin: Positive for itching and rash.       Chronic psoriasis, very thin and red skin, frequent skin abrasion or tear. Left wrist wart appearance skin lesion.     Neurological: Negative for dizziness, tingling, tremors, sensory change, speech change, focal weakness, seizures, loss of consciousness, weakness (generalized) and headaches.  Endo/Heme/Allergies: Negative for environmental allergies and polydipsia. Bruises/bleeds easily.  Psychiatric/Behavioral: Positive for memory loss. Negative for depression and hallucinations. The patient is not nervous/anxious and does not have insomnia.      Past Medical History  Diagnosis Date  . Hypertension   . Atrial fibrillation   . Psoriasis   . Congestive heart failure (CHF)   . History of CVA (cerebrovascular accident)   . Coronary artery disease   . Myocardial infarction   . CHF (congestive heart failure)   . Shortness of breath   . H/O hiatal hernia   . Closed fracture of head of left humerus 5/12  . Basal cell carcinoma   . 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  Flow cytometry 04/04/12: 91% cells CD4 positive CD 26 negative  . Hyperlipemia   . Pneumonia   . Wound of right leg 09/25/2012    Necrotic, open wound right lower leg; non healing  . Stroke   . Hyposmolality and/or hyponatremia   . Lower limb amputation, above knee 10/11/2012  . Pain in joint, multiple sites   . Mycosis fungoides, unspecified site, extranodal and solid organ sites 09/28/2012  . Occlusion and stenosis of carotid artery without mention of cerebral infarction 09/28/2012  . Chronic venous hypertension with ulcer and inflammation 09/28/2012  . Diverticulosis of colon (without mention of hemorrhage) 09/28/2012  . Synovial cyst of popliteal space 03/26/2012  . Unspecified constipation 03/24/2012  . Hypopotassemia 01/26/2012  . Debility, unspecified 01/17/2012  . Anxiety state, unspecified 12/31/2011  . Anemia, unspecified 12/21/2011  . Other specified disease of white blood cells  12/21/2011  . Peripheral vascular disease, unspecified 12/21/2011    recent ABI Lt of 0.64 down from ).80 on 12/29/11- though different labs  . Reflux esophagitis 12/21/2011  . Unspecified hereditary and idiopathic peripheral neuropathy 12/14/2011  . Pain in joint, ankle and foot 12/14/2011  . Carotid stenosis     CAROTID DOPPLER, 05/02/2012 - LEFT VERTEBRAL-occluded, LEFT BULB AND PROXIMAL ICA STENT-moderate amount of irregular mixed dense plaque 50-69% diameter reduction  . Pre-syncope     NUCLEAR STRESS TEST, 12/10/2009 - normal, EKG negative for ischemia  . TIA (transient ischemic attack)     2D ECHO, 12/05/2012 - EF 60-65%, Severely calcified annulus of the mitral valve with mild-moderate regurgitation, LA moderate-severely dilated  . Paroxysmal atrial fibrillation   . CAD in native artery     3 vessel CAD, medical therapy, not a candidate for CABG  . S/P AKA (above knee amputation) unilateral, Rt leg due to PAD and non healing wound 06/27/2013  . HCAP (healthcare-associated pneumonia) 01/08/2012   Past Surgical History  Procedure Laterality Date  . Tonsillectomy    . Dilation and curettage of uterus    . Eye  surgery    . Cataracts    . Vertebroplasty    . Amputation  10/10/2012    Procedure: AMPUTATION ABOVE KNEE;  Surgeon: Nadara Mustard, MD;  Location: [facility] OR;  Service: Orthopedics;  Laterality: Right;  . Percutaneous stent intervention  04/13/2011    Left internal carotid artery stented with a 8x30 prcise nitinol self-expanding stent resulting reduction of 90% to less than 20% residual  . Cardiac catheterization  11/06/2010    CABG recommended, turned down by [facility] secondary to co morbidities   Social History:   reports that she has never smoked. She has never used smokeless tobacco. She reports that she does not drink alcohol or use illicit drugs.  Family History  Problem Relation Age of Onset  . Stomach cancer Mother   . Pneumonia Father   . Cirrhosis Brother   . Diabetes  Brother   . Heart attack Paternal Grandmother   . Heart failure Sister   . Diabetes Sister   . Heart failure Sister     Medications: Patient's Medications  New Prescriptions   No medications on file  Previous Medications   ATORVASTATIN (LIPITOR) 40 MG TABLET    Take 1 tablet (40 mg total) by mouth daily at 6 PM.   BEXAROTENE (TARGRETIN) 75 MG CAPS CAPSULE    Take 150 mg by mouth daily before supper. Give with food. Protect from light. CAUTION: Chemotherapy/Biotherapy-Wear gloves when handling   CARVEDILOL (COREG) 12.5 MG TABLET    Take 1 tablet (12.5 mg total) by mouth 2 (two) times daily with a meal.   DABIGATRAN (PRADAXA) 150 MG CAPS    Take 75 mg by mouth every 12 (twelve) hours.    DILTIAZEM (CARDIZEM CD) 120 MG 24 HR CAPSULE    Take 120 mg by mouth daily. Take 1 tablet once daily for BP.   FAMOTIDINE (PEPCID) 20 MG TABLET    Take 40 mg by mouth daily.   FOLIC ACID (FOLVITE) 1 MG TABLET    Take 1 mg by mouth daily.   FUROSEMIDE (LASIX) 40 MG TABLET    Take 1.5 tablets (60 mg total) by mouth 2 (two) times daily.   GABAPENTIN (NEURONTIN) 100 MG CAPSULE    Take 1 capsule (100 mg total) by mouth at bedtime.   GUAIFENESIN (MUCINEX) 600 MG 12 HR TABLET    Take by mouth 2 (two) times daily.   HYDROCODONE-ACETAMINOPHEN (NORCO/VICODIN) 5-325 MG PER TABLET    Take 1 tablet by mouth every 6 (six) hours as needed for moderate pain or severe pain.   IPRATROPIUM-ALBUTEROL (DUONEB) 0.5-2.5 (3) MG/3ML SOLN    Take 3 mLs by nebulization.   ISOSORBIDE MONONITRATE (IMDUR) 30 MG 24 HR TABLET    Take 1 tablet (30 mg total) by mouth daily.   LEVOCETIRIZINE (XYZAL) 5 MG TABLET    Take 5 mg by mouth as needed for allergies.   LISINOPRIL (PRINIVIL,ZESTRIL) 10 MG TABLET    Take 10 mg by mouth daily.   NITROGLYCERIN (NITROSTAT) 0.4 MG SL TABLET    Place 0.4 mg under the tongue every 5 (five) minutes as needed for chest pain. If no relief call MD.   OMEGA-3 FATTY ACIDS (FISH OIL) 1000 MG CAPS    Take 1,000 mg by  mouth at bedtime.    POLYETHYLENE GLYCOL (MIRALAX / GLYCOLAX) PACKET    Take 17 g by mouth every other day. Take 1 packet mixed in 4 oz of beverage every day to relieve constipation.  POLYVINYL ALCOHOL-POVIDONE (REFRESH OP)    Apply 1 drop to eye 4 (four) times daily. Instill 1 drop to both eyes four times daily.*Wait 3-5 minutes between eye drops*   SACCHAROMYCES BOULARDII (FLORASTOR) 250 MG CAPSULE    Take 250 mg by mouth 2 (two) times daily.   VITAMIN C (ASCORBIC ACID) 500 MG TABLET    Take 500 mg by mouth daily.  Modified Medications   No medications on file  Discontinued Medications   No medications on file     Physical Exam: Physical Exam  Constitutional: She is oriented to person, place, and time. She appears well-developed and well-nourished. No distress.  HENT:  Head: Normocephalic.  Right Ear: External ear normal.  Left Ear: External ear normal.  Nose: Nose normal.  Mouth/Throat: Oropharynx is clear and moist. No oropharyngeal exudate.  Eyes: EOM are normal. Pupils are equal, round, and reactive to light. Right conjunctiva is injected.  Only a few eyelashes remained and right conjunctiva mild irritated with bilateral lower eyelid eversion.   Neck: Normal range of motion. Neck supple. JVD present. No thyromegaly present.  Cardiovascular: Normal rate, regular rhythm and normal heart sounds.  Exam reveals decreased pulses.   No murmur heard. Pulses:      Dorsalis pedis pulses are 1+ on the left side.       Posterior tibial pulses are 1+ on the left side.  Pulmonary/Chest: Effort normal and breath sounds normal. No respiratory distress. She has no wheezes. She exhibits no tenderness.  Decreased breath sounds posterior mid lungs.   Abdominal: Soft. Bowel sounds are normal. She exhibits no distension. There is no tenderness.  Genitourinary:  Incontinent of bladder  Musculoskeletal: Normal range of motion. She exhibits edema. She exhibits no tenderness.  R AKA. Left lower leg  edema appears new.   Lymphadenopathy:    She has no cervical adenopathy.  Neurological: She is alert and oriented to person, place, and time. She has normal reflexes. No cranial nerve deficit. She exhibits normal muscle tone. Coordination normal.  Skin: Rash noted. There is erythema.  Diffused erythematous scaly skin appearance. Psoriasis. Left wrist wart appearance skin lesion--f/u Dermatology.   Psychiatric: She has a normal mood and affect. Her behavior is normal. Judgment and thought content normal. She exhibits abnormal recent memory.    Filed Vitals:   09/03/13 1611  BP: 138/74  Pulse: 72  Temp: 97 F (36.1 C)  TempSrc: Tympanic  Resp: 18      Labs reviewed: Basic Metabolic Panel:  Recent Labs  08/65/78 07/24/13 1415 08/02/13  08/27/13 1835 08/28/13 0445 08/29/13 0456 08/30/13 0453  NA 139 140 140  < >  --  137 139 134*  K 4.0 4.0 4.3  < >  --  3.7 3.7 4.0  CL  --   --   --   < >  --  97 98 94*  CO2  --  27  --   < >  --  31 29 29   GLUCOSE  --  136  --   < >  --  97 97 103*  BUN 14 12.7 13  < >  --  17 23 30*  CREATININE 0.6 0.7 0.6  < >  --  0.72 0.79 0.85  CALCIUM  --  9.6  --   < >  --  9.5 9.5 9.4  MG  --   --   --   --  2.0  --   --   --  TSH 1.15 0.781 0.99  --   --   --   --   --   < > = values in this interval not displayed. Liver Function Tests:  Recent Labs  06/12/13 1520  07/24/13 1415 08/02/13 08/27/13 1150  AST 19  < > 16 1* 16  ALT 12  < > 9 10 11   ALKPHOS 68  < > 64 63 76  BILITOT 0.29  --  0.38  --  0.3  PROT 7.1  --  6.8  --  7.0  ALBUMIN 3.3*  --  3.5  --  3.6  < > = values in this interval not displayed.  Recent Labs  07/24/13 1415  LIPASE 20  AMYLASE 20   CBC:  Recent Labs  07/24/13 1415 08/02/13 08/27/13 1015 08/28/13 0445  WBC 27.8* 27.0 32.8* 29.8*  NEUTROABS 7.8*  --  10.2* 6.3  HGB 11.8 13.9 14.2 11.5*  HCT 37.2 41 42.0 35.5*  MCV 94.9  --  92.9 93.9  PLT 272 296 287 266   Lipid Panel:  Recent Labs   10/06/12 0648 11/02/12 0530 12/04/12 0820 12/14/12  06/11/13 07/24/13 1415 08/02/13  CHOL 147 203* 218* 181  --   --   --   --   HDL 29* 30* 34* 30*  --   --   --   --   LDLCALC 92 138* 143* 106  --   --   --   --   TRIG 129 175* 207* 225*  < > 201* 248* 188*  CHOLHDL 5.1 6.8 6.4  --   --   --   --   --   < > = values in this interval not displayed.  Past Procedures:  Dg Chest Port 1 View  08/30/2013 CLINICAL DATA: History of CHF with worsening. EXAM: PORTABLE CHEST - 1 VIEW  IMPRESSION: The findings are consistent with the CHF with evidence of some mild improvement in the interstitial edema since the earlier study.   Dg Chest Portable 1 View  08/27/2013 CLINICAL DATA: Cough, congestion EXAM: PORTABLE CHEST - 1 VIEW IMPRESSION: Central vascular congestion without convincing pulmonary edema. Bilateral small pleural effusion with bilateral basilar atelectasis or infiltrate.   Assessment/Plan Chronic diastolic HF (heart failure) Daily weights. Increased lasix to 60mg  bid and decreased carvedilol slightly due to bradycardia. Hospital chest x-ray showed improvement in her pulmonary edema and effusions. Her proBNP trended down from 2534 to 802.   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 Cyndie Chime at routine follow up, continue Bexarotene and followup with Doctor Cyndie Chime.    PAF (paroxysmal atrial fibrillation)  bradycardic to the 30s and 40s intermittently with occasional pauses up to and just greater than 2 seconds while in hospital. Resolved to 60s after Carvedilol was slightly reduced. Continued anticoagulation with pradaxa.   HTN (hypertension)  Controlled on Coreg, Cardizem DC,  Imdur,  and lisinopril for now.    Unspecified constipation Managed with MiraLax qod              Coronary artery disease Risk reduction with Atorvastatin 40mg  and Fish oil. Takes Imdur 30mg  daily.         Phantom limb pain Right AKA--managed with  Gabapentin 100mg  nightly.               Family/ Staff Communication: observe the patient  Goals of Care: SNF  Labs/tests ordered: Repeat BMP and CBC in 1-2 weeks to trend WBC and anemia,  potassium and creatinine after increasing lasix.

## 2013-09-03 NOTE — Assessment & Plan Note (Addendum)
Dr. Cyndie Chime at routine follow up, continue Bexarotene and followup with Doctor Cyndie Chime.

## 2013-09-03 NOTE — Assessment & Plan Note (Addendum)
bradycardic to the 30s and 40s intermittently with occasional pauses up to and just greater than 2 seconds while in hospital. Resolved to 60s after Carvedilol was slightly reduced. Continued anticoagulation with pradaxa.   

## 2013-09-03 NOTE — Assessment & Plan Note (Signed)
Right AKA--managed with Gabapentin 100mg nightly 

## 2013-09-03 NOTE — Assessment & Plan Note (Addendum)
Daily weights. Increased lasix to 60mg  bid and decreased carvedilol slightly due to bradycardia. Hospital chest x-ray showed improvement in her pulmonary edema and effusions. Her proBNP trended down from 2534 to 802.

## 2013-09-03 NOTE — Assessment & Plan Note (Signed)
Controlled on Coreg, Cardizem DC,  Imdur,  and lisinopril for now.   

## 2013-09-03 NOTE — Assessment & Plan Note (Signed)
Risk reduction with Atorvastatin 40mg and Fish oil. Takes Imdur 30mg daily.      

## 2013-09-03 NOTE — Assessment & Plan Note (Signed)
Managed with MiraLax qod   

## 2013-09-05 ENCOUNTER — Ambulatory Visit: Payer: Medicare Other | Admitting: Cardiology

## 2013-09-11 ENCOUNTER — Ambulatory Visit: Payer: Medicare Other | Admitting: Cardiology

## 2013-09-11 ENCOUNTER — Other Ambulatory Visit (HOSPITAL_BASED_OUTPATIENT_CLINIC_OR_DEPARTMENT_OTHER): Payer: Medicare Other

## 2013-09-11 ENCOUNTER — Ambulatory Visit (HOSPITAL_BASED_OUTPATIENT_CLINIC_OR_DEPARTMENT_OTHER): Payer: Medicare Other | Admitting: Oncology

## 2013-09-11 ENCOUNTER — Telehealth: Payer: Self-pay | Admitting: *Deleted

## 2013-09-11 VITALS — BP 142/64 | HR 59 | Temp 98.7°F | Resp 16

## 2013-09-11 DIAGNOSIS — I10 Essential (primary) hypertension: Secondary | ICD-10-CM

## 2013-09-11 DIAGNOSIS — C8404 Mycosis fungoides, lymph nodes of axilla and upper limb: Secondary | ICD-10-CM

## 2013-09-11 DIAGNOSIS — L408 Other psoriasis: Secondary | ICD-10-CM

## 2013-09-11 DIAGNOSIS — I4891 Unspecified atrial fibrillation: Secondary | ICD-10-CM

## 2013-09-11 LAB — TRIGLYCERIDES: Triglycerides: 303 mg/dL — ABNORMAL HIGH (ref <150)

## 2013-09-11 LAB — COMPREHENSIVE METABOLIC PANEL (CC13)
ALT: 14 U/L (ref 0–55)
Albumin: 3.6 g/dL (ref 3.5–5.0)
Anion Gap: 15 mEq/L — ABNORMAL HIGH (ref 3–11)
BUN: 12.3 mg/dL (ref 7.0–26.0)
CO2: 25 mEq/L (ref 22–29)
Calcium: 9.4 mg/dL (ref 8.4–10.4)
Chloride: 102 mEq/L (ref 98–109)
Creatinine: 0.7 mg/dL (ref 0.6–1.1)
Glucose: 119 mg/dl (ref 70–140)
Potassium: 3.8 mEq/L (ref 3.5–5.1)
Total Bilirubin: 0.24 mg/dL (ref 0.20–1.20)

## 2013-09-11 LAB — CBC WITH DIFFERENTIAL/PLATELET
BASO%: 0.4 % (ref 0.0–2.0)
Basophils Absolute: 0.1 10*3/uL (ref 0.0–0.1)
EOS%: 1.5 % (ref 0.0–7.0)
HCT: 40.7 % (ref 34.8–46.6)
HGB: 13.2 g/dL (ref 11.6–15.9)
LYMPH%: 73 % — ABNORMAL HIGH (ref 14.0–49.7)
MCH: 30.7 pg (ref 25.1–34.0)
MCHC: 32.5 g/dL (ref 31.5–36.0)
MONO#: 0.8 10*3/uL (ref 0.1–0.9)
NEUT%: 22.7 % — ABNORMAL LOW (ref 38.4–76.8)
RBC: 4.31 10*6/uL (ref 3.70–5.45)
WBC: 33 10*3/uL — ABNORMAL HIGH (ref 3.9–10.3)
lymph#: 24.1 10*3/uL — ABNORMAL HIGH (ref 0.9–3.3)

## 2013-09-11 LAB — LIPASE: Lipase: 29 U/L (ref 0–75)

## 2013-09-11 NOTE — Progress Notes (Signed)
Hematology and Oncology Follow Up Visit  Sara [name] [id number] 1918/12/28 79 y.o. 09/11/2013 6:28 PM   Principle Diagnosis: Encounter Diagnosis  Name Primary?  . 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 At the time of her followup visit here in November, I was pleasantly surprised to see that she appear to be having an ongoing objective response to Targretin at current dose 150 mg daily.  It has now been over year since diagnosis in July 2013. The Targretin was started in January 2014. She had a number of major medical problems over the last 6 months detailed in my progress note dated 12/19/2012. The Targretin was initially started a dose of 75 mg daily. Dose was escalated at the end of March. She has tolerated the drug well. We have monitored renal function, liver function, thyroid function and lipid profile as well as her CBC. She has a persistent leukocytosis with absolute lymphocytosis but total white count has remained stable. Hemoglobin has remained stable. Platelet count has remained normal. She has minimal adenopathy exam limited to her axillae. No central adenopathy or splenomegaly on CT scans.  Unfortunately, she had a recent setback. She was hospitalized on December 15 through December 18 for acute on chronic heart failure.  Since discharge she has had a flareup of her skin condition. She has had the return of diffuse erythema and is starting to have desquamation of the skin of her hands again. She denies any fever. She relates the flareup in her skin condition to getting a chlorhexidine scrub to prevent MRSA while she was in the hospital.   Medications: reviewed  Allergies:  Allergies  Allergen Reactions  . Evista [Raloxifene]   . Penicillins Other (See Comments)    unknown  . Triamterene Other (See Comments)    unknown  . Alendronate Sodium  Rash    Pt stated oh k to use biotene    Review of Systems: Hematology:  No active bleeding ENT ROS: No sore throat Breast ROS:  Respiratory ROS: See above. Breathing now back to baseline Cardiovascular ROS:   See above Gastrointestinal ROS:   No abdominal pain Genito-Urinary ROS: No urinary tract symptoms Musculoskeletal ROS: No muscle or bone pain Neurological ROS: No headache or change in vision Dermatological ROS: See above Remaining ROS negative.  Physical Exam: Blood pressure 142/64, pulse 59, temperature 98.7 F (37.1 C), temperature source Oral, resp. rate 16, SpO2 95.00%. Wt Readings from Last 3 Encounters:  08/30/13 138 lb 10.7 oz (62.9 kg)  08/16/13 135 lb 4.8 oz (61.372 kg)  08/02/13 135 lb (61.236 kg)     General appearance: Pleasant, elderly Caucasian woman, HENNT: Pharynx no erythema, exudate, mass, or ulcer. No thyromegaly or thyroid nodules Lymph nodes: No cervical, supraclavicular, or axillary lymphadenopathy Breasts:  Lungs: Clear to auscultation, resonant to percussion throughout Heart: Regular rhythm, no murmur, no gallop, no rub, no click, no edema Abdomen: Soft, nontender, normal bowel sounds, no mass, no organomegaly Extremities: Right above-the-knee amputation. 1+ edema left lower extremity. No calf tenderness. Musculoskeletal: no joint deformities GU:  Vascular: Carotid pulses 2+, no bruits,  Neurologic: Alert, oriented, PERRLA,  , cranial nerves grossly normal, motor strength 5 over 5, reflexes 1+ symmetric, upper body coordination normal, gait normal, Skin: Diffuse erythema trunk and extremities. Desquamative changes of her hands.  Lab Results: CBC W/Diff  White count differential: 23% neutrophils,  73% lymphocytes   Component Value Date/Time   WBC 33.0* 09/11/2013 1527   WBC 29.8* 08/28/2013 0445   WBC 27.0 08/02/2013   RBC 4.31 09/11/2013 1527   RBC 3.78* 08/28/2013 0445   RBC 3.54* 12/08/2011 0605   HGB 13.2 09/11/2013 1527   HGB 11.5*  08/28/2013 0445   HCT 40.7 09/11/2013 1527   HCT 35.5* 08/28/2013 0445   PLT 298 09/11/2013 1527   PLT 266 08/28/2013 0445   MCV 94.4 09/11/2013 1527   MCV 93.9 08/28/2013 0445   MCH 30.7 09/11/2013 1527   MCH 30.4 08/28/2013 0445   MCHC 32.5 09/11/2013 1527   MCHC 32.4 08/28/2013 0445   RDW 12.8 09/11/2013 1527   RDW 13.2 08/28/2013 0445   LYMPHSABS 24.1* 09/11/2013 1527   LYMPHSABS 22.0* 08/28/2013 0445   MONOABS 0.8 09/11/2013 1527   MONOABS 0.9 08/28/2013 0445   EOSABS 0.5 09/11/2013 1527   EOSABS 0.6 08/28/2013 0445   BASOSABS 0.1 09/11/2013 1527   BASOSABS 0.0 08/28/2013 0445     Chemistry      Component Value Date/Time   NA 142 09/11/2013 1527   NA 134* 08/30/2013 0453   NA 140 08/02/2013   K 3.8 09/11/2013 1527   K 4.0 08/30/2013 0453   CL 94* 08/30/2013 0453   CL 102 09/25/2012 1002   CO2 25 09/11/2013 1527   CO2 29 08/30/2013 0453   BUN 12.3 09/11/2013 1527   BUN 30* 08/30/2013 0453   BUN 13 08/02/2013   CREATININE 0.7 09/11/2013 1527   CREATININE 0.85 08/30/2013 0453   CREATININE 0.6 08/02/2013   CREATININE 0.56 01/23/2013 0720   GLU 94 08/02/2013      Component Value Date/Time   CALCIUM 9.4 09/11/2013 1527   CALCIUM 9.4 08/30/2013 0453   ALKPHOS 72 09/11/2013 1527   ALKPHOS 76 08/27/2013 1150   AST 18 09/11/2013 1527   AST 16 08/27/2013 1150   ALT 14 09/11/2013 1527   ALT 11 08/27/2013 1150   BILITOT 0.24 09/11/2013 1527   BILITOT 0.3 08/27/2013 1150       Radiological Studies: Dg Chest Port 1 View  08/30/2013   CLINICAL DATA:  History of CHF with worsening.  EXAM: PORTABLE CHEST - 1 VIEW  COMPARISON:  Chest x-ray August 27, 2013.  FINDINGS: The lungs are reasonably well inflated. There is no alveolar infiltrate. Small bilateral pleural effusions persist at the lung bases. The cardiac silhouette remains enlarged. The central pulmonary vascularity is prominent but the pulmonary interstitial markings are slightly less conspicuous today. There are  degenerative changes of the thoracic spine. There is old deformity of the humeral neck on the left.  IMPRESSION: The findings are consistent with the CHF with evidence of some mild improvement in the interstitial edema since the earlier study.   Electronically Signed   By: David  [place]   On: 08/30/2013 11:39   Dg Chest Portable 1 View  08/27/2013   CLINICAL DATA:  Cough, congestion  EXAM: PORTABLE CHEST - 1 VIEW  COMPARISON:  12/03/2012  FINDINGS: Cardiomediastinal silhouette is stable. Central mild vascular congestion without pulmonary edema. Bilateral small pleural effusion with bilateral basilar atelectasis or infiltrate.  IMPRESSION: Central vascular congestion without convincing pulmonary edema. Bilateral small pleural effusion with bilateral basilar atelectasis or infiltrate.   Electronically Signed   By: Natasha Mead M.D.   On: 08/27/2013 10:21    Impression:  #1. Cutaneous T-cell lymphoma/mycosis fungoides I think current acute flareup in her skin condition is  related to changes have occurred during recent hospitalization. She is discouraged but I tried to reassure her that she did have a nice, ongoing, response to the Targretin and I don't think that we should abandon it yet. I like to continue the medication and reevaluate again in 6 weeks. I told her it might be beneficial to get a followup appointment with her dermatologist.   #2. Long-standing psoriasis.  #3. Nonhealing wound right lateral malleolus with development of osteomyelitis requiring recent right AKA.  #4. Chronic atrial fibrillation with recent embolic stroke. #5. Hypertension.  #6. Coronary artery disease with previous MI;  #7. chronic congestive heart failure with recent acute exacerbation.   CC: Patient Care Team: Kimber Relic, MD as PCP - General (Internal Medicine) Jacqlyn Krauss as Referring Physician (Dermatology) Ria Bush as Consulting Physician (Dermatology) Maryln Gottron, MD as Consulting  Physician (Radiation Oncology) Levert Feinstein, MD as Consulting Physician (Oncology)   Levert Feinstein, MD 12/30/20146:28 PM

## 2013-09-11 NOTE — Telephone Encounter (Signed)
appts made and printed...td 

## 2013-09-12 ENCOUNTER — Ambulatory Visit (INDEPENDENT_AMBULATORY_CARE_PROVIDER_SITE_OTHER): Payer: Medicare Other | Admitting: Cardiology

## 2013-09-12 ENCOUNTER — Encounter: Payer: Self-pay | Admitting: Cardiology

## 2013-09-12 VITALS — BP 170/70 | HR 64 | Ht 66.5 in | Wt 132.0 lb

## 2013-09-12 DIAGNOSIS — I2510 Atherosclerotic heart disease of native coronary artery without angina pectoris: Secondary | ICD-10-CM

## 2013-09-12 DIAGNOSIS — S78119A Complete traumatic amputation at level between unspecified hip and knee, initial encounter: Secondary | ICD-10-CM

## 2013-09-12 DIAGNOSIS — I10 Essential (primary) hypertension: Secondary | ICD-10-CM

## 2013-09-12 DIAGNOSIS — I5033 Acute on chronic diastolic (congestive) heart failure: Secondary | ICD-10-CM

## 2013-09-12 DIAGNOSIS — Z7901 Long term (current) use of anticoagulants: Secondary | ICD-10-CM | POA: Insufficient documentation

## 2013-09-12 DIAGNOSIS — I509 Heart failure, unspecified: Secondary | ICD-10-CM

## 2013-09-12 DIAGNOSIS — C84A0 Cutaneous T-cell lymphoma, unspecified, unspecified site: Secondary | ICD-10-CM

## 2013-09-12 DIAGNOSIS — Z89611 Acquired absence of right leg above knee: Secondary | ICD-10-CM

## 2013-09-12 DIAGNOSIS — C8589 Other specified types of non-Hodgkin lymphoma, extranodal and solid organ sites: Secondary | ICD-10-CM

## 2013-09-12 DIAGNOSIS — I480 Paroxysmal atrial fibrillation: Secondary | ICD-10-CM

## 2013-09-12 DIAGNOSIS — I4891 Unspecified atrial fibrillation: Secondary | ICD-10-CM

## 2013-09-12 DIAGNOSIS — I5032 Chronic diastolic (congestive) heart failure: Secondary | ICD-10-CM

## 2013-09-12 NOTE — Patient Instructions (Signed)
Your physician recommends that you schedule a follow-up appointment in: 3 months with Dr Allyson Sabal  Continue same medications without any changes

## 2013-09-12 NOTE — Progress Notes (Signed)
09/12/2013 Sara [name]   [birth year]  [id number]  Primary Physicia GREEN, Lenon Curt, MD Primary Cardiologist: Dr Allyson Sabal  HPI:  The patient is a 79 year old thin and frail appearing widowed, Caucasian female, mother of two, grandmother of six grandchildren, who has been followed by Dr Allyson Sabal. She lives at [facility] She has a history of hypertension, hyperlipidemia, and CAD, as well as PVOD. She had three vessel disease by cath March 2012, but she was thought to be not a good bypass candidate after being evaluated by Dr. Evelene Croon and medical therapy was recommended. She did have high grade left internal carotid artery stenosis, which Dr Allyson Sabal stented on April 13, 2011, under the SAPPHIRE protocol. Followup Dopplers performed Aug 2013 revealed it to be widely patent. She has cutaneous T-cell lymphoma followed by Dr Cyndie Chime. She had a Rt AKA in the past. She has PAF and had a stroke in March 2013. She is on chronic anticoagulation with Pradaxa. Echo cardiograms have shown NL LVF and diastolic dysfunction with moderate to severe LAE.            She has recently admitted to [facility] with acute on chronic diastolic CHF. Her BNP on admission was [phone]- [phone] at discharge. She had had an URI the week prior to that admission which may have contributed to he decompensation. She was noted to be in atrial fibrillation with variable VR on admission. Her Coreg was cut back. Today in the office she is in NSR on exam. She has done well since discharge. She is on a sliding scale Lasix dose at [facility] based on her weight.    Current Outpatient Prescriptions  Medication Sig Dispense Refill  . atorvastatin (LIPITOR) 40 MG tablet Take 1 tablet (40 mg total) by mouth daily at 6 PM.      . bexarotene (TARGRETIN) 75 MG CAPS capsule Take 150 mg by mouth daily before supper. Give with food. Protect from light. CAUTION: Chemotherapy/Biotherapy-Wear gloves when handling      . carvedilol (COREG) 12.5 MG tablet Take 1 tablet  (12.5 mg total) by mouth 2 (two) times daily with a meal.  60 tablet  0  . dabigatran (PRADAXA) 150 MG CAPS Take 75 mg by mouth every 12 (twelve) hours.       Marland Kitchen diltiazem (CARDIZEM CD) 120 MG 24 hr capsule Take 120 mg by mouth daily. Take 1 tablet once daily for BP.      . famotidine (PEPCID) 20 MG tablet Take 40 mg by mouth daily.      . folic acid (FOLVITE) 1 MG tablet Take 1 mg by mouth daily.      . furosemide (LASIX) 40 MG tablet Take 1.5 tablets (60 mg total) by mouth 2 (two) times daily.  30 tablet  0  . gabapentin (NEURONTIN) 100 MG capsule Take 1 capsule (100 mg total) by mouth at bedtime.      Marland Kitchen guaiFENesin (MUCINEX) 600 MG 12 hr tablet Take by mouth 2 (two) times daily.      Marland Kitchen HYDROcodone-acetaminophen (NORCO/VICODIN) 5-325 MG per tablet Take 1 tablet by mouth every 6 (six) hours as needed for moderate pain or severe pain.  30 tablet  0  . ipratropium-albuterol (DUONEB) 0.5-2.5 (3) MG/3ML SOLN Take 3 mLs by nebulization.      . isosorbide mononitrate (IMDUR) 30 MG 24 hr tablet Take 1 tablet (30 mg total) by mouth daily.  30 tablet  6  . levocetirizine (XYZAL) 5 MG tablet Take 5  mg by mouth as needed for allergies.      Marland Kitchen lisinopril (PRINIVIL,ZESTRIL) 10 MG tablet Take 10 mg by mouth daily.      . nitroGLYCERIN (NITROSTAT) 0.4 MG SL tablet Place 0.4 mg under the tongue every 5 (five) minutes as needed for chest pain. If no relief call MD.      . Omega-3 Fatty Acids (FISH OIL) 1000 MG CAPS Take 1,000 mg by mouth at bedtime.       . polyethylene glycol (MIRALAX / GLYCOLAX) packet Take 17 g by mouth every other day. Take 1 packet mixed in 4 oz of beverage every day to relieve constipation.      . Polyvinyl Alcohol-Povidone (REFRESH OP) Apply 1 drop to eye 4 (four) times daily. Instill 1 drop to both eyes four times daily.*Wait 3-5 minutes between eye drops*      . saccharomyces boulardii (FLORASTOR) 250 MG capsule Take 250 mg by mouth 2 (two) times daily.      . vitamin C (ASCORBIC ACID) 500  MG tablet Take 500 mg by mouth daily.       No current facility-administered medications for this visit.    Allergies  Allergen Reactions  . Evista [Raloxifene]   . Penicillins Other (See Comments)    unknown  . Triamterene Other (See Comments)    unknown  . Alendronate Sodium Rash    Pt stated oh k to use biotene    History   Social History  . Marital Status: Widowed    Spouse Name: N/A    Number of Children: 2  . Years of Education: N/A   Occupational History  . Not on file.   Social History Main Topics  . Smoking status: Never Smoker   . Smokeless tobacco: Never Used  . Alcohol Use: No  . Drug Use: No  . Sexual Activity: No   Other Topics Concern  . Not on file   Social History Narrative  . No narrative on file     Review of Systems: General: negative for chills, fever, night sweats or weight changes.  Cardiovascular: negative for chest pain, dyspnea on exertion, edema, orthopnea, palpitations, paroxysmal nocturnal dyspnea or shortness of breath Dermatological: negative for rash Respiratory: negative for cough or wheezing Urologic: negative for hematuria Abdominal: negative for nausea, vomiting, diarrhea, bright red blood per rectum, melena, or hematemesis Neurologic: negative for visual changes, syncope, or dizziness All other systems reviewed and are otherwise negative except as noted above.    Blood pressure 170/70, pulse 64, height 5' 6.5" (1.689 m), weight 132 lb (59.875 kg).  General appearance: alert, cooperative and no distress Lungs: few basilar crackles Heart: regular rate and rhythm and 2/6 systolic murmur Extremities: Rt AKA Skin: diffuse errythema   ASSESSMENT AND PLAN:   Acute on chronic diastolic CHF (congestive heart failure) Discharged 08/30/13 after CHF exacerbation.  CAD- 3V March 2012- medical Rx No angina  Chronic diastolic HF (heart failure) Echo 08/26/13- Nl LVF with grade 2 diastolic dysfunction  HTN  (hypertension) B/P a little high in the office today, consider increasing ACE if systolic B/P is consistently > 150.  PAF (paroxysmal atrial fibrillation) She was in NSR today on exam.  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   PLAN: Ms Diesing appears to be compensated today. I suspect her URI caused her decompensation which led to her  hospitalization. She is in NSR on exam and I did not change her medications. She may need further titrations of her ACE if her B/P remains elevated, but will defer this to her MD at [facility].  She will see Dr Allyson Sabal in 3 months.  Toribio Seiber KPA-C 09/12/2013 10:38 AM

## 2013-09-12 NOTE — Assessment & Plan Note (Signed)
Discharged 08/30/13 after CHF exacerbation.

## 2013-09-12 NOTE — Assessment & Plan Note (Signed)
She was in NSR today on exam.

## 2013-09-12 NOTE — Assessment & Plan Note (Signed)
Followed by Dr Cyndie Chime

## 2013-09-12 NOTE — Assessment & Plan Note (Signed)
Echo 08/26/13- Nl LVF with grade 2 diastolic dysfunction

## 2013-09-12 NOTE — Assessment & Plan Note (Signed)
B/P a little high in the office today, consider increasing ACE if systolic B/P is consistently > 150.

## 2013-09-12 NOTE — Assessment & Plan Note (Signed)
No angina 

## 2013-10-01 ENCOUNTER — Non-Acute Institutional Stay (SKILLED_NURSING_FACILITY): Payer: Medicare Other | Admitting: Nurse Practitioner

## 2013-10-01 DIAGNOSIS — I10 Essential (primary) hypertension: Secondary | ICD-10-CM

## 2013-10-01 DIAGNOSIS — C8404 Mycosis fungoides, lymph nodes of axilla and upper limb: Secondary | ICD-10-CM

## 2013-10-01 DIAGNOSIS — Z7901 Long term (current) use of anticoagulants: Secondary | ICD-10-CM

## 2013-10-01 DIAGNOSIS — I2510 Atherosclerotic heart disease of native coronary artery without angina pectoris: Secondary | ICD-10-CM

## 2013-10-01 DIAGNOSIS — K5900 Constipation, unspecified: Secondary | ICD-10-CM

## 2013-10-01 DIAGNOSIS — I509 Heart failure, unspecified: Secondary | ICD-10-CM

## 2013-10-01 DIAGNOSIS — K219 Gastro-esophageal reflux disease without esophagitis: Secondary | ICD-10-CM

## 2013-10-01 DIAGNOSIS — I5033 Acute on chronic diastolic (congestive) heart failure: Secondary | ICD-10-CM

## 2013-10-01 DIAGNOSIS — G546 Phantom limb syndrome with pain: Secondary | ICD-10-CM

## 2013-10-01 DIAGNOSIS — G547 Phantom limb syndrome without pain: Secondary | ICD-10-CM

## 2013-10-02 ENCOUNTER — Encounter: Payer: Self-pay | Admitting: Nurse Practitioner

## 2013-10-02 NOTE — Progress Notes (Signed)
Patient ID: Sara [name], female   DOB: July 26, 1919, 80 y.o.   MRN: [id number]   Code Status: DNR  Allergies  Allergen Reactions  . Evista [Raloxifene]   . Penicillins Other (See Comments)    unknown  . Triamterene Other (See Comments)    unknown  . Alendronate Sodium Rash    Pt stated oh k to use biotene    Chief Complaint  Patient presents with  . Medical Managment of Chronic Issues    HPI: Patient is a 80 y.o. female seen in the SNF at [facility] today for evaluation of recent hospital stay for chronic CHF  and other chronic medical conditions. Hospitalization from 08/27/2013-08/30/2013 for CHF-Furosemide was increased to 60mg  bid and decreased Carvedilol due to bradycardia. Had proBNP 2534, d-dimer 2.97, chest x-ray with central vascular congestion and bilateral pleural effusion.  CBC showed WBC 32.8. BMP was unremarkable.   Problem List Items Addressed This Visit   Acute on chronic diastolic CHF (congestive heart failure) - Primary     Daily weights. Continue lasix  60mg  bid and carvedilol 12.5mg  bid.  Hospital chest x-ray showed improvement in her pulmonary edema and effusions. Her proBNP trended down from 2534 to 802 while in hospital 08/2013       CAD- 3V March 2012- medical Rx (Chronic)     No angina since last visit, takes Isosorbide 30mg  daily and Atorvastatin 40mg  daily.     Chronic anticoagulation- Pradaxa (Chronic)      bradycardic to the 30s and 40s intermittently with occasional pauses up to and just greater than 2 seconds while in hospital. Resolved to 60s after Carvedilol was slightly reduced. Continued anticoagulation with pradaxa.       GERD (gastroesophageal reflux disease)     Stable, takes Famotidine 40mg  daily.     HTN (hypertension)      Controlled on Coreg, Cardizem DC,  Imdur,  and lisinopril for now.        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     Phantom limb  pain (Chronic)     Right AKA--managed with Gabapentin 100mg  nightly. Able to discontinue Hydrocodone/ApAp due to non used.                 Unspecified constipation     Managed with MiraLax qod                     Review of Systems:  Review of Systems  Constitutional: Negative for fever, chills, weight loss, malaise/fatigue and diaphoresis.  HENT: Positive for hearing loss. Negative for congestion, ear discharge, ear pain and sore throat.   Eyes: Negative for pain, discharge and redness.  Respiratory: Positive for cough. Negative for sputum production, shortness of breath and wheezing.   Cardiovascular: Positive for leg swelling. Negative for chest pain, palpitations, orthopnea, claudication and PND.       Trace  Gastrointestinal: Negative for heartburn, nausea, vomiting, abdominal pain, diarrhea, constipation and blood in stool.  Genitourinary: Positive for frequency (incontinent of bladder). Negative for dysuria, urgency, hematuria and flank pain.  Musculoskeletal: Positive for joint pain. Negative for back pain, falls, myalgias and neck pain.  Skin: Positive for itching and rash.       Chronic psoriasis, very thin and red skin, frequent skin abrasion or tear. Left wrist wart appearance skin lesion.   Neurological: Negative for dizziness, tingling, tremors, sensory change, speech change,  focal weakness, seizures, loss of consciousness, weakness (generalized) and headaches.  Endo/Heme/Allergies: Negative for environmental allergies and polydipsia. Bruises/bleeds easily.  Psychiatric/Behavioral: Positive for memory loss. Negative for depression and hallucinations. The patient is not nervous/anxious and does not have insomnia.      Past Medical History  Diagnosis Date  . Hypertension   . Atrial fibrillation   . Psoriasis   . Congestive heart failure (CHF)   . History of CVA (cerebrovascular accident)   . Coronary artery disease   . Myocardial infarction     . CHF (congestive heart failure)   . Shortness of breath   . H/O hiatal hernia   . Closed fracture of head of left humerus 5/12  . Basal cell carcinoma   . 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  Flow cytometry 04/04/12: 91% cells CD4 positive CD 26 negative  . Hyperlipemia   . Pneumonia   . Wound of right leg 09/25/2012    Necrotic, open wound right lower leg; non healing  . Stroke   . Hyposmolality and/or hyponatremia   . Lower limb amputation, above knee 10/11/2012  . Pain in joint, multiple sites   . Mycosis fungoides, unspecified site, extranodal and solid organ sites 09/28/2012  . Occlusion and stenosis of carotid artery without mention of cerebral infarction 09/28/2012  . Chronic venous hypertension with ulcer and inflammation 09/28/2012  . Diverticulosis of colon (without mention of hemorrhage) 09/28/2012  . Synovial cyst of popliteal space 03/26/2012  . Unspecified constipation 03/24/2012  . Hypopotassemia 01/26/2012  . Debility, unspecified 01/17/2012  . Anxiety state, unspecified 12/31/2011  . Anemia, unspecified 12/21/2011  . Other specified disease of white blood cells 12/21/2011  . Peripheral vascular disease, unspecified 12/21/2011    recent ABI Lt of 0.64 down from ).80 on 12/29/11- though different labs  . Reflux esophagitis 12/21/2011  . Unspecified hereditary and idiopathic peripheral neuropathy 12/14/2011  . Pain in joint, ankle and foot 12/14/2011  . Carotid stenosis     CAROTID DOPPLER, 05/02/2012 - LEFT VERTEBRAL-occluded, LEFT BULB AND PROXIMAL ICA STENT-moderate amount of irregular mixed dense plaque 50-69% diameter reduction  . Pre-syncope     NUCLEAR STRESS TEST, 12/10/2009 - normal, EKG negative for ischemia  . TIA (transient ischemic attack)     2D ECHO, 12/05/2012 - EF 60-65%, Severely calcified annulus of the mitral  valve with mild-moderate regurgitation, LA moderate-severely dilated  . Paroxysmal atrial fibrillation   . CAD in native artery     3 vessel CAD, medical therapy, not a candidate for CABG  . S/P AKA (above knee amputation) unilateral, Rt leg due to PAD and non healing wound 06/27/2013  . HCAP (healthcare-associated pneumonia) 01/08/2012   Past Surgical History  Procedure Laterality Date  . Tonsillectomy    . Dilation and curettage of uterus    . Eye surgery    . Cataracts    . Vertebroplasty    . Amputation  10/10/2012    Procedure: AMPUTATION ABOVE KNEE;  Surgeon: Newt Minion, MD;  Location: [place];  Service: Orthopedics;  Laterality: Right;  . Percutaneous stent intervention  04/13/2011    Left internal carotid artery stented with a 8x30 prcise nitinol self-expanding stent resulting reduction of 90% to less than 20% residual  . Cardiac catheterization  11/06/2010    CABG recommended, turned down by [facility] secondary to co  morbidities   Social History:   reports that she has never smoked. She has never used smokeless tobacco. She reports that she does not drink alcohol or use illicit drugs.  Family History  Problem Relation Age of Onset  . Stomach cancer Mother   . Pneumonia Father   . Cirrhosis Brother   . Diabetes Brother   . Heart attack Paternal Grandmother   . Heart failure Sister   . Diabetes Sister   . Heart failure Sister     Medications: Patient's Medications  New Prescriptions   No medications on file  Previous Medications   ATORVASTATIN (LIPITOR) 40 MG TABLET    Take 1 tablet (40 mg total) by mouth daily at 6 PM.   BEXAROTENE (TARGRETIN) 75 MG CAPS CAPSULE    Take 150 mg by mouth daily before supper. Give with food. Protect from light. CAUTION: Chemotherapy/Biotherapy-Wear gloves when handling   CARVEDILOL (COREG) 12.5 MG TABLET    Take 1 tablet (12.5 mg total) by mouth 2 (two) times daily with a meal.   DABIGATRAN (PRADAXA) 150 MG CAPS    Take 75 mg by mouth every  12 (twelve) hours.    DILTIAZEM (CARDIZEM CD) 120 MG 24 HR CAPSULE    Take 120 mg by mouth daily. Take 1 tablet once daily for BP.   FAMOTIDINE (PEPCID) 20 MG TABLET    Take 40 mg by mouth daily.   FOLIC ACID (FOLVITE) 1 MG TABLET    Take 1 mg by mouth daily.   FUROSEMIDE (LASIX) 40 MG TABLET    Take 1.5 tablets (60 mg total) by mouth 2 (two) times daily.   GABAPENTIN (NEURONTIN) 100 MG CAPSULE    Take 1 capsule (100 mg total) by mouth at bedtime.   GUAIFENESIN (MUCINEX) 600 MG 12 HR TABLET    Take by mouth 2 (two) times daily.   IPRATROPIUM-ALBUTEROL (DUONEB) 0.5-2.5 (3) MG/3ML SOLN    Take 3 mLs by nebulization.   ISOSORBIDE MONONITRATE (IMDUR) 30 MG 24 HR TABLET    Take 1 tablet (30 mg total) by mouth daily.   LEVOCETIRIZINE (XYZAL) 5 MG TABLET    Take 5 mg by mouth as needed for allergies.   LISINOPRIL (PRINIVIL,ZESTRIL) 10 MG TABLET    Take 10 mg by mouth daily.   NITROGLYCERIN (NITROSTAT) 0.4 MG SL TABLET    Place 0.4 mg under the tongue every 5 (five) minutes as needed for chest pain. If no relief call MD.   OMEGA-3 FATTY ACIDS (FISH OIL) 1000 MG CAPS    Take 1,000 mg by mouth at bedtime.    POLYETHYLENE GLYCOL (MIRALAX / GLYCOLAX) PACKET    Take 17 g by mouth every other day. Take 1 packet mixed in 4 oz of beverage every day to relieve constipation.   POLYVINYL ALCOHOL-POVIDONE (REFRESH OP)    Apply 1 drop to eye 4 (four) times daily. Instill 1 drop to both eyes four times daily.*Wait 3-5 minutes between eye drops*   SACCHAROMYCES BOULARDII (FLORASTOR) 250 MG CAPSULE    Take 250 mg by mouth 2 (two) times daily.   VITAMIN C (ASCORBIC ACID) 500 MG TABLET    Take 500 mg by mouth daily.  Modified Medications   No medications on file  Discontinued Medications   HYDROCODONE-ACETAMINOPHEN (NORCO/VICODIN) 5-325 MG PER TABLET    Take 1 tablet by mouth every 6 (six) hours as needed for moderate pain or severe pain.     Physical Exam: Physical Exam  Constitutional: She  is oriented to person,  place, and time. She appears well-developed and well-nourished. No distress.  HENT:  Head: Normocephalic.  Right Ear: External ear normal.  Left Ear: External ear normal.  Nose: Nose normal.  Mouth/Throat: Oropharynx is clear and moist. No oropharyngeal exudate.  Eyes: EOM are normal. Pupils are equal, round, and reactive to light. Right conjunctiva is injected.  Only a few eyelashes remained and right conjunctiva mild irritated with bilateral lower eyelid eversion.   Neck: Normal range of motion. Neck supple. JVD present. No thyromegaly present.  Cardiovascular: Normal rate, regular rhythm and normal heart sounds.  Exam reveals decreased pulses.   No murmur heard. Pulses:      Dorsalis pedis pulses are 1+ on the left side.       Posterior tibial pulses are 1+ on the left side.  Pulmonary/Chest: Effort normal and breath sounds normal. No respiratory distress. She has no wheezes. She exhibits no tenderness.  Decreased breath sounds posterior mid lungs.   Abdominal: Soft. Bowel sounds are normal. She exhibits no distension. There is no tenderness.  Genitourinary:  Incontinent of bladder  Musculoskeletal: Normal range of motion. She exhibits edema. She exhibits no tenderness.  R AKA. Left lower leg edema appears new.   Lymphadenopathy:    She has no cervical adenopathy.  Neurological: She is alert and oriented to person, place, and time. She has normal reflexes. No cranial nerve deficit. She exhibits normal muscle tone. Coordination normal.  Skin: Rash noted. There is erythema.  Diffused erythematous scaly skin appearance. Psoriasis. Left wrist wart appearance skin lesion--f/u Dermatology.   Psychiatric: She has a normal mood and affect. Her behavior is normal. Judgment and thought content normal. She exhibits abnormal recent memory.    Filed Vitals:   10/01/13 1158  BP: 158/78  Pulse: 78  Temp: 97.7 F (36.5 C)  TempSrc: Tympanic  Resp: 18      Labs reviewed: Basic Metabolic  Panel:  Recent Labs  07/24/13 1415 08/02/13  08/27/13 1835 08/28/13 0445 08/29/13 0456 08/30/13 0453 09/11/13 1527 09/11/13 1527  NA 140 140  < >  --  137 139 134*  --  142  K 4.0 4.3  < >  --  3.7 3.7 4.0  --  3.8  CL  --   --   < >  --  97 98 94*  --   --   CO2 27  --   < >  --  31 29 29   --  25  GLUCOSE 136  --   < >  --  97 97 103*  --  119  BUN 12.7 13  < >  --  17 23 30*  --  12.3  CREATININE 0.7 0.6  < >  --  0.72 0.79 0.85  --  0.7  CALCIUM 9.6  --   < >  --  9.5 9.5 9.4  --  9.4  MG  --   --   --  2.0  --   --   --   --   --   TSH 0.781 0.99  --   --   --   --   --  1.009  --   < > = values in this interval not displayed. Liver Function Tests:  Recent Labs  07/24/13 1415 08/02/13 08/27/13 1150 09/11/13 1527  AST 16 1* 16 18  ALT 9 10 11 14   ALKPHOS 64 63 76 72  BILITOT 0.38  --  0.3 0.24  PROT 6.8  --  7.0 7.3  ALBUMIN 3.5  --  3.6 3.6    Recent Labs  07/24/13 1415 09/11/13 1529  LIPASE 20 29  AMYLASE 20 22   CBC:  Recent Labs  08/27/13 1015 08/28/13 0445 09/11/13 1527  WBC 32.8* 29.8* 33.0*  NEUTROABS 10.2* 6.3 7.5*  HGB 14.2 11.5* 13.2  HCT 42.0 35.5* 40.7  MCV 92.9 93.9 94.4  PLT 287 266 298   Lipid Panel:  Recent Labs  10/06/12 0648 11/02/12 0530 12/04/12 0820 12/14/12  07/24/13 1415 08/02/13 09/11/13 1529  CHOL 147 203* 218* 181  --   --   --   --   HDL 29* 30* 34* 30*  --   --   --   --   LDLCALC 92 138* 143* 106  --   --   --   --   TRIG 129 175* 207* 225*  < > 248* 188* 303*  CHOLHDL 5.1 6.8 6.4  --   --   --   --   --   < > = values in this interval not displayed.  Past Procedures:  Dg Chest Port 1 View  08/30/2013 CLINICAL DATA: History of CHF with worsening. EXAM: PORTABLE CHEST - 1 VIEW  IMPRESSION: The findings are consistent with the CHF with evidence of some mild improvement in the interstitial edema since the earlier study.   Dg Chest Portable 1 View  08/27/2013 CLINICAL DATA: Cough, congestion EXAM: PORTABLE  CHEST - 1 VIEW IMPRESSION: Central vascular congestion without convincing pulmonary edema. Bilateral small pleural effusion with bilateral basilar atelectasis or infiltrate.   Assessment/Plan Acute on chronic diastolic CHF (congestive heart failure) Daily weights. Continue lasix  60mg  bid and carvedilol 12.5mg  bid.  Hospital chest x-ray showed improvement in her pulmonary edema and effusions. Her proBNP trended down from 2534 to 802 while in hospital 08/2013     Unspecified constipation Managed with MiraLax qod                HTN (hypertension)  Controlled on Coreg, Cardizem DC,  Imdur,  and lisinopril for now.      CAD- 3V March 2012- medical Rx No angina since last visit, takes Isosorbide 30mg  daily and Atorvastatin 40mg  daily.   GERD (gastroesophageal reflux disease) Stable, takes Famotidine 40mg  daily.   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   Chronic anticoagulation- Pradaxa  bradycardic to the 30s and 40s intermittently with occasional pauses up to and just greater than 2 seconds while in hospital. Resolved to 60s after Carvedilol was slightly reduced. Continued anticoagulation with pradaxa.     Phantom limb pain Right AKA--managed with Gabapentin 100mg  nightly. Able to discontinue Hydrocodone/ApAp due to non used.                 Family/ Staff Communication: observe the patient  Goals of Care: SNF  Labs/tests ordered: none

## 2013-10-03 ENCOUNTER — Encounter: Payer: Self-pay | Admitting: Nurse Practitioner

## 2013-10-03 DIAGNOSIS — K219 Gastro-esophageal reflux disease without esophagitis: Secondary | ICD-10-CM | POA: Insufficient documentation

## 2013-10-03 NOTE — Assessment & Plan Note (Signed)
bradycardic to the 30s and 40s intermittently with occasional pauses up to and just greater than 2 seconds while in hospital. Resolved to 60s after Carvedilol was slightly reduced. Continued anticoagulation with pradaxa.   

## 2013-10-03 NOTE — Assessment & Plan Note (Signed)
Controlled on Coreg, Cardizem DC,  Imdur,  and lisinopril for now.

## 2013-10-03 NOTE — Assessment & Plan Note (Signed)
Stable, takes Famotidine 40mg daily.    

## 2013-10-03 NOTE — Assessment & Plan Note (Signed)
No angina since last visit, takes Isosorbide 30mg  daily and Atorvastatin 40mg  daily.

## 2013-10-03 NOTE — Assessment & Plan Note (Signed)
Daily weights. Continue lasix  60mg  bid and carvedilol 12.5mg  bid.  Hospital chest x-ray showed improvement in her pulmonary edema and effusions. Her proBNP trended down from 2534 to 802 while in hospital 08/2013

## 2013-10-03 NOTE — Assessment & Plan Note (Signed)
Managed with MiraLax qod   

## 2013-10-03 NOTE — Assessment & Plan Note (Signed)
Presently taking Targretin per Oncology.

## 2013-10-03 NOTE — Assessment & Plan Note (Signed)
Right AKA--managed with Gabapentin 100mg nightly. Able to discontinue Hydrocodone/ApAp due to non used.    

## 2013-10-09 ENCOUNTER — Other Ambulatory Visit: Payer: Self-pay | Admitting: *Deleted

## 2013-10-09 NOTE — Telephone Encounter (Signed)
THIS REFILL REQUEST FOR TARGRETIN WAS PLACED IN DR.GRANFORTUNA'S ACTIVE WORK FOLDER.

## 2013-10-10 ENCOUNTER — Other Ambulatory Visit: Payer: Self-pay

## 2013-10-10 ENCOUNTER — Telehealth: Payer: Self-pay

## 2013-10-10 DIAGNOSIS — C8404 Mycosis fungoides, lymph nodes of axilla and upper limb: Secondary | ICD-10-CM

## 2013-10-10 MED ORDER — BEXAROTENE 75 MG PO CAPS: 150.0000 mg | ORAL_CAPSULE | Freq: Every day | ORAL | Status: DC

## 2013-10-10 NOTE — Telephone Encounter (Signed)
Rx faxed to [facility].

## 2013-10-10 NOTE — Telephone Encounter (Signed)
refaxed targretin Rx to another # per request from  Mindi Slicker at [facility]. Original fax [phone] on their refil request form. Second fax [phone] from Ms. Wayne Both

## 2013-10-15 ENCOUNTER — Encounter: Payer: Self-pay | Admitting: Oncology

## 2013-10-15 NOTE — Progress Notes (Signed)
Received letter from Patient [employer].  Pt is approved for Targretin from 10/11/13 to 10/10/14 or when benefit cap has been met.  Expenses can be submitted for reimbursement for dos from 07/13/13 to 10/10/14.  The amount of the grant is $7500.

## 2013-10-23 ENCOUNTER — Ambulatory Visit (HOSPITAL_BASED_OUTPATIENT_CLINIC_OR_DEPARTMENT_OTHER): Payer: Medicare Other | Admitting: Oncology

## 2013-10-23 ENCOUNTER — Other Ambulatory Visit (HOSPITAL_BASED_OUTPATIENT_CLINIC_OR_DEPARTMENT_OTHER): Payer: Medicare Other

## 2013-10-23 ENCOUNTER — Telehealth: Payer: Self-pay | Admitting: Hematology and Oncology

## 2013-10-23 VITALS — BP 166/63 | HR 62 | Temp 97.8°F | Resp 17

## 2013-10-23 DIAGNOSIS — D72829 Elevated white blood cell count, unspecified: Secondary | ICD-10-CM

## 2013-10-23 DIAGNOSIS — D72820 Lymphocytosis (symptomatic): Secondary | ICD-10-CM

## 2013-10-23 DIAGNOSIS — E785 Hyperlipidemia, unspecified: Secondary | ICD-10-CM

## 2013-10-23 DIAGNOSIS — E039 Hypothyroidism, unspecified: Secondary | ICD-10-CM

## 2013-10-23 DIAGNOSIS — C84A0 Cutaneous T-cell lymphoma, unspecified, unspecified site: Secondary | ICD-10-CM

## 2013-10-23 DIAGNOSIS — M869 Osteomyelitis, unspecified: Secondary | ICD-10-CM

## 2013-10-23 DIAGNOSIS — L408 Other psoriasis: Secondary | ICD-10-CM

## 2013-10-23 DIAGNOSIS — C8404 Mycosis fungoides, lymph nodes of axilla and upper limb: Secondary | ICD-10-CM

## 2013-10-23 LAB — CBC WITH DIFFERENTIAL/PLATELET
BASO%: 0.3 % (ref 0.0–2.0)
Basophils Absolute: 0.1 10*3/uL (ref 0.0–0.1)
EOS ABS: 0.4 10*3/uL (ref 0.0–0.5)
EOS%: 1.6 % (ref 0.0–7.0)
HEMATOCRIT: 41.7 % (ref 34.8–46.6)
HGB: 13.6 g/dL (ref 11.6–15.9)
LYMPH%: 73.6 % — ABNORMAL HIGH (ref 14.0–49.7)
MCH: 30.2 pg (ref 25.1–34.0)
MCHC: 32.5 g/dL (ref 31.5–36.0)
MCV: 93 fL (ref 79.5–101.0)
MONO#: 1.1 10*3/uL — AB (ref 0.1–0.9)
MONO%: 4 % (ref 0.0–14.0)
NEUT#: 5.8 10*3/uL (ref 1.5–6.5)
NEUT%: 20.5 % — ABNORMAL LOW (ref 38.4–76.8)
Platelets: 239 10*3/uL (ref 145–400)
RBC: 4.49 10*6/uL (ref 3.70–5.45)
RDW: 13.6 % (ref 11.2–14.5)
WBC: 28.2 10*3/uL — AB (ref 3.9–10.3)
lymph#: 20.8 10*3/uL — ABNORMAL HIGH (ref 0.9–3.3)

## 2013-10-23 LAB — COMPREHENSIVE METABOLIC PANEL (CC13)
ALBUMIN: 3.7 g/dL (ref 3.5–5.0)
ALK PHOS: 74 U/L (ref 40–150)
ALT: 11 U/L (ref 0–55)
AST: 20 U/L (ref 5–34)
Anion Gap: 10 mEq/L (ref 3–11)
BUN: 13.6 mg/dL (ref 7.0–26.0)
CO2: 33 mEq/L — ABNORMAL HIGH (ref 22–29)
Calcium: 9.8 mg/dL (ref 8.4–10.4)
Chloride: 99 mEq/L (ref 98–109)
Creatinine: 0.8 mg/dL (ref 0.6–1.1)
GLUCOSE: 102 mg/dL (ref 70–140)
Potassium: 4 mEq/L (ref 3.5–5.1)
Sodium: 142 mEq/L (ref 136–145)
Total Bilirubin: 0.31 mg/dL (ref 0.20–1.20)
Total Protein: 7.1 g/dL (ref 6.4–8.3)

## 2013-10-23 LAB — LACTATE DEHYDROGENASE (CC13): LDH: 128 U/L (ref 125–245)

## 2013-10-23 LAB — TECHNOLOGIST REVIEW

## 2013-10-23 NOTE — Progress Notes (Signed)
Hematology and Oncology Follow Up Visit  LARETTA MCANELLY QF:[id number] Jan 06, 1919 80 y.o. 10/23/2013 3:42 PM   Principle Diagnosis: Encounter Diagnoses  Name Primary?  . 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 Hypothyroid   . Hyperlipidemia      Interim History:    Followup visit for this pleasant 80 year old woman with mycosis fungoides.  It has now been one and a half years since diagnosis in July 2013. Some initial difficulty in establishing the diagnosis in view of a history of chronic psoriasis. I initially referred her to Dr. Lelon Huh a oncologic dermatologist at [facility] with a special interest in cutaneous T-cell lymphomas. She recommended a trial of PUVA plus interferon. The patient's medical condition was unstable at that time and after discussing the potential side effects of this regimen she elected not to go on the interferon-based treatment. A trial of Targretin was started in January 2014. She had a number of major medical problems over the last year detailed in my progress note dated 12/19/2012 most dramatic of which was necessity for a right above the knee amputation for osteomyelitis. This essentially day derailed any consideration for PUVA treatments from a practical point of view. She had to move to a skilled care facility.  The Targretin was initially started a dose of 75 mg daily. Dose was escalated at the end of March, 2014,  to 150 mg. She has tolerated the drug well. We have monitored renal function, liver function, thyroid function and lipid profile as well as her CBC. She has a persistent leukocytosis with absolute lymphocytosis but total white count has remained stable. Hemoglobin has remained stable. Platelet count has remained normal. She has  adenopathy  limited to her axillae. No central adenopathy or splenomegaly on CT scans.   At time of my 03/20/2013 visit I felt that  she was showing a moderate response to the drug. There was a significant decrease in erythema and desquamation. At time of her visit on August 26, multiple new papules noted on the skin of her trunk.  Due to her age and now handicapped status, she did not want to go to [facility] for an evaluation for electron beam radiation therapy. She was therefore evaluated here in [facility] by Dr. Arloa Koh. He didn't think that the chest papules were significant enough to initiate a course of radiation. He will continue to follow the patient and will see her again in a few months.  At time of her visit here on December 30, she had just been discharged from the hospital with an episode of heart failure. Her skin condition had worsened. I was hoping that this was just a transient decompensation. I elected to continue her Targretin. Skin does not look much better today which will be detailed in my exam below.            Medications: reviewed  Allergies:  Allergies  Allergen Reactions  . Evista [Raloxifene]   . Penicillins Other (See Comments)    unknown  . Triamterene Other (See Comments)    unknown  . Alendronate Sodium Rash    Pt stated oh k to use biotene    Review of Systems: Hematology:  No bleeding or bruising ENT ROS: No sore throat Breast ROS:  Respiratory ROS: No cough or dyspnea Cardiovascular ROS:  No chest pain or palpitation Gastrointestinal ROS: No abdominal pain or change in bowel habit  Genito-Urinary ROS: Not questioned Musculoskeletal ROS: No muscle bone or joint pain Neurological ROS: No headache or change in vision Dermatological ROS: Recent flareup of chronic skin condition Remaining ROS negative:   Physical Exam: Blood pressure 166/63, pulse 62, temperature 97.8 F (36.6 C), temperature source Oral, resp. rate 17, weight 0 lb (0 kg), SpO2 97.00%. Wt Readings from Last 3 Encounters:  09/12/13 132 lb (59.875 kg)  08/30/13 138 lb 10.7 oz (62.9 kg)  08/16/13 135 lb  4.8 oz (61.372 kg)     General appearance: Frail elderly Caucasian woman HENNT: Pharynx no erythema, exudate, mass, or ulcer. No thyromegaly or thyroid nodules Lymph nodes: Bulky 4-5 centimeter right axillary lymph node more prominent than on previous exam. Approximate 2 cm left axillary node. No cervical or supraclavicular adenopathy Breasts:  Lungs: Clear to auscultation, resonant to percussion throughout Heart: Regular rhythm, no murmur, no gallop, no rub, no click, no edema Abdomen: Soft, nontender, normal bowel sounds, no mass, no organomegaly Extremities: Right above the knee amputation. Trace left lower extremity edema. No calf tenderness. Musculoskeletal: no joint deformities GU:  Vascular: Carotid pulses 2+, no bruits Neurologic: Alert, oriented, PERRLA,  , cranial nerves grossly normal, motor strength 5 over 5, reflexes 1+ symmetric, upper body coordination normal, gait normal, Skin: Diffuse erythematous and desquamative rash worse on the hands, lower back, and abdomen with some sparing of the upper back.  Lab Results: CBC W/Diff  White count differential: 21% neutrophils, 74% lymphocytes, 4% monocytes   Component Value Date/Time   WBC 28.2* 10/23/2013 1406   WBC 29.8* 08/28/2013 0445   WBC 27.0 08/02/2013   RBC 4.49 10/23/2013 1406   RBC 3.78* 08/28/2013 0445   RBC 3.54* 12/08/2011 0605   HGB 13.6 10/23/2013 1406   HGB 11.5* 08/28/2013 0445   HCT 41.7 10/23/2013 1406   HCT 35.5* 08/28/2013 0445   PLT 239 10/23/2013 1406   PLT 266 08/28/2013 0445   MCV 93.0 10/23/2013 1406   MCV 93.9 08/28/2013 0445   MCH 30.2 10/23/2013 1406   MCH 30.4 08/28/2013 0445   MCHC 32.5 10/23/2013 1406   MCHC 32.4 08/28/2013 0445   RDW 13.6 10/23/2013 1406   RDW 13.2 08/28/2013 0445   LYMPHSABS 20.8* 10/23/2013 1406   LYMPHSABS 22.0* 08/28/2013 0445   MONOABS 1.1* 10/23/2013 1406   MONOABS 0.9 08/28/2013 0445   EOSABS 0.4 10/23/2013 1406   EOSABS 0.6 08/28/2013 0445   BASOSABS 0.1 10/23/2013 1406    BASOSABS 0.0 08/28/2013 0445     Chemistry      Component Value Date/Time   NA 142 10/23/2013 1406   NA 134* 08/30/2013 0453   NA 140 08/02/2013   K 4.0 10/23/2013 1406   K 4.0 08/30/2013 0453   CL 94* 08/30/2013 0453   CL 102 09/25/2012 1002   CO2 33* 10/23/2013 1406   CO2 29 08/30/2013 0453   BUN 13.6 10/23/2013 1406   BUN 30* 08/30/2013 0453   BUN 13 08/02/2013   CREATININE 0.8 10/23/2013 1406   CREATININE 0.85 08/30/2013 0453   CREATININE 0.6 08/02/2013   CREATININE 0.56 01/23/2013 0720   GLU 94 08/02/2013      Component Value Date/Time   CALCIUM 9.8 10/23/2013 1406   CALCIUM 9.4 08/30/2013 0453   ALKPHOS 74 10/23/2013 1406   ALKPHOS 76 08/27/2013 1150   AST 20 10/23/2013 1406   AST 16 08/27/2013 1150   ALT 11 10/23/2013 1406   ALT 11 08/27/2013 1150   BILITOT 0.31 10/23/2013  1406   BILITOT 0.3 08/27/2013 1150      Impression:  #1. Mycosis fungoides/cutaneous T-cell lymphoma dx 7/13 Some deterioration in her rash over the last 2 months. More prominent right axillary lymphadenopathy. Stable CBC with chronic leukocytosis/lymphocytosis but no anemia or thrombocytopenia. She may be breaking through the moderate control that we have been seeing with the Targretin. We talked about alternative options along with her daughter who accompanies her today. Practically speaking at age 65 with a handicap(amputation of the right lower extremity) aggressive therapy with  HDAC inhibitors such as Romidepsin or bellinostatin or anti-fols such as Pralatrexate are not practical secondary to potential toxicity, inconvenience, and expense. The only reasonable alternative treatment would be reconsideration of either UV light therapy or electron beam radiotherapy.  Since I am going to transition her care to another oncologist, I'm going to continue the Targretin until the time of her next followup visit here in 8 weeks.  #2. Long-standing psoriasis.   #3. Nonhealing wound right lateral malleolus  with development of osteomyelitis requiring  right AKA 1/14.   #4. Chronic atrial fibrillation with history of embolic stroke 1/97.   #5. Hypertension.   #6. Coronary artery disease with previous MI;   #7. chronic congestive heart failure with recent acute exacerbation 12/14.     CC: Patient Care Team: Estill Dooms, MD as PCP - General (Internal Medicine) Devra Dopp as Referring Physician (Dermatology) Renaldo Harrison as Consulting Physician (Dermatology) Rexene Edison, MD as Consulting Physician (Radiation Oncology) Annia Belt, MD as Consulting Physician (Oncology)   Annia Belt, MD 2/10/20153:42 PM

## 2013-10-23 NOTE — Telephone Encounter (Signed)
gv and printed aptp sched and avs for pt for April.Sara KitchenMarland Kitchenpt wanted friday appt

## 2013-11-05 ENCOUNTER — Non-Acute Institutional Stay (SKILLED_NURSING_FACILITY): Payer: Medicare Other | Admitting: Nurse Practitioner

## 2013-11-05 ENCOUNTER — Encounter: Payer: Self-pay | Admitting: Nurse Practitioner

## 2013-11-05 DIAGNOSIS — I5032 Chronic diastolic (congestive) heart failure: Secondary | ICD-10-CM

## 2013-11-05 DIAGNOSIS — K5900 Constipation, unspecified: Secondary | ICD-10-CM

## 2013-11-05 DIAGNOSIS — D649 Anemia, unspecified: Secondary | ICD-10-CM

## 2013-11-05 DIAGNOSIS — G546 Phantom limb syndrome with pain: Secondary | ICD-10-CM

## 2013-11-05 DIAGNOSIS — I2510 Atherosclerotic heart disease of native coronary artery without angina pectoris: Secondary | ICD-10-CM

## 2013-11-05 DIAGNOSIS — G547 Phantom limb syndrome without pain: Secondary | ICD-10-CM

## 2013-11-05 DIAGNOSIS — I10 Essential (primary) hypertension: Secondary | ICD-10-CM

## 2013-11-05 DIAGNOSIS — K219 Gastro-esophageal reflux disease without esophagitis: Secondary | ICD-10-CM

## 2013-11-05 DIAGNOSIS — I480 Paroxysmal atrial fibrillation: Secondary | ICD-10-CM

## 2013-11-05 DIAGNOSIS — I4891 Unspecified atrial fibrillation: Secondary | ICD-10-CM

## 2013-11-05 NOTE — Progress Notes (Signed)
Patient ID: Sara [name], female   DOB: Jul 17, 1919, 80 y.o.   MRN: [id number]   Code Status: DNR  Allergies  Allergen Reactions  . Evista [Raloxifene]   . Penicillins Other (See Comments)    unknown  . Triamterene Other (See Comments)    unknown  . Alendronate Sodium Rash    Pt stated oh k to use biotene    Chief Complaint  Patient presents with  . Medical Managment of Chronic Issues    HPI: Patient is a 80 y.o. female seen in the SNF at [facility] today for evaluation of chronic medical conditions.   Hospitalization from 08/27/2013-08/30/2013 for CHF-Furosemide was increased to 60mg  bid and decreased Carvedilol due to bradycardia. Had proBNP 2534, d-dimer 2.97, chest x-ray with central vascular congestion and bilateral pleural effusion.  CBC showed WBC 32.8. BMP was unremarkable.   Problem List Items Addressed This Visit   Unspecified constipation     Managed with MiraLax qod     Phantom limb pain (Chronic)     Right AKA--managed with Gabapentin 100mg  nightly. Able to discontinue Hydrocodone/ApAp due to non used.      PAF (paroxysmal atrial fibrillation)     bradycardic to the 30s and 40s intermittently with occasional pauses up to and just greater than 2 seconds while in hospital. Resolved to 60s after Carvedilol was slightly reduced. Continued anticoagulation with pradaxa.      HTN (hypertension) - Primary     Controlled on Coreg, Cardizem DC,  Imdur,  and lisinopril for now.     GERD (gastroesophageal reflux disease)     Stable, takes Famotidine 40mg  daily.      Chronic diastolic HF (heart failure) (Chronic)     aily weights. Increased lasix to 60mg  bid and decreased carvedilol slightly due to bradycardia. Hospital chest x-ray showed improvement in her pulmonary edema and effusions. Her proBNP trended down from 2534 to 802.      CAD- 3V March 2012- medical Rx (Chronic)     No angina since last visit, takes Isosorbide 30mg  daily and Atorvastatin 40mg   daily.     Anemia     Resolved, Hgb 13.9 08/02/13        Review of Systems:  Review of Systems  Constitutional: Negative for fever, chills, weight loss, malaise/fatigue and diaphoresis.  HENT: Positive for hearing loss. Negative for congestion, ear discharge, ear pain and sore throat.   Eyes: Negative for pain, discharge and redness.  Respiratory: Positive for cough. Negative for sputum production, shortness of breath and wheezing.   Cardiovascular: Positive for leg swelling. Negative for chest pain, palpitations, orthopnea, claudication and PND.       Slightly worse 1+  Gastrointestinal: Negative for heartburn, nausea, vomiting, abdominal pain, diarrhea, constipation and blood in stool.  Genitourinary: Positive for frequency (incontinent of bladder). Negative for dysuria, urgency, hematuria and flank pain.  Musculoskeletal: Positive for joint pain. Negative for back pain, falls, myalgias and neck pain.  Skin: Positive for itching and rash.       Chronic psoriasis, very thin and red skin, frequent skin abrasion or tear. Left wrist wart appearance skin lesion.   Neurological: Negative for dizziness, tingling, tremors, sensory change, speech change, focal weakness, seizures, loss of consciousness, weakness (generalized) and headaches.  Endo/Heme/Allergies: Negative for environmental allergies and polydipsia. Bruises/bleeds easily.  Psychiatric/Behavioral: Positive for memory loss. Negative for depression and hallucinations. The patient is not nervous/anxious and does not have insomnia.      Past Medical History  Diagnosis Date  . Hypertension   . Atrial fibrillation   . Psoriasis   . Congestive heart failure (CHF)   . History of CVA (cerebrovascular accident)   . Coronary artery disease   . Myocardial infarction   . CHF (congestive heart failure)   . Shortness of breath   . H/O hiatal hernia   . Closed fracture of head of left humerus 5/12  . Basal cell carcinoma   . Mycosis  fungoides involving lymph nodes of axilla and upper limb 05/29/2012    Diffuse cutaneous rash; desquamation skin palms & soles; WBC 15,000 50% lymphs; Hb 12' plattlets 244,000.  Flow cytometry 04/04/12: 91% cells CD4 positive CD 26 negative  . Hyperlipemia   . Pneumonia   . Wound of right leg 09/25/2012    Necrotic, open wound right lower leg; non healing  . Stroke   . Hyposmolality and/or hyponatremia   . Lower limb amputation, above knee 10/11/2012  . Pain in joint, multiple sites   . Mycosis fungoides, unspecified site, extranodal and solid organ sites 09/28/2012  . Occlusion and stenosis of carotid artery without mention of cerebral infarction 09/28/2012  . Chronic venous hypertension with ulcer and inflammation 09/28/2012  . Diverticulosis of colon (without mention of hemorrhage) 09/28/2012  . Synovial cyst of popliteal space 03/26/2012  . Unspecified constipation 03/24/2012  . Hypopotassemia 01/26/2012  . Debility, unspecified 01/17/2012  . Anxiety state, unspecified 12/31/2011  . Anemia, unspecified 12/21/2011  . Other specified disease of white blood cells 12/21/2011  . Peripheral vascular disease, unspecified 12/21/2011    recent ABI Lt of 0.64 down from ).80 on 12/29/11- though different labs  . Reflux esophagitis 12/21/2011  . Unspecified hereditary and idiopathic peripheral neuropathy 12/14/2011  . Pain in joint, ankle and foot 12/14/2011  . Carotid stenosis     CAROTID DOPPLER, 05/02/2012 - LEFT VERTEBRAL-occluded, LEFT BULB AND PROXIMAL ICA STENT-moderate amount of irregular mixed dense plaque 50-69% diameter reduction  . Pre-syncope     NUCLEAR STRESS TEST, 12/10/2009 - normal, EKG negative for ischemia  . TIA (transient ischemic attack)     2D ECHO, 12/05/2012 - EF 60-65%, Severely calcified annulus of the mitral valve with mild-moderate regurgitation, LA moderate-severely dilated  . Paroxysmal atrial fibrillation   . CAD in native artery     3 vessel CAD, medical therapy, not a  candidate for CABG  . S/P AKA (above knee amputation) unilateral, Rt leg due to PAD and non healing wound 06/27/2013  . HCAP (healthcare-associated pneumonia) 01/08/2012   Past Surgical History  Procedure Laterality Date  . Tonsillectomy    . Dilation and curettage of uterus    . Eye surgery    . Cataracts    . Vertebroplasty    . Amputation  10/10/2012    Procedure: AMPUTATION ABOVE KNEE;  Surgeon: Newt Minion, MD;  Location: [place];  Service: Orthopedics;  Laterality: Right;  . Percutaneous stent intervention  04/13/2011    Left internal carotid artery stented with a 8x30 prcise nitinol self-expanding stent resulting reduction of 90% to less than 20% residual  . Cardiac catheterization  11/06/2010    CABG recommended, turned down by [facility] secondary to co morbidities   Social History:   reports that she has never smoked. She has never used smokeless tobacco. She reports that she does not drink alcohol or use illicit drugs.  Family History  Problem Relation Age of Onset  . Stomach cancer Mother   . Pneumonia Father   .  Cirrhosis Brother   . Diabetes Brother   . Heart attack Paternal Grandmother   . Heart failure Sister   . Diabetes Sister   . Heart failure Sister     Medications: Patient's Medications  New Prescriptions   No medications on file  Previous Medications   ATORVASTATIN (LIPITOR) 40 MG TABLET    Take 1 tablet (40 mg total) by mouth daily at 6 PM.   BEXAROTENE (TARGRETIN) 75 MG CAPS CAPSULE    Take 2 capsules (150 mg total) by mouth daily before supper. Give with food. Protect from light. CAUTION: Chemotherapy/Biotherapy-Wear gloves when handling   CARVEDILOL (COREG) 12.5 MG TABLET    Take 1 tablet (12.5 mg total) by mouth 2 (two) times daily with a meal.   DABIGATRAN (PRADAXA) 150 MG CAPS    Take 75 mg by mouth every 12 (twelve) hours.    DILTIAZEM (CARDIZEM CD) 120 MG 24 HR CAPSULE    Take 120 mg by mouth daily. Take 1 tablet once daily for BP.   FAMOTIDINE (PEPCID)  20 MG TABLET    Take 40 mg by mouth daily.   FOLIC ACID (FOLVITE) 1 MG TABLET    Take 1 mg by mouth daily.   FUROSEMIDE (LASIX) 40 MG TABLET    Take 1.5 tablets (60 mg total) by mouth 2 (two) times daily.   GABAPENTIN (NEURONTIN) 100 MG CAPSULE    Take 1 capsule (100 mg total) by mouth at bedtime.   GUAIFENESIN (MUCINEX) 600 MG 12 HR TABLET    Take by mouth 2 (two) times daily.   IPRATROPIUM-ALBUTEROL (DUONEB) 0.5-2.5 (3) MG/3ML SOLN    Take 3 mLs by nebulization every 6 (six) hours as needed.    ISOSORBIDE MONONITRATE (IMDUR) 30 MG 24 HR TABLET    Take 1 tablet (30 mg total) by mouth daily.   LEVOCETIRIZINE (XYZAL) 5 MG TABLET    Take 5 mg by mouth as needed for allergies.   LISINOPRIL (PRINIVIL,ZESTRIL) 10 MG TABLET    Take 10 mg by mouth daily.   NITROGLYCERIN (NITROSTAT) 0.4 MG SL TABLET    Place 0.4 mg under the tongue every 5 (five) minutes as needed for chest pain. If no relief call MD.   OMEGA-3 FATTY ACIDS (FISH OIL) 1000 MG CAPS    Take 1,000 mg by mouth at bedtime.    POLYETHYLENE GLYCOL (MIRALAX / GLYCOLAX) PACKET    Take 17 g by mouth every other day. Take 1 packet mixed in 4 oz of beverage every day to relieve constipation.   POLYVINYL ALCOHOL-POVIDONE (REFRESH OP)    Apply 1 drop to eye 4 (four) times daily. Instill 1 drop to both eyes four times daily.*Wait 3-5 minutes between eye drops*   TRIAMCINOLONE CREAM (KENALOG) 0.1 %    Apply topically as directed.   VITAMIN C (ASCORBIC ACID) 500 MG TABLET    Take 500 mg by mouth daily.  Modified Medications   No medications on file  Discontinued Medications   No medications on file     Physical Exam: Physical Exam  Constitutional: She is oriented to person, place, and time. She appears well-developed and well-nourished. No distress.  HENT:  Head: Normocephalic.  Right Ear: External ear normal.  Left Ear: External ear normal.  Nose: Nose normal.  Mouth/Throat: Oropharynx is clear and moist. No oropharyngeal exudate.  Eyes: EOM  are normal. Pupils are equal, round, and reactive to light. Right conjunctiva is injected.  Only a few eyelashes remained and right  conjunctiva mild irritated with bilateral lower eyelid eversion.   Neck: Normal range of motion. Neck supple. JVD present. No thyromegaly present.  Cardiovascular: Normal rate, regular rhythm and normal heart sounds.  Exam reveals decreased pulses.   No murmur heard. Pulses:      Dorsalis pedis pulses are 1+ on the left side.       Posterior tibial pulses are 1+ on the left side.  Pulmonary/Chest: Effort normal and breath sounds normal. No respiratory distress. She has no wheezes. She exhibits no tenderness.  Decreased breath sounds posterior mid lungs.   Abdominal: Soft. Bowel sounds are normal. She exhibits no distension. There is no tenderness.  Genitourinary:  Incontinent of bladder  Musculoskeletal: Normal range of motion. She exhibits edema. She exhibits no tenderness.  R AKA. Left lower leg edema appears worse 1+  Lymphadenopathy:    She has no cervical adenopathy.  Neurological: She is alert and oriented to person, place, and time. She has normal reflexes. No cranial nerve deficit. She exhibits normal muscle tone. Coordination normal.  Skin: Rash noted. There is erythema.  Diffused erythematous scaly skin appearance. Psoriasis. Left wrist wart appearance skin lesion--f/u Dermatology.   Psychiatric: She has a normal mood and affect. Her behavior is normal. Judgment and thought content normal. She exhibits abnormal recent memory.    Filed Vitals:   11/05/13 1709  BP: 144/74  Pulse: 75  Temp: 98.8 F (37.1 C)  TempSrc: Tympanic  Resp: 18      Labs reviewed: Basic Metabolic Panel:  Recent Labs  07/24/13 1415 08/02/13  08/27/13 1835 08/28/13 0445 08/29/13 0456 08/30/13 0453 09/11/13 1527 09/11/13 1527 10/23/13 1406  NA 140 140  < >  --  137 139 134*  --  142 142  K 4.0 4.3  < >  --  3.7 3.7 4.0  --  3.8 4.0  CL  --   --   < >  --  97  98 94*  --   --   --   CO2 27  --   < >  --  31 29 29   --  25 33*  GLUCOSE 136  --   < >  --  97 97 103*  --  119 102  BUN 12.7 13  < >  --  17 23 30*  --  12.3 13.6  CREATININE 0.7 0.6  < >  --  0.72 0.79 0.85  --  0.7 0.8  CALCIUM 9.6  --   < >  --  9.5 9.5 9.4  --  9.4 9.8  MG  --   --   --  2.0  --   --   --   --   --   --   TSH 0.781 0.99  --   --   --   --   --  1.009  --   --   < > = values in this interval not displayed. Liver Function Tests:  Recent Labs  08/27/13 1150 09/11/13 1527 10/23/13 1406  AST 16 18 20   ALT 11 14 11   ALKPHOS 76 72 74  BILITOT 0.3 0.24 0.31  PROT 7.0 7.3 7.1  ALBUMIN 3.6 3.6 3.7    Recent Labs  07/24/13 1415 09/11/13 1529  LIPASE 20 29  AMYLASE 20 22   CBC:  Recent Labs  08/28/13 0445 09/11/13 1527 10/23/13 1406  WBC 29.8* 33.0* 28.2*  NEUTROABS 6.3 7.5* 5.8  HGB 11.5* 13.2 13.6  HCT  35.5* 40.7 41.7  MCV 93.9 94.4 93.0  PLT 266 298 239   Lipid Panel:  Recent Labs  12/04/12 0820 12/14/12  07/24/13 1415 08/02/13 09/11/13 1529  CHOL 218* 181  --   --   --   --   HDL 34* 30*  --   --   --   --   LDLCALC 143* 106  --   --   --   --   TRIG 207* 225*  < > 248* 188* 303*  CHOLHDL 6.4  --   --   --   --   --   < > = values in this interval not displayed.  Past Procedures:  Dg Chest Port 1 View  08/30/2013 CLINICAL DATA: History of CHF with worsening. EXAM: PORTABLE CHEST - 1 VIEW  IMPRESSION: The findings are consistent with the CHF with evidence of some mild improvement in the interstitial edema since the earlier study.   Dg Chest Portable 1 View  08/27/2013 CLINICAL DATA: Cough, congestion EXAM: PORTABLE CHEST - 1 VIEW IMPRESSION: Central vascular congestion without convincing pulmonary edema. Bilateral small pleural effusion with bilateral basilar atelectasis or infiltrate.   Assessment/Plan Unspecified constipation Managed with MiraLax qod   Phantom limb pain Right AKA--managed with Gabapentin 100mg  nightly. Able to  discontinue Hydrocodone/ApAp due to non used.    PAF (paroxysmal atrial fibrillation) bradycardic to the 30s and 40s intermittently with occasional pauses up to and just greater than 2 seconds while in hospital. Resolved to 60s after Carvedilol was slightly reduced. Continued anticoagulation with pradaxa.    HTN (hypertension) Controlled on Coreg, Cardizem DC,  Imdur,  and lisinopril for now.   GERD (gastroesophageal reflux disease) Stable, takes Famotidine 40mg  daily.    Chronic diastolic HF (heart failure) aily weights. Increased lasix to 60mg  bid and decreased carvedilol slightly due to bradycardia. Hospital chest x-ray showed improvement in her pulmonary edema and effusions. Her proBNP trended down from 2534 to 802.    Anemia Resolved, Hgb 13.9 08/02/13   CAD- 3V March 2012- medical Rx No angina since last visit, takes Isosorbide 30mg  daily and Atorvastatin 40mg  daily.     Family/ Staff Communication: observe the patient  Goals of Care: SNF  Labs/tests ordered: none

## 2013-11-08 NOTE — Assessment & Plan Note (Signed)
aily weights. Increased lasix to 60mg  bid and decreased carvedilol slightly due to bradycardia. Hospital chest x-ray showed improvement in her pulmonary edema and effusions. Her proBNP trended down from 2534 to 802.

## 2013-11-08 NOTE — Assessment & Plan Note (Signed)
Right AKA--managed with Gabapentin 100mg  nightly. Able to discontinue Hydrocodone/ApAp due to non used.

## 2013-11-08 NOTE — Assessment & Plan Note (Signed)
Resolved, Hgb 13.9 08/02/13

## 2013-11-08 NOTE — Assessment & Plan Note (Signed)
bradycardic to the 30s and 40s intermittently with occasional pauses up to and just greater than 2 seconds while in hospital. Resolved to 60s after Carvedilol was slightly reduced. Continued anticoagulation with pradaxa.

## 2013-11-08 NOTE — Assessment & Plan Note (Signed)
No angina since last visit, takes Isosorbide 30mg daily and Atorvastatin 40mg daily.  

## 2013-11-08 NOTE — Assessment & Plan Note (Signed)
Controlled on Coreg, Cardizem DC,  Imdur,  and lisinopril for now.   

## 2013-11-08 NOTE — Assessment & Plan Note (Signed)
Managed with MiraLax qod   

## 2013-11-08 NOTE — Assessment & Plan Note (Signed)
Stable, takes Famotidine 40mg daily.    

## 2013-11-10 ENCOUNTER — Encounter: Payer: Self-pay | Admitting: Oncology

## 2013-11-26 ENCOUNTER — Encounter: Payer: Self-pay | Admitting: Oncology

## 2013-11-26 NOTE — Progress Notes (Signed)
I. received a communication from Dr. Adelene Idler, [place] oncologist at [facility] re Sara [name]. She recommends a trial of oral chlorambucil 4-6 mg PO daily +/- prednisone 10 mg as a safe and effective treatment for this lady if we decide to stop the Targretin which she appears to be breaking through. She did not think total body electron beam RT would be of value given her age & handicaps. She is scheduled to see Dr. Alvy Bimler on April 17 and I'll leave this decision to her judgment.

## 2013-12-10 ENCOUNTER — Non-Acute Institutional Stay (SKILLED_NURSING_FACILITY): Payer: Medicare Other | Admitting: Nurse Practitioner

## 2013-12-10 DIAGNOSIS — D649 Anemia, unspecified: Secondary | ICD-10-CM

## 2013-12-10 DIAGNOSIS — G547 Phantom limb syndrome without pain: Secondary | ICD-10-CM

## 2013-12-10 DIAGNOSIS — I2510 Atherosclerotic heart disease of native coronary artery without angina pectoris: Secondary | ICD-10-CM

## 2013-12-10 DIAGNOSIS — I10 Essential (primary) hypertension: Secondary | ICD-10-CM

## 2013-12-10 DIAGNOSIS — Z7901 Long term (current) use of anticoagulants: Secondary | ICD-10-CM

## 2013-12-10 DIAGNOSIS — I4891 Unspecified atrial fibrillation: Secondary | ICD-10-CM

## 2013-12-10 DIAGNOSIS — I739 Peripheral vascular disease, unspecified: Secondary | ICD-10-CM

## 2013-12-10 DIAGNOSIS — I509 Heart failure, unspecified: Secondary | ICD-10-CM

## 2013-12-10 DIAGNOSIS — K5900 Constipation, unspecified: Secondary | ICD-10-CM

## 2013-12-10 DIAGNOSIS — K219 Gastro-esophageal reflux disease without esophagitis: Secondary | ICD-10-CM

## 2013-12-10 DIAGNOSIS — G546 Phantom limb syndrome with pain: Secondary | ICD-10-CM

## 2013-12-10 DIAGNOSIS — I480 Paroxysmal atrial fibrillation: Secondary | ICD-10-CM

## 2013-12-10 DIAGNOSIS — I5033 Acute on chronic diastolic (congestive) heart failure: Secondary | ICD-10-CM

## 2013-12-11 ENCOUNTER — Encounter: Payer: Self-pay | Admitting: Cardiovascular Disease

## 2013-12-11 ENCOUNTER — Ambulatory Visit (INDEPENDENT_AMBULATORY_CARE_PROVIDER_SITE_OTHER): Payer: Medicare Other | Admitting: Cardiovascular Disease

## 2013-12-11 ENCOUNTER — Encounter: Payer: Self-pay | Admitting: Nurse Practitioner

## 2013-12-11 VITALS — BP 156/80 | HR 68 | Ht 66.0 in | Wt 138.1 lb

## 2013-12-11 DIAGNOSIS — E785 Hyperlipidemia, unspecified: Secondary | ICD-10-CM

## 2013-12-11 DIAGNOSIS — I2510 Atherosclerotic heart disease of native coronary artery without angina pectoris: Secondary | ICD-10-CM

## 2013-12-11 DIAGNOSIS — I10 Essential (primary) hypertension: Secondary | ICD-10-CM

## 2013-12-11 DIAGNOSIS — I480 Paroxysmal atrial fibrillation: Secondary | ICD-10-CM

## 2013-12-11 DIAGNOSIS — I739 Peripheral vascular disease, unspecified: Secondary | ICD-10-CM

## 2013-12-11 DIAGNOSIS — I4891 Unspecified atrial fibrillation: Secondary | ICD-10-CM

## 2013-12-11 DIAGNOSIS — I6529 Occlusion and stenosis of unspecified carotid artery: Secondary | ICD-10-CM

## 2013-12-11 NOTE — Assessment & Plan Note (Signed)
Managed with MiraLax qod   

## 2013-12-11 NOTE — Assessment & Plan Note (Signed)
Daily weights. Continue lasix  60mg  bid and carvedilol 12.5mg  bid.  Hospital chest x-ray showed improvement in her pulmonary edema and effusions. Her proBNP trended down from 2534 to 802 while in hospital 08/2013. Compensated clinically.

## 2013-12-11 NOTE — Assessment & Plan Note (Signed)
F/u Cardiology 

## 2013-12-11 NOTE — Assessment & Plan Note (Signed)
On statin therapy followed by her PCP 

## 2013-12-11 NOTE — Assessment & Plan Note (Signed)
Stable, takes Famotidine 40mg daily.    

## 2013-12-11 NOTE — Progress Notes (Signed)
Patient ID: Sara [name], female   DOB: 06/06/1919, 80 y.o.   MRN: [id number]   Code Status: DNR  Allergies  Allergen Reactions  . Evista [Raloxifene]   . Penicillins Other (See Comments)    unknown  . Triamterene Other (See Comments)    unknown  . Alendronate Sodium Rash    Pt stated oh k to use biotene    Chief Complaint  Patient presents with  . Medical Managment of Chronic Issues    HPI: Patient is a 80 y.o. female seen in the SNF at [facility] today for evaluation of chronic medical conditions.   Hospitalization from 08/27/2013-08/30/2013 for CHF-Furosemide was increased to 60mg  bid and decreased Carvedilol due to bradycardia. Had proBNP 2534, d-dimer 2.97, chest x-ray with central vascular congestion and bilateral pleural effusion.  CBC showed WBC 32.8. BMP was unremarkable.   Problem List Items Addressed This Visit   Acute on chronic diastolic CHF (congestive heart failure)     Daily weights. Continue lasix  60mg  bid and carvedilol 12.5mg  bid.  Hospital chest x-ray showed improvement in her pulmonary edema and effusions. Her proBNP trended down from 2534 to 802 while in hospital 08/2013. Compensated clinically.       Anemia     Resolved, Hgb 13.9 08/02/13      CAD- 3V March 2012- medical Rx (Chronic)     F/u Cardiology.    Chronic anticoagulation- Pradaxa (Chronic)     Continued anticoagulation with pradaxa.        GERD (gastroesophageal reflux disease)     Stable, takes Famotidine 40mg  daily.       HTN (hypertension) - Primary     Controlled on Coreg, Cardizem DC,  Imdur,  and lisinopril for now.      PAD (peripheral artery disease)- LICA stent 4/09 (Chronic)     he left leg--ABI showed PTA 0.75, DPA 0.64 06/23/13. Cardiology referral. No open wounds, denied pain, still warm to touch.      PAF (paroxysmal atrial fibrillation)     bradycardic to the 30s and 40s intermittently with occasional pauses up to and just greater than 2 seconds while  in hospital. Resolved to 60s after Carvedilol was slightly reduced. Continued anticoagulation with pradaxa.       Phantom limb pain (Chronic)     Right AKA--managed with Gabapentin 100mg  nightly. Able to discontinue Hydrocodone/ApAp due to non used.       Unspecified constipation     Managed with MiraLax qod        Review of Systems:  Review of Systems  Constitutional: Negative for fever, chills, weight loss, malaise/fatigue and diaphoresis.  HENT: Positive for hearing loss. Negative for congestion, ear discharge, ear pain and sore throat.   Eyes: Negative for pain, discharge and redness.  Respiratory: Positive for cough. Negative for sputum production, shortness of breath and wheezing.   Cardiovascular: Positive for leg swelling. Negative for chest pain, palpitations, orthopnea, claudication and PND.       Slightly worse 1+  Gastrointestinal: Negative for heartburn, nausea, vomiting, abdominal pain, diarrhea, constipation and blood in stool.  Genitourinary: Positive for frequency (incontinent of bladder). Negative for dysuria, urgency, hematuria and flank pain.  Musculoskeletal: Positive for joint pain. Negative for back pain, falls, myalgias and neck pain.  Skin: Positive for itching and rash.       Chronic psoriasis, very thin and red skin, frequent skin abrasion or tear. Left wrist wart appearance skin lesion.   Neurological: Negative for  dizziness, tingling, tremors, sensory change, speech change, focal weakness, seizures, loss of consciousness, weakness (generalized) and headaches.  Endo/Heme/Allergies: Negative for environmental allergies and polydipsia. Bruises/bleeds easily.  Psychiatric/Behavioral: Positive for memory loss. Negative for depression and hallucinations. The patient is not nervous/anxious and does not have insomnia.      Past Medical History  Diagnosis Date  . Hypertension   . Atrial fibrillation   . Psoriasis   . Congestive heart failure (CHF)   .  History of CVA (cerebrovascular accident)   . Coronary artery disease   . Myocardial infarction   . CHF (congestive heart failure)   . Shortness of breath   . H/O hiatal hernia   . Closed fracture of head of left humerus 5/12  . Basal cell carcinoma   . 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  Flow cytometry 04/04/12: 91% cells CD4 positive CD 26 negative  . Hyperlipemia   . Pneumonia   . Wound of right leg 09/25/2012    Necrotic, open wound right lower leg; non healing  . Stroke   . Hyposmolality and/or hyponatremia   . Lower limb amputation, above knee 10/11/2012  . Pain in joint, multiple sites   . Mycosis fungoides, unspecified site, extranodal and solid organ sites 09/28/2012  . Occlusion and stenosis of carotid artery without mention of cerebral infarction 09/28/2012  . Chronic venous hypertension with ulcer and inflammation 09/28/2012  . Diverticulosis of colon (without mention of hemorrhage) 09/28/2012  . Synovial cyst of popliteal space 03/26/2012  . Unspecified constipation 03/24/2012  . Hypopotassemia 01/26/2012  . Debility, unspecified 01/17/2012  . Anxiety state, unspecified 12/31/2011  . Anemia, unspecified 12/21/2011  . Other specified disease of white blood cells 12/21/2011  . Peripheral vascular disease, unspecified 12/21/2011    recent ABI Lt of 0.64 down from ).80 on 12/29/11- though different labs  . Reflux esophagitis 12/21/2011  . Unspecified hereditary and idiopathic peripheral neuropathy 12/14/2011  . Pain in joint, ankle and foot 12/14/2011  . Carotid stenosis     CAROTID DOPPLER, 05/02/2012 - LEFT VERTEBRAL-occluded, LEFT BULB AND PROXIMAL ICA STENT-moderate amount of irregular mixed dense plaque 50-69% diameter reduction  . Pre-syncope     NUCLEAR STRESS TEST, 12/10/2009 - normal, EKG negative for ischemia  . TIA  (transient ischemic attack)     2D ECHO, 12/05/2012 - EF 60-65%, Severely calcified annulus of the mitral valve with mild-moderate regurgitation, LA moderate-severely dilated  . Paroxysmal atrial fibrillation   . CAD in native artery     3 vessel CAD, medical therapy, not a candidate for CABG  . S/P AKA (above knee amputation) unilateral, Rt leg due to PAD and non healing wound 06/27/2013  . HCAP (healthcare-associated pneumonia) 01/08/2012   Past Surgical History  Procedure Laterality Date  . Tonsillectomy    . Dilation and curettage of uterus    . Eye surgery    . Cataracts    . Vertebroplasty    . Amputation  10/10/2012    Procedure: AMPUTATION ABOVE KNEE;  Surgeon: Newt Minion, MD;  Location: [place];  Service: Orthopedics;  Laterality: Right;  . Percutaneous stent intervention  04/13/2011    Left internal carotid artery stented with a 8x30 prcise nitinol self-expanding stent resulting reduction of 90% to less than 20% residual  . Cardiac catheterization  11/06/2010    CABG recommended, turned  down by [facility] secondary to co morbidities   Social History:   reports that she has never smoked. She has never used smokeless tobacco. She reports that she does not drink alcohol or use illicit drugs.  Family History  Problem Relation Age of Onset  . Stomach cancer Mother   . Pneumonia Father   . Cirrhosis Brother   . Diabetes Brother   . Heart attack Paternal Grandmother   . Heart failure Sister   . Diabetes Sister   . Heart failure Sister     Medications: Patient's Medications  New Prescriptions   No medications on file  Previous Medications   ATORVASTATIN (LIPITOR) 40 MG TABLET    Take 1 tablet (40 mg total) by mouth daily at 6 PM.   BEXAROTENE (TARGRETIN) 75 MG CAPS CAPSULE    Take 2 capsules (150 mg total) by mouth daily before supper. Give with food. Protect from light. CAUTION: Chemotherapy/Biotherapy-Wear gloves when handling   CARVEDILOL (COREG) 12.5 MG TABLET    Take 1 tablet  (12.5 mg total) by mouth 2 (two) times daily with a meal.   DABIGATRAN (PRADAXA) 150 MG CAPS    Take 75 mg by mouth every 12 (twelve) hours.    DILTIAZEM (CARDIZEM CD) 120 MG 24 HR CAPSULE    Take 120 mg by mouth daily. Take 1 tablet once daily for BP.   FAMOTIDINE (PEPCID) 20 MG TABLET    Take 40 mg by mouth daily.   FOLIC ACID (FOLVITE) 1 MG TABLET    Take 1 mg by mouth daily.   FUROSEMIDE (LASIX) 40 MG TABLET    Take 1.5 tablets (60 mg total) by mouth 2 (two) times daily.   GABAPENTIN (NEURONTIN) 100 MG CAPSULE    Take 1 capsule (100 mg total) by mouth at bedtime.   GUAIFENESIN (MUCINEX) 600 MG 12 HR TABLET    Take by mouth 2 (two) times daily.   IPRATROPIUM-ALBUTEROL (DUONEB) 0.5-2.5 (3) MG/3ML SOLN    Take 3 mLs by nebulization every 6 (six) hours as needed.    ISOSORBIDE MONONITRATE (IMDUR) 30 MG 24 HR TABLET    Take 1 tablet (30 mg total) by mouth daily.   LEVOCETIRIZINE (XYZAL) 5 MG TABLET    Take 5 mg by mouth as needed for allergies.   LISINOPRIL (PRINIVIL,ZESTRIL) 10 MG TABLET    Take 10 mg by mouth daily.   NITROGLYCERIN (NITROSTAT) 0.4 MG SL TABLET    Place 0.4 mg under the tongue every 5 (five) minutes as needed for chest pain. If no relief call MD.   OMEGA-3 FATTY ACIDS (FISH OIL) 1000 MG CAPS    Take 1,000 mg by mouth at bedtime.    POLYETHYLENE GLYCOL (MIRALAX / GLYCOLAX) PACKET    Take 17 g by mouth every other day. Take 1 packet mixed in 4 oz of beverage every day to relieve constipation.   POLYVINYL ALCOHOL-POVIDONE (REFRESH OP)    Apply 1 drop to eye 4 (four) times daily. Instill 1 drop to both eyes four times daily.*Wait 3-5 minutes between eye drops*   TRIAMCINOLONE CREAM (KENALOG) 0.1 %    Apply topically as directed.   VITAMIN C (ASCORBIC ACID) 500 MG TABLET    Take 500 mg by mouth daily.  Modified Medications   No medications on file  Discontinued Medications   No medications on file     Physical Exam: Physical Exam  Constitutional: She is oriented to person,  place, and time. She appears well-developed and  well-nourished. No distress.  HENT:  Head: Normocephalic.  Right Ear: External ear normal.  Left Ear: External ear normal.  Nose: Nose normal.  Mouth/Throat: Oropharynx is clear and moist. No oropharyngeal exudate.  Eyes: EOM are normal. Pupils are equal, round, and reactive to light. Right conjunctiva is injected.  Only a few eyelashes remained and right conjunctiva mild irritated with bilateral lower eyelid eversion.   Neck: Normal range of motion. Neck supple. JVD present. No thyromegaly present.  Cardiovascular: Normal rate, regular rhythm and normal heart sounds.  Exam reveals decreased pulses.   No murmur heard. Pulses:      Dorsalis pedis pulses are 1+ on the left side.       Posterior tibial pulses are 1+ on the left side.  Pulmonary/Chest: Effort normal and breath sounds normal. No respiratory distress. She has no wheezes. She exhibits no tenderness.  Decreased breath sounds posterior mid lungs.   Abdominal: Soft. Bowel sounds are normal. She exhibits no distension. There is no tenderness.  Genitourinary:  Incontinent of bladder  Musculoskeletal: Normal range of motion. She exhibits edema. She exhibits no tenderness.  R AKA. Left lower leg edema appears worse 1+  Lymphadenopathy:    She has no cervical adenopathy.  Neurological: She is alert and oriented to person, place, and time. She has normal reflexes. No cranial nerve deficit. She exhibits normal muscle tone. Coordination normal.  Skin: Rash noted. There is erythema.  Diffused erythematous scaly skin appearance. Psoriasis. Left wrist wart appearance skin lesion--f/u Dermatology.   Psychiatric: She has a normal mood and affect. Her behavior is normal. Judgment and thought content normal. She exhibits abnormal recent memory.    Filed Vitals:   11/30/13 1613  BP: 150/90  Pulse: 70  Temp: 96.5 F (35.8 C)  TempSrc: Tympanic  Resp: 20      Labs reviewed: Basic  Metabolic Panel:  Recent Labs  07/24/13 1415 08/02/13  08/27/13 1835 08/28/13 0445 08/29/13 0456 08/30/13 0453 09/11/13 1527 09/11/13 1527 10/23/13 1406  NA 140 140  < >  --  137 139 134*  --  142 142  K 4.0 4.3  < >  --  3.7 3.7 4.0  --  3.8 4.0  CL  --   --   < >  --  97 98 94*  --   --   --   CO2 27  --   < >  --  31 29 29   --  25 33*  GLUCOSE 136  --   < >  --  97 97 103*  --  119 102  BUN 12.7 13  < >  --  17 23 30*  --  12.3 13.6  CREATININE 0.7 0.6  < >  --  0.72 0.79 0.85  --  0.7 0.8  CALCIUM 9.6  --   < >  --  9.5 9.5 9.4  --  9.4 9.8  MG  --   --   --  2.0  --   --   --   --   --   --   TSH 0.781 0.99  --   --   --   --   --  1.009  --   --   < > = values in this interval not displayed. Liver Function Tests:  Recent Labs  08/27/13 1150 09/11/13 1527 10/23/13 1406  AST 16 18 20   ALT 11 14 11   ALKPHOS 76 72 74  BILITOT 0.3 0.24  0.31  PROT 7.0 7.3 7.1  ALBUMIN 3.6 3.6 3.7    Recent Labs  07/24/13 1415 09/11/13 1529  LIPASE 20 29  AMYLASE 20 22   CBC:  Recent Labs  08/28/13 0445 09/11/13 1527 10/23/13 1406  WBC 29.8* 33.0* 28.2*  NEUTROABS 6.3 7.5* 5.8  HGB 11.5* 13.2 13.6  HCT 35.5* 40.7 41.7  MCV 93.9 94.4 93.0  PLT 266 298 239   Lipid Panel:  Recent Labs  12/14/12  07/24/13 1415 08/02/13 09/11/13 1529  CHOL 181  --   --   --   --   HDL 30*  --   --   --   --   LDLCALC 106  --   --   --   --   TRIG 225*  < > 248* 188* 303*  < > = values in this interval not displayed.  Past Procedures:  Dg Chest Port 1 View  08/30/2013 CLINICAL DATA: History of CHF with worsening. EXAM: PORTABLE CHEST - 1 VIEW  IMPRESSION: The findings are consistent with the CHF with evidence of some mild improvement in the interstitial edema since the earlier study.   Dg Chest Portable 1 View  08/27/2013 CLINICAL DATA: Cough, congestion EXAM: PORTABLE CHEST - 1 VIEW IMPRESSION: Central vascular congestion without convincing pulmonary edema. Bilateral small  pleural effusion with bilateral basilar atelectasis or infiltrate.   Assessment/Plan Acute on chronic diastolic CHF (congestive heart failure) Daily weights. Continue lasix  60mg  bid and carvedilol 12.5mg  bid.  Hospital chest x-ray showed improvement in her pulmonary edema and effusions. Her proBNP trended down from 2534 to 802 while in hospital 08/2013. Compensated clinically.     Anemia Resolved, Hgb 13.9 08/02/13    CAD- 3V March 2012- medical Rx F/u Cardiology.  Chronic anticoagulation- Pradaxa Continued anticoagulation with pradaxa.      GERD (gastroesophageal reflux disease) Stable, takes Famotidine 40mg  daily.     HTN (hypertension) Controlled on Coreg, Cardizem DC,  Imdur,  and lisinopril for now.    PAD (peripheral artery disease)- LICA stent [id number] he left leg--ABI showed PTA 0.75, DPA 0.64 06/23/13. Cardiology referral. No open wounds, denied pain, still warm to touch.    PAF (paroxysmal atrial fibrillation) bradycardic to the 30s and 40s intermittently with occasional pauses up to and just greater than 2 seconds while in hospital. Resolved to 60s after Carvedilol was slightly reduced. Continued anticoagulation with pradaxa.     Phantom limb pain Right AKA--managed with Gabapentin 100mg  nightly. Able to discontinue Hydrocodone/ApAp due to non used.     Unspecified constipation Managed with MiraLax qod     Family/ Staff Communication: observe the patient  Goals of Care: SNF  Labs/tests ordered: none

## 2013-12-11 NOTE — Assessment & Plan Note (Signed)
Continued anticoagulation with pradaxa.

## 2013-12-11 NOTE — Assessment & Plan Note (Signed)
Controlled on Coreg, Cardizem DC,  Imdur,  and lisinopril for now.

## 2013-12-11 NOTE — Assessment & Plan Note (Signed)
Right AKA--managed with Gabapentin 100mg  nightly. Able to discontinue Hydrocodone/ApAp due to non used.

## 2013-12-11 NOTE — Assessment & Plan Note (Signed)
History of CAD by cath March 2012. She is being treated medically. She denies chest pain or shortness of breath.

## 2013-12-11 NOTE — Assessment & Plan Note (Signed)
Resolved, Hgb 13.9 08/02/13

## 2013-12-11 NOTE — Assessment & Plan Note (Signed)
he left leg--ABI showed PTA 0.75, DPA 0.64 06/23/13. Cardiology referral. No open wounds, denied pain, still warm to touch.

## 2013-12-11 NOTE — Assessment & Plan Note (Signed)
Maintaining sinus rhythm on Pradaxa anticoagulation

## 2013-12-11 NOTE — Patient Instructions (Signed)
  We will see you back in follow up in : 6 months with [name] and 1 year with Dr Gwenlyn Found  Dr Gwenlyn Found has ordered : 1. Carotid Duplex- This test is an ultrasound of the carotid arteries in your neck. It looks at blood flow through these arteries that supply the brain with blood. Allow one hour for this exam. There are no restrictions or special instructions.

## 2013-12-11 NOTE — Assessment & Plan Note (Signed)
Status post left internal carotid artery stenting by myself July 2012. She remains neurologically asymptomatic. We've been following this but difficult to sound. She does have a bruit on that side. Over a year since this was checked. I will reorder a carotid Doppler study.

## 2013-12-11 NOTE — Assessment & Plan Note (Signed)
Mildly elevated on appropriate antihypertensive medications

## 2013-12-11 NOTE — Assessment & Plan Note (Signed)
bradycardic to the 30s and 40s intermittently with occasional pauses up to and just greater than 2 seconds while in hospital. Resolved to 60s after Carvedilol was slightly reduced. Continued anticoagulation with pradaxa.

## 2013-12-11 NOTE — Progress Notes (Signed)
12/11/2013 Sara [name]   July 05, 1919  [id number]  Primary Physician GREEN, Viviann Spare, MD Primary Cardiologist: Lorretta Harp MD Renae Gloss   HPI:  This 80 year old patient normally sees Dr. Gwenlyn Found. She is here for post hospitalization after CVA. She was hospitalized first in January 2014 and had an above-the-knee amputation then she was readmitted for syncope felt to be symptomatic hypotension. The MRI of the brain was negative for acute infarct. She had an echo and carotid Dopplers that were stable and no other issues. She was given IV fluids. At that time, she was on aspirin and Plavix. Her carvedilol was down to 12.5 mg twice a day and her lisinopril was at 5 mg a day, previously had been at 40. She was readmitted in February due to dyspnea on exertion and nonhealing wound. She improved from that hospitalization and I do not see that we were consulted on any of these hospitalizations. On December 03, 2012, she was readmitted with acute stroke. She was having speech difficulties. She knew what she wanted to say but she could not get the words out. She was found to be in A-fib with RVR and medications were adjusted including increasing her carvedilol back to 25 mg twice a day. She was discharged on December 05, 2012, and is in the skilled nursing facility at [facility] for rehab from her AKA as well as her CVA, though, she has not much residual from the CVA. At the time of discharge, on December 03, 2012, she was in sinus rhythm. During that hospitalization she had A-fib with RVR but on arrival to [facility] she was back in sinus rhythm as well. Since that time, she stabilized. Previously, we only had her on aspirin and Plavix, but due to the new right-sided CVA, most likely embolic from her A-fib, we added Pradaxa and stopped her aspirin completely. Additionally, in the past she has been diagnosed with psoriasis but she has been followed with Dr. 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 Other history does include coronary artery disease, significant 3-vessel disease and is not felt to be a good bypass candidate. She was seen by Dr. Cyndia Bent in the past and medical therapy only has been recommended. She has also had a high-grade left internal carotid artery stenosis that Dr. Gwenlyn Found stented July 2012 under the [facility]  protocol. Dopplers during this year with hospitalization reveal that the stent was widely patent.   The patient overall has done fairly well. She feels much better now and she is being treated for her skin lymphoma. Since she was last seen in the office 01/15/13 [name] RN P. She feels clinically well. She specifically denies chest pain or shortness of breath. She is wheelchair-bound because of her right above-the-knee amputation was performed 10/10/12 and has yet to be fitted with a prosthesis. She sought [name] in the office in December. She had recently been admitted with congestive heart failure thought to be related to diastolic dysfunction with PAF. Her medicines were adjusted and she was diuresed. She was at a friend's home and is wheelchair-bound. Since that time she's been fairly asymptomatic.    Current Outpatient Prescriptions  Medication Sig Dispense Refill  . atorvastatin (LIPITOR) 40 MG tablet Take 1 tablet (40 mg total) by mouth daily at 6 PM.      . bexarotene (TARGRETIN) 75 MG CAPS capsule Take 2  capsules (150 mg total) by mouth daily before supper. Give with food. Protect from light. CAUTION: Chemotherapy/Biotherapy-Wear gloves when handling  180 capsule  0  . carvedilol (COREG) 12.5 MG tablet Take 1 tablet (12.5 mg total) by mouth 2 (two) times daily with a meal.  60 tablet  0  . dabigatran (PRADAXA) 150 MG CAPS Take 75 mg by mouth every 12 (twelve) hours.       Marland Kitchen diltiazem (CARDIZEM CD) 120 MG 24 hr capsule Take 120 mg by mouth daily. Take 1 tablet once daily for BP.      .  famotidine (PEPCID) 20 MG tablet Take 40 mg by mouth daily.      . folic acid (FOLVITE) 1 MG tablet Take 1 mg by mouth daily.      . furosemide (LASIX) 40 MG tablet Take 1.5 tablets (60 mg total) by mouth 2 (two) times daily.  30 tablet  0  . gabapentin (NEURONTIN) 100 MG capsule Take 1 capsule (100 mg total) by mouth at bedtime.      Marland Kitchen guaiFENesin (MUCINEX) 600 MG 12 hr tablet Take by mouth 2 (two) times daily.      Marland Kitchen ipratropium-albuterol (DUONEB) 0.5-2.5 (3) MG/3ML SOLN Take 3 mLs by nebulization every 6 (six) hours as needed.       . isosorbide mononitrate (IMDUR) 30 MG 24 hr tablet Take 1 tablet (30 mg total) by mouth daily.  30 tablet  6  . levocetirizine (XYZAL) 5 MG tablet Take 5 mg by mouth as needed for allergies.      Marland Kitchen lisinopril (PRINIVIL,ZESTRIL) 10 MG tablet Take 10 mg by mouth daily.      . nitroGLYCERIN (NITROSTAT) 0.4 MG SL tablet Place 0.4 mg under the tongue every 5 (five) minutes as needed for chest pain. If no relief call MD.      . Omega-3 Fatty Acids (FISH OIL) 1000 MG CAPS Take 1,000 mg by mouth at bedtime.       . polyethylene glycol (MIRALAX / GLYCOLAX) packet Take 17 g by mouth every other day. Take 1 packet mixed in 4 oz of beverage every day to relieve constipation.      . Polyvinyl Alcohol-Povidone (REFRESH OP) Apply 1 drop to eye 4 (four) times daily. Instill 1 drop to both eyes four times daily.*Wait 3-5 minutes between eye drops*      . triamcinolone cream (KENALOG) 0.1 % Apply topically as directed.      . vitamin C (ASCORBIC ACID) 500 MG tablet Take 500 mg by mouth daily.       No current facility-administered medications for this visit.    Allergies  Allergen Reactions  . Evista [Raloxifene]   . Penicillins Other (See Comments)    unknown  . Triamterene Other (See Comments)    unknown  . Alendronate Sodium Rash    Pt stated oh k to use biotene    History   Social History  . Marital Status: Widowed    Spouse Name: N/A    Number of Children: 2  .  Years of Education: N/A   Occupational History  . Not on file.   Social History Main Topics  . Smoking status: Never Smoker   . Smokeless tobacco: Never Used  . Alcohol Use: No  . Drug Use: No  . Sexual Activity: No   Other Topics Concern  . Not on file   Social History Narrative  . No narrative on file     Review of  Systems: General: negative for chills, fever, night sweats or weight changes.  Cardiovascular: negative for chest pain, dyspnea on exertion, edema, orthopnea, palpitations, paroxysmal nocturnal dyspnea or shortness of breath Dermatological: negative for rash Respiratory: negative for cough or wheezing Urologic: negative for hematuria Abdominal: negative for nausea, vomiting, diarrhea, bright red blood per rectum, melena, or hematemesis Neurologic: negative for visual changes, syncope, or dizziness All other systems reviewed and are otherwise negative except as noted above.    Blood pressure 156/80, pulse 68, height 5\' 6"  (1.676 m), weight 138 lb 1.6 oz (62.642 kg).  General appearance: alert and no distress Neck: no adenopathy, no JVD, supple, symmetrical, trachea midline, thyroid not enlarged, symmetric, no tenderness/mass/nodules and bilateral carotid bruits left louder than right Lungs: clear to auscultation bilaterally Heart: regular rate and rhythm, S1, S2 normal, no murmur, click, rub or gallop Extremities: extremities normal, atraumatic, no cyanosis or edema and right BKA, no edema on the left  EKG normal sinus rhythm at 68 with nonspecific ST and T wave changes  ASSESSMENT AND PLAN:   PAD (peripheral artery disease)- LICA stent 4/62 Status post left internal carotid artery stenting by myself July 2012. She remains neurologically asymptomatic. We've been following this but difficult to sound. She does have a bruit on that side. Over a year since this was checked. I will reorder a carotid Doppler study.  PAF (paroxysmal atrial fibrillation) Maintaining  sinus rhythm on Pradaxa anticoagulation  HTN (hypertension) Mildly elevated on appropriate antihypertensive medications  Hyperlipidemia On statin therapy followed by her PCP  CAD- 3V March 2012- medical Rx History of CAD by cath March 2012. She is being treated medically. She denies chest pain or shortness of breath.      Lorretta Harp MD FACP,FACC,FAHA, [facility] 12/11/2013 9:56 AM

## 2013-12-20 ENCOUNTER — Telehealth: Payer: Self-pay | Admitting: *Deleted

## 2013-12-20 NOTE — Telephone Encounter (Signed)
Sara [name] stated that Dr. Nyoka Cowden at [facility] wrote some orders for Sara [name] and he wanted Dr Gwenlyn Found to be aware. He also has had some weight gain that he wanted to tell him about that too.  JB

## 2013-12-20 NOTE — Telephone Encounter (Signed)
Returned call to [place].  Stated pt had SOB yesterday and pt was given a neb tx.  No SOB today.  Talked to Dr. Rolly Salter NP b/c pt's weight gain was up 2.1 lbs and she ordered CXR and prn Lasix: 40 mg daily prn wt gain 2-3 lbs and BMET and BNP tomorrow.  Stated NP wanted her to call the office to inform of the orders.  Informed MD will be notified.  Verbalized understanding.  Message forwarded to Curt Bears, RN to discuss w/ Dr. Gwenlyn Found.

## 2013-12-22 LAB — BASIC METABOLIC PANEL
BUN: 12 mg/dL (ref 4–21)
Creatinine: 0.6 mg/dL (ref 0.5–1.1)
Glucose: 90 mg/dL
Potassium: 3.7 mmol/L (ref 3.4–5.3)
Sodium: 140 mmol/L (ref 137–147)

## 2013-12-24 ENCOUNTER — Non-Acute Institutional Stay (SKILLED_NURSING_FACILITY): Payer: Medicare Other | Admitting: Nurse Practitioner

## 2013-12-24 ENCOUNTER — Encounter: Payer: Self-pay | Admitting: Nurse Practitioner

## 2013-12-24 DIAGNOSIS — R0602 Shortness of breath: Secondary | ICD-10-CM

## 2013-12-24 DIAGNOSIS — K219 Gastro-esophageal reflux disease without esophagitis: Secondary | ICD-10-CM

## 2013-12-24 DIAGNOSIS — I509 Heart failure, unspecified: Secondary | ICD-10-CM

## 2013-12-24 DIAGNOSIS — I5033 Acute on chronic diastolic (congestive) heart failure: Secondary | ICD-10-CM

## 2013-12-24 DIAGNOSIS — I480 Paroxysmal atrial fibrillation: Secondary | ICD-10-CM

## 2013-12-24 DIAGNOSIS — J189 Pneumonia, unspecified organism: Secondary | ICD-10-CM | POA: Insufficient documentation

## 2013-12-24 DIAGNOSIS — D649 Anemia, unspecified: Secondary | ICD-10-CM

## 2013-12-24 DIAGNOSIS — I10 Essential (primary) hypertension: Secondary | ICD-10-CM

## 2013-12-24 DIAGNOSIS — I4891 Unspecified atrial fibrillation: Secondary | ICD-10-CM

## 2013-12-24 DIAGNOSIS — K5900 Constipation, unspecified: Secondary | ICD-10-CM

## 2013-12-24 NOTE — Assessment & Plan Note (Signed)
Managed with MiraLax qod   

## 2013-12-24 NOTE — Assessment & Plan Note (Signed)
bradycardic to the 30s and 40s intermittently with occasional pauses up to and just greater than 2 seconds while in hospital. Resolved to 60s after Carvedilol was slightly reduced. Continued anticoagulation with pradaxa and Diltiazem   

## 2013-12-24 NOTE — Assessment & Plan Note (Signed)
Daily weights. Continue lasix  60mg  bid and carvedilol 12.5mg  bid.  Hospital chest x-ray showed improvement in her pulmonary edema and effusions. Her proBNP trended down from 2534 to 802 while in hospital 08/2013. C/o DOE. CXR 12/20/13 showed no pulmonary vascular congestion. BNP 364.5. Prn Furosemide 40mg  daily for weight >2-3 Ibs/day or 3-5Ibs/week available to her. See Dr. Gwenlyn Found Cardiology 12/25/13

## 2013-12-24 NOTE — Assessment & Plan Note (Signed)
Presented with SOB. CXR showed patchy bibasilar atelectasis or pneumonia worse on the right and unchanged on the left. Levaquin 750mg  x7 days started 12/20/13. The patient c/o insomnia while on ABT-will dc switch to Doxycycline 100mg  bid for 7 days.

## 2013-12-24 NOTE — Assessment & Plan Note (Signed)
Controlled on Coreg, Cardizem DC,  Imdur,  and lisinopril for now.   

## 2013-12-24 NOTE — Progress Notes (Signed)
Patient ID: Sara [name], female   DOB: 31-Jul-1919, 80 y.o.   MRN: [id number]   Code Status: DNR  Allergies  Allergen Reactions  . Evista [Raloxifene]   . Penicillins Other (See Comments)    unknown  . Triamterene Other (See Comments)    unknown  . Alendronate Sodium Rash    Pt stated oh k to use biotene    Chief Complaint  Patient presents with  . Medical Managment of Chronic Issues  . Acute Visit    more swelling LLE, PNA    HPI: Patient is a 80 y.o. female seen in the SNF at [facility] today for evaluation of SOB, PNA, LLE swelling, and chronic medical conditions.   Hospitalization from 08/27/2013-08/30/2013 for CHF-Furosemide was increased to 60mg  bid and decreased Carvedilol due to bradycardia. Had proBNP 2534, d-dimer 2.97, chest x-ray with central vascular congestion and bilateral pleural effusion.  CBC showed WBC 32.8. BMP was unremarkable.   Problem List Items Addressed This Visit   Acute on chronic diastolic CHF (congestive heart failure) - Primary     Daily weights. Continue lasix  60mg  bid and carvedilol 12.5mg  bid.  Hospital chest x-ray showed improvement in her pulmonary edema and effusions. Her proBNP trended down from 2534 to 802 while in hospital 08/2013. C/o DOE. CXR 12/20/13 showed no pulmonary vascular congestion. BNP 364.5. Prn Furosemide 40mg  daily for weight >2-3 Ibs/day or 3-5Ibs/week available to her. See Dr. Gwenlyn Found Cardiology 12/25/13      Anemia     Resolved, Hgb 13.9 08/02/13, 11.5 08/28/14. Takes Folic acid.     GERD (gastroesophageal reflux disease)     Stable, takes Famotidine 40mg  daily.       HTN (hypertension)     Controlled on Coreg, Cardizem DC,  Imdur,  and lisinopril for now.      PAF (paroxysmal atrial fibrillation)     bradycardic to the 30s and 40s intermittently with occasional pauses up to and just greater than 2 seconds while in hospital. Resolved to 60s after Carvedilol was slightly reduced. Continued anticoagulation with  pradaxa and Diltiazem     PNA (pneumonia)     Presented with SOB. CXR showed patchy bibasilar atelectasis or pneumonia worse on the right and unchanged on the left. Levaquin 750mg  x7 days started 12/20/13. The patient c/o insomnia while on ABT-will dc switch to Doxycycline 100mg  bid for 7 days.     SOB (shortness of breath)     DOE    Unspecified constipation     Managed with MiraLax qod         Review of Systems:  Review of Systems  Constitutional: Negative for fever, chills, weight loss, malaise/fatigue and diaphoresis.  HENT: Positive for hearing loss. Negative for congestion, ear discharge, ear pain and sore throat.   Eyes: Negative for pain, discharge and redness.  Respiratory: Positive for cough. Negative for sputum production, shortness of breath and wheezing.        DOE  Cardiovascular: Positive for leg swelling. Negative for chest pain, palpitations, orthopnea, claudication and PND.       Slightly worse 1+  Gastrointestinal: Negative for heartburn, nausea, vomiting, abdominal pain, diarrhea, constipation and blood in stool.  Genitourinary: Positive for frequency (incontinent of bladder). Negative for dysuria, urgency, hematuria and flank pain.  Musculoskeletal: Positive for joint pain. Negative for back pain, falls, myalgias and neck pain.  Skin: Positive for itching and rash.       Chronic psoriasis, very thin and red  skin, frequent skin abrasion or tear. Left wrist wart appearance skin lesion.   Neurological: Negative for dizziness, tingling, tremors, sensory change, speech change, focal weakness, seizures, loss of consciousness, weakness (generalized) and headaches.  Endo/Heme/Allergies: Negative for environmental allergies and polydipsia. Bruises/bleeds easily.  Psychiatric/Behavioral: Positive for memory loss. Negative for depression and hallucinations. The patient is not nervous/anxious and does not have insomnia.      Past Medical History  Diagnosis Date  .  Hypertension   . Atrial fibrillation   . Psoriasis   . Congestive heart failure (CHF)   . History of CVA (cerebrovascular accident)   . Coronary artery disease   . Myocardial infarction   . CHF (congestive heart failure)   . Shortness of breath   . H/O hiatal hernia   . Closed fracture of head of left humerus 5/12  . Basal cell carcinoma   . 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  Flow cytometry 04/04/12: 91% cells CD4 positive CD 26 negative  . Hyperlipemia   . Pneumonia   . Wound of right leg 09/25/2012    Necrotic, open wound right lower leg; non healing  . Stroke   . Hyposmolality and/or hyponatremia   . Lower limb amputation, above knee 10/11/2012  . Pain in joint, multiple sites   . Mycosis fungoides, unspecified site, extranodal and solid organ sites 09/28/2012  . Occlusion and stenosis of carotid artery without mention of cerebral infarction 09/28/2012  . Chronic venous hypertension with ulcer and inflammation 09/28/2012  . Diverticulosis of colon (without mention of hemorrhage) 09/28/2012  . Synovial cyst of popliteal space 03/26/2012  . Unspecified constipation 03/24/2012  . Hypopotassemia 01/26/2012  . Debility, unspecified 01/17/2012  . Anxiety state, unspecified 12/31/2011  . Anemia, unspecified 12/21/2011  . Other specified disease of white blood cells 12/21/2011  . Peripheral vascular disease, unspecified 12/21/2011    recent ABI Lt of 0.64 down from ).80 on 12/29/11- though different labs  . Reflux esophagitis 12/21/2011  . Unspecified hereditary and idiopathic peripheral neuropathy 12/14/2011  . Pain in joint, ankle and foot 12/14/2011  . Carotid stenosis     CAROTID DOPPLER, 05/02/2012 - LEFT VERTEBRAL-occluded, LEFT BULB AND PROXIMAL ICA STENT-moderate amount of irregular mixed dense plaque 50-69% diameter reduction  .  Pre-syncope     NUCLEAR STRESS TEST, 12/10/2009 - normal, EKG negative for ischemia  . TIA (transient ischemic attack)     2D ECHO, 12/05/2012 - EF 60-65%, Severely calcified annulus of the mitral valve with mild-moderate regurgitation, LA moderate-severely dilated  . Paroxysmal atrial fibrillation   . CAD in native artery     3 vessel CAD, medical therapy, not a candidate for CABG  . S/P AKA (above knee amputation) unilateral, Rt leg due to PAD and non healing wound 06/27/2013  . HCAP (healthcare-associated pneumonia) 01/08/2012   Past Surgical History  Procedure Laterality Date  . Tonsillectomy    . Dilation and curettage of uterus    . Eye surgery    . Cataracts    . Vertebroplasty    . Amputation  10/10/2012    Procedure: AMPUTATION ABOVE KNEE;  Surgeon: Nadara Mustard, MD;  Location: [facility] OR;  Service: Orthopedics;  Laterality: Right;  . Percutaneous stent intervention  04/13/2011    Left internal carotid artery stented with a 8x30 prcise nitinol self-expanding stent resulting reduction of 90%  to less than 20% residual  . Cardiac catheterization  11/06/2010    CABG recommended, turned down by [facility] secondary to co morbidities   Social History:   reports that she has never smoked. She has never used smokeless tobacco. She reports that she does not drink alcohol or use illicit drugs.  Family History  Problem Relation Age of Onset  . Stomach cancer Mother   . Pneumonia Father   . Cirrhosis Brother   . Diabetes Brother   . Heart attack Paternal Grandmother   . Heart failure Sister   . Diabetes Sister   . Heart failure Sister     Medications: Patient's Medications  New Prescriptions   No medications on file  Previous Medications   ATORVASTATIN (LIPITOR) 40 MG TABLET    Take 1 tablet (40 mg total) by mouth daily at 6 PM.   BEXAROTENE (TARGRETIN) 75 MG CAPS CAPSULE    Take 2 capsules (150 mg total) by mouth daily before supper. Give with food. Protect from light. CAUTION:  Chemotherapy/Biotherapy-Wear gloves when handling   CARVEDILOL (COREG) 12.5 MG TABLET    Take 1 tablet (12.5 mg total) by mouth 2 (two) times daily with a meal.   DABIGATRAN (PRADAXA) 150 MG CAPS    Take 75 mg by mouth every 12 (twelve) hours.    DILTIAZEM (CARDIZEM CD) 120 MG 24 HR CAPSULE    Take 120 mg by mouth daily. Take 1 tablet once daily for BP.   FAMOTIDINE (PEPCID) 20 MG TABLET    Take 40 mg by mouth daily.   FOLIC ACID (FOLVITE) 1 MG TABLET    Take 1 mg by mouth daily.   FUROSEMIDE (LASIX) 40 MG TABLET    Take 1.5 tablets (60 mg total) by mouth 2 (two) times daily.   GABAPENTIN (NEURONTIN) 100 MG CAPSULE    Take 1 capsule (100 mg total) by mouth at bedtime.   GUAIFENESIN (MUCINEX) 600 MG 12 HR TABLET    Take by mouth 2 (two) times daily.   IPRATROPIUM-ALBUTEROL (DUONEB) 0.5-2.5 (3) MG/3ML SOLN    Take 3 mLs by nebulization every 6 (six) hours as needed.    ISOSORBIDE MONONITRATE (IMDUR) 30 MG 24 HR TABLET    Take 1 tablet (30 mg total) by mouth daily.   LEVOCETIRIZINE (XYZAL) 5 MG TABLET    Take 5 mg by mouth as needed for allergies.   LISINOPRIL (PRINIVIL,ZESTRIL) 10 MG TABLET    Take 10 mg by mouth daily.   NITROGLYCERIN (NITROSTAT) 0.4 MG SL TABLET    Place 0.4 mg under the tongue every 5 (five) minutes as needed for chest pain. If no relief call MD.   OMEGA-3 FATTY ACIDS (FISH OIL) 1000 MG CAPS    Take 1,000 mg by mouth at bedtime.    POLYETHYLENE GLYCOL (MIRALAX / GLYCOLAX) PACKET    Take 17 g by mouth every other day. Take 1 packet mixed in 4 oz of beverage every day to relieve constipation.   POLYVINYL ALCOHOL-POVIDONE (REFRESH OP)    Apply 1 drop to eye 4 (four) times daily. Instill 1 drop to both eyes four times daily.*Wait 3-5 minutes between eye drops*   TRIAMCINOLONE CREAM (KENALOG) 0.1 %    Apply topically as directed.   VITAMIN C (ASCORBIC ACID) 500 MG TABLET    Take 500 mg by mouth daily.  Modified Medications   No medications on file  Discontinued Medications   No  medications on file     Physical  Exam: Physical Exam  Constitutional: She is oriented to person, place, and time. She appears well-developed and well-nourished. No distress.  HENT:  Head: Normocephalic.  Right Ear: External ear normal.  Left Ear: External ear normal.  Nose: Nose normal.  Mouth/Throat: Oropharynx is clear and moist. No oropharyngeal exudate.  Eyes: EOM are normal. Pupils are equal, round, and reactive to light. Right conjunctiva is injected.  Only a few eyelashes remained and right conjunctiva mild irritated with bilateral lower eyelid eversion.   Neck: Normal range of motion. Neck supple. JVD present. No thyromegaly present.  Cardiovascular: Normal rate, regular rhythm and normal heart sounds.  Exam reveals decreased pulses.   No murmur heard. Pulses:      Dorsalis pedis pulses are 1+ on the left side.       Posterior tibial pulses are 1+ on the left side.  Pulmonary/Chest: Effort normal and breath sounds normal. No respiratory distress. She has no wheezes. She exhibits no tenderness.  Decreased breath sounds posterior mid lungs.   Abdominal: Soft. Bowel sounds are normal. She exhibits no distension. There is no tenderness.  Genitourinary:  Incontinent of bladder  Musculoskeletal: Normal range of motion. She exhibits edema. She exhibits no tenderness.  R AKA. Left lower leg edema appears worse 1+  Lymphadenopathy:    She has no cervical adenopathy.  Neurological: She is alert and oriented to person, place, and time. She has normal reflexes. No cranial nerve deficit. She exhibits normal muscle tone. Coordination normal.  Skin: Rash noted. There is erythema.  Diffused erythematous scaly skin appearance. Psoriasis. Left wrist wart appearance skin lesion--f/u Dermatology.   Psychiatric: She has a normal mood and affect. Her behavior is normal. Judgment and thought content normal. She exhibits abnormal recent memory.    Filed Vitals:   12/24/13 1137  BP: 120/76    Pulse: 60  Temp: 97.7 F (36.5 C)  TempSrc: Tympanic  Resp: 20      Labs reviewed: Basic Metabolic Panel:  Recent Labs  07/24/13 1415 08/02/13  08/27/13 1835 08/28/13 0445 08/29/13 0456 08/30/13 0453 09/11/13 1527 09/11/13 1527 10/23/13 1406 12/22/13  NA 140 140  < >  --  137 139 134*  --  142 142 140  K 4.0 4.3  < >  --  3.7 3.7 4.0  --  3.8 4.0 3.7  CL  --   --   < >  --  97 98 94*  --   --   --   --   CO2 27  --   < >  --  31 29 29   --  25 33*  --   GLUCOSE 136  --   < >  --  97 97 103*  --  119 102  --   BUN 12.7 13  < >  --  17 23 30*  --  12.3 13.6 12  CREATININE 0.7 0.6  < >  --  0.72 0.79 0.85  --  0.7 0.8 0.6  CALCIUM 9.6  --   < >  --  9.5 9.5 9.4  --  9.4 9.8  --   MG  --   --   --  2.0  --   --   --   --   --   --   --   TSH 0.781 0.99  --   --   --   --   --  1.009  --   --   --   < > =  values in this interval not displayed. Liver Function Tests:  Recent Labs  08/27/13 1150 09/11/13 1527 10/23/13 1406  AST 16 18 20   ALT 11 14 11   ALKPHOS 76 72 74  BILITOT 0.3 0.24 0.31  PROT 7.0 7.3 7.1  ALBUMIN 3.6 3.6 3.7    Recent Labs  07/24/13 1415 09/11/13 1529  LIPASE 20 29  AMYLASE 20 22   CBC:  Recent Labs  08/28/13 0445 09/11/13 1527 10/23/13 1406  WBC 29.8* 33.0* 28.2*  NEUTROABS 6.3 7.5* 5.8  HGB 11.5* 13.2 13.6  HCT 35.5* 40.7 41.7  MCV 93.9 94.4 93.0  PLT 266 298 239   Lipid Panel:  Recent Labs  07/24/13 1415 08/02/13 09/11/13 1529  TRIG 248* 188* 303*    Past Procedures:  Dg Chest Port 1 View  08/30/2013 CLINICAL DATA: History of CHF with worsening. EXAM: PORTABLE CHEST - 1 VIEW  IMPRESSION: The findings are consistent with the CHF with evidence of some mild improvement in the interstitial edema since the earlier study.   Dg Chest Portable 1 View  08/27/2013 CLINICAL DATA: Cough, congestion EXAM: PORTABLE CHEST - 1 VIEW IMPRESSION: Central vascular congestion without convincing pulmonary edema. Bilateral small pleural  effusion with bilateral basilar atelectasis or infiltrate.  12/20/13 CXR no cardiomegaly or pulmonary vascular congestion, small bilateral pleural effusions unchanged, patchy bibasilar atelectasis or pneumonia worse on the right and unchanged on the left, old granulomatous disease re demonstrated.   Assessment/Plan Acute on chronic diastolic CHF (congestive heart failure) Daily weights. Continue lasix  60mg  bid and carvedilol 12.5mg  bid.  Hospital chest x-ray showed improvement in her pulmonary edema and effusions. Her proBNP trended down from 2534 to 802 while in hospital 08/2013. C/o DOE. CXR 12/20/13 showed no pulmonary vascular congestion. BNP 364.5. Prn Furosemide 40mg  daily for weight >2-3 Ibs/day or 3-5Ibs/week available to her. See Dr. Gwenlyn Found Cardiology 12/25/13    SOB (shortness of breath) DOE  PNA (pneumonia) Presented with SOB. CXR showed patchy bibasilar atelectasis or pneumonia worse on the right and unchanged on the left. Levaquin 750mg  x7 days started 12/20/13. The patient c/o insomnia while on ABT-will dc switch to Doxycycline 100mg  bid for 7 days.   Unspecified constipation Managed with MiraLax qod    HTN (hypertension) Controlled on Coreg, Cardizem DC,  Imdur,  and lisinopril for now.    PAF (paroxysmal atrial fibrillation) bradycardic to the 30s and 40s intermittently with occasional pauses up to and just greater than 2 seconds while in hospital. Resolved to 60s after Carvedilol was slightly reduced. Continued anticoagulation with pradaxa and Diltiazem   Anemia Resolved, Hgb 13.9 08/02/13, 11.5 08/28/14. Takes Folic acid.   GERD (gastroesophageal reflux disease) Stable, takes Famotidine 40mg  daily.       Family/ Staff Communication: observe the patient  Goals of Care: SNF  Labs/tests ordered: none

## 2013-12-24 NOTE — Assessment & Plan Note (Signed)
Resolved, Hgb 13.9 08/02/13, 11.5 08/28/14. Takes Folic acid.

## 2013-12-24 NOTE — Assessment & Plan Note (Signed)
Stable, takes Famotidine 40mg daily.    

## 2013-12-24 NOTE — Assessment & Plan Note (Signed)
DOE

## 2013-12-25 ENCOUNTER — Ambulatory Visit (HOSPITAL_COMMUNITY)
Admission: RE | Admit: 2013-12-25 | Discharge: 2013-12-25 | Disposition: A | Discharge status: Home / Self Care | Payer: Medicare Other | Source: Ambulatory Visit | Attending: Cardiovascular Disease | Admitting: Cardiovascular Disease

## 2013-12-25 DIAGNOSIS — I6529 Occlusion and stenosis of unspecified carotid artery: Secondary | ICD-10-CM | POA: Insufficient documentation

## 2013-12-25 NOTE — Progress Notes (Signed)
Carotid Duplex Completed. Dustin Burrill, BS, RDMS, RVT  

## 2013-12-28 ENCOUNTER — Ambulatory Visit (HOSPITAL_BASED_OUTPATIENT_CLINIC_OR_DEPARTMENT_OTHER): Payer: Medicare Other | Admitting: Hematology and Oncology

## 2013-12-28 ENCOUNTER — Other Ambulatory Visit (HOSPITAL_BASED_OUTPATIENT_CLINIC_OR_DEPARTMENT_OTHER): Payer: Medicare Other

## 2013-12-28 ENCOUNTER — Telehealth: Payer: Self-pay | Admitting: Hematology and Oncology

## 2013-12-28 VITALS — BP 93/51 | HR 65 | Resp 18 | Ht 66.0 in

## 2013-12-28 DIAGNOSIS — D72820 Lymphocytosis (symptomatic): Secondary | ICD-10-CM

## 2013-12-28 DIAGNOSIS — I509 Heart failure, unspecified: Secondary | ICD-10-CM

## 2013-12-28 DIAGNOSIS — E785 Hyperlipidemia, unspecified: Secondary | ICD-10-CM

## 2013-12-28 DIAGNOSIS — C8404 Mycosis fungoides, lymph nodes of axilla and upper limb: Secondary | ICD-10-CM

## 2013-12-28 DIAGNOSIS — E039 Hypothyroidism, unspecified: Secondary | ICD-10-CM

## 2013-12-28 DIAGNOSIS — C84A0 Cutaneous T-cell lymphoma, unspecified, unspecified site: Secondary | ICD-10-CM

## 2013-12-28 LAB — LIPID PANEL
Cholesterol: 220 mg/dL — ABNORMAL HIGH (ref 0–200)
HDL: 30 mg/dL — ABNORMAL LOW (ref >39)
LDL CALC: 134 mg/dL — AB (ref 0–99)
Total CHOL/HDL Ratio: 7.3 Ratio
Triglycerides: 281 mg/dL — ABNORMAL HIGH (ref <150)
VLDL: 56 mg/dL — ABNORMAL HIGH (ref 0–40)

## 2013-12-28 LAB — CBC WITH DIFFERENTIAL/PLATELET
BASO%: 0.3 % (ref 0.0–2.0)
Basophils Absolute: 0.1 10*3/uL (ref 0.0–0.1)
EOS%: 1 % (ref 0.0–7.0)
Eosinophils Absolute: 0.3 10*3/uL (ref 0.0–0.5)
HCT: 38.3 % (ref 34.8–46.6)
HEMOGLOBIN: 12.3 g/dL (ref 11.6–15.9)
LYMPH%: 76 % — ABNORMAL HIGH (ref 14.0–49.7)
MCH: 30.2 pg (ref 25.1–34.0)
MCHC: 32.1 g/dL (ref 31.5–36.0)
MCV: 94.1 fL (ref 79.5–101.0)
MONO#: 0.9 10*3/uL (ref 0.1–0.9)
MONO%: 3.1 % (ref 0.0–14.0)
NEUT%: 19.6 % — ABNORMAL LOW (ref 38.4–76.8)
NEUTROS ABS: 5.3 10*3/uL (ref 1.5–6.5)
Platelets: 263 10*3/uL (ref 145–400)
RBC: 4.07 10*6/uL (ref 3.70–5.45)
RDW: 14.1 % (ref 11.2–14.5)
WBC: 27.1 10*3/uL — ABNORMAL HIGH (ref 3.9–10.3)
lymph#: 20.6 10*3/uL — ABNORMAL HIGH (ref 0.9–3.3)

## 2013-12-28 LAB — MORPHOLOGY: PLT EST: ADEQUATE

## 2013-12-28 LAB — COMPREHENSIVE METABOLIC PANEL (CC13)
ALT: 8 U/L (ref 0–55)
ANION GAP: 11 meq/L (ref 3–11)
AST: 18 U/L (ref 5–34)
Albumin: 3.4 g/dL — ABNORMAL LOW (ref 3.5–5.0)
Alkaline Phosphatase: 67 U/L (ref 40–150)
BILIRUBIN TOTAL: 0.29 mg/dL (ref 0.20–1.20)
BUN: 15.1 mg/dL (ref 7.0–26.0)
CO2: 28 meq/L (ref 22–29)
Calcium: 9.6 mg/dL (ref 8.4–10.4)
Chloride: 100 mEq/L (ref 98–109)
Creatinine: 0.8 mg/dL (ref 0.6–1.1)
Glucose: 130 mg/dl (ref 70–140)
Potassium: 3.6 mEq/L (ref 3.5–5.1)
Sodium: 139 mEq/L (ref 136–145)
Total Protein: 6.9 g/dL (ref 6.4–8.3)

## 2013-12-28 LAB — TSH CHCC: TSH: 1.393 m(IU)/L (ref 0.308–3.960)

## 2013-12-28 LAB — LACTATE DEHYDROGENASE (CC13): LDH: 117 U/L — ABNORMAL LOW (ref 125–245)

## 2013-12-28 LAB — CHCC SMEAR

## 2013-12-28 NOTE — Telephone Encounter (Signed)
GAve pt appt for lab and MD for June 2015 °

## 2013-12-29 NOTE — Progress Notes (Signed)
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 female was transferred to my care after her prior physician has left.  I reviewed the patient's records extensive and collaborated the history with the patient. Summary of her history is as follows: Her original diagnosis was in July 2013. Some initial difficulty in establishing the diagnosis in view of a history of chronic psoriasis. She was referred to Dr. 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 She recommended a trial of PUVA plus interferon. The patient's medical condition was unstable at that time and after discussing the potential side effects of this regimen she elected not to go on the interferon-based treatment. A trial of Targretin was started in January 2014. She had a number of major medical problems over the last year, most dramatic of which was necessity for a right above the knee amputation for osteomyelitis.  She had a nonhealing, chronic wound, on her right ankle and was closely followed by orthopedic surgery. She had a syncopal episode and was hospitalized on 10/05/2012. Etiology of the syncope was not clear and symptoms resolved. However while hospitalized, she was reevaluated for her ankle wound by her orthopedist. She eventually had to have a right above the knee amputation for findings of osteomyelitis. She had another admission for decompensated congestive heart failure in  February. She likely suffered a stroke at that time. This was felt to be secondary to known atrial fibrillation. She was already on aspirin. Plavix initially added but neurologist recommended stopping the Plavix and putting her on Pradaxa. Full dose Pradaxa 150 mg twice daily started around 12/05/2012. This essentially day derailed any consideration for PUVA treatments from a practical point of view. She had to move to a skilled care facility. The Targretin was initially started a dose of 75 mg daily. Dose was escalated at the end of March, 2014, to 150 mg. She has tolerated the drug well. She has  adenopathy  limited to her axillae. By 03/20/2013 visit I felt that she was showing a moderate response to the drug. There was a significant decrease in erythema and desquamation. At time of her visit on August 26, multiple new papules noted on the skin of her trunk.  Due to her age and now handicapped status, she did not want to go to [facility] for an evaluation for electron beam radiation therapy. She was therefore evaluated here in [facility] by Dr. Chipper Herb. He didn't think that the chest papules were significant enough to initiate a course of radiation. At time of her visit here on December 30, she had just been discharged from the hospital with an episode of heart failure. \  The patient is currently recovering from an episode of pneumonia. She is on antibiotics recently. She denies any skin desquamation. She still has open sores in her left leg occasionally but according to the daughter they are healing well. She cannot appreciate lymphadenopathy that was palpable by her prior oncologist. Denies any recent exacerbation of congestive heart failure. No bleeding complications from her anticoagulation therapy.  MEDICAL HISTORY:  Past Medical History  Diagnosis Date  . Hypertension   . Atrial fibrillation   . Psoriasis   . Congestive heart failure (CHF)   . History of CVA (cerebrovascular accident)    . Coronary artery disease   . Myocardial infarction   . CHF (congestive heart failure)   . Shortness of breath   . H/O hiatal hernia   . Closed fracture of head of left humerus 5/12  . Basal cell carcinoma   . 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  Flow cytometry 04/04/12: 91% cells CD4 positive CD 26 negative  . Hyperlipemia   . Pneumonia   . Wound of right leg 09/25/2012    Necrotic, open wound right lower leg; non healing  . Stroke   . Hyposmolality and/or hyponatremia   . Lower limb amputation, above knee 10/11/2012  . Pain in joint, multiple sites   . Mycosis fungoides, unspecified site, extranodal and solid organ sites 09/28/2012  . Occlusion and stenosis of carotid artery without mention of cerebral infarction 09/28/2012  . Chronic venous hypertension with ulcer and inflammation 09/28/2012  . Diverticulosis of colon (without mention of hemorrhage) 09/28/2012  . Synovial cyst of popliteal space 03/26/2012  . Unspecified constipation 03/24/2012  . Hypopotassemia 01/26/2012  . Debility, unspecified 01/17/2012  . Anxiety state, unspecified 12/31/2011  . Anemia, unspecified 12/21/2011  . Other specified disease of white blood cells 12/21/2011  . Peripheral vascular disease, unspecified 12/21/2011    recent ABI Lt of 0.64 down from ).80 on 12/29/11- though different labs  . Reflux esophagitis 12/21/2011  . Unspecified hereditary and idiopathic peripheral neuropathy 12/14/2011  . Pain in joint, ankle and foot 12/14/2011  . Carotid stenosis     CAROTID DOPPLER, 05/02/2012 - LEFT VERTEBRAL-occluded, LEFT BULB AND PROXIMAL ICA STENT-moderate amount of irregular mixed dense plaque 50-69% diameter reduction  . Pre-syncope     NUCLEAR STRESS TEST, 12/10/2009 - normal, EKG negative for ischemia  . TIA (transient ischemic attack)     2D ECHO, 12/05/2012  - EF 60-65%, Severely calcified annulus of the mitral valve with mild-moderate regurgitation, LA moderate-severely dilated  . Paroxysmal atrial fibrillation   . CAD in native artery     3 vessel CAD, medical therapy, not a candidate for CABG  . S/P AKA (above knee amputation) unilateral, Rt leg due to PAD and non healing wound 06/27/2013  . HCAP (healthcare-associated pneumonia) 01/08/2012    SURGICAL HISTORY: Past Surgical History  Procedure Laterality Date  . Tonsillectomy    . Dilation and curettage of uterus    . Eye surgery    . Cataracts    . Vertebroplasty    . Amputation  10/10/2012    Procedure: AMPUTATION ABOVE KNEE;  Surgeon: Newt Minion, MD;  Location: [place];  Service: Orthopedics;  Laterality: Right;  . Percutaneous stent intervention  04/13/2011    Left internal carotid artery stented with a 8x30 prcise nitinol self-expanding stent resulting reduction of 90% to less than 20% residual  . Cardiac catheterization  11/06/2010    CABG recommended, turned down by [facility] secondary to co morbidities    SOCIAL HISTORY: History   Social History  . Marital Status: Widowed    Spouse Name: N/A    Number of Children: 2  . Years of Education: N/A   Occupational History  .  Not on file.   Social History Main Topics  . Smoking status: Never Smoker   . Smokeless tobacco: Never Used  . Alcohol Use: No  . Drug Use: No  . Sexual Activity: No   Other Topics Concern  . Not on file   Social History Narrative  . No narrative on file    FAMILY HISTORY: Family History  Problem Relation Age of Onset  . Stomach cancer Mother   . Pneumonia Father   . Cirrhosis Brother   . Diabetes Brother   . Heart attack Paternal Grandmother   . Heart failure Sister   . Diabetes Sister   . Heart failure Sister     ALLERGIES:  is allergic to evista; penicillins; triamterene; and alendronate sodium.  MEDICATIONS:  Current Outpatient Prescriptions  Medication Sig Dispense Refill  .  atorvastatin (LIPITOR) 40 MG tablet Take 1 tablet (40 mg total) by mouth daily at 6 PM.      . bexarotene (TARGRETIN) 75 MG CAPS capsule Take 2 capsules (150 mg total) by mouth daily before supper. Give with food. Protect from light. CAUTION: Chemotherapy/Biotherapy-Wear gloves when handling  180 capsule  0  . carvedilol (COREG) 12.5 MG tablet Take 1 tablet (12.5 mg total) by mouth 2 (two) times daily with a meal.  60 tablet  0  . dabigatran (PRADAXA) 150 MG CAPS Take 75 mg by mouth every 12 (twelve) hours.       Marland Kitchen diltiazem (CARDIZEM CD) 120 MG 24 hr capsule Take 120 mg by mouth daily. Take 1 tablet once daily for BP.      . famotidine (PEPCID) 20 MG tablet Take 40 mg by mouth daily.      . folic acid (FOLVITE) 1 MG tablet Take 1 mg by mouth daily.      . furosemide (LASIX) 40 MG tablet Take 1.5 tablets (60 mg total) by mouth 2 (two) times daily.  30 tablet  0  . gabapentin (NEURONTIN) 100 MG capsule Take 1 capsule (100 mg total) by mouth at bedtime.      Marland Kitchen guaiFENesin (MUCINEX) 600 MG 12 hr tablet Take by mouth 2 (two) times daily.      Marland Kitchen ipratropium-albuterol (DUONEB) 0.5-2.5 (3) MG/3ML SOLN Take 3 mLs by nebulization every 6 (six) hours as needed.       . isosorbide mononitrate (IMDUR) 30 MG 24 hr tablet Take 1 tablet (30 mg total) by mouth daily.  30 tablet  6  . levocetirizine (XYZAL) 5 MG tablet Take 5 mg by mouth as needed for allergies.      Marland Kitchen lisinopril (PRINIVIL,ZESTRIL) 10 MG tablet Take 10 mg by mouth daily.      . nitroGLYCERIN (NITROSTAT) 0.4 MG SL tablet Place 0.4 mg under the tongue every 5 (five) minutes as needed for chest pain. If no relief call MD.      . Omega-3 Fatty Acids (FISH OIL) 1000 MG CAPS Take 1,000 mg by mouth at bedtime.       . polyethylene glycol (MIRALAX / GLYCOLAX) packet Take 17 g by mouth every other day. Take 1 packet mixed in 4 oz of beverage every day to relieve constipation.      . Polyvinyl Alcohol-Povidone (REFRESH OP) Apply 1 drop to eye 4 (four) times  daily. Instill 1 drop to both eyes four times daily.*Wait 3-5 minutes between eye drops*      . triamcinolone cream (KENALOG) 0.1 % Apply topically as directed.      . vitamin  C (ASCORBIC ACID) 500 MG tablet Take 500 mg by mouth daily.       No current facility-administered medications for this visit.    REVIEW OF SYSTEMS:   Constitutional: Denies fevers, chills or abnormal night sweats Eyes: Denies blurriness of vision, double vision or watery eyes Ears, nose, mouth, throat, and face: Denies mucositis or sore throat Respiratory: Denies cough, dyspnea or wheezes Cardiovascular: Denies palpitation, chest discomfort or lower extremity swelling Gastrointestinal:  Denies nausea, heartburn or change in bowel habits Neurological:Denies numbness, tingling or new weaknesses Behavioral/Psych: Mood is stable, no new changes  All other systems were reviewed with the patient and are negative.  PHYSICAL EXAMINATION: ECOG PERFORMANCE STATUS: 2 - Symptomatic, <50% confined to bed  Filed Vitals:   12/28/13 1439  BP: 93/51  Pulse: 65  Resp: 18   Filed Weights    GENERAL:alert, no distress and comfortable. She is a thin but not cachectic SKIN: Diffuse erythematous skin is noted throughout her body. Occasional plaques are appreciated especially in her lower extremities. She has some mild desquamation of her skin in her hands EYES: normal, conjunctiva are pink and appeared injected sclera clear OROPHARYNX:no exudate, normal lips, buccal mucosa, and tongue  NECK: supple, thyroid normal size, non-tender, without nodularity LYMPH:  Palpable lymphadenopathy in the left axilla region but none in the cervical or inguinal region  LUNGS: clear to auscultation and percussion with normal breathing effort HEART: regular rate & rhythm and no murmurs without lower extremity edema ABDOMEN:abdomen soft, non-tender and normal bowel sounds Musculoskeletal:no cyanosis of digits and no clubbing  PSYCH: alert &  oriented x 3 with fluent speech NEURO: no focal motor/sensory deficits  LABORATORY DATA:  I have reviewed the data as listed Lab Results  Component Value Date   WBC 27.1* 12/28/2013   HGB 12.3 12/28/2013   HCT 38.3 12/28/2013   MCV 94.1 12/28/2013   PLT 263 12/28/2013    Recent Labs  08/28/13 0445 08/29/13 0456 08/30/13 0453 09/11/13 1527 10/23/13 1406 12/22/13 12/28/13 1417  NA 137 139 134* 142 142 140 139  K 3.7 3.7 4.0 3.8 4.0 3.7 3.6  CL 97 98 94*  --   --   --   --   CO2 31 29 29 25  33*  --  28  GLUCOSE 97 97 103* 119 102  --  130  BUN 17 23 30* 12.3 13.6 12 15.1  CREATININE 0.72 0.79 0.85 0.7 0.8 0.6 0.8  CALCIUM 9.5 9.5 9.4 9.4 9.8  --  9.6  GFRNONAA 72* 70* 58*  --   --   --   --   GFRAA 84* 81* 67*  --   --   --   --   PROT  --   --   --  7.3 7.1  --  6.9  ALBUMIN  --   --   --  3.6 3.7  --  3.4*  AST  --   --   --  18 20  --  18  ALT  --   --   --  14 11  --  8  ALKPHOS  --   --   --  72 74  --  67  BILITOT  --   --   --  0.24 0.31  --  0.29   ASSESSMENT & PLAN:  #1 stage IV mycosis fungoides Overall, my impression is that the patient is not responding to her current treatment. According to her recent visit with  her prior oncologist, the lymphadenopathy was slightly worse. The lymphocytosis is progressive even though the total white blood cell count is slightly lower than her prior visit. I recommend second opinion at [facility] (the patient does not want to go back to [facility] due to her current status). I do not think systemic chemotherapy is an option for her due to her frail status and recent infection. Another possibility would be to refer her back to a radiation oncologist for consideration for light therapy. #2 recurrent infection with recent pneumonia She appears to be recovering from that #3 chronic congestive heart failure There is no clinical exacerbation on clinical examination today Orders Placed This Encounter  Procedures  .  Comprehensive metabolic panel    Standing Status: Future     Number of Occurrences:      Standing Expiration Date: 12/28/2014  . CBC with Differential    Standing Status: Future     Number of Occurrences:      Standing Expiration Date: 12/28/2014  . Lactate dehydrogenase    Standing Status: Future     Number of Occurrences:      Standing Expiration Date: 12/28/2014  . Morphology    Standing Status: Future     Number of Occurrences:      Standing Expiration Date: 12/28/2014    All questions were answered. The patient knows to call the clinic with any problems, questions or concerns. I spent 40 minutes counseling the patient face to face. The total time spent in the appointment was 55 minutes and more than 50% was on counseling.     Heath Lark, MD 12/29/2013 8:58 PM

## 2013-12-31 ENCOUNTER — Telehealth: Payer: Self-pay | Admitting: *Deleted

## 2013-12-31 ENCOUNTER — Telehealth: Payer: Self-pay | Admitting: Hematology and Oncology

## 2013-12-31 ENCOUNTER — Encounter: Payer: Self-pay | Admitting: *Deleted

## 2013-12-31 DIAGNOSIS — I6529 Occlusion and stenosis of unspecified carotid artery: Secondary | ICD-10-CM

## 2013-12-31 NOTE — Telephone Encounter (Signed)
Order placed for repeat carotid dopplers in 1 year  

## 2013-12-31 NOTE — Telephone Encounter (Signed)
Pt appt. With Dr. Shirleen Schirmer @ [place] is 01/10/14@2 :83. Medical records faxed, slides and scans will be fedex'ed. Pt's dtr is aware

## 2013-12-31 NOTE — Telephone Encounter (Signed)
Message copied by Chauncy Lean on Mon Dec 31, 2013  7:37 PM ------      Message from: Lorretta Harp      Created: Sun Dec 30, 2013  5:11 PM       No change from prior study. Repeat in 12 months. ------

## 2014-01-07 ENCOUNTER — Telehealth: Payer: Self-pay | Admitting: Cardiovascular Disease

## 2014-01-07 ENCOUNTER — Non-Acute Institutional Stay (SKILLED_NURSING_FACILITY): Payer: Medicare Other | Admitting: Nurse Practitioner

## 2014-01-07 ENCOUNTER — Encounter: Payer: Self-pay | Admitting: Nurse Practitioner

## 2014-01-07 DIAGNOSIS — I10 Essential (primary) hypertension: Secondary | ICD-10-CM

## 2014-01-07 DIAGNOSIS — R609 Edema, unspecified: Secondary | ICD-10-CM

## 2014-01-07 DIAGNOSIS — G546 Phantom limb syndrome with pain: Secondary | ICD-10-CM

## 2014-01-07 DIAGNOSIS — I5032 Chronic diastolic (congestive) heart failure: Secondary | ICD-10-CM

## 2014-01-07 DIAGNOSIS — G547 Phantom limb syndrome without pain: Secondary | ICD-10-CM

## 2014-01-07 DIAGNOSIS — I480 Paroxysmal atrial fibrillation: Secondary | ICD-10-CM

## 2014-01-07 DIAGNOSIS — K5900 Constipation, unspecified: Secondary | ICD-10-CM

## 2014-01-07 DIAGNOSIS — R600 Localized edema: Secondary | ICD-10-CM

## 2014-01-07 DIAGNOSIS — I4891 Unspecified atrial fibrillation: Secondary | ICD-10-CM

## 2014-01-07 DIAGNOSIS — I739 Peripheral vascular disease, unspecified: Secondary | ICD-10-CM

## 2014-01-07 DIAGNOSIS — K219 Gastro-esophageal reflux disease without esophagitis: Secondary | ICD-10-CM

## 2014-01-07 NOTE — Telephone Encounter (Signed)
[facility] called to inform us they are faxing a order over to have Dr. Gwenlyn Found sign in reference to patient's Furosemide and lisinopril. Patient was seen there on Friday 01/04/14 by the Mast Man-NP. Wants Dr. Gwenlyn Found to sign off on the medication changes if he agrees. Informed them once the order is received it will be given to Dr. Gwenlyn Found to review..  ( I went to look for faxed order and it had already been pulled from the fax machine and put in Dr. Kennon Holter box in the clinical area.)

## 2014-01-07 NOTE — Assessment & Plan Note (Signed)
ontrolled on Coreg, Cardizem DC,  Imdur,  and lisinopril for now.

## 2014-01-07 NOTE — Assessment & Plan Note (Signed)
Staff reported worsened edema LLE. It seems the same to me. BNP 364.5 12/22/13. The patient denied worsened SOB, cough, or sputum production. Moist rales at bases the same as piror. Will repeat BNP and monitor the patient. Continue Furosemide 60mg  bid.

## 2014-01-07 NOTE — Assessment & Plan Note (Signed)
Stable, takes Famotidine 40mg daily.    

## 2014-01-07 NOTE — Assessment & Plan Note (Signed)
Right AKA--managed with Gabapentin 100mg nightly 

## 2014-01-07 NOTE — Assessment & Plan Note (Signed)
bradycardic to the 30s and 40s intermittently with occasional pauses up to and just greater than 2 seconds while in hospital. Resolved to 60s after Carvedilol was slightly reduced. Continued anticoagulation with pradaxa and Diltiazem

## 2014-01-07 NOTE — Assessment & Plan Note (Signed)
Takes Furosemide 60mg  bid. BNP 364.5 12/22/13. Compensated clinically except LLE edema. Will repeat BNP

## 2014-01-07 NOTE — Assessment & Plan Note (Signed)
Managed with MiraLax qod   

## 2014-01-07 NOTE — Progress Notes (Signed)
Patient ID: Sara [name], female   DOB: Jun 22, 1919, 80 y.o.   MRN: [id number]   Code Status: DNR  Allergies  Allergen Reactions  . Evista [Raloxifene]   . Penicillins Other (See Comments)    unknown  . Triamterene Other (See Comments)    unknown  . Alendronate Sodium Rash    Pt stated oh k to use biotene    Chief Complaint  Patient presents with  . Medical Management of Chronic Issues  . Acute Visit    LLE edema.     HPI: Patient is a 80 y.o. female seen in the SNF at [facility] today for evaluation of LLE swelling and chronic medical conditions.   Hospitalization from 08/27/2013-08/30/2013 for CHF-Furosemide was increased to 60mg  bid and decreased Carvedilol due to bradycardia. Had proBNP 2534, d-dimer 2.97, chest x-ray with central vascular congestion and bilateral pleural effusion.  CBC showed WBC 32.8. BMP was unremarkable.   Problem List Items Addressed This Visit   Chronic diastolic HF (heart failure) (Chronic)     Takes Furosemide 60mg  bid. BNP 364.5 12/22/13. Compensated clinically except LLE edema. Will repeat BNP    GERD (gastroesophageal reflux disease)     Stable, takes Famotidine 40mg  daily.        HTN (hypertension)     ontrolled on Coreg, Cardizem DC,  Imdur,  and lisinopril for now.       Leg edema, left - Primary     Staff reported worsened edema LLE. It seems the same to me. BNP 364.5 12/22/13. The patient denied worsened SOB, cough, or sputum production. Moist rales at bases the same as piror. Will repeat BNP and monitor the patient. Continue Furosemide 60mg  bid.     PAD (peripheral artery disease)- LICA stent 5/42 (Chronic)     F/u Cardiology.     PAF (paroxysmal atrial fibrillation)     bradycardic to the 30s and 40s intermittently with occasional pauses up to and just greater than 2 seconds while in hospital. Resolved to 60s after Carvedilol was slightly reduced. Continued anticoagulation with pradaxa and Diltiazem      Phantom limb  pain (Chronic)     Right AKA--managed with Gabapentin 100mg  nightly.        Unspecified constipation     Managed with MiraLax qod         Review of Systems:  Review of Systems  Constitutional: Negative for fever, chills, weight loss, malaise/fatigue and diaphoresis.  HENT: Positive for hearing loss. Negative for congestion, ear discharge, ear pain and sore throat.   Eyes: Negative for pain, discharge and redness.  Respiratory: Positive for cough. Negative for sputum production, shortness of breath and wheezing.        DOE  Cardiovascular: Positive for leg swelling. Negative for chest pain, palpitations, orthopnea, claudication and PND.       Slightly worse 1+  Gastrointestinal: Negative for heartburn, nausea, vomiting, abdominal pain, diarrhea, constipation and blood in stool.  Genitourinary: Positive for frequency (incontinent of bladder). Negative for dysuria, urgency, hematuria and flank pain.  Musculoskeletal: Positive for joint pain. Negative for back pain, falls, myalgias and neck pain.  Skin: Positive for itching and rash.       Chronic psoriasis, very thin and red skin, frequent skin abrasion or tear. Left wrist wart appearance skin lesion.   Neurological: Negative for dizziness, tingling, tremors, sensory change, speech change, focal weakness, seizures, loss of consciousness, weakness (generalized) and headaches.  Endo/Heme/Allergies: Negative for environmental allergies and polydipsia.  Bruises/bleeds easily.  Psychiatric/Behavioral: Positive for memory loss. Negative for depression and hallucinations. The patient is not nervous/anxious and does not have insomnia.      Past Medical History  Diagnosis Date  . Hypertension   . Atrial fibrillation   . Psoriasis   . Congestive heart failure (CHF)   . History of CVA (cerebrovascular accident)   . Coronary artery disease   . Myocardial infarction   . CHF (congestive heart failure)   . Shortness of breath   . H/O hiatal  hernia   . Closed fracture of head of left humerus 5/12  . Basal cell carcinoma   . 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  Flow cytometry 04/04/12: 91% cells CD4 positive CD 26 negative  . Hyperlipemia   . Pneumonia   . Wound of right leg 09/25/2012    Necrotic, open wound right lower leg; non healing  . Stroke   . Hyposmolality and/or hyponatremia   . Lower limb amputation, above knee 10/11/2012  . Pain in joint, multiple sites   . Mycosis fungoides, unspecified site, extranodal and solid organ sites 09/28/2012  . Occlusion and stenosis of carotid artery without mention of cerebral infarction 09/28/2012  . Chronic venous hypertension with ulcer and inflammation 09/28/2012  . Diverticulosis of colon (without mention of hemorrhage) 09/28/2012  . Synovial cyst of popliteal space 03/26/2012  . Unspecified constipation 03/24/2012  . Hypopotassemia 01/26/2012  . Debility, unspecified 01/17/2012  . Anxiety state, unspecified 12/31/2011  . Anemia, unspecified 12/21/2011  . Other specified disease of white blood cells 12/21/2011  . Peripheral vascular disease, unspecified 12/21/2011    recent ABI Lt of 0.64 down from ).80 on 12/29/11- though different labs  . Reflux esophagitis 12/21/2011  . Unspecified hereditary and idiopathic peripheral neuropathy 12/14/2011  . Pain in joint, ankle and foot 12/14/2011  . Carotid stenosis     CAROTID DOPPLER, 05/02/2012 - LEFT VERTEBRAL-occluded, LEFT BULB AND PROXIMAL ICA STENT-moderate amount of irregular mixed dense plaque 50-69% diameter reduction  . Pre-syncope     NUCLEAR STRESS TEST, 12/10/2009 - normal, EKG negative for ischemia  . TIA (transient ischemic attack)     2D ECHO, 12/05/2012 - EF 60-65%, Severely calcified annulus of the mitral valve with mild-moderate regurgitation, LA moderate-severely dilated  .  Paroxysmal atrial fibrillation   . CAD in native artery     3 vessel CAD, medical therapy, not a candidate for CABG  . S/P AKA (above knee amputation) unilateral, Rt leg due to PAD and non healing wound 06/27/2013  . HCAP (healthcare-associated pneumonia) 01/08/2012   Past Surgical History  Procedure Laterality Date  . Tonsillectomy    . Dilation and curettage of uterus    . Eye surgery    . Cataracts    . Vertebroplasty    . Amputation  10/10/2012    Procedure: AMPUTATION ABOVE KNEE;  Surgeon: Nadara Mustard, MD;  Location: [facility] OR;  Service: Orthopedics;  Laterality: Right;  . Percutaneous stent intervention  04/13/2011    Left internal carotid artery stented with a 8x30 prcise nitinol self-expanding stent resulting reduction of 90% to less than 20% residual  . Cardiac catheterization  11/06/2010    CABG recommended, turned down by [facility] secondary to co morbidities   Social History:   reports that she has never smoked. She has never used smokeless tobacco.  She reports that she does not drink alcohol or use illicit drugs.  Family History  Problem Relation Age of Onset  . Stomach cancer Mother   . Pneumonia Father   . Cirrhosis Brother   . Diabetes Brother   . Heart attack Paternal Grandmother   . Heart failure Sister   . Diabetes Sister   . Heart failure Sister     Medications: Patient's Medications  New Prescriptions   No medications on file  Previous Medications   ATORVASTATIN (LIPITOR) 40 MG TABLET    Take 1 tablet (40 mg total) by mouth daily at 6 PM.   BEXAROTENE (TARGRETIN) 75 MG CAPS CAPSULE    Take 2 capsules (150 mg total) by mouth daily before supper. Give with food. Protect from light. CAUTION: Chemotherapy/Biotherapy-Wear gloves when handling   CARVEDILOL (COREG) 12.5 MG TABLET    Take 1 tablet (12.5 mg total) by mouth 2 (two) times daily with a meal.   DABIGATRAN (PRADAXA) 150 MG CAPS    Take 75 mg by mouth every 12 (twelve) hours.    DILTIAZEM (CARDIZEM CD) 120 MG  24 HR CAPSULE    Take 120 mg by mouth daily. Take 1 tablet once daily for BP.   FAMOTIDINE (PEPCID) 20 MG TABLET    Take 40 mg by mouth daily.   FOLIC ACID (FOLVITE) 1 MG TABLET    Take 1 mg by mouth daily.   FUROSEMIDE (LASIX) 40 MG TABLET    Take 1.5 tablets (60 mg total) by mouth 2 (two) times daily.   GABAPENTIN (NEURONTIN) 100 MG CAPSULE    Take 1 capsule (100 mg total) by mouth at bedtime.   GUAIFENESIN (MUCINEX) 600 MG 12 HR TABLET    Take by mouth 2 (two) times daily.   IPRATROPIUM-ALBUTEROL (DUONEB) 0.5-2.5 (3) MG/3ML SOLN    Take 3 mLs by nebulization every 6 (six) hours as needed.    ISOSORBIDE MONONITRATE (IMDUR) 30 MG 24 HR TABLET    Take 1 tablet (30 mg total) by mouth daily.   LEVOCETIRIZINE (XYZAL) 5 MG TABLET    Take 5 mg by mouth as needed for allergies.   LISINOPRIL (PRINIVIL,ZESTRIL) 10 MG TABLET    Take 10 mg by mouth daily.   NITROGLYCERIN (NITROSTAT) 0.4 MG SL TABLET    Place 0.4 mg under the tongue every 5 (five) minutes as needed for chest pain. If no relief call MD.   OMEGA-3 FATTY ACIDS (FISH OIL) 1000 MG CAPS    Take 1,000 mg by mouth at bedtime.    POLYETHYLENE GLYCOL (MIRALAX / GLYCOLAX) PACKET    Take 17 g by mouth every other day. Take 1 packet mixed in 4 oz of beverage every day to relieve constipation.   POLYVINYL ALCOHOL-POVIDONE (REFRESH OP)    Apply 1 drop to eye 4 (four) times daily. Instill 1 drop to both eyes four times daily.*Wait 3-5 minutes between eye drops*   TRIAMCINOLONE CREAM (KENALOG) 0.1 %    Apply topically as directed.   VITAMIN C (ASCORBIC ACID) 500 MG TABLET    Take 500 mg by mouth daily.  Modified Medications   No medications on file  Discontinued Medications   No medications on file     Physical Exam: Physical Exam  Constitutional: She is oriented to person, place, and time. She appears well-developed and well-nourished. No distress.  HENT:  Head: Normocephalic.  Right Ear: External ear normal.  Left Ear: External ear normal.    Nose: Nose normal.  Mouth/Throat: Oropharynx is clear and moist. No oropharyngeal exudate.  Eyes: EOM are normal. Pupils are equal, round, and reactive to light. Right conjunctiva is injected.  Only a few eyelashes remained and right conjunctiva mild irritated with bilateral lower eyelid eversion.   Neck: Normal range of motion. Neck supple. JVD present. No thyromegaly present.  Cardiovascular: Normal rate, regular rhythm and normal heart sounds.  Exam reveals decreased pulses.   No murmur heard. Pulses:      Dorsalis pedis pulses are 1+ on the left side.       Posterior tibial pulses are 1+ on the left side.  Pulmonary/Chest: Effort normal and breath sounds normal. No respiratory distress. She has no wheezes. She exhibits no tenderness.  Decreased breath sounds posterior mid lungs.   Abdominal: Soft. Bowel sounds are normal. She exhibits no distension. There is no tenderness.  Genitourinary:  Incontinent of bladder  Musculoskeletal: Normal range of motion. She exhibits edema. She exhibits no tenderness.  R AKA. Left lower leg edema appears worse 1+  Lymphadenopathy:    She has no cervical adenopathy.  Neurological: She is alert and oriented to person, place, and time. She has normal reflexes. No cranial nerve deficit. She exhibits normal muscle tone. Coordination normal.  Skin: Rash noted. There is erythema.  Diffused erythematous scaly skin appearance. Psoriasis. Left wrist wart appearance skin lesion--f/u Dermatology.   Psychiatric: She has a normal mood and affect. Her behavior is normal. Judgment and thought content normal. She exhibits abnormal recent memory.    Filed Vitals:   01/07/14 1455  BP: 144/74  Pulse: 72  Temp: 97 F (36.1 C)  TempSrc: Tympanic  Resp: 18      Labs reviewed: Basic Metabolic Panel:  Recent Labs  08/02/13  08/27/13 1835 08/28/13 0445 08/29/13 0456 08/30/13 0453 09/11/13 1527 09/11/13 1527 10/23/13 1406 12/22/13 12/28/13 1417  12/28/13 1417  NA 140  < >  --  137 139 134*  --  142 142 140  --  139  K 4.3  < >  --  3.7 3.7 4.0  --  3.8 4.0 3.7  --  3.6  CL  --   < >  --  97 98 94*  --   --   --   --   --   --   CO2  --   < >  --  31 29 29   --  25 33*  --   --  28  GLUCOSE  --   < >  --  97 97 103*  --  119 102  --   --  130  BUN 13  < >  --  17 23 30*  --  12.3 13.6 12  --  15.1  CREATININE 0.6  < >  --  0.72 0.79 0.85  --  0.7 0.8 0.6  --  0.8  CALCIUM  --   < >  --  9.5 9.5 9.4  --  9.4 9.8  --   --  9.6  MG  --   --  2.0  --   --   --   --   --   --   --   --   --   TSH 0.99  --   --   --   --   --  1.009  --   --   --  1.393  --   < > = values in this interval not displayed. Liver Function  Tests:  Recent Labs  09/11/13 1527 10/23/13 1406 12/28/13 1417  AST 18 20 18   ALT 14 11 8   ALKPHOS 72 74 67  BILITOT 0.24 0.31 0.29  PROT 7.3 7.1 6.9  ALBUMIN 3.6 3.7 3.4*    Recent Labs  07/24/13 1415 09/11/13 1529  LIPASE 20 29  AMYLASE 20 22   CBC:  Recent Labs  09/11/13 1527 10/23/13 1406 12/28/13 1417  WBC 33.0* 28.2* 27.1*  NEUTROABS 7.5* 5.8 5.3  HGB 13.2 13.6 12.3  HCT 40.7 41.7 38.3  MCV 94.4 93.0 94.1  PLT 298 239 263   Lipid Panel:  Recent Labs  08/02/13 09/11/13 1529 12/28/13 1419  CHOL  --   --  220*  HDL  --   --  30*  LDLCALC  --   --  134*  TRIG 188* 303* 281*  CHOLHDL  --   --  7.3    Past Procedures:  Dg Chest Port 1 View  08/30/2013 CLINICAL DATA: History of CHF with worsening. EXAM: PORTABLE CHEST - 1 VIEW  IMPRESSION: The findings are consistent with the CHF with evidence of some mild improvement in the interstitial edema since the earlier study.   Dg Chest Portable 1 View  08/27/2013 CLINICAL DATA: Cough, congestion EXAM: PORTABLE CHEST - 1 VIEW IMPRESSION: Central vascular congestion without convincing pulmonary edema. Bilateral small pleural effusion with bilateral basilar atelectasis or infiltrate.  12/20/13 CXR no cardiomegaly or pulmonary vascular  congestion, small bilateral pleural effusions unchanged, patchy bibasilar atelectasis or pneumonia worse on the right and unchanged on the left, old granulomatous disease re demonstrated.   Assessment/Plan Leg edema, left Staff reported worsened edema LLE. It seems the same to me. BNP 364.5 12/22/13. The patient denied worsened SOB, cough, or sputum production. Moist rales at bases the same as piror. Will repeat BNP and monitor the patient. Continue Furosemide 60mg  bid.   HTN (hypertension) ontrolled on Coreg, Cardizem DC,  Imdur,  and lisinopril for now.     GERD (gastroesophageal reflux disease) Stable, takes Famotidine 40mg  daily.      Chronic diastolic HF (heart failure) Takes Furosemide 60mg  bid. BNP 364.5 12/22/13. Compensated clinically except LLE edema. Will repeat BNP  Unspecified constipation Managed with MiraLax qod    PAF (paroxysmal atrial fibrillation) bradycardic to the 30s and 40s intermittently with occasional pauses up to and just greater than 2 seconds while in hospital. Resolved to 60s after Carvedilol was slightly reduced. Continued anticoagulation with pradaxa and Diltiazem    Phantom limb pain Right AKA--managed with Gabapentin 100mg  nightly.      PAD (peripheral artery disease)- LICA stent 4/25 F/u Cardiology.     Family/ Staff Communication: observe the patient  Goals of Care: SNF  Labs/tests ordered: BNP

## 2014-01-07 NOTE — Telephone Encounter (Signed)
Calling to get clarification on medication Lisinopril and Lasik.Sara [name] Please Call

## 2014-01-07 NOTE — Assessment & Plan Note (Signed)
F/u Cardiology 

## 2014-01-09 ENCOUNTER — Telehealth: Payer: Self-pay | Admitting: Cardiovascular Disease

## 2014-01-09 NOTE — Telephone Encounter (Signed)
Sara [name] called. She states need clarification on an order that was written on 01/03/14 D/C Lisinopril 10 mg and start lisinopril 5 mg .  RN spoke to Dr Gwenlyn Found 's nurse Curt Bears. Curt Bears had Dr Gwenlyn Found clarified/ sign order. RN faxed order notified Sara [name].

## 2014-01-17 ENCOUNTER — Telehealth: Payer: Self-pay | Admitting: Hematology and Oncology

## 2014-01-17 NOTE — Telephone Encounter (Signed)
I spoke with Dr. Dellis Filbert from [facility]. Dr. Dellis Filbert suggested that we increase the dose of Targretin gently to see if we can get better response. I spoke with the patient's daughter. She requested that I directed a letter to the nursing home to request dose increment of Targretin and to have her labs monitored in about 3 weeks. The daughter will call tomorrow to get the information of the nursing home.

## 2014-01-18 ENCOUNTER — Telehealth: Payer: Self-pay | Admitting: *Deleted

## 2014-01-18 DIAGNOSIS — C8404 Mycosis fungoides, lymph nodes of axilla and upper limb: Secondary | ICD-10-CM

## 2014-01-18 MED ORDER — BEXAROTENE 75 MG PO CAPS: 225.0000 mg | ORAL_CAPSULE | Freq: Every day | ORAL | Status: DC

## 2014-01-18 NOTE — Telephone Encounter (Signed)
Telephone order from Dr. Alvy Bimler to increase Targretin to 225 mg daily and repeat CBC and CMET on 5/28.  S/w Charity, LPN at [facility] and informed her of new order and faxed over Rx w/ above orders to fax [phone].  Attempted to call daughter back to inform her of new orders, but unable to reach and no VM available on home or cell phones listed.

## 2014-01-18 NOTE — Telephone Encounter (Signed)
Dau left VM states contact number for Nurse at [facility] is [phone], ext. [phone] and fax [phone].  She requests increase in dose of Targretin be faxed to them.

## 2014-01-30 ENCOUNTER — Telehealth: Payer: Self-pay | Admitting: *Deleted

## 2014-01-30 NOTE — Telephone Encounter (Signed)
[facility] called to confirm Targretin order since the Rx was co-signed.  Verbal order given, confirmed dose 225 mg daily.  They will ship medication.

## 2014-02-06 ENCOUNTER — Non-Acute Institutional Stay (SKILLED_NURSING_FACILITY): Payer: Medicare Other | Admitting: Nurse Practitioner

## 2014-02-06 DIAGNOSIS — I2510 Atherosclerotic heart disease of native coronary artery without angina pectoris: Secondary | ICD-10-CM

## 2014-02-06 DIAGNOSIS — I480 Paroxysmal atrial fibrillation: Secondary | ICD-10-CM

## 2014-02-06 DIAGNOSIS — I10 Essential (primary) hypertension: Secondary | ICD-10-CM

## 2014-02-06 DIAGNOSIS — I509 Heart failure, unspecified: Secondary | ICD-10-CM

## 2014-02-06 DIAGNOSIS — I5033 Acute on chronic diastolic (congestive) heart failure: Secondary | ICD-10-CM

## 2014-02-06 DIAGNOSIS — G546 Phantom limb syndrome with pain: Secondary | ICD-10-CM

## 2014-02-06 DIAGNOSIS — G547 Phantom limb syndrome without pain: Secondary | ICD-10-CM

## 2014-02-06 DIAGNOSIS — I4891 Unspecified atrial fibrillation: Secondary | ICD-10-CM

## 2014-02-06 DIAGNOSIS — K5900 Constipation, unspecified: Secondary | ICD-10-CM

## 2014-02-06 DIAGNOSIS — K219 Gastro-esophageal reflux disease without esophagitis: Secondary | ICD-10-CM

## 2014-02-07 ENCOUNTER — Encounter: Payer: Self-pay | Admitting: Nurse Practitioner

## 2014-02-07 ENCOUNTER — Encounter: Payer: Self-pay | Admitting: *Deleted

## 2014-02-07 LAB — BASIC METABOLIC PANEL
BUN: 19 mg/dL (ref 4–21)
CREATININE: 0.6 mg/dL (ref 0.5–1.1)
Glucose: 88 mg/dL
Potassium: 3.7 mmol/L (ref 3.4–5.3)
SODIUM: 138 mmol/L (ref 137–147)

## 2014-02-07 LAB — CBC AND DIFFERENTIAL
HCT: 38 % (ref 36–46)
Hemoglobin: 13 g/dL (ref 12.0–16.0)
PLATELETS: 260 10*3/uL (ref 150–399)
WBC: 32.5 10^3/mL

## 2014-02-07 LAB — HEPATIC FUNCTION PANEL
ALK PHOS: 52 U/L (ref 25–125)
ALT: 10 U/L (ref 7–35)
AST: 17 U/L (ref 13–35)
Bilirubin, Total: 0.3 mg/dL

## 2014-02-07 NOTE — Assessment & Plan Note (Signed)
F/u Cardiology. Takes Isosorbide 30mg daily. No reported angina.   

## 2014-02-07 NOTE — Assessment & Plan Note (Signed)
radycardic to the 30s and 40s intermittently with occasional pauses up to and just greater than 2 seconds while in hospital. Resolved to 60s after Carvedilol was slightly reduced. Continued anticoagulation with pradaxa and Diltiazem 120mg  daily

## 2014-02-07 NOTE — Assessment & Plan Note (Signed)
Right AKA--managed with Gabapentin 100mg nightly 

## 2014-02-07 NOTE — Assessment & Plan Note (Signed)
controlled on Coreg, Cardizem DC,  Imdur,  and lisinopril for now.

## 2014-02-07 NOTE — Assessment & Plan Note (Signed)
akes Furosemide 60mg  bid. BNP 364.5 12/22/13 and 376.4 01/08/14. Compensated clinically except LLE edema.

## 2014-02-07 NOTE — Progress Notes (Signed)
Patient ID: Sara [name], female   DOB: May 16, 1919, 80 y.o.   MRN: [id number]   Code Status: DNR  Allergies  Allergen Reactions  . Evista [Raloxifene]   . Penicillins Other (See Comments)    unknown  . Triamterene Other (See Comments)    unknown  . Alendronate Sodium Rash    Pt stated oh k to use biotene    Chief Complaint  Patient presents with  . Medical Management of Chronic Issues    HPI: Patient is a 80 y.o. female seen in the SNF at [facility] today for evaluation of chronic medical conditions.   Hospitalization from 08/27/2013-08/30/2013 for CHF-Furosemide was increased to 60mg  bid and decreased Carvedilol due to bradycardia. Had proBNP 2534, d-dimer 2.97, chest x-ray with central vascular congestion and bilateral pleural effusion.  CBC showed WBC 32.8. BMP was unremarkable.   Problem List Items Addressed This Visit   Acute on chronic diastolic CHF (congestive heart failure) - Primary     akes Furosemide 60mg  bid. BNP 364.5 12/22/13 and 376.4 01/08/14. Compensated clinically except LLE edema.    CAD- 3V March 2012- medical Rx (Chronic)     F/u Cardiology. Takes Isosorbide 30mg  daily. No reported angina.     GERD (gastroesophageal reflux disease)     Stable, takes Famotidine 40mg  daily.      HTN (hypertension)     controlled on Coreg, Cardizem DC,  Imdur,  and lisinopril for now.       PAF (paroxysmal atrial fibrillation)     radycardic to the 30s and 40s intermittently with occasional pauses up to and just greater than 2 seconds while in hospital. Resolved to 60s after Carvedilol was slightly reduced. Continued anticoagulation with pradaxa and Diltiazem 120mg  daily       Phantom limb pain (Chronic)     Right AKA--managed with Gabapentin 100mg  nightly.       Unspecified constipation     Managed with MiraLax qod        Review of Systems:  Review of Systems  Constitutional: Negative for fever, chills, weight loss, malaise/fatigue and diaphoresis.   HENT: Positive for hearing loss. Negative for congestion, ear discharge, ear pain and sore throat.   Eyes: Negative for pain, discharge and redness.  Respiratory: Positive for cough. Negative for sputum production, shortness of breath and wheezing.        DOE  Cardiovascular: Positive for leg swelling. Negative for chest pain, palpitations, orthopnea, claudication and PND.       Slightly worse 1+  Gastrointestinal: Negative for heartburn, nausea, vomiting, abdominal pain, diarrhea, constipation and blood in stool.  Genitourinary: Positive for frequency (incontinent of bladder). Negative for dysuria, urgency, hematuria and flank pain.  Musculoskeletal: Positive for joint pain. Negative for back pain, falls, myalgias and neck pain.  Skin: Positive for itching and rash.       Chronic psoriasis, very thin and red skin, frequent skin abrasion or tear. Left wrist wart appearance skin lesion.   Neurological: Negative for dizziness, tingling, tremors, sensory change, speech change, focal weakness, seizures, loss of consciousness, weakness (generalized) and headaches.  Endo/Heme/Allergies: Negative for environmental allergies and polydipsia. Bruises/bleeds easily.  Psychiatric/Behavioral: Positive for memory loss. Negative for depression and hallucinations. The patient is not nervous/anxious and does not have insomnia.      Past Medical History  Diagnosis Date  . Hypertension   . Atrial fibrillation   . Psoriasis   . Congestive heart failure (CHF)   . History of CVA (  cerebrovascular accident)   . Coronary artery disease   . Myocardial infarction   . CHF (congestive heart failure)   . Shortness of breath   . H/O hiatal hernia   . Closed fracture of head of left humerus 5/12  . Basal cell carcinoma   . 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  Flow  cytometry 04/04/12: 91% cells CD4 positive CD 26 negative  . Hyperlipemia   . Pneumonia   . Wound of right leg 09/25/2012    Necrotic, open wound right lower leg; non healing  . Stroke   . Hyposmolality and/or hyponatremia   . Lower limb amputation, above knee 10/11/2012  . Pain in joint, multiple sites   . Mycosis fungoides, unspecified site, extranodal and solid organ sites 09/28/2012  . Occlusion and stenosis of carotid artery without mention of cerebral infarction 09/28/2012  . Chronic venous hypertension with ulcer and inflammation 09/28/2012  . Diverticulosis of colon (without mention of hemorrhage) 09/28/2012  . Synovial cyst of popliteal space 03/26/2012  . Unspecified constipation 03/24/2012  . Hypopotassemia 01/26/2012  . Debility, unspecified 01/17/2012  . Anxiety state, unspecified 12/31/2011  . Anemia, unspecified 12/21/2011  . Other specified disease of white blood cells 12/21/2011  . Peripheral vascular disease, unspecified 12/21/2011    recent ABI Lt of 0.64 down from ).80 on 12/29/11- though different labs  . Reflux esophagitis 12/21/2011  . Unspecified hereditary and idiopathic peripheral neuropathy 12/14/2011  . Pain in joint, ankle and foot 12/14/2011  . Carotid stenosis     CAROTID DOPPLER, 05/02/2012 - LEFT VERTEBRAL-occluded, LEFT BULB AND PROXIMAL ICA STENT-moderate amount of irregular mixed dense plaque 50-69% diameter reduction  . Pre-syncope     NUCLEAR STRESS TEST, 12/10/2009 - normal, EKG negative for ischemia  . TIA (transient ischemic attack)     2D ECHO, 12/05/2012 - EF 60-65%, Severely calcified annulus of the mitral valve with mild-moderate regurgitation, LA moderate-severely dilated  . Paroxysmal atrial fibrillation   . CAD in native artery     3 vessel CAD, medical therapy, not a candidate for CABG  . S/P AKA (above knee amputation) unilateral, Rt leg due to PAD and non healing wound 06/27/2013  . HCAP (healthcare-associated pneumonia) 01/08/2012   Past  Surgical History  Procedure Laterality Date  . Tonsillectomy    . Dilation and curettage of uterus    . Eye surgery    . Cataracts    . Vertebroplasty    . Amputation  10/10/2012    Procedure: AMPUTATION ABOVE KNEE;  Surgeon: Newt Minion, MD;  Location: [facility];  Service: Orthopedics;  Laterality: Right;  . Percutaneous stent intervention  04/13/2011    Left internal carotid artery stented with a 8x30 prcise nitinol self-expanding stent resulting reduction of 90% to less than 20% residual  . Cardiac catheterization  11/06/2010    CABG recommended, turned down by [facility] secondary to co morbidities   Social History:   reports that she has never smoked. She has never used smokeless tobacco. She reports that she does not drink alcohol or use illicit drugs.  Family History  Problem Relation Age of Onset  . Stomach cancer Mother   . Pneumonia Father   . Cirrhosis Brother   . Diabetes Brother   . Heart attack Paternal Grandmother   . Heart failure Sister   . Diabetes Sister   .  Heart failure Sister     Medications: Patient's Medications  New Prescriptions   No medications on file  Previous Medications   ATORVASTATIN (LIPITOR) 40 MG TABLET    Take 1 tablet (40 mg total) by mouth daily at 6 PM.   BEXAROTENE (TARGRETIN) 75 MG CAPS CAPSULE    Take 3 capsules (225 mg total) by mouth daily before supper. Give with food. Protect from light. CAUTION: Chemotherapy/Biotherapy-Wear gloves when handling   CARVEDILOL (COREG) 12.5 MG TABLET    Take 1 tablet (12.5 mg total) by mouth 2 (two) times daily with a meal.   DABIGATRAN (PRADAXA) 150 MG CAPS    Take 75 mg by mouth every 12 (twelve) hours.    DILTIAZEM (CARDIZEM CD) 120 MG 24 HR CAPSULE    Take 120 mg by mouth daily. Take 1 tablet once daily for BP.   FAMOTIDINE (PEPCID) 20 MG TABLET    Take 40 mg by mouth daily.   FOLIC ACID (FOLVITE) 1 MG TABLET    Take 1 mg by mouth daily.   FUROSEMIDE (LASIX) 40 MG TABLET    Take 1.5 tablets (60 mg total) by  mouth 2 (two) times daily.   GABAPENTIN (NEURONTIN) 100 MG CAPSULE    Take 1 capsule (100 mg total) by mouth at bedtime.   GUAIFENESIN (MUCINEX) 600 MG 12 HR TABLET    Take by mouth 2 (two) times daily.   IPRATROPIUM-ALBUTEROL (DUONEB) 0.5-2.5 (3) MG/3ML SOLN    Take 3 mLs by nebulization every 6 (six) hours as needed.    ISOSORBIDE MONONITRATE (IMDUR) 30 MG 24 HR TABLET    Take 1 tablet (30 mg total) by mouth daily.   LEVOCETIRIZINE (XYZAL) 5 MG TABLET    Take 5 mg by mouth as needed for allergies.   LISINOPRIL (PRINIVIL,ZESTRIL) 10 MG TABLET    Take 10 mg by mouth daily.   NITROGLYCERIN (NITROSTAT) 0.4 MG SL TABLET    Place 0.4 mg under the tongue every 5 (five) minutes as needed for chest pain. If no relief call MD.   OMEGA-3 FATTY ACIDS (FISH OIL) 1000 MG CAPS    Take 1,000 mg by mouth at bedtime.    POLYETHYLENE GLYCOL (MIRALAX / GLYCOLAX) PACKET    Take 17 g by mouth every other day. Take 1 packet mixed in 4 oz of beverage every day to relieve constipation.   POLYVINYL ALCOHOL-POVIDONE (REFRESH OP)    Apply 1 drop to eye 4 (four) times daily. Instill 1 drop to both eyes four times daily.*Wait 3-5 minutes between eye drops*   TRIAMCINOLONE CREAM (KENALOG) 0.1 %    Apply topically as directed.   VITAMIN C (ASCORBIC ACID) 500 MG TABLET    Take 500 mg by mouth daily.  Modified Medications   No medications on file  Discontinued Medications   No medications on file     Physical Exam: Physical Exam  Constitutional: She is oriented to person, place, and time. She appears well-developed and well-nourished. No distress.  HENT:  Head: Normocephalic.  Right Ear: External ear normal.  Left Ear: External ear normal.  Nose: Nose normal.  Mouth/Throat: Oropharynx is clear and moist. No oropharyngeal exudate.  Eyes: EOM are normal. Pupils are equal, round, and reactive to light. Right conjunctiva is injected.  Only a few eyelashes remained and right conjunctiva mild irritated with bilateral lower  eyelid eversion.   Neck: Normal range of motion. Neck supple. JVD present. No thyromegaly present.  Cardiovascular: Normal rate, regular rhythm  and normal heart sounds.  Exam reveals decreased pulses.   No murmur heard. Pulses:      Dorsalis pedis pulses are 1+ on the left side.       Posterior tibial pulses are 1+ on the left side.  Pulmonary/Chest: Effort normal and breath sounds normal. No respiratory distress. She has no wheezes. She exhibits no tenderness.  Decreased breath sounds posterior mid lungs.   Abdominal: Soft. Bowel sounds are normal. She exhibits no distension. There is no tenderness.  Genitourinary:  Incontinent of bladder  Musculoskeletal: Normal range of motion. She exhibits edema. She exhibits no tenderness.  R AKA. Left lower leg edema appears worse 1+  Lymphadenopathy:    She has no cervical adenopathy.  Neurological: She is alert and oriented to person, place, and time. She has normal reflexes. No cranial nerve deficit. She exhibits normal muscle tone. Coordination normal.  Skin: Rash noted. There is erythema.  Diffused erythematous scaly skin appearance. Psoriasis. Left wrist wart appearance skin lesion--f/u Dermatology.   Psychiatric: She has a normal mood and affect. Her behavior is normal. Judgment and thought content normal. She exhibits abnormal recent memory.    Filed Vitals:   02/07/14 1312  BP: 128/70  Pulse: 68  Temp: 99.1 F (37.3 C)  TempSrc: Tympanic  Resp: 17      Labs reviewed: Basic Metabolic Panel:  Recent Labs  08/02/13  08/27/13 1835 08/28/13 0445 08/29/13 0456 08/30/13 0453 09/11/13 1527 09/11/13 1527 10/23/13 1406 12/22/13 12/28/13 1417 12/28/13 1417  NA 140  < >  --  137 139 134*  --  142 142 140  --  139  K 4.3  < >  --  3.7 3.7 4.0  --  3.8 4.0 3.7  --  3.6  CL  --   < >  --  97 98 94*  --   --   --   --   --   --   CO2  --   < >  --  31 29 29   --  25 33*  --   --  28  GLUCOSE  --   < >  --  97 97 103*  --  119 102  --    --  130  BUN 13  < >  --  17 23 30*  --  12.3 13.6 12  --  15.1  CREATININE 0.6  < >  --  0.72 0.79 0.85  --  0.7 0.8 0.6  --  0.8  CALCIUM  --   < >  --  9.5 9.5 9.4  --  9.4 9.8  --   --  9.6  MG  --   --  2.0  --   --   --   --   --   --   --   --   --   TSH 0.99  --   --   --   --   --  1.009  --   --   --  1.393  --   < > = values in this interval not displayed. Liver Function Tests:  Recent Labs  09/11/13 1527 10/23/13 1406 12/28/13 1417  AST 18 20 18   ALT 14 11 8   ALKPHOS 72 74 67  BILITOT 0.24 0.31 0.29  PROT 7.3 7.1 6.9  ALBUMIN 3.6 3.7 3.4*    Recent Labs  07/24/13 1415 09/11/13 1529  LIPASE 20 29  AMYLASE 20 22   CBC:  Recent Labs  09/11/13 1527 10/23/13 1406 12/28/13 1417  WBC 33.0* 28.2* 27.1*  NEUTROABS 7.5* 5.8 5.3  HGB 13.2 13.6 12.3  HCT 40.7 41.7 38.3  MCV 94.4 93.0 94.1  PLT 298 239 263   Lipid Panel:  Recent Labs  08/02/13 09/11/13 1529 12/28/13 1419  CHOL  --   --  220*  HDL  --   --  30*  LDLCALC  --   --  134*  TRIG 188* 303* 281*  CHOLHDL  --   --  7.3    Past Procedures:  Dg Chest Port 1 View  08/30/2013 CLINICAL DATA: History of CHF with worsening. EXAM: PORTABLE CHEST - 1 VIEW  IMPRESSION: The findings are consistent with the CHF with evidence of some mild improvement in the interstitial edema since the earlier study.   Dg Chest Portable 1 View  08/27/2013 CLINICAL DATA: Cough, congestion EXAM: PORTABLE CHEST - 1 VIEW IMPRESSION: Central vascular congestion without convincing pulmonary edema. Bilateral small pleural effusion with bilateral basilar atelectasis or infiltrate.  12/20/13 CXR no cardiomegaly or pulmonary vascular congestion, small bilateral pleural effusions unchanged, patchy bibasilar atelectasis or pneumonia worse on the right and unchanged on the left, old granulomatous disease re demonstrated.   Assessment/Plan Acute on chronic diastolic CHF (congestive heart failure) akes Furosemide 60mg  bid. BNP 364.5  12/22/13 and 376.4 01/08/14. Compensated clinically except LLE edema.  Unspecified constipation Managed with MiraLax qod   PAF (paroxysmal atrial fibrillation) radycardic to the 30s and 40s intermittently with occasional pauses up to and just greater than 2 seconds while in hospital. Resolved to 60s after Carvedilol was slightly reduced. Continued anticoagulation with pradaxa and Diltiazem 120mg  daily     CAD- 3V March 2012- medical Rx F/u Cardiology. Takes Isosorbide 30mg  daily. No reported angina.   GERD (gastroesophageal reflux disease) Stable, takes Famotidine 40mg  daily.    HTN (hypertension) controlled on Coreg, Cardizem DC,  Imdur,  and lisinopril for now.     Phantom limb pain Right AKA--managed with Gabapentin 100mg  nightly.       Family/ Staff Communication: observe the patient  Goals of Care: SNF  Labs/tests ordered: none

## 2014-02-07 NOTE — Assessment & Plan Note (Signed)
Stable, takes Famotidine 40mg daily.    

## 2014-02-07 NOTE — Progress Notes (Signed)
CBC and CMET results received from [facility] and forwarded to Dr. Alvy Bimler for review. WBC 32.5.

## 2014-02-07 NOTE — Assessment & Plan Note (Signed)
Managed with MiraLax qod   

## 2014-02-08 IMAGING — CR DG FEMUR 2+V*R*
4 series · 4 of 4 positions shown · non-contrast
Comparison: Pelvis 10/05/2012

CLINICAL DATA: Right thigh pain.

RIGHT FEMUR - 2 VIEW

[t femur with hip  ap right]
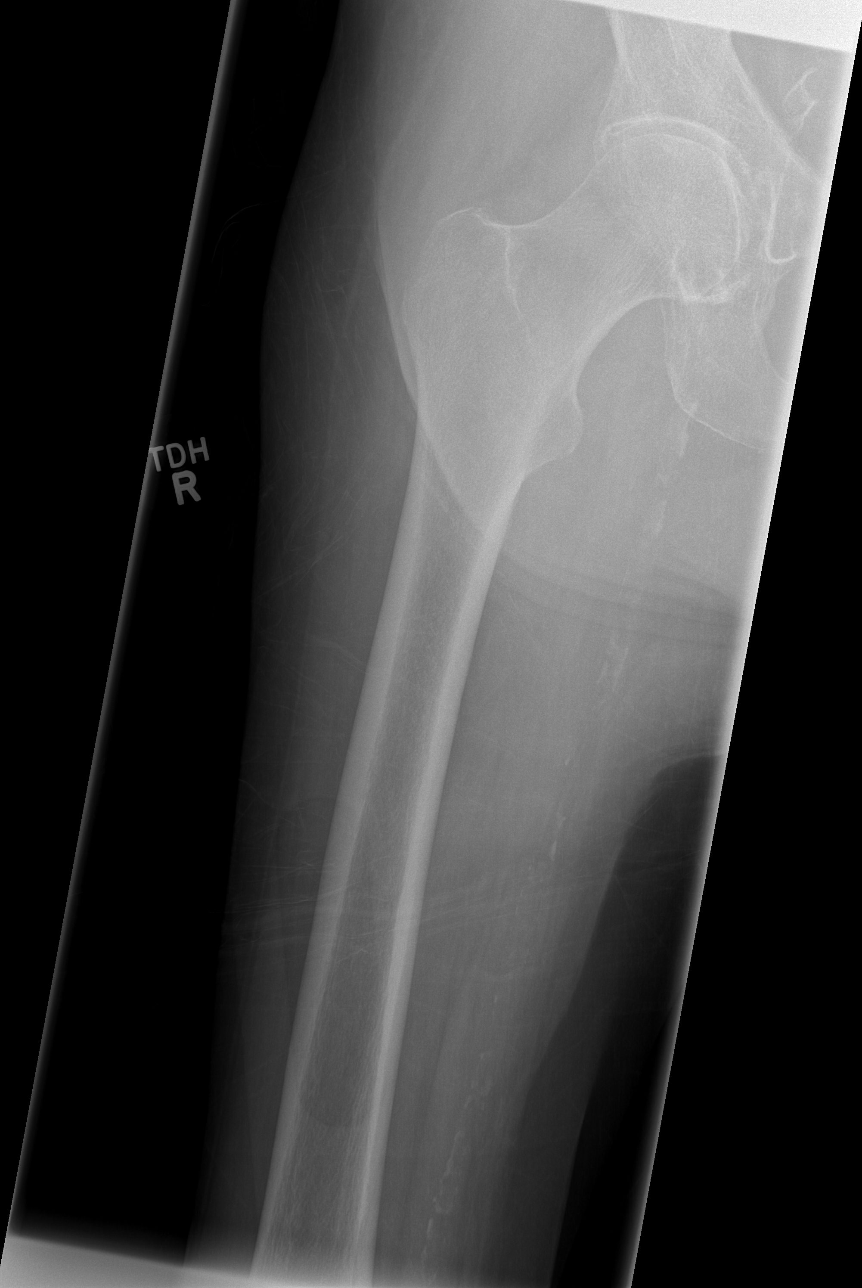

[t femur with knee ap right]
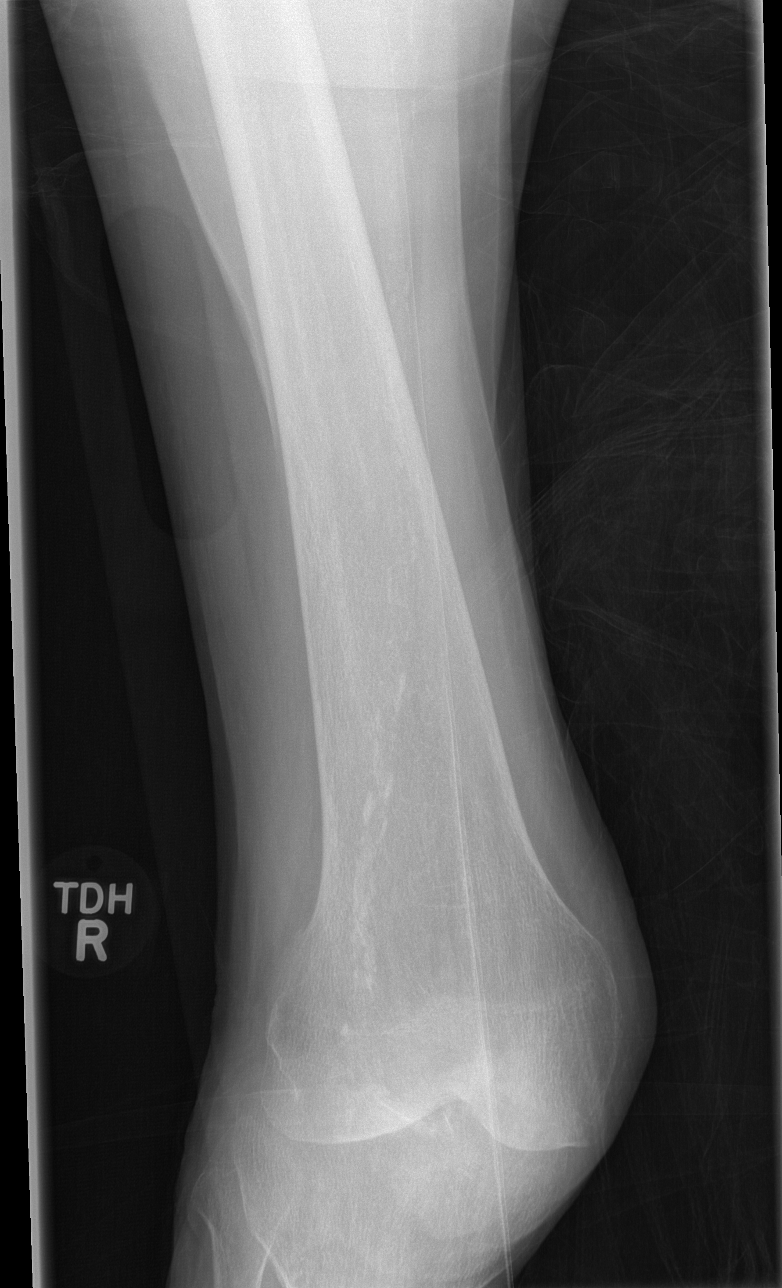

[t femur with hip lat right]
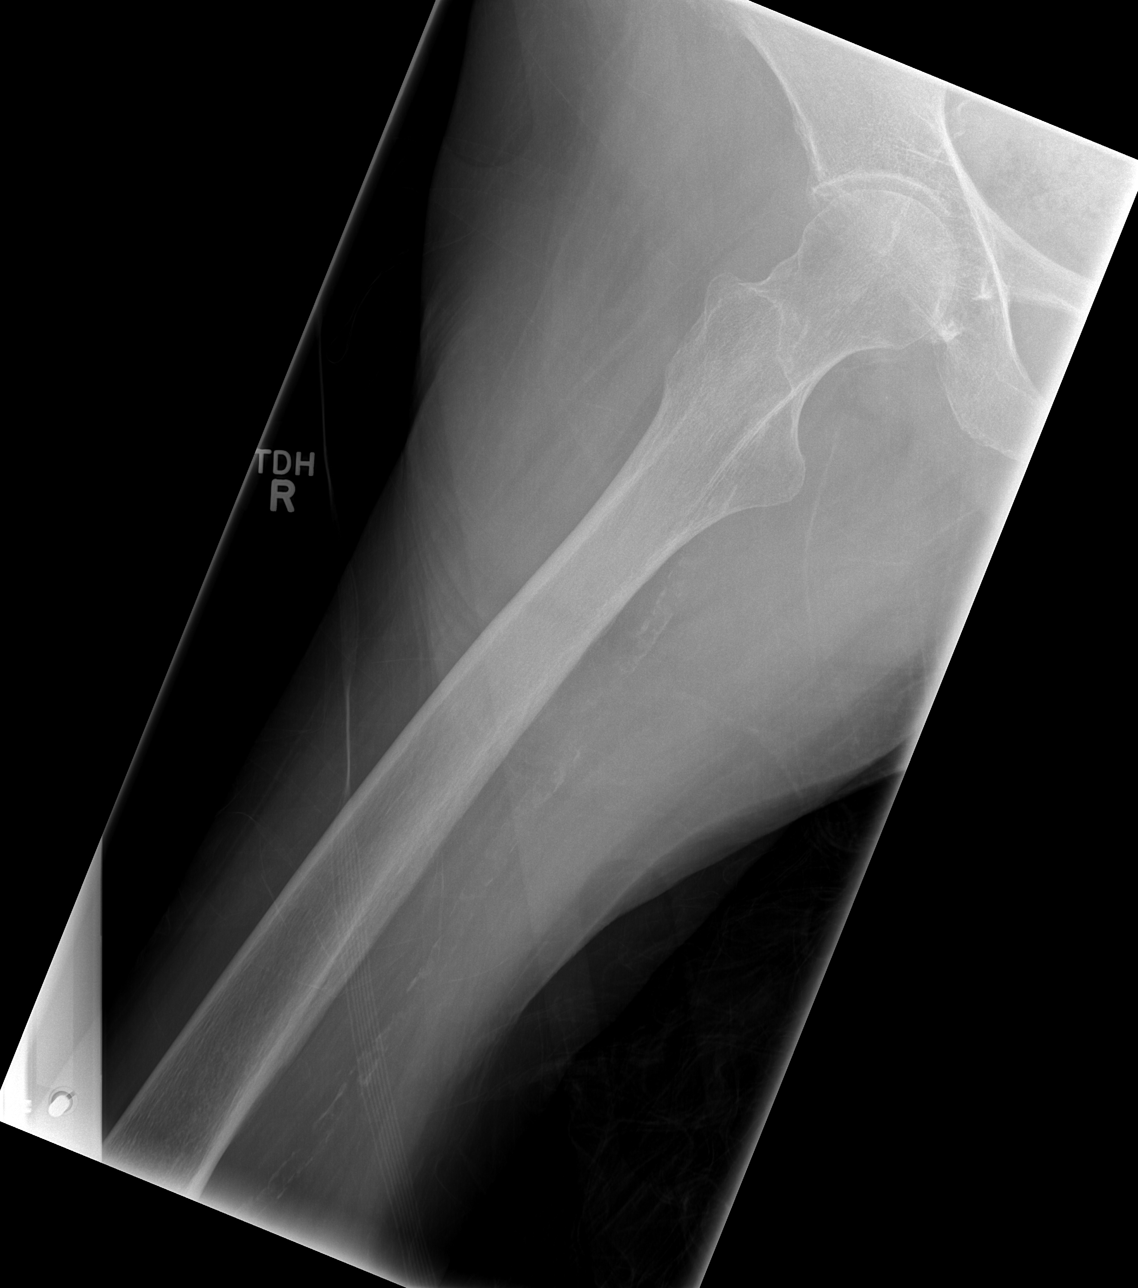

[t femur with knee lat right]
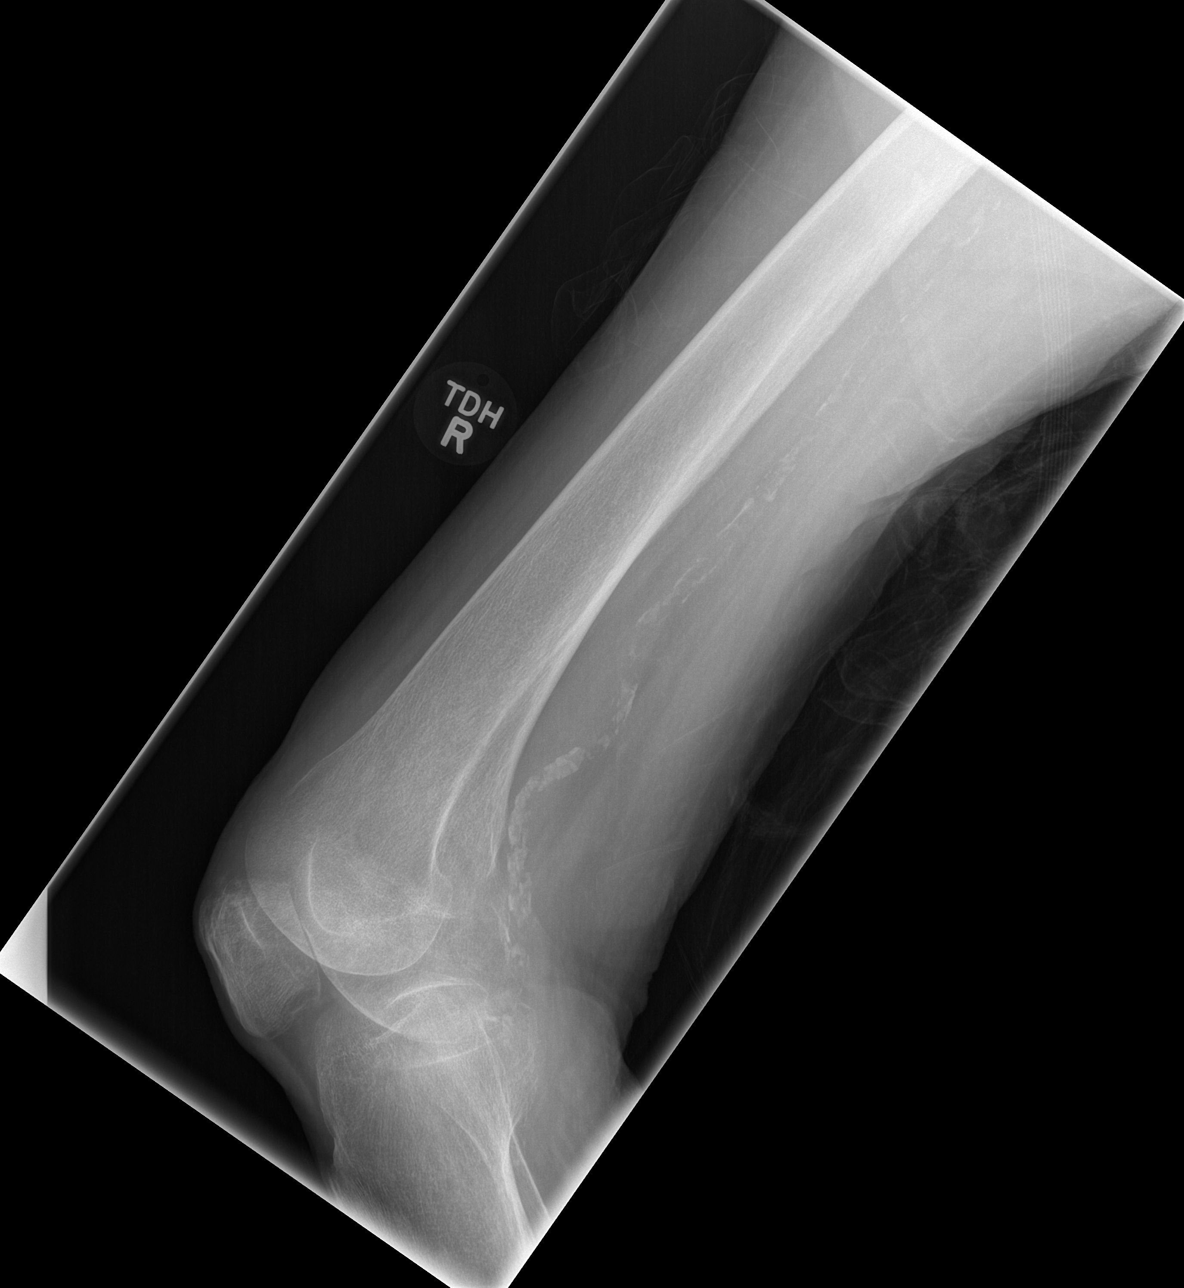

[4 of 4 positions shown; findings below may reference images not displayed]

FINDINGS: Two views of the right femur were obtained.  No evidence
for acute fracture.  The right hip is located.  There are vascular
calcifications.  Limited evaluation of the knee joint.
IMPRESSION: No evidence for acute fracture.

## 2014-02-11 ENCOUNTER — Other Ambulatory Visit: Payer: Self-pay | Admitting: Nurse Practitioner

## 2014-02-13 ENCOUNTER — Telehealth: Payer: Self-pay | Admitting: *Deleted

## 2014-02-13 NOTE — Telephone Encounter (Signed)
Informed daughter of Dr. Calton Dach message below and to keep appt in 2 weeks as scheduled She verbalized understanding.

## 2014-02-13 NOTE — Telephone Encounter (Signed)
Message copied by Cathlean Cower on Wed Feb 13, 2014  2:32 PM ------      Message from: [name] [place]: Tue Feb 12, 2014  4:26 PM      Regarding: labs dated 5/28       pls let daughter know CBC and other tests are OK      Continue with increased dose chemo Targretin ------

## 2014-02-18 ENCOUNTER — Telehealth: Payer: Self-pay | Admitting: Cardiovascular Disease

## 2014-02-18 NOTE — Telephone Encounter (Signed)
RN spoke to [facility].  She states patient had an episode -c/o S.O.B -- B/P 200/76,PULSE 96, RESP 22,. Helene Kelp stated she gave O2  At 2 liter --- O2  Sat 97% ,pulse 89-90 Rechecked b/p 160/80,pulse 64, resp 18. No distress at present. Dr Nyoka Cowden was made aware. Dr Nyoka Cowden wanted to know when next appointment and  Dr Gwenlyn Found to be aware. RN informed Helene Kelp that patient had recall for 01/2014 with extender, and 2 recall for Dr Gwenlyn Found 02/2014 and 3//2016 RN asked if an appointment is needed.Helene Kelp states she will ask patient an call back if needed.  Will send to information to Dr Gwenlyn Found.

## 2014-02-18 NOTE — Telephone Encounter (Signed)
Nurse need to take this information

## 2014-02-20 ENCOUNTER — Non-Acute Institutional Stay (SKILLED_NURSING_FACILITY): Payer: Medicare Other | Admitting: Nurse Practitioner

## 2014-02-20 ENCOUNTER — Encounter: Payer: Self-pay | Admitting: Nurse Practitioner

## 2014-02-20 DIAGNOSIS — R0602 Shortness of breath: Secondary | ICD-10-CM

## 2014-02-20 DIAGNOSIS — I480 Paroxysmal atrial fibrillation: Secondary | ICD-10-CM

## 2014-02-20 DIAGNOSIS — S8012XA Contusion of left lower leg, initial encounter: Secondary | ICD-10-CM | POA: Insufficient documentation

## 2014-02-20 DIAGNOSIS — K219 Gastro-esophageal reflux disease without esophagitis: Secondary | ICD-10-CM

## 2014-02-20 DIAGNOSIS — S8010XA Contusion of unspecified lower leg, initial encounter: Secondary | ICD-10-CM

## 2014-02-20 DIAGNOSIS — G546 Phantom limb syndrome with pain: Secondary | ICD-10-CM

## 2014-02-20 DIAGNOSIS — I4891 Unspecified atrial fibrillation: Secondary | ICD-10-CM

## 2014-02-20 DIAGNOSIS — I10 Essential (primary) hypertension: Secondary | ICD-10-CM

## 2014-02-20 DIAGNOSIS — K5900 Constipation, unspecified: Secondary | ICD-10-CM

## 2014-02-20 DIAGNOSIS — C8404 Mycosis fungoides, lymph nodes of axilla and upper limb: Secondary | ICD-10-CM

## 2014-02-20 DIAGNOSIS — G547 Phantom limb syndrome without pain: Secondary | ICD-10-CM

## 2014-02-20 NOTE — Assessment & Plan Note (Signed)
bradycardic to the 30s and 40s intermittently with occasional pauses up to and just greater than 2 seconds while in hospital. Resolved to 60s after Carvedilol was slightly reduced. Continued anticoagulation with pradaxa and Diltiazem 120mg  daily

## 2014-02-20 NOTE — Assessment & Plan Note (Signed)
Managed with MiraLax qod

## 2014-02-20 NOTE — Assessment & Plan Note (Signed)
Right AKA--managed with Gabapentin 100mg  nightly.

## 2014-02-20 NOTE — Assessment & Plan Note (Signed)
Stable, takes Famotidine 40mg  daily.

## 2014-02-20 NOTE — Assessment & Plan Note (Signed)
Dr. Beryle Beams Oncology routinely. Currently she is taking Targretin 225mg  daily before supper.

## 2014-02-20 NOTE — Assessment & Plan Note (Signed)
Anterior left mid shin-will apply Bactroban oint daily, covered with non adhesive dressing and foam pad until healed.

## 2014-02-20 NOTE — Progress Notes (Signed)
Patient ID: Sara [name], female   DOB: Nov 30, 1918, 80 y.o.   MRN: [id number]   Code Status: DNR  Allergies  Allergen Reactions  . Evista [Raloxifene]   . Penicillins Other (See Comments)    unknown  . Triamterene Other (See Comments)    unknown  . Alendronate Sodium Rash    Pt stated oh k to use biotene    Chief Complaint  Patient presents with  . Medical Management of Chronic Issues  . Acute Visit    SOB and O2 desaturation     HPI: Patient is a 80 y.o. female seen in the SNF at [facility] today for evaluation of SOB, O2 desaturation, and chronic medical conditions.   Hospitalization from 08/27/2013-08/30/2013 for CHF-Furosemide was increased to 60mg  bid and decreased Carvedilol due to bradycardia. Had proBNP 2534, d-dimer 2.97, chest x-ray with central vascular congestion and bilateral pleural effusion.  CBC showed WBC 32.8. BMP was unremarkable.   Problem List Items Addressed This Visit   Unspecified constipation     Managed with MiraLax qod      SOB (shortness of breath) - Primary     O2 desaturation and SOB worse with exertion. Will obtain CXR and BNP to evaluate further. Continue Furosemide 60mg  bid.     Phantom limb pain (Chronic)     Right AKA--managed with Gabapentin 100mg  nightly.       PAF (paroxysmal atrial fibrillation)     bradycardic to the 30s and 40s intermittently with occasional pauses up to and just greater than 2 seconds while in hospital. Resolved to 60s after Carvedilol was slightly reduced. 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 Beryle Beams Oncology routinely. Currently she is taking Targretin 225mg  daily before supper.       HTN (hypertension)     Controlled, takes Coreg 12,5mg  bid, Cardizem DC 120mg  ,  Imdur 30mg ,  and lisinopril 5mg       GERD (gastroesophageal reflux disease)     Stable, takes Famotidine 40mg  daily.       Contusion of left lower leg     Anterior left mid shin-will apply Bactroban oint daily, covered with non adhesive dressing and foam pad until healed.        Review of Systems:  Review of Systems  Constitutional: Negative for fever, chills, weight loss, malaise/fatigue and diaphoresis.  HENT: Positive for hearing loss. Negative for congestion, ear discharge, ear pain and sore throat.   Eyes: Negative for pain, discharge and redness.  Respiratory: Positive for cough. Negative for sputum production, shortness of breath and wheezing.        DOE  Cardiovascular: Positive for leg swelling and PND. Negative for chest pain, palpitations, orthopnea and claudication.       Slightly worse 1+  Gastrointestinal: Negative for heartburn, nausea, vomiting, abdominal pain, diarrhea, constipation and blood in stool.  Genitourinary: Positive for frequency (incontinent of bladder). Negative for dysuria, urgency, hematuria and flank pain.  Musculoskeletal: Positive for joint pain. Negative for back pain, falls, myalgias and neck pain.  Skin: Positive for itching and rash.       Chronic psoriasis, very thin and red skin, frequent skin abrasion or tear. Left mid shin hematoma  Neurological: Negative for dizziness, tingling, tremors, sensory change, speech change, focal weakness, seizures, loss of consciousness, weakness (generalized) and headaches.  Endo/Heme/Allergies: Negative for environmental allergies and polydipsia. Bruises/bleeds  easily.  Psychiatric/Behavioral: Positive for memory loss. Negative for depression and hallucinations. The patient is not nervous/anxious and does not have insomnia.      Past Medical History  Diagnosis Date  . Hypertension   . Atrial fibrillation   . Psoriasis   . Congestive heart failure (CHF)   . History of CVA (cerebrovascular accident)   . Coronary artery disease   . Myocardial infarction   . CHF (congestive heart failure)   . Shortness of breath   . H/O hiatal  hernia   . Closed fracture of head of left humerus 5/12  . Basal cell carcinoma   . 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  Flow cytometry 04/04/12: 91% cells CD4 positive CD 26 negative  . Hyperlipemia   . Pneumonia   . Wound of right leg 09/25/2012    Necrotic, open wound right lower leg; non healing  . Stroke   . Hyposmolality and/or hyponatremia   . Lower limb amputation, above knee 10/11/2012  . Pain in joint, multiple sites   . Mycosis fungoides, unspecified site, extranodal and solid organ sites 09/28/2012  . Occlusion and stenosis of carotid artery without mention of cerebral infarction 09/28/2012  . Chronic venous hypertension with ulcer and inflammation 09/28/2012  . Diverticulosis of colon (without mention of hemorrhage) 09/28/2012  . Synovial cyst of popliteal space 03/26/2012  . Unspecified constipation 03/24/2012  . Hypopotassemia 01/26/2012  . Debility, unspecified 01/17/2012  . Anxiety state, unspecified 12/31/2011  . Anemia, unspecified 12/21/2011  . Other specified disease of white blood cells 12/21/2011  . Peripheral vascular disease, unspecified 12/21/2011    recent ABI Lt of 0.64 down from ).80 on 12/29/11- though different labs  . Reflux esophagitis 12/21/2011  . Unspecified hereditary and idiopathic peripheral neuropathy 12/14/2011  . Pain in joint, ankle and foot 12/14/2011  . Carotid stenosis     CAROTID DOPPLER, 05/02/2012 - LEFT VERTEBRAL-occluded, LEFT BULB AND PROXIMAL ICA STENT-moderate amount of irregular mixed dense plaque 50-69% diameter reduction  . Pre-syncope     NUCLEAR STRESS TEST, 12/10/2009 - normal, EKG negative for ischemia  . TIA (transient ischemic attack)     2D ECHO, 12/05/2012 - EF 60-65%, Severely calcified annulus of the mitral valve with mild-moderate regurgitation, LA moderate-severely dilated  .  Paroxysmal atrial fibrillation   . CAD in native artery     3 vessel CAD, medical therapy, not a candidate for CABG  . S/P AKA (above knee amputation) unilateral, Rt leg due to PAD and non healing wound 06/27/2013  . HCAP (healthcare-associated pneumonia) 01/08/2012   Past Surgical History  Procedure Laterality Date  . Tonsillectomy    . Dilation and curettage of uterus    . Eye surgery    . Cataracts    . Vertebroplasty    . Amputation  10/10/2012    Procedure: AMPUTATION ABOVE KNEE;  Surgeon: Newt Minion, MD;  Location: [facility];  Service: Orthopedics;  Laterality: Right;  . Percutaneous stent intervention  04/13/2011    Left internal carotid artery stented with a 8x30 prcise nitinol self-expanding stent resulting reduction of 90% to less than 20% residual  . Cardiac catheterization  11/06/2010    CABG recommended, turned down by [facility] secondary to co morbidities   Social History:   reports that she has never smoked. She has never used smokeless tobacco. She  reports that she does not drink alcohol or use illicit drugs.  Family History  Problem Relation Age of Onset  . Stomach cancer Mother   . Pneumonia Father   . Cirrhosis Brother   . Diabetes Brother   . Heart attack Paternal Grandmother   . Heart failure Sister   . Diabetes Sister   . Heart failure Sister     Medications: Patient's Medications  New Prescriptions   No medications on file  Previous Medications   ATORVASTATIN (LIPITOR) 40 MG TABLET    Take 1 tablet (40 mg total) by mouth daily at 6 PM.   BEXAROTENE (TARGRETIN) 75 MG CAPS CAPSULE    Take 3 capsules (225 mg total) by mouth daily before supper. Give with food. Protect from light. CAUTION: Chemotherapy/Biotherapy-Wear gloves when handling   CARVEDILOL (COREG) 12.5 MG TABLET    Take 1 tablet (12.5 mg total) by mouth 2 (two) times daily with a meal.   DABIGATRAN (PRADAXA) 150 MG CAPS    Take 75 mg by mouth every 12 (twelve) hours.    DILTIAZEM (CARDIZEM CD) 120 MG  24 HR CAPSULE    Take 120 mg by mouth daily. Take 1 tablet once daily for BP.   FAMOTIDINE (PEPCID) 20 MG TABLET    Take 40 mg by mouth daily.   FOLIC ACID (FOLVITE) 1 MG TABLET    Take 1 mg by mouth daily.   FUROSEMIDE (LASIX) 40 MG TABLET    Take 1.5 tablets (60 mg total) by mouth 2 (two) times daily.   GABAPENTIN (NEURONTIN) 100 MG CAPSULE    Take 1 capsule (100 mg total) by mouth at bedtime.   GUAIFENESIN (MUCINEX) 600 MG 12 HR TABLET    Take by mouth 2 (two) times daily.   IPRATROPIUM-ALBUTEROL (DUONEB) 0.5-2.5 (3) MG/3ML SOLN    Take 3 mLs by nebulization every 6 (six) hours as needed.    ISOSORBIDE MONONITRATE (IMDUR) 30 MG 24 HR TABLET    Take 1 tablet (30 mg total) by mouth daily.   LEVOCETIRIZINE (XYZAL) 5 MG TABLET    Take 5 mg by mouth as needed for allergies.   LISINOPRIL (PRINIVIL,ZESTRIL) 10 MG TABLET    Take 10 mg by mouth daily.   NITROGLYCERIN (NITROSTAT) 0.4 MG SL TABLET    Place 0.4 mg under the tongue every 5 (five) minutes as needed for chest pain. If no relief call MD.   OMEGA-3 FATTY ACIDS (FISH OIL) 1000 MG CAPS    Take 1,000 mg by mouth at bedtime.    POLYETHYLENE GLYCOL (MIRALAX / GLYCOLAX) PACKET    Take 17 g by mouth every other day. Take 1 packet mixed in 4 oz of beverage every day to relieve constipation.   POLYVINYL ALCOHOL-POVIDONE (REFRESH OP)    Apply 1 drop to eye 4 (four) times daily. Instill 1 drop to both eyes four times daily.*Wait 3-5 minutes between eye drops*   TRIAMCINOLONE CREAM (KENALOG) 0.1 %    Apply topically as directed.   VITAMIN C (ASCORBIC ACID) 500 MG TABLET    Take 500 mg by mouth daily.  Modified Medications   No medications on file  Discontinued Medications   No medications on file     Physical Exam: Physical Exam  Constitutional: She is oriented to person, place, and time. She appears well-developed and well-nourished. No distress.  HENT:  Head: Normocephalic.  Right Ear: External ear normal.  Left Ear: External ear normal.    Nose: Nose normal.  Mouth/Throat: Oropharynx is clear and moist. No oropharyngeal exudate.  Eyes: EOM are normal. Pupils are equal, round, and reactive to light. Right conjunctiva is injected.  Only a few eyelashes remained and right conjunctiva mild irritated with bilateral lower eyelid eversion.   Neck: Normal range of motion. Neck supple. JVD present. No thyromegaly present.  Cardiovascular: Normal rate, regular rhythm and normal heart sounds.  Exam reveals decreased pulses.   No murmur heard. Pulses:      Dorsalis pedis pulses are 1+ on the left side.       Posterior tibial pulses are 1+ on the left side.  Pulmonary/Chest: Effort normal and breath sounds normal. No respiratory distress. She has no wheezes. She exhibits no tenderness.  Decreased breath sounds posterior mid lungs.   Abdominal: Soft. Bowel sounds are normal. She exhibits no distension. There is no tenderness.  Genitourinary:  Incontinent of bladder  Musculoskeletal: Normal range of motion. She exhibits edema. She exhibits no tenderness.  R AKA. Left lower leg edema appears worse 1+  Lymphadenopathy:    She has no cervical adenopathy.  Neurological: She is alert and oriented to person, place, and time. She has normal reflexes. No cranial nerve deficit. She exhibits normal muscle tone. Coordination normal.  Skin: Rash noted. There is erythema.  Diffused erythematous scaly skin appearance. Psoriasis. Anterior left shin hematoma.   Psychiatric: She has a normal mood and affect. Her behavior is normal. Judgment and thought content normal. She exhibits abnormal recent memory.    Filed Vitals:   02/20/14 1440  BP: 134/72  Pulse: 74  Temp: 97.3 F (36.3 C)  TempSrc: Tympanic  Resp: 22      Labs reviewed: Basic Metabolic Panel:  Recent Labs  08/02/13  08/27/13 1835 08/28/13 0445 08/29/13 0456 08/30/13 0453 09/11/13 1527 09/11/13 1527 10/23/13 1406 12/22/13 12/28/13 1417 12/28/13 1417 02/07/14  NA 140  < >   --  137 139 134*  --  142 142 140  --  139 138  K 4.3  < >  --  3.7 3.7 4.0  --  3.8 4.0 3.7  --  3.6 3.7  CL  --   < >  --  97 98 94*  --   --   --   --   --   --   --   CO2  --   < >  --  31 29 29   --  25 33*  --   --  28  --   GLUCOSE  --   < >  --  97 97 103*  --  119 102  --   --  130  --   BUN 13  < >  --  17 23 30*  --  12.3 13.6 12  --  15.1 19  CREATININE 0.6  < >  --  0.72 0.79 0.85  --  0.7 0.8 0.6  --  0.8 0.6  CALCIUM  --   < >  --  9.5 9.5 9.4  --  9.4 9.8  --   --  9.6  --   MG  --   --  2.0  --   --   --   --   --   --   --   --   --   --   TSH 0.99  --   --   --   --   --  1.009  --   --   --  1.393  --   --   < > = values in this interval not displayed. Liver Function Tests:  Recent Labs  09/11/13 1527 10/23/13 1406 12/28/13 1417 02/07/14  AST 18 20 18 17   ALT 14 11 8 10   ALKPHOS 72 74 67 52  BILITOT 0.24 0.31 0.29  --   PROT 7.3 7.1 6.9  --   ALBUMIN 3.6 3.7 3.4*  --     Recent Labs  07/24/13 1415 09/11/13 1529  LIPASE 20 29  AMYLASE 20 22   CBC:  Recent Labs  09/11/13 1527 10/23/13 1406 12/28/13 1417 02/07/14  WBC 33.0* 28.2* 27.1* 32.5  NEUTROABS 7.5* 5.8 5.3  --   HGB 13.2 13.6 12.3 13.0  HCT 40.7 41.7 38.3 38  MCV 94.4 93.0 94.1  --   PLT 298 239 263 260   Lipid Panel:  Recent Labs  08/02/13 09/11/13 1529 12/28/13 1419  CHOL  --   --  220*  HDL  --   --  30*  LDLCALC  --   --  134*  TRIG 188* 303* 281*  CHOLHDL  --   --  7.3    Past Procedures:  Dg Chest Port 1 View  08/30/2013 CLINICAL DATA: History of CHF with worsening. EXAM: PORTABLE CHEST - 1 VIEW  IMPRESSION: The findings are consistent with the CHF with evidence of some mild improvement in the interstitial edema since the earlier study.   Dg Chest Portable 1 View  08/27/2013 CLINICAL DATA: Cough, congestion EXAM: PORTABLE CHEST - 1 VIEW IMPRESSION: Central vascular congestion without convincing pulmonary edema. Bilateral small pleural effusion with bilateral basilar  atelectasis or infiltrate.  12/20/13 CXR no cardiomegaly or pulmonary vascular congestion, small bilateral pleural effusions unchanged, patchy bibasilar atelectasis or pneumonia worse on the right and unchanged on the left, old granulomatous disease re demonstrated.   Assessment/Plan SOB (shortness of breath) O2 desaturation and SOB worse with exertion. Will obtain CXR and BNP to evaluate further. Continue Furosemide 60mg  bid.   Unspecified constipation Managed with MiraLax qod    PAF (paroxysmal atrial fibrillation) bradycardic to the 30s and 40s intermittently with occasional pauses up to and just greater than 2 seconds while in hospital. Resolved to 60s after Carvedilol was slightly reduced. Continued anticoagulation with pradaxa and Diltiazem 120mg  daily    HTN (hypertension) Controlled, takes Coreg 12,5mg  bid, Cardizem DC 120mg  ,  Imdur 30mg ,  and lisinopril 5mg     GERD (gastroesophageal reflux disease) Stable, takes Famotidine 40mg  daily.     Phantom limb pain Right AKA--managed with Gabapentin 100mg  nightly.     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 Beryle Beams Oncology routinely. Currently she is taking Targretin 225mg  daily before supper.     Contusion of left lower leg Anterior left mid shin-will apply Bactroban oint daily, covered with non adhesive dressing and foam pad until healed.     Family/ Staff Communication: observe the patient  Goals of Care: SNF  Labs/tests ordered: CXR. BNP

## 2014-02-20 NOTE — Assessment & Plan Note (Signed)
Controlled, takes Coreg 12,5mg  bid, Cardizem DC 120mg  ,  Imdur 30mg ,  and lisinopril 5mg 

## 2014-02-20 NOTE — Assessment & Plan Note (Signed)
O2 desaturation and SOB worse with exertion. Will obtain CXR and BNP to evaluate further. Continue Furosemide 60mg  bid.

## 2014-02-21 ENCOUNTER — Ambulatory Visit (INDEPENDENT_AMBULATORY_CARE_PROVIDER_SITE_OTHER): Payer: Medicare Other | Admitting: Cardiovascular Disease

## 2014-02-21 ENCOUNTER — Encounter: Payer: Self-pay | Admitting: Cardiovascular Disease

## 2014-02-21 VITALS — BP 168/80 | HR 73 | Ht 66.0 in | Wt 142.0 lb

## 2014-02-21 DIAGNOSIS — E785 Hyperlipidemia, unspecified: Secondary | ICD-10-CM

## 2014-02-21 DIAGNOSIS — I739 Peripheral vascular disease, unspecified: Secondary | ICD-10-CM

## 2014-02-21 DIAGNOSIS — I10 Essential (primary) hypertension: Secondary | ICD-10-CM

## 2014-02-21 DIAGNOSIS — I4891 Unspecified atrial fibrillation: Secondary | ICD-10-CM

## 2014-02-21 DIAGNOSIS — I5032 Chronic diastolic (congestive) heart failure: Secondary | ICD-10-CM

## 2014-02-21 DIAGNOSIS — I2510 Atherosclerotic heart disease of native coronary artery without angina pectoris: Secondary | ICD-10-CM

## 2014-02-21 DIAGNOSIS — I480 Paroxysmal atrial fibrillation: Secondary | ICD-10-CM

## 2014-02-21 NOTE — Assessment & Plan Note (Signed)
Well-controlled on current medications 

## 2014-02-21 NOTE — Progress Notes (Signed)
02/21/2014 Sara [name]   19-Nov-1918  [id number]  Primary Physician GREEN, Viviann Spare, MD Primary Cardiologist: Lorretta Harp MD Renae Gloss   HPI:  This 80 year old patient normally sees Dr. Gwenlyn Found. She is here for post hospitalization after CVA. She was hospitalized first in January 2014 and had an above-the-knee amputation then she was readmitted for syncope felt to be symptomatic hypotension. The MRI of the brain was negative for acute infarct. She had an echo and carotid Dopplers that were stable and no other issues. She was given IV fluids. At that time, she was on aspirin and Plavix. Her carvedilol was down to 12.5 mg twice a day and her lisinopril was at 5 mg a day, previously had been at 40. She was readmitted in February due to dyspnea on exertion and nonhealing wound. She improved from that hospitalization and I do not see that we were consulted on any of these hospitalizations. On December 03, 2012, she was readmitted with acute stroke. She was having speech difficulties. She knew what she wanted to say but she could not get the words out. She was found to be in A-fib with RVR and medications were adjusted including increasing her carvedilol back to 25 mg twice a day. She was discharged on December 05, 2012, and is in the skilled nursing facility at [facility] for rehab from her AKA as well as her CVA, though, she has not much residual from the CVA. At the time of discharge, on December 03, 2012, she was in sinus rhythm. During that hospitalization she had A-fib with RVR but on arrival to [facility] she was back in sinus rhythm as well. Since that time, she stabilized. Previously, we only had her on aspirin and Plavix, but due to the new right-sided CVA, most likely embolic from her A-fib, we added Pradaxa and stopped her aspirin completely. Additionally, in the past she has been diagnosed with psoriasis but she has been followed with Dr. 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 Other history does include coronary artery disease, significant 3-vessel disease and is not felt to be a good bypass candidate. She was seen by Dr. Cyndia Bent in the past and medical therapy only has been recommended. She has also had a high-grade left internal carotid artery stenosis that Dr. Gwenlyn Found stented July 2012 under the [facility] protocol. Dopplers during this year with hospitalization reveal that the stent was widely patent.   The patient overall has done fairly well. She feels much better now and she is being treated for her skin lymphoma. Since she was last seen in the office 01/15/13 [name] RN P. She feels clinically well. She specifically denies chest pain or shortness of breath. She is wheelchair-bound because of her right above-the-knee amputation was performed 10/10/12 and has yet to be fitted with a prosthesis. She sought [name] in the office in December. She had recently been admitted with congestive heart failure thought to be related to diastolic dysfunction with PAF. Her medicines were adjusted and she was diuresed. She was at a friend's home and is wheelchair-bound.I saw her in the office last 12/11/13. She's been fairly well since that time but she has required oxygen and does complain of shortness of breath but denies chest pain. She is wheelchair bound and is not ambulatory. She has had congestive heart failure in the past thought to be related to  chronic diastolic dysfunction.   Current Outpatient Prescriptions  Medication Sig Dispense Refill  . atorvastatin (LIPITOR) 40 MG tablet Take 1 tablet (40 mg total) by mouth daily at 6 PM.      . bexarotene (TARGRETIN) 75 MG CAPS capsule Take 3 capsules (225 mg total) by mouth daily before supper. Give with food. Protect from light. CAUTION: Chemotherapy/Biotherapy-Wear gloves when handling  90 capsule  0  . carvedilol (COREG) 12.5 MG tablet Take 1 tablet (12.5 mg  total) by mouth 2 (two) times daily with a meal.  60 tablet  0  . dabigatran (PRADAXA) 150 MG CAPS Take 75 mg by mouth every 12 (twelve) hours.       Marland Kitchen diltiazem (CARDIZEM CD) 120 MG 24 hr capsule Take 120 mg by mouth daily. Take 1 tablet once daily for BP.      . famotidine (PEPCID) 20 MG tablet Take 40 mg by mouth daily.      . folic acid (FOLVITE) 1 MG tablet Take 1 mg by mouth daily.      . furosemide (LASIX) 40 MG tablet Take 1.5 tablets (60 mg total) by mouth 2 (two) times daily.  30 tablet  0  . gabapentin (NEURONTIN) 100 MG capsule Take 1 capsule (100 mg total) by mouth at bedtime.      Marland Kitchen guaiFENesin (MUCINEX) 600 MG 12 hr tablet Take by mouth 2 (two) times daily.      Marland Kitchen ipratropium-albuterol (DUONEB) 0.5-2.5 (3) MG/3ML SOLN Take 3 mLs by nebulization every 6 (six) hours as needed.       . isosorbide mononitrate (IMDUR) 30 MG 24 hr tablet Take 1 tablet (30 mg total) by mouth daily.  30 tablet  6  . levocetirizine (XYZAL) 5 MG tablet Take 5 mg by mouth as needed for allergies.      Marland Kitchen lisinopril (PRINIVIL,ZESTRIL) 5 MG tablet Take 5 mg by mouth daily.      . nitroGLYCERIN (NITROSTAT) 0.4 MG SL tablet Place 0.4 mg under the tongue every 5 (five) minutes as needed for chest pain. If no relief call MD.      . Omega-3 Fatty Acids (FISH OIL) 1000 MG CAPS Take 1,000 mg by mouth at bedtime.       . polyethylene glycol (MIRALAX / GLYCOLAX) packet Take 17 g by mouth every other day. Take 1 packet mixed in 4 oz of beverage every day to relieve constipation.      . Polyvinyl Alcohol-Povidone (REFRESH OP) Apply 1 drop to eye 4 (four) times daily. Instill 1 drop to both eyes four times daily.*Wait 3-5 minutes between eye drops*      . triamcinolone cream (KENALOG) 0.1 % Apply topically as directed.      . vitamin C (ASCORBIC ACID) 500 MG tablet Take 500 mg by mouth daily.       No current facility-administered medications for this visit.    Allergies  Allergen Reactions  . Evista [Raloxifene]   .  Penicillins Other (See Comments)    unknown  . Triamterene Other (See Comments)    unknown  . Alendronate Sodium Rash    Pt stated oh k to use biotene    History   Social History  . Marital Status: Widowed    Spouse Name: N/A    Number of Children: 2  . Years of Education: N/A   Occupational History  . Not on file.   Social History Main Topics  . Smoking status: Never Smoker   .  Smokeless tobacco: Never Used  . Alcohol Use: No  . Drug Use: No  . Sexual Activity: No   Other Topics Concern  . Not on file   Social History Narrative  . No narrative on file     Review of Systems: General: negative for chills, fever, night sweats or weight changes.  Cardiovascular: negative for chest pain, dyspnea on exertion, edema, orthopnea, palpitations, paroxysmal nocturnal dyspnea or shortness of breath Dermatological: negative for rash Respiratory: negative for cough or wheezing Urologic: negative for hematuria Abdominal: negative for nausea, vomiting, diarrhea, bright red blood per rectum, melena, or hematemesis Neurologic: negative for visual changes, syncope, or dizziness All other systems reviewed and are otherwise negative except as noted above.    Blood pressure 168/80, pulse 73, height 5\' 6"  (1.676 m), weight 142 lb (64.411 kg).  General appearance: alert Neck: no adenopathy, no JVD, supple, symmetrical, trachea midline, thyroid not enlarged, symmetric, no tenderness/mass/nodules and bilateral carotid bruits Lungs: clear to auscultation bilaterally Heart: regular rate and rhythm, S1, S2 normal, no murmur, click, rub or gallop Extremities: right AKA, trace left lower extremity pretibial edema  EKG normal sinus rhythm at 62 with nonspecific ST and T wave changes.  ASSESSMENT AND PLAN:   PAD (peripheral artery disease)- LICA stent 0/98 Status post left internal carotid artery stenting by myself for the [facility] 04/13/11. Recent followup carotid Dopplers revealed  these to be widely patent.  Hyperlipidemia On statin therapy. Recent lipid profile performed/17/15 revealed a total cholesterol 220, LDL 134 HDL of 30  CAD- 3V March 2012- medical Rx History of 2 vessel CAD by cath in 2012. She has normal LV function and mild aortic stenosis. Medical therapy was recommended. She denies chest pain does have chronic shortness of breath.  HTN (hypertension) Well-controlled on current medications  PAF (paroxysmal atrial fibrillation) History of paroxysmal atrial fibrillation. She is on Pradaxa . Today she is in sinus rhythm.      Lorretta Harp MD FACP,FACC,FAHA, [facility] 02/21/2014 3:56 PM

## 2014-02-21 NOTE — Assessment & Plan Note (Signed)
Status post left internal carotid artery stenting by myself for the [facility] 04/13/11. Recent followup carotid Dopplers revealed these to be widely patent.

## 2014-02-21 NOTE — Assessment & Plan Note (Signed)
On statin therapy. Recent lipid profile performed/17/15 revealed a total cholesterol 220, LDL 134 HDL of 30

## 2014-02-21 NOTE — Patient Instructions (Signed)
We request that you follow-up in: 3 months with an extender and in 6 months with Dr Berry  You will receive a reminder letter in the mail two months in advance. If you don't receive a letter, please call our office to schedule the follow-up appointment.   

## 2014-02-21 NOTE — Assessment & Plan Note (Signed)
History of 2 vessel CAD by cath in 2012. She has normal LV function and mild aortic stenosis. Medical therapy was recommended. She denies chest pain does have chronic shortness of breath.

## 2014-02-21 NOTE — Assessment & Plan Note (Signed)
History of paroxysmal atrial fibrillation. She is on Pradaxa . Today she is in sinus rhythm.

## 2014-02-25 ENCOUNTER — Encounter: Payer: Self-pay | Admitting: Nurse Practitioner

## 2014-02-26 ENCOUNTER — Other Ambulatory Visit: Payer: Self-pay | Admitting: *Deleted

## 2014-02-26 ENCOUNTER — Other Ambulatory Visit: Payer: Self-pay

## 2014-02-26 DIAGNOSIS — C8404 Mycosis fungoides, lymph nodes of axilla and upper limb: Secondary | ICD-10-CM

## 2014-02-26 MED ORDER — BEXAROTENE 75 MG PO CAPS: 225.0000 mg | ORAL_CAPSULE | Freq: Every day | ORAL | Status: DC

## 2014-02-26 NOTE — Telephone Encounter (Signed)
THIS REFILL REQUEST FOR TARGRETIN WAS PLACED ON DR.GORSUCH'S DESK.

## 2014-02-27 ENCOUNTER — Telehealth: Payer: Self-pay | Admitting: Hematology and Oncology

## 2014-02-27 ENCOUNTER — Other Ambulatory Visit: Payer: Self-pay | Admitting: *Deleted

## 2014-02-27 ENCOUNTER — Ambulatory Visit (HOSPITAL_BASED_OUTPATIENT_CLINIC_OR_DEPARTMENT_OTHER): Payer: Medicare Other | Admitting: Hematology and Oncology

## 2014-02-27 ENCOUNTER — Other Ambulatory Visit (HOSPITAL_BASED_OUTPATIENT_CLINIC_OR_DEPARTMENT_OTHER): Payer: Medicare Other

## 2014-02-27 ENCOUNTER — Encounter: Payer: Self-pay | Admitting: Hematology and Oncology

## 2014-02-27 VITALS — BP 156/62 | HR 62 | Temp 98.2°F | Resp 18

## 2014-02-27 DIAGNOSIS — C8404 Mycosis fungoides, lymph nodes of axilla and upper limb: Secondary | ICD-10-CM

## 2014-02-27 DIAGNOSIS — L538 Other specified erythematous conditions: Secondary | ICD-10-CM

## 2014-02-27 DIAGNOSIS — C84A0 Cutaneous T-cell lymphoma, unspecified, unspecified site: Secondary | ICD-10-CM

## 2014-02-27 LAB — CBC WITH DIFFERENTIAL/PLATELET
BASO%: 0.4 % (ref 0.0–2.0)
Basophils Absolute: 0.1 10*3/uL (ref 0.0–0.1)
EOS%: 0.9 % (ref 0.0–7.0)
Eosinophils Absolute: 0.3 10*3/uL (ref 0.0–0.5)
HEMATOCRIT: 39 % (ref 34.8–46.6)
HEMOGLOBIN: 12.8 g/dL (ref 11.6–15.9)
LYMPH%: 75 % — ABNORMAL HIGH (ref 14.0–49.7)
MCH: 30.2 pg (ref 25.1–34.0)
MCHC: 32.8 g/dL (ref 31.5–36.0)
MCV: 92 fL (ref 79.5–101.0)
MONO#: 0.7 10*3/uL (ref 0.1–0.9)
MONO%: 2.8 % (ref 0.0–14.0)
NEUT#: 5.5 10*3/uL (ref 1.5–6.5)
NEUT%: 20.9 % — AB (ref 38.4–76.8)
PLATELETS: 276 10*3/uL (ref 145–400)
RBC: 4.24 10*6/uL (ref 3.70–5.45)
RDW: 13.2 % (ref 11.2–14.5)
WBC: 26.5 10*3/uL — AB (ref 3.9–10.3)
lymph#: 19.9 10*3/uL — ABNORMAL HIGH (ref 0.9–3.3)

## 2014-02-27 LAB — MORPHOLOGY: PLT EST: ADEQUATE

## 2014-02-27 LAB — COMPREHENSIVE METABOLIC PANEL (CC13)
ALK PHOS: 72 U/L (ref 40–150)
ALT: 9 U/L (ref 0–55)
ANION GAP: 12 meq/L — AB (ref 3–11)
AST: 16 U/L (ref 5–34)
Albumin: 3.5 g/dL (ref 3.5–5.0)
BILIRUBIN TOTAL: 0.32 mg/dL (ref 0.20–1.20)
BUN: 15.9 mg/dL (ref 7.0–26.0)
CO2: 31 meq/L — AB (ref 22–29)
CREATININE: 0.9 mg/dL (ref 0.6–1.1)
Calcium: 9.7 mg/dL (ref 8.4–10.4)
Chloride: 98 mEq/L (ref 98–109)
Glucose: 161 mg/dl — ABNORMAL HIGH (ref 70–140)
Potassium: 3.9 mEq/L (ref 3.5–5.1)
SODIUM: 140 meq/L (ref 136–145)
TOTAL PROTEIN: 7.2 g/dL (ref 6.4–8.3)

## 2014-02-27 LAB — CBC AND DIFFERENTIAL
HEMATOCRIT: 28 % — AB (ref 36–46)
HEMOGLOBIN: 12.8 g/dL (ref 12.0–16.0)
PLATELETS: 276 10*3/uL (ref 150–399)
WBC: 26.5 10^3/mL

## 2014-02-27 LAB — BASIC METABOLIC PANEL
BUN: 16 mg/dL (ref 4–21)
Creatinine: 0.9 mg/dL (ref 0.5–1.1)
Glucose: 161 mg/dL
Potassium: 3.9 mmol/L (ref 3.4–5.3)
SODIUM: 140 mmol/L (ref 137–147)

## 2014-02-27 LAB — LACTATE DEHYDROGENASE (CC13): LDH: 123 U/L — ABNORMAL LOW (ref 125–245)

## 2014-02-27 LAB — HEPATIC FUNCTION PANEL
ALK PHOS: 72 U/L (ref 25–125)
ALT: 9 U/L (ref 7–35)
AST: 16 U/L (ref 13–35)
Bilirubin, Total: 0.3 mg/dL

## 2014-02-27 NOTE — Assessment & Plan Note (Signed)
She appears to be doing better with increased dose of Targretin recently. Her blood counts are stable. Clinically, skin rash and desquamation is slightly better. I recommend continue the same dose for another month. If I do not see any major improvement of her blood work, I recommend to increase it to 4 tablets a day. The patient will continue blood counts at the nursing home every other week.

## 2014-02-27 NOTE — Telephone Encounter (Signed)
Rx for Targretin faxed to [facility] at fax [phone].

## 2014-02-27 NOTE — Progress Notes (Signed)
[facility] OFFICE PROGRESS NOTE  Patient Care Team: Estill Dooms, MD as PCP - General (Internal Medicine) Devra Dopp, MD as Referring Physician (Dermatology) Renaldo Harrison, MD as Consulting Physician (Dermatology) Rexene Edison, MD as Consulting Physician (Radiation Oncology)  SUMMARY OF ONCOLOGIC HISTORY: Her original diagnosis was in July 2013. Some initial difficulty in establishing the diagnosis in view of a history of chronic psoriasis. She was referred to Dr. 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 She recommended a trial of PUVA plus interferon. The patient's medical condition was unstable at that time and after discussing the potential side effects of this regimen she elected not to go on the interferon-based treatment. A trial of Targretin was started in January 2014. She had a number of major medical problems over the last year, most dramatic of which was necessity for a right above the knee amputation for osteomyelitis.  She had a nonhealing, chronic wound, on her right ankle and was closely followed by orthopedic surgery. She had a syncopal episode and was hospitalized on 10/05/2012. Etiology of the syncope was not clear and symptoms resolved. However while hospitalized, she was reevaluated for her ankle wound by her orthopedist. She eventually had to have a right above the knee amputation for findings of osteomyelitis. She had another admission for decompensated congestive heart failure in February. She likely suffered a stroke at that time. This was felt to be secondary to known atrial fibrillation. She was already on aspirin. Plavix initially added but neurologist recommended stopping the Plavix and putting her on Pradaxa. Full dose Pradaxa 150 mg twice daily started around 12/05/2012. This essentially day derailed any consideration for PUVA treatments from a practical point of view. She had to  move to a skilled care facility. The Targretin was initially started a dose of 75 mg daily. Dose was escalated at the end of March, 2014, to 150 mg. She has tolerated the drug well. She has  adenopathy  limited to her axillae. By 03/20/2013 visit I felt that she was showing a moderate response to the drug. There was a significant decrease in erythema and desquamation. At time of her visit on August 26, multiple new papules noted on the skin of her trunk.  Due to her age and now handicapped status, she did not want to go to [facility] for an evaluation for electron beam radiation therapy. She was therefore evaluated here in [facility] by Dr. Arloa Koh. He didn't think that the chest papules were significant enough to initiate a course of radiation. At time of her visit here on December 30, she had just been discharged from the hospital with an episode of heart failure. In April 2015, the patient was admitted to the hospital for pneumonia. At the end of May 2015, the dose of chemotherapy was increased to 225 mg daily.  INTERVAL HISTORY: Please see below for problem oriented charting. In the clinic, she see signs of improvement and desquamation of her skin. She denies any new lymphadenopathy. She is improving from pneumonia. She has an open sore in the left leg due to recent accidental injury.  REVIEW OF SYSTEMS:   Constitutional: Denies fevers, chills or abnormal weight loss Eyes: Denies blurriness of vision Ears, nose, mouth, throat, and face: Denies mucositis or sore throat Respiratory: Denies cough, dyspnea or wheezes Cardiovascular: Denies palpitation, chest discomfort or lower extremity swelling Gastrointestinal:  Denies nausea, heartburn or change in bowel habits Lymphatics:  Denies new lymphadenopathy or easy bruising Neurological:Denies numbness, tingling or new weaknesses Behavioral/Psych: Mood is stable, no new changes  All other systems were reviewed with the patient and are  negative.  I have reviewed the past medical history, past surgical history, social history and family history with the patient and they are unchanged from previous note.  ALLERGIES:  is allergic to evista; penicillins; triamterene; and alendronate sodium.  MEDICATIONS:  Current Outpatient Prescriptions  Medication Sig Dispense Refill  . atorvastatin (LIPITOR) 40 MG tablet Take 1 tablet (40 mg total) by mouth daily at 6 PM.      . bexarotene (TARGRETIN) 75 MG CAPS capsule Take 3 capsules (225 mg total) by mouth daily before supper. Give with food. Protect from light. CAUTION: Chemotherapy/Biotherapy-Wear gloves when handling  270 capsule  0  . carvedilol (COREG) 12.5 MG tablet Take 1 tablet (12.5 mg total) by mouth 2 (two) times daily with a meal.  60 tablet  0  . dabigatran (PRADAXA) 150 MG CAPS Take 75 mg by mouth every 12 (twelve) hours.       Marland Kitchen diltiazem (CARDIZEM CD) 120 MG 24 hr capsule Take 120 mg by mouth daily. Take 1 tablet once daily for BP.      . famotidine (PEPCID) 20 MG tablet Take 40 mg by mouth daily.      . folic acid (FOLVITE) 1 MG tablet Take 1 mg by mouth daily.      . furosemide (LASIX) 40 MG tablet Take 1.5 tablets (60 mg total) by mouth 2 (two) times daily.  30 tablet  0  . gabapentin (NEURONTIN) 100 MG capsule Take 1 capsule (100 mg total) by mouth at bedtime.      Marland Kitchen guaiFENesin (MUCINEX) 600 MG 12 hr tablet Take by mouth 2 (two) times daily.      Marland Kitchen ipratropium-albuterol (DUONEB) 0.5-2.5 (3) MG/3ML SOLN Take 3 mLs by nebulization every 6 (six) hours as needed.       . isosorbide mononitrate (IMDUR) 30 MG 24 hr tablet Take 1 tablet (30 mg total) by mouth daily.  30 tablet  6  . levocetirizine (XYZAL) 5 MG tablet Take 5 mg by mouth as needed for allergies.      Marland Kitchen lisinopril (PRINIVIL,ZESTRIL) 5 MG tablet Take 5 mg by mouth daily.      . nitroGLYCERIN (NITROSTAT) 0.4 MG SL tablet Place 0.4 mg under the tongue every 5 (five) minutes as needed for chest pain. If no relief  call MD.      . Omega-3 Fatty Acids (FISH OIL) 1000 MG CAPS Take 1,000 mg by mouth at bedtime.       . polyethylene glycol (MIRALAX / GLYCOLAX) packet Take 17 g by mouth every other day. Take 1 packet mixed in 4 oz of beverage every day to relieve constipation.      . Polyvinyl Alcohol-Povidone (REFRESH OP) Apply 1 drop to eye 4 (four) times daily. Instill 1 drop to both eyes four times daily.*Wait 3-5 minutes between eye drops*      . triamcinolone cream (KENALOG) 0.1 % Apply topically as directed.      . vitamin C (ASCORBIC ACID) 500 MG tablet Take 500 mg by mouth daily.       No current facility-administered medications for this visit.    PHYSICAL EXAMINATION: ECOG PERFORMANCE STATUS: 3 - Symptomatic, >50% confined to bed  Filed Vitals:   02/27/14 1108  BP: 156/62  Pulse: 62  Temp: 98.2 F (36.8 C)  Resp:  George    GENERAL:alert, no distress and comfortable SKIN: Significant erythema throughout her skin with occasional dry skin and desquamation on her palms, improved compared to previous visit EYES: normal, Conjunctiva are pink and injected, sclera clear OROPHARYNX:no exudate, no erythema and lips, buccal mucosa, and tongue normal  NECK: supple, thyroid normal size, non-tender, without nodularity LYMPH:  no palpable lymphadenopathy in the cervical, axillary or inguinal LUNGS: clear to auscultation and percussion with normal breathing effort HEART: regular rate & rhythm and no murmurs and no lower extremity edema ABDOMEN:abdomen soft, non-tender and normal bowel sounds Musculoskeletal:no cyanosis of digits and no clubbing  NEURO: alert & oriented x 3 with fluent speech, no focal motor/sensory deficits  LABORATORY DATA:  I have reviewed the data as listed    Component Value Date/Time   NA 140 02/27/2014 1049   NA 138 02/07/2014   NA 134* 08/30/2013 0453   K 3.9 02/27/2014 1049   K 3.7 02/07/2014   CL 94* 08/30/2013 0453   CL 102 09/25/2012 1002   CO2 31*  02/27/2014 1049   CO2 29 08/30/2013 0453   GLUCOSE 161* 02/27/2014 1049   GLUCOSE 103* 08/30/2013 0453   GLUCOSE 145* 09/25/2012 1002   BUN 15.9 02/27/2014 1049   BUN 19 02/07/2014   BUN 30* 08/30/2013 0453   CREATININE 0.9 02/27/2014 1049   CREATININE 0.6 02/07/2014   CREATININE 0.85 08/30/2013 0453   CREATININE 0.56 01/23/2013 0720   CALCIUM 9.7 02/27/2014 1049   CALCIUM 9.4 08/30/2013 0453   PROT 7.2 02/27/2014 1049   PROT 7.0 08/27/2013 1150   ALBUMIN 3.5 02/27/2014 1049   ALBUMIN 3.6 08/27/2013 1150   AST 16 02/27/2014 1049   AST 17 02/07/2014   ALT 9 02/27/2014 1049   ALT 10 02/07/2014   ALKPHOS 72 02/27/2014 1049   ALKPHOS 52 02/07/2014   BILITOT 0.32 02/27/2014 1049   BILITOT 0.3 08/27/2013 1150   GFRNONAA 58* 08/30/2013 0453   GFRAA 67* 08/30/2013 0453    No results found for this basename: SPEP,  UPEP,   kappa and lambda light chains    Lab Results  Component Value Date   WBC 26.5* 02/27/2014   NEUTROABS 5.5 02/27/2014   HGB 12.8 02/27/2014   HCT 39.0 02/27/2014   MCV 92.0 02/27/2014   PLT 276 02/27/2014      Chemistry      Component Value Date/Time   NA 140 02/27/2014 1049   NA 138 02/07/2014   NA 134* 08/30/2013 0453   K 3.9 02/27/2014 1049   K 3.7 02/07/2014   CL 94* 08/30/2013 0453   CL 102 09/25/2012 1002   CO2 31* 02/27/2014 1049   CO2 29 08/30/2013 0453   BUN 15.9 02/27/2014 1049   BUN 19 02/07/2014   BUN 30* 08/30/2013 0453   CREATININE 0.9 02/27/2014 1049   CREATININE 0.6 02/07/2014   CREATININE 0.85 08/30/2013 0453   CREATININE 0.56 01/23/2013 0720   GLU 88 02/07/2014      Component Value Date/Time   CALCIUM 9.7 02/27/2014 1049   CALCIUM 9.4 08/30/2013 0453   ALKPHOS 72 02/27/2014 1049   ALKPHOS 52 02/07/2014   AST 16 02/27/2014 1049   AST 17 02/07/2014   ALT 9 02/27/2014 1049   ALT 10 02/07/2014   BILITOT 0.32 02/27/2014 1049   BILITOT 0.3 08/27/2013 1150      ASSESSMENT & PLAN:  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 Her blood counts are stable. Clinically, skin rash and desquamation is slightly better. I recommend continue the same dose for another month. If I do not see any major improvement of her blood work, I recommend to increase it to 4 tablets a day. The patient will continue blood counts at the nursing home every other week.   multiple other comorbidities are stable. She does not seem to have decompensation of congestive heart failure.  All questions were answered. The patient knows to call the clinic with any problems, questions or concerns. No barriers to learning was detected. I spent 15 minutes counseling the patient face to face. The total time spent in the appointment was 20 minutes and more than 50% was on counseling and review of test results     [facility], [place], MD 02/27/2014 8:09 PM

## 2014-02-27 NOTE — Telephone Encounter (Signed)
gave pt appt for lab and Md for September

## 2014-03-04 ENCOUNTER — Other Ambulatory Visit: Payer: Self-pay | Admitting: Nurse Practitioner

## 2014-03-12 LAB — CBC AND DIFFERENTIAL
HCT: 39 % (ref 36–46)
Hemoglobin: 12.4 g/dL (ref 12.0–16.0)
Platelets: 232 10*3/uL (ref 150–399)
WBC: 27 10^3/mL

## 2014-03-13 ENCOUNTER — Telehealth: Payer: Self-pay | Admitting: *Deleted

## 2014-03-13 ENCOUNTER — Encounter: Payer: Self-pay | Admitting: *Deleted

## 2014-03-13 ENCOUNTER — Other Ambulatory Visit: Payer: Self-pay | Admitting: Nurse Practitioner

## 2014-03-13 NOTE — Progress Notes (Signed)
CBC results from 03/12/14 received from [facility].  Placed on Dr. Calton Dach desk for her review.

## 2014-03-13 NOTE — Telephone Encounter (Signed)
RN, Rose from [facility] left VM asking for ICD code for CBC they drew.  Called back and LVM informing ICD for Mycosis Fungoides is 202.56.   Dr. Alvy Bimler reviewed CBC and no change on Targretin.  Continue to check CBC every 2 weeks as ordered.  Asked Rose to call us back if any questions or concerns.  Called pt's daughter and informed her of CBC ok and no change in Targretin dose per Dr. Alvy Bimler.  She verbalized understanding.

## 2014-03-22 ENCOUNTER — Encounter: Payer: Self-pay | Admitting: Internal Medicine

## 2014-03-22 ENCOUNTER — Encounter: Payer: Self-pay | Admitting: Cardiovascular Disease

## 2014-03-25 ENCOUNTER — Non-Acute Institutional Stay (SKILLED_NURSING_FACILITY): Payer: Medicare Other | Admitting: Nurse Practitioner

## 2014-03-25 ENCOUNTER — Encounter: Payer: Self-pay | Admitting: Nurse Practitioner

## 2014-03-25 DIAGNOSIS — K219 Gastro-esophageal reflux disease without esophagitis: Secondary | ICD-10-CM

## 2014-03-25 DIAGNOSIS — I480 Paroxysmal atrial fibrillation: Secondary | ICD-10-CM

## 2014-03-25 DIAGNOSIS — K5900 Constipation, unspecified: Secondary | ICD-10-CM

## 2014-03-25 DIAGNOSIS — G547 Phantom limb syndrome without pain: Secondary | ICD-10-CM

## 2014-03-25 DIAGNOSIS — I4891 Unspecified atrial fibrillation: Secondary | ICD-10-CM

## 2014-03-25 DIAGNOSIS — I10 Essential (primary) hypertension: Secondary | ICD-10-CM

## 2014-03-25 DIAGNOSIS — G546 Phantom limb syndrome with pain: Secondary | ICD-10-CM

## 2014-03-25 DIAGNOSIS — I5032 Chronic diastolic (congestive) heart failure: Secondary | ICD-10-CM

## 2014-03-25 NOTE — Progress Notes (Signed)
Patient ID: Sara [name], female   DOB: 05-10-1919, 80 y.o.   MRN: [id number]   Code Status: DNR  Allergies  Allergen Reactions  . Evista [Raloxifene]   . Penicillins Other (See Comments)    unknown  . Triamterene Other (See Comments)    unknown  . Alendronate Sodium Rash    Pt stated oh k to use biotene    Chief Complaint  Patient presents with  . Medical Management of Chronic Issues    HPI: Patient is a 80 y.o. female seen in the SNF at [facility] today for evaluation of chronic medical conditions.   Hospitalization from 08/27/2013-08/30/2013 for CHF-Furosemide was increased to 60mg  bid and decreased Carvedilol due to bradycardia. Had proBNP 2534, d-dimer 2.97, chest x-ray with central vascular congestion and bilateral pleural effusion.  CBC showed WBC 32.8. BMP was unremarkable.   Problem List Items Addressed This Visit   Chronic diastolic HF (heart failure) - Primary (Chronic)     DOE, BNP 355.7 02/22/14. CXR 02/21/14 minimal to mild pulmonary venous hypertension w/o other overt congestive heart failure.     Phantom limb pain (Chronic)     Right AKA--managed with Gabapentin 100mg  nightly.       HTN (hypertension)     Controlled, takes Coreg 12,5mg  bid, Cardizem DC 120mg  ,  Imdur 30mg ,  and lisinopril 5mg       PAF (paroxysmal atrial fibrillation)     bradycardic to the 30s and 40s intermittently with occasional pauses up to and just greater than 2 seconds while in hospital. Resolved to 60s after Carvedilol was slightly reduced. Continued anticoagulation with pradaxa and Diltiazem 120mg  daily      Unspecified constipation     Managed with MiraLax qod     GERD (gastroesophageal reflux disease)     Stable, takes Famotidine 40mg  daily.          Review of Systems:  Review of Systems  Constitutional: Negative for fever, chills, weight loss, malaise/fatigue and diaphoresis.  HENT: Positive for hearing loss. Negative for congestion, ear discharge, ear pain  and sore throat.   Eyes: Negative for pain, discharge and redness.  Respiratory: Positive for cough. Negative for sputum production, shortness of breath and wheezing.        DOE  Cardiovascular: Positive for leg swelling and PND. Negative for chest pain, palpitations, orthopnea and claudication.       Trace only LLE  Gastrointestinal: Negative for heartburn, nausea, vomiting, abdominal pain, diarrhea, constipation and blood in stool.  Genitourinary: Positive for frequency (incontinent of bladder). Negative for dysuria, urgency, hematuria and flank pain.  Musculoskeletal: Positive for joint pain. Negative for back pain, falls, myalgias and neck pain.  Skin: Positive for itching and rash.       Chronic psoriasis, very thin and red skin, frequent skin abrasion or tear. Left mid shin hematoma  Neurological: Negative for dizziness, tingling, tremors, sensory change, speech change, focal weakness, seizures, loss of consciousness, weakness (generalized) and headaches.  Endo/Heme/Allergies: Negative for environmental allergies and polydipsia. Bruises/bleeds easily.  Psychiatric/Behavioral: Positive for memory loss. Negative for depression and hallucinations. The patient is not nervous/anxious and does not have insomnia.      Past Medical History  Diagnosis Date  . Hypertension   . Atrial fibrillation   . Psoriasis   . Congestive heart failure (CHF)   . History of CVA (cerebrovascular accident)   . Coronary artery disease   . Myocardial infarction   . CHF (congestive heart failure)   .  Shortness of breath   . H/O hiatal hernia   . Closed fracture of head of left humerus 5/12  . Basal cell carcinoma   . 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  Flow cytometry 04/04/12: 91% cells CD4 positive CD 26 negative  . Hyperlipemia   . Pneumonia   . Wound of  right leg 09/25/2012    Necrotic, open wound right lower leg; non healing  . Stroke   . Hyposmolality and/or hyponatremia   . Lower limb amputation, above knee 10/11/2012  . Pain in joint, multiple sites   . Mycosis fungoides, unspecified site, extranodal and solid organ sites 09/28/2012  . Occlusion and stenosis of carotid artery without mention of cerebral infarction 09/28/2012  . Chronic venous hypertension with ulcer and inflammation 09/28/2012  . Diverticulosis of colon (without mention of hemorrhage) 09/28/2012  . Synovial cyst of popliteal space 03/26/2012  . Unspecified constipation 03/24/2012  . Hypopotassemia 01/26/2012  . Debility, unspecified 01/17/2012  . Anxiety state, unspecified 12/31/2011  . Anemia, unspecified 12/21/2011  . Other specified disease of white blood cells 12/21/2011  . Peripheral vascular disease, unspecified 12/21/2011    recent ABI Lt of 0.64 down from ).80 on 12/29/11- though different labs  . Reflux esophagitis 12/21/2011  . Unspecified hereditary and idiopathic peripheral neuropathy 12/14/2011  . Pain in joint, ankle and foot 12/14/2011  . Carotid stenosis     CAROTID DOPPLER, 05/02/2012 - LEFT VERTEBRAL-occluded, LEFT BULB AND PROXIMAL ICA STENT-moderate amount of irregular mixed dense plaque 50-69% diameter reduction  . Pre-syncope     NUCLEAR STRESS TEST, 12/10/2009 - normal, EKG negative for ischemia  . TIA (transient ischemic attack)     2D ECHO, 12/05/2012 - EF 60-65%, Severely calcified annulus of the mitral valve with mild-moderate regurgitation, LA moderate-severely dilated  . Paroxysmal atrial fibrillation   . CAD in native artery     3 vessel CAD, medical therapy, not a candidate for CABG  . S/P AKA (above knee amputation) unilateral, Rt leg due to PAD and non healing wound 06/27/2013  . HCAP (healthcare-associated pneumonia) 01/08/2012   Past Surgical History  Procedure Laterality Date  . Tonsillectomy    . Dilation and curettage of uterus      . Eye surgery    . Cataracts    . Vertebroplasty    . Amputation  10/10/2012    Procedure: AMPUTATION ABOVE KNEE;  Surgeon: Newt Minion, MD;  Location: [facility];  Service: Orthopedics;  Laterality: Right;  . Percutaneous stent intervention  04/13/2011    Left internal carotid artery stented with a 8x30 prcise nitinol self-expanding stent resulting reduction of 90% to less than 20% residual  . Cardiac catheterization  11/06/2010    CABG recommended, turned down by [facility] secondary to co morbidities   Social History:   reports that she has never smoked. She has never used smokeless tobacco. She reports that she does not drink alcohol or use illicit drugs.  Family History  Problem Relation Age of Onset  . Stomach cancer Mother   . Pneumonia Father   . Cirrhosis Brother   . Diabetes Brother   . Heart attack Paternal Grandmother   . Heart failure Sister   . Diabetes Sister   . Heart failure Sister     Medications: Patient's Medications  New Prescriptions   No medications on file  Previous Medications   ATORVASTATIN (LIPITOR) 40 MG TABLET    Take 1 tablet (40 mg total) by mouth daily at 6 PM.   BEXAROTENE (TARGRETIN) 75 MG CAPS CAPSULE    Take 3 capsules (225 mg total) by mouth daily before supper. Give with food. Protect from light. CAUTION: Chemotherapy/Biotherapy-Wear gloves when handling   CARVEDILOL (COREG) 12.5 MG TABLET    Take 1 tablet (12.5 mg total) by mouth 2 (two) times daily with a meal.   DABIGATRAN (PRADAXA) 150 MG CAPS    Take 75 mg by mouth every 12 (twelve) hours.    DILTIAZEM (CARDIZEM CD) 120 MG 24 HR CAPSULE    Take 120 mg by mouth daily. Take 1 tablet once daily for BP.   FAMOTIDINE (PEPCID) 20 MG TABLET    Take 40 mg by mouth daily.   FOLIC ACID (FOLVITE) 1 MG TABLET    Take 1 mg by mouth daily.   FUROSEMIDE (LASIX) 40 MG TABLET    Take 1.5 tablets (60 mg total) by mouth 2 (two) times daily.   GABAPENTIN (NEURONTIN) 100 MG CAPSULE    Take 1 capsule (100 mg total) by  mouth at bedtime.   GUAIFENESIN (MUCINEX) 600 MG 12 HR TABLET    Take by mouth 2 (two) times daily.   IPRATROPIUM-ALBUTEROL (DUONEB) 0.5-2.5 (3) MG/3ML SOLN    Take 3 mLs by nebulization every 6 (six) hours as needed.    ISOSORBIDE MONONITRATE (IMDUR) 30 MG 24 HR TABLET    Take 1 tablet (30 mg total) by mouth daily.   LEVOCETIRIZINE (XYZAL) 5 MG TABLET    Take 5 mg by mouth as needed for allergies.   LISINOPRIL (PRINIVIL,ZESTRIL) 5 MG TABLET    Take 5 mg by mouth daily.   NITROGLYCERIN (NITROSTAT) 0.4 MG SL TABLET    Place 0.4 mg under the tongue every 5 (five) minutes as needed for chest pain. If no relief call MD.   OMEGA-3 FATTY ACIDS (FISH OIL) 1000 MG CAPS    Take 1,000 mg by mouth at bedtime.    POLYETHYLENE GLYCOL (MIRALAX / GLYCOLAX) PACKET    Take 17 g by mouth every other day. Take 1 packet mixed in 4 oz of beverage every day to relieve constipation.   POLYVINYL ALCOHOL-POVIDONE (REFRESH OP)    Apply 1 drop to eye 4 (four) times daily. Instill 1 drop to both eyes four times daily.*Wait 3-5 minutes between eye drops*   TRIAMCINOLONE CREAM (KENALOG) 0.1 %    Apply topically as directed.   VITAMIN C (ASCORBIC ACID) 500 MG TABLET    Take 500 mg by mouth daily.  Modified Medications   No medications on file  Discontinued Medications   No medications on file     Physical Exam: Physical Exam  Constitutional: She is oriented to person, place, and time. She appears well-developed and well-nourished. No distress.  HENT:  Head: Normocephalic.  Right Ear: External ear normal.  Left Ear: External ear normal.  Nose: Nose normal.  Mouth/Throat: Oropharynx is clear and moist. No oropharyngeal exudate.  Eyes: EOM are normal. Pupils are equal, round, and reactive to light. Right conjunctiva is injected.  Only a few eyelashes remained and right conjunctiva mild irritated with bilateral lower eyelid eversion.   Neck: Normal range of motion. Neck supple. JVD present. No thyromegaly present.    Cardiovascular: Normal rate, regular rhythm and normal heart sounds.  Exam reveals decreased pulses.   No murmur heard. Pulses:  Dorsalis pedis pulses are 1+ on the left side.       Posterior tibial pulses are 1+ on the left side.  Pulmonary/Chest: Effort normal and breath sounds normal. No respiratory distress. She has no wheezes. She exhibits no tenderness.  Decreased breath sounds posterior mid lungs.   Abdominal: Soft. Bowel sounds are normal. She exhibits no distension. There is no tenderness.  Genitourinary:  Incontinent of bladder  Musculoskeletal: Normal range of motion. She exhibits edema. She exhibits no tenderness.  R AKA. Left lower leg edema appears worse 1+  Lymphadenopathy:    She has no cervical adenopathy.  Neurological: She is alert and oriented to person, place, and time. She has normal reflexes. No cranial nerve deficit. She exhibits normal muscle tone. Coordination normal.  Skin: Rash noted. There is erythema.  Diffused erythematous scaly skin appearance. Psoriasis. Anterior left shin hematoma.   Psychiatric: She has a normal mood and affect. Her behavior is normal. Judgment and thought content normal. She exhibits abnormal recent memory.    Filed Vitals:   03/25/14 1835  BP: 142/82  Pulse: 66  Temp: 97.3 F (36.3 C)  TempSrc: Tympanic  Resp: 16      Labs reviewed: Basic Metabolic Panel:  Recent Labs  08/02/13  08/27/13 1835 08/28/13 0445 08/29/13 0456 08/30/13 0453 09/11/13 1527  10/23/13 1406  12/28/13 1417 12/28/13 1417 02/07/14 02/27/14 02/27/14 1049  NA 140  < >  --  137 139 134*  --   < > 142  < >  --  139 138 140 140  K 4.3  < >  --  3.7 3.7 4.0  --   < > 4.0  < >  --  3.6 3.7 3.9 3.9  CL  --   < >  --  97 98 94*  --   --   --   --   --   --   --   --   --   CO2  --   < >  --  31 29 29   --   < > 33*  --   --  28  --   --  31*  GLUCOSE  --   < >  --  97 97 103*  --   < > 102  --   --  130  --   --  161*  BUN 13  < >  --  17 23 30*   --   < > 13.6  < >  --  15.1 19 16  15.9  CREATININE 0.6  < >  --  0.72 0.79 0.85  --   < > 0.8  < >  --  0.8 0.6 0.9 0.9  CALCIUM  --   < >  --  9.5 9.5 9.4  --   < > 9.8  --   --  9.6  --   --  9.7  MG  --   --  2.0  --   --   --   --   --   --   --   --   --   --   --   --   TSH 0.99  --   --   --   --   --  1.009  --   --   --  1.393  --   --   --   --   < > = values in this interval not displayed. Liver Function  Tests:  Recent Labs  10/23/13 1406 12/28/13 1417 02/07/14 02/27/14 02/27/14 1049  AST 20 18 17 16 16   ALT 11 8 10 9 9   ALKPHOS 74 67 52 72 72  BILITOT 0.31 0.29  --   --  0.32  PROT 7.1 6.9  --   --  7.2  ALBUMIN 3.7 3.4*  --   --  3.5    Recent Labs  07/24/13 1415 09/11/13 1529  LIPASE 20 29  AMYLASE 20 22   CBC:  Recent Labs  10/23/13 1406 12/28/13 1417  02/27/14 02/27/14 1049 03/12/14  WBC 28.2* 27.1*  < > 26.5 26.5* 27.0  NEUTROABS 5.8 5.3  --   --  5.5  --   HGB 13.6 12.3  < > 12.8 12.8 12.4  HCT 41.7 38.3  < > 28* 39.0 39  MCV 93.0 94.1  --   --  92.0  --   PLT 239 263  < > 276 276 232  < > = values in this interval not displayed. Lipid Panel:  Recent Labs  08/02/13 09/11/13 1529 12/28/13 1419  CHOL  --   --  220*  HDL  --   --  30*  LDLCALC  --   --  134*  TRIG 188* 303* 281*  CHOLHDL  --   --  7.3    Past Procedures:  Dg Chest Port 1 View  08/30/2013 CLINICAL DATA: History of CHF with worsening. EXAM: PORTABLE CHEST - 1 VIEW  IMPRESSION: The findings are consistent with the CHF with evidence of some mild improvement in the interstitial edema since the earlier study.   Dg Chest Portable 1 View  08/27/2013 CLINICAL DATA: Cough, congestion EXAM: PORTABLE CHEST - 1 VIEW IMPRESSION: Central vascular congestion without convincing pulmonary edema. Bilateral small pleural effusion with bilateral basilar atelectasis or infiltrate.  12/20/13 CXR no cardiomegaly or pulmonary vascular congestion, small bilateral pleural effusions unchanged, patchy  bibasilar atelectasis or pneumonia worse on the right and unchanged on the left, old granulomatous disease re demonstrated.   02/21/14 CXR minimal to mild pulmonary venous hypertension without other overt congestive heart failure findings, emphysema/COPD, bilateral subsegmental atelectatic changes, but negative for suspected focal pneumonia.   Assessment/Plan Chronic diastolic HF (heart failure) DOE, BNP 355.7 02/22/14. CXR 02/21/14 minimal to mild pulmonary venous hypertension w/o other overt congestive heart failure.   Unspecified constipation Managed with MiraLax qod   HTN (hypertension) Controlled, takes Coreg 12,5mg  bid, Cardizem DC 120mg  ,  Imdur 30mg ,  and lisinopril 5mg     CAD- 3V March 2012- medical Rx F/u Cardiology. Takes Isosorbide 30mg  daily. No reported angina.    PAF (paroxysmal atrial fibrillation) bradycardic to the 30s and 40s intermittently with occasional pauses up to and just greater than 2 seconds while in hospital. Resolved to 60s after Carvedilol was slightly reduced. Continued anticoagulation with pradaxa and Diltiazem 120mg  daily    GERD (gastroesophageal reflux disease) Stable, takes Famotidine 40mg  daily.     Phantom limb pain Right AKA--managed with Gabapentin 100mg  nightly.       Family/ Staff Communication: observe the patient  Goals of Care: SNF  Labs/tests ordered: none

## 2014-03-25 NOTE — Assessment & Plan Note (Signed)
DOE, BNP 355.7 02/22/14. CXR 02/21/14 minimal to mild pulmonary venous hypertension w/o other overt congestive heart failure.

## 2014-03-25 NOTE — Assessment & Plan Note (Signed)
bradycardic to the 30s and 40s intermittently with occasional pauses up to and just greater than 2 seconds while in hospital. Resolved to 60s after Carvedilol was slightly reduced. Continued anticoagulation with pradaxa and Diltiazem 120mg  daily

## 2014-03-25 NOTE — Assessment & Plan Note (Signed)
Controlled, takes Coreg 12,5mg  bid, Cardizem DC 120mg  ,  Imdur 30mg ,  and lisinopril 5mg 

## 2014-03-25 NOTE — Assessment & Plan Note (Signed)
F/u Cardiology. Takes Isosorbide 30mg  daily. No reported angina.

## 2014-03-25 NOTE — Assessment & Plan Note (Signed)
Right AKA--managed with Gabapentin 100mg  nightly.

## 2014-03-25 NOTE — Assessment & Plan Note (Signed)
Managed with MiraLax qod

## 2014-03-25 NOTE — Assessment & Plan Note (Signed)
Stable, takes Famotidine 40mg  daily.

## 2014-03-26 ENCOUNTER — Encounter: Payer: Self-pay | Admitting: *Deleted

## 2014-03-26 LAB — CBC AND DIFFERENTIAL
HCT: 37 % (ref 36–46)
Hemoglobin: 12.5 g/dL (ref 12.0–16.0)
PLATELETS: 215 10*3/uL (ref 150–399)
WBC: 27.4 10^3/mL

## 2014-03-26 NOTE — Progress Notes (Signed)
CBC received from [facility] and placed on Dr. Calton Dach desk for her review.

## 2014-03-27 ENCOUNTER — Other Ambulatory Visit: Payer: Self-pay | Admitting: Nurse Practitioner

## 2014-03-29 ENCOUNTER — Telehealth: Payer: Self-pay | Admitting: *Deleted

## 2014-03-29 NOTE — Telephone Encounter (Signed)
Daughter notified of results.

## 2014-03-29 NOTE — Telephone Encounter (Signed)
Notified to continue same dose of Targretin

## 2014-04-01 ENCOUNTER — Other Ambulatory Visit: Payer: Self-pay | Admitting: *Deleted

## 2014-04-01 DIAGNOSIS — C8404 Mycosis fungoides, lymph nodes of axilla and upper limb: Secondary | ICD-10-CM

## 2014-04-01 MED ORDER — BEXAROTENE 75 MG PO CAPS: 225.0000 mg | ORAL_CAPSULE | Freq: Every day | ORAL | Status: DC

## 2014-04-03 ENCOUNTER — Telehealth: Payer: Self-pay | Admitting: *Deleted

## 2014-04-03 ENCOUNTER — Other Ambulatory Visit: Payer: Self-pay | Admitting: *Deleted

## 2014-04-03 DIAGNOSIS — C8404 Mycosis fungoides, lymph nodes of axilla and upper limb: Secondary | ICD-10-CM

## 2014-04-03 MED ORDER — BEXAROTENE 75 MG PO CAPS: 225.0000 mg | ORAL_CAPSULE | Freq: Every day | ORAL | Status: DC

## 2014-04-03 NOTE — Telephone Encounter (Signed)
Received message from Sara [name], director of nursing at [facility] re: pt will take last dose of Targretin tomorrow.  Sara [name] was informed by [facility] ( former [facility] ) that Targretin will no longer be delivered to nursing homes due to fraud issues.  Pt is now living in a skilled nursing home. Spoke with [place].  New prescription sent to pharmacy ( signed by Dr. Marko Plume ).   Spoke with Sara [name] and informed her that she will be contacted to pick up Targretin for pt after pharmacist gets approval for pt's assistance. Sara [name]     [phone].

## 2014-04-09 ENCOUNTER — Telehealth: Payer: Self-pay | Admitting: *Deleted

## 2014-04-09 LAB — CBC AND DIFFERENTIAL
HCT: 37 % (ref 36–46)
Hemoglobin: 12.3 g/dL (ref 12.0–16.0)
PLATELETS: 203 10*3/uL (ref 150–399)
WBC: 28 10^3/mL

## 2014-04-09 NOTE — Telephone Encounter (Signed)
Called [facility] and notified them to increase Targretin to 4 tablets daily, continue CBC every 2 weeks

## 2014-04-10 ENCOUNTER — Other Ambulatory Visit: Payer: Self-pay | Admitting: Nurse Practitioner

## 2014-04-22 ENCOUNTER — Non-Acute Institutional Stay (SKILLED_NURSING_FACILITY): Payer: Medicare Other | Admitting: Nurse Practitioner

## 2014-04-22 ENCOUNTER — Encounter: Payer: Self-pay | Admitting: Nurse Practitioner

## 2014-04-22 DIAGNOSIS — I480 Paroxysmal atrial fibrillation: Secondary | ICD-10-CM

## 2014-04-22 DIAGNOSIS — K219 Gastro-esophageal reflux disease without esophagitis: Secondary | ICD-10-CM

## 2014-04-22 DIAGNOSIS — G546 Phantom limb syndrome with pain: Secondary | ICD-10-CM

## 2014-04-22 DIAGNOSIS — I5032 Chronic diastolic (congestive) heart failure: Secondary | ICD-10-CM

## 2014-04-22 DIAGNOSIS — K5900 Constipation, unspecified: Secondary | ICD-10-CM

## 2014-04-22 DIAGNOSIS — G547 Phantom limb syndrome without pain: Secondary | ICD-10-CM

## 2014-04-22 DIAGNOSIS — I4891 Unspecified atrial fibrillation: Secondary | ICD-10-CM

## 2014-04-22 DIAGNOSIS — I10 Essential (primary) hypertension: Secondary | ICD-10-CM

## 2014-04-22 NOTE — Assessment & Plan Note (Signed)
F/u Cardiology. Takes Isosorbide 30mg  daily. No reported angina.

## 2014-04-22 NOTE — Progress Notes (Signed)
Patient ID: Sara [name], female   DOB: October 06, 1918, 80 y.o.   MRN: [id number]   Code Status: DNR  Allergies  Allergen Reactions  . Evista [Raloxifene]   . Penicillins Other (See Comments)    unknown  . Triamterene Other (See Comments)    unknown  . Alendronate Sodium Rash    Pt stated oh k to use biotene    Chief Complaint  Patient presents with  . Medical Management of Chronic Issues    HPI: Patient is a 80 y.o. female seen in the SNF at [facility] today for evaluation of chronic medical conditions.   Hospitalization from 08/27/2013-08/30/2013 for CHF-Furosemide was increased to 60mg  bid and decreased Carvedilol due to bradycardia. Had proBNP 2534, d-dimer 2.97, chest x-ray with central vascular congestion and bilateral pleural effusion.  CBC showed WBC 32.8. BMP was unremarkable.   Problem List Items Addressed This Visit   Chronic diastolic HF (heart failure) (Chronic)     DOE, BNP 355.7 02/22/14. CXR 02/21/14 minimal to mild pulmonary venous hypertension w/o other overt congestive heart failure. Moist rales posterior lower lungs. Cardiology ASAP    Phantom limb pain (Chronic)     Right AKA--managed with Gabapentin 100mg  nightly    HTN (hypertension)     Controlled, takes Coreg 12,5mg  bid, Cardizem DC 120mg  ,  Imdur 30mg ,  and lisinopril 5mg        PAF (paroxysmal atrial fibrillation) - Primary     bradycardic to the 30s and 40s intermittently with occasional pauses up to and just greater than 2 seconds while in hospital. Resolved to 60s after Carvedilol was slightly reduced. Continued anticoagulation with pradaxa and Diltiazem 120mg  daily       Unspecified constipation     Managed with MiraLax qod      GERD (gastroesophageal reflux disease)     Stable, takes Famotidine 40mg  daily.          Review of Systems:  Review of Systems  Constitutional: Negative for fever, chills, weight loss, malaise/fatigue and diaphoresis.  HENT: Positive for hearing loss.  Negative for congestion, ear discharge, ear pain and sore throat.   Eyes: Negative for pain, discharge and redness.  Respiratory: Positive for cough and shortness of breath. Negative for sputum production and wheezing.        DOE  Cardiovascular: Positive for leg swelling and PND. Negative for chest pain, palpitations, orthopnea and claudication.       Trace only LLE  Gastrointestinal: Negative for heartburn, nausea, vomiting, abdominal pain, diarrhea, constipation and blood in stool.  Genitourinary: Positive for frequency (incontinent of bladder). Negative for dysuria, urgency, hematuria and flank pain.  Musculoskeletal: Positive for joint pain. Negative for back pain, falls, myalgias and neck pain.  Skin: Positive for itching and rash.       Chronic psoriasis, very thin and red skin, frequent skin abrasion or tear. Left mid shin hematoma  Neurological: Negative for dizziness, tingling, tremors, sensory change, speech change, focal weakness, seizures, loss of consciousness, weakness (generalized) and headaches.  Endo/Heme/Allergies: Negative for environmental allergies and polydipsia. Bruises/bleeds easily.  Psychiatric/Behavioral: Positive for memory loss. Negative for depression and hallucinations. The patient is not nervous/anxious and does not have insomnia.      Past Medical History  Diagnosis Date  . Hypertension   . Atrial fibrillation   . Psoriasis   . Congestive heart failure (CHF)   . History of CVA (cerebrovascular accident)   . Coronary artery disease   . Myocardial infarction   .  CHF (congestive heart failure)   . Shortness of breath   . H/O hiatal hernia   . Closed fracture of head of left humerus 5/12  . Basal cell carcinoma   . 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  Flow cytometry 04/04/12: 91% cells CD4 positive CD 26  negative  . Hyperlipemia   . Pneumonia   . Wound of right leg 09/25/2012    Necrotic, open wound right lower leg; non healing  . Stroke   . Hyposmolality and/or hyponatremia   . Lower limb amputation, above knee 10/11/2012  . Pain in joint, multiple sites   . Mycosis fungoides, unspecified site, extranodal and solid organ sites 09/28/2012  . Occlusion and stenosis of carotid artery without mention of cerebral infarction 09/28/2012  . Chronic venous hypertension with ulcer and inflammation 09/28/2012  . Diverticulosis of colon (without mention of hemorrhage) 09/28/2012  . Synovial cyst of popliteal space 03/26/2012  . Unspecified constipation 03/24/2012  . Hypopotassemia 01/26/2012  . Debility, unspecified 01/17/2012  . Anxiety state, unspecified 12/31/2011  . Anemia, unspecified 12/21/2011  . Other specified disease of white blood cells 12/21/2011  . Peripheral vascular disease, unspecified 12/21/2011    recent ABI Lt of 0.64 down from ).80 on 12/29/11- though different labs  . Reflux esophagitis 12/21/2011  . Unspecified hereditary and idiopathic peripheral neuropathy 12/14/2011  . Pain in joint, ankle and foot 12/14/2011  . Carotid stenosis     CAROTID DOPPLER, 05/02/2012 - LEFT VERTEBRAL-occluded, LEFT BULB AND PROXIMAL ICA STENT-moderate amount of irregular mixed dense plaque 50-69% diameter reduction  . Pre-syncope     NUCLEAR STRESS TEST, 12/10/2009 - normal, EKG negative for ischemia  . TIA (transient ischemic attack)     2D ECHO, 12/05/2012 - EF 60-65%, Severely calcified annulus of the mitral valve with mild-moderate regurgitation, LA moderate-severely dilated  . Paroxysmal atrial fibrillation   . CAD in native artery     3 vessel CAD, medical therapy, not a candidate for CABG  . S/P AKA (above knee amputation) unilateral, Rt leg due to PAD and non healing wound 06/27/2013  . HCAP (healthcare-associated pneumonia) 01/08/2012   Past Surgical History  Procedure Laterality Date  .  Tonsillectomy    . Dilation and curettage of uterus    . Eye surgery    . Cataracts    . Vertebroplasty    . Amputation  10/10/2012    Procedure: AMPUTATION ABOVE KNEE;  Surgeon: Newt Minion, MD;  Location: [facility];  Service: Orthopedics;  Laterality: Right;  . Percutaneous stent intervention  04/13/2011    Left internal carotid artery stented with a 8x30 prcise nitinol self-expanding stent resulting reduction of 90% to less than 20% residual  . Cardiac catheterization  11/06/2010    CABG recommended, turned down by [facility] secondary to co morbidities   Social History:   reports that she has never smoked. She has never used smokeless tobacco. She reports that she does not drink alcohol or use illicit drugs.  Family History  Problem Relation Age of Onset  . Stomach cancer Mother   . Pneumonia Father   . Cirrhosis Brother   . Diabetes Brother   . Heart attack Paternal Grandmother   . Heart failure Sister   . Diabetes Sister   . Heart failure Sister     Medications: Patient's Medications  New Prescriptions  No medications on file  Previous Medications   ATORVASTATIN (LIPITOR) 40 MG TABLET    Take 1 tablet (40 mg total) by mouth daily at 6 PM.   BEXAROTENE (TARGRETIN) 75 MG CAPS CAPSULE    Take 3 capsules (225 mg total) by mouth daily before supper. Give with food. Protect from light. CAUTION: Chemotherapy/Biotherapy-Wear gloves when handling   CARVEDILOL (COREG) 12.5 MG TABLET    Take 1 tablet (12.5 mg total) by mouth 2 (two) times daily with a meal.   DABIGATRAN (PRADAXA) 150 MG CAPS    Take 75 mg by mouth every 12 (twelve) hours.    DILTIAZEM (CARDIZEM CD) 120 MG 24 HR CAPSULE    Take 120 mg by mouth daily. Take 1 tablet once daily for BP.   FAMOTIDINE (PEPCID) 20 MG TABLET    Take 40 mg by mouth daily.   FOLIC ACID (FOLVITE) 1 MG TABLET    Take 1 mg by mouth daily.   FUROSEMIDE (LASIX) 40 MG TABLET    Take 1.5 tablets (60 mg total) by mouth 2 (two) times daily.   GABAPENTIN  (NEURONTIN) 100 MG CAPSULE    Take 1 capsule (100 mg total) by mouth at bedtime.   GUAIFENESIN (MUCINEX) 600 MG 12 HR TABLET    Take by mouth 2 (two) times daily.   IPRATROPIUM-ALBUTEROL (DUONEB) 0.5-2.5 (3) MG/3ML SOLN    Take 3 mLs by nebulization every 6 (six) hours as needed.    ISOSORBIDE MONONITRATE (IMDUR) 30 MG 24 HR TABLET    Take 1 tablet (30 mg total) by mouth daily.   LEVOCETIRIZINE (XYZAL) 5 MG TABLET    Take 5 mg by mouth as needed for allergies.   LISINOPRIL (PRINIVIL,ZESTRIL) 5 MG TABLET    Take 5 mg by mouth daily.   NITROGLYCERIN (NITROSTAT) 0.4 MG SL TABLET    Place 0.4 mg under the tongue every 5 (five) minutes as needed for chest pain. If no relief call MD.   OMEGA-3 FATTY ACIDS (FISH OIL) 1000 MG CAPS    Take 1,000 mg by mouth at bedtime.    POLYETHYLENE GLYCOL (MIRALAX / GLYCOLAX) PACKET    Take 17 g by mouth every other day. Take 1 packet mixed in 4 oz of beverage every day to relieve constipation.   POLYVINYL ALCOHOL-POVIDONE (REFRESH OP)    Apply 1 drop to eye 4 (four) times daily. Instill 1 drop to both eyes four times daily.*Wait 3-5 minutes between eye drops*   TRIAMCINOLONE CREAM (KENALOG) 0.1 %    Apply topically as directed.   VITAMIN C (ASCORBIC ACID) 500 MG TABLET    Take 500 mg by mouth daily.  Modified Medications   No medications on file  Discontinued Medications   No medications on file     Physical Exam: Physical Exam  Constitutional: She is oriented to person, place, and time. She appears well-developed and well-nourished. No distress.  HENT:  Head: Normocephalic.  Right Ear: External ear normal.  Left Ear: External ear normal.  Nose: Nose normal.  Mouth/Throat: Oropharynx is clear and moist. No oropharyngeal exudate.  Eyes: EOM are normal. Pupils are equal, round, and reactive to light. Right conjunctiva is injected.  Only a few eyelashes remained and right conjunctiva mild irritated with bilateral lower eyelid eversion.   Neck: Normal range of  motion. Neck supple. JVD present. No thyromegaly present.  Cardiovascular: Normal rate and regular rhythm.  Exam reveals decreased pulses.   Murmur heard. Pulses:  Dorsalis pedis pulses are 1+ on the left side.       Posterior tibial pulses are 1+ on the left side.  3/6  Pulmonary/Chest: Effort normal. No respiratory distress. She has no wheezes. She has rales. She exhibits no tenderness.  Decreased breath sounds posterior mid lungs. Moist rales posterior lower lungs.   Abdominal: Soft. Bowel sounds are normal. She exhibits no distension. There is no tenderness.  Genitourinary:  Incontinent of bladder  Musculoskeletal: Normal range of motion. She exhibits edema. She exhibits no tenderness.  R AKA. Left lower leg edema appears worse 1+  Lymphadenopathy:    She has no cervical adenopathy.  Neurological: She is alert and oriented to person, place, and time. She has normal reflexes. No cranial nerve deficit. She exhibits normal muscle tone. Coordination normal.  Skin: Rash noted. There is erythema.  Diffused erythematous scaly skin appearance. Psoriasis. Anterior left shin hematoma.   Psychiatric: She has a normal mood and affect. Her behavior is normal. Judgment and thought content normal. She exhibits abnormal recent memory.    Filed Vitals:   04/22/14 1711  BP: 140/78  Pulse: 66  Temp: 98.2 F (36.8 C)  TempSrc: Tympanic  Resp: 18      Labs reviewed: Basic Metabolic Panel:  Recent Labs  08/02/13  08/27/13 1835 08/28/13 0445 08/29/13 0456 08/30/13 0453 09/11/13 1527  10/23/13 1406  12/28/13 1417 12/28/13 1417 02/07/14 02/27/14 02/27/14 1049  NA 140  < >  --  137 139 134*  --   < > 142  < >  --  139 138 140 140  K 4.3  < >  --  3.7 3.7 4.0  --   < > 4.0  < >  --  3.6 3.7 3.9 3.9  CL  --   < >  --  97 98 94*  --   --   --   --   --   --   --   --   --   CO2  --   < >  --  31 29 29   --   < > 33*  --   --  28  --   --  31*  GLUCOSE  --   < >  --  97 97 103*  --   < >  102  --   --  130  --   --  161*  BUN 13  < >  --  17 23 30*  --   < > 13.6  < >  --  15.1 19 16  15.9  CREATININE 0.6  < >  --  0.72 0.79 0.85  --   < > 0.8  < >  --  0.8 0.6 0.9 0.9  CALCIUM  --   < >  --  9.5 9.5 9.4  --   < > 9.8  --   --  9.6  --   --  9.7  MG  --   --  2.0  --   --   --   --   --   --   --   --   --   --   --   --   TSH 0.99  --   --   --   --   --  1.009  --   --   --  1.393  --   --   --   --   < > = values in  this interval not displayed. Liver Function Tests:  Recent Labs  10/23/13 1406 12/28/13 1417 02/07/14 02/27/14 02/27/14 1049  AST 20 18 17 16 16   ALT 11 8 10 9 9   ALKPHOS 74 67 52 72 72  BILITOT 0.31 0.29  --   --  0.32  PROT 7.1 6.9  --   --  7.2  ALBUMIN 3.7 3.4*  --   --  3.5    Recent Labs  07/24/13 1415 09/11/13 1529  LIPASE 20 29  AMYLASE 20 22   CBC:  Recent Labs  10/23/13 1406 12/28/13 1417  02/27/14 1049  03/26/14 04/09/14 04/23/14  WBC 28.2* 27.1*  < > 26.5*  < > 27.4 28.0 27.5  NEUTROABS 5.8 5.3  --  5.5  --   --   --   --   HGB 13.6 12.3  < > 12.8  < > 12.5 12.3 12.1  HCT 41.7 38.3  < > 39.0  < > 37 37 36  MCV 93.0 94.1  --  92.0  --   --   --   --   PLT 239 263  < > 276  < > 215 203 204  < > = values in this interval not displayed. Lipid Panel:  Recent Labs  08/02/13 09/11/13 1529 12/28/13 1419  CHOL  --   --  220*  HDL  --   --  30*  LDLCALC  --   --  134*  TRIG 188* 303* 281*  CHOLHDL  --   --  7.3    Past Procedures:  Dg Chest Port 1 View  08/30/2013 CLINICAL DATA: History of CHF with worsening. EXAM: PORTABLE CHEST - 1 VIEW  IMPRESSION: The findings are consistent with the CHF with evidence of some mild improvement in the interstitial edema since the earlier study.   Dg Chest Portable 1 View  08/27/2013 CLINICAL DATA: Cough, congestion EXAM: PORTABLE CHEST - 1 VIEW IMPRESSION: Central vascular congestion without convincing pulmonary edema. Bilateral small pleural effusion with bilateral basilar atelectasis or  infiltrate.  12/20/13 CXR no cardiomegaly or pulmonary vascular congestion, small bilateral pleural effusions unchanged, patchy bibasilar atelectasis or pneumonia worse on the right and unchanged on the left, old granulomatous disease re demonstrated.   02/21/14 CXR minimal to mild pulmonary venous hypertension without other overt congestive heart failure findings, emphysema/COPD, bilateral subsegmental atelectatic changes, but negative for suspected focal pneumonia.   Assessment/Plan Unspecified constipation Managed with MiraLax qod    PAF (paroxysmal atrial fibrillation) bradycardic to the 30s and 40s intermittently with occasional pauses up to and just greater than 2 seconds while in hospital. Resolved to 60s after Carvedilol was slightly reduced. Continued anticoagulation with pradaxa and Diltiazem 120mg  daily     HTN (hypertension) Controlled, takes Coreg 12,5mg  bid, Cardizem DC 120mg  ,  Imdur 30mg ,  and lisinopril 5mg      CAD- 3V March 2012- medical Rx F/u Cardiology. Takes Isosorbide 30mg  daily. No reported angina.     Chronic diastolic HF (heart failure) DOE, BNP 355.7 02/22/14. CXR 02/21/14 minimal to mild pulmonary venous hypertension w/o other overt congestive heart failure. Moist rales posterior lower lungs. Cardiology ASAP  GERD (gastroesophageal reflux disease) Stable, takes Famotidine 40mg  daily.     Phantom limb pain Right AKA--managed with Gabapentin 100mg  nightly    Family/ Staff Communication: observe the patient. Cardiology consult ASAP  Goals of Care: SNF  Labs/tests ordered: none

## 2014-04-22 NOTE — Assessment & Plan Note (Signed)
Controlled, takes Coreg 12,5mg  bid, Cardizem DC 120mg  ,  Imdur 30mg ,  and lisinopril 5mg 

## 2014-04-22 NOTE — Assessment & Plan Note (Addendum)
DOE, BNP 355.7 02/22/14. CXR 02/21/14 minimal to mild pulmonary venous hypertension w/o other overt congestive heart failure. Moist rales posterior lower lungs. Cardiology ASAP

## 2014-04-22 NOTE — Assessment & Plan Note (Signed)
bradycardic to the 30s and 40s intermittently with occasional pauses up to and just greater than 2 seconds while in hospital. Resolved to 60s after Carvedilol was slightly reduced. Continued anticoagulation with pradaxa and Diltiazem 120mg  daily

## 2014-04-22 NOTE — Assessment & Plan Note (Signed)
Managed with MiraLax qod

## 2014-04-22 NOTE — Assessment & Plan Note (Signed)
Right AKA--managed with Gabapentin 100mg  nightly

## 2014-04-22 NOTE — Assessment & Plan Note (Signed)
Stable, takes Famotidine 40mg  daily.

## 2014-04-23 ENCOUNTER — Telehealth: Payer: Self-pay | Admitting: *Deleted

## 2014-04-23 LAB — CBC AND DIFFERENTIAL
HEMATOCRIT: 36 % (ref 36–46)
HEMOGLOBIN: 12.1 g/dL (ref 12.0–16.0)
PLATELETS: 204 10*3/uL (ref 150–399)
WBC: 27.5 10^3/mL

## 2014-04-23 NOTE — Telephone Encounter (Signed)
Received CBC from [place]. Faxed back instruction sheet- continue same dose of Targretin and CBC every 2 weeks

## 2014-05-07 LAB — CBC AND DIFFERENTIAL
HCT: 37 % (ref 36–46)
Hemoglobin: 12.1 g/dL (ref 12.0–16.0)
Platelets: 239 10*3/uL (ref 150–399)
WBC: 29 10^3/mL

## 2014-05-08 ENCOUNTER — Other Ambulatory Visit: Payer: Self-pay | Admitting: Nurse Practitioner

## 2014-05-09 ENCOUNTER — Telehealth: Payer: Self-pay | Admitting: *Deleted

## 2014-05-09 NOTE — Telephone Encounter (Signed)
Spoke with Water quality scientist at [facility].  Let her know that this form has been signed and faxed back to [facility].

## 2014-05-15 ENCOUNTER — Telehealth: Payer: Self-pay | Admitting: *Deleted

## 2014-05-15 NOTE — Telephone Encounter (Signed)
Received fax from [facility] stating they have approved Targretin until 04/03/16

## 2014-05-17 ENCOUNTER — Ambulatory Visit (INDEPENDENT_AMBULATORY_CARE_PROVIDER_SITE_OTHER): Payer: Medicare Other | Admitting: Physician Assistant

## 2014-05-17 ENCOUNTER — Encounter: Payer: Self-pay | Admitting: Physician Assistant

## 2014-05-17 VITALS — BP 140/80 | HR 72

## 2014-05-17 DIAGNOSIS — I2510 Atherosclerotic heart disease of native coronary artery without angina pectoris: Secondary | ICD-10-CM

## 2014-05-17 DIAGNOSIS — I4891 Unspecified atrial fibrillation: Secondary | ICD-10-CM

## 2014-05-17 DIAGNOSIS — R0602 Shortness of breath: Secondary | ICD-10-CM

## 2014-05-17 DIAGNOSIS — I10 Essential (primary) hypertension: Secondary | ICD-10-CM

## 2014-05-17 DIAGNOSIS — I480 Paroxysmal atrial fibrillation: Secondary | ICD-10-CM

## 2014-05-17 DIAGNOSIS — I5032 Chronic diastolic (congestive) heart failure: Secondary | ICD-10-CM

## 2014-05-17 DIAGNOSIS — E785 Hyperlipidemia, unspecified: Secondary | ICD-10-CM

## 2014-05-17 DIAGNOSIS — Z7901 Long term (current) use of anticoagulants: Secondary | ICD-10-CM

## 2014-05-17 NOTE — Assessment & Plan Note (Signed)
BP mildly elevated no changes to current medications.

## 2014-05-17 NOTE — Assessment & Plan Note (Signed)
Currently  no angina

## 2014-05-17 NOTE — Patient Instructions (Signed)
1.  Follow up with Dr. Gwenlyn Found in 3 months.

## 2014-05-17 NOTE — Assessment & Plan Note (Signed)
Patient's complaint is dyspnea when she raises her arms above her head. She does this when she is putting on her shirt. This can last several minutes.   [facility] until the episode subsides. We'll followup with her in 3 months

## 2014-05-17 NOTE — Assessment & Plan Note (Signed)
Continue statin. 

## 2014-05-17 NOTE — Assessment & Plan Note (Addendum)
Appears to be in sinus rhythm based on exam. Rate is very regular and controlled.  She is on Pradaxa

## 2014-05-17 NOTE — Assessment & Plan Note (Signed)
Appears euvolemic continue Lasix.

## 2014-05-17 NOTE — Progress Notes (Signed)
Date:  05/17/2014   ID:  Sara [name], DOB [birth year], MRN [id number]  PCP:  Estill Dooms, MD  Primary Cardiologist:  Gwenlyn Found    History of Present Illness: Sara [name] is a 80 y.o. female She was hospitalized first in January 2014 and had an above-the-knee amputation then she was readmitted for syncope felt to be symptomatic hypotension. The MRI of the brain was negative for acute infarct. She had an echo and carotid Dopplers that were stable and no other issues. She was given IV fluids. At that time, she was on aspirin and Plavix. Her carvedilol was down to 12.5 mg twice a day and her lisinopril was at 5 mg a day, previously had been at 40. She was readmitted in February due to dyspnea on exertion and nonhealing wound. She improved from that hospitalization.  we were consulted on any of these hospitalizations. On December 03, 2012, she was readmitted with acute stroke. She was having speech difficulties. She knew what she wanted to say but she could not get the words out.  She was found to be in A-fib with RVR and medications were adjusted including increasing her carvedilol back to 25 mg twice a day. She was discharged on December 05, 2012, and is in the skilled nursing facility at [facility] for rehab from her AKA as well as her CVA, though, she has not much residual from the CVA. At the time of discharge, on December 03, 2012, she was in sinus rhythm. During that hospitalization she had A-fib with RVR but on arrival to [facility] she was back in sinus rhythm as well. Since that time, she stabilized. Previously, we only had her on aspirin and Plavix, but due to the new right-sided CVA, most likely embolic from her A-fib, we added Pradaxa and stopped her aspirin completely. Additionally, in the past she has been diagnosed with psoriasis but she has been followed with Dr. 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 Other history does include  coronary artery disease, significant 3-vessel disease and is not felt to be a good bypass candidate. She was seen by Dr. Cyndia Bent in the past and medical therapy only has been recommended. She has also had a high-grade left internal carotid artery stenosis that Dr. Gwenlyn Found stented July 2012 under the [facility] protocol. Dopplers during this year with hospitalization reveal that the stent was widely patent.   The patient overall has done fairly well. She feels much better now and she is being treated for her skin lymphoma. [facility]. Should reports the food is not very good.  Her main complaint is that of shortness of breath when she raises her arms above her head. She must do this every time she puts on her shirt. When this occurs, the nurse put some oxygen on and the symptoms resolved.  It may take several minutes.  She also reports some left lower extremity edema at times. She currently denies nausea, vomiting, fever, chest pain, orthopnea, dizziness, PND, cough, congestion, abdominal pain, hematochezia, melena.  Wt Readings from Last 3 Encounters:  02/21/14 142 lb (64.411 kg)  12/11/13 138 lb 1.6 oz (62.642 kg)  09/12/13 132 lb (59.875 kg)     Past Medical History  Diagnosis Date  . Hypertension   . Atrial fibrillation   . Psoriasis   . Congestive heart failure (CHF)   . History of CVA (  cerebrovascular accident)   . Coronary artery disease   . Myocardial infarction   . CHF (congestive heart failure)   . Shortness of breath   . H/O hiatal hernia   . Closed fracture of head of left humerus 5/12  . Basal cell carcinoma   . 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  Flow cytometry 04/04/12: 91% cells CD4 positive CD 26 negative  . Hyperlipemia   . Pneumonia   . Wound of right leg 09/25/2012    Necrotic, open wound right lower  leg; non healing  . Stroke   . Hyposmolality and/or hyponatremia   . Lower limb amputation, above knee 10/11/2012  . Pain in joint, multiple sites   . Mycosis fungoides, unspecified site, extranodal and solid organ sites 09/28/2012  . Occlusion and stenosis of carotid artery without mention of cerebral infarction 09/28/2012  . Chronic venous hypertension with ulcer and inflammation 09/28/2012  . Diverticulosis of colon (without mention of hemorrhage) 09/28/2012  . Synovial cyst of popliteal space 03/26/2012  . Unspecified constipation 03/24/2012  . Hypopotassemia 01/26/2012  . Debility, unspecified 01/17/2012  . Anxiety state, unspecified 12/31/2011  . Anemia, unspecified 12/21/2011  . Other specified disease of white blood cells 12/21/2011  . Peripheral vascular disease, unspecified 12/21/2011    recent ABI Lt of 0.64 down from ).80 on 12/29/11- though different labs  . Reflux esophagitis 12/21/2011  . Unspecified hereditary and idiopathic peripheral neuropathy 12/14/2011  . Pain in joint, ankle and foot 12/14/2011  . Carotid stenosis     CAROTID DOPPLER, 05/02/2012 - LEFT VERTEBRAL-occluded, LEFT BULB AND PROXIMAL ICA STENT-moderate amount of irregular mixed dense plaque 50-69% diameter reduction  . Pre-syncope     NUCLEAR STRESS TEST, 12/10/2009 - normal, EKG negative for ischemia  . TIA (transient ischemic attack)     2D ECHO, 12/05/2012 - EF 60-65%, Severely calcified annulus of the mitral valve with mild-moderate regurgitation, LA moderate-severely dilated  . Paroxysmal atrial fibrillation   . CAD in native artery     3 vessel CAD, medical therapy, not a candidate for CABG  . S/P AKA (above knee amputation) unilateral, Rt leg due to PAD and non healing wound 06/27/2013  . HCAP (healthcare-associated pneumonia) 01/08/2012    Current Outpatient Prescriptions  Medication Sig Dispense Refill  . atorvastatin (LIPITOR) 40 MG tablet Take 1 tablet (40 mg total) by mouth daily at 6 PM.      .  bexarotene (TARGRETIN) 75 MG CAPS capsule Take 3 capsules (225 mg total) by mouth daily before supper. Give with food. Protect from light. CAUTION: Chemotherapy/Biotherapy-Wear gloves when handling  270 capsule  0  . carvedilol (COREG) 12.5 MG tablet Take 1 tablet (12.5 mg total) by mouth 2 (two) times daily with a meal.  60 tablet  0  . dabigatran (PRADAXA) 150 MG CAPS Take 75 mg by mouth every 12 (twelve) hours.       Marland Kitchen diltiazem (CARDIZEM CD) 120 MG 24 hr capsule Take 120 mg by mouth daily. Take 1 tablet once daily for BP.      . famotidine (PEPCID) 20 MG tablet Take 40 mg by mouth daily.      . folic acid (FOLVITE) 1 MG tablet Take 1 mg by mouth daily.      . furosemide (LASIX) 40 MG tablet Take 1.5 tablets (60 mg total) by mouth  2 (two) times daily.  30 tablet  0  . gabapentin (NEURONTIN) 100 MG capsule Take 1 capsule (100 mg total) by mouth at bedtime.      Marland Kitchen guaiFENesin (MUCINEX) 600 MG 12 hr tablet Take by mouth 2 (two) times daily.      Marland Kitchen ipratropium-albuterol (DUONEB) 0.5-2.5 (3) MG/3ML SOLN Take 3 mLs by nebulization every 6 (six) hours as needed.       . isosorbide mononitrate (IMDUR) 30 MG 24 hr tablet Take 1 tablet (30 mg total) by mouth daily.  30 tablet  6  . levocetirizine (XYZAL) 5 MG tablet Take 5 mg by mouth as needed for allergies.      Marland Kitchen lisinopril (PRINIVIL,ZESTRIL) 5 MG tablet Take 5 mg by mouth daily.      . nitroGLYCERIN (NITROSTAT) 0.4 MG SL tablet Place 0.4 mg under the tongue every 5 (five) minutes as needed for chest pain. If no relief call MD.      . Omega-3 Fatty Acids (FISH OIL) 1000 MG CAPS Take 1,000 mg by mouth at bedtime.       . polyethylene glycol (MIRALAX / GLYCOLAX) packet Take 17 g by mouth every other day. Take 1 packet mixed in 4 oz of beverage every day to relieve constipation.      . Polyvinyl Alcohol-Povidone (REFRESH OP) Apply 1 drop to eye 4 (four) times daily. Instill 1 drop to both eyes four times daily.*Wait 3-5 minutes between eye drops*      .  triamcinolone cream (KENALOG) 0.1 % Apply topically as directed.      . vitamin C (ASCORBIC ACID) 500 MG tablet Take 500 mg by mouth daily.       No current facility-administered medications for this visit.    Allergies:    Allergies  Allergen Reactions  . Evista [Raloxifene]   . Penicillins Other (See Comments)    unknown  . Triamterene Other (See Comments)    unknown  . Alendronate Sodium Rash    Pt stated oh k to use biotene    Social History:  The patient  reports that she has never smoked. She has never used smokeless tobacco. She reports that she does not drink alcohol or use illicit drugs.   Family history:   Family History  Problem Relation Age of Onset  . Stomach cancer Mother   . Pneumonia Father   . Cirrhosis Brother   . Diabetes Brother   . Heart attack Paternal Grandmother   . Heart failure Sister   . Diabetes Sister   . Heart failure Sister     ROS:  Please see the history of present illness.  All other systems reviewed and negative.   PHYSICAL EXAM: VS:  BP 140/80  Pulse 72 Well nourished, well developed, in no acute distress HEENT: Pupils are equal round react to light accommodation extraocular movements are intact.  Neck: no JVDNo cervical lymphadenopathy. Bilateral carotid bruit Cardiac: 1/6 systolic murmur Regular rate and rhythm  Lungs:  clear to auscultation bilaterally, no wheezing, rhonchi or rales Abd: soft, nontender, positive bowel sounds all quadrants,  Ext: Trace left lower extremity edema.  2+ radial and 2+ left dorsalis pedis pulses. Skin: warm and dry Neuro:  Grossly normal  EKG:  None  ASSESSMENT AND PLAN:  Problem List Items Addressed This Visit   Chronic diastolic HF (heart failure) (Chronic)     Appears euvolemic continue Lasix.    CAD- 3V March 2012- medical Rx (Chronic)  Currently  no angina    Chronic anticoagulation- Pradaxa (Chronic)   SOB (shortness of breath)     Patient's complaint is dyspnea when she raises  her arms above her head. She does this when she is putting on her shirt. This can last several minutes.   [facility] on her until the episode subsides. We'll followup with her in 3 months    HTN (hypertension)     BP mildly elevated no changes to current medications.    PAF (paroxysmal atrial fibrillation) - Primary     Appears to be in sinus rhythm based on exam. Rate is very regular and controlled.  She is on Pradaxa    Hyperlipidemia     Continue statin

## 2014-05-21 LAB — CBC AND DIFFERENTIAL
HEMATOCRIT: 38 % (ref 36–46)
HEMOGLOBIN: 12.3 g/dL (ref 12.0–16.0)
Platelets: 238 10*3/uL (ref 150–399)
WBC: 25.7 10^3/mL

## 2014-05-23 ENCOUNTER — Other Ambulatory Visit: Payer: Self-pay | Admitting: Nurse Practitioner

## 2014-05-28 ENCOUNTER — Encounter: Payer: Self-pay | Admitting: Hematology and Oncology

## 2014-05-28 ENCOUNTER — Other Ambulatory Visit: Payer: Self-pay | Admitting: Hematology and Oncology

## 2014-05-28 DIAGNOSIS — C8448 Peripheral T-cell lymphoma, not classified, lymph nodes of multiple sites: Secondary | ICD-10-CM

## 2014-05-28 HISTORY — DX: Peripheral t-cell lymphoma, not elsewhere classified, lymph nodes of multiple sites: C84.48

## 2014-05-29 ENCOUNTER — Encounter: Payer: Self-pay | Admitting: Nurse Practitioner

## 2014-05-29 ENCOUNTER — Non-Acute Institutional Stay (SKILLED_NURSING_FACILITY): Payer: Medicare Other | Admitting: Nurse Practitioner

## 2014-05-29 DIAGNOSIS — K219 Gastro-esophageal reflux disease without esophagitis: Secondary | ICD-10-CM

## 2014-05-29 DIAGNOSIS — I480 Paroxysmal atrial fibrillation: Secondary | ICD-10-CM

## 2014-05-29 DIAGNOSIS — G546 Phantom limb syndrome with pain: Secondary | ICD-10-CM

## 2014-05-29 DIAGNOSIS — C8400 Mycosis fungoides, unspecified site: Secondary | ICD-10-CM

## 2014-05-29 DIAGNOSIS — K5900 Constipation, unspecified: Secondary | ICD-10-CM

## 2014-05-29 DIAGNOSIS — I4891 Unspecified atrial fibrillation: Secondary | ICD-10-CM

## 2014-05-29 DIAGNOSIS — I5032 Chronic diastolic (congestive) heart failure: Secondary | ICD-10-CM

## 2014-05-29 DIAGNOSIS — I10 Essential (primary) hypertension: Secondary | ICD-10-CM

## 2014-05-29 DIAGNOSIS — G547 Phantom limb syndrome without pain: Secondary | ICD-10-CM

## 2014-05-29 DIAGNOSIS — C8409 Mycosis fungoides, extranodal and solid organ sites: Secondary | ICD-10-CM

## 2014-05-29 DIAGNOSIS — C8448 Peripheral T-cell lymphoma, not classified, lymph nodes of multiple sites: Secondary | ICD-10-CM

## 2014-05-29 NOTE — Assessment & Plan Note (Signed)
OE, BNP 355.7 02/22/14. CXR 02/21/14 minimal to mild pulmonary venous hypertension w/o other overt congestive heart failure. Moist rales posterior lower lungs. Cardiology

## 2014-05-29 NOTE — Assessment & Plan Note (Signed)
Stable, takes Famotidine 40mg  daily.

## 2014-05-29 NOTE — Assessment & Plan Note (Signed)
Managed with MiraLax qod

## 2014-05-29 NOTE — Assessment & Plan Note (Signed)
Controlled, takes Coreg 12,5mg  bid, Cardizem DC 120mg  ,  Imdur 30mg ,  and lisinopril 5mg 

## 2014-05-29 NOTE — Assessment & Plan Note (Signed)
3 vessel CAD, medical therapy, not a candidate for CABG

## 2014-05-29 NOTE — Assessment & Plan Note (Signed)
Dr. Beryle Beams Oncology routinely. Currently she is taking Targretin 225mg  daily before supper.

## 2014-05-29 NOTE — Assessment & Plan Note (Signed)
Right AKA--managed with Gabapentin 100mg  nightly

## 2014-05-29 NOTE — Progress Notes (Signed)
Patient ID: Sara [name], female   DOB: 15-Jul-1919, 80 y.o.   MRN: [id number]   Code Status: DNR  Allergies  Allergen Reactions  . Evista [Raloxifene]   . Penicillins Other (See Comments)    unknown  . Triamterene Other (See Comments)    unknown  . Alendronate Sodium Rash    Pt stated oh k to use biotene    Chief Complaint  Patient presents with  . Medical Management of Chronic Issues    HPI: Patient is a 80 y.o. female seen in the SNF at [facility] today for evaluation of chronic medical conditions.   Hospitalization from 08/27/2013-08/30/2013 for CHF-Furosemide was increased to 60mg  bid and decreased Carvedilol due to bradycardia. Had proBNP 2534, d-dimer 2.97, chest x-ray with central vascular congestion and bilateral pleural effusion.  CBC showed WBC 32.8. BMP was unremarkable.   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 Beryle Beams On[facility] routinely. Currently she is taking Targretin 225mg  daily before supper.        PAF (paroxysmal atrial fibrillation) - Primary     Paroxysmal atrial fibrillation. Several episodes of RVR in 2014. Typically rate controlled when in a. Fib.     Mycosis fungoides   HTN (hypertension)     Controlled, takes Coreg 12,5mg  bid, Cardizem DC 120mg  ,  Imdur 30mg ,  and lisinopril 5mg       GERD (gastroesophageal reflux disease)     Stable, takes Famotidine 40mg  daily.        Chronic diastolic HF (heart failure) (Chronic)     OE, BNP 355.7 02/22/14. CXR 02/21/14 minimal to mild pulmonary venous hypertension w/o other overt congestive heart failure. Moist rales posterior lower lungs. Cardiology         Review of Systems:  Review of Systems  Constitutional: Negative for fever, chills, weight loss, malaise/fatigue  and diaphoresis.  HENT: Positive for hearing loss. Negative for congestion, ear discharge, ear pain and sore throat.   Eyes: Negative for pain, discharge and redness.  Respiratory: Positive for cough and shortness of breath. Negative for sputum production and wheezing.        DOE  Cardiovascular: Positive for leg swelling and PND. Negative for chest pain, palpitations, orthopnea and claudication.       Trace only LLE  Gastrointestinal: Negative for heartburn, nausea, vomiting, abdominal pain, diarrhea, constipation and blood in stool.  Genitourinary: Positive for frequency (incontinent of bladder). Negative for dysuria, urgency, hematuria and flank pain.  Musculoskeletal: Positive for joint pain. Negative for back pain, falls, myalgias and neck pain.  Skin: Positive for itching and rash.       Chronic psoriasis, very thin and red skin, frequent skin abrasion or tear. Worsened in her fingers from recent new topical ointment given by her Dermatology  Neurological: Negative for dizziness, tingling, tremors, sensory change, speech change, focal weakness, seizures, loss of consciousness, weakness (generalized) and headaches.  Endo/Heme/Allergies: Negative for environmental allergies and polydipsia. Bruises/bleeds easily.  Psychiatric/Behavioral: Positive for memory loss. Negative for depression and hallucinations. The patient is not nervous/anxious and does not have insomnia.      Past Medical History  Diagnosis Date  . Hypertension   . Atrial fibrillation   . Psoriasis   .  Congestive heart failure (CHF)   . History of CVA (cerebrovascular accident)   . Coronary artery disease   . Myocardial infarction   . CHF (congestive heart failure)   . Shortness of breath   . H/O hiatal hernia   . Closed fracture of head of left humerus 5/12  . Basal cell carcinoma   . Mycosis fungoides involving lymph nodes of axilla and upper limb 05/29/2012    Diffuse cutaneous rash; desquamation skin palms &  soles; WBC 15,000 50% lymphs; Hb 12' plattlets 244,000.  Flow cytometry 04/04/12: 91% cells CD4 positive CD 26 negative  . Hyperlipemia   . Pneumonia   . Wound of right leg 09/25/2012    Necrotic, open wound right lower leg; non healing  . Stroke   . Hyposmolality and/or hyponatremia   . Lower limb amputation, above knee 10/11/2012  . Pain in joint, multiple sites   . Mycosis fungoides, unspecified site, extranodal and solid organ sites 09/28/2012  . Occlusion and stenosis of carotid artery without mention of cerebral infarction 09/28/2012  . Chronic venous hypertension with ulcer and inflammation 09/28/2012  . Diverticulosis of colon (without mention of hemorrhage) 09/28/2012  . Synovial cyst of popliteal space 03/26/2012  . Unspecified constipation 03/24/2012  . Hypopotassemia 01/26/2012  . Debility, unspecified 01/17/2012  . Anxiety state, unspecified 12/31/2011  . Anemia, unspecified 12/21/2011  . Other specified disease of white blood cells 12/21/2011  . Peripheral vascular disease, unspecified 12/21/2011    recent ABI Lt of 0.64 down from ).80 on 12/29/11- though different labs  . Reflux esophagitis 12/21/2011  . Unspecified hereditary and idiopathic peripheral neuropathy 12/14/2011  . Pain in joint, ankle and foot 12/14/2011  . Carotid stenosis     CAROTID DOPPLER, 05/02/2012 - LEFT VERTEBRAL-occluded, LEFT BULB AND PROXIMAL ICA STENT-moderate amount of irregular mixed dense plaque 50-69% diameter reduction  . Pre-syncope     NUCLEAR STRESS TEST, 12/10/2009 - normal, EKG negative for ischemia  . TIA (transient ischemic attack)     2D ECHO, 12/05/2012 - EF 60-65%, Severely calcified annulus of the mitral valve with mild-moderate regurgitation, LA moderate-severely dilated  . Paroxysmal atrial fibrillation   . CAD in native artery     3 vessel CAD, medical therapy, not a candidate for CABG  . S/P AKA (above knee amputation) unilateral, Rt leg due to PAD and non healing wound 06/27/2013  .  HCAP (healthcare-associated pneumonia) 01/08/2012  . 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 Tonsillectomy    . Dilation and curettage of uterus    . Eye surgery    . Cataracts    . Vertebroplasty    . Amputation  10/10/2012    Procedure: AMPUTATION ABOVE KNEE;  Surgeon: Newt Minion, MD;  Location: [facility];  Service: Orthopedics;  Laterality: Right;  . Percutaneous stent intervention  04/13/2011    Left internal carotid artery stented with a 8x30 prcise nitinol self-expanding stent resulting reduction of 90% to less than 20% residual  . Cardiac catheterization  11/06/2010    CABG recommended, turned down by [facility] secondary to co morbidities   Social History:   reports that she has never smoked. She has never used smokeless tobacco. She reports that she does not drink alcohol or use illicit drugs.  Family History  Problem Relation Age of Onset  . Stomach cancer Mother   . Pneumonia Father   . Cirrhosis Brother   .  Diabetes Brother   . Heart attack Paternal Grandmother   . Heart failure Sister   . Diabetes Sister   . Heart failure Sister     Medications: Patient's Medications  New Prescriptions   No medications on file  Previous Medications   ATORVASTATIN (LIPITOR) 40 MG TABLET    Take 1 tablet (40 mg total) by mouth daily at 6 PM.   CARVEDILOL (COREG) 12.5 MG TABLET    Take 1 tablet (12.5 mg total) by mouth 2 (two) times daily with a meal.   DABIGATRAN (PRADAXA) 150 MG CAPS    Take 75 mg by mouth every 12 (twelve) hours.    DILTIAZEM (CARDIZEM CD) 120 MG 24 HR CAPSULE    Take 120 mg by mouth daily. Take 1 tablet once daily for BP.   FAMOTIDINE (PEPCID) 20 MG TABLET    Take 40 mg by mouth daily.   FOLIC ACID (FOLVITE) 1 MG TABLET    Take 1 mg by mouth daily.   FUROSEMIDE (LASIX) 40 MG TABLET    Take 1.5 tablets (60 mg total) by mouth 2 (two) times daily.   GABAPENTIN (NEURONTIN) 100 MG CAPSULE     Take 1 capsule (100 mg total) by mouth at bedtime.   GUAIFENESIN (MUCINEX) 600 MG 12 HR TABLET    Take by mouth 2 (two) times daily.   IPRATROPIUM-ALBUTEROL (DUONEB) 0.5-2.5 (3) MG/3ML SOLN    Take 3 mLs by nebulization every 6 (six) hours as needed.    ISOSORBIDE MONONITRATE (IMDUR) 30 MG 24 HR TABLET    Take 1 tablet (30 mg total) by mouth daily.   LEVOCETIRIZINE (XYZAL) 5 MG TABLET    Take 5 mg by mouth as needed for allergies.   LISINOPRIL (PRINIVIL,ZESTRIL) 5 MG TABLET    Take 5 mg by mouth daily.   NITROGLYCERIN (NITROSTAT) 0.4 MG SL TABLET    Place 0.4 mg under the tongue every 5 (five) minutes as needed for chest pain. If no relief call MD.   OMEGA-3 FATTY ACIDS (FISH OIL) 1000 MG CAPS    Take 1,000 mg by mouth at bedtime.    POLYETHYLENE GLYCOL (MIRALAX / GLYCOLAX) PACKET    Take 17 g by mouth every other day. Take 1 packet mixed in 4 oz of beverage every day to relieve constipation.   POLYVINYL ALCOHOL-POVIDONE (REFRESH OP)    Apply 1 drop to eye 4 (four) times daily. Instill 1 drop to both eyes four times daily.*Wait 3-5 minutes between eye drops*   TRIAMCINOLONE CREAM (KENALOG) 0.1 %    Apply topically as directed.   VITAMIN C (ASCORBIC ACID) 500 MG TABLET    Take 500 mg by mouth daily.  Modified Medications   Modified Medication Previous Medication   BEXAROTENE (TARGRETIN) 75 MG CAPS CAPSULE bexarotene (TARGRETIN) 75 MG CAPS capsule      Take 4 capsules (300 mg total) by mouth daily before supper. Give with food. Protect from light. CAUTION: Chemotherapy/Biotherapy-Wear gloves when handling    Take 300 mg by mouth daily before supper. Give with food. Protect from light. CAUTION: Chemotherapy/Biotherapy-Wear gloves when handling  Discontinued Medications   BEXAROTENE (TARGRETIN) 75 MG CAPS CAPSULE    Take 3 capsules (225 mg total) by mouth daily before supper. Give with food. Protect from light. CAUTION: Chemotherapy/Biotherapy-Wear gloves when handling     Physical Exam: Physical  Exam  Constitutional: She is oriented to person, place, and time. She appears well-developed and well-nourished. No distress.  HENT:  Head:  Normocephalic.  Right Ear: External ear normal.  Left Ear: External ear normal.  Nose: Nose normal.  Mouth/Throat: Oropharynx is clear and moist. No oropharyngeal exudate.  Eyes: EOM are normal. Pupils are equal, round, and reactive to light. Right conjunctiva is injected.  Only a few eyelashes remained and right conjunctiva mild irritated with bilateral lower eyelid eversion.   Neck: Normal range of motion. Neck supple. JVD present. No thyromegaly present.  Cardiovascular: Normal rate and regular rhythm.  Exam reveals decreased pulses.   Murmur heard. Pulses:      Dorsalis pedis pulses are 1+ on the left side.       Posterior tibial pulses are 1+ on the left side.  3/6  Pulmonary/Chest: Effort normal. No respiratory distress. She has no wheezes. She has rales. She exhibits no tenderness.  Decreased breath sounds posterior mid lungs. Moist rales posterior lower lungs.   Abdominal: Soft. Bowel sounds are normal. She exhibits no distension. There is no tenderness.  Genitourinary:  Incontinent of bladder  Musculoskeletal: Normal range of motion. She exhibits edema. She exhibits no tenderness.  R AKA. Left lower leg edema appears worse 1+  Lymphadenopathy:    She has no cervical adenopathy.  Neurological: She is alert and oriented to person, place, and time. She has normal reflexes. No cranial nerve deficit. She exhibits normal muscle tone. Coordination normal.  Skin: Rash noted. There is erythema.  Diffused erythematous scaly skin appearance. Psoriasis. Worsened in her fingers with superficial open areas.   Psychiatric: She has a normal mood and affect. Her behavior is normal. Judgment and thought content normal. She exhibits abnormal recent memory.    Filed Vitals:   05/29/14 1040  BP: 140/78  Pulse: 84  Temp: 97.9 F (36.6 C)  TempSrc:  Tympanic  Resp: 20      Labs reviewed: Basic Metabolic Panel:  Recent Labs  08/02/13  08/27/13 1835  08/29/13 0456 08/30/13 0453 09/11/13 1527  12/28/13 1417 12/28/13 1417  02/27/14 02/27/14 1049 05/30/14 1344  NA 140  < >  --   < > 139 134*  --   < >  --  139  < > 140 140 135  K 4.3  < >  --   < > 3.7 4.0  --   < >  --  3.6  < > 3.9 3.9 3.9  CL  --   < >  --   < > 98 94*  --   --   --   --   --   --   --  99  CO2  --   < >  --   < > 29 29  --   < >  --  28  --   --  31* 27  GLUCOSE  --   < >  --   < > 97 103*  --   < >  --  130  --   --  161* 137*  BUN 13  < >  --   < > 23 30*  --   < >  --  15.1  < > 16 15.9 18  CREATININE 0.6  < >  --   < > 0.79 0.85  --   < >  --  0.8  < > 0.9 0.9 0.81  CALCIUM  --   < >  --   < > 9.5 9.4  --   < >  --  9.6  --   --  9.7 9.3  MG  --   --  2.0  --   --   --   --   --   --   --   --   --   --   --   TSH 0.99  --   --   --   --   --  1.009  --  1.393  --   --   --   --   --   < > = values in this interval not displayed. Liver Function Tests:  Recent Labs  12/28/13 1417  02/27/14 02/27/14 1049 05/30/14 1344  AST 18  < > 16 16 20   ALT 8  < > 9 9 14   ALKPHOS 67  < > 72 72 52  BILITOT 0.29  --   --  0.32 0.3  PROT 6.9  --   --  7.2 7.0  ALBUMIN 3.4*  --   --  3.5 3.7  < > = values in this interval not displayed.  Recent Labs  07/24/13 1415 09/11/13 1529  LIPASE 20 29  AMYLASE 20 22   CBC:  Recent Labs  12/28/13 1417  02/27/14 1049  05/07/14 05/21/14 05/30/14 1308  WBC 27.1*  < > 26.5*  < > 29.0 25.7 24.6*  NEUTROABS 5.3  --  5.5  --   --   --  6.9*  HGB 12.3  < > 12.8  < > 12.1 12.3 13.2  HCT 38.3  < > 39.0  < > 37 38 40.2  MCV 94.1  --  92.0  --   --   --  91.6  PLT 263  < > 276  < > 239 238 250  < > = values in this interval not displayed. Lipid Panel:  Recent Labs  08/02/13 09/11/13 1529 12/28/13 1419  CHOL  --   --  220*  HDL  --   --  30*  LDLCALC  --   --  134*  TRIG 188* 303* 281*  CHOLHDL  --   --  7.3      Past Procedures:  Dg Chest Port 1 View  08/30/2013 CLINICAL DATA: History of CHF with worsening. EXAM: PORTABLE CHEST - 1 VIEW  IMPRESSION: The findings are consistent with the CHF with evidence of some mild improvement in the interstitial edema since the earlier study.   Dg Chest Portable 1 View  08/27/2013 CLINICAL DATA: Cough, congestion EXAM: PORTABLE CHEST - 1 VIEW IMPRESSION: Central vascular congestion without convincing pulmonary edema. Bilateral small pleural effusion with bilateral basilar atelectasis or infiltrate.  12/20/13 CXR no cardiomegaly or pulmonary vascular congestion, small bilateral pleural effusions unchanged, patchy bibasilar atelectasis or pneumonia worse on the right and unchanged on the left, old granulomatous disease re demonstrated.   02/21/14 CXR minimal to mild pulmonary venous hypertension without other overt congestive heart failure findings, emphysema/COPD, bilateral subsegmental atelectatic changes, but negative for suspected focal pneumonia.   Assessment/Plan GERD (gastroesophageal reflux disease) Stable, takes Famotidine 40mg  daily.      Phantom limb pain Right AKA--managed with Gabapentin 100mg  nightly   Unspecified constipation Managed with MiraLax qod   PAF (paroxysmal atrial fibrillation) Paroxysmal atrial fibrillation. Several episodes of RVR in 2014. Typically rate controlled when in a. Fib.   HTN (hypertension) Controlled, takes Coreg 12,5mg  bid, Cardizem DC 120mg  ,  Imdur 30mg ,  and lisinopril 5mg     Chronic diastolic HF (heart failure) OE, BNP 355.7 02/22/14. CXR 02/21/14 minimal to mild  pulmonary venous hypertension w/o other overt congestive heart failure. Moist rales posterior lower lungs. 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 Beryle [facility]ams [facility] routinely. Currently she is taking Targretin  225mg  daily before supper.        Family/ Staff Communication: observe the patient.   Goals of Care: SNF  Labs/tests ordered: none

## 2014-05-29 NOTE — Assessment & Plan Note (Signed)
Paroxysmal atrial fibrillation. Several episodes of RVR in 2014. Typically rate controlled when in a. Fib.

## 2014-05-30 ENCOUNTER — Telehealth: Payer: Self-pay | Admitting: *Deleted

## 2014-05-30 ENCOUNTER — Other Ambulatory Visit: Payer: Self-pay | Admitting: Oncology

## 2014-05-30 ENCOUNTER — Other Ambulatory Visit (HOSPITAL_BASED_OUTPATIENT_CLINIC_OR_DEPARTMENT_OTHER): Payer: Medicare Other

## 2014-05-30 ENCOUNTER — Encounter: Payer: Self-pay | Admitting: Hematology and Oncology

## 2014-05-30 ENCOUNTER — Ambulatory Visit (HOSPITAL_BASED_OUTPATIENT_CLINIC_OR_DEPARTMENT_OTHER): Payer: Medicare Other | Admitting: Hematology and Oncology

## 2014-05-30 ENCOUNTER — Telehealth: Payer: Self-pay | Admitting: Hematology and Oncology

## 2014-05-30 VITALS — BP 130/65 | HR 65 | Temp 97.8°F | Resp 18 | Ht 66.0 in

## 2014-05-30 DIAGNOSIS — C8409 Mycosis fungoides, extranodal and solid organ sites: Secondary | ICD-10-CM

## 2014-05-30 DIAGNOSIS — C44300 Unspecified malignant neoplasm of skin of unspecified part of face: Secondary | ICD-10-CM

## 2014-05-30 DIAGNOSIS — C44319 Basal cell carcinoma of skin of other parts of face: Secondary | ICD-10-CM

## 2014-05-30 DIAGNOSIS — Z23 Encounter for immunization: Secondary | ICD-10-CM

## 2014-05-30 DIAGNOSIS — I5032 Chronic diastolic (congestive) heart failure: Secondary | ICD-10-CM

## 2014-05-30 DIAGNOSIS — C8448 Peripheral T-cell lymphoma, not classified, lymph nodes of multiple sites: Secondary | ICD-10-CM

## 2014-05-30 DIAGNOSIS — C8400 Mycosis fungoides, unspecified site: Secondary | ICD-10-CM

## 2014-05-30 LAB — CBC WITH DIFFERENTIAL/PLATELET
BASO%: 0.2 % (ref 0.0–2.0)
Basophils Absolute: 0.1 10*3/uL (ref 0.0–0.1)
EOS%: 0.3 % (ref 0.0–7.0)
Eosinophils Absolute: 0.1 10*3/uL (ref 0.0–0.5)
HEMATOCRIT: 40.2 % (ref 34.8–46.6)
HGB: 13.2 g/dL (ref 11.6–15.9)
LYMPH%: 69.3 % — ABNORMAL HIGH (ref 14.0–49.7)
MCH: 30.1 pg (ref 25.1–34.0)
MCHC: 32.8 g/dL (ref 31.5–36.0)
MCV: 91.6 fL (ref 79.5–101.0)
MONO#: 0.5 10*3/uL (ref 0.1–0.9)
MONO%: 1.9 % (ref 0.0–14.0)
NEUT#: 6.9 10*3/uL — ABNORMAL HIGH (ref 1.5–6.5)
NEUT%: 28.3 % — AB (ref 38.4–76.8)
Platelets: 250 10*3/uL (ref 145–400)
RBC: 4.39 10*6/uL (ref 3.70–5.45)
RDW: 13.2 % (ref 11.2–14.5)
WBC: 24.6 10*3/uL — ABNORMAL HIGH (ref 3.9–10.3)
lymph#: 17 10*3/uL — ABNORMAL HIGH (ref 0.9–3.3)

## 2014-05-30 LAB — COMPREHENSIVE METABOLIC PANEL
ALBUMIN: 3.7 g/dL (ref 3.5–5.2)
ALT: 14 U/L (ref 0–35)
AST: 20 U/L (ref 0–37)
Alkaline Phosphatase: 52 U/L (ref 39–117)
BUN: 18 mg/dL (ref 6–23)
CO2: 27 mEq/L (ref 19–32)
CREATININE: 0.81 mg/dL (ref 0.50–1.10)
Calcium: 9.3 mg/dL (ref 8.4–10.5)
Chloride: 99 mEq/L (ref 96–112)
Glucose, Bld: 137 mg/dL — ABNORMAL HIGH (ref 70–99)
POTASSIUM: 3.9 meq/L (ref 3.5–5.3)
Sodium: 135 mEq/L (ref 135–145)
Total Bilirubin: 0.3 mg/dL (ref 0.2–1.2)
Total Protein: 7 g/dL (ref 6.0–8.3)

## 2014-05-30 LAB — TECHNOLOGIST REVIEW

## 2014-05-30 MED ORDER — INFLUENZA VAC SPLIT QUAD 0.5 ML IM SUSY
0.5000 mL | PREFILLED_SYRINGE | Freq: Once | INTRAMUSCULAR | Status: AC
  Administered 2014-05-30: 0.5 mL via INTRAMUSCULAR
  Filled 2014-05-30: qty 0.5

## 2014-05-30 MED ORDER — BEXAROTENE 75 MG PO CAPS: 300.0000 mg | ORAL_CAPSULE | Freq: Every day | ORAL | Status: DC

## 2014-05-30 NOTE — Assessment & Plan Note (Signed)
Clinically, she appears to be euvolemic. She complained of feeling thirsty. I recommended she consult with her cardiologist to see whether the dose of diuretics can be adjusted.

## 2014-05-30 NOTE — Assessment & Plan Note (Signed)
According to the patient, she will need Mohs' procedure to remove skin cancer on her face. Due to a serious cardiac condition, I recommend cardiac clearance before she proceeds.

## 2014-05-30 NOTE — Telephone Encounter (Signed)
gv and printed appt sched and avs for pt for Jan 2016 °

## 2014-05-30 NOTE — Telephone Encounter (Signed)
VM from nurse, Jenny Reichmann, at [facility] stated has question about medication order from Dr. Alvy Bimler.  Called back to [phone], ext [phone] and left VM for him to call nurse back again.

## 2014-05-30 NOTE — Telephone Encounter (Signed)
Nurse called back and Clarified Targretin 300 mg daily,  Continue same dose.

## 2014-05-30 NOTE — Progress Notes (Signed)
[place] OFFICE PROGRESS NOTE  Patient Care Team: Estill Dooms, MD as PCP - General (Internal Medicine) Devra Dopp, MD as Referring Physician (Dermatology) Renaldo Harrison, MD as Consulting Physician (Dermatology) Rexene Edison, MD as Consulting Physician (Radiation Oncology)  SUMMARY OF ONCOLOGIC HISTORY: Her original diagnosis was in July 2013. Some initial difficulty in establishing the diagnosis in view of a history of chronic psoriasis. She was referred to Dr. 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 She recommended a trial of PUVA plus interferon. The patient's medical condition was unstable at that time and after discussing the potential side effects of this regimen she elected not to go on the interferon-based treatment. A trial of Targretin was started in January 2014. She had a number of major medical problems over the last year, most dramatic of which was necessity for a right above the knee amputation for osteomyelitis.  She had a nonhealing, chronic wound, on her right ankle and was closely followed by orthopedic surgery. She had a syncopal episode and was hospitalized on 10/05/2012. Etiology of the syncope was not clear and symptoms resolved. However while hospitalized, she was reevaluated for her ankle wound by her orthopedist. She eventually had to have a right above the knee amputation for findings of osteomyelitis. She had another admission for decompensated congestive heart failure in February. She likely suffered a stroke at that time. This was felt to be secondary to known atrial fibrillation. She was already on aspirin. Plavix initially added but neurologist recommended stopping the Plavix and putting her on Pradaxa. Full dose Pradaxa 150 mg twice daily started around 12/05/2012. This essentially day derailed any consideration for PUVA treatments from a practical point of view. She had to  move to a skilled care facility. The Targretin was initially started a dose of 75 mg daily. Dose was escalated at the end of March, 2014, to 150 mg. She has tolerated the drug well. She has  adenopathy  limited to her axillae. By 03/20/2013 visit I felt that she was showing a moderate response to the drug. There was a significant decrease in erythema and desquamation. At time of her visit on August 26, multiple new papules noted on the skin of her trunk.  Due to her age and now handicapped status, she did not want to go to [facility] for an evaluation for electron beam radiation therapy. She was therefore evaluated here in [facility] by Dr. Arloa Koh. He didn't think that the chest papules were significant enough to initiate a course of radiation. At time of her visit here on December 30, she had just been discharged from the hospital with an episode of heart failure. In April 2015, the patient was admitted to the hospital for pneumonia. At the end of May 2015, the dose of chemotherapy was increased to 225 mg daily. On 04/01/2014, the dose of Targretin was increased to 300 mg daily. INTERVAL HISTORY: Please see below for problem oriented charting. She feels well. The skin on her face is improving. She continues to complain of desquamation of the skin on her hands. She complained of feeling thirsty. She denies recent infection. Denies recent bleeding. She was told by her dermatologist that she may need most procedure to remove skin cancer off her face REVIEW OF SYSTEMS:   Constitutional: Denies fevers, chills or abnormal weight loss Eyes: Denies blurriness of vision Ears, nose, mouth, throat, and face: Denies mucositis or sore throat  Respiratory: Denies cough, dyspnea or wheezes Cardiovascular: Denies palpitation, chest discomfort or lower extremity swelling Gastrointestinal:  Denies nausea, heartburn or change in bowel habits Lymphatics: Denies new lymphadenopathy or easy  bruising Neurological:Denies numbness, tingling or new weaknesses Behavioral/Psych: Mood is stable, no new changes  All other systems were reviewed with the patient and are negative.  I have reviewed the past medical history, past surgical history, social history and family history with the patient and they are unchanged from previous note.  ALLERGIES:  is allergic to evista; penicillins; triamterene; and alendronate sodium.  MEDICATIONS:  Current Outpatient Prescriptions  Medication Sig Dispense Refill  . atorvastatin (LIPITOR) 40 MG tablet Take 1 tablet (40 mg total) by mouth daily at 6 PM.      . bexarotene (TARGRETIN) 75 MG CAPS capsule Take 4 capsules (300 mg total) by mouth daily before supper. Give with food. Protect from light. CAUTION: Chemotherapy/Biotherapy-Wear gloves when handling  120 capsule  6  . carvedilol (COREG) 12.5 MG tablet Take 1 tablet (12.5 mg total) by mouth 2 (two) times daily with a meal.  60 tablet  0  . dabigatran (PRADAXA) 150 MG CAPS Take 75 mg by mouth every 12 (twelve) hours.       Marland Kitchen diltiazem (CARDIZEM CD) 120 MG 24 hr capsule Take 120 mg by mouth daily. Take 1 tablet once daily for BP.      . famotidine (PEPCID) 20 MG tablet Take 40 mg by mouth daily.      . folic acid (FOLVITE) 1 MG tablet Take 1 mg by mouth daily.      . furosemide (LASIX) 40 MG tablet Take 1.5 tablets (60 mg total) by mouth 2 (two) times daily.  30 tablet  0  . gabapentin (NEURONTIN) 100 MG capsule Take 1 capsule (100 mg total) by mouth at bedtime.      Marland Kitchen guaiFENesin (MUCINEX) 600 MG 12 hr tablet Take by mouth 2 (two) times daily.      Marland Kitchen ipratropium-albuterol (DUONEB) 0.5-2.5 (3) MG/3ML SOLN Take 3 mLs by nebulization every 6 (six) hours as needed.       . isosorbide mononitrate (IMDUR) 30 MG 24 hr tablet Take 1 tablet (30 mg total) by mouth daily.  30 tablet  6  . levocetirizine (XYZAL) 5 MG tablet Take 5 mg by mouth as needed for allergies.      Marland Kitchen lisinopril (PRINIVIL,ZESTRIL) 5 MG  tablet Take 5 mg by mouth daily.      . nitroGLYCERIN (NITROSTAT) 0.4 MG SL tablet Place 0.4 mg under the tongue every 5 (five) minutes as needed for chest pain. If no relief call MD.      . Omega-3 Fatty Acids (FISH OIL) 1000 MG CAPS Take 1,000 mg by mouth at bedtime.       . polyethylene glycol (MIRALAX / GLYCOLAX) packet Take 17 g by mouth every other day. Take 1 packet mixed in 4 oz of beverage every day to relieve constipation.      . Polyvinyl Alcohol-Povidone (REFRESH OP) Apply 1 drop to eye 4 (four) times daily. Instill 1 drop to both eyes four times daily.*Wait 3-5 minutes between eye drops*      . triamcinolone cream (KENALOG) 0.1 % Apply topically as directed.      . vitamin C (ASCORBIC ACID) 500 MG tablet Take 500 mg by mouth daily.       No current facility-administered medications for this visit.    PHYSICAL EXAMINATION: ECOG PERFORMANCE STATUS: 1 -  Symptomatic but completely ambulatory  Filed Vitals:   05/30/14 1327  BP: 130/65  Pulse: 65  Temp: 97.8 F (36.6 C)  Resp: 18   Filed Weights    GENERAL:alert, no distress and comfortable SKIN: Diffuse skin redness throughout her body, worse in her hands. There is desquamation of dry skin off her palms. Overall, the appearance is improving. She complained of a small mole on your face that required further surgery. EYES: normal, Conjunctiva are pink and non-injected, sclera clear OROPHARYNX:no exudate, no erythema and lips, buccal mucosa, and tongue normal  NECK: supple, thyroid normal size, non-tender, without nodularity LYMPH:  no palpable lymphadenopathy in the cervical, axillary or inguinal LUNGS: clear to auscultation and percussion with normal breathing effort HEART: regular rate & rhythm and no murmurs and no lower extremity edema ABDOMEN:abdomen soft, non-tender and normal bowel sounds Musculoskeletal:no cyanosis of digits and no clubbing  NEURO: alert & oriented x 3 with fluent speech, no focal motor/sensory  deficits  LABORATORY DATA:  I have reviewed the data as listed    Component Value Date/Time   NA 135 05/30/2014 1344   NA 140 02/27/2014 1049   NA 140 02/27/2014   K 3.9 05/30/2014 1344   K 3.9 02/27/2014 1049   CL 99 05/30/2014 1344   CL 102 09/25/2012 1002   CO2 27 05/30/2014 1344   CO2 31* 02/27/2014 1049   GLUCOSE 137* 05/30/2014 1344   GLUCOSE 161* 02/27/2014 1049   GLUCOSE 145* 09/25/2012 1002   BUN 18 05/30/2014 1344   BUN 15.9 02/27/2014 1049   BUN 16 02/27/2014   CREATININE 0.81 05/30/2014 1344   CREATININE 0.9 02/27/2014 1049   CREATININE 0.9 02/27/2014   CREATININE 0.56 01/23/2013 0720   CALCIUM 9.3 05/30/2014 1344   CALCIUM 9.7 02/27/2014 1049   PROT 7.0 05/30/2014 1344   PROT 7.2 02/27/2014 1049   ALBUMIN 3.7 05/30/2014 1344   ALBUMIN 3.5 02/27/2014 1049   AST 20 05/30/2014 1344   AST 16 02/27/2014 1049   ALT 14 05/30/2014 1344   ALT 9 02/27/2014 1049   ALKPHOS 52 05/30/2014 1344   ALKPHOS 72 02/27/2014 1049   BILITOT 0.3 05/30/2014 1344   BILITOT 0.32 02/27/2014 1049   GFRNONAA 58* 08/30/2013 0453   GFRAA 67* 08/30/2013 0453    No results found for this basename: SPEP,  UPEP,   kappa and lambda light chains    Lab Results  Component Value Date   WBC 24.6* 05/30/2014   NEUTROABS 6.9* 05/30/2014   HGB 13.2 05/30/2014   HCT 40.2 05/30/2014   MCV 91.6 05/30/2014   PLT 250 05/30/2014      Chemistry      Component Value Date/Time   NA 135 05/30/2014 1344   NA 140 02/27/2014 1049   NA 140 02/27/2014   K 3.9 05/30/2014 1344   K 3.9 02/27/2014 1049   CL 99 05/30/2014 1344   CL 102 09/25/2012 1002   CO2 27 05/30/2014 1344   CO2 31* 02/27/2014 1049   BUN 18 05/30/2014 1344   BUN 15.9 02/27/2014 1049   BUN 16 02/27/2014   CREATININE 0.81 05/30/2014 1344   CREATININE 0.9 02/27/2014 1049   CREATININE 0.9 02/27/2014   CREATININE 0.56 01/23/2013 0720   GLU 161 02/27/2014      Component Value Date/Time   CALCIUM 9.3 05/30/2014 1344   CALCIUM 9.7 02/27/2014 1049   ALKPHOS 52 05/30/2014 1344    ALKPHOS 72 02/27/2014 1049   AST 20 05/30/2014  1344   AST 16 02/27/2014 1049   ALT 14 05/30/2014 1344   ALT 9 02/27/2014 1049   BILITOT 0.3 05/30/2014 1344   BILITOT 0.32 02/27/2014 1049      ASSESSMENT & PLAN:  Mycosis fungoides She appears to be doing better with increased dose of Targretin recently. Her blood counts are stable. Clinically, skin rash and desquamation is slightly better. I recommend continue the same dose for now. The patient will continue blood counts at the nursing home every other month and I will see her back in 4 months.  Skin cancer of face According to the patient, she will need Mohs' procedure to remove skin cancer on her face. Due to a serious cardiac condition, I recommend cardiac clearance before she proceeds.  Chronic diastolic HF (heart failure) Clinically, she appears to be euvolemic. She complained of feeling thirsty. I recommended she consult with her cardiologist to see whether the dose of diuretics can be adjusted.    Orders Placed This Encounter  Procedures  . Comprehensive metabolic panel  . CBC with Differential    Standing Status: Future     Number of Occurrences:      Standing Expiration Date: 07/04/2015  . Comprehensive metabolic panel    Standing Status: Future     Number of Occurrences:      Standing Expiration Date: 07/04/2015  . Lactate dehydrogenase    Standing Status: Future     Number of Occurrences:      Standing Expiration Date: 07/04/2015   All questions were answered. The patient knows to call the clinic with any problems, questions or concerns. No barriers to learning was detected. I spent 30 minutes counseling the patient face to face. The total time spent in the appointment was 40 minutes and more than 50% was on counseling and review of test results     [facility], [place], MD 05/30/2014 8:24 PM

## 2014-05-30 NOTE — Assessment & Plan Note (Signed)
She appears to be doing better with increased dose of Targretin recently. Her blood counts are stable. Clinically, skin rash and desquamation is slightly better. I recommend continue the same dose for now. The patient will continue blood counts at the nursing home every other month and I will see her back in 4 months.

## 2014-06-03 ENCOUNTER — Other Ambulatory Visit: Payer: Self-pay | Admitting: Oncology

## 2014-06-04 ENCOUNTER — Other Ambulatory Visit: Payer: Self-pay | Admitting: *Deleted

## 2014-06-04 ENCOUNTER — Encounter: Payer: Self-pay | Admitting: *Deleted

## 2014-06-04 ENCOUNTER — Other Ambulatory Visit: Payer: Self-pay | Admitting: Hematology and Oncology

## 2014-06-04 DIAGNOSIS — C8448 Peripheral T-cell lymphoma, not classified, lymph nodes of multiple sites: Secondary | ICD-10-CM

## 2014-06-04 LAB — CBC AND DIFFERENTIAL
HCT: 39 % (ref 36–46)
Hemoglobin: 12.3 g/dL (ref 12.0–16.0)
PLATELETS: 211 10*3/uL (ref 150–399)
WBC: 28.8 10*3/mL

## 2014-06-04 MED ORDER — BEXAROTENE 75 MG PO CAPS: 300.0000 mg | ORAL_CAPSULE | Freq: Every day | ORAL | Status: DC

## 2014-06-05 ENCOUNTER — Other Ambulatory Visit: Payer: Self-pay | Admitting: Nurse Practitioner

## 2014-06-19 ENCOUNTER — Encounter: Payer: Self-pay | Admitting: Obstetrics & Gynecology

## 2014-07-03 ENCOUNTER — Other Ambulatory Visit: Payer: Self-pay | Admitting: Nurse Practitioner

## 2014-07-03 LAB — CBC AND DIFFERENTIAL
HEMATOCRIT: 39 % (ref 36–46)
HEMOGLOBIN: 12 g/dL (ref 12.0–16.0)
PLATELETS: 226 10*3/uL (ref 150–399)
WBC: 27.3 10^3/mL

## 2014-07-11 ENCOUNTER — Non-Acute Institutional Stay (SKILLED_NURSING_FACILITY): Payer: Medicare Other | Admitting: Nurse Practitioner

## 2014-07-11 ENCOUNTER — Encounter: Payer: Self-pay | Admitting: Nurse Practitioner

## 2014-07-11 DIAGNOSIS — C8448 Peripheral T-cell lymphoma, not classified, lymph nodes of multiple sites: Secondary | ICD-10-CM

## 2014-07-11 DIAGNOSIS — K219 Gastro-esophageal reflux disease without esophagitis: Secondary | ICD-10-CM

## 2014-07-11 DIAGNOSIS — T148 Other injury of unspecified body region: Secondary | ICD-10-CM

## 2014-07-11 DIAGNOSIS — I5032 Chronic diastolic (congestive) heart failure: Secondary | ICD-10-CM

## 2014-07-11 DIAGNOSIS — G546 Phantom limb syndrome with pain: Secondary | ICD-10-CM

## 2014-07-11 DIAGNOSIS — T148XXA Other injury of unspecified body region, initial encounter: Secondary | ICD-10-CM

## 2014-07-11 DIAGNOSIS — I480 Paroxysmal atrial fibrillation: Secondary | ICD-10-CM

## 2014-07-11 DIAGNOSIS — I2583 Coronary atherosclerosis due to lipid rich plaque: Secondary | ICD-10-CM

## 2014-07-11 DIAGNOSIS — C8400 Mycosis fungoides, unspecified site: Secondary | ICD-10-CM

## 2014-07-11 DIAGNOSIS — I10 Essential (primary) hypertension: Secondary | ICD-10-CM

## 2014-07-11 DIAGNOSIS — I2510 Atherosclerotic heart disease of native coronary artery without angina pectoris: Secondary | ICD-10-CM

## 2014-07-11 DIAGNOSIS — K5900 Constipation, unspecified: Secondary | ICD-10-CM

## 2014-07-11 NOTE — Assessment & Plan Note (Signed)
Paroxysmal atrial fibrillation. Several episodes of RVR in 2014. Typically rate controlled when in a. Fib.

## 2014-07-11 NOTE — Assessment & Plan Note (Signed)
Managed with MiraLax qod

## 2014-07-11 NOTE — Assessment & Plan Note (Signed)
Controlled, takes Coreg 12,5mg  bid, Cardizem DC 120mg  ,  Imdur 30mg ,  and lisinopril 5mg 

## 2014-07-11 NOTE — Progress Notes (Signed)
Patient ID: Sara [name], female   DOB: 08/14/1919, 80 y.o.   MRN: [id number]   Code Status: DNR  Allergies  Allergen Reactions  . Evista [Raloxifene]   . Penicillins Other (See Comments)    unknown  . Triamterene Other (See Comments)    unknown  . Alendronate Sodium Rash    Pt stated oh k to use biotene    Chief Complaint  Patient presents with  . Medical Management of Chronic Issues    HPI: Patient is a 80 y.o. female seen in the SNF at [facility] today for evaluation of chronic medical conditions.   Hospitalization from 08/27/2013-08/30/2013 for CHF-Furosemide was increased to 60mg  bid and decreased Carvedilol due to bradycardia. Had proBNP 2534, d-dimer 2.97, chest x-ray with central vascular congestion and bilateral pleural effusion.  CBC showed WBC 32.8. BMP was unremarkable.   Problem List Items Addressed This Visit    Skin abrasion    A new hematoma/abrasion left upper LE below the left knee-not infected. Will apply Bactroban ointment daily and cover the area with non adhesive dressing daily until healed.     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 Beryle Beams Oncology routinely. Currently she is taking Targretin 225mg  daily before supper.       PAF (paroxysmal atrial fibrillation) - Primary    Paroxysmal atrial fibrillation. Several episodes of RVR in 2014. Typically rate controlled when in a. Fib.    Mycosis fungoides    Dr. Beryle Beams Oncology routinely. Currently she is taking Targretin 225mg  daily before supper.      HTN (hypertension)    Controlled, takes Coreg 12,5mg  bid, Cardizem DC 120mg  ,  Imdur 30mg ,  and lisinopril 5mg       GERD (gastroesophageal reflux disease)    Stable, takes Famotidine 40mg  daily.       Constipation    Managed with MiraLax qod     Chronic diastolic HF (heart failure) (Chronic)    OE, BNP 355.7  02/22/14. CXR 02/21/14 minimal to mild pulmonary venous hypertension w/o other overt congestive heart failure. Moist rales posterior lower lungs. Cardiology 05/30/14     CAD- 3V March 2012- medical Rx (Chronic)    3 vessel CAD, medical therapy, not a candidate for CABG         Review of Systems:  Review of Systems  Constitutional: Negative for fever, chills, weight loss, malaise/fatigue and diaphoresis.  HENT: Positive for hearing loss. Negative for congestion, ear discharge, ear pain and sore throat.   Eyes: Negative for pain, discharge and redness.  Respiratory: Positive for cough and shortness of breath. Negative for sputum production and wheezing.        DOE  Cardiovascular: Positive for leg swelling and PND. Negative for chest pain, palpitations, orthopnea and claudication.       Trace only LLE  Gastrointestinal: Negative for heartburn, nausea, vomiting, abdominal pain, diarrhea, constipation and blood in stool.  Genitourinary: Positive for frequency (incontinent of bladder). Negative for dysuria, urgency, hematuria and flank pain.  Musculoskeletal: Positive for joint pain. Negative for back pain, falls, myalgias and neck pain.  Skin: Positive for itching and rash.       Chronic psoriasis, very thin and red skin, frequent skin abrasion or tear. Worsened in her fingers from recent new topical ointment given by her Dermatology. A new hematoma/abrasion left upper LE  below the left knee-not infected.   Neurological: Negative for dizziness, tingling, tremors, sensory change, speech change, focal weakness, seizures, loss of consciousness, weakness (generalized) and headaches.  Endo/Heme/Allergies: Negative for environmental allergies and polydipsia. Bruises/bleeds easily.  Psychiatric/Behavioral: Positive for memory loss. Negative for depression and hallucinations. The patient is not nervous/anxious and does not have insomnia.      Past Medical History  Diagnosis Date  . Hypertension     . Atrial fibrillation   . Psoriasis   . Congestive heart failure (CHF)   . History of CVA (cerebrovascular accident)   . Coronary artery disease   . Myocardial infarction   . CHF (congestive heart failure)   . Shortness of breath   . H/O hiatal hernia   . Closed fracture of head of left humerus 5/12  . Basal cell carcinoma   . 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  Flow cytometry 04/04/12: 91% cells CD4 positive CD 26 negative  . Hyperlipemia   . Pneumonia   . Wound of right leg 09/25/2012    Necrotic, open wound right lower leg; non healing  . Stroke   . Hyposmolality and/or hyponatremia   . Lower limb amputation, above knee 10/11/2012  . Pain in joint, multiple sites   . Mycosis fungoides, unspecified site, extranodal and solid organ sites 09/28/2012  . Occlusion and stenosis of carotid artery without mention of cerebral infarction 09/28/2012  . Chronic venous hypertension with ulcer and inflammation 09/28/2012  . Diverticulosis of colon (without mention of hemorrhage) 09/28/2012  . Synovial cyst of popliteal space 03/26/2012  . Unspecified constipation 03/24/2012  . Hypopotassemia 01/26/2012  . Debility, unspecified 01/17/2012  . Anxiety state, unspecified 12/31/2011  . Anemia, unspecified 12/21/2011  . Other specified disease of white blood cells 12/21/2011  . Peripheral vascular disease, unspecified 12/21/2011    recent ABI Lt of 0.64 down from ).80 on 12/29/11- though different labs  . Reflux esophagitis 12/21/2011  . Unspecified hereditary and idiopathic peripheral neuropathy 12/14/2011  . Pain in joint, ankle and foot 12/14/2011  . Carotid stenosis     CAROTID DOPPLER, 05/02/2012 - LEFT VERTEBRAL-occluded, LEFT BULB AND PROXIMAL ICA STENT-moderate amount of irregular mixed dense plaque 50-69% diameter reduction  . Pre-syncope      NUCLEAR STRESS TEST, 12/10/2009 - normal, EKG negative for ischemia  . TIA (transient ischemic attack)     2D ECHO, 12/05/2012 - EF 60-65%, Severely calcified annulus of the mitral valve with mild-moderate regurgitation, LA moderate-severely dilated  . Paroxysmal atrial fibrillation   . CAD in native artery     3 vessel CAD, medical therapy, not a candidate for CABG  . S/P AKA (above knee amputation) unilateral, Rt leg due to PAD and non healing wound 06/27/2013  . HCAP (healthcare-associated pneumonia) 01/08/2012  . 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 Tonsillectomy    . Dilation and curettage of uterus    . Eye surgery    . Cataracts    . Vertebroplasty    . Amputation  10/10/2012    Procedure: AMPUTATION ABOVE KNEE;  Surgeon: Newt Minion, MD;  Location: [facility];  Service: Orthopedics;  Laterality: Right;  . Percutaneous stent intervention  04/13/2011    Left internal carotid artery stented with a 8x30 prcise  nitinol self-expanding stent resulting reduction of 90% to less than 20% residual  . Cardiac catheterization  11/06/2010    CABG recommended, turned down by [facility] secondary to co morbidities   Social History:   reports that she has never smoked. She has never used smokeless tobacco. She reports that she does not drink alcohol or use illicit drugs.  Family History  Problem Relation Age of Onset  . Stomach cancer Mother   . Pneumonia Father   . Cirrhosis Brother   . Diabetes Brother   . Heart attack Paternal Grandmother   . Heart failure Sister   . Diabetes Sister   . Heart failure Sister     Medications: Patient's Medications  New Prescriptions   No medications on file  Previous Medications   ATORVASTATIN (LIPITOR) 40 MG TABLET    Take 1 tablet (40 mg total) by mouth daily at 6 PM.   BEXAROTENE (TARGRETIN) 75 MG CAPS CAPSULE    Take 4 capsules (300 mg total) by mouth daily before supper.  Give with food. Protect from light. CAUTION: Chemotherapy/Biotherapy-Wear gloves when handling   CARVEDILOL (COREG) 12.5 MG TABLET    Take 1 tablet (12.5 mg total) by mouth 2 (two) times daily with a meal.   DABIGATRAN (PRADAXA) 150 MG CAPS    Take 75 mg by mouth every 12 (twelve) hours.    DILTIAZEM (CARDIZEM CD) 120 MG 24 HR CAPSULE    Take 120 mg by mouth daily. Take 1 tablet once daily for BP.   FAMOTIDINE (PEPCID) 20 MG TABLET    Take 40 mg by mouth daily.   FOLIC ACID (FOLVITE) 1 MG TABLET    Take 1 mg by mouth daily.   FUROSEMIDE (LASIX) 40 MG TABLET    Take 1.5 tablets (60 mg total) by mouth 2 (two) times daily.   GABAPENTIN (NEURONTIN) 100 MG CAPSULE    Take 1 capsule (100 mg total) by mouth at bedtime.   GUAIFENESIN (MUCINEX) 600 MG 12 HR TABLET    Take by mouth 2 (two) times daily.   IPRATROPIUM-ALBUTEROL (DUONEB) 0.5-2.5 (3) MG/3ML SOLN    Take 3 mLs by nebulization every 6 (six) hours as needed.    ISOSORBIDE MONONITRATE (IMDUR) 30 MG 24 HR TABLET    Take 1 tablet (30 mg total) by mouth daily.   LEVOCETIRIZINE (XYZAL) 5 MG TABLET    Take 5 mg by mouth as needed for allergies.   LISINOPRIL (PRINIVIL,ZESTRIL) 5 MG TABLET    Take 5 mg by mouth daily.   NITROGLYCERIN (NITROSTAT) 0.4 MG SL TABLET    Place 0.4 mg under the tongue every 5 (five) minutes as needed for chest pain. If no relief call MD.   OMEGA-3 FATTY ACIDS (FISH OIL) 1000 MG CAPS    Take 1,000 mg by mouth at bedtime.    POLYETHYLENE GLYCOL (MIRALAX / GLYCOLAX) PACKET    Take 17 g by mouth every other day. Take 1 packet mixed in 4 oz of beverage every day to relieve constipation.   POLYVINYL ALCOHOL-POVIDONE (REFRESH OP)    Apply 1 drop to eye 4 (four) times daily. Instill 1 drop to both eyes four times daily.*Wait 3-5 minutes between eye drops*   TRIAMCINOLONE CREAM (KENALOG) 0.1 %    Apply topically as directed.   VITAMIN C (ASCORBIC ACID) 500 MG TABLET    Take 500 mg by mouth daily.  Modified Medications   No medications  on file  Discontinued Medications   No medications  on file     Physical Exam: Physical Exam  Constitutional: She is oriented to person, place, and time. She appears well-developed and well-nourished. No distress.  HENT:  Head: Normocephalic.  Right Ear: External ear normal.  Left Ear: External ear normal.  Nose: Nose normal.  Mouth/Throat: Oropharynx is clear and moist. No oropharyngeal exudate.  Eyes: EOM are normal. Pupils are equal, round, and reactive to light. Right conjunctiva is injected.  Only a few eyelashes remained and right conjunctiva mild irritated with bilateral lower eyelid eversion.   Neck: Normal range of motion. Neck supple. JVD present. No thyromegaly present.  Cardiovascular: Normal rate and regular rhythm.  Exam reveals decreased pulses.   Murmur heard. Pulses:      Dorsalis pedis pulses are 1+ on the left side.       Posterior tibial pulses are 1+ on the left side.  3/6  Pulmonary/Chest: Effort normal. No respiratory distress. She has no wheezes. She has rales. She exhibits no tenderness.  Decreased breath sounds posterior mid lungs. Moist rales posterior lower lungs.   Abdominal: Soft. Bowel sounds are normal. She exhibits no distension. There is no tenderness.  Genitourinary:  Incontinent of bladder  Musculoskeletal: Normal range of motion. She exhibits edema. She exhibits no tenderness.  R AKA. Left lower leg edema appears worse 1+  Lymphadenopathy:    She has no cervical adenopathy.  Neurological: She is alert and oriented to person, place, and time. She has normal reflexes. No cranial nerve deficit. She exhibits normal muscle tone. Coordination normal.  Skin: Rash noted. There is erythema.  Diffused erythematous scaly skin appearance. Psoriasis. Worsened in her fingers with superficial open areas. A new hematoma/abrasion left upper LE below the left knee-not infected.    Psychiatric: She has a normal mood and affect. Her behavior is normal. Judgment  and thought content normal. She exhibits abnormal recent memory.    Filed Vitals:   07/11/14 1618  BP: 120/60  Pulse: 66  Temp: 97.7 F (36.5 C)  TempSrc: Tympanic  Resp: 16      Labs reviewed: Basic Metabolic Panel:  Recent Labs  08/02/13  08/27/13 1835  08/29/13 0456 08/30/13 0453 09/11/13 1527  12/28/13 1417 12/28/13 1417  02/27/14 02/27/14 1049 05/30/14 1344  NA 140  < >  --   < > 139 134*  --   < >  --  139  < > 140 140 135  K 4.3  < >  --   < > 3.7 4.0  --   < >  --  3.6  < > 3.9 3.9 3.9  CL  --   < >  --   < > 98 94*  --   --   --   --   --   --   --  99  CO2  --   < >  --   < > 29 29  --   < >  --  28  --   --  31* 27  GLUCOSE  --   < >  --   < > 97 103*  --   < >  --  130  --   --  161* 137*  BUN 13  < >  --   < > 23 30*  --   < >  --  15.1  < > 16 15.9 18  CREATININE 0.6  < >  --   < > 0.79 0.85  --   < >  --  0.8  < > 0.9 0.9 0.81  CALCIUM  --   < >  --   < > 9.5 9.4  --   < >  --  9.6  --   --  9.7 9.3  MG  --   --  2.0  --   --   --   --   --   --   --   --   --   --   --   TSH 0.99  --   --   --   --   --  1.009  --  1.393  --   --   --   --   --   < > = values in this interval not displayed. Liver Function Tests:  Recent Labs  12/28/13 1417  02/27/14 02/27/14 1049 05/30/14 1344  AST 18  < > 16 16 20   ALT 8  < > 9 9 14   ALKPHOS 67  < > 72 72 52  BILITOT 0.29  --   --  0.32 0.3  PROT 6.9  --   --  7.2 7.0  ALBUMIN 3.4*  --   --  3.5 3.7  < > = values in this interval not displayed.  Recent Labs  07/24/13 1415 09/11/13 1529  LIPASE 20 29  AMYLASE 20 22   CBC:  Recent Labs  12/28/13 1417  02/27/14 1049  05/30/14 1308 06/04/14 07/03/14  WBC 27.1*  < > 26.5*  < > 24.6* 28.8 27.3  NEUTROABS 5.3  --  5.5  --  6.9*  --   --   HGB 12.3  < > 12.8  < > 13.2 12.3 12.0  HCT 38.3  < > 39.0  < > 40.2 39 39  MCV 94.1  --  92.0  --  91.6  --   --   PLT 263  < > 276  < > 250 211 226  < > = values in this interval not displayed. Lipid  Panel:  Recent Labs  08/02/13 09/11/13 1529 12/28/13 1419  CHOL  --   --  220*  HDL  --   --  30*  LDLCALC  --   --  134*  TRIG 188* 303* 281*  CHOLHDL  --   --  7.3    Past Procedures:  Dg Chest Port 1 View  08/30/2013 CLINICAL DATA: History of CHF with worsening. EXAM: PORTABLE CHEST - 1 VIEW  IMPRESSION: The findings are consistent with the CHF with evidence of some mild improvement in the interstitial edema since the earlier study.   Dg Chest Portable 1 View  08/27/2013 CLINICAL DATA: Cough, congestion EXAM: PORTABLE CHEST - 1 VIEW IMPRESSION: Central vascular congestion without convincing pulmonary edema. Bilateral small pleural effusion with bilateral basilar atelectasis or infiltrate.  12/20/13 CXR no cardiomegaly or pulmonary vascular congestion, small bilateral pleural effusions unchanged, patchy bibasilar atelectasis or pneumonia worse on the right and unchanged on the left, old granulomatous disease re demonstrated.   02/21/14 CXR minimal to mild pulmonary venous hypertension without other overt congestive heart failure findings, emphysema/COPD, bilateral subsegmental atelectatic changes, but negative for suspected focal pneumonia.   Assessment/Plan Constipation Managed with MiraLax qod   Chronic diastolic HF (heart failure) OE, BNP 355.7 02/22/14. CXR 02/21/14 minimal to mild pulmonary venous hypertension w/o other overt congestive heart failure. Moist rales posterior lower lungs. Cardiology 05/30/14   CAD- 3V March 2012- medical Rx 3 vessel CAD, medical therapy, not a  candidate for CABG    GERD (gastroesophageal reflux disease) Stable, takes Famotidine 40mg  daily.     HTN (hypertension) Controlled, takes Coreg 12,5mg  bid, Cardizem DC 120mg  ,  Imdur 30mg ,  and lisinopril 5mg     Mycosis fungoides Dr. Beryle Beams Oncology routinely. Currently she is taking Targretin 225mg  daily before supper.    PAF (paroxysmal atrial fibrillation) Paroxysmal atrial  fibrillation. Several episodes of RVR in 2014. Typically rate controlled when in a. Fib.  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 Beryle Beams Oncology routinely. Currently she is taking Targretin 225mg  daily before supper.     Phantom limb pain Right AKA--managed with Gabapentin 100mg  nightly    Skin abrasion A new hematoma/abrasion left upper LE below the left knee-not infected. Will apply Bactroban ointment daily and cover the area with non adhesive dressing daily until healed.     Family/ Staff Communication: observe the patient.   Goals of Care: SNF  Labs/tests ordered: none

## 2014-07-11 NOTE — Assessment & Plan Note (Signed)
Stable, takes Famotidine 40mg  daily.

## 2014-07-11 NOTE — Assessment & Plan Note (Signed)
Dr. Beryle Beams Oncology routinely. Currently she is taking Targretin 225mg  daily before supper.

## 2014-07-11 NOTE — Assessment & Plan Note (Signed)
OE, BNP 355.7 02/22/14. CXR 02/21/14 minimal to mild pulmonary venous hypertension w/o other overt congestive heart failure. Moist rales posterior lower lungs. Cardiology 05/30/14

## 2014-07-11 NOTE — Assessment & Plan Note (Signed)
3 vessel CAD, medical therapy, not a candidate for CABG

## 2014-07-11 NOTE — Assessment & Plan Note (Signed)
Right AKA--managed with Gabapentin 100mg  nightly

## 2014-07-15 DIAGNOSIS — T148XXA Other injury of unspecified body region, initial encounter: Secondary | ICD-10-CM | POA: Insufficient documentation

## 2014-07-15 NOTE — Assessment & Plan Note (Signed)
A new hematoma/abrasion left upper LE below the left knee-not infected. Will apply Bactroban ointment daily and cover the area with non adhesive dressing daily until healed.

## 2014-07-31 ENCOUNTER — Other Ambulatory Visit: Payer: Self-pay | Admitting: Nurse Practitioner

## 2014-07-31 LAB — BASIC METABOLIC PANEL
BUN: 18 mg/dL (ref 4–21)
Creatinine: 0.8 mg/dL (ref 0.5–1.1)
Glucose: 94 mg/dL
POTASSIUM: 4.1 mmol/L (ref 3.4–5.3)
SODIUM: 137 mmol/L (ref 137–147)

## 2014-07-31 LAB — LIPID PANEL: Triglycerides: 264 mg/dL — AB (ref 40–160)

## 2014-07-31 LAB — CBC AND DIFFERENTIAL
HEMATOCRIT: 36 % (ref 36–46)
Hemoglobin: 11.7 g/dL — AB (ref 12.0–16.0)
Platelets: 318 10*3/uL (ref 150–399)
WBC: 21.9 10^3/mL

## 2014-07-31 LAB — HEPATIC FUNCTION PANEL
ALT: 8 U/L (ref 7–35)
AST: 16 U/L (ref 13–35)
Alkaline Phosphatase: 51 U/L (ref 25–125)
Bilirubin, Total: 0.4 mg/dL

## 2014-07-31 LAB — TSH: TSH: 0.74 u[IU]/mL (ref 0.41–5.90)

## 2014-08-02 ENCOUNTER — Encounter (HOSPITAL_COMMUNITY): Payer: Self-pay | Admitting: *Deleted

## 2014-08-02 ENCOUNTER — Emergency Department (HOSPITAL_COMMUNITY): Payer: Medicare Other

## 2014-08-02 ENCOUNTER — Emergency Department (HOSPITAL_COMMUNITY)
Admission: EM | Admit: 2014-08-02 | Discharge: 2014-08-03 | Disposition: A | Discharge status: Home / Self Care | Payer: Medicare Other | Attending: Emergency Medicine | Admitting: Emergency Medicine

## 2014-08-02 DIAGNOSIS — Z89619 Acquired absence of unspecified leg above knee: Secondary | ICD-10-CM | POA: Diagnosis not present

## 2014-08-02 DIAGNOSIS — Z9889 Other specified postprocedural states: Secondary | ICD-10-CM | POA: Insufficient documentation

## 2014-08-02 DIAGNOSIS — I509 Heart failure, unspecified: Secondary | ICD-10-CM | POA: Insufficient documentation

## 2014-08-02 DIAGNOSIS — C8448 Peripheral T-cell lymphoma, not classified, lymph nodes of multiple sites: Secondary | ICD-10-CM | POA: Insufficient documentation

## 2014-08-02 DIAGNOSIS — R0609 Other forms of dyspnea: Secondary | ICD-10-CM | POA: Insufficient documentation

## 2014-08-02 DIAGNOSIS — Z8673 Personal history of transient ischemic attack (TIA), and cerebral infarction without residual deficits: Secondary | ICD-10-CM | POA: Insufficient documentation

## 2014-08-02 DIAGNOSIS — Z8781 Personal history of (healed) traumatic fracture: Secondary | ICD-10-CM | POA: Insufficient documentation

## 2014-08-02 DIAGNOSIS — I480 Paroxysmal atrial fibrillation: Secondary | ICD-10-CM | POA: Diagnosis not present

## 2014-08-02 DIAGNOSIS — Z8719 Personal history of other diseases of the digestive system: Secondary | ICD-10-CM | POA: Insufficient documentation

## 2014-08-02 DIAGNOSIS — Z880 Allergy status to penicillin: Secondary | ICD-10-CM | POA: Diagnosis not present

## 2014-08-02 DIAGNOSIS — Z8701 Personal history of pneumonia (recurrent): Secondary | ICD-10-CM | POA: Insufficient documentation

## 2014-08-02 DIAGNOSIS — I252 Old myocardial infarction: Secondary | ICD-10-CM | POA: Insufficient documentation

## 2014-08-02 DIAGNOSIS — I2510 Atherosclerotic heart disease of native coronary artery without angina pectoris: Secondary | ICD-10-CM | POA: Insufficient documentation

## 2014-08-02 DIAGNOSIS — R0602 Shortness of breath: Secondary | ICD-10-CM

## 2014-08-02 DIAGNOSIS — I10 Essential (primary) hypertension: Secondary | ICD-10-CM | POA: Insufficient documentation

## 2014-08-02 DIAGNOSIS — F419 Anxiety disorder, unspecified: Secondary | ICD-10-CM | POA: Diagnosis not present

## 2014-08-02 DIAGNOSIS — D649 Anemia, unspecified: Secondary | ICD-10-CM | POA: Diagnosis not present

## 2014-08-02 DIAGNOSIS — E785 Hyperlipidemia, unspecified: Secondary | ICD-10-CM | POA: Insufficient documentation

## 2014-08-02 DIAGNOSIS — Z79899 Other long term (current) drug therapy: Secondary | ICD-10-CM | POA: Insufficient documentation

## 2014-08-02 DIAGNOSIS — Z872 Personal history of diseases of the skin and subcutaneous tissue: Secondary | ICD-10-CM | POA: Insufficient documentation

## 2014-08-02 DIAGNOSIS — R0600 Dyspnea, unspecified: Secondary | ICD-10-CM

## 2014-08-02 LAB — CBC
HCT: 35.7 % — ABNORMAL LOW (ref 36.0–46.0)
Hemoglobin: 11.7 g/dL — ABNORMAL LOW (ref 12.0–15.0)
MCH: 30.2 pg (ref 26.0–34.0)
MCHC: 32.8 g/dL (ref 30.0–36.0)
MCV: 92 fL (ref 78.0–100.0)
PLATELETS: 301 10*3/uL (ref 150–400)
RBC: 3.88 MIL/uL (ref 3.87–5.11)
RDW: 13.2 % (ref 11.5–15.5)
WBC: 23.7 10*3/uL — ABNORMAL HIGH (ref 4.0–10.5)

## 2014-08-02 LAB — TROPONIN I

## 2014-08-02 MED ORDER — SODIUM CHLORIDE 0.9 % IV SOLN: INTRAVENOUS | Status: DC

## 2014-08-02 NOTE — ED Notes (Signed)
Bed: WA06 Expected date:  Expected time:  Means of arrival:  Comments: 48F [facility]

## 2014-08-02 NOTE — ED Provider Notes (Signed)
CSN: [id number]     Arrival date & time 08/02/14  2219 History   First MD Initiated Contact with Patient 08/02/14 2306     Chief Complaint  Patient presents with  . Shortness of Breath     (Consider location/radiation/quality/duration/timing/severity/associated sxs/prior Treatment) HPI The patient poor she's had shortness of breath developing for approximately 3 weeks duration. She does not have any other specific symptoms. She denies she's had any significant amount cough. She reports she only coughs when she drinks milk and sometimes at night. She denies she's having any fevers. The patient does not exert as she is wheelchair bound. She reports that the shortness of breath comes and goes without any particular inciting factor. She has not noted increasing swelling of her lower legs. She does have an AKA on the right. Past Medical History  Diagnosis Date  . Hypertension   . Atrial fibrillation   . Psoriasis   . Congestive heart failure (CHF)   . History of CVA (cerebrovascular accident)   . Coronary artery disease   . Myocardial infarction   . CHF (congestive heart failure)   . Shortness of breath   . H/O hiatal hernia   . Closed fracture of head of left humerus 5/12  . Basal cell carcinoma   . 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  Flow cytometry 04/04/12: 91% cells CD4 positive CD 26 negative  . Hyperlipemia   . Pneumonia   . Wound of right leg 09/25/2012    Necrotic, open wound right lower leg; non healing  . Stroke   . Hyposmolality and/or hyponatremia   . Lower limb amputation, above knee 10/11/2012  . Pain in joint, multiple sites   . Mycosis fungoides, unspecified site, extranodal and solid organ sites 09/28/2012  . Occlusion and stenosis of carotid artery without mention of cerebral infarction 09/28/2012  . Chronic venous  hypertension with ulcer and inflammation 09/28/2012  . Diverticulosis of colon (without mention of hemorrhage) 09/28/2012  . Synovial cyst of popliteal space 03/26/2012  . Unspecified constipation 03/24/2012  . Hypopotassemia 01/26/2012  . Debility, unspecified 01/17/2012  . Anxiety state, unspecified 12/31/2011  . Anemia, unspecified 12/21/2011  . Other specified disease of white blood cells 12/21/2011  . Peripheral vascular disease, unspecified 12/21/2011    recent ABI Lt of 0.64 down from ).80 on 12/29/11- though different labs  . Reflux esophagitis 12/21/2011  . Unspecified hereditary and idiopathic peripheral neuropathy 12/14/2011  . Pain in joint, ankle and foot 12/14/2011  . Carotid stenosis     CAROTID DOPPLER, 05/02/2012 - LEFT VERTEBRAL-occluded, LEFT BULB AND PROXIMAL ICA STENT-moderate amount of irregular mixed dense plaque 50-69% diameter reduction  . Pre-syncope     NUCLEAR STRESS TEST, 12/10/2009 - normal, EKG negative for ischemia  . TIA (transient ischemic attack)     2D ECHO, 12/05/2012 - EF 60-65%, Severely calcified annulus of the mitral valve with mild-moderate regurgitation, LA moderate-severely dilated  . Paroxysmal atrial fibrillation   . CAD in native artery     3 vessel CAD, medical therapy, not a candidate for CABG  . S/P AKA (above knee amputation) unilateral, Rt leg due to PAD and non healing wound 06/27/2013  . HCAP (healthcare-associated pneumonia) 01/08/2012  . 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  Tonsillectomy    . Dilation and curettage of uterus    . Eye surgery    . Cataracts    . Vertebroplasty    . Amputation  10/10/2012    Procedure: AMPUTATION ABOVE KNEE;  Surgeon: Newt Minion, MD;  Location: [facility];  Service: Orthopedics;  Laterality: Right;  . Percutaneous stent intervention  04/13/2011    Left internal carotid artery stented with a 8x30 prcise nitinol  self-expanding stent resulting reduction of 90% to less than 20% residual  . Cardiac catheterization  11/06/2010    CABG recommended, turned down by [facility] secondary to co morbidities   Family History  Problem Relation Age of Onset  . Stomach cancer Mother   . Pneumonia Father   . Cirrhosis Brother   . Diabetes Brother   . Heart attack Paternal Grandmother   . Heart failure Sister   . Diabetes Sister   . Heart failure Sister    History  Substance Use Topics  . Smoking status: Never Smoker   . Smokeless tobacco: Never Used  . Alcohol Use: No   OB History    No data available     Review of Systems 10 Systems reviewed and are negative for acute change except as noted in the HPI.    Allergies  Evista; Penicillins; Triamterene; and Alendronate sodium  Home Medications   Prior to Admission medications   Medication Sig Start Date End Date Taking? Authorizing Provider  atorvastatin (LIPITOR) 40 MG tablet Take 1 tablet (40 mg total) by mouth daily at 6 PM. 12/05/12  Yes Nita Sells, MD  bexarotene (TARGRETIN) 75 MG CAPS capsule Take 4 capsules (300 mg total) by mouth daily before supper. Give with food. Protect from light. CAUTION: Chemotherapy/Biotherapy-Wear gloves when handling 06/04/14 09/04/14 Yes Heath Lark, MD  carvedilol (COREG) 12.5 MG tablet Take 1 tablet (12.5 mg total) by mouth 2 (two) times daily with a meal. 08/30/13  Yes Janece Canterbury, MD  dabigatran (PRADAXA) 150 MG CAPS Take 75 mg by mouth every 12 (twelve) hours.  12/05/12  Yes Nita Sells, MD  diltiazem (CARDIZEM CD) 120 MG 24 hr capsule Take 120 mg by mouth daily.    Yes Historical Provider, MD  erythromycin ophthalmic ointment Place 1 application into both eyes See admin instructions. Apply to both eye lids at bedtime for 5 days then apply as needed   Yes Historical Provider, MD  famotidine (PEPCID) 40 MG tablet Take 40 mg by mouth daily. At 4pm.   Yes Historical Provider, MD  folic acid (FOLVITE) 1  MG tablet Take 1 mg by mouth daily.   Yes Historical Provider, MD  furosemide (LASIX) 40 MG tablet Take 1.5 tablets (60 mg total) by mouth 2 (two) times daily. 08/30/13  Yes Janece Canterbury, MD  furosemide (LASIX) 40 MG tablet Take 40 mg by mouth See admin instructions. 1 tab daily as needed for weight gain of 2-3 lbs in 24 hours or 3-5lbs in 1 week.   Yes Historical Provider, MD  gabapentin (NEURONTIN) 100 MG capsule Take 1 capsule (100 mg total) by mouth at bedtime. 12/05/12  Yes Nita Sells, MD  guaiFENesin (MUCINEX) 600 MG 12 hr tablet Take by mouth 2 (two) times daily.   Yes Historical Provider, MD  HYDROcodone-acetaminophen (NORCO/VICODIN) 5-325 MG per tablet Take 1 tablet by mouth every 6 (six) hours as needed for moderate pain.   Yes Historical Provider, MD  ipratropium-albuterol (DUONEB) 0.5-2.5 (3) MG/3ML SOLN Take 3 mLs by nebulization every 6 (  six) hours as needed.    Yes Historical Provider, MD  isosorbide mononitrate (IMDUR) 30 MG 24 hr tablet Take 1 tablet (30 mg total) by mouth daily. 06/27/13  Yes Isaiah Serge, NP  lisinopril (PRINIVIL,ZESTRIL) 5 MG tablet Take 5 mg by mouth daily.   Yes Historical Provider, MD  Omega-3 Fatty Acids (FISH OIL) 1000 MG CAPS Take 1,000 mg by mouth at bedtime.    Yes Historical Provider, MD  polyethylene glycol (MIRALAX / GLYCOLAX) packet Take 17 g by mouth every other day. Take 1 packet mixed in 4 oz of beverage every day to relieve constipation.   Yes Historical Provider, MD  Polyvinyl Alcohol-Povidone (REFRESH OP) Apply 1 drop to eye 4 (four) times daily. Instill 1 drop to both eyes four times daily.*Wait 3-5 minutes between eye drops*   Yes Historical Provider, MD  famotidine (PEPCID) 20 MG tablet Take 40 mg by mouth daily. At 4pm    Historical Provider, MD  levocetirizine (XYZAL) 5 MG tablet Take 5 mg by mouth as needed for allergies.    Historical Provider, MD  nitroGLYCERIN (NITROSTAT) 0.4 MG SL tablet Place 0.4 mg under the tongue every 5  (five) minutes as needed for chest pain. If no relief call MD.    Historical Provider, MD  triamcinolone cream (KENALOG) 0.1 % Apply topically as directed. 09/25/13   Historical Provider, MD  vitamin C (ASCORBIC ACID) 500 MG tablet Take 500 mg by mouth daily.    Historical Provider, MD   BP 179/69 mmHg  Pulse 72  Resp 16  SpO2 99% Physical Exam  Constitutional: She is oriented to person, place, and time. She appears well-developed and well-nourished.  HENT:  Head: Normocephalic and atraumatic.  Eyes: Pupils are equal, round, and reactive to light.  The patient's lid margins are very erythematous.  Neck: Neck supple.  Cardiovascular: Normal rate, regular rhythm, normal heart sounds and intact distal pulses.   Pulmonary/Chest: Effort normal. No respiratory distress. She has rales (the patient has Rales bilaterally left slightly greater than right.).  Abdominal:  The abdomen has moderate distention without any tenderness. No guarding.  Musculoskeletal:  The patient has right AKA. Left lower extremity has trace pedal edema. No calf tenderness.  Neurological: She is alert and oriented to person, place, and time. Coordination normal.  Skin: Skin is warm and dry. There is erythema (the patient's skin is generally very erythematous. At this point time there are no active vesicles or bullae. This is a chronic condition. No worse than baseline per patient's report.).  Psychiatric: She has a normal mood and affect.    ED Course  Procedures (including critical care time) Labs Review Labs Reviewed  CBC - Abnormal; Notable for the following:    WBC 23.7 (*)    Hemoglobin 11.7 (*)    HCT 35.7 (*)    All other components within normal limits  COMPREHENSIVE METABOLIC PANEL - Abnormal; Notable for the following:    Sodium 135 (*)    Chloride 94 (*)    Albumin 3.2 (*)    Total Bilirubin 0.2 (*)    GFR calc non Af Amer 70 (*)    GFR calc Af Amer 81 (*)    All other components within normal  limits  URINALYSIS, ROUTINE W REFLEX MICROSCOPIC - Abnormal; Notable for the following:    Hgb urine dipstick TRACE (*)    Leukocytes, UA MODERATE (*)    All other components within normal limits  URINE MICROSCOPIC-ADD ON -  Abnormal; Notable for the following:    Bacteria, UA FEW (*)    All other components within normal limits  TROPONIN I  PRO B NATRIURETIC PEPTIDE    Imaging Review Dg Chest 2 View  08/02/2014   CLINICAL DATA:  Shortness of breath for 3 weeks. Wheezing. History of atrial fibrillation, hypertension and congestive heart failure. Initial encounter.  EXAM: CHEST  2 VIEW  COMPARISON:  08/30/2013 and 08/27/2013.  FINDINGS: Stable cardiomegaly, aortic atherosclerosis and mitral annular calcifications. There is improved aeration in both lung bases with decreased bilateral pleural effusions. Mild chronic vascular congestion is present. The bones are diffusely demineralized. There is an old fracture of the left humeral neck and lower thoracic spine, the latter treated with spinal augmentation. No acute osseous findings evident.  IMPRESSION: Stable cardiomegaly and chronic vascular congestion. Improved basilar aeration and pleural effusions.   Electronically Signed   By: Camie Patience M.D.   On: 08/02/2014 23:42     EKG Interpretation   Date/Time:  Friday August 02 2014 23:16:48 EST Ventricular Rate:  63 PR Interval:  193 QRS Duration: 137 QT Interval:  489 QTC Calculation: 501 R Axis:   93 Text Interpretation:  Sinus rhythm RBBB and LPFB agree. No STEMI.increased  interventricular conduction delay. 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 At this point in time there are no acute findings in her diagnostic workup. The patient has multiple chronic medical conditions. The  patient does not appear to be in acute congestive heart failure. The chest x-ray does not show infiltrate or consolidation and shows improved aeration and decreased pleural effusion compared with prior. She does not have fever or significant cough to suggest pneumonia or bronchitis. She is alert and nontoxic without acute respiratory distress. At this time she will be instructed to follow her family physician on Monday.    Charlesetta Shanks, MD 08/03/14 0110

## 2014-08-02 NOTE — ED Notes (Signed)
Per EMS, pt has had shortness of breath for the past 3 weeks. Pt received duoneb at 8:30PM, which did not provide relief. Nursing home called pt's family, which requested she get checked out at the ER. Pt denies pain, has wheezing in lower left lobe, diminished in right lower, clear in upper lobes. Pt A&Ox4.

## 2014-08-03 DIAGNOSIS — C8448 Peripheral T-cell lymphoma, not classified, lymph nodes of multiple sites: Secondary | ICD-10-CM | POA: Diagnosis not present

## 2014-08-03 LAB — URINALYSIS, ROUTINE W REFLEX MICROSCOPIC
Bilirubin Urine: NEGATIVE
GLUCOSE, UA: NEGATIVE mg/dL
KETONES UR: NEGATIVE mg/dL
NITRITE: NEGATIVE
PROTEIN: NEGATIVE mg/dL
Specific Gravity, Urine: 1.007 (ref 1.005–1.030)
Urobilinogen, UA: 0.2 mg/dL (ref 0.0–1.0)
pH: 6.5 (ref 5.0–8.0)

## 2014-08-03 LAB — COMPREHENSIVE METABOLIC PANEL
ALT: 11 U/L (ref 0–35)
AST: 19 U/L (ref 0–37)
Albumin: 3.2 g/dL — ABNORMAL LOW (ref 3.5–5.2)
Alkaline Phosphatase: 55 U/L (ref 39–117)
Anion gap: 14 (ref 5–15)
BUN: 20 mg/dL (ref 6–23)
CALCIUM: 9.4 mg/dL (ref 8.4–10.5)
CO2: 27 meq/L (ref 19–32)
CREATININE: 0.79 mg/dL (ref 0.50–1.10)
Chloride: 94 mEq/L — ABNORMAL LOW (ref 96–112)
GFR calc Af Amer: 81 mL/min — ABNORMAL LOW (ref >90)
GFR, EST NON AFRICAN AMERICAN: 70 mL/min — AB (ref >90)
GLUCOSE: 90 mg/dL (ref 70–99)
Potassium: 4.2 mEq/L (ref 3.7–5.3)
SODIUM: 135 meq/L — AB (ref 137–147)
TOTAL PROTEIN: 7.1 g/dL (ref 6.0–8.3)
Total Bilirubin: 0.2 mg/dL — ABNORMAL LOW (ref 0.3–1.2)

## 2014-08-03 LAB — URINE MICROSCOPIC-ADD ON

## 2014-08-03 NOTE — ED Notes (Signed)
Communications notified of patient's discharge back to facility.  Spoke with Minette Brine.

## 2014-08-03 NOTE — Discharge Instructions (Signed)

## 2014-08-06 LAB — URINE CULTURE: Colony Count: 50000

## 2014-08-08 ENCOUNTER — Telehealth (HOSPITAL_BASED_OUTPATIENT_CLINIC_OR_DEPARTMENT_OTHER): Payer: Self-pay | Admitting: *Deleted

## 2014-08-08 NOTE — Telephone Encounter (Signed)
(+)  urine culture, FU with PCP Monday 08/05/2014, no further treatment needed per [facility] Bibles, [facility]

## 2014-08-09 LAB — BASIC METABOLIC PANEL
BUN: 16 mg/dL (ref 4–21)
Creatinine: 0.8 mg/dL (ref 0.5–1.1)
Glucose: 75 mg/dL
POTASSIUM: 3.7 mmol/L (ref 3.4–5.3)
SODIUM: 139 mmol/L (ref 137–147)

## 2014-08-09 LAB — CBC AND DIFFERENTIAL
HCT: 35 % — AB (ref 36–46)
HEMOGLOBIN: 11.7 g/dL — AB (ref 12.0–16.0)
Platelets: 315 10*3/uL (ref 150–399)
WBC: 24.2 10^3/mL

## 2014-08-09 LAB — HEPATIC FUNCTION PANEL
ALT: 8 U/L (ref 7–35)
AST: 14 U/L (ref 13–35)
Alkaline Phosphatase: 47 U/L (ref 25–125)
Bilirubin, Total: 0.4 mg/dL

## 2014-08-12 ENCOUNTER — Non-Acute Institutional Stay (SKILLED_NURSING_FACILITY): Payer: Medicare Other | Admitting: Nurse Practitioner

## 2014-08-12 ENCOUNTER — Encounter: Payer: Self-pay | Admitting: Nurse Practitioner

## 2014-08-12 DIAGNOSIS — G546 Phantom limb syndrome with pain: Secondary | ICD-10-CM

## 2014-08-12 DIAGNOSIS — R0602 Shortness of breath: Secondary | ICD-10-CM

## 2014-08-12 DIAGNOSIS — C8448 Peripheral T-cell lymphoma, not classified, lymph nodes of multiple sites: Secondary | ICD-10-CM

## 2014-08-12 DIAGNOSIS — K2100 Gastro-esophageal reflux disease with esophagitis, without bleeding: Secondary | ICD-10-CM

## 2014-08-12 DIAGNOSIS — I2510 Atherosclerotic heart disease of native coronary artery without angina pectoris: Secondary | ICD-10-CM

## 2014-08-12 DIAGNOSIS — K5900 Constipation, unspecified: Secondary | ICD-10-CM

## 2014-08-12 DIAGNOSIS — I10 Essential (primary) hypertension: Secondary | ICD-10-CM

## 2014-08-12 DIAGNOSIS — I2583 Coronary atherosclerosis due to lipid rich plaque: Secondary | ICD-10-CM

## 2014-08-12 DIAGNOSIS — I480 Paroxysmal atrial fibrillation: Secondary | ICD-10-CM

## 2014-08-12 DIAGNOSIS — I5032 Chronic diastolic (congestive) heart failure: Secondary | ICD-10-CM

## 2014-08-12 NOTE — Assessment & Plan Note (Signed)
Stable, takes Famotidine 40mg  daily.

## 2014-08-12 NOTE — Assessment & Plan Note (Signed)
Controlled, takes Coreg 12,5mg  bid, Cardizem DC 120mg  ,  Imdur 30mg ,  and lisinopril 5mg 

## 2014-08-12 NOTE — Assessment & Plan Note (Signed)
Dr. Beryle Beams Oncology routinely. Currently she is taking Targretin 225mg  daily before supper.

## 2014-08-12 NOTE — Progress Notes (Signed)
Patient ID: Sara [name], female   DOB: [birth year], 80 y.o.   MRN: [id number]   Code Status: DNR  Allergies  Allergen Reactions  . Evista [Raloxifene]     unknown  . Penicillins Other (See Comments)    unknown  . Triamterene Other (See Comments)    unknown  . Alendronate Sodium Rash    Chief Complaint  Patient presents with  . Medical Management of Chronic Issues    HPI: Patient is a 80 y.o. female seen in the SNF at [facility] today for evaluation of chronic medical conditions.   Hospitalization from 08/27/2013-08/30/2013 for CHF-Furosemide was increased to 60mg  bid and decreased Carvedilol due to bradycardia. Had proBNP 2534, d-dimer 2.97, chest x-ray with central vascular congestion and bilateral pleural effusion.  CBC showed WBC 32.8. BMP was unremarkable.   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 Beryle Beams Oncology routinely. Currently she is taking Targretin 225mg  daily before supper.        PAF (paroxysmal atrial fibrillation)    Paroxysmal atrial fibrillation. Several episodes of RVR in 2014. Typically rate controlled when in a. Fib.      HTN (hypertension) - Primary    Controlled, takes Coreg 12,5mg  bid, Cardizem DC 120mg  ,  Imdur 30mg ,  and lisinopril 5mg       GERD (gastroesophageal reflux disease)    Stable, takes Famotidine 40mg  daily.       Constipation    Managed with MiraLax qod      Chronic diastolic HF (heart failure) (Chronic)    OE, BNP 355.7 02/22/14. CXR 02/21/14 minimal to mild pulmonary venous hypertension w/o other overt congestive heart failure. Moist rales posterior lower lungs. Cardiology 05/30/14      CAD- 3V March 2012- medical Rx (Chronic)    3 vessel CAD, medical therapy, not a candidate for CABG          Review  of Systems:  Review of Systems  Constitutional: Negative for fever, chills, weight loss, malaise/fatigue and diaphoresis.  HENT: Positive for hearing loss. Negative for congestion, ear discharge, ear pain and sore throat.   Eyes: Negative for pain, discharge and redness.  Respiratory: Positive for cough and shortness of breath. Negative for sputum production and wheezing.        DOE  Cardiovascular: Positive for leg swelling and PND. Negative for chest pain, palpitations, orthopnea and claudication.       Trace only LLE  Gastrointestinal: Negative for heartburn, nausea, vomiting, abdominal pain, diarrhea, constipation and blood in stool.  Genitourinary: Positive for frequency (incontinent of bladder). Negative for dysuria, urgency, hematuria and flank pain.  Musculoskeletal: Positive for joint pain. Negative for myalgias, back pain, falls and neck pain.  Skin: Positive for itching and rash.       Chronic psoriasis, very thin and red skin, frequent skin abrasion or tear. A hematoma/abrasion left upper LE below the left knee-not infected. A new hematoma LLE sustained 08/09/14 from mechanical lift during transfer.   Neurological: Negative for dizziness, tingling, tremors, sensory change, speech change, focal weakness, seizures, loss of consciousness, weakness (generalized) and headaches.  Endo/Heme/Allergies: Negative for environmental allergies and polydipsia. Bruises/bleeds easily.  Psychiatric/Behavioral: Positive for memory loss. Negative for depression and hallucinations. The  patient is not nervous/anxious and does not have insomnia.      Past Medical History  Diagnosis Date  . Hypertension   . Atrial fibrillation   . Psoriasis   . Congestive heart failure (CHF)   . History of CVA (cerebrovascular accident)   . Coronary artery disease   . Myocardial infarction   . CHF (congestive heart failure)   . Shortness of breath   . H/O hiatal hernia   . Closed fracture of head of left humerus  5/12  . Basal cell carcinoma   . 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  Flow cytometry 04/04/12: 91% cells CD4 positive CD 26 negative  . Hyperlipemia   . Pneumonia   . Wound of right leg 09/25/2012    Necrotic, open wound right lower leg; non healing  . Stroke   . Hyposmolality and/or hyponatremia   . Lower limb amputation, above knee 10/11/2012  . Pain in joint, multiple sites   . Mycosis fungoides, unspecified site, extranodal and solid organ sites 09/28/2012  . Occlusion and stenosis of carotid artery without mention of cerebral infarction 09/28/2012  . Chronic venous hypertension with ulcer and inflammation 09/28/2012  . Diverticulosis of colon (without mention of hemorrhage) 09/28/2012  . Synovial cyst of popliteal space 03/26/2012  . Unspecified constipation 03/24/2012  . Hypopotassemia 01/26/2012  . Debility, unspecified 01/17/2012  . Anxiety state, unspecified 12/31/2011  . Anemia, unspecified 12/21/2011  . Other specified disease of white blood cells 12/21/2011  . Peripheral vascular disease, unspecified 12/21/2011    recent ABI Lt of 0.64 down from ).80 on 12/29/11- though different labs  . Reflux esophagitis 12/21/2011  . Unspecified hereditary and idiopathic peripheral neuropathy 12/14/2011  . Pain in joint, ankle and foot 12/14/2011  . Carotid stenosis     CAROTID DOPPLER, 05/02/2012 - LEFT VERTEBRAL-occluded, LEFT BULB AND PROXIMAL ICA STENT-moderate amount of irregular mixed dense plaque 50-69% diameter reduction  . Pre-syncope     NUCLEAR STRESS TEST, 12/10/2009 - normal, EKG negative for ischemia  . TIA (transient ischemic attack)     2D ECHO, 12/05/2012 - EF 60-65%, Severely calcified annulus of the mitral valve with mild-moderate regurgitation, LA moderate-severely dilated  . Paroxysmal atrial fibrillation   . CAD in native artery      3 vessel CAD, medical therapy, not a candidate for CABG  . S/P AKA (above knee amputation) unilateral, Rt leg due to PAD and non healing wound 06/27/2013  . HCAP (healthcare-associated pneumonia) 01/08/2012  . 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 Tonsillectomy    . Dilation and curettage of uterus    . Eye surgery    . Cataracts    . Vertebroplasty    . Amputation  10/10/2012    Procedure: AMPUTATION ABOVE KNEE;  Surgeon: Newt Minion, MD;  Location: [place];  Service: Orthopedics;  Laterality: Right;  . Percutaneous stent intervention  04/13/2011    Left internal carotid artery stented with a 8x30 prcise nitinol self-expanding stent resulting reduction of 90% to less than 20% residual  . Cardiac catheterization  11/06/2010    CABG recommended, turned down by [facility] secondary to co morbidities   Social History:   reports that she has never smoked. She has never used smokeless tobacco. She  reports that she does not drink alcohol or use illicit drugs.  Family History  Problem Relation Age of Onset  . Stomach cancer Mother   . Pneumonia Father   . Cirrhosis Brother   . Diabetes Brother   . Heart attack Paternal Grandmother   . Heart failure Sister   . Diabetes Sister   . Heart failure Sister     Medications: Patient's Medications  New Prescriptions   No medications on file  Previous Medications   ATORVASTATIN (LIPITOR) 40 MG TABLET    Take 1 tablet (40 mg total) by mouth daily at 6 PM.   BEXAROTENE (TARGRETIN) 75 MG CAPS CAPSULE    Take 4 capsules (300 mg total) by mouth daily before supper. Give with food. Protect from light. CAUTION: Chemotherapy/Biotherapy-Wear gloves when handling   CARVEDILOL (COREG) 12.5 MG TABLET    Take 1 tablet (12.5 mg total) by mouth 2 (two) times daily with a meal.   DABIGATRAN (PRADAXA) 150 MG CAPS    Take 75 mg by mouth every 12 (twelve) hours.    DILTIAZEM  (CARDIZEM CD) 120 MG 24 HR CAPSULE    Take 120 mg by mouth daily.    ERYTHROMYCIN OPHTHALMIC OINTMENT    Place 1 application into both eyes See admin instructions. Apply to both eye lids at bedtime for 5 days then apply as needed   FAMOTIDINE (PEPCID) 20 MG TABLET    Take 40 mg by mouth daily. At 4pm   FAMOTIDINE (PEPCID) 40 MG TABLET    Take 40 mg by mouth daily. At 4pm.   FOLIC ACID (FOLVITE) 1 MG TABLET    Take 1 mg by mouth daily.   FUROSEMIDE (LASIX) 40 MG TABLET    Take 1.5 tablets (60 mg total) by mouth 2 (two) times daily.   FUROSEMIDE (LASIX) 40 MG TABLET    Take 40 mg by mouth See admin instructions. 1 tab daily as needed for weight gain of 2-3 lbs in 24 hours or 3-5lbs in 1 week.   GABAPENTIN (NEURONTIN) 100 MG CAPSULE    Take 1 capsule (100 mg total) by mouth at bedtime.   GUAIFENESIN (MUCINEX) 600 MG 12 HR TABLET    Take by mouth 2 (two) times daily.   HYDROCODONE-ACETAMINOPHEN (NORCO/VICODIN) 5-325 MG PER TABLET    Take 1 tablet by mouth every 6 (six) hours as needed for moderate pain.   IPRATROPIUM-ALBUTEROL (DUONEB) 0.5-2.5 (3) MG/3ML SOLN    Take 3 mLs by nebulization every 6 (six) hours as needed.    ISOSORBIDE MONONITRATE (IMDUR) 30 MG 24 HR TABLET    Take 1 tablet (30 mg total) by mouth daily.   LEVOCETIRIZINE (XYZAL) 5 MG TABLET    Take 5 mg by mouth as needed for allergies.   LISINOPRIL (PRINIVIL,ZESTRIL) 5 MG TABLET    Take 5 mg by mouth daily.   NITROGLYCERIN (NITROSTAT) 0.4 MG SL TABLET    Place 0.4 mg under the tongue every 5 (five) minutes as needed for chest pain. If no relief call MD.   OMEGA-3 FATTY ACIDS (FISH OIL) 1000 MG CAPS    Take 1,000 mg by mouth at bedtime.    POLYETHYLENE GLYCOL (MIRALAX / GLYCOLAX) PACKET    Take 17 g by mouth every other day. Take 1 packet mixed in 4 oz of beverage every day to relieve constipation.   POLYVINYL ALCOHOL-POVIDONE (REFRESH OP)    Apply 1 drop to eye 4 (four) times daily. Instill 1 drop to both eyes  four times daily.*Wait 3-5  minutes between eye drops*   TRIAMCINOLONE CREAM (KENALOG) 0.1 %    Apply topically as directed.   VITAMIN C (ASCORBIC ACID) 500 MG TABLET    Take 500 mg by mouth daily.  Modified Medications   No medications on file  Discontinued Medications   No medications on file     Physical Exam: Physical Exam  Constitutional: She is oriented to person, place, and time. She appears well-developed and well-nourished. No distress.  HENT:  Head: Normocephalic.  Right Ear: External ear normal.  Left Ear: External ear normal.  Nose: Nose normal.  Mouth/Throat: Oropharynx is clear and moist. No oropharyngeal exudate.  Eyes: EOM are normal. Pupils are equal, round, and reactive to light. Right conjunctiva is injected.  Only a few eyelashes remained and right conjunctiva mild irritated with bilateral lower eyelid eversion.   Neck: Normal range of motion. Neck supple. JVD present. No thyromegaly present.  Cardiovascular: Normal rate and regular rhythm.  Exam reveals decreased pulses.   Murmur heard. Pulses:      Dorsalis pedis pulses are 1+ on the left side.       Posterior tibial pulses are 1+ on the left side.  3/6  Pulmonary/Chest: Effort normal. No respiratory distress. She has no wheezes. She has rales. She exhibits no tenderness.  Decreased breath sounds posterior mid lungs. Moist rales posterior lower lungs.   Abdominal: Soft. Bowel sounds are normal. She exhibits no distension. There is no tenderness.  Genitourinary:  Incontinent of bladder  Musculoskeletal: Normal range of motion. She exhibits edema. She exhibits no tenderness.  R AKA. Left lower leg edema appears worse 1+  Lymphadenopathy:    She has no cervical adenopathy.  Neurological: She is alert and oriented to person, place, and time. She has normal reflexes. No cranial nerve deficit. She exhibits normal muscle tone. Coordination normal.  Skin: Rash noted. There is erythema.  Diffused erythematous scaly skin appearance. Psoriasis.  A hematoma/abrasion left upper LE below the left knee-not infected.  A new hematoma LLE sustained 08/09/14 from mechanical lift during transfer.     Psychiatric: She has a normal mood and affect. Her behavior is normal. Judgment and thought content normal. She exhibits abnormal recent memory.    Filed Vitals:   08/12/14 1321  BP: 118/70  Pulse: 82  Temp: 97.3 F (36.3 C)  TempSrc: Tympanic  Resp: 20      Labs reviewed: Basic Metabolic Panel:  Recent Labs  08/27/13 1835  08/30/13 0453 09/11/13 1527  12/28/13 1417  02/27/14 1049 05/30/14 1344 07/31/14 08/02/14 2317 08/09/14  NA  --   < > 134*  --   < >  --   < > 140 135 137 135* 139  K  --   < > 4.0  --   < >  --   < > 3.9 3.9 4.1 4.2 3.7  CL  --   < > 94*  --   --   --   --   --  99  --  94*  --   CO2  --   < > 29  --   < >  --   < > 31* 27  --  27  --   GLUCOSE  --   < > 103*  --   < >  --   < > 161* 137*  --  90  --   BUN  --   < > 30*  --   < >  --   < >  15.9 18 18 20 16   CREATININE  --   < > 0.85  --   < >  --   < > 0.9 0.81 0.8 0.79 0.8  CALCIUM  --   < > 9.4  --   < >  --   < > 9.7 9.3  --  9.4  --   MG 2.0  --   --   --   --   --   --   --   --   --   --   --   TSH  --   --   --  1.009  --  1.393  --   --   --  0.74  --   --   < > = values in this interval not displayed. Liver Function Tests:  Recent Labs  02/27/14 1049 05/30/14 1344 07/31/14 08/02/14 2317 08/09/14  AST 16 20 16 19 14   ALT 9 14 8 11 8   ALKPHOS 72 52 51 55 47  BILITOT 0.32 0.3  --  0.2*  --   PROT 7.2 7.0  --  7.1  --   ALBUMIN 3.5 3.7  --  3.2*  --     Recent Labs  09/11/13 1529  LIPASE 29  AMYLASE 22   CBC:  Recent Labs  12/28/13 1417  02/27/14 1049  05/30/14 1308  07/31/14 08/02/14 2317 08/09/14  WBC 27.1*  < > 26.5*  < > 24.6*  < > 21.9 23.7* 24.2  NEUTROABS 5.3  --  5.5  --  6.9*  --   --   --   --   HGB 12.3  < > 12.8  < > 13.2  < > 11.7* 11.7* 11.7*  HCT 38.3  < > 39.0  < > 40.2  < > 36 35.7* 35*  MCV 94.1  --   92.0  --  91.6  --   --  92.0  --   PLT 263  < > 276  < > 250  < > 318 301 315  < > = values in this interval not displayed. Lipid Panel:  Recent Labs  09/11/13 1529 12/28/13 1419 07/31/14  CHOL  --  220*  --   HDL  --  30*  --   LDLCALC  --  134*  --   TRIG 303* 281* 264*  CHOLHDL  --  7.3  --     Past Procedures:  Dg Chest Port 1 View  08/30/2013 CLINICAL DATA: History of CHF with worsening. EXAM: PORTABLE CHEST - 1 VIEW  IMPRESSION: The findings are consistent with the CHF with evidence of some mild improvement in the interstitial edema since the earlier study.   Dg Chest Portable 1 View  08/27/2013 CLINICAL DATA: Cough, congestion EXAM: PORTABLE CHEST - 1 VIEW IMPRESSION: Central vascular congestion without convincing pulmonary edema. Bilateral small pleural effusion with bilateral basilar atelectasis or infiltrate.  12/20/13 CXR no cardiomegaly or pulmonary vascular congestion, small bilateral pleural effusions unchanged, patchy bibasilar atelectasis or pneumonia worse on the right and unchanged on the left, old granulomatous disease re demonstrated.   02/21/14 CXR minimal to mild pulmonary venous hypertension without other overt congestive heart failure findings, emphysema/COPD, bilateral subsegmental atelectatic changes, but negative for suspected focal pneumonia.   Assessment/Plan CAD- 3V March 2012- medical Rx 3 vessel CAD, medical therapy, not a candidate for CABG     Chronic diastolic HF (heart failure) OE, BNP 355.7 02/22/14. CXR 02/21/14 minimal to  mild pulmonary venous hypertension w/o other overt congestive heart failure. Moist rales posterior lower lungs. Cardiology 05/30/14    Constipation Managed with MiraLax qod    GERD (gastroesophageal reflux disease) Stable, takes Famotidine 40mg  daily.     HTN (hypertension) Controlled, takes Coreg 12,5mg  bid, Cardizem DC 120mg  ,  Imdur 30mg ,  and lisinopril 5mg     PAF (paroxysmal atrial  fibrillation) Paroxysmal atrial fibrillation. Several episodes of RVR in 2014. Typically rate controlled when in a. Fib.    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 Beryle Beams [facility]cology routinely. Currently she is taking Targretin 225mg  daily before supper.      Phantom limb pain Right AKA--managed with Gabapentin 100mg  nightly     SOB (shortness of breath) CHF-SOB     Family/ Staff Communication: observe the patient.   Goals of Care: SNF  Labs/tests ordered: none

## 2014-08-12 NOTE — Assessment & Plan Note (Signed)
Paroxysmal atrial fibrillation. Several episodes of RVR in 2014. Typically rate controlled when in a. Fib.

## 2014-08-12 NOTE — Assessment & Plan Note (Signed)
3 vessel CAD, medical therapy, not a candidate for CABG

## 2014-08-12 NOTE — Assessment & Plan Note (Signed)
Managed with MiraLax qod

## 2014-08-12 NOTE — Assessment & Plan Note (Signed)
CHF-SOB

## 2014-08-12 NOTE — Assessment & Plan Note (Signed)
OE, BNP 355.7 02/22/14. CXR 02/21/14 minimal to mild pulmonary venous hypertension w/o other overt congestive heart failure. Moist rales posterior lower lungs. Cardiology 05/30/14

## 2014-08-12 NOTE — Assessment & Plan Note (Signed)
Right AKA--managed with Gabapentin 100mg  nightly

## 2014-08-16 ENCOUNTER — Inpatient Hospital Stay (HOSPITAL_COMMUNITY)
Admission: EM | Admit: 2014-08-16 | Discharge: 2014-08-27 | DRG: 871 | Payer: Medicare Other | Attending: Internal Medicine | Admitting: Internal Medicine

## 2014-08-16 ENCOUNTER — Inpatient Hospital Stay (HOSPITAL_COMMUNITY): Payer: Medicare Other

## 2014-08-16 ENCOUNTER — Ambulatory Visit: Payer: Medicare Other | Admitting: Cardiovascular Disease

## 2014-08-16 ENCOUNTER — Emergency Department (HOSPITAL_COMMUNITY): Payer: Medicare Other

## 2014-08-16 ENCOUNTER — Encounter (HOSPITAL_COMMUNITY): Payer: Self-pay

## 2014-08-16 DIAGNOSIS — Z89611 Acquired absence of right leg above knee: Secondary | ICD-10-CM

## 2014-08-16 DIAGNOSIS — D638 Anemia in other chronic diseases classified elsewhere: Secondary | ICD-10-CM | POA: Diagnosis present

## 2014-08-16 DIAGNOSIS — R627 Adult failure to thrive: Secondary | ICD-10-CM

## 2014-08-16 DIAGNOSIS — G9340 Encephalopathy, unspecified: Secondary | ICD-10-CM | POA: Diagnosis not present

## 2014-08-16 DIAGNOSIS — R509 Fever, unspecified: Secondary | ICD-10-CM

## 2014-08-16 DIAGNOSIS — E870 Hyperosmolality and hypernatremia: Secondary | ICD-10-CM | POA: Diagnosis not present

## 2014-08-16 DIAGNOSIS — Y95 Nosocomial condition: Secondary | ICD-10-CM | POA: Diagnosis present

## 2014-08-16 DIAGNOSIS — J449 Chronic obstructive pulmonary disease, unspecified: Secondary | ICD-10-CM | POA: Diagnosis not present

## 2014-08-16 DIAGNOSIS — J189 Pneumonia, unspecified organism: Secondary | ICD-10-CM | POA: Diagnosis present

## 2014-08-16 DIAGNOSIS — I10 Essential (primary) hypertension: Secondary | ICD-10-CM | POA: Diagnosis not present

## 2014-08-16 DIAGNOSIS — J9601 Acute respiratory failure with hypoxia: Secondary | ICD-10-CM | POA: Diagnosis not present

## 2014-08-16 DIAGNOSIS — N390 Urinary tract infection, site not specified: Secondary | ICD-10-CM | POA: Diagnosis not present

## 2014-08-16 DIAGNOSIS — Z9889 Other specified postprocedural states: Secondary | ICD-10-CM

## 2014-08-16 DIAGNOSIS — J690 Pneumonitis due to inhalation of food and vomit: Secondary | ICD-10-CM | POA: Diagnosis present

## 2014-08-16 DIAGNOSIS — R7881 Bacteremia: Secondary | ICD-10-CM | POA: Insufficient documentation

## 2014-08-16 DIAGNOSIS — Z6826 Body mass index (BMI) 26.0-26.9, adult: Secondary | ICD-10-CM | POA: Diagnosis not present

## 2014-08-16 DIAGNOSIS — C84A0 Cutaneous T-cell lymphoma, unspecified, unspecified site: Secondary | ICD-10-CM

## 2014-08-16 DIAGNOSIS — E43 Unspecified severe protein-calorie malnutrition: Secondary | ICD-10-CM | POA: Diagnosis not present

## 2014-08-16 DIAGNOSIS — B961 Klebsiella pneumoniae [K. pneumoniae] as the cause of diseases classified elsewhere: Secondary | ICD-10-CM | POA: Insufficient documentation

## 2014-08-16 DIAGNOSIS — Z880 Allergy status to penicillin: Secondary | ICD-10-CM | POA: Diagnosis not present

## 2014-08-16 DIAGNOSIS — I2510 Atherosclerotic heart disease of native coronary artery without angina pectoris: Secondary | ICD-10-CM | POA: Diagnosis not present

## 2014-08-16 DIAGNOSIS — I5032 Chronic diastolic (congestive) heart failure: Secondary | ICD-10-CM | POA: Diagnosis present

## 2014-08-16 DIAGNOSIS — E876 Hypokalemia: Secondary | ICD-10-CM | POA: Diagnosis present

## 2014-08-16 DIAGNOSIS — D72829 Elevated white blood cell count, unspecified: Secondary | ICD-10-CM | POA: Diagnosis present

## 2014-08-16 DIAGNOSIS — I639 Cerebral infarction, unspecified: Secondary | ICD-10-CM | POA: Diagnosis present

## 2014-08-16 DIAGNOSIS — Z89619 Acquired absence of unspecified leg above knee: Secondary | ICD-10-CM

## 2014-08-16 DIAGNOSIS — Z881 Allergy status to other antibiotic agents status: Secondary | ICD-10-CM

## 2014-08-16 DIAGNOSIS — A419 Sepsis, unspecified organism: Principal | ICD-10-CM | POA: Diagnosis present

## 2014-08-16 DIAGNOSIS — J9600 Acute respiratory failure, unspecified whether with hypoxia or hypercapnia: Secondary | ICD-10-CM

## 2014-08-16 DIAGNOSIS — K219 Gastro-esophageal reflux disease without esophagitis: Secondary | ICD-10-CM | POA: Diagnosis not present

## 2014-08-16 DIAGNOSIS — Z66 Do not resuscitate: Secondary | ICD-10-CM | POA: Diagnosis not present

## 2014-08-16 DIAGNOSIS — J90 Pleural effusion, not elsewhere classified: Secondary | ICD-10-CM | POA: Insufficient documentation

## 2014-08-16 DIAGNOSIS — Z7901 Long term (current) use of anticoagulants: Secondary | ICD-10-CM

## 2014-08-16 DIAGNOSIS — R6520 Severe sepsis without septic shock: Secondary | ICD-10-CM | POA: Diagnosis not present

## 2014-08-16 DIAGNOSIS — E785 Hyperlipidemia, unspecified: Secondary | ICD-10-CM | POA: Diagnosis present

## 2014-08-16 DIAGNOSIS — I739 Peripheral vascular disease, unspecified: Secondary | ICD-10-CM | POA: Diagnosis present

## 2014-08-16 DIAGNOSIS — C8448 Peripheral T-cell lymphoma, not classified, lymph nodes of multiple sites: Secondary | ICD-10-CM | POA: Diagnosis present

## 2014-08-16 DIAGNOSIS — Z8673 Personal history of transient ischemic attack (TIA), and cerebral infarction without residual deficits: Secondary | ICD-10-CM

## 2014-08-16 DIAGNOSIS — I252 Old myocardial infarction: Secondary | ICD-10-CM | POA: Diagnosis not present

## 2014-08-16 DIAGNOSIS — N179 Acute kidney failure, unspecified: Secondary | ICD-10-CM | POA: Diagnosis not present

## 2014-08-16 DIAGNOSIS — C8590 Non-Hodgkin lymphoma, unspecified, unspecified site: Secondary | ICD-10-CM | POA: Diagnosis not present

## 2014-08-16 DIAGNOSIS — R532 Functional quadriplegia: Secondary | ICD-10-CM | POA: Diagnosis not present

## 2014-08-16 DIAGNOSIS — I480 Paroxysmal atrial fibrillation: Secondary | ICD-10-CM | POA: Diagnosis not present

## 2014-08-16 DIAGNOSIS — Z7189 Other specified counseling: Secondary | ICD-10-CM

## 2014-08-16 DIAGNOSIS — Z515 Encounter for palliative care: Secondary | ICD-10-CM

## 2014-08-16 DIAGNOSIS — R0600 Dyspnea, unspecified: Secondary | ICD-10-CM | POA: Insufficient documentation

## 2014-08-16 HISTORY — DX: Cutaneous T-cell lymphoma, unspecified, unspecified site: C84.A0

## 2014-08-16 LAB — COMPREHENSIVE METABOLIC PANEL
ALT: 11 U/L (ref 0–35)
AST: 20 U/L (ref 0–37)
Albumin: 3 g/dL — ABNORMAL LOW (ref 3.5–5.2)
Alkaline Phosphatase: 50 U/L (ref 39–117)
Anion gap: 15 (ref 5–15)
BUN: 36 mg/dL — ABNORMAL HIGH (ref 6–23)
CO2: 29 mEq/L (ref 19–32)
Calcium: 9.5 mg/dL (ref 8.4–10.5)
Chloride: 91 mEq/L — ABNORMAL LOW (ref 96–112)
Creatinine, Ser: 1.7 mg/dL — ABNORMAL HIGH (ref 0.50–1.10)
GFR calc Af Amer: 29 mL/min — ABNORMAL LOW (ref >90)
GFR calc non Af Amer: 25 mL/min — ABNORMAL LOW (ref >90)
Glucose, Bld: 148 mg/dL — ABNORMAL HIGH (ref 70–99)
Potassium: 3.7 mEq/L (ref 3.7–5.3)
Sodium: 135 mEq/L — ABNORMAL LOW (ref 137–147)
Total Bilirubin: 0.8 mg/dL (ref 0.3–1.2)
Total Protein: 6.9 g/dL (ref 6.0–8.3)

## 2014-08-16 LAB — CBC WITH DIFFERENTIAL/PLATELET
Basophils Absolute: 0 10*3/uL (ref 0.0–0.1)
Basophils Relative: 0 % (ref 0–1)
Eosinophils Absolute: 0 10*3/uL (ref 0.0–0.7)
Eosinophils Relative: 0 % (ref 0–5)
HCT: 32.8 % — ABNORMAL LOW (ref 36.0–46.0)
Hemoglobin: 11 g/dL — ABNORMAL LOW (ref 12.0–15.0)
Lymphocytes Relative: 23 % (ref 12–46)
Lymphs Abs: 3.7 10*3/uL (ref 0.7–4.0)
MCH: 30.9 pg (ref 26.0–34.0)
MCHC: 33.5 g/dL (ref 30.0–36.0)
MCV: 92.1 fL (ref 78.0–100.0)
Monocytes Absolute: 1.8 10*3/uL — ABNORMAL HIGH (ref 0.1–1.0)
Monocytes Relative: 11 % (ref 3–12)
Neutro Abs: 10.5 10*3/uL — ABNORMAL HIGH (ref 1.7–7.7)
Neutrophils Relative %: 66 % (ref 43–77)
Platelets: 201 10*3/uL (ref 150–400)
RBC: 3.56 MIL/uL — ABNORMAL LOW (ref 3.87–5.11)
RDW: 13.4 % (ref 11.5–15.5)
WBC: 16 10*3/uL — ABNORMAL HIGH (ref 4.0–10.5)

## 2014-08-16 LAB — BLOOD GAS, ARTERIAL
Acid-base deficit: 1.5 mmol/L (ref 0.0–2.0)
Bicarbonate: 25.2 mEq/L — ABNORMAL HIGH (ref 20.0–24.0)
Drawn by: 257701
O2 Content: 2 L/min
O2 Saturation: 92.3 %
PATIENT TEMPERATURE: 98.6
TCO2: 23.7 mmol/L (ref 0–100)
pCO2 arterial: 54.9 mmHg — ABNORMAL HIGH (ref 35.0–45.0)
pH, Arterial: 7.284 — ABNORMAL LOW (ref 7.350–7.450)
pO2, Arterial: 72.1 mmHg — ABNORMAL LOW (ref 80.0–100.0)

## 2014-08-16 LAB — PRO B NATRIURETIC PEPTIDE: Pro B Natriuretic peptide (BNP): 19573 pg/mL — ABNORMAL HIGH (ref 0–450)

## 2014-08-16 LAB — URINE MICROSCOPIC-ADD ON

## 2014-08-16 LAB — STREP PNEUMONIAE URINARY ANTIGEN: Strep Pneumo Urinary Antigen: NEGATIVE

## 2014-08-16 LAB — MRSA PCR SCREENING: MRSA BY PCR: NEGATIVE

## 2014-08-16 LAB — URINALYSIS, ROUTINE W REFLEX MICROSCOPIC
BILIRUBIN URINE: NEGATIVE
Glucose, UA: NEGATIVE mg/dL
HGB URINE DIPSTICK: NEGATIVE
Ketones, ur: NEGATIVE mg/dL
Nitrite: NEGATIVE
Protein, ur: 30 mg/dL — AB
SPECIFIC GRAVITY, URINE: 1.02 (ref 1.005–1.030)
UROBILINOGEN UA: 0.2 mg/dL (ref 0.0–1.0)
pH: 5 (ref 5.0–8.0)

## 2014-08-16 LAB — INFLUENZA PANEL BY PCR (TYPE A & B)
H1N1FLUPCR: NOT DETECTED
INFLAPCR: NEGATIVE
INFLBPCR: NEGATIVE

## 2014-08-16 LAB — LACTIC ACID, PLASMA: LACTIC ACID, VENOUS: 1.8 mmol/L (ref 0.5–2.2)

## 2014-08-16 LAB — CG4 I-STAT (LACTIC ACID): Lactic Acid, Venous: 2.72 mmol/L — ABNORMAL HIGH (ref 0.5–2.2)

## 2014-08-16 MED ORDER — CARBOXYMETHYLCELLULOSE SODIUM 1 % OP SOLN: 1.0000 [drp] | Freq: Two times a day (BID) | OPHTHALMIC | Status: DC

## 2014-08-16 MED ORDER — POLYETHYLENE GLYCOL 3350 17 G PO PACK
17.0000 g | PACK | ORAL | Status: DC
  Administered 2014-08-18 – 2014-08-26 (×4): 17 g via ORAL
  Filled 2014-08-16 (×6): qty 1

## 2014-08-16 MED ORDER — DEXTROSE 5 % IV SOLN: 2.0000 g | Freq: Once | INTRAVENOUS | Status: DC

## 2014-08-16 MED ORDER — SODIUM CHLORIDE 0.9 % IV BOLUS (SEPSIS)
1000.0000 mL | Freq: Once | INTRAVENOUS | Status: AC
  Administered 2014-08-16: 1000 mL via INTRAVENOUS

## 2014-08-16 MED ORDER — IPRATROPIUM-ALBUTEROL 0.5-2.5 (3) MG/3ML IN SOLN
3.0000 mL | Freq: Four times a day (QID) | RESPIRATORY_TRACT | Status: DC | PRN
  Administered 2014-08-16: 3 mL via RESPIRATORY_TRACT

## 2014-08-16 MED ORDER — DABIGATRAN ETEXILATE MESYLATE 75 MG PO CAPS: 75.0000 mg | ORAL_CAPSULE | Freq: Two times a day (BID) | ORAL | Status: DC

## 2014-08-16 MED ORDER — SODIUM CHLORIDE 0.9 % IV SOLN
INTRAVENOUS | Status: DC
  Administered 2014-08-16: 23:00:00 via INTRAVENOUS

## 2014-08-16 MED ORDER — ONDANSETRON HCL 4 MG/2ML IJ SOLN: 4.0000 mg | Freq: Four times a day (QID) | INTRAMUSCULAR | Status: DC | PRN

## 2014-08-16 MED ORDER — SODIUM CHLORIDE 0.9 % IV SOLN: INTRAVENOUS | Status: DC

## 2014-08-16 MED ORDER — CARVEDILOL 12.5 MG PO TABS: 12.5000 mg | ORAL_TABLET | Freq: Two times a day (BID) | ORAL | Status: DC

## 2014-08-16 MED ORDER — ENOXAPARIN SODIUM 60 MG/0.6ML ~~LOC~~ SOLN
60.0000 mg | SUBCUTANEOUS | Status: DC
  Administered 2014-08-17 – 2014-08-18 (×2): 60 mg via SUBCUTANEOUS
  Filled 2014-08-16 (×3): qty 0.6

## 2014-08-16 MED ORDER — FOLIC ACID 1 MG PO TABS
1.0000 mg | ORAL_TABLET | Freq: Every day | ORAL | Status: DC
  Administered 2014-08-17 – 2014-08-27 (×9): 1 mg via ORAL
  Filled 2014-08-16 (×9): qty 1

## 2014-08-16 MED ORDER — SODIUM CHLORIDE 0.9 % IV BOLUS (SEPSIS)
500.0000 mL | Freq: Once | INTRAVENOUS | Status: AC
  Administered 2014-08-16: 500 mL via INTRAVENOUS

## 2014-08-16 MED ORDER — BEXAROTENE 75 MG PO CAPS: 300.0000 mg | ORAL_CAPSULE | Freq: Every day | ORAL | Status: DC

## 2014-08-16 MED ORDER — TRIAMCINOLONE ACETONIDE 0.1 % EX CREA
TOPICAL_CREAM | CUTANEOUS | Status: DC
  Administered 2014-08-17 – 2014-08-20 (×3): via TOPICAL

## 2014-08-16 MED ORDER — SODIUM CHLORIDE 0.9 % IV SOLN
INTRAVENOUS | Status: DC
  Administered 2014-08-16: 12:00:00 via INTRAVENOUS

## 2014-08-16 MED ORDER — GUAIFENESIN ER 600 MG PO TB12
600.0000 mg | ORAL_TABLET | Freq: Two times a day (BID) | ORAL | Status: DC
Start: 2014-08-16 — End: 2014-08-16
  Filled 2014-08-16 (×2): qty 1

## 2014-08-16 MED ORDER — MORPHINE SULFATE 2 MG/ML IJ SOLN
1.0000 mg | INTRAMUSCULAR | Status: DC | PRN
  Administered 2014-08-17: 1 mg via INTRAVENOUS
  Administered 2014-08-18 (×2): 2 mg via INTRAVENOUS
  Filled 2014-08-16 (×3): qty 1

## 2014-08-16 MED ORDER — GABAPENTIN 100 MG PO CAPS
100.0000 mg | ORAL_CAPSULE | Freq: Every day | ORAL | Status: DC
  Filled 2014-08-16: qty 1

## 2014-08-16 MED ORDER — DILTIAZEM HCL ER COATED BEADS 120 MG PO CP24: 120.0000 mg | ORAL_CAPSULE | Freq: Every day | ORAL | Status: DC

## 2014-08-16 MED ORDER — POLYVINYL ALCOHOL 1.4 % OP SOLN
1.0000 [drp] | Freq: Two times a day (BID) | OPHTHALMIC | Status: DC
  Administered 2014-08-17 – 2014-08-27 (×19): 1 [drp] via OPHTHALMIC
  Filled 2014-08-16 (×2): qty 15

## 2014-08-16 MED ORDER — CEFEPIME HCL 1 G IJ SOLR
1.0000 g | INTRAMUSCULAR | Status: DC
  Administered 2014-08-17 – 2014-08-20 (×4): 1 g via INTRAVENOUS
  Filled 2014-08-16 (×4): qty 1

## 2014-08-16 MED ORDER — ATORVASTATIN CALCIUM 40 MG PO TABS
40.0000 mg | ORAL_TABLET | Freq: Every day | ORAL | Status: DC
  Filled 2014-08-16: qty 1

## 2014-08-16 MED ORDER — ACETAMINOPHEN 325 MG PO TABS
650.0000 mg | ORAL_TABLET | Freq: Once | ORAL | Status: AC
  Administered 2014-08-16: 650 mg via ORAL

## 2014-08-16 MED ORDER — DEXTROSE 5 % IV SOLN
2.0000 g | INTRAVENOUS | Status: AC
  Administered 2014-08-16: 2 g via INTRAVENOUS

## 2014-08-16 MED ORDER — HYDROCODONE-ACETAMINOPHEN 5-325 MG PO TABS: 1.0000 | ORAL_TABLET | Freq: Four times a day (QID) | ORAL | Status: DC | PRN

## 2014-08-16 MED ORDER — SODIUM CHLORIDE 0.9 % IV BOLUS (SEPSIS)
250.0000 mL | Freq: Once | INTRAVENOUS | Status: AC
Start: 2014-08-16 — End: 2014-08-16
  Administered 2014-08-16: 250 mL via INTRAVENOUS

## 2014-08-16 MED ORDER — VANCOMYCIN HCL IN DEXTROSE 1-5 GM/200ML-% IV SOLN
1000.0000 mg | Freq: Once | INTRAVENOUS | Status: AC
  Administered 2014-08-16: 1000 mg via INTRAVENOUS

## 2014-08-16 MED ORDER — ISOSORBIDE MONONITRATE ER 30 MG PO TB24: 30.0000 mg | ORAL_TABLET | Freq: Every day | ORAL | Status: DC

## 2014-08-16 MED ORDER — SODIUM CHLORIDE 0.9 % IJ SOLN
3.0000 mL | Freq: Two times a day (BID) | INTRAMUSCULAR | Status: DC
  Administered 2014-08-17 – 2014-08-27 (×16): 3 mL via INTRAVENOUS

## 2014-08-16 MED ORDER — VANCOMYCIN HCL IN DEXTROSE 1-5 GM/200ML-% IV SOLN
1000.0000 mg | INTRAVENOUS | Status: DC
  Administered 2014-08-18: 1000 mg via INTRAVENOUS
  Filled 2014-08-16: qty 200

## 2014-08-16 MED ORDER — ONDANSETRON HCL 4 MG PO TABS: 4.0000 mg | ORAL_TABLET | Freq: Four times a day (QID) | ORAL | Status: DC | PRN

## 2014-08-16 MED ORDER — SODIUM CHLORIDE 0.9 % IV SOLN
INTRAVENOUS | Status: DC
  Administered 2014-08-16: 15:00:00 via INTRAVENOUS

## 2014-08-16 MED ORDER — FAMOTIDINE 20 MG PO TABS
40.0000 mg | ORAL_TABLET | Freq: Every day | ORAL | Status: DC
  Administered 2014-08-17 – 2014-08-18 (×2): 40 mg via ORAL
  Filled 2014-08-16: qty 1
  Filled 2014-08-16: qty 2

## 2014-08-16 MED ORDER — OMEGA-3-ACID ETHYL ESTERS 1 G PO CAPS
1.0000 g | ORAL_CAPSULE | Freq: Every day | ORAL | Status: DC
  Administered 2014-08-17 – 2014-08-27 (×10): 1 g via ORAL
  Filled 2014-08-16 (×12): qty 1

## 2014-08-16 MED ORDER — CETYLPYRIDINIUM CHLORIDE 0.05 % MT LIQD
7.0000 mL | Freq: Two times a day (BID) | OROMUCOSAL | Status: DC
  Administered 2014-08-16 – 2014-08-27 (×18): 7 mL via OROMUCOSAL

## 2014-08-16 MED ORDER — LEVOCETIRIZINE DIHYDROCHLORIDE 5 MG PO TABS
5.0000 mg | ORAL_TABLET | Freq: Every day | ORAL | Status: DC | PRN
Start: 2014-08-16 — End: 2014-08-16

## 2014-08-16 MED ORDER — LORATADINE 10 MG PO TABS
10.0000 mg | ORAL_TABLET | Freq: Every day | ORAL | Status: DC | PRN
  Filled 2014-08-16: qty 1

## 2014-08-16 NOTE — Progress Notes (Signed)
CRITICAL VALUE ALERT  Critical value received:  All four bottles (anerobic/aerobic)  Positive for gram negative rods   Date of notification:  12/4  Time of notification:  2340  Critical value read back:Yes.    Nurse who received alert: Darrin Nipper, RN  MD notified (1st page):  ELINK   Time of first page:  2340

## 2014-08-16 NOTE — Progress Notes (Signed)
eLink Physician-Brief Progress Note Patient Name: Sara [name] DOB: [birth year] MRN: [id number]   Date of Service  08/16/2014  HPI/Events of Note  NHR, adm with RUl pneumonia & AKI -severe sepsis, tr to ICU for hypotension, some response to fluids, concernf or BL crackles now & fluid overload, ABG - resp acidosis  eICU Interventions  Bipap chk Lactate for clearance Will accept lower BP, if so Limited code noted     Intervention Category Major Interventions: Sepsis - evaluation and management  ALVA,RAKESH V. 08/16/2014, 9:13 PM

## 2014-08-16 NOTE — Progress Notes (Signed)
eLink Physician-Brief Progress Note Patient Name: Sara [name] DOB: [birth year] MRN: [id number]   Date of Service  08/16/2014  HPI/Events of Note  Low BP - CXR shows worsening pna -no edema  eICU Interventions  250 NS bolus     Intervention Category Intermediate Interventions: Hypotension - evaluation and management  [name] V. 08/16/2014, 10:20 PM

## 2014-08-16 NOTE — ED Notes (Signed)
Code sepsis level ll called by dr Wilson Singer  carelink called.Marland Kitchenklj

## 2014-08-16 NOTE — ED Notes (Signed)
Ed nurse notified of

## 2014-08-16 NOTE — ED Notes (Addendum)
Ed nurse notified of ready bed and Ron Sarita Haver notified of 20  min till arrival...klj

## 2014-08-16 NOTE — Progress Notes (Signed)
Assumed care of patient at 44 from [place], [place]. Pt's SBP in the 70s, HR in 70s-80s and patient had increased work of breathing, was satting 93-95% on 3 L N/C. MD made aware and 1 L bolus ordered. Respiratory therapy assessed patient and administered breathing treatment. Rapid response RN to floor, ABG ordered and MD made aware of situation. After first bolus completed, patient's SBP was in the 60s. Dr. Charlies Silvers again made aware, said to order 500 mL bolus if pressure not increased in 30 minutes. Patient's SBP still in the 60s at that time, so second bolus administered. [facility] on call made aware of patient's status. Night RN to assume care of patient and monitor closely.

## 2014-08-16 NOTE — ED Notes (Signed)
Per [facility]- Pt resides at [facility] DNR/YELLOW COPY PRESENT. Per staff fever today unknown and "coughing up blood". Pt c/o of not feeling well starting this morning. Per [facility] 02 sats 86RA 4lNC94%. RR 25. Denies N/V/D.

## 2014-08-16 NOTE — Progress Notes (Signed)
Subjective/Objective 80 year old female with past medical history of PAD status post stenting (LICA 09/7508), right AKA, paroxysmal atrial fibrillation on pradaxa,dementia, hypertension, chronic diastolic CHF (2 D ECHO in 08/2014 with EF of 55%, wheelchair bound who presented from [place] to [facility] ED with CXR consistent with right upper lung lobe pneumonia. She was started on broad spectrum abx, cefepime and vancomycin for sepsis and pneumonia.   Later this evening LOC declined and patient became hypotensive and hypoxic. Received 2.5L NS fluid bolus to date with mild improvement in BP.   Scheduled Meds: . antiseptic oral rinse  7 mL Mouth Rinse BID  . atorvastatin  40 mg Oral q1800  . [START ON 08/17/2014] ceFEPime (MAXIPIME) IV  1 g Intravenous Q24H  . [START ON 08/17/2014] enoxaparin (LOVENOX) injection  60 mg Subcutaneous Q24H  . famotidine  40 mg Oral Daily  . [START ON 25/04/5276] folic acid  1 mg Oral Q breakfast  . gabapentin  100 mg Oral QHS  . guaiFENesin  600 mg Oral BID  . omega-3 acid ethyl esters  1 g Oral Daily  . polyethylene glycol  17 g Oral QODAY  . polyvinyl alcohol  1 drop Both Eyes BID  . sodium chloride  3 mL Intravenous Q12H  . triamcinolone cream   Topical UD  . [START ON 08/18/2014] vancomycin  1,000 mg Intravenous Q48H   Continuous Infusions: . sodium chloride 50 mL/hr at 08/16/14 1824   PRN Meds:HYDROcodone-acetaminophen, ipratropium-albuterol, loratadine, morphine injection, ondansetron **OR** ondansetron (ZOFRAN) IV  Vital signs in last 24 hours: Temp:  [97.7 F (36.5 C)-102 F (38.9 C)] 97.7 F (36.5 C) (12/04 1352) Pulse Rate:  [59-91] 75 (12/04 2023) Resp:  [17-25] 18 (12/04 1352) BP: (63-141)/(33-65) 63/42 mmHg (12/04 2023) SpO2:  [86 %-100 %] 96 % (12/04 2010) Weight:  [62.097 kg (136 lb 14.4 oz)] 62.097 kg (136 lb 14.4 oz) (12/04 1352)  Intake/Output last 3 shifts: I/O last 3 completed shifts: In: 2232.5 [P.O.:100; I.V.:1132.5; IV  Piggyback:1000] Out: -  Intake/Output this shift: Total I/O In: 500 [IV Piggyback:500] Out: -    Physical exam: Physical Examination: General appearance - ill-appearing, in respiratory distress Mental status - lethargic, responds to commands Chest - rales noted bilaterally Heart - normal rate and regular rhythm Extremities - no pedal edema noted   Problem Assessment/Plan:   Sepsis: Transfer to ICU. Check lactic acid - currently continue IVF at 50 cc/hr. Continue current abx.  HCAP: see above. Continuing Cefepime/Vanc. Influenza swab negative. Place on Bipap.  Ethics: Code status changed by daughter from DNR to Limited Code - wants Bipap, pressors, ACLS drugs and defibrillation/cardioversion if needed/warrented. No CPR and no intubation.

## 2014-08-16 NOTE — Progress Notes (Signed)
ANTIBIOTIC CONSULT NOTE - INITIAL  Pharmacy Consult for vancomycin, cefepime Indication: rule out sepsis  Allergies  Allergen Reactions  . Evista [Raloxifene]     unknown  . Penicillins Other (See Comments)    unknown  . Triamterene Other (See Comments)    unknown  . Alendronate Sodium Rash    Patient Measurements:   Used previous weight of 64kg IBW 59kg for Ht 66in  Vital Signs: Temp: 102 F (38.9 C) (12/04 0955) Temp Source: Rectal (12/04 0955) BP: 141/49 mmHg (12/04 1130) Pulse Rate: 77 (12/04 1130) Intake/Output from previous day:   Intake/Output from this shift: Total I/O In: 1000 [I.V.:1000] Out: -   Labs:  Recent Labs  08/16/14 1020 08/16/14 1124  WBC  --  16.0*  HGB  --  11.0*  PLT  --  201  CREATININE 1.70*  --    CrCl cannot be calculated (Unknown ideal weight.). No results for input(s): VANCOTROUGH, VANCOPEAK, VANCORANDOM, GENTTROUGH, GENTPEAK, GENTRANDOM, TOBRATROUGH, TOBRAPEAK, TOBRARND, AMIKACINPEAK, AMIKACINTROU, AMIKACIN in the last 72 hours.   Microbiology: Recent Results (from the past 720 hour(s))  Urine culture     Status: None   Collection Time: 08/03/14 12:10 AM  Result Value Ref Range Status   Specimen Description URINE, RANDOM  Final   Special Requests NONE  Final   Culture  Setup Time   Final    08/03/2014 14:56 Performed at [facility]   Final    50,000 COLONIES/ML Performed at [facility]    Culture   Final    KLEBSIELLA PNEUMONIAE Performed at [facility]    Report Status 08/06/2014 FINAL  Final   Organism ID, Bacteria KLEBSIELLA PNEUMONIAE  Final      Susceptibility   Klebsiella pneumoniae - MIC*    AMPICILLIN >=32 RESISTANT Resistant     CEFAZOLIN <=4 SENSITIVE Sensitive     CEFTRIAXONE <=1 SENSITIVE Sensitive     CIPROFLOXACIN <=0.25 SENSITIVE Sensitive     GENTAMICIN <=1 SENSITIVE Sensitive     LEVOFLOXACIN <=0.12 SENSITIVE Sensitive     NITROFURANTOIN 64  INTERMEDIATE Intermediate     TOBRAMYCIN <=1 SENSITIVE Sensitive     TRIMETH/SULFA <=20 SENSITIVE Sensitive     PIP/TAZO <=4 SENSITIVE Sensitive     * KLEBSIELLA PNEUMONIAE    Medical History: Past Medical History  Diagnosis Date  . Hypertension   . Atrial fibrillation   . Psoriasis   . Congestive heart failure (CHF)   . History of CVA (cerebrovascular accident)   . Coronary artery disease   . Myocardial infarction   . CHF (congestive heart failure)   . Shortness of breath   . H/O hiatal hernia   . Closed fracture of head of left humerus 5/12  . Basal cell carcinoma   . 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  Flow cytometry 04/04/12: 91% cells CD4 positive CD 26 negative  . Hyperlipemia   . Pneumonia   . Wound of right leg 09/25/2012    Necrotic, open wound right lower leg; non healing  . Stroke   . Hyposmolality and/or hyponatremia   . Lower limb amputation, above knee 10/11/2012  . Pain in joint, multiple sites   . Mycosis fungoides, unspecified site, extranodal and solid organ sites 09/28/2012  . Occlusion and stenosis of carotid artery without mention of cerebral  infarction 09/28/2012  . Chronic venous hypertension with ulcer and inflammation 09/28/2012  . Diverticulosis of colon (without mention of hemorrhage) 09/28/2012  . Synovial cyst of popliteal space 03/26/2012  . Unspecified constipation 03/24/2012  . Hypopotassemia 01/26/2012  . Debility, unspecified 01/17/2012  . Anxiety state, unspecified 12/31/2011  . Anemia, unspecified 12/21/2011  . Other specified disease of white blood cells 12/21/2011  . Peripheral vascular disease, unspecified 12/21/2011    recent ABI Lt of 0.64 down from ).80 on 12/29/11- though different labs  . Reflux esophagitis 12/21/2011  . Unspecified hereditary and idiopathic peripheral neuropathy 12/14/2011   . Pain in joint, ankle and foot 12/14/2011  . Carotid stenosis     CAROTID DOPPLER, 05/02/2012 - LEFT VERTEBRAL-occluded, LEFT BULB AND PROXIMAL ICA STENT-moderate amount of irregular mixed dense plaque 50-69% diameter reduction  . Pre-syncope     NUCLEAR STRESS TEST, 12/10/2009 - normal, EKG negative for ischemia  . TIA (transient ischemic attack)     2D ECHO, 12/05/2012 - EF 60-65%, Severely calcified annulus of the mitral valve with mild-moderate regurgitation, LA moderate-severely dilated  . Paroxysmal atrial fibrillation   . CAD in native artery     3 vessel CAD, medical therapy, not a candidate for CABG  . S/P AKA (above knee amputation) unilateral, Rt leg due to PAD and non healing wound 06/27/2013  . HCAP (healthcare-associated pneumonia) 01/08/2012  . 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 sodium chloride 100 mL/hr at 08/16/14 1209   Assessment: 80 yo female from SNF presents to ER with fever, hemoptysis, and hypotension. Patient with hx chronic diastolic CHF, s/p right AKA, peripheral T cell lymphoma, PAF, HTN, GERD. To start cefepime and vancomycin per pharmacy for rule out sepsis. Note unknown allergy to PCN but patient has tolerated cefepime in past per records  12/4 >> vanc >> 12/4 >> cefepime >>  Tmax: 102 WBCs: 16 Renal: Scr 1.7, est CrCl 19 ml/min  12/4 blood: 12/4 urine:  12/4 sputum:  08/03/14 urine: klebsiella - resistant only to amp and intermediate to nitro   Goal of Therapy:  Vancomycin trough level 15-20 mcg/ml  Plan:  1) Vanc 1g IV q48 (first dose given 12/4 at 1045) per current renal function and weight 2) Cefepime 2g IV x 1 given at 1209. Start 1g IV q24 beginning tomorrow   Adrian Saran, PharmD, BCPS Pager [phone] 08/16/2014 1:07 PM

## 2014-08-16 NOTE — Progress Notes (Signed)
Pt. Blood pressure 71/42.  MD made aware- order given to run a .0% NS liter bolus at 500 cc/hr.  Blood pressure up 90/42.  Pt has not voided since admission, bladder scan done and showing 180 cc's in bladder.  Dr Charlies Silvers made aware.  Will continue to check vitals frequently and monitor closely.

## 2014-08-16 NOTE — H&P (Addendum)
[facility] History and Physical  Sara [name] PPJ:[id number] DOB: 04/14/1919 DOA: 08/16/2014  Referring physician: ER physician PCP: Estill Dooms, MD   Chief Complaint:   [id number]  80 year old female with past medical history of PAD status  post stenting (LICA 58/0998), right AKA, paroxysmal atrial fibrillation on AC with pradaxa,dementia, hypertension, chronic diastolic CHF (2 D ECHO in 08/2014 with EF of 55%, wheelchair bound who presented from [place] to [facility] ED with reports of not feeling very well and reports of fevers. She is not a very good historian due to dementia. Apparently no respiratory distress but on admission patient was found to have O2 saturation of 86%. No other complaints. In ED, BP was 71/42 to 141/49, HR 59-91, RR 25 and T max 102 F. Oxygen saturation was 86% on room air, improved to 92% with [place] oxygen support. CXR consistent with right upper lung lobe pneumonia. She was started on broad spectrum abx, cefepime and vanco for sepsis and pneumonia. She is DNR and stable with current BP of 141/49 to go to telemetry floor.    Assessment & Plan    Principal Problem: Severe sepsis /  Leukocytosis / HCAP  - sepsis criteria met with initial vitals hypotension, tachypnea, bradycardia, T max 102 F, evidence of end organ damange which includes hypoxia and acute renal failure. Additionally, WBC count 16 and evidence of pneumonia on CXR. - started broad spectrum antibiotics - follow up blood cultures, resp culture, legionella and strep pneumonia results - pt DNR, qualifies for telemetry at this time due to stable hemodynamics, BP 141/49. - continue supportive care with IV fluids - nebulizer treatments as needed for shortness of breath   Active Problems: Chronic diastolic HF (heart failure) - last BNP in 12/2013 with BNP in 300-400 range. 2 D ECHO in 08/2013 showed EF 55%. - seems compensated; respiratory hypoxia likely due to CAP, sepsis HTN (hypertension) - all  antihypertensives on hold due to hypotension  PAF (paroxysmal atrial fibrillation) /  Chronic anticoagulation- Pradaxa - on pradaxa but will temporarily put on hold due to worsening renal failure. I spoke with pharmacy in regards to alternative and we have switched to Lovenox sub Q for now  S/P AKA (above knee amputation) unilateral, Rt leg due to PAD and non healing wound / PAD (peripheral artery disease)- LICA stent 3/38 - stable - continue gabapentin  Stroke, acute, Rt brain, embolic March 2505 - pt is on pradaxa but temporarily put on hold due to worsening renal failure. 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 Right upper lobe pneumonia. Post treatment radiographs recommended to document resolution. 2. No pleural effusion identified.     EKG: pending   Code Status: DNR/DNI  Family Communication: Plan of care discussed with the patient  Disposition Plan: Admit for further evaluation  [name], MD  [facility] Pager [phone]  Review of Systems:  Constitutional: positive for fever, chills and malaise/fatigue. Negative for diaphoresis.  HENT: Negative for hearing loss, ear pain, nosebleeds, congestion, sore throat,  neck pain, tinnitus and ear discharge.   Eyes: Negative for blurred vision, double vision, photophobia, pain, discharge and redness.  Respiratory: Negative for cough, hemoptysis, sputum production, shortness of breath, wheezing and stridor.   Cardiovascular: Negative for chest pain, palpitations, orthopnea, claudication and leg swelling.  Gastrointestinal: Negative for nausea, vomiting and abdominal pain. Negative for heartburn, constipation, blood in stool and melena.  Genitourinary: Negative for dysuria, urgency, frequency, hematuria and flank pain.  Musculoskeletal: Negative for myalgias, back pain, joint pain and falls.  Skin: Negative for itching and rash.  Neurological: Negative for dizziness and weakness. Negative for tingling, tremors, sensory change, speech change, focal weakness, loss of consciousness and headaches.  Endo/Heme/Allergies: Negative for environmental allergies and polydipsia. Does not bruise/bleed easily.  Psychiatric/Behavioral: Negative for suicidal ideas. The patient is not nervous/anxious.      Past Medical History  Diagnosis Date  . Hypertension   . Atrial fibrillation   . Psoriasis   . Congestive heart failure (CHF)   . History of CVA (cerebrovascular accident)   . Coronary artery disease   . Myocardial infarction   . CHF (congestive heart failure)   . Shortness of breath   . H/O hiatal hernia   . Closed fracture of head of left humerus 5/12  . Basal cell carcinoma   . 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  Flow cytometry 04/04/12: 91% cells CD4 positive CD 26 negative  . Hyperlipemia   . Pneumonia   . Wound of right leg 09/25/2012    Necrotic, open wound right lower leg; non healing  . Stroke   . Hyposmolality and/or hyponatremia   . Lower limb amputation, above knee 10/11/2012  . Pain in joint, multiple  sites   . Mycosis fungoides, unspecified site, extranodal and solid organ sites 09/28/2012  . Occlusion and stenosis of carotid artery without mention of cerebral infarction 09/28/2012  . Chronic venous hypertension with ulcer and inflammation 09/28/2012  . Diverticulosis of colon (without mention of hemorrhage) 09/28/2012  . Synovial cyst of popliteal space 03/26/2012  . Unspecified constipation 03/24/2012  . Hypopotassemia 01/26/2012  . Debility, unspecified 01/17/2012  . Anxiety state, unspecified 12/31/2011  . Anemia, unspecified 12/21/2011  . Other specified disease of white blood cells 12/21/2011  . Peripheral vascular disease, unspecified 12/21/2011    recent ABI Lt of 0.64 down from ).80 on 12/29/11- though different labs  . Reflux esophagitis 12/21/2011  . Unspecified hereditary and idiopathic peripheral neuropathy 12/14/2011  . Pain in joint, ankle and foot 12/14/2011  . Carotid stenosis     CAROTID DOPPLER, 05/02/2012 - LEFT VERTEBRAL-occluded, LEFT BULB AND PROXIMAL ICA STENT-moderate amount of irregular mixed dense plaque 50-69% diameter reduction  . Pre-syncope     NUCLEAR STRESS TEST, 12/10/2009 - normal, EKG negative for ischemia  . TIA (transient ischemic attack)     2D ECHO, 12/05/2012 - EF 60-65%, Severely calcified annulus of the mitral valve with mild-moderate regurgitation, LA moderate-severely dilated  . Paroxysmal atrial fibrillation   . CAD in native artery     3  vessel CAD, medical therapy, not a candidate for CABG  . S/P AKA (above knee amputation) unilateral, Rt leg due to PAD and non healing wound 06/27/2013  . HCAP (healthcare-associated pneumonia) 01/08/2012  . 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 Tonsillectomy    . Dilation and curettage of uterus    . Eye surgery    . Cataracts    . Vertebroplasty    . Amputation  10/10/2012    Procedure: AMPUTATION ABOVE KNEE;  Surgeon:  Newt Minion, MD;  Location: [facility];  Service: Orthopedics;  Laterality: Right;  . Percutaneous stent intervention  04/13/2011    Left internal carotid artery stented with a 8x30 prcise nitinol self-expanding stent resulting reduction of 90% to less than 20% residual  . Cardiac catheterization  11/06/2010    CABG recommended, turned down by [facility] secondary to co morbidities   Social History:  reports that she has never smoked. She has never used smokeless tobacco. She reports that she does not drink alcohol or use illicit drugs.  Allergies  Allergen Reactions  . Evista [Raloxifene]     unknown  . Penicillins Other (See Comments)    unknown  . Triamterene Other (See Comments)    unknown  . Alendronate Sodium Rash    Family History:  Family History  Problem Relation Age of Onset  . Stomach cancer Mother   . Pneumonia Father   . Cirrhosis Brother   . Diabetes Brother   . Heart attack Paternal Grandmother   . Heart failure Sister   . Diabetes Sister   . Heart failure Sister      Prior to Admission medications   Medication Sig Start Date End Date Taking? Authorizing Provider  acetaminophen (TYLENOL) 325 MG tablet Take 650 mg by mouth once.   Yes Historical Provider, MD  atorvastatin (LIPITOR) 40 MG tablet Take 1 tablet (40 mg total) by mouth daily at 6 PM. 12/05/12  Yes Nita Sells, MD  bexarotene (TARGRETIN) 75 MG CAPS capsule Take 4 capsules (300 mg total) by mouth daily before supper. Give with food. Protect from light. CAUTION: Chemotherapy/Biotherapy-Wear gloves when handling 06/04/14 09/04/14 Yes Heath Lark, MD  carvedilol (COREG) 12.5 MG tablet Take 1 tablet (12.5 mg total) by mouth 2 (two) times daily with a meal. 08/30/13  Yes Janece Canterbury, MD  dabigatran (PRADAXA) 75 MG CAPS capsule Take 75 mg by mouth every 12 (twelve) hours.   Yes Historical Provider, MD  diltiazem (CARDIZEM CD) 120 MG 24 hr capsule Take 120 mg by mouth daily with breakfast.    Yes Historical  Provider, MD  famotidine (PEPCID) 40 MG tablet Take 40 mg by mouth daily. At 4pm.   Yes Historical Provider, MD  folic acid (FOLVITE) 1 MG tablet Take 1 mg by mouth daily with breakfast.    Yes Historical Provider, MD  furosemide (LASIX) 40 MG tablet Take 1.5 tablets (60 mg total) by mouth 2 (two) times daily. 08/30/13  Yes Janece Canterbury, MD  furosemide (LASIX) 40 MG tablet Take 40 mg by mouth daily as needed for fluid. 1 tab daily as needed for weight gain of 2-3 lbs in 24 hours or 3-5lbs in 1 week.   Yes Historical Provider, MD  gabapentin (NEURONTIN) 100 MG capsule Take 1 capsule (100 mg total) by mouth at bedtime. 12/05/12  Yes Nita Sells, MD  guaiFENesin (MUCINEX) 600 MG 12 hr tablet Take 600  mg by mouth 2 (two) times daily.    Yes Historical Provider, MD  HYDROcodone-acetaminophen (NORCO/VICODIN) 5-325 MG per tablet Take 1 tablet by mouth every 6 (six) hours as needed for moderate pain.   Yes Historical Provider, MD  ipratropium-albuterol (DUONEB) 0.5-2.5 (3) MG/3ML SOLN Take 3 mLs by nebulization every 6 (six) hours as needed (for shortness of breath and wheezing).    Yes Historical Provider, MD  isosorbide mononitrate (IMDUR) 30 MG 24 hr tablet Take 1 tablet (30 mg total) by mouth daily. Patient taking differently: Take 30 mg by mouth daily with breakfast.  06/27/13  Yes Isaiah Serge, NP  levocetirizine (XYZAL) 5 MG tablet Take 5 mg by mouth daily as needed for allergies.    Yes Historical Provider, MD  lisinopril (PRINIVIL,ZESTRIL) 5 MG tablet Take 5 mg by mouth daily with breakfast.    Yes Historical Provider, MD  nitroGLYCERIN (NITROSTAT) 0.4 MG SL tablet Place 0.4 mg under the tongue every 5 (five) minutes as needed for chest pain. If no relief call MD.   Yes Historical Provider, MD  Omega-3 Fatty Acids (FISH OIL) 1000 MG CAPS Take 1,000 mg by mouth at bedtime.    Yes Historical Provider, MD  polyethylene glycol (MIRALAX / GLYCOLAX) packet Take 17 g by mouth every other day.  Take 1 packet mixed in 4 oz of beverage every day to relieve constipation.   Yes Historical Provider, MD  Polyvinyl Alcohol-Povidone (REFRESH OP) Apply 1 drop to eye 4 (four) times daily. Instill 1 drop to both eyes four times daily.*Wait 3-5 minutes between eye drops*   Yes Historical Provider, MD  triamcinolone cream (KENALOG) 0.1 % Apply topically as directed. 09/25/13  Yes Historical Provider, MD   Physical Exam: Filed Vitals:   08/16/14 1045 08/16/14 1100 08/16/14 1115 08/16/14 1130  BP: 134/48 128/45 131/48 141/49  Pulse: 78 65 65 77  Temp:      TempSrc:      Resp: $Remo'22 19 19 17  'HrpPP$ SpO2: 100% 100% 100% 100%    Physical Exam  Constitutional: Appears well-developed and well-nourished. No distress.  HENT: Normocephalic. No tonsillar erythema or exudates Eyes: Conjunctivae and EOM are normal. PERRLA, no scleral icterus.  Neck: Normal ROM. Neck supple. No JVD.  No thyromegaly.  CVS: RRR, S1/S2 appreciated  Pulmonary: rhonchi bilaterally, no wheezing.  Abdominal: Soft. BS +, no tenderness, rebound or guarding.  Musculoskeletal: no tenderness to palpation ; right AKA Lymphadenopathy: No lymphadenopathy noted, cervical, inguinal. Neuro: Alert.  No focal neurologic deficits. Skin: Skin is warm and dry. Scattered areas of ecchymosis on hands. Psychiatric: Normal mood and affect.   Labs on Admission:  Basic Metabolic Panel:  Recent Labs Lab 08/16/14 1020  NA 135*  K 3.7  CL 91*  CO2 29  GLUCOSE 148*  BUN 36*  CREATININE 1.70*  CALCIUM 9.5   Liver Function Tests:  Recent Labs Lab 08/16/14 1020  AST 20  ALT 11  ALKPHOS 50  BILITOT 0.8  PROT 6.9  ALBUMIN 3.0*   No results for input(s): LIPASE, AMYLASE in the last 168 hours. No results for input(s): AMMONIA in the last 168 hours. CBC:  Recent Labs Lab 08/16/14 1124  WBC 16.0*  NEUTROABS PENDING  HGB 11.0*  HCT 32.8*  MCV 92.1  PLT 201   Cardiac Enzymes: No results for input(s): CKTOTAL, CKMB, CKMBINDEX,  TROPONINI in the last 168 hours. BNP: Invalid input(s): POCBNP CBG: No results for input(s): GLUCAP in the last 168 hours.  If  7PM-7AM, please contact night-coverage [email or web address] Password [email or web address] 08/16/2014, 12:08 PM

## 2014-08-16 NOTE — ED Notes (Signed)
Bed: WA11 Expected date:  Expected time:  Means of arrival:  Comments: EMS-fever 

## 2014-08-16 NOTE — Progress Notes (Signed)
ANTICOAGULATION CONSULT NOTE - Initial Consult  Pharmacy Consult for enoxaparin Indication: atrial fibrillation  Allergies  Allergen Reactions  . Chlorhexidine Dermatitis  . Evista [Raloxifene]     unknown  . Penicillins Other (See Comments)    unknown  . Triamterene Other (See Comments)    unknown  . Alendronate Sodium Rash    Patient Measurements: Height: 5' 2.3" (158.2 cm) Weight: 136 lb 14.4 oz (62.097 kg) IBW/kg (Calculated) : 50.79 Heparin Dosing Weight:   Vital Signs: Temp: 97.7 F (36.5 C) (12/04 1352) Temp Source: Axillary (12/04 1352) BP: 96/36 mmHg (12/04 1352) Pulse Rate: 76 (12/04 1352)  Labs:  Recent Labs  08/16/14 1020 08/16/14 1124  HGB  --  11.0*  HCT  --  32.8*  PLT  --  201  CREATININE 1.70*  --     Estimated Creatinine Clearance: 18 mL/min (by C-G formula based on Cr of 1.7).   Medical History: Past Medical History  Diagnosis Date  . Hypertension   . Atrial fibrillation   . Psoriasis   . Congestive heart failure (CHF)   . History of CVA (cerebrovascular accident)   . Coronary artery disease   . Myocardial infarction   . CHF (congestive heart failure)   . Shortness of breath   . H/O hiatal hernia   . Closed fracture of head of left humerus 5/12  . Basal cell carcinoma   . 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  Flow cytometry 04/04/12: 91% cells CD4 positive CD 26 negative  . Hyperlipemia   . Pneumonia   . Wound of right leg 09/25/2012    Necrotic, open wound right lower leg; non healing  . Stroke   . Hyposmolality and/or hyponatremia   . Lower limb amputation, above knee 10/11/2012  . Pain in joint, multiple sites   . Mycosis fungoides, unspecified site, extranodal and solid organ sites 09/28/2012  . Occlusion and stenosis of carotid artery without mention of cerebral infarction  09/28/2012  . Chronic venous hypertension with ulcer and inflammation 09/28/2012  . Diverticulosis of colon (without mention of hemorrhage) 09/28/2012  . Synovial cyst of popliteal space 03/26/2012  . Unspecified constipation 03/24/2012  . Hypopotassemia 01/26/2012  . Debility, unspecified 01/17/2012  . Anxiety state, unspecified 12/31/2011  . Anemia, unspecified 12/21/2011  . Other specified disease of white blood cells 12/21/2011  . Peripheral vascular disease, unspecified 12/21/2011    recent ABI Lt of 0.64 down from ).80 on 12/29/11- though different labs  . Reflux esophagitis 12/21/2011  . Unspecified hereditary and idiopathic peripheral neuropathy 12/14/2011  . Pain in joint, ankle and foot 12/14/2011  . Carotid stenosis     CAROTID DOPPLER, 05/02/2012 - LEFT VERTEBRAL-occluded, LEFT BULB AND PROXIMAL ICA STENT-moderate amount of irregular mixed dense plaque 50-69% diameter reduction  . Pre-syncope     NUCLEAR STRESS TEST, 12/10/2009 - normal, EKG negative for ischemia  . TIA (transient ischemic attack)     2D ECHO, 12/05/2012 - EF 60-65%, Severely calcified annulus of the mitral valve with mild-moderate regurgitation, LA moderate-severely dilated  . Paroxysmal atrial fibrillation   . CAD in native artery     3 vessel CAD, medical therapy, not a candidate for CABG  . S/P AKA (above knee amputation) unilateral, Rt leg due to PAD and non healing wound 06/27/2013  . HCAP (healthcare-associated pneumonia) 01/08/2012  . 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  She is also on chronic dabigatran therapy for paroxysmal atrial fibrillation.  She is noted to have ARF at admission with CrCl ~66ml/min and also on diltiazem (a moderate dual CYP-3A4 /p-gyp inhibitor) so probably best to avoid dabigtran until SCr  improves.  Discussed with Dr. Charlies Silvers will use enoxaparin in meantime, although also renally eliminated will at least be able to check levels if became needed.    Last dose of dabigatran 12/3 8p  CBC: Hgb = 11, pltc WNL  Renal fx: SCr = 1.7 (baseline is 0.8), CrCl = 27ml/min  Goal of Therapy:  Anti-Xa level 0.6-1 units/ml 4hrs after LMWH dose given Monitor platelets by anticoagulation protocol: Yes   Plan:   Lovenox 60mg  SQ q24h - start 12/5 am based on renal function and dabigatran half-life   If SCr improves to > 62ml/min recommend resume dabigatran 75mg  BID  Monitor for bleeding   Doreene Eland, PharmD, BCPS.   Pager: [phone]  08/16/2014,3:50 PM

## 2014-08-16 NOTE — ED Notes (Signed)
MD at bedside.  EDP KOHUT PRESENT- CODE SEPSIS II

## 2014-08-16 NOTE — ED Provider Notes (Signed)
CSN: [id number]     Arrival date & time 08/16/14  0945 History   First MD Initiated Contact with Patient 08/16/14 1005     Chief Complaint  Patient presents with  . Fever  . Hemoptysis     (Consider location/radiation/quality/duration/timing/severity/associated sxs/prior Treatment) HPI   80yF presenting from [facility] for evaluation of fever. Pt reports "not feeling well" since last night. Reports what sounds like scant hemoptysis, but not much in terms of specific complaints otherwise. Denies any acute pain anywhere. Denies SOB although noted to be hypoxic on RA. No n/v. No diarrhea. No urinary complaints. Pt is wheelchair bound. Doesn't appear to have hx of DVT/PE.   Past Medical History  Diagnosis Date  . Hypertension   . Atrial fibrillation   . Psoriasis   . Congestive heart failure (CHF)   . History of CVA (cerebrovascular accident)   . Coronary artery disease   . Myocardial infarction   . CHF (congestive heart failure)   . Shortness of breath   . H/O hiatal hernia   . Closed fracture of head of left humerus 5/12  . Basal cell carcinoma   . 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  Flow cytometry 04/04/12: 91% cells CD4 positive CD 26 negative  . Hyperlipemia   . Pneumonia   . Wound of right leg 09/25/2012    Necrotic, open wound right lower leg; non healing  . Stroke   . Hyposmolality and/or hyponatremia   . Lower limb amputation, above knee 10/11/2012  . Pain in joint, multiple sites   . Mycosis fungoides, unspecified site, extranodal and solid organ sites 09/28/2012  . Occlusion and stenosis of carotid artery without mention of cerebral infarction 09/28/2012  . Chronic venous hypertension with ulcer and inflammation 09/28/2012  . Diverticulosis of colon (without mention of hemorrhage) 09/28/2012  . Synovial cyst of popliteal  space 03/26/2012  . Unspecified constipation 03/24/2012  . Hypopotassemia 01/26/2012  . Debility, unspecified 01/17/2012  . Anxiety state, unspecified 12/31/2011  . Anemia, unspecified 12/21/2011  . Other specified disease of white blood cells 12/21/2011  . Peripheral vascular disease, unspecified 12/21/2011    recent ABI Lt of 0.64 down from ).80 on 12/29/11- though different labs  . Reflux esophagitis 12/21/2011  . Unspecified hereditary and idiopathic peripheral neuropathy 12/14/2011  . Pain in joint, ankle and foot 12/14/2011  . Carotid stenosis     CAROTID DOPPLER, 05/02/2012 - LEFT VERTEBRAL-occluded, LEFT BULB AND PROXIMAL ICA STENT-moderate amount of irregular mixed dense plaque 50-69% diameter reduction  . Pre-syncope     NUCLEAR STRESS TEST, 12/10/2009 - normal, EKG negative for ischemia  . TIA (transient ischemic attack)     2D ECHO, 12/05/2012 - EF 60-65%, Severely calcified annulus of the mitral valve with mild-moderate regurgitation, LA moderate-severely dilated  . Paroxysmal atrial fibrillation   . CAD in native artery     3 vessel CAD, medical therapy, not a candidate for CABG  . S/P AKA (above knee amputation) unilateral, Rt leg due to PAD and non healing wound 06/27/2013  . HCAP (healthcare-associated pneumonia) 01/08/2012  . 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 Tonsillectomy    . Dilation and curettage of uterus    . Eye surgery    . Cataracts    .  Vertebroplasty    . Amputation  10/10/2012    Procedure: AMPUTATION ABOVE KNEE;  Surgeon: Newt Minion, MD;  Location: [facility];  Service: Orthopedics;  Laterality: Right;  . Percutaneous stent intervention  04/13/2011    Left internal carotid artery stented with a 8x30 prcise nitinol self-expanding stent resulting reduction of 90% to less than 20% residual  . Cardiac catheterization  11/06/2010    CABG recommended, turned down by [facility] secondary  to co morbidities   Family History  Problem Relation Age of Onset  . Stomach cancer Mother   . Pneumonia Father   . Cirrhosis Brother   . Diabetes Brother   . Heart attack Paternal Grandmother   . Heart failure Sister   . Diabetes Sister   . Heart failure Sister    History  Substance Use Topics  . Smoking status: Never Smoker   . Smokeless tobacco: Never Used  . Alcohol Use: No   OB History    No data available     Review of Systems  All systems reviewed and negative, other than as noted in HPI.   Allergies  Evista; Penicillins; Triamterene; and Alendronate sodium  Home Medications   Prior to Admission medications   Medication Sig Start Date End Date Taking? Authorizing Provider  atorvastatin (LIPITOR) 40 MG tablet Take 1 tablet (40 mg total) by mouth daily at 6 PM. 12/05/12   Nita Sells, MD  bexarotene (TARGRETIN) 75 MG CAPS capsule Take 4 capsules (300 mg total) by mouth daily before supper. Give with food. Protect from light. CAUTION: Chemotherapy/Biotherapy-Wear gloves when handling 06/04/14 09/04/14  Heath Lark, MD  carvedilol (COREG) 12.5 MG tablet Take 1 tablet (12.5 mg total) by mouth 2 (two) times daily with a meal. 08/30/13   Janece Canterbury, MD  dabigatran (PRADAXA) 150 MG CAPS Take 75 mg by mouth every 12 (twelve) hours.  12/05/12   Nita Sells, MD  diltiazem (CARDIZEM CD) 120 MG 24 hr capsule Take 120 mg by mouth daily.     Historical Provider, MD  erythromycin ophthalmic ointment Place 1 application into both eyes See admin instructions. Apply to both eye lids at bedtime for 5 days then apply as needed    Historical Provider, MD  famotidine (PEPCID) 20 MG tablet Take 40 mg by mouth daily. At 4pm    Historical Provider, MD  famotidine (PEPCID) 40 MG tablet Take 40 mg by mouth daily. At 4pm.    Historical Provider, MD  folic acid (FOLVITE) 1 MG tablet Take 1 mg by mouth daily.    Historical Provider, MD  furosemide (LASIX) 40 MG tablet Take 1.5  tablets (60 mg total) by mouth 2 (two) times daily. 08/30/13   Janece Canterbury, MD  furosemide (LASIX) 40 MG tablet Take 40 mg by mouth See admin instructions. 1 tab daily as needed for weight gain of 2-3 lbs in 24 hours or 3-5lbs in 1 week.    Historical Provider, MD  gabapentin (NEURONTIN) 100 MG capsule Take 1 capsule (100 mg total) by mouth at bedtime. 12/05/12   Nita Sells, MD  guaiFENesin (MUCINEX) 600 MG 12 hr tablet Take by mouth 2 (two) times daily.    Historical Provider, MD  HYDROcodone-acetaminophen (NORCO/VICODIN) 5-325 MG per tablet Take 1 tablet by mouth every 6 (six) hours as needed for moderate pain.    Historical Provider, MD  ipratropium-albuterol (DUONEB) 0.5-2.5 (3) MG/3ML SOLN Take 3 mLs by nebulization every 6 (six) hours as needed.     Historical  Provider, MD  isosorbide mononitrate (IMDUR) 30 MG 24 hr tablet Take 1 tablet (30 mg total) by mouth daily. 06/27/13   Isaiah Serge, NP  levocetirizine (XYZAL) 5 MG tablet Take 5 mg by mouth as needed for allergies.    Historical Provider, MD  lisinopril (PRINIVIL,ZESTRIL) 5 MG tablet Take 5 mg by mouth daily.    Historical Provider, MD  nitroGLYCERIN (NITROSTAT) 0.4 MG SL tablet Place 0.4 mg under the tongue every 5 (five) minutes as needed for chest pain. If no relief call MD.    Historical Provider, MD  Omega-3 Fatty Acids (FISH OIL) 1000 MG CAPS Take 1,000 mg by mouth at bedtime.     Historical Provider, MD  polyethylene glycol (MIRALAX / GLYCOLAX) packet Take 17 g by mouth every other day. Take 1 packet mixed in 4 oz of beverage every day to relieve constipation.    Historical Provider, MD  Polyvinyl Alcohol-Povidone (REFRESH OP) Apply 1 drop to eye 4 (four) times daily. Instill 1 drop to both eyes four times daily.*Wait 3-5 minutes between eye drops*    Historical Provider, MD  triamcinolone cream (KENALOG) 0.1 % Apply topically as directed. 09/25/13   Historical Provider, MD  vitamin C (ASCORBIC ACID) 500 MG tablet Take  500 mg by mouth daily.    Historical Provider, MD   SpO2 86% Physical Exam  Constitutional: She appears well-developed and well-nourished. No distress.  HENT:  Head: Normocephalic and atraumatic.  Eyes: Conjunctivae are normal. Right eye exhibits no discharge. Left eye exhibits no discharge.  Neck: Neck supple.  Cardiovascular: Normal rate, regular rhythm and normal heart sounds.  Exam reveals no gallop and no friction rub.   No murmur heard. Pulmonary/Chest: Breath sounds normal.  Mild tachypnea. B/l rhonchi.   Abdominal: Soft. She exhibits no distension. There is no tenderness.  Musculoskeletal: She exhibits no edema or tenderness.  R AKA. L lower leg with healing wounds to lateral aspect. Black eschar. Do not appear infected.   Neurological: She is alert.  Speech clear, but rate somewhat slow. Seems to have a little trouble gathering her thoughts, but answers are appropriate. Follows commands. No focal motor deficit.   Skin: Skin is warm and dry.  Psychiatric: She has a normal mood and affect. Her behavior is normal. Thought content normal.  Nursing note and vitals reviewed.   ED Course  Procedures (including critical care time)  CRITICAL CARE Performed by: Virgel Manifold   Total critical care time: 35 minutes  Critical care time was exclusive of separately billable procedures and treating other patients.  Critical care was necessary to treat or prevent imminent or life-threatening deterioration. Sepsis. SBP < 80.   Critical care was time spent personally by me on the following activities: development of treatment plan with patient and/or surrogate as well as nursing, discussions with consultants, evaluation of patient's response to treatment, examination of patient, obtaining history from patient or surrogate, ordering and performing treatments and interventions, ordering and review of laboratory studies, ordering and review of radiographic studies, pulse oximetry and  re-evaluation of patient's condition.  Labs Review Labs Reviewed  COMPREHENSIVE METABOLIC PANEL - Abnormal; Notable for the following:    Sodium 135 (*)    Chloride 91 (*)    Glucose, Bld 148 (*)    BUN 36 (*)    Creatinine, Ser 1.70 (*)    Albumin 3.0 (*)    GFR calc non Af Amer 25 (*)    GFR calc Af Amer 29 (*)  All other components within normal limits  CBC WITH DIFFERENTIAL - Abnormal; Notable for the following:    WBC 16.0 (*)    RBC 3.56 (*)    Hemoglobin 11.0 (*)    HCT 32.8 (*)    All other components within normal limits  CULTURE, BLOOD (ROUTINE X 2)  CULTURE, BLOOD (ROUTINE X 2)  URINALYSIS, ROUTINE W REFLEX MICROSCOPIC  CBC WITH DIFFERENTIAL  PRO B NATRIURETIC PEPTIDE  I-STAT CG4 LACTIC ACID, ED    Imaging Review Dg Chest Portable 1 View  08/16/2014   CLINICAL DATA:  80 year old female with fever and hemoptysis. Initial encounter. Shortness of Breath.  EXAM: PORTABLE CHEST - 1 VIEW  COMPARISON:  07/2014 and earlier.  FINDINGS: Portable AP semi upright view at 1043 hrs. New confluent right upper lobe pulmonary opacity with air bronchograms. No superimposed pneumothorax or pleural effusion identified. No pulmonary edema. Stable cardiac size and mediastinal contours. Sequelae of lower thoracic kyphoplasty. Anterior costochondral rib calcifications. Stable proximal left humerus.  IMPRESSION: 1. Right upper lobe pneumonia. Post treatment radiographs recommended to document resolution. 2. No pleural effusion identified.   Electronically Signed   By: Lars Pinks M.D.   On: 08/16/2014 11:19     EKG Interpretation None      MDM   Final diagnoses:  Fever  Severe sepsis  Healthcare-associated pneumonia  Acute renal failure, unspecified acute renal failure type    Likely sepsis. Febrile. Hypotensive. Empiric antibiotics. Fluid resuscitation. She's been complaining of cough and she is hypoxic on room air. Chest x-ray. Wounds to LLE do not appear infected. No specific  urinary complaints.  Urinalysis. Blood work including lactic acid and blood cultures. She confirms her DO NOT RESUSCITATE status.  RUL pneumonia. From [facility]. HCAP. BP improving with IVF. Sats improved with supplemental o2 via [place]. BMP with AKI, likely pre-renal. Expect to improve with hydration/normalization of BP. Needs admit.     Virgel Manifold, MD 08/16/14 [phone]

## 2014-08-16 NOTE — Progress Notes (Signed)
Listened to pt breath sounds with Lucina Mellow. Breath sounds are very crackles with expiratory wheezes.  Will continue to monitor for any changes.  O2 stats are in the mid to upper 90's on 3 L.  Will continue to monitor closely.

## 2014-08-16 NOTE — Progress Notes (Signed)
Informed by ICU RN about pt with increasing work of breathing. Recommended low dose morphine for now. Pt most likely will not be able to tolerate BiPAP so will keep on telemetry unit. [name] [facility] [phone]

## 2014-08-17 ENCOUNTER — Inpatient Hospital Stay (HOSPITAL_COMMUNITY): Payer: Medicare Other

## 2014-08-17 DIAGNOSIS — N179 Acute kidney failure, unspecified: Secondary | ICD-10-CM | POA: Insufficient documentation

## 2014-08-17 LAB — COMPREHENSIVE METABOLIC PANEL
ALBUMIN: 2.5 g/dL — AB (ref 3.5–5.2)
ALK PHOS: 35 U/L — AB (ref 39–117)
ALT: 10 U/L (ref 0–35)
AST: 20 U/L (ref 0–37)
Anion gap: 16 — ABNORMAL HIGH (ref 5–15)
BILIRUBIN TOTAL: 0.6 mg/dL (ref 0.3–1.2)
BUN: 41 mg/dL — ABNORMAL HIGH (ref 6–23)
CHLORIDE: 97 meq/L (ref 96–112)
CO2: 21 meq/L (ref 19–32)
CREATININE: 1.62 mg/dL — AB (ref 0.50–1.10)
Calcium: 8.4 mg/dL (ref 8.4–10.5)
GFR calc Af Amer: 30 mL/min — ABNORMAL LOW (ref >90)
GFR, EST NON AFRICAN AMERICAN: 26 mL/min — AB (ref >90)
Glucose, Bld: 112 mg/dL — ABNORMAL HIGH (ref 70–99)
POTASSIUM: 3.5 meq/L — AB (ref 3.7–5.3)
Sodium: 134 mEq/L — ABNORMAL LOW (ref 137–147)
Total Protein: 6 g/dL (ref 6.0–8.3)

## 2014-08-17 LAB — CBC
HEMATOCRIT: 34.5 % — AB (ref 36.0–46.0)
Hemoglobin: 11 g/dL — ABNORMAL LOW (ref 12.0–15.0)
MCH: 30 pg (ref 26.0–34.0)
MCHC: 31.9 g/dL (ref 30.0–36.0)
MCV: 94 fL (ref 78.0–100.0)
PLATELETS: 195 10*3/uL (ref 150–400)
RBC: 3.67 MIL/uL — ABNORMAL LOW (ref 3.87–5.11)
RDW: 13.7 % (ref 11.5–15.5)
WBC: 15.7 10*3/uL — AB (ref 4.0–10.5)

## 2014-08-17 LAB — GLUCOSE, CAPILLARY: Glucose-Capillary: 98 mg/dL (ref 70–99)

## 2014-08-17 MED ORDER — IPRATROPIUM-ALBUTEROL 0.5-2.5 (3) MG/3ML IN SOLN: 3.0000 mL | RESPIRATORY_TRACT | Status: DC | PRN

## 2014-08-17 MED ORDER — LORAZEPAM 2 MG/ML IJ SOLN
0.5000 mg | Freq: Once | INTRAMUSCULAR | Status: AC
Start: 2014-08-17 — End: 2014-08-17
  Administered 2014-08-17: 0.5 mg via INTRAVENOUS

## 2014-08-17 MED ORDER — SODIUM CHLORIDE 0.9 % IV BOLUS (SEPSIS)
250.0000 mL | Freq: Once | INTRAVENOUS | Status: AC
  Administered 2014-08-17: 250 mL via INTRAVENOUS

## 2014-08-17 MED ORDER — POTASSIUM CHLORIDE IN NACL 40-0.9 MEQ/L-% IV SOLN
INTRAVENOUS | Status: DC
  Administered 2014-08-17: 50 mL/h via INTRAVENOUS
  Filled 2014-08-17 (×3): qty 1000

## 2014-08-17 NOTE — Progress Notes (Signed)
Pt currently off BIPAP and on 3 LPM [place].  Pt states that she feels like she is breathing okay and thinks she doesn't need BIPAP at this time.  Pt vitals are WNL.  BIPAP not needed at this time, RT to monitor and assess as needed.

## 2014-08-17 NOTE — Progress Notes (Signed)
At shift change patients BP was 60s/30s. Paged NP on call new orders received verbally over phone. Towards end of bolus, NP repaged reguarding patient status.  RRT called to assist.   Sara [name]

## 2014-08-17 NOTE — Progress Notes (Signed)
Pharmacist Heart Failure Core Measure Documentation  Assessment: Sara [name] has an EF documented as 55-60% on 08/28/13 by Dr Ellyn Hack.  Rationale: Heart failure patients with left ventricular systolic dysfunction (LVSD) and an EF < 40% should be prescribed an angiotensin converting enzyme inhibitor (ACEI) or angiotensin receptor blocker (ARB) at discharge unless a contraindication is documented in the medical record.  This patient is not currently on an ACEI or ARB for HF.  This note is being placed in the record in order to provide documentation that a contraindication to the use of these agents is present for this encounter.  ACE Inhibitor or Angiotensin Receptor Blocker is contraindicated (specify all that apply)  []   ACEI allergy AND ARB allergy []   Angioedema []   Moderate or severe aortic stenosis []   Hyperkalemia [x]   Hypotension []   Renal artery stenosis []   Worsening renal function, preexisting renal disease or dysfunction   [name] 08/17/2014 3:27 PM

## 2014-08-17 NOTE — Progress Notes (Signed)
eLink Physician-Brief Progress Note Patient Name: Sara [name] DOB: [birth year] MRN: [id number]   Date of Service  08/17/2014  HPI/Events of Note  Low BP, sys in the 70s, CXR this AM RUL consolidation (PNA)  eICU Interventions  250cc NS bolus over 1 hrs.      Intervention Category Intermediate Interventions: Hypotension - evaluation and management  Taquisha Phung 08/17/2014, 4:22 PM

## 2014-08-17 NOTE — Progress Notes (Signed)
Wasted 1.5mg  of ativan. Witnessed by Lolita Lenz, RN and Barnabas Lister, RN

## 2014-08-17 NOTE — Plan of Care (Signed)
Problem: Phase I Progression Outcomes Goal: Confirm chest x-ray completed Outcome: Completed/Met Date Met:  08/17/14 Goal: Code status addressed with pt/family Outcome: Completed/Met Date Met:  08/17/14 Goal: Initial discharge plan identified Outcome: Completed/Met Date Met:  08/17/14

## 2014-08-17 NOTE — Progress Notes (Signed)
Patient ID: Sara [name], female   DOB: [birth year], 80 y.o.   MRN: [id number]  [facility] PROGRESS NOTE  Sara [name] GEX:[id number] DOB: Oct 25, 1918 DOA: 08/16/2014 PCP: Estill Dooms, MD  Brief narrative: 80 year old female with past medical history of PAD status post stenting (LICA 09/270), right AKA, paroxysmal atrial fibrillation on AC with pradaxa,dementia, hypertension, chronic diastolic CHF (2 D ECHO in 08/2014 with EF of 55%, wheelchair bound who presented from [place] to [facility] ED with reports of not feeling very well and reports of fevers. She is not a very good historian due to dementia. Apparently no respiratory distress but on admission patient was found to have O2 saturation of 86%. No other complaints.  In ED, BP was 71/42 to 141/49, HR 59-91, RR 25 and T max 102 F. Oxygen saturation was 86% on room air, improved to 92% with [place] oxygen support. CXR consistent with right upper lung lobe pneumonia. She was started on broad spectrum abx, cefepime and vanco for sepsis and pneumonia. She is DNR and stable with current BP of 141/49 to go to telemetry floor.   Assessment and Plan:   Principal Problem: Severe sepsis / Leukocytosis / HCAP  - sepsis criteria met with initial vitals hypotension, tachypnea, bradycardia, T max 102 F, evidence of end organ damange which includes hypoxia and acute renal failure. Additionally, WBC count 16 and evidence of pneumonia on CXR. - preliminary blood cultures positive for g- rods (? Source) - started broad spectrum antibiotics and will continue the same regimen with Vancomycin and Maxipime day #2 - follow up blood cultures, resp culture, legionella and strep pneumonia results - pt is partial code with no intubation requested unless determined to be temporarily needed  - nebulizer treatments as needed for shortness of breath  - keep in SDU today   Active Problems: Chronic diastolic HF (heart failure) - last BNP in 12/2013 with BNP in  300-400 range. 2 D ECHO in 08/2013 showed EF 55%. - seems compensated; respiratory hypoxia likely due to CAP, sepsis HTN (hypertension) - all antihypertensives on hold due to hypotension  PAF (paroxysmal atrial fibrillation) / Chronic anticoagulation- Pradaxa - on pradaxa but will temporarily put on hold due to worsening renal failure - spoke with pharmacy in regards to alternative and we have switched to Lovenox sub Q for now  S/P AKA (above knee amputation) unilateral, Rt leg due to PAD and non healing wound / PAD (peripheral artery disease)- LICA stent 5/36 - stable - continue gabapentin  Stroke, acute, Rt brain, embolic March 6440 - pt is on pradaxa but temporarily put on hold due to worsening renal failure. 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 - hold targretin in the setting of an acute infection, sepsis ARF (acute renal failure)  - lisinopril and lasix may be contributing to renal failure - both meds put on hold due to ARF and hypotension - Cr slightly down since since admission  - repeat BMP in AM Anemia of chronic disease - secondary to peripheral T cell lymphoma and chemotherapy  - hemoglobin is 11 on admission and remains stable  - no current indications for transfusion  GERD (gastroesophageal reflux disease) - continue Pepcid 40 mg daily  Hypokalemia - mild, will supplement and  repeat BMP in AM  DVT prophylaxis:  - enoxaparin subQ  Code Status: Partial, no intubation  Family Communication: Family at bedside Disposition Plan: Remains inpatient   IV Access:   Peripheral IV Procedures and diagnostic studies:    CXR  08/17/2014 Persistent dense consolidative right upper lobe pneumonia.  Background COPD/ emphysema.    CXR  08/16/2014  Increasing right upper lobe pneumonia.   CXR 08/16/2014  Right upper lobe pneumonia. Post treatment radiographs recommended to document resolution.   Medical Consultants:   None  Other Consultants:   Physical therapy  Anti-Infectives:   Vancomycin 12/04 --> Maxipime 12/04 -->  [name], MD  [facility] Pager [phone]  If 7PM-7AM, please contact night-coverage [email or web address] Password [email or web address] 08/17/2014, 9:17 AM   LOS: 1 day   HPI/Subjective: No events overnight.   Objective: Filed Vitals:   08/17/14 0500 08/17/14 0530 08/17/14 0800 08/17/14 0824  BP: 72/37 81/46    Pulse: 96 83  73  Temp:   98.8 F (37.1 C)   TempSrc:   Oral   Resp: _0 Height:      Weight:      SpO2: 95% 97%  98%    Intake/Output Summary (Last 24 hours) at 08/17/14 2330 Last data filed at 08/17/14 0500  Gross per 24 hour  Intake 3286.67 ml  Output    250 ml  Net 3036.67 ml    Exam:   General:  Pt is alert, follows commands appropriately, not in acute distress, on BiPAP  Cardiovascular: Regular rate and rhythm, S1/S2, no murmurs, no rubs, no gallops  Respiratory: Clear to auscultation bilaterally, diminished air movement at bases with rhonchi   Abdomen: Soft, non tender, non distended, bowel sounds present, no guarding  Extremities: pulses DP and PT palpable LLE, Right BKA  Neuro: Grossly nonfocal  Data Reviewed: Basic Metabolic Panel:  Recent Labs Lab 08/16/14 1020 08/17/14 0350  NA 135* 134*  K 3.7 3.5*  CL 91* 97  CO2 29 21  GLUCOSE 148* 112*  BUN 36* 41*  CREATININE 1.70* 1.62*  CALCIUM 9.5 8.4   Liver Function Tests:  Recent Labs Lab 08/16/14 1020 08/17/14 0350  AST 20 20  ALT 11 10  ALKPHOS 50 35*  BILITOT 0.8 0.6  PROT 6.9 6.0  ALBUMIN 3.0* 2.5*   CBC:  Recent Labs Lab 08/16/14 1124 08/17/14 0350  WBC 16.0* 15.7*  NEUTROABS 10.5*  --   HGB 11.0* 11.0*  HCT 32.8* 34.5*  MCV 92.1 94.0  PLT 201 195   CBG:  Recent Labs Lab 08/17/14 0751   GLUCAP 98    Recent Results (from the past 240 hour(s))  Blood culture (routine x 2)     Status: None (Preliminary result)   Collection Time: 08/16/14 10:20 AM  Result Value Ref Range Status   Specimen Description BLOOD RIGHT ANTECUBITAL  Final    GRAM NEGATIVE RODS     Report Status PENDING  Incomplete  Blood culture (routine x 2)     Status: None (Preliminary result)   Collection Time: 08/16/14 10:25 AM  Result Value Ref Range Status   Specimen Description BLOOD LEFT ANTECUBITAL  Final   Special Requests BOTTLES DRAWN AEROBIC AND ANAEROBIC 3ML  Final   Culture  Setup Time   Final    08/16/2014 12:39 Performed at [facility]    Culture   Final    GRAM NEGATIVE RODS Note: Gram Stain Report Called to,Read Back By and Verified With:  TEVET WOODS RN [id number] Performed at [facility]    Report Status PENDING  Incomplete  MRSA PCR Screening     Status: None   Collection Time: 08/16/14  2:10 PM  Result Value Ref Range Status   MRSA by PCR NEGATIVE NEGATIVE Final     Scheduled Meds: . ceFEPime IV  1 g Intravenous Q24H  . enoxaparin  injection  60 mg Subcutaneous Q24H  . famotidine  40 mg Oral Daily  . folic acid  1 mg Oral Q breakfast  . polyethylene glycol  17 g Oral QODAY  . polyvinyl alcohol  1 drop Both Eyes BID  . triamcinolone cream   Topical UD  . ancomycin  1,000 mg Intravenous Q48H   Continuous Infusions: . sodium chloride 50 mL/hr at 08/16/14 1824  . sodium chloride 50 mL/hr at 08/16/14 2255

## 2014-08-18 LAB — CULTURE, BLOOD (ROUTINE X 2)

## 2014-08-18 LAB — URINE CULTURE
CULTURE: NO GROWTH
Colony Count: NO GROWTH

## 2014-08-18 LAB — BASIC METABOLIC PANEL
Anion gap: 15 (ref 5–15)
Anion gap: 16 — ABNORMAL HIGH (ref 5–15)
BUN: 49 mg/dL — AB (ref 6–23)
BUN: 50 mg/dL — AB (ref 6–23)
CALCIUM: 8.8 mg/dL (ref 8.4–10.5)
CALCIUM: 9.2 mg/dL (ref 8.4–10.5)
CO2: 22 mEq/L (ref 19–32)
CO2: 21 mEq/L (ref 19–32)
CREATININE: 1.59 mg/dL — AB (ref 0.50–1.10)
CREATININE: 1.4 mg/dL — AB (ref 0.50–1.10)
Chloride: 104 mEq/L (ref 96–112)
Chloride: 107 mEq/L (ref 96–112)
GFR calc Af Amer: 31 mL/min — ABNORMAL LOW (ref >90)
GFR calc Af Amer: 36 mL/min — ABNORMAL LOW (ref >90)
GFR, EST NON AFRICAN AMERICAN: 27 mL/min — AB (ref >90)
GFR, EST NON AFRICAN AMERICAN: 31 mL/min — AB (ref >90)
GLUCOSE: 107 mg/dL — AB (ref 70–99)
Glucose, Bld: 166 mg/dL — ABNORMAL HIGH (ref 70–99)
Potassium: 4.6 mEq/L (ref 3.7–5.3)
Potassium: 4.8 mEq/L (ref 3.7–5.3)
Sodium: 141 mEq/L (ref 137–147)
Sodium: 144 mEq/L (ref 137–147)

## 2014-08-18 LAB — CBC
HEMATOCRIT: 31.6 % — AB (ref 36.0–46.0)
HEMOGLOBIN: 10.2 g/dL — AB (ref 12.0–15.0)
MCH: 30.4 pg (ref 26.0–34.0)
MCHC: 32.3 g/dL (ref 30.0–36.0)
MCV: 94.3 fL (ref 78.0–100.0)
Platelets: 197 10*3/uL (ref 150–400)
RBC: 3.35 MIL/uL — ABNORMAL LOW (ref 3.87–5.11)
RDW: 13.9 % (ref 11.5–15.5)
WBC: 22.8 10*3/uL — ABNORMAL HIGH (ref 4.0–10.5)

## 2014-08-18 MED ORDER — DILTIAZEM HCL ER COATED BEADS 120 MG PO CP24
120.0000 mg | ORAL_CAPSULE | Freq: Every day | ORAL | Status: DC
  Administered 2014-08-18 – 2014-08-27 (×9): 120 mg via ORAL
  Filled 2014-08-18 (×10): qty 1

## 2014-08-18 MED ORDER — CARVEDILOL 12.5 MG PO TABS
12.5000 mg | ORAL_TABLET | Freq: Two times a day (BID) | ORAL | Status: DC
  Administered 2014-08-18 – 2014-08-27 (×17): 12.5 mg via ORAL
  Filled 2014-08-18 (×18): qty 1

## 2014-08-18 MED ORDER — FUROSEMIDE 10 MG/ML IJ SOLN
20.0000 mg | Freq: Once | INTRAMUSCULAR | Status: AC
  Administered 2014-08-18: 20 mg via INTRAVENOUS
  Filled 2014-08-18: qty 2

## 2014-08-18 NOTE — Progress Notes (Signed)
UR Completed.  [phone]  

## 2014-08-18 NOTE — Progress Notes (Addendum)
Patient ID: Sara [name], female   DOB: April 21, 1919, 80 y.o.   MRN: [id number] [facility] PROGRESS NOTE  MANREET KIERNAN RCV:[id number] DOB: [birth year] DOA: 08/16/2014 PCP: Estill Dooms, MD   Brief narrative: 80 year old female with past medical history of PAD status post stenting (LICA 06/2584), right AKA, paroxysmal atrial fibrillation on AC with pradaxa,dementia, hypertension, chronic diastolic CHF (2 D ECHO in 08/2014 with EF of 55%, wheelchair bound who presented from [place] to [facility] ED with reports of not feeling very well and reports of fevers. She is not a very good historian due to dementia. Apparently no respiratory distress but on admission patient was found to have O2 saturation of 86%. No other complaints.  In ED, BP was 71/42 to 141/49, HR 59-91, RR 25 and T max 102 F. Oxygen saturation was 86% on room air, improved to 92% with [place] oxygen support. CXR consistent with right upper lung lobe pneumonia. She was started on broad spectrum abx, cefepime and vanco for sepsis and pneumonia. She is DNR and stable with current BP of 141/49 to go to telemetry floor.   Assessment and Plan:   Principal Problem: Severe sepsis / Leukocytosis / HCAP  - sepsis criteria met with initial vitals hypotension, tachypnea, bradycardia, T max 102 F, evidence of end organ damange which includes hypoxia and acute renal failure. Additionally, WBC count 16 and evidence of pneumonia on CXR. - preliminary blood cultures positive for g- rods (? Source) - started broad spectrum antibiotics and will continue the same regimen with Vancomycin and Maxipime day #3 - follow up on blood cultures, resp culture, legionella and strep pneumonia results - pt is partial code with no intubation requested unless determined to be temporarily needed  - nebulizer treatments as needed for shortness of breath  - keep in SDU today   Active Problems: Chronic diastolic HF (heart failure) - last BNP in 12/2013 with BNP  in 300-400 range. 2 D ECHO in 08/2013 showed EF 55%. - weight is up from admission and not sure if error: 136 lbs --> 151 lbs this AM - will stop IVF for now as BP is stable - will ask to recheck weight this AM - continue to monitor daily weights, strict I's and O's HTN (hypertension) - all antihypertensives on hold since admission due to hypotension  - BP stabilizing and SBP in 140 - 150's this AM - resume Coreg 12. 5 mg PO BID and Cardizem - continue to hold Lasix, Lisinopril, Imdur  PAF (paroxysmal atrial fibrillation) / Chronic anticoagulation- Pradaxa - on pradaxa but will temporarily keep hold due to worsening renal failure - spoke with pharmacy in regards to alternative and we have switched to Lovenox sub Q for now  S/P AKA (above knee amputation) unilateral, Rt leg due to PAD and non healing wound / PAD (peripheral artery disease)- LICA stent 2/77 - stable - continue gabapentin  Stroke, acute, Rt brain, embolic March 8242 - pt is on pradaxa but temporarily put on hold due to worsening renal failure. 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 - hold targretin in the setting of an acute infection, sepsis ARF (acute renal failure)  - lisinopril and lasix may be contributing to renal failure - both meds still on  hold due to ARF and hypotension - Cr slightly down since since admission  - repeat BMP in AM Anemia of chronic disease - secondary to peripheral T cell lymphoma and chemotherapy  - hemoglobin is 11 on admission and slightly down since admission, likely dilutional  - no signs of active bleeding  - no current indications for transfusion  GERD (gastroesophageal reflux disease) - continue Pepcid 40 mg daily  Hypokalemia - supplemented and WNL  this AM Severe PCM - in the context of acute illness - advance diet as pt able to tolerate   DVT prophylaxis:  - enoxaparin subQ  Code Status: Partial, no intubation  Family Communication: No family at bedside this AM Disposition Plan: Remains inpatient   IV Access:    Peripheral IV Procedures and diagnostic studies:    CXR 08/17/2014 Persistent dense consolidative right upper lobe pneumonia. Background COPD/ emphysema.   CXR 08/16/2014 Increasing right upper lobe pneumonia.   CXR 08/16/2014 Right upper lobe pneumonia. Post treatment radiographs recommended to document resolution.  Medical Consultants:    None  Other Consultants:    Physical therapy  Anti-Infectives:    Vancomycin 12/04 -->  Maxipime 12/04 -->  [name], MD  [facility] Pager [phone]  If 7PM-7AM, please contact night-coverage [email or web address] Password [email or web address] 08/18/2014, 7:36 AM   LOS: 2 days   HPI/Subjective: No events overnight.   Objective: Filed Vitals:   08/18/14 0430 08/18/14 0500 08/18/14 0530 08/18/14 0600  BP:  144/64  138/58  Pulse: 75 73 77 75  Temp:      TempSrc:      Resp: $Remo'24 21 24 22  'QOXXd$ Height:      Weight:      SpO2: 100% 99% 100% 100%    Intake/Output Summary (Last 24 hours) at 08/18/14 0736 Last data filed at 08/18/14 0600  Gross per 24 hour  Intake 2069.17 ml  Output    630 ml  Net 1439.17 ml    Exam:   General:  Pt is alert, follows commands appropriately,on BiPAP  Cardiovascular: Regular rate and rhythm, no rubs, no gallops  Respiratory: Clear to auscultation bilaterally, diminished breath sounds at bases with scattered rhonchi   Abdomen: Soft, non tender, non distended, bowel sounds present, no guarding  Extremities: No edema, R AKA  Data Reviewed: Basic Metabolic Panel:  Recent Labs Lab 08/16/14 1020 08/17/14 0350 08/18/14 0400  NA 135* 134* 141  K 3.7 3.5* 4.6  CL 91* 97 104  CO2 $Re'29 21 22  'PzD$ GLUCOSE 148* 112* 107*  BUN 36*  41* 49*  CREATININE 1.70* 1.62* 1.59*  CALCIUM 9.5 8.4 8.8   Liver Function Tests:  Recent Labs Lab 08/16/14 1020 08/17/14 0350  AST 20 20  ALT 11 10  ALKPHOS 50 35*  BILITOT 0.8 0.6  PROT 6.9 6.0  ALBUMIN 3.0* 2.5*   CBC:  Recent Labs Lab 08/16/14 1124 08/17/14 0350 08/18/14 0400  WBC 16.0* 15.7* 22.8*  NEUTROABS 10.5*  --   --   HGB 11.0* 11.0* 10.2*  HCT 32.8* 34.5* 31.6*  MCV 92.1 94.0 94.3  PLT 201 195 197   CBG:  Recent Labs Lab 08/17/14 0751  GLUCAP 98    Recent Results (from the past 240 hour(s))  Blood culture (routine x 2)     Status: None (Preliminary result)   Collection Time: 08/16/14 10:20 AM  Result Value Ref Range Status   Specimen Description BLOOD RIGHT ANTECUBITAL  Final   Special Requests BOTTLES DRAWN AEROBIC AND ANAEROBIC  5ML  Final   Culture  Setup Time   Final    08/16/2014 12:39 Performed at [place] Performed at [facility]    Report Status PENDING  Incomplete  Blood culture (routine x 2)     Status: None (Preliminary result)   Collection Time: 08/16/14 10:25 AM  Result Value Ref Range Status   Specimen Description BLOOD LEFT ANTECUBITAL  Final   Culture  Setup Time   Final    08/16/2014 12:39 Performed at [place]    Culture   Final    GRAM NEGATIVE RODS Note: Gram Stain Report Called to,Read Back By and Verified With: TEVET WOODS RN [id number] Performed at [facility]    Report Status PENDING  Incomplete  MRSA PCR Screening     Status: None   Collection Time: 08/16/14  2:10 PM  Result Value Ref Range Status   MRSA by PCR NEGATIVE NEGATIVE Final     Scheduled Meds: . ceFEPime IV  1 g Intravenous Q24H  . enoxaparin  injection  60 mg Subcutaneous Q24H  . famotidine  40 mg Oral Daily  . polyethylene glycol  17 g Oral QODAY  . polyvinyl alcohol  1 drop Both Eyes BID  . triamcinolone cream   Topical UD  . vancomycin  1,000 mg Intravenous  Q48H   Continuous Infusions: . 0.9 % NaCl with KCl 40 mEq / L 50 mL/hr (08/17/14 1225)

## 2014-08-18 NOTE — Progress Notes (Signed)
Pt currently on [place] and tolerating well at this time, Pt in no noted distress.  RT to monitor and assess as needed.

## 2014-08-18 NOTE — Progress Notes (Signed)
Dr Doyle Askew called back and spoke with daughter at length.  Continue to monitor patient closely.

## 2014-08-18 NOTE — Progress Notes (Signed)
RN removed Pt from BIPAP due to Pt's request.  Pt currently on 4 LPM [place] and tolerating well at this time, RT to monitor and assess as needed.

## 2014-08-18 NOTE — Progress Notes (Signed)
Family at patient's bedside and nurse updated family.  Family wishes to speak with Dr. Doyle Askew and nurse has paged the doctor three times over 45 minutes without a response.  Family aware that doctor has not returned her page.  Continue to monitor patient closely.

## 2014-08-19 ENCOUNTER — Inpatient Hospital Stay (HOSPITAL_COMMUNITY): Payer: Medicare Other

## 2014-08-19 DIAGNOSIS — I5032 Chronic diastolic (congestive) heart failure: Secondary | ICD-10-CM

## 2014-08-19 DIAGNOSIS — R7881 Bacteremia: Secondary | ICD-10-CM

## 2014-08-19 DIAGNOSIS — R6520 Severe sepsis without septic shock: Secondary | ICD-10-CM

## 2014-08-19 DIAGNOSIS — B961 Klebsiella pneumoniae [K. pneumoniae] as the cause of diseases classified elsewhere: Secondary | ICD-10-CM

## 2014-08-19 DIAGNOSIS — J189 Pneumonia, unspecified organism: Secondary | ICD-10-CM

## 2014-08-19 LAB — LEGIONELLA ANTIGEN, URINE

## 2014-08-19 LAB — BASIC METABOLIC PANEL
Anion gap: 15 (ref 5–15)
BUN: 47 mg/dL — AB (ref 6–23)
CO2: 23 mEq/L (ref 19–32)
Calcium: 9.4 mg/dL (ref 8.4–10.5)
Chloride: 109 mEq/L (ref 96–112)
Creatinine, Ser: 1.23 mg/dL — ABNORMAL HIGH (ref 0.50–1.10)
GFR calc Af Amer: 42 mL/min — ABNORMAL LOW (ref >90)
GFR, EST NON AFRICAN AMERICAN: 37 mL/min — AB (ref >90)
GLUCOSE: 118 mg/dL — AB (ref 70–99)
Potassium: 4.5 mEq/L (ref 3.7–5.3)
Sodium: 147 mEq/L (ref 137–147)

## 2014-08-19 LAB — CBC
HCT: 33.6 % — ABNORMAL LOW (ref 36.0–46.0)
Hemoglobin: 10.6 g/dL — ABNORMAL LOW (ref 12.0–15.0)
MCH: 29.9 pg (ref 26.0–34.0)
MCHC: 31.5 g/dL (ref 30.0–36.0)
MCV: 94.6 fL (ref 78.0–100.0)
Platelets: 181 10*3/uL (ref 150–400)
RBC: 3.55 MIL/uL — AB (ref 3.87–5.11)
RDW: 13.8 % (ref 11.5–15.5)
WBC: 24.1 10*3/uL — ABNORMAL HIGH (ref 4.0–10.5)

## 2014-08-19 MED ORDER — FAMOTIDINE 20 MG PO TABS
20.0000 mg | ORAL_TABLET | Freq: Every day | ORAL | Status: DC
  Administered 2014-08-19 – 2014-08-27 (×9): 20 mg via ORAL
  Filled 2014-08-19 (×9): qty 1

## 2014-08-19 MED ORDER — ACETAMINOPHEN 650 MG RE SUPP
650.0000 mg | Freq: Four times a day (QID) | RECTAL | Status: DC | PRN
  Administered 2014-08-19 – 2014-08-21 (×3): 650 mg via RECTAL
  Filled 2014-08-19 (×4): qty 1

## 2014-08-19 MED ORDER — RESOURCE THICKENUP CLEAR PO POWD
ORAL | Status: DC | PRN
  Filled 2014-08-19: qty 125

## 2014-08-19 MED ORDER — FUROSEMIDE 10 MG/ML IJ SOLN: 20.0000 mg | Freq: Once | INTRAMUSCULAR | Status: DC

## 2014-08-19 MED ORDER — ENSURE PUDDING PO PUDG
1.0000 | Freq: Two times a day (BID) | ORAL | Status: DC
  Administered 2014-08-19 – 2014-08-26 (×7): 1 via ORAL
  Filled 2014-08-19 (×17): qty 1

## 2014-08-19 MED ORDER — ENOXAPARIN SODIUM 80 MG/0.8ML ~~LOC~~ SOLN
1.0000 mg/kg | SUBCUTANEOUS | Status: DC
  Administered 2014-08-20 – 2014-08-22 (×3): 65 mg via SUBCUTANEOUS
  Filled 2014-08-19 (×5): qty 0.8

## 2014-08-19 MED ORDER — FUROSEMIDE 10 MG/ML IJ SOLN
20.0000 mg | Freq: Two times a day (BID) | INTRAMUSCULAR | Status: DC
  Administered 2014-08-19 – 2014-08-20 (×3): 20 mg via INTRAVENOUS
  Filled 2014-08-19 (×3): qty 2

## 2014-08-19 MED ORDER — LABETALOL HCL 5 MG/ML IV SOLN
10.0000 mg | INTRAVENOUS | Status: DC | PRN
  Administered 2014-08-19 – 2014-08-22 (×8): 10 mg via INTRAVENOUS
  Filled 2014-08-19 (×8): qty 4

## 2014-08-19 MED ORDER — ISOSORBIDE MONONITRATE ER 60 MG PO TB24
30.0000 mg | ORAL_TABLET | Freq: Every day | ORAL | Status: DC
  Administered 2014-08-20 – 2014-08-27 (×8): 30 mg via ORAL
  Filled 2014-08-19 (×8): qty 1

## 2014-08-19 NOTE — Progress Notes (Addendum)
Patient ID: Sara [name], female   DOB: June 11, 1919, 80 y.o.   MRN: [id number]  [facility] PROGRESS NOTE  Sara [name] MGQ:[id number] DOB: 1919/01/30 DOA: 08/16/2014 PCP: Estill Dooms, MD   Brief narrative: 80 year old female with past medical history of PAD status post stenting (LICA 26/7124), right AKA, paroxysmal atrial fibrillation on AC with pradaxa,dementia, hypertension, chronic diastolic CHF (2 D ECHO in 08/2014 with EF of 55%, wheelchair bound who presented from [place] to [facility] ED with reports of not feeling very well and reports of fevers. She is not a very good historian due to dementia. Apparently no respiratory distress but on admission patient was found to have O2 saturation of 86%. No other complaints.  In ED, BP was 71/42 to 141/49, HR 59-91, RR 25 and T max 102 F. Oxygen saturation was 86% on room air, improved to 92% with [place] oxygen support. CXR consistent with right upper lung lobe pneumonia. She was started on broad spectrum abx, cefepime and vanco for sepsis and pneumonia. She is DNR and stable with current BP of 141/49 to go to telemetry floor.   Assessment and Plan:   Principal Problem: Severe sepsis / Leukocytosis / HCAP  - sepsis criteria met with initial vitals hypotension, tachypnea, bradycardia, T max 102 F, evidence of end organ damange which includes hypoxia and acute renal failure. Additionally, WBC count 16 and evidence of pneumonia on CXR. - blood cultures positive for Klebsiella (source possible from recent UTI, urine culture from 11/26 positive for Klebsiella)  - started broad spectrum antibiotics and will continue the same regimen with Vancomycin and Maxipime day #3 - follow up on resp culture, legionella and strep pneumonia results which are all negative to date  - pt is partial code with no intubation requested unless determined to be temporarily needed  - pt still febrile with Tmax 100.5 F over the past 24 hours, WBC trending up, will  consult ID for further recommendations  - nebulizer treatments as needed for shortness of breath  - keep in SDU today   Active Problems: Chronic diastolic HF (heart failure) - last BNP in 12/2013 with BNP in 300-400 range. 2 D ECHO in 08/2013 showed EF 55%. - weight is up from admission and not sure if error: 136 lbs --> 151 lbs --> 143 lbs this AM - stopped IVF 12/06 - continue to monitor daily weights, strict I's and O's - continue Lasix 20 mg IV BID and readjust the regimen as indicated  HTN (hypertension) - all antihypertensives on hold on admission due to hypotension  - BP stabilizing and SBP in 160's this AM - resumed Coreg 12. 5 mg PO BID and Cardizem 12/06 - resume Imdur 12/07, continue Lasix as noted above  - continue to hold Lisinopril Leukocytosis - pt with known Lymphoma so wondering if this is contributing  - will d/w Dr. Alvy Bimler  PAF (paroxysmal atrial fibrillation) / Chronic anticoagulation- Pradaxa - on pradaxa but on hold due to worsening renal failure S/P AKA (above knee amputation) unilateral, Rt leg due to PAD and non healing wound / PAD (peripheral artery disease)- LICA stent 5/80 - stable - continue gabapentin  Stroke, acute, Rt brain, embolic March 9983 - pt is on pradaxa but temporarily put on hold due to worsening renal failure. 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 - hold targretin in the setting of an acute infection, sepsis ARF (acute renal failure)  - lisinopril and lasix may be contributing to renal failure - lasix has been resumed but continue to hold Lisinopril  - Cr trending down  - repeat BMP in AM Anemia of chronic disease - secondary to peripheral T cell lymphoma and chemotherapy  - hemoglobin is 11 on admission  and slightly down since admission, likely dilutional  - no signs of active bleeding  - no current indications for transfusion  GERD (gastroesophageal reflux disease) - continue Pepcid 40 mg daily  Hypokalemia - supplemented and WNL this AM Severe PCM - in the context of acute illness - advance diet as pt able to tolerate   DVT prophylaxis:  - enoxaparin subQ since admission   Code Status: Partial, no intubation  Family Communication: family at bedside this AM Disposition Plan: Remains inpatient   IV Access:    Peripheral IV Procedures and diagnostic studies:    CXR 08/17/2014 Persistent dense consolidative right upper lobe pneumonia. Background COPD/ emphysema.   CXR 08/16/2014 Increasing right upper lobe pneumonia.   CXR 08/16/2014 Right upper lobe pneumonia. Post treatment radiographs recommended to document resolution.  Medical Consultants:    None  Other Consultants:    Physical therapy  Anti-Infectives:    Vancomycin 12/04 -->  Maxipime 12/04 -->  [name], MD  [facility] Pager [phone]  If 7PM-7AM, please contact night-coverage [email or web address] Password [email or web address] 08/19/2014, 8:46 AM   LOS: 3 days   HPI/Subjective: No events overnight.   Objective: Filed Vitals:   08/19/14 0410 08/19/14 0500 08/19/14 0600 08/19/14 0800  BP:  192/89 157/87 170/75  Pulse: 73 85 107 97  Temp:   98.5 F (36.9 C) 98.1 F (36.7 C)  TempSrc:   Axillary Oral  Resp: _0 Height:      Weight:  65.1 kg (143 lb 8.3 oz)    SpO2: 98% 100% 99% 98%    Intake/Output Summary (Last 24 hours) at 08/19/14 0846 Last data filed at 08/19/14 0600  Gross per 24 hour  Intake    720 ml  Output   1385 ml  Net   -665 ml    Exam:   General:  Pt is alert, follows commands appropriately, not in acute distress, confused still   Cardiovascular: Irregular rate and rhythm, no rubs, no gallops  Respiratory: Clear to auscultation bilaterally, mild  bibasilar crackles with diminished breath sounds at bases   Abdomen: Soft, non tender, non distended, bowel sounds present, no guarding  Extremities: Right AKA  Data Reviewed: Basic Metabolic Panel:  Recent Labs Lab 08/16/14 1020 08/17/14 0350 08/18/14 0400 08/18/14 1314 08/19/14 0345  NA 135* 134* 141 144 147  K 3.7 3.5* 4.6 4.8 4.5  CL 91* 97 104 107 109  CO2 _1 GLUCOSE 148* 112* 107* 166* 118*  BUN 36* 41* 49* 50* 47*  CREATININE 1.70* 1.62* 1.59* 1.40* 1.23*  CALCIUM 9.5 8.4 8.8 9.2 9.4   Liver Function Tests:  Recent Labs Lab 08/16/14 1020 08/17/14 0350  AST 20 20  ALT 11 10  ALKPHOS 50 35*  BILITOT 0.8 0.6  PROT 6.9 6.0  ALBUMIN 3.0* 2.5*   CBC:  Recent Labs Lab 08/16/14 1124 08/17/14 0350 08/18/14 0400 08/19/14 0345  WBC 16.0* 15.7* 22.8* 24.1*  NEUTROABS 10.5*  --   --   --  HGB 11.0* 11.0* 10.2* 10.6*  HCT 32.8* 34.5* 31.6* 33.6*  MCV 92.1 94.0 94.3 94.6  PLT 201 195 197 181   CBG:  Recent Labs Lab 08/17/14 0751  GLUCAP 98    Recent Results (from the past 240 hour(s))  Blood culture (routine x 2)     Status: None   Collection Time: 08/16/14 10:20 AM  Result Value Ref Range Status   Culture  Setup Time   Final    KLEBSIELLA PNEUMONIAE   Report Status 08/18/2014 FINAL  Final   Organism ID, Bacteria KLEBSIELLA PNEUMONIAE  Final      Susceptibility   Klebsiella pneumoniae - MIC*    AMPICILLIN >=32 RESISTANT Resistant     AMPICILLIN/SULBACTAM 4 SENSITIVE Sensitive     CEFAZOLIN <=4 SENSITIVE Sensitive     CEFEPIME <=1 SENSITIVE Sensitive     CEFTAZIDIME <=1 SENSITIVE Sensitive     CEFTRIAXONE <=1 SENSITIVE Sensitive     CIPROFLOXACIN <=0.25 SENSITIVE Sensitive     GENTAMICIN <=1 SENSITIVE Sensitive     IMIPENEM <=0.25 SENSITIVE Sensitive     PIP/TAZO <=4 SENSITIVE Sensitive     TOBRAMYCIN <=1 SENSITIVE Sensitive     TRIMETH/SULFA <=20 SENSITIVE Sensitive     * KLEBSIELLA PNEUMONIAE  Blood culture (routine x 2)      Status: None   Collection Time: 08/16/14 10:25 AM  Result Value Ref Range Status   Specimen Description BLOOD LEFT ANTECUBITAL  Final   Culture  Setup Time   Final   Culture   Final    KLEBSIELLA PNEUMONIAE   Report Status 08/18/2014 FINAL  Final  MRSA PCR Screening     Status: None   Collection Time: 08/16/14  2:10 PM  Result Value Ref Range Status   MRSA by PCR NEGATIVE NEGATIVE Final  Culture, Urine     Status: None   Collection Time: 08/17/14 12:28 PM  Result Value Ref Range Status   Specimen Description URINE, RANDOM  Final   Colony Count NO GROWTH  Final   Report Status 08/18/2014 FINAL  Final     Scheduled Meds: . antiseptic oral rinse  7 mL Mouth Rinse BID  . carvedilol  12.5 mg Oral BID WC  . ceFEPime (MAXIPIME) IV  1 g Intravenous Q24H  . diltiazem  120 mg Oral Q breakfast  . enoxaparin (LOVENOX) injection  60 mg Subcutaneous Q24H  . famotidine  40 mg Oral Daily  . folic acid  1 mg Oral Q breakfast  . omega-3 acid ethyl esters  1 g Oral Daily  . polyethylene glycol  17 g Oral QODAY  . polyvinyl alcohol  1 drop Both Eyes BID  . sodium chloride  3 mL Intravenous Q12H  . triamcinolone cream   Topical UD  . vancomycin  1,000 mg Intravenous Q48H   Continuous Infusions:

## 2014-08-19 NOTE — Progress Notes (Signed)
ANTIBIOTIC CONSULT NOTE - follow up  Pharmacy Consult for vancomycin, cefepime Indication: sepsis/HCAP/bacteremia  Allergies  Allergen Reactions  . Chlorhexidine Dermatitis  . Evista [Raloxifene]     unknown  . Penicillins Other (See Comments)    unknown  . Triamterene Other (See Comments)    unknown  . Alendronate Sodium Rash    Patient Measurements: Height: 5' 2.3" (158.2 cm) Weight: 143 lb 8.3 oz (65.1 kg) IBW/kg (Calculated) : 50.79 Used previous weight of 64kg IBW 59kg for Ht 66in  Vital Signs: Temp: 98.1 F (36.7 C) (12/07 0800) Temp Source: Oral (12/07 0800) BP: 170/75 mmHg (12/07 0800) Pulse Rate: 97 (12/07 0800) Intake/Output from previous day: 12/06 0701 - 12/07 0700 In: 770 [P.O.:320; I.V.:200; IV Piggyback:250] Out: 1660 [Urine:1445] Intake/Output from this shift:    Labs:  Recent Labs  08/17/14 0350 08/18/14 0400 08/18/14 1314 08/19/14 0345  WBC 15.7* 22.8*  --  24.1*  HGB 11.0* 10.2*  --  10.6*  PLT 195 197  --  181  CREATININE 1.62* 1.59* 1.40* 1.23*   Estimated Creatinine Clearance: 25.5 mL/min (by C-G formula based on Cr of 1.23). No results for input(s): VANCOTROUGH, VANCOPEAK, VANCORANDOM, GENTTROUGH, GENTPEAK, GENTRANDOM, TOBRATROUGH, TOBRAPEAK, TOBRARND, AMIKACINPEAK, AMIKACINTROU, AMIKACIN in the last 72 hours.   Microbiology: Recent Results (from the past 720 hour(s))  Urine culture     Status: None   Collection Time: 08/03/14 12:10 AM  Result Value Ref Range Status   Specimen Description URINE, RANDOM  Final   Special Requests NONE  Final   Culture  Setup Time   Final    08/03/2014 14:56 Performed at [place]   Final    50,000 COLONIES/ML Performed at [facility]    Culture   Final    KLEBSIELLA PNEUMONIAE Performed at [facility]    Report Status 08/06/2014 FINAL  Final   Organism ID, Bacteria KLEBSIELLA PNEUMONIAE  Final      Susceptibility   Klebsiella pneumoniae - MIC*     AMPICILLIN >=32 RESISTANT Resistant     CEFAZOLIN <=4 SENSITIVE Sensitive     CEFTRIAXONE <=1 SENSITIVE Sensitive     CIPROFLOXACIN <=0.25 SENSITIVE Sensitive     GENTAMICIN <=1 SENSITIVE Sensitive     LEVOFLOXACIN <=0.12 SENSITIVE Sensitive     NITROFURANTOIN 64 INTERMEDIATE Intermediate     TOBRAMYCIN <=1 SENSITIVE Sensitive     TRIMETH/SULFA <=20 SENSITIVE Sensitive     PIP/TAZO <=4 SENSITIVE Sensitive     * KLEBSIELLA PNEUMONIAE  Blood culture (routine x 2)     Status: None   Collection Time: 08/16/14 10:20 AM  Result Value Ref Range Status   Specimen Description BLOOD RIGHT ANTECUBITAL  Final   Special Requests BOTTLES DRAWN AEROBIC AND ANAEROBIC 5ML  Final   Culture  Setup Time   Final    08/16/2014 12:39 Performed at [facility]    Culture   Final    KLEBSIELLA PNEUMONIAE Note: Gram Stain Report Called to,Read Back By and Verified With: TEVET WOODS RN [id number] 08/16/14 BY EDMOJ Performed at [facility]    Report Status 08/18/2014 FINAL  Final   Organism ID, Bacteria KLEBSIELLA PNEUMONIAE  Final      Susceptibility   Klebsiella pneumoniae - MIC*    AMPICILLIN >=32 RESISTANT Resistant     AMPICILLIN/SULBACTAM 4 SENSITIVE Sensitive     CEFAZOLIN <=4 SENSITIVE Sensitive     CEFEPIME <=1 SENSITIVE Sensitive  CEFTAZIDIME <=1 SENSITIVE Sensitive     CEFTRIAXONE <=1 SENSITIVE Sensitive     CIPROFLOXACIN <=0.25 SENSITIVE Sensitive     GENTAMICIN <=1 SENSITIVE Sensitive     IMIPENEM <=0.25 SENSITIVE Sensitive     PIP/TAZO <=4 SENSITIVE Sensitive     TOBRAMYCIN <=1 SENSITIVE Sensitive     TRIMETH/SULFA <=20 SENSITIVE Sensitive     * KLEBSIELLA PNEUMONIAE  Blood culture (routine x 2)     Status: None   Collection Time: 08/16/14 10:25 AM  Result Value Ref Range Status   Specimen Description BLOOD LEFT ANTECUBITAL  Final   Special Requests BOTTLES DRAWN AEROBIC AND ANAEROBIC 3ML  Final   Culture  Setup Time   Final    08/16/2014 12:39 Performed at  [facility]    Culture   Final    KLEBSIELLA PNEUMONIAE Note: SUSCEPTIBILITIES PERFORMED ON PREVIOUS CULTURE WITHIN THE LAST 5 DAYS. Note: Gram Stain Report Called to,Read Back By and Verified With: TEVET WOODS RN [id number] Performed at [facility]    Report Status 08/18/2014 FINAL  Final  MRSA PCR Screening     Status: None   Collection Time: 08/16/14  2:10 PM  Result Value Ref Range Status   MRSA by PCR NEGATIVE NEGATIVE Final    Comment:        The GeneXpert MRSA Assay (FDA approved for NASAL specimens only), is one component of a comprehensive MRSA colonization surveillance program. It is not intended to diagnose MRSA infection nor to guide or monitor treatment for MRSA infections.   Culture, Urine     Status: None   Collection Time: 08/17/14 12:28 PM  Result Value Ref Range Status   Specimen Description URINE, RANDOM  Final   Special Requests NONE  Final   Culture  Setup Time   Final    08/17/2014 20:01 Performed at [place] Performed at [facility]   Final   Culture NO GROWTH Performed at [facility]   Final   Report Status 08/18/2014 FINAL  Final    Medical History: Past Medical History  Diagnosis Date  . Hypertension   . Atrial fibrillation   . Psoriasis   . Congestive heart failure (CHF)   . History of CVA (cerebrovascular accident)   . Coronary artery disease   . Myocardial infarction   . CHF (congestive heart failure)   . Shortness of breath   . H/O hiatal hernia   . Closed fracture of head of left humerus 5/12  . Basal cell carcinoma   . 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  Flow cytometry 04/04/12: 91% cells CD4 positive CD 26 negative  . Hyperlipemia   . Pneumonia   . Wound of right leg 09/25/2012    Necrotic, open wound right  lower leg; non healing  . Stroke   . Hyposmolality and/or hyponatremia   . Lower limb amputation, above knee 10/11/2012  . Pain in joint, multiple sites   . Mycosis fungoides, unspecified site, extranodal and solid organ sites 09/28/2012  . Occlusion and stenosis of carotid artery without mention of cerebral infarction 09/28/2012  . Chronic venous hypertension with ulcer and inflammation 09/28/2012  . Diverticulosis of colon (without mention of hemorrhage) 09/28/2012  . Synovial cyst of popliteal space 03/26/2012  . Unspecified constipation 03/24/2012  .  Hypopotassemia 01/26/2012  . Debility, unspecified 01/17/2012  . Anxiety state, unspecified 12/31/2011  . Anemia, unspecified 12/21/2011  . Other specified disease of white blood cells 12/21/2011  . Peripheral vascular disease, unspecified 12/21/2011    recent ABI Lt of 0.64 down from ).80 on 12/29/11- though different labs  . Reflux esophagitis 12/21/2011  . Unspecified hereditary and idiopathic peripheral neuropathy 12/14/2011  . Pain in joint, ankle and foot 12/14/2011  . Carotid stenosis     CAROTID DOPPLER, 05/02/2012 - LEFT VERTEBRAL-occluded, LEFT BULB AND PROXIMAL ICA STENT-moderate amount of irregular mixed dense plaque 50-69% diameter reduction  . Pre-syncope     NUCLEAR STRESS TEST, 12/10/2009 - normal, EKG negative for ischemia  . TIA (transient ischemic attack)     2D ECHO, 12/05/2012 - EF 60-65%, Severely calcified annulus of the mitral valve with mild-moderate regurgitation, LA moderate-severely dilated  . Paroxysmal atrial fibrillation   . CAD in native artery     3 vessel CAD, medical therapy, not a candidate for CABG  . S/P AKA (above knee amputation) unilateral, Rt leg due to PAD and non healing wound 06/27/2013  . HCAP (healthcare-associated pneumonia) 01/08/2012  . 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  antiseptic oral rinse  7 mL Mouth Rinse BID  . carvedilol  12.5 mg Oral BID WC  . ceFEPime (MAXIPIME) IV  1 g Intravenous Q24H  . diltiazem  120 mg Oral Q breakfast  . enoxaparin (LOVENOX) injection  60 mg Subcutaneous Q24H  . famotidine  40 mg Oral Daily  . folic acid  1 mg Oral Q breakfast  . furosemide  20 mg Intravenous BID  . isosorbide mononitrate  30 mg Oral Q breakfast  . omega-3 acid ethyl esters  1 g Oral Daily  . polyethylene glycol  17 g Oral QODAY  . polyvinyl alcohol  1 drop Both Eyes BID  . sodium chloride  3 mL Intravenous Q12H  . triamcinolone cream   Topical UD  . vancomycin  1,000 mg Intravenous Q48H   Infusions:    Assessment: 80 yo female from [facility] presents to ER with fever, hemoptysis, and hypotension. Patient with hx chronic diastolic CHF, s/p right AKA, peripheral T cell lymphoma, PAF, HTN, GERD. To start cefepime and vancomycin per pharmacy for rule out sepsis. Note unknown allergy to PCN but patient has tolerated cefepime in past per records  12/4 >> vanc >> 12/4 >> cefepime >>  Tmax: 100.5 AM 12/7 WBCs: 24.1, increasing Renal: Scr 1.23, improving, est CrCl 26 ml/min  12/4 blood x2: klebsiella - resistant only to ampicillin 12/4 urine: NGF 12/4 sputum: not collected 12/4 flu: negative 12/4 strep urinary antigen: negative  Previous: 08/03/14 urine: klebsiella - resistant only to amp and intermediate to nitro  Today, 12/7: Day #4 Vanc/Cefepime for sepsis/HCAP/baceremia   Goal of Therapy:  Vancomycin trough level 15-20 mcg/ml  Plan:  1) Continue Vanc 1g IV q48 for now (2 doses given so far - 12/4 at 1045 and 12/6 at 0911) 2) Continue Cefepime 1g IV q24 per CrCl 10-30 ml/min 3) Will check vanc level with AM labs tomorrow to see if patient now clearing vanc quicker than anticipated due to continued improved renal funcation    Adrian Saran, PharmD, BCPS Pager [phone] 08/19/2014 9:55 AM

## 2014-08-19 NOTE — Progress Notes (Signed)
RT placed patient on BIPAP. Patient is tolerating well. RT to monitor and assess as needed. Q4 checks will be done.

## 2014-08-19 NOTE — Consult Note (Signed)
[facility]  Total days of antibiotics 4        Day 4 cefepime        Day 4 vanco               Reason for Consult: sepsis/gnr bacteremia and hcap    Referring Physician: Doyle Askew  Principal Problem:   Severe sepsis Active Problems:   Chronic diastolic HF (heart failure)   Stroke, acute, Rt brain, embolic March 2706   HTN (hypertension)   PAF (paroxysmal atrial fibrillation)   PAD (peripheral artery disease)- LICA stent 2/37   S/P AKA (above knee amputation) unilateral, Rt leg due to PAD and non healing wound   Leukocytosis   Chronic anticoagulation- Pradaxa   GERD (gastroesophageal reflux disease)   Hyperlipidemia   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 female with HTN, AFib, hx chronic psoriasis being treated for cutaneous T cell lymphoma, chronic diastolic HF who is a nursing home resident/wheelchair bound from right aka, dementia. Presented on 12/4 with fevers, malaise, and hypoxia of 86% pulse ox with mild tachypnea. She was found to be febrile to 102F. Wbc elevated at 16K, aki of Cr 1.6, CXR found to have RUL infiltrate with air bronchograms. She was started on vancomycin and cefepime. Blood cx grew kleb pneumonaie( pan sensitive, R amp) in 2 sets of blood cx on 12/4. Ur x NGTD. Since admit WBc still elevated at 24K, fever of tmax 100.5 in the last 24hr. She feels fatigue, dry mouth, and wants to eat  Past Medical History  Diagnosis Date  . Hypertension   . Atrial fibrillation   . Psoriasis   . Congestive heart failure (CHF)   . History of CVA (cerebrovascular accident)   . Coronary artery disease   . Myocardial infarction   . CHF (congestive heart failure)   . Shortness of breath   . H/O hiatal hernia   . Closed fracture of head of left humerus 5/12  . Basal  cell carcinoma   . 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  Flow cytometry 04/04/12: 91% cells CD4 positive CD 26 negative  . Hyperlipemia   . Pneumonia   . Wound of right leg 09/25/2012    Necrotic, open wound right lower leg; non healing  . Stroke   . Hyposmolality and/or hyponatremia   . Lower limb amputation, above knee 10/11/2012  . Pain in joint, multiple sites   . Mycosis fungoides, unspecified site, extranodal and solid organ sites 09/28/2012  . Occlusion and stenosis of carotid artery without mention of cerebral infarction 09/28/2012  . Chronic venous hypertension with ulcer and inflammation 09/28/2012  . Diverticulosis of colon (without mention of hemorrhage) 09/28/2012  . Synovial cyst of popliteal space 03/26/2012  . Unspecified constipation 03/24/2012  . Hypopotassemia 01/26/2012  . Debility, unspecified 01/17/2012  . Anxiety state, unspecified 12/31/2011  . Anemia, unspecified 12/21/2011  . Other specified disease of white blood cells 12/21/2011  . Peripheral vascular disease, unspecified 12/21/2011    recent ABI Lt of 0.64 down from ).80 on 12/29/11- though different labs  . Reflux esophagitis 12/21/2011  .  Unspecified hereditary and idiopathic peripheral neuropathy 12/14/2011  . Pain in joint, ankle and foot 12/14/2011  . Carotid stenosis     CAROTID DOPPLER, 05/02/2012 - LEFT VERTEBRAL-occluded, LEFT BULB AND PROXIMAL ICA STENT-moderate amount of irregular mixed dense plaque 50-69% diameter reduction  . Pre-syncope     NUCLEAR STRESS TEST, 12/10/2009 - normal, EKG negative for ischemia  . TIA (transient ischemic attack)     2D ECHO, 12/05/2012 - EF 60-65%, Severely calcified annulus of the mitral valve with mild-moderate regurgitation, LA moderate-severely dilated  . Paroxysmal atrial fibrillation   . CAD in native artery     3 vessel  CAD, medical therapy, not a candidate for CABG  . S/P AKA (above knee amputation) unilateral, Rt leg due to PAD and non healing wound 06/27/2013  . HCAP (healthcare-associated pneumonia) 01/08/2012  . 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 CTCL (cutaneous T-cell lymphoma) 08/16/2014    Allergies:  Allergies  Allergen Reactions  . Chlorhexidine Dermatitis  . Evista [Raloxifene]     unknown  . Penicillins Other (See Comments)    unknown  . Triamterene Other (See Comments)    unknown  . Alendronate Sodium Rash    MEDICATIONS: . antiseptic oral rinse  7 mL Mouth Rinse BID  . carvedilol  12.5 mg Oral BID WC  . ceFEPime (MAXIPIME) IV  1 g Intravenous Q24H  . diltiazem  120 mg Oral Q breakfast  . enoxaparin (LOVENOX) injection  60 mg Subcutaneous Q24H  . famotidine  20 mg Oral Daily  . feeding supplement (ENSURE)  1 Container Oral BID BM  . folic acid  1 mg Oral Q breakfast  . furosemide  20 mg Intravenous BID  . isosorbide mononitrate  30 mg Oral Q breakfast  . omega-3 acid ethyl esters  1 g Oral Daily  . polyethylene glycol  17 g Oral QODAY  . polyvinyl alcohol  1 drop Both Eyes BID  . sodium chloride  3 mL Intravenous Q12H  . triamcinolone cream   Topical UD    History  Substance Use Topics  . Smoking status: Never Smoker   . Smokeless tobacco: Never Used  . Alcohol Use: No    Family History  Problem Relation Age of Onset  . Stomach cancer Mother   . Pneumonia Father   . Cirrhosis Brother   . Diabetes Brother   . Heart attack Paternal Grandmother   . Heart failure Sister   . Diabetes Sister   . Heart failure Sister    Review of Systems  Constitutional: Negative for fever, chills, diaphoresis, activity change, appetite change, fatigue and unexpected weight change.  HENT: Negative for congestion, sore throat, rhinorrhea, sneezing, trouble swallowing and sinus pressure.  Eyes: Negative for photophobia and visual disturbance.    Respiratory: positive for cough, chest tightness, shortness of breath, wheezing and stridor.  Cardiovascular: Negative for chest pain, palpitations and leg swelling.  Gastrointestinal: Negative for nausea, vomiting, abdominal pain, diarrhea, constipation, blood in stool, abdominal distention and anal bleeding.  Genitourinary: Negative for dysuria, hematuria, flank pain and difficulty urinating.  Musculoskeletal: Negative for myalgias, back pain, joint swelling, arthralgias and gait problem.  Skin: Negative for color change, pallor, rash and wound.  Neurological: Negative for dizziness, tremors, weakness and light-headedness.  Hematological: Negative for adenopathy. Does not bruise/bleed easily.  Psychiatric/Behavioral: Negative for behavioral problems, confusion, sleep disturbance, dysphoric mood, decreased concentration and agitation.    OBJECTIVE: Temp:  [98.1 F (36.7 C)-100.5  F (38.1 C)] 98.3 F (36.8 C) (12/07 1200) Pulse Rate:  [51-124] 114 (12/07 1200) Resp:  [18-29] 28 (12/07 1200) BP: (120-192)/(62-89) 146/85 mmHg (12/07 1200) SpO2:  [95 %-100 %] 98 % (12/07 1200) FiO2 (%):  [40 %] 40 % (12/07 1030) Weight:  [143 lb 8.3 oz (65.1 kg)] 143 lb 8.3 oz (65.1 kg) (12/07 0500)  Constitutional:  oriented to person, place, and time. appears well-developed , frail elderly female wearing nasal cannula. Somewhat tearful wanting to eat HENT:  Mouth/Throat: Oropharynx is clear and moist. No oropharyngeal exudate.  Cardiovascular: Normal rate, regular rhythm and normal heart sounds. Exam reveals no gallop and no friction rub.  No murmur heard.  Pulmonary/Chest: Effort normal and breath sounds normal. No respiratory distress.  has no wheezes. Decrease BS upper lung fields Abdominal: Soft. Bowel sounds are normal.  exhibits no distension. There is no tenderness.  Lymphadenopathy: no cervical adenopathy.  Neurological: alert and oriented to person, place, and time.  Skin: Skin is palmar  erythema and dry flaky skin (patient states that this is normal for her). 2 large hematomas to left lateral calf/knee.  Psychiatric: a normal mood and affect.  behavior is normal.   LABS: Results for orders placed or performed during the hospital encounter of 08/16/14 (from the past 48 hour(s))  CBC     Status: Abnormal   Collection Time: 08/18/14  4:00 AM  Result Value Ref Range   WBC 22.8 (H) 4.0 - 10.5 K/uL   RBC 3.35 (L) 3.87 - 5.11 MIL/uL   Hemoglobin 10.2 (L) 12.0 - 15.0 g/dL   HCT 31.6 (L) 36.0 - 46.0 %   MCV 94.3 78.0 - 100.0 fL   MCH 30.4 26.0 - 34.0 pg   MCHC 32.3 30.0 - 36.0 g/dL   RDW 13.9 11.5 - 15.5 %   Platelets 197 150 - 400 K/uL  Basic metabolic panel     Status: Abnormal   Collection Time: 08/18/14  4:00 AM  Result Value Ref Range   Sodium 141 137 - 147 mEq/L    Comment: DELTA CHECK NOTED REPEATED TO VERIFY    Potassium 4.6 3.7 - 5.3 mEq/L    Comment: DELTA CHECK NOTED REPEATED TO VERIFY    Chloride 104 96 - 112 mEq/L   CO2 22 19 - 32 mEq/L   Glucose, Bld 107 (H) 70 - 99 mg/dL   BUN 49 (H) 6 - 23 mg/dL   Creatinine, Ser 1.59 (H) 0.50 - 1.10 mg/dL   Calcium 8.8 8.4 - 10.5 mg/dL   GFR calc non Af Amer 27 (L) >90 mL/min   GFR calc Af Amer 31 (L) >90 mL/min    Comment: (NOTE) The eGFR has been calculated using the CKD EPI equation. This calculation has not been validated in all clinical situations. eGFR's persistently <90 mL/min signify possible Chronic Kidney Disease.    Anion gap 15 5 - 15  Basic metabolic panel     Status: Abnormal   Collection Time: 08/18/14  1:14 PM  Result Value Ref Range   Sodium 144 137 - 147 mEq/L   Potassium 4.8 3.7 - 5.3 mEq/L   Chloride 107 96 - 112 mEq/L   CO2 21 19 - 32 mEq/L   Glucose, Bld 166 (H) 70 - 99 mg/dL   BUN 50 (H) 6 - 23 mg/dL   Creatinine, Ser 1.40 (H) 0.50 - 1.10 mg/dL   Calcium 9.2 8.4 - 10.5 mg/dL   GFR calc non Af Amer 31 (L) >  90 mL/min   GFR calc Af Amer 36 (L) >90 mL/min    Comment: (NOTE) The eGFR  has been calculated using the CKD EPI equation. This calculation has not been validated in all clinical situations. eGFR's persistently <90 mL/min signify possible Chronic Kidney Disease.    Anion gap 16 (H) 5 - 15  Basic metabolic panel     Status: Abnormal   Collection Time: 08/19/14  3:45 AM  Result Value Ref Range   Sodium 147 137 - 147 mEq/L   Potassium 4.5 3.7 - 5.3 mEq/L   Chloride 109 96 - 112 mEq/L   CO2 23 19 - 32 mEq/L   Glucose, Bld 118 (H) 70 - 99 mg/dL   BUN 47 (H) 6 - 23 mg/dL   Creatinine, Ser 1.23 (H) 0.50 - 1.10 mg/dL   Calcium 9.4 8.4 - 10.5 mg/dL   GFR calc non Af Amer 37 (L) >90 mL/min   GFR calc Af Amer 42 (L) >90 mL/min    Comment: (NOTE) The eGFR has been calculated using the CKD EPI equation. This calculation has not been validated in all clinical situations. eGFR's persistently <90 mL/min signify possible Chronic Kidney Disease.    Anion gap 15 5 - 15  CBC     Status: Abnormal   Collection Time: 08/19/14  3:45 AM  Result Value Ref Range   WBC 24.1 (H) 4.0 - 10.5 K/uL   RBC 3.55 (L) 3.87 - 5.11 MIL/uL   Hemoglobin 10.6 (L) 12.0 - 15.0 g/dL   HCT 33.6 (L) 36.0 - 46.0 %   MCV 94.6 78.0 - 100.0 fL   MCH 29.9 26.0 - 34.0 pg   MCHC 31.5 30.0 - 36.0 g/dL   RDW 13.8 11.5 - 15.5 %   Platelets 181 150 - 400 K/uL    MICRO: 12/04 blood cx x 2 : kleb pneumonaie ( R-amp, sensitive) 12/04 ur cx NGTD  IMAGING: Dg Chest Port 1 View  08/19/2014   CLINICAL DATA:  Shortness of breath.  Dyspnea.  EXAM: PORTABLE CHEST - 1 VIEW  COMPARISON:  One-view chest 08/17/2014.  FINDINGS: The heart is mildly enlarged. The hemidiaphragms are more obscured bilaterally. Moderate pulmonary vascular congestion is present on the right. Right upper lobe airspace disease is evident with some elevation of the minor fissure. Atherosclerotic changes are noted in the aortic arch.  IMPRESSION: 1. Persistent right upper lobe airspace disease. The persistent disease raise concern for a  central obstructing lesion. If this persists, CT of the chest with contrast should be considered to evaluate for an obstructing lesion. 2. Increasing obscuration of the hemidiaphragms bilaterally. This suggests small bilateral pleural effusions and bibasilar airspace disease, likely atelectasis.   Electronically Signed   By: Lawrence Santiago M.D.   On: 08/19/2014 09:13    Assessment/Plan: 80 yo F with chronic diastolic heart failure, presents with sepsis found to have obstructive RUL pneumonia and kleb pneumonaie bacteremia - continue with cefepime, will need 14 day of treatment.  - will discontinue vancomycin - continue with pulmonary toilet - will repeat blood cx to document clearance of bacteremia  Jazlyne Gauger B. [facility] [phone]

## 2014-08-19 NOTE — Progress Notes (Addendum)
[facility] for Enoxaparin Indication: atrial fibrillation  Allergies  Allergen Reactions  . Chlorhexidine Dermatitis  . Evista [Raloxifene]     unknown  . Penicillins Other (See Comments)    unknown  . Triamterene Other (See Comments)    unknown  . Alendronate Sodium Rash    Patient Measurements: Height: 5' 2.3" (158.2 cm) Weight: 143 lb 8.3 oz (65.1 kg) IBW/kg (Calculated) : 50.79 Heparin Dosing Weight:   Vital Signs: Temp: 98.3 F (36.8 C) (12/07 1200) Temp Source: Axillary (12/07 1200) BP: 162/99 mmHg (12/07 1400) Pulse Rate: 93 (12/07 1400)  Labs:  Recent Labs  08/17/14 0350 08/18/14 0400 08/18/14 1314 08/19/14 0345  HGB 11.0* 10.2*  --  10.6*  HCT 34.5* 31.6*  --  33.6*  PLT 195 197  --  181  CREATININE 1.62* 1.59* 1.40* 1.23*    Estimated Creatinine Clearance: 25.5 mL/min (by C-G formula based on Cr of 1.23).   Medical History: Past Medical History  Diagnosis Date  . Hypertension   . Atrial fibrillation   . Psoriasis   . Congestive heart failure (CHF)   . History of CVA (cerebrovascular accident)   . Coronary artery disease   . Myocardial infarction   . CHF (congestive heart failure)   . Shortness of breath   . H/O hiatal hernia   . Closed fracture of head of left humerus 5/12  . Basal cell carcinoma   . 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  Flow cytometry 04/04/12: 91% cells CD4 positive CD 26 negative  . Hyperlipemia   . Pneumonia   . Wound of right leg 09/25/2012    Necrotic, open wound right lower leg; non healing  . Stroke   . Hyposmolality and/or hyponatremia   . Lower limb amputation, above knee 10/11/2012  . Pain in joint, multiple sites   . Mycosis fungoides, unspecified site, extranodal and solid organ sites 09/28/2012  . Occlusion and stenosis of  carotid artery without mention of cerebral infarction 09/28/2012  . Chronic venous hypertension with ulcer and inflammation 09/28/2012  . Diverticulosis of colon (without mention of hemorrhage) 09/28/2012  . Synovial cyst of popliteal space 03/26/2012  . Unspecified constipation 03/24/2012  . Hypopotassemia 01/26/2012  . Debility, unspecified 01/17/2012  . Anxiety state, unspecified 12/31/2011  . Anemia, unspecified 12/21/2011  . Other specified disease of white blood cells 12/21/2011  . Peripheral vascular disease, unspecified 12/21/2011    recent ABI Lt of 0.64 down from ).80 on 12/29/11- though different labs  . Reflux esophagitis 12/21/2011  . Unspecified hereditary and idiopathic peripheral neuropathy 12/14/2011  . Pain in joint, ankle and foot 12/14/2011  . Carotid stenosis     CAROTID DOPPLER, 05/02/2012 - LEFT VERTEBRAL-occluded, LEFT BULB AND PROXIMAL ICA STENT-moderate amount of irregular mixed dense plaque 50-69% diameter reduction  . Pre-syncope     NUCLEAR STRESS TEST, 12/10/2009 - normal, EKG negative for ischemia  . TIA (transient ischemic attack)     2D ECHO, 12/05/2012 - EF 60-65%, Severely calcified annulus of the mitral valve with mild-moderate regurgitation, LA moderate-severely dilated  . Paroxysmal atrial fibrillation   . CAD in native artery     3 vessel CAD, medical therapy, not a candidate for CABG  . S/P AKA (above knee amputation) unilateral, Rt leg due to PAD and non healing wound 06/27/2013  .  HCAP (healthcare-associated pneumonia) 01/08/2012  . 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  She is also on chronic dabigatran therapy for paroxysmal atrial fibrillation.  She was noted to have ARF on admission with CrCl ~88ml/min and also on diltiazem (a moderate dual CYP-3A4 /p-gyp  inhibitor), so probably best to avoid Dabigatran until SCr improves.  Discussed with Dr. Charlies Silvers on admission and again with Dr. Doyle Askew today (08/19/14)--will use enoxaparin in meantime; although enoxaparin is also renally eliminated, we will at least be able to check anti-Xa levels if needed.    Last dose of dabigatran 12/3 at 8pm  CBC stable  Scr continues to improve, now 1.23 with est CrCl ~ 26 ml/min  No reported bleeding  Goal of Therapy:  Anti-Xa level 0.6-1 units/ml 4hrs after LMWH dose given Monitor platelets by anticoagulation protocol: Yes   Plan:   Patient refused Enoxaparin dose this AM, but per Dr. Doyle Askew okay to resume now.  Will restart Enoxaparin 1 mg/kg (= 65 mg due to updated weight) SQ q24h at 1600 today until at least CrCl > 30 ml/min.  Continue to hold Dabigatran until renal function improves.  Monitor renal function, CBC, and for signs/symptoms of bleeding.   Lindell Spar, PharmD, BCPS Pager: [phone] 08/19/2014 3:39 PM

## 2014-08-19 NOTE — Evaluation (Signed)
Clinical/Bedside Swallow Evaluation Patient Details  Name: Sara [name] MRN: [id number] Date of Birth: September 06, 1919  Today's Date: 08/19/2014 Time: 1040-1110 SLP Time Calculation (min) (ACUTE ONLY): 30 min  Past Medical History:  Past Medical History  Diagnosis Date  . Hypertension   . Atrial fibrillation   . Psoriasis   . Congestive heart failure (CHF)   . History of CVA (cerebrovascular accident)   . Coronary artery disease   . Myocardial infarction   . CHF (congestive heart failure)   . Shortness of breath   . H/O hiatal hernia   . Closed fracture of head of left humerus 5/12  . Basal cell carcinoma   . 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  Flow cytometry 04/04/12: 91% cells CD4 positive CD 26 negative  . Hyperlipemia   . Pneumonia   . Wound of right leg 09/25/2012    Necrotic, open wound right lower leg; non healing  . Stroke   . Hyposmolality and/or hyponatremia   . Lower limb amputation, above knee 10/11/2012  . Pain in joint, multiple sites   . Mycosis fungoides, unspecified site, extranodal and solid organ sites 09/28/2012  . Occlusion and stenosis of carotid artery without mention of cerebral infarction 09/28/2012  . Chronic venous hypertension with ulcer and inflammation 09/28/2012  . Diverticulosis of colon (without mention of hemorrhage) 09/28/2012  . Synovial cyst of popliteal space 03/26/2012  . Unspecified constipation 03/24/2012  . Hypopotassemia 01/26/2012  . Debility, unspecified 01/17/2012  . Anxiety state, unspecified 12/31/2011  . Anemia, unspecified 12/21/2011  . Other specified disease of white blood cells 12/21/2011  . Peripheral vascular disease, unspecified 12/21/2011    recent ABI Lt of 0.64 down from ).80 on 12/29/11- though different labs  . Reflux esophagitis 12/21/2011  . Unspecified hereditary and  idiopathic peripheral neuropathy 12/14/2011  . Pain in joint, ankle and foot 12/14/2011  . Carotid stenosis     CAROTID DOPPLER, 05/02/2012 - LEFT VERTEBRAL-occluded, LEFT BULB AND PROXIMAL ICA STENT-moderate amount of irregular mixed dense plaque 50-69% diameter reduction  . Pre-syncope     NUCLEAR STRESS TEST, 12/10/2009 - normal, EKG negative for ischemia  . TIA (transient ischemic attack)     2D ECHO, 12/05/2012 - EF 60-65%, Severely calcified annulus of the mitral valve with mild-moderate regurgitation, LA moderate-severely dilated  . Paroxysmal atrial fibrillation   . CAD in native artery     3 vessel CAD, medical therapy, not a candidate for CABG  . S/P AKA (above knee amputation) unilateral, Rt leg due to PAD and non healing wound 06/27/2013  . HCAP (healthcare-associated pneumonia) 01/08/2012  . 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 Tonsillectomy    . Dilation and curettage of uterus    . Eye surgery    . Cataracts    . Vertebroplasty    . Amputation  10/10/2012    Procedure: AMPUTATION ABOVE KNEE;  Surgeon: Newt Minion, MD;  Location: [facility];  Service: Orthopedics;  Laterality: Right;  . Percutaneous stent intervention  04/13/2011    Left internal carotid artery stented with a 8x30 prcise nitinol self-expanding stent resulting reduction of 90% to less than 20% residual  . Cardiac catheterization  11/06/2010  CABG recommended, turned down by [facility] secondary to co morbidities   HPI:  80 yo female adm to [facility] with severe sepsis - diagnosed with dense right upper lobe consolidation/pna.  Pt required Bipap for respiratory support.  She also has h/o GERD and old CVAs per previous imaging studies.  Swallow evaluation ordered.    Assessment / Plan / Recommendation Clinical Impression  Pt presents with aspiration risk primarily due  to her respiratory rate/status.  She becomes dyspneic with talking or any efforts which may allow episodic aspiration.  No focal CN deficits apparent but pt generally weak.    Pt without clinical indications of aspiration/airway compromise with single ice boluses, nectar via cup, applesauce or solids.  SLP did not test solids due to extent of respiratory distress with aspiration events this am and yesterday.  Suspect swallow overall is strong as pt without indications of oral or pharyngeal stasis.    Recommend to modify diet to mitigate aspiration risk but not eliminate it - pt and daughter informed.  Provided written handouts for education purposes.  Will follow up for tolerance and hopefully dietary advancement.  Do not recommend an MBS as do not anticipate will change pt's outcomes.       Aspiration Risk  Moderate    Diet Recommendation Dysphagia 3 (Mechanical Soft);Nectar-thick liquid;Ice chips PRN after oral care   Liquid Administration via: Cup;No straw Medication Administration: Whole meds with puree Supervision: Full supervision/cueing for compensatory strategies;Staff to assist with self feeding Compensations: Slow rate;Small sips/bites (rest if short of breath or coughing) Postural Changes and/or Swallow Maneuvers: Seated upright 90 degrees;Upright 30-60 min after meal    Other  Recommendations Oral Care Recommendations: Oral care BID Other Recommendations: Order thickener from pharmacy   Follow Up Recommendations  Skilled Nursing facility    Frequency and Duration min 2x/week  1 week   Pertinent Vitals/Pain Afebrile, decreased    Swallow Study Prior Functional Status   pt resident of [facility] Date of Onset: 08/19/14 HPI: 80 yo female adm to [facility] with severe sepsis - diagnosed with dense right upper lobe consolidation/pna.  Pt required Bipap for respiratory support.  She also has h/o GERD and old CVAs per previous imaging studies.  Swallow evaluation ordered.   Type of Study: Bedside swallow evaluation Previous Swallow Assessment: pt reports seeing "swallow therapy" at facility Diet Prior to this Study: Dysphagia 3 (soft);Thin liquids Temperature Spikes Noted: No Respiratory Status: Room air History of Recent Intubation: No Behavior/Cognition: Alert;Cooperative;Pleasant mood Oral Cavity - Dentition: Adequate natural dentition Self-Feeding Abilities: Total assist Patient Positioning: Upright in bed Baseline Vocal Quality: Low vocal intensity;Clear (pt with decreased respiratory support - able to get out 3 words per breath group) Volitional Cough: Strong Volitional Swallow: Able to elicit    Oral/Motor/Sensory Function Overall Oral Motor/Sensory Function: Appears within functional limits for tasks assessed (generalized weakness but no focal CN deficits) Velum:  (delayed but with stimulation elicited elevation bilaterally)   Ice Chips Ice chips: Within functional limits Presentation: Spoon   Thin Liquid Thin Liquid: Not tested Other Comments: due to overt coughing episodes earlier in the day and yesterday with thin water/milk per RN and pt's daughter    Nectar Thick Nectar Thick Liquid: Impaired Presentation: Cup;Spoon Oral Phase Impairments: Impaired anterior to posterior transit Oral phase functional implications: Prolonged oral transit;Oral holding   Honey Thick Honey Thick Liquid: Not tested   Puree Puree: Within functional limits Presentation: Spoon   Solid   GO  Solid: Within functional limits Other Comments: slow but effective mastication      Luanna Salk, [facility] SLP [phone]

## 2014-08-19 NOTE — Progress Notes (Signed)
Patient family member (daughter) at bedside. Notified of patient wishes this AM and how patient has been refusing care. Daughter has to leave for meeting and states another family member will be by to address these issues. Will continue to monitor

## 2014-08-19 NOTE — Progress Notes (Signed)
Patient is currently refusing all medications. Patient is currently alert and oriented x4. Patient stated. I don't want treatment, I don't want to wear Bipap, I just want to be with God. Are you (RN) going to give me something to be with GOD." Nursing tried to reassure patient to no avail. MD notified of situation. Family to be notified when they arrive.

## 2014-08-19 NOTE — Plan of Care (Signed)
Problem: Phase I Progression Outcomes Goal: Dyspnea controlled at rest Outcome: Progressing Goal: Pain controlled with appropriate interventions Outcome: Progressing Goal: OOB as tolerated unless otherwise ordered Outcome: Progressing Goal: Consider Infectious Disease Consult Outcome: Completed/Met Date Met:  08/19/14 Goal: Consider pulmonary consult Outcome: Progressing Goal: Voiding-avoid urinary catheter unless indicated Outcome: Completed/Met Date Met:  08/19/14 Goal: Hemodynamically stable Outcome: Completed/Met Date Met:  08/19/14

## 2014-08-19 NOTE — Progress Notes (Signed)
Patient sleeping at time of visit. Spoke with daughter. She talked about how little sleep her mother has had recently and how grateful she was to see her mother resting peacefully now. She also talked about her mother's cancer and how long she has lived with it. Offered spiritual and emotional support and provided information about support at the [facility]. Gave her my contact information.  Sara Gore, PhD, [place]

## 2014-08-19 NOTE — Progress Notes (Signed)
ANTICOAGULATION CONSULT NOTE - follow up  Pharmacy Consult for enoxaparin Indication: atrial fibrillation  Allergies  Allergen Reactions  . Chlorhexidine Dermatitis  . Evista [Raloxifene]     unknown  . Penicillins Other (See Comments)    unknown  . Triamterene Other (See Comments)    unknown  . Alendronate Sodium Rash    Patient Measurements: Height: 5' 2.3" (158.2 cm) Weight: 143 lb 8.3 oz (65.1 kg) IBW/kg (Calculated) : 50.79 Heparin Dosing Weight:   Vital Signs: Temp: 98.1 F (36.7 C) (12/07 0800) Temp Source: Oral (12/07 0800) BP: 170/75 mmHg (12/07 0800) Pulse Rate: 97 (12/07 0800)  Labs:  Recent Labs  08/17/14 0350 08/18/14 0400 08/18/14 1314 08/19/14 0345  HGB 11.0* 10.2*  --  10.6*  HCT 34.5* 31.6*  --  33.6*  PLT 195 197  --  181  CREATININE 1.62* 1.59* 1.40* 1.23*    Estimated Creatinine Clearance: 25.5 mL/min (by C-G formula based on Cr of 1.23).   Medical History: Past Medical History  Diagnosis Date  . Hypertension   . Atrial fibrillation   . Psoriasis   . Congestive heart failure (CHF)   . History of CVA (cerebrovascular accident)   . Coronary artery disease   . Myocardial infarction   . CHF (congestive heart failure)   . Shortness of breath   . H/O hiatal hernia   . Closed fracture of head of left humerus 5/12  . Basal cell carcinoma   . 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  Flow cytometry 04/04/12: 91% cells CD4 positive CD 26 negative  . Hyperlipemia   . Pneumonia   . Wound of right leg 09/25/2012    Necrotic, open wound right lower leg; non healing  . Stroke   . Hyposmolality and/or hyponatremia   . Lower limb amputation, above knee 10/11/2012  . Pain in joint, multiple sites   . Mycosis fungoides, unspecified site, extranodal and solid organ sites 09/28/2012  . Occlusion and stenosis  of carotid artery without mention of cerebral infarction 09/28/2012  . Chronic venous hypertension with ulcer and inflammation 09/28/2012  . Diverticulosis of colon (without mention of hemorrhage) 09/28/2012  . Synovial cyst of popliteal space 03/26/2012  . Unspecified constipation 03/24/2012  . Hypopotassemia 01/26/2012  . Debility, unspecified 01/17/2012  . Anxiety state, unspecified 12/31/2011  . Anemia, unspecified 12/21/2011  . Other specified disease of white blood cells 12/21/2011  . Peripheral vascular disease, unspecified 12/21/2011    recent ABI Lt of 0.64 down from ).80 on 12/29/11- though different labs  . Reflux esophagitis 12/21/2011  . Unspecified hereditary and idiopathic peripheral neuropathy 12/14/2011  . Pain in joint, ankle and foot 12/14/2011  . Carotid stenosis     CAROTID DOPPLER, 05/02/2012 - LEFT VERTEBRAL-occluded, LEFT BULB AND PROXIMAL ICA STENT-moderate amount of irregular mixed dense plaque 50-69% diameter reduction  . Pre-syncope     NUCLEAR STRESS TEST, 12/10/2009 - normal, EKG negative for ischemia  . TIA (transient ischemic attack)     2D ECHO, 12/05/2012 - EF 60-65%, Severely calcified annulus of the mitral valve with mild-moderate regurgitation, LA moderate-severely dilated  . Paroxysmal atrial fibrillation   . CAD in native artery     3 vessel CAD, medical therapy, not a candidate for CABG  . S/P AKA (above knee amputation) unilateral, Rt leg due to PAD and non healing wound  06/27/2013  . HCAP (healthcare-associated pneumonia) 01/08/2012  . 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  She is also on chronic dabigatran therapy for paroxysmal atrial fibrillation.  She is noted to have ARF at admission with CrCl ~76ml/min and also on diltiazem (a moderate dual CYP-3A4 /p-gyp  inhibitor) so probably best to avoid dabigtran until SCr improves.  Discussed with Dr. Charlies Silvers will use enoxaparin in meantime, although also renally eliminated will at least be able to check levels if became needed.    Last dose of dabigatran 12/3 8p  CBC: stable  Scr continues to improve now 1.23 with est CrCl 26 ml/min  Note patient still in Afib per documentation  No reported bleeding  Goal of Therapy:  Anti-Xa level 0.6-1 units/ml 4hrs after LMWH dose given Monitor platelets by anticoagulation protocol: Yes   Plan:   Continue Lovenox 60mg  SQ q24h until at least CrCl > 30 ml/min  CrCl still < 30 ml/min so continue to hold dabigatran 75mg  BID  Monitor for bleeding    Adrian Saran, PharmD, BCPS Pager [phone] 08/19/2014 10:05 AM

## 2014-08-19 NOTE — Progress Notes (Signed)
INITIAL NUTRITION ASSESSMENT  DOCUMENTATION CODES Per approved criteria  -Not Applicable   INTERVENTION: - Magic cup BID with meals, each supplement provides 290 kcal and 9 grams of protein - Ensure Pudding po BID, each supplement provides 170 kcal and 4 grams of protein  NUTRITION DIAGNOSIS: Inadequate oral intake related to dementia as evidenced by poor po intake.   Goal: Pt to meet >/= 90% of their estimated nutrition needs   Monitor:  Weight trend, po intake, acceptance of supplements, labs  Reason for Assessment: Low Braden Score  80 y.o. female  Admitting Dx: Severe sepsis  ASSESSMENT: 80 year old female with past medical history of PAD status post stenting (LICA [id number]/7564), right AKA, paroxysmal atrial fibrillation on AC with pradaxa,dementia, hypertension, chronic diastolic CHF (2 D ECHO in 08/2014 with EF of 55%, wheelchair bound who presented from [facility] to [facility] ED with reports of not feeling very well and reports of fevers. She is not a very good historian due to dementia.  - Pt reports that she feels hungry. Spoke with daughter who said that pt has had weight gain due to fluid retention. Weight was reported to be stable prior to admission. Pt normally has a good appetite. Pt does not like liquid supplements, she prefers puddings and ice creams. RD to order appropriate supplements. - Per RN, pt is awaiting swallow evaluation.  - No signs of fat or muscle wasting.   Height: Ht Readings from Last 1 Encounters:  08/16/14 5' 2.3" (1.582 m)    Weight: Wt Readings from Last 1 Encounters:  08/19/14 143 lb 8.3 oz (65.1 kg)    Ideal Body Weight: 50.79 kg  % Ideal Body Weight: 128%  Wt Readings from Last 10 Encounters:  08/19/14 143 lb 8.3 oz (65.1 kg)  02/21/14 142 lb (64.411 kg)  12/11/13 138 lb 1.6 oz (62.642 kg)  09/12/13 132 lb (59.875 kg)  08/30/13 138 lb 10.7 oz (62.9 kg)  08/02/13 135 lb (61.236 kg)  06/27/13 133 lb (60.328 kg)  01/16/13 131 lb 14.4  oz (59.829 kg)  12/19/12 124 lb (56.246 kg)  12/06/12 119 lb 4.8 oz (54.114 kg)    BMI:  Body mass index is 26.01 kg/(m^2).  Estimated Nutritional Needs: Kcal: 1500-1700 Protein: 80-90 g Fluid: 1.7 L/day  Skin: Intact  Diet Order: DIET SOFT  EDUCATION NEEDS: -Education needs addressed   Intake/Output Summary (Last 24 hours) at 08/19/14 1051 Last data filed at 08/19/14 0600  Gross per 24 hour  Intake    360 ml  Output   1325 ml  Net   -965 ml    Last BM: prior to admission   Labs:   Recent Labs Lab 08/18/14 0400 08/18/14 1314 08/19/14 0345  NA 141 144 147  K 4.6 4.8 4.5  CL 104 107 109  CO2 22 21 23   BUN 49* 50* 47*  CREATININE 1.59* 1.40* 1.23*  CALCIUM 8.8 9.2 9.4  GLUCOSE 107* 166* 118*    CBG (last 3)   Recent Labs  08/17/14 0751  GLUCAP 98    Scheduled Meds: . antiseptic oral rinse  7 mL Mouth Rinse BID  . carvedilol  12.5 mg Oral BID WC  . ceFEPime (MAXIPIME) IV  1 g Intravenous Q24H  . diltiazem  120 mg Oral Q breakfast  . enoxaparin (LOVENOX) injection  60 mg Subcutaneous Q24H  . famotidine  20 mg Oral Daily  . folic acid  1 mg Oral Q breakfast  . furosemide  20  mg Intravenous BID  . isosorbide mononitrate  30 mg Oral Q breakfast  . omega-3 acid ethyl esters  1 g Oral Daily  . polyethylene glycol  17 g Oral QODAY  . polyvinyl alcohol  1 drop Both Eyes BID  . sodium chloride  3 mL Intravenous Q12H  . triamcinolone cream   Topical UD  . vancomycin  1,000 mg Intravenous Q48H    Continuous Infusions:   Past Medical History  Diagnosis Date  . Hypertension   . Atrial fibrillation   . Psoriasis   . Congestive heart failure (CHF)   . History of CVA (cerebrovascular accident)   . Coronary artery disease   . Myocardial infarction   . CHF (congestive heart failure)   . Shortness of breath   . H/O hiatal hernia   . Closed fracture of head of left humerus 5/12  . Basal cell carcinoma   . Mycosis fungoides involving lymph nodes of  axilla and upper limb 05/29/2012    Diffuse cutaneous rash; desquamation skin palms & soles; WBC 15,000 50% lymphs; Hb 12' plattlets 244,000.  Flow cytometry 04/04/12: 91% cells CD4 positive CD 26 negative  . Hyperlipemia   . Pneumonia   . Wound of right leg 09/25/2012    Necrotic, open wound right lower leg; non healing  . Stroke   . Hyposmolality and/or hyponatremia   . Lower limb amputation, above knee 10/11/2012  . Pain in joint, multiple sites   . Mycosis fungoides, unspecified site, extranodal and solid organ sites 09/28/2012  . Occlusion and stenosis of carotid artery without mention of cerebral infarction 09/28/2012  . Chronic venous hypertension with ulcer and inflammation 09/28/2012  . Diverticulosis of colon (without mention of hemorrhage) 09/28/2012  . Synovial cyst of popliteal space 03/26/2012  . Unspecified constipation 03/24/2012  . Hypopotassemia 01/26/2012  . Debility, unspecified 01/17/2012  . Anxiety state, unspecified 12/31/2011  . Anemia, unspecified 12/21/2011  . Other specified disease of white blood cells 12/21/2011  . Peripheral vascular disease, unspecified 12/21/2011    recent ABI Lt of 0.64 down from ).80 on 12/29/11- though different labs  . Reflux esophagitis 12/21/2011  . Unspecified hereditary and idiopathic peripheral neuropathy 12/14/2011  . Pain in joint, ankle and foot 12/14/2011  . Carotid stenosis     CAROTID DOPPLER, 05/02/2012 - LEFT VERTEBRAL-occluded, LEFT BULB AND PROXIMAL ICA STENT-moderate amount of irregular mixed dense plaque 50-69% diameter reduction  . Pre-syncope     NUCLEAR STRESS TEST, 12/10/2009 - normal, EKG negative for ischemia  . TIA (transient ischemic attack)     2D ECHO, 12/05/2012 - EF 60-65%, Severely calcified annulus of the mitral valve with mild-moderate regurgitation, LA moderate-severely dilated  . Paroxysmal atrial fibrillation   . CAD in native artery     3 vessel CAD, medical therapy, not a candidate for CABG  . S/P AKA  (above knee amputation) unilateral, Rt leg due to PAD and non healing wound 06/27/2013  . HCAP (healthcare-associated pneumonia) 01/08/2012  . 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 Tonsillectomy    . Dilation and curettage of uterus    . Eye surgery    . Cataracts    . Vertebroplasty    . Amputation  10/10/2012    Procedure: AMPUTATION ABOVE KNEE;  Surgeon: Newt Minion, MD;  Location: [facility];  Service: Orthopedics;  Laterality: Right;  .  Percutaneous stent intervention  04/13/2011    Left internal carotid artery stented with a 8x30 prcise nitinol self-expanding stent resulting reduction of 90% to less than 20% residual  . Cardiac catheterization  11/06/2010    CABG recommended, turned down by [facility] secondary to co morbidities    Laurette Schimke MS, RD, LDN

## 2014-08-19 NOTE — Progress Notes (Addendum)
Since patient's daughter left, patient is again refusing medications. Patient alert and oriented. Nurse talked extensively with patient about importance of medications and how they were important to treatment. Daughter was called and nurse asked daughter to speak with patient about importance of taking medications. Patient is still unsure about taking medications and is refusing after talking on phone with daughter for several minutes. Patient wants reassurance from nurse that medication will be 100%  effective. Nurse said he couldn't say 100% that they would be effective but based on EPB this is the best course of treatment for making her well. Patient stating, "I want to go home or go be with God." Charge Nurse overheard conversation and tried to get patient to take her medications as well without any progress. Will continue to monitor. MD notified about patients refusal of meds

## 2014-08-20 LAB — BASIC METABOLIC PANEL
Anion gap: 13 (ref 5–15)
BUN: 49 mg/dL — AB (ref 6–23)
CALCIUM: 9.6 mg/dL (ref 8.4–10.5)
CO2: 25 mEq/L (ref 19–32)
CREATININE: 1.36 mg/dL — AB (ref 0.50–1.10)
Chloride: 107 mEq/L (ref 96–112)
GFR calc Af Amer: 38 mL/min — ABNORMAL LOW (ref >90)
GFR, EST NON AFRICAN AMERICAN: 32 mL/min — AB (ref >90)
Glucose, Bld: 173 mg/dL — ABNORMAL HIGH (ref 70–99)
Potassium: 3.9 mEq/L (ref 3.7–5.3)
Sodium: 145 mEq/L (ref 137–147)

## 2014-08-20 LAB — CBC
HEMATOCRIT: 30 % — AB (ref 36.0–46.0)
Hemoglobin: 9.5 g/dL — ABNORMAL LOW (ref 12.0–15.0)
MCH: 30 pg (ref 26.0–34.0)
MCHC: 31.7 g/dL (ref 30.0–36.0)
MCV: 94.6 fL (ref 78.0–100.0)
Platelets: 188 10*3/uL (ref 150–400)
RBC: 3.17 MIL/uL — ABNORMAL LOW (ref 3.87–5.11)
RDW: 14.1 % (ref 11.5–15.5)
WBC: 18.5 10*3/uL — ABNORMAL HIGH (ref 4.0–10.5)

## 2014-08-20 LAB — GLUCOSE, CAPILLARY: Glucose-Capillary: 137 mg/dL — ABNORMAL HIGH (ref 70–99)

## 2014-08-20 MED ORDER — ALBUTEROL SULFATE (2.5 MG/3ML) 0.083% IN NEBU
2.5000 mg | INHALATION_SOLUTION | Freq: Four times a day (QID) | RESPIRATORY_TRACT | Status: DC
  Administered 2014-08-20 – 2014-08-24 (×15): 2.5 mg via RESPIRATORY_TRACT
  Filled 2014-08-20 (×15): qty 3

## 2014-08-20 MED ORDER — SODIUM CHLORIDE 0.9 % IV SOLN
250.0000 mg | Freq: Three times a day (TID) | INTRAVENOUS | Status: DC
  Administered 2014-08-20 – 2014-08-27 (×21): 250 mg via INTRAVENOUS
  Filled 2014-08-20 (×23): qty 250

## 2014-08-20 MED ORDER — HYDRALAZINE HCL 20 MG/ML IJ SOLN
2.0000 mg | Freq: Once | INTRAMUSCULAR | Status: AC
  Administered 2014-08-20: 2 mg via INTRAVENOUS
  Filled 2014-08-20: qty 1

## 2014-08-20 MED ORDER — SENNOSIDES-DOCUSATE SODIUM 8.6-50 MG PO TABS
1.0000 | ORAL_TABLET | Freq: Two times a day (BID) | ORAL | Status: DC
  Administered 2014-08-20 – 2014-08-27 (×14): 1 via ORAL
  Filled 2014-08-20 (×15): qty 1

## 2014-08-20 MED ORDER — HYDRALAZINE HCL 20 MG/ML IJ SOLN
5.0000 mg | Freq: Once | INTRAMUSCULAR | Status: AC
  Administered 2014-08-20: 5 mg via INTRAVENOUS
  Filled 2014-08-20: qty 1

## 2014-08-20 NOTE — Progress Notes (Signed)
NUTRITION FOLLOW UP  Intervention:   -Recommend Dys 3 ground meats w/gravy/nectar thick liquids -Continue with current supplement regimen -Provided family with Dys3 nutrition education handout -RD to continue to monitor  Nutrition Dx:   Inadequate oral intake related to dementia as evidenced by poor po intake;ongoing  Goal:   Pt to meet >/= 90% of their estimated nutrition needs; progressign  Monitor:   Swallow profile, weight trend, po intake labs  Assessment:   12/07: - Pt reports that she feels hungry. Spoke with daughter who said that pt has had weight gain due to fluid retention. Weight was reported to be stable prior to admission. Pt normally has a good appetite. Pt does not like liquid supplements, she prefers puddings and ice creams. RD to order appropriate supplements. - Per RN, pt is awaiting swallow evaluation.  - No signs of fat or muscle wasting.   12/08: -SLP eval on 12/07 recd Dys 3 w/ground meats and nectar thick liquids -Received verbal consult from RN regarding pt dislike of current diet textures (dry foods, poor taste) -Discussed with SLP the addition of gravies, which SLP approved. RD modified diet in Epic and HealthTouch -Reviewed addition of nectar thick milk, gravies, salsa, melted cheese and brown sugar to food items to increase food acceptability  -Provided nutrition education handouts to daughter regarding Dys3 for additional information  Height: Ht Readings from Last 1 Encounters:  08/16/14 5' 2.3" (1.582 m)    Weight Status:   Wt Readings from Last 1 Encounters:  08/20/14 145 lb 11.6 oz (66.1 kg)    Re-estimated needs:  Kcal: 1500-1700 Protein: 80-90 g Fluid: 1.7 L/day  Skin: WDL  Diet Order: DIET DYS 3/ground meat/nectar thick   Intake/Output Summary (Last 24 hours) at 08/20/14 1413 Last data filed at 08/20/14 1000  Gross per 24 hour  Intake    440 ml  Output   1075 ml  Net   -635 ml    Last BM: 12/03   Labs:   Recent  Labs Lab 08/18/14 1314 08/19/14 0345 08/20/14 0331  NA 144 147 145  K 4.8 4.5 3.9  CL 107 109 107  CO2 21 23 25   BUN 50* 47* 49*  CREATININE 1.40* 1.23* 1.36*  CALCIUM 9.2 9.4 9.6  GLUCOSE 166* 118* 173*    CBG (last 3)   Recent Labs  08/20/14 0800  GLUCAP 137*    Scheduled Meds: . antiseptic oral rinse  7 mL Mouth Rinse BID  . carvedilol  12.5 mg Oral BID WC  . ceFEPime (MAXIPIME) IV  1 g Intravenous Q24H  . diltiazem  120 mg Oral Q breakfast  . enoxaparin (LOVENOX) injection  1 mg/kg Subcutaneous Q24H  . famotidine  20 mg Oral Daily  . feeding supplement (ENSURE)  1 Container Oral BID BM  . folic acid  1 mg Oral Q breakfast  . furosemide  20 mg Intravenous BID  . isosorbide mononitrate  30 mg Oral Q breakfast  . omega-3 acid ethyl esters  1 g Oral Daily  . polyethylene glycol  17 g Oral QODAY  . polyvinyl alcohol  1 drop Both Eyes BID  . senna-docusate  1 tablet Oral BID  . sodium chloride  3 mL Intravenous Q12H  . triamcinolone cream   Topical UD    Continuous Infusions:   Atlee Abide MS RD LDN Clinical Dietitian [phone]

## 2014-08-20 NOTE — Progress Notes (Signed)
Pt. BP is elevated 190/83. Labetalol IV 10mg  was given. BP drop to 176/ 92. At 0630 BP elevated again 191/72. N.P. Informed. And ordered to check BP again @0715 . Endorsed to A.M. RN.

## 2014-08-20 NOTE — Progress Notes (Addendum)
Patient ID: Sara [name], female   DOB: July 14, 1919, 80 y.o.   MRN: [id number] [facility] PROGRESS NOTE  Sara [name] SNK:[id number] DOB: 12/03/1918 DOA: 08/16/2014 PCP: Estill Dooms, MD  Brief narrative: 80 year old female with past medical history of PAD status post stenting (LICA 93/7902), right AKA, paroxysmal atrial fibrillation on AC with pradaxa,dementia, hypertension, chronic diastolic CHF (2 D ECHO in 08/2014 with EF of 55%, wheelchair bound who presented from [place] to [facility] ED with reports of not feeling very well and reports of fevers. She is not a very good historian due to dementia. Apparently no respiratory distress but on admission patient was found to have O2 saturation of 86%. No other complaints.  In ED, BP was 71/42 to 141/49, HR 59-91, RR 25 and T max 102 F. Oxygen saturation was 86% on room air, improved to 92% with [place] oxygen support. CXR consistent with right upper lung lobe pneumonia. She was started on broad spectrum abx, cefepime and vanco for sepsis and pneumonia. She is DNR and stable with current BP of 141/49 to go to telemetry floor.   Assessment and Plan:   Principal Problem: Severe sepsis / Leukocytosis / HCAP  - sepsis criteria met with initial vitals hypotension, tachypnea, bradycardia, T max 102 F, evidence of end organ damange which includes hypoxia and acute renal failure. Additionally, WBC count 16 and evidence of pneumonia on CXR. - blood cultures positive for Klebsiella (source possible from recent UTI, urine culture from 11/26 positive for Klebsiella)  - started broad spectrum antibiotics Vancomycin and Maxipime. Vanc d/c per ID 12/07 - pt still febrile with Tmax 100.5 F over the past 24 hours, WBC trending down  - pt is more dyspneic on exam this AM and CXR 12/7 worrisome for central obstructing lesion, will consult with PCCM  - nebulizer treatments as needed for shortness of breath  - keep in SDU  - d/w family consideration of  palliative care input, family to get back in touch with me with the decision   Active Problems: Chronic diastolic HF (heart failure) - last BNP in 12/2013 with BNP in 300-400 range. 2 D ECHO in 08/2013 showed EF 55%. - weight from admission: 136 lbs --> 151 lbs --> 143 lbs --> 145 this AM - stopped IVF 12/06 - pt started on Lasix 20 mg IV BID 12/6, hold today 12/8 as pt is euvolemic  - continue to monitor daily weights, strict I's and O's HTN (hypertension) - all antihypertensives on hold on admission due to hypotension  - BP stabilizing and SBP in 150's this AM - resumed Coreg 12. 5 mg PO BID and Cardizem 12/06 - resumed Imdur 12/07 - continue to hold Lisinopril Leukocytosis - pt with known Lymphoma so wondering if this is contributing  - will d/w Dr. Alvy Bimler  PAF (paroxysmal atrial fibrillation) / Chronic anticoagulation- Pradaxa - on pradaxa but on hold due to worsening renal failure, low GFR S/P AKA (above knee amputation) unilateral, Rt leg due to PAD and non healing wound / PAD (peripheral artery disease)- LICA stent 4/09 - stable - continue gabapentin  Stroke, acute, Rt brain, embolic March 7353 - pt is on pradaxa but temporarily on hold due to worsening renal failure. 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 - hold targretin in the setting of  an acute infection, sepsis ARF (acute renal failure)  - lisinopril and lasix may be contributing to renal failure - Cr trending up over the past 24 hours, continue to hold Lisinopril  - repeat BMP in AM Anemia of chronic disease - secondary to peripheral T cell lymphoma and chemotherapy  - hemoglobin is 11 on admission and slightly down since admission - no signs of active bleeding  - no current indications for transfusion  GERD (gastroesophageal reflux  disease) - continue Pepcid 40 mg daily  Hypokalemia - supplemented and WNL this AM Severe PCM - in the context of acute illness - advance diet as pt able to tolerate  - aspiration precautions  Functional quadriplegia - bed bound since admission - will attempt to get OOB to chair   DVT prophylaxis:  - enoxaparin subQ since admission   Code Status: Partial, no intubation  Family Communication: family at bedside this AM Disposition Plan: Remains inpatient   IV Access:    Peripheral IV Procedures and diagnostic studies:    CXR 08/19/2014  Persistent RUL airspace disease, ? central obstructing lesion. small pleural effusions, atelectasis.    CXR 08/17/2014 Persistent dense consolidative right upper lobe pneumonia. Background COPD/ emphysema.   CXR 08/16/2014 Increasing right upper lobe pneumonia.   CXR 08/16/2014 Right upper lobe pneumonia. Post treatment radiographs recommended to document resolution.  Medical Consultants:    PCCM  ID Other Consultants:    Physical therapy  Anti-Infectives:    Vancomycin 12/04 --> 12/07  Maxipime 12/04 -->  [name], MD  [facility] Pager [phone]  If 7PM-7AM, please contact night-coverage [email or web address] Password [facility] 08/20/2014, 11:44 AM   LOS: 4 days   HPI/Subjective: No events overnight.   Objective: Filed Vitals:   08/20/14 0800 08/20/14 0914 08/20/14 0953 08/20/14 1123  BP: 185/106 195/92 152/85   Pulse: 95 99 97 88  Temp: 98.4 F (36.9 C)     TempSrc: Axillary     Resp: _0 Height:      Weight:      SpO2: 99% 95% 95% 97%    Intake/Output Summary (Last 24 hours) at 08/20/14 1144 Last data filed at 08/20/14 0600  Gross per 24 hour  Intake    320 ml  Output   1300 ml  Net   -980 ml    Exam:   General:  Pt is alert, follows commands appropriately, not in acute distress  Cardiovascular: Irregular rate and rhythm, no rubs, no gallops  Respiratory: Diminished air  movement bilaterally, tachypnea with RR 27 bpm  Abdomen: Soft, non tender, non distended, bowel sounds present, no guarding  Extremities: R AKA, trace LLE edema   Neuro: Grossly nonfocal  Data Reviewed: Basic Metabolic Panel:  Recent Labs Lab 08/17/14 0350 08/18/14 0400 08/18/14 1314 08/19/14 0345 08/20/14 0331  NA 134* 141 144 147 145  K 3.5* 4.6 4.8 4.5 3.9  CL 97 104 107 109 107  CO2 _1 GLUCOSE 112* 107* 166* 118* 173*  BUN 41* 49* 50* 47* 49*  CREATININE 1.62* 1.59* 1.40* 1.23* 1.36*  CALCIUM 8.4 8.8 9.2 9.4 9.6   Liver Function Tests:  Recent Labs Lab 08/16/14 1020 08/17/14 0350  AST 20 20  ALT 11 10  ALKPHOS 50 35*  BILITOT 0.8 0.6  PROT 6.9 6.0  ALBUMIN 3.0* 2.5*   CBC:  Recent Labs Lab 08/16/14 1124 08/17/14 0350 08/18/14 0400 08/19/14 0345 08/20/14 0331  WBC 16.0* 15.7* 22.8* 24.1* 18.5*  NEUTROABS 10.5*  --   --   --   --   HGB 11.0* 11.0* 10.2* 10.6* 9.5*  HCT 32.8* 34.5* 31.6* 33.6* 30.0*  MCV 92.1 94.0 94.3 94.6 94.6  PLT 201 195 197 181 188   CBG:  Recent Labs Lab 08/17/14 0751 08/20/14 0800  GLUCAP 98 137*    Recent Results (from the past 240 hour(s))  Blood culture (routine x 2)     Status: None   Collection Time: 08/16/14 10:20 AM  Result Value Ref Range Status   Specimen Description BLOOD RIGHT ANTECUBITAL  Final   Special Requests BOTTLES DRAWN AEROBIC AND ANAEROBIC 5ML  Final   Culture  Setup Time   Final    08/16/2014 12:39 Performed at [facility]    Culture   Final    KLEBSIELLA PNEUMONIAE Note: Gram Stain Report Called to,Read Back By and Verified With: TEVET WOODS RN [id number] 08/16/14 BY EDMOJ Performed at [facility]    Report Status 08/18/2014 FINAL  Final   Organism ID, Bacteria KLEBSIELLA PNEUMONIAE  Final      Susceptibility   Klebsiella pneumoniae - MIC*    AMPICILLIN >=32 RESISTANT Resistant     AMPICILLIN/SULBACTAM 4 SENSITIVE Sensitive     CEFAZOLIN <=4 SENSITIVE  Sensitive     CEFEPIME <=1 SENSITIVE Sensitive     CEFTAZIDIME <=1 SENSITIVE Sensitive     CEFTRIAXONE <=1 SENSITIVE Sensitive     CIPROFLOXACIN <=0.25 SENSITIVE Sensitive     GENTAMICIN <=1 SENSITIVE Sensitive     IMIPENEM <=0.25 SENSITIVE Sensitive     PIP/TAZO <=4 SENSITIVE Sensitive     TOBRAMYCIN <=1 SENSITIVE Sensitive     TRIMETH/SULFA <=20 SENSITIVE Sensitive     * KLEBSIELLA PNEUMONIAE  Blood culture (routine x 2)     Status: None   Collection Time: 08/16/14 10:25 AM  Result Value Ref Range Status   Specimen Description BLOOD LEFT ANTECUBITAL  Final   Special Requests BOTTLES DRAWN AEROBIC AND ANAEROBIC 3ML  Final   Culture  Setup Time   Final    08/16/2014 12:39 Performed at [facility]    Culture   Final    KLEBSIELLA PNEUMONIAE Note: SUSCEPTIBILITIES PERFORMED ON PREVIOUS CULTURE WITHIN THE LAST 5 DAYS. Note: Gram Stain Report Called to,Read Back By and Verified With: TEVET WOODS RN [id number] Performed at [facility]    Report Status 08/18/2014 FINAL  Final  MRSA PCR Screening     Status: None   Collection Time: 08/16/14  2:10 PM  Result Value Ref Range Status   MRSA by PCR NEGATIVE NEGATIVE Final    Comment:        The GeneXpert MRSA Assay (FDA approved for NASAL specimens only), is one component of a comprehensive MRSA colonization surveillance program. It is not intended to diagnose MRSA infection nor to guide or monitor treatment for MRSA infections.   Culture, Urine     Status: None   Collection Time: 08/17/14 12:28 PM  Result Value Ref Range Status   Specimen Description URINE, RANDOM  Final   Special Requests NONE  Final   Culture  Setup Time   Final    08/17/2014 20:01 Performed at [place] Performed at [facility]   Final   Culture NO GROWTH Performed at [facility]   Final   Report Status 08/18/2014 FINAL  Final     Scheduled Meds: . antiseptic oral rinse  7 mL  Mouth Rinse BID  . carvedilol  12.5 mg Oral BID WC  . ceFEPime (MAXIPIME) IV  1 g Intravenous Q24H  . diltiazem  120 mg Oral Q breakfast  . enoxaparin (LOVENOX) injection  1 mg/kg Subcutaneous Q24H  . famotidine  20 mg Oral Daily  . feeding supplement (ENSURE)  1 Container Oral BID BM  . folic acid  1 mg Oral Q breakfast  . furosemide  20 mg Intravenous BID  . isosorbide mononitrate  30 mg Oral Q breakfast  . omega-3 acid ethyl esters  1 g Oral Daily  . polyethylene glycol  17 g Oral QODAY  . polyvinyl alcohol  1 drop Both Eyes BID  . senna-docusate  1 tablet Oral BID  . sodium chloride  3 mL Intravenous Q12H  . triamcinolone cream   Topical UD   Continuous Infusions:

## 2014-08-20 NOTE — Consult Note (Signed)
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 She was admitted to the SDU, placed on empiric abx and NIPPV.Culture data was positive for Klebsiella PNA. ID in 2/2 blood cultures. Infectious disease was consulted. She had made very slow progress clinically and radiographically. 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 Presented on 12/4 w/ CC:  fever from the SNF. Initial CXR showed RUL PNA. She was admitted to the SDU, placed on empiric abx and NIPPV. Culture data was sent. Culture data was positive for Klebsiella PNA. ID in 2/2 blood cultures. Infectious disease was consulted. She had made very slow progress clinically and radiographically. 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 (05/29/2012); Hyperlipemia; Pneumonia; Wound of right leg (09/25/2012); Stroke; Hyposmolality and/or hyponatremia; Lower  limb amputation, above knee (10/11/2012); Pain in joint, multiple sites; Mycosis fungoides, unspecified site, extranodal and solid organ sites (09/28/2012); Occlusion and stenosis of carotid artery without mention of cerebral infarction (09/28/2012); Chronic venous hypertension with ulcer and inflammation (09/28/2012); Diverticulosis of colon (without mention of hemorrhage) (09/28/2012); Synovial cyst of popliteal space (03/26/2012); Unspecified constipation (03/24/2012); Hypopotassemia (01/26/2012); Debility, unspecified (01/17/2012); Anxiety state, unspecified (12/31/2011); Anemia, unspecified (12/21/2011); Other specified disease of white blood cells (12/21/2011); Peripheral vascular disease, unspecified (12/21/2011); Reflux esophagitis (12/21/2011); Unspecified hereditary and idiopathic peripheral neuropathy (12/14/2011); Pain in joint, ankle and foot (12/14/2011); Carotid stenosis; Pre-syncope; TIA (transient ischemic attack); Paroxysmal atrial fibrillation; CAD in native artery; S/P AKA (above knee amputation) unilateral, Rt leg due to PAD and non healing wound (06/27/2013); HCAP (healthcare-associated pneumonia) (01/08/2012); 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  has past surgical history that includes Tonsillectomy; Dilation and curettage of uterus; Eye surgery; cataracts; Vertebroplasty; Amputation (10/10/2012); Percutaneous stent intervention (04/13/2011); and Cardiac catheterization (11/06/2010). Prior to Admission medications   Medication Sig Start Date End Date Taking? Authorizing Provider  acetaminophen (TYLENOL) 325 MG tablet Take 650 mg by mouth once.   Yes Historical Provider, MD  atorvastatin (LIPITOR) 40 MG tablet Take 1 tablet (40 mg total) by mouth daily at 6 PM. 12/05/12  Yes Nita Sells, MD  bexarotene (TARGRETIN) 75 MG CAPS capsule Take 4 capsules (300 mg total) by mouth daily before supper. Give with  food. Protect from light. CAUTION: Chemotherapy/Biotherapy-Wear gloves when handling 06/04/14 09/04/14 Yes Heath Lark, MD  carvedilol (COREG)  12.5 MG tablet Take 1 tablet (12.5 mg total) by mouth 2 (two) times daily with a meal. 08/30/13  Yes Janece Canterbury, MD  dabigatran (PRADAXA) 75 MG CAPS capsule Take 75 mg by mouth every 12 (twelve) hours.   Yes Historical Provider, MD  diltiazem (CARDIZEM CD) 120 MG 24 hr capsule Take 120 mg by mouth daily with breakfast.    Yes Historical Provider, MD  famotidine (PEPCID) 40 MG tablet Take 40 mg by mouth daily. At 4pm.   Yes Historical Provider, MD  folic acid (FOLVITE) 1 MG tablet Take 1 mg by mouth daily with breakfast.    Yes Historical Provider, MD  furosemide (LASIX) 40 MG tablet Take 1.5 tablets (60 mg total) by mouth 2 (two) times daily. 08/30/13  Yes Janece Canterbury, MD  furosemide (LASIX) 40 MG tablet Take 40 mg by mouth daily as needed for fluid. 1 tab daily as needed for weight gain of 2-3 lbs in 24 hours or 3-5lbs in 1 week.   Yes Historical Provider, MD  gabapentin (NEURONTIN) 100 MG capsule Take 1 capsule (100 mg total) by mouth at bedtime. 12/05/12  Yes Nita Sells, MD  guaiFENesin (MUCINEX) 600 MG 12 hr tablet Take 600 mg by mouth 2 (two) times daily.    Yes Historical Provider, MD  HYDROcodone-acetaminophen (NORCO/VICODIN) 5-325 MG per tablet Take 1 tablet by mouth every 6 (six) hours as needed for moderate pain.   Yes Historical Provider, MD  ipratropium-albuterol (DUONEB) 0.5-2.5 (3) MG/3ML SOLN Take 3 mLs by nebulization every 6 (six) hours as needed (for shortness of breath and wheezing).    Yes Historical Provider, MD  isosorbide mononitrate (IMDUR) 30 MG 24 hr tablet Take 1 tablet (30 mg total) by mouth daily. Patient taking differently: Take 30 mg by mouth daily with breakfast.  06/27/13  Yes Isaiah Serge, NP  levocetirizine (XYZAL) 5 MG tablet Take 5 mg by mouth daily as needed for allergies.    Yes Historical Provider, MD   lisinopril (PRINIVIL,ZESTRIL) 5 MG tablet Take 5 mg by mouth daily with breakfast.    Yes Historical Provider, MD  nitroGLYCERIN (NITROSTAT) 0.4 MG SL tablet Place 0.4 mg under the tongue every 5 (five) minutes as needed for chest pain. If no relief call MD.   Yes Historical Provider, MD  Omega-3 Fatty Acids (FISH OIL) 1000 MG CAPS Take 1,000 mg by mouth at bedtime.    Yes Historical Provider, MD  polyethylene glycol (MIRALAX / GLYCOLAX) packet Take 17 g by mouth every other day. Take 1 packet mixed in 4 oz of beverage every day to relieve constipation.   Yes Historical Provider, MD  Polyvinyl Alcohol-Povidone (REFRESH OP) Apply 1 drop to eye 4 (four) times daily. Instill 1 drop to both eyes four times daily.*Wait 3-5 minutes between eye drops*   Yes Historical Provider, MD  triamcinolone cream (KENALOG) 0.1 % Apply topically as directed. 09/25/13  Yes Historical Provider, MD   Allergies  Allergen Reactions  . Chlorhexidine Dermatitis  . Evista [Raloxifene]     unknown  . Penicillins Other (See Comments)    unknown  . Triamterene Other (See Comments)    unknown  . Alendronate Sodium Rash    FAMILY HISTORY:  indicated that her mother is deceased. She indicated that her father is deceased. She indicated that all of her three sisters are deceased. She indicated that her brother is deceased. She indicated that her maternal grandmother is deceased. She indicated that her maternal grandfather is deceased.  She indicated that her paternal grandmother is deceased. She indicated that her paternal grandfather is deceased. She indicated that both of her daughters are alive.  SOCIAL HISTORY:  reports that she has never smoked. She has never used smokeless tobacco. She reports that she does not drink alcohol or use illicit drugs.  REVIEW OF SYSTEMS:  Unable   SUBJECTIVE:  No acute c/o   VITAL SIGNS: Temp:  [98.4 F (36.9 C)-100.5 F (38.1 C)] 98.4 F (36.9 C) (12/08 0800) Pulse Rate:  [62-156]  105 (12/08 1200) Resp:  [22-34] 27 (12/08 1200) BP: (112-195)/(52-106) 112/52 mmHg (12/08 1200) SpO2:  [95 %-99 %] 97 % (12/08 1200) Weight:  [66.1 kg (145 lb 11.6 oz)] 66.1 kg (145 lb 11.6 oz) (12/08 0400) HEMODYNAMICS:   VENTILATOR SETTINGS:   INTAKE / OUTPUT:  Intake/Output Summary (Last 24 hours) at 08/20/14 1259 Last data filed at 08/20/14 1000  Gross per 24 hour  Intake    440 ml  Output   1300 ml  Net   -860 ml    PHYSICAL EXAMINATION: General:  80 year old female, resting comfortably on NIPPV Neuro:  Awake, generalized weakness. Follows commands  HEENT:  BIPAP mask in place  Cardiovascular:  rrr Lungs:  Basilar rales, + accessory muscle use when off BIPAP  Abdomen:  Soft, non-tender  Musculoskeletal:  Intact  Skin:  Intact   LABS:  CBC  Recent Labs Lab 08/18/14 0400 08/19/14 0345 08/20/14 0331  WBC 22.8* 24.1* 18.5*  HGB 10.2* 10.6* 9.5*  HCT 31.6* 33.6* 30.0*  PLT 197 181 188   Coag's No results for input(s): APTT, INR in the last 168 hours. BMET  Recent Labs Lab 08/18/14 1314 08/19/14 0345 08/20/14 0331  NA 144 147 145  K 4.8 4.5 3.9  CL 107 109 107  CO2 21 23 25   BUN 50* 47* 49*  CREATININE 1.40* 1.23* 1.36*  GLUCOSE 166* 118* 173*   Electrolytes  Recent Labs Lab 08/18/14 1314 08/19/14 0345 08/20/14 0331  CALCIUM 9.2 9.4 9.6   Sepsis Markers  Recent Labs Lab 08/16/14 1030 08/16/14 2108  LATICACIDVEN 2.72* 1.8   ABG  Recent Labs Lab 08/16/14 1807  PHART 7.284*  PCO2ART 54.9*  PO2ART 72.1*   Liver Enzymes  Recent Labs Lab 08/16/14 1020 08/17/14 0350  AST 20 20  ALT 11 10  ALKPHOS 50 35*  BILITOT 0.8 0.6  ALBUMIN 3.0* 2.5*   Cardiac Enzymes  Recent Labs Lab 08/16/14 1020  PROBNP 19573.0*   Glucose  Recent Labs Lab 08/17/14 0751 08/20/14 0800  GLUCAP 98 137*    Imaging Dg Chest Port 1 View  08/19/2014   CLINICAL DATA:  Shortness of breath.  Dyspnea.  EXAM: PORTABLE CHEST - 1 VIEW  COMPARISON:   One-view chest 08/17/2014.  FINDINGS: The heart is mildly enlarged. The hemidiaphragms are more obscured bilaterally. Moderate pulmonary vascular congestion is present on the right. Right upper lobe airspace disease is evident with some elevation of the minor fissure. Atherosclerotic changes are noted in the aortic arch.  IMPRESSION: 1. Persistent right upper lobe airspace disease. The persistent disease raise concern for a central obstructing lesion. If this persists, CT of the chest with contrast should be considered to evaluate for an obstructing lesion. 2. Increasing obscuration of the hemidiaphragms bilaterally. This suggests small bilateral pleural effusions and bibasilar airspace disease, likely atelectasis.   Electronically Signed   By: Lawrence Santiago M.D.   On: 08/19/2014 09:13     ASSESSMENT /  PLAN:  PULMONARY OETT A: acute respiratory failure in the setting of HCAP vs aspiration  P:   Cont NIPPV Scheduled BDs Avoid sedating meds  F/u PCXR in am    CARDIOVASCULAR CVL A:  Sepsis in the setting of bacteremia  Chronic diastolic HF HTN PAF  P:  Cont tele monitoring  Keep euvolemic at this point   RENAL A:  AKI: scr had improved, now worse.  P:   Avoid hypotension in setting of active diuresis  Renal dose meds F/u chemistry Hold further lasix for now  GASTROINTESTINAL A:   Possible dysphagia: worried that she likely has aspirated. ? Exacerbated by sepsis and AMS P:   Aspiration precautions  PPI   HEMATOLOGIC A:   H/o T cell lymphoma of multiple LNs w/ multiple sites  Anemia of chronic illness  P:  Trend cbc [place] LMWH  INFECTIOUS A:   Sepsis w/ klebsiella bacteremia, likely from UTI 11/21.  Aspiration vs HCAP  P:   BCx2  12/6: Klebsiella pneumoniae- R to ampi UC 12/5: neg, 11/21 klebsiella  maxipime 12/4>>> Defer to ID   ENDOCRINE A:   No acute  P:   Trend glucose   NEUROLOGIC A:  Acute encephalopathy  P:   RASS goal: -0 D/c sedating  meds Supportive care    FAMILY  - Update: family updated at length at bedside   - Inter-disciplinary family meet or Palliative Care meeting due by: 12/15  Erick Colace ACNP-BC [place] Pager # [phone] OR # [phone] if no answer  ATTENDING NOTE: I have personally reviewed patient's available data, including medical history, events of note, physical examination and test results as part of my evaluation. I have discussed with resident/NP and other careteam providers such as pharmacist, RN and RRT & co-ordinated with consultants. In addition, I personally evaluated patient and elicited key history of nursing home resident, PAD, chronic diastolic CHF, recent UTI that was incompletely treated, now admitted with right upper lobe pneumonia and hypoxia, exam findings of respiratory distress on BiPAP, crackles right suprascapular, fevers resolved & labs showing improved leukocytosis, decreasing creatinine.     TODAY'S MD SUMMARY: Seems like Klebsiella bacteremia was related to UTI 11/21  (same organism), right upper lobe dense consolidation may be a result of aspiration or a second process, doubt that she has a gram-negative pneumonia   Rest per NP/medical resident whose note is outlined above and that I agree with and edited in full. Spoke to daughter at length at the bedside-although she was DO NOT RESUSCITATE in the past, they do request short-term intubation if required.  The patient is critically ill with multiple organ systems failure and requires high complexity decision making for assessment and support, frequent evaluation and titration of therapies, application of advanced monitoring technologies and extensive interpretation of multiple databases. Critical Care Time devoted to patient care services described in this note independent of APP time is 45 minutes.    Rigoberto Noel MD 08/20/2014, 12:59 PM

## 2014-08-20 NOTE — Progress Notes (Signed)
SLP Cancellation Note  Patient Details Name: Sara [name] MRN: [id number] DOB: [birth year]   Cancelled treatment:       Reason Eval/Treat Not Completed: Medical issues which prohibited therapy (pt on Bipap currently due to respiratory issues, note plan for possible palliative meeting).  SLP had educated pt and daughter to chronicity of aspiration risk and mitigation strategies/diet modifiers yesterday.    Luanna Salk, [place] [facility] SLP [phone]

## 2014-08-20 NOTE — Progress Notes (Addendum)
ANTIBIOTIC CONSULT NOTE - initial  Pharmacy Consult for Primaxin Indication: sepsis/HCAP/bacteremia  Allergies  Allergen Reactions  . Chlorhexidine Dermatitis  . Evista [Raloxifene]     unknown  . Penicillins Other (See Comments)    unknown  . Triamterene Other (See Comments)    unknown  . Alendronate Sodium Rash    Patient Measurements: Height: 5' 2.3" (158.2 cm) Weight: 145 lb 11.6 oz (66.1 kg) IBW/kg (Calculated) : 50.79 Used previous weight of 64kg IBW 59kg for Ht 66in  Vital Signs: Temp: 98.4 F (36.9 C) (12/08 0800) Temp Source: Axillary (12/08 0800) BP: 112/52 mmHg (12/08 1200) Pulse Rate: 105 (12/08 1200) Intake/Output from previous day: 12/07 0701 - 12/08 0700 In: 320 [P.O.:270; IV Piggyback:50] Out: 8115 [Urine:1575] Intake/Output from this shift: Total I/O In: 120 [P.O.:120] Out: -   Labs:  Recent Labs  08/18/14 0400 08/18/14 1314 08/19/14 0345 08/20/14 0331  WBC 22.8*  --  24.1* 18.5*  HGB 10.2*  --  10.6* 9.5*  PLT 197  --  181 188  CREATININE 1.59* 1.40* 1.23* 1.36*   Estimated Creatinine Clearance: 23.2 mL/min (by C-G formula based on Cr of 1.36). No results for input(s): VANCOTROUGH, VANCOPEAK, VANCORANDOM, GENTTROUGH, GENTPEAK, GENTRANDOM, TOBRATROUGH, TOBRAPEAK, TOBRARND, AMIKACINPEAK, AMIKACINTROU, AMIKACIN in the last 72 hours.   Microbiology: Recent Results (from the past 720 hour(s))  Urine culture     Status: None   Collection Time: 08/03/14 12:10 AM  Result Value Ref Range Status   Specimen Description URINE, RANDOM  Final   Special Requests NONE  Final   Culture  Setup Time   Final    08/03/2014 14:56 Performed at [place]   Final    50,000 COLONIES/ML Performed at [facility]    Culture   Final    KLEBSIELLA PNEUMONIAE Performed at [facility]    Report Status 08/06/2014 FINAL  Final   Organism ID, Bacteria KLEBSIELLA PNEUMONIAE  Final      Susceptibility   Klebsiella  pneumoniae - MIC*    AMPICILLIN >=32 RESISTANT Resistant     CEFAZOLIN <=4 SENSITIVE Sensitive     CEFTRIAXONE <=1 SENSITIVE Sensitive     CIPROFLOXACIN <=0.25 SENSITIVE Sensitive     GENTAMICIN <=1 SENSITIVE Sensitive     LEVOFLOXACIN <=0.12 SENSITIVE Sensitive     NITROFURANTOIN 64 INTERMEDIATE Intermediate     TOBRAMYCIN <=1 SENSITIVE Sensitive     TRIMETH/SULFA <=20 SENSITIVE Sensitive     PIP/TAZO <=4 SENSITIVE Sensitive     * KLEBSIELLA PNEUMONIAE  Blood culture (routine x 2)     Status: None   Collection Time: 08/16/14 10:20 AM  Result Value Ref Range Status   Specimen Description BLOOD RIGHT ANTECUBITAL  Final   Special Requests BOTTLES DRAWN AEROBIC AND ANAEROBIC 5ML  Final   Culture  Setup Time   Final    08/16/2014 12:39 Performed at [facility]    Culture   Final    KLEBSIELLA PNEUMONIAE Note: Gram Stain Report Called to,Read Back By and Verified With: TEVET WOODS RN [id number] 08/16/14 BY EDMOJ Performed at [facility]    Report Status 08/18/2014 FINAL  Final   Organism ID, Bacteria KLEBSIELLA PNEUMONIAE  Final      Susceptibility   Klebsiella pneumoniae - MIC*    AMPICILLIN >=32 RESISTANT Resistant     AMPICILLIN/SULBACTAM 4 SENSITIVE Sensitive     CEFAZOLIN <=4 SENSITIVE Sensitive     CEFEPIME <=1 SENSITIVE Sensitive  CEFTAZIDIME <=1 SENSITIVE Sensitive     CEFTRIAXONE <=1 SENSITIVE Sensitive     CIPROFLOXACIN <=0.25 SENSITIVE Sensitive     GENTAMICIN <=1 SENSITIVE Sensitive     IMIPENEM <=0.25 SENSITIVE Sensitive     PIP/TAZO <=4 SENSITIVE Sensitive     TOBRAMYCIN <=1 SENSITIVE Sensitive     TRIMETH/SULFA <=20 SENSITIVE Sensitive     * KLEBSIELLA PNEUMONIAE  Blood culture (routine x 2)     Status: None   Collection Time: 08/16/14 10:25 AM  Result Value Ref Range Status   Specimen Description BLOOD LEFT ANTECUBITAL  Final   Special Requests BOTTLES DRAWN AEROBIC AND ANAEROBIC 3ML  Final   Culture  Setup Time   Final    08/16/2014  12:39 Performed at [facility]    Culture   Final    KLEBSIELLA PNEUMONIAE Note: SUSCEPTIBILITIES PERFORMED ON PREVIOUS CULTURE WITHIN THE LAST 5 DAYS. Note: Gram Stain Report Called to,Read Back By and Verified With: TEVET WOODS RN [id number] Performed at [facility]    Report Status 08/18/2014 FINAL  Final  MRSA PCR Screening     Status: None   Collection Time: 08/16/14  2:10 PM  Result Value Ref Range Status   MRSA by PCR NEGATIVE NEGATIVE Final    Comment:        The GeneXpert MRSA Assay (FDA approved for NASAL specimens only), is one component of a comprehensive MRSA colonization surveillance program. It is not intended to diagnose MRSA infection nor to guide or monitor treatment for MRSA infections.   Culture, Urine     Status: None   Collection Time: 08/17/14 12:28 PM  Result Value Ref Range Status   Specimen Description URINE, RANDOM  Final   Special Requests NONE  Final   Culture  Setup Time   Final    08/17/2014 20:01 Performed at [place] Performed at [facility]   Final   Culture NO GROWTH Performed at [facility]   Final   Report Status 08/18/2014 FINAL  Final    Medical History: Past Medical History  Diagnosis Date  . Hypertension   . Atrial fibrillation   . Psoriasis   . Congestive heart failure (CHF)   . History of CVA (cerebrovascular accident)   . Coronary artery disease   . Myocardial infarction   . CHF (congestive heart failure)   . Shortness of breath   . H/O hiatal hernia   . Closed fracture of head of left humerus 5/12  . Basal cell carcinoma   . 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  Flow cytometry 04/04/12: 91% cells CD4 positive CD 26 negative  . Hyperlipemia   . Pneumonia   . Wound of right leg 09/25/2012    Necrotic,  open wound right lower leg; non healing  . Stroke   . Hyposmolality and/or hyponatremia   . Lower limb amputation, above knee 10/11/2012  . Pain in joint, multiple sites   . Mycosis fungoides, unspecified site, extranodal and solid organ sites 09/28/2012  . Occlusion and stenosis of carotid artery without mention of cerebral infarction 09/28/2012  . Chronic venous hypertension with ulcer and inflammation 09/28/2012  . Diverticulosis of colon (without mention of hemorrhage) 09/28/2012  . Synovial cyst of popliteal space 03/26/2012  . Unspecified constipation 03/24/2012  .  Hypopotassemia 01/26/2012  . Debility, unspecified 01/17/2012  . Anxiety state, unspecified 12/31/2011  . Anemia, unspecified 12/21/2011  . Other specified disease of white blood cells 12/21/2011  . Peripheral vascular disease, unspecified 12/21/2011    recent ABI Lt of 0.64 down from ).80 on 12/29/11- though different labs  . Reflux esophagitis 12/21/2011  . Unspecified hereditary and idiopathic peripheral neuropathy 12/14/2011  . Pain in joint, ankle and foot 12/14/2011  . Carotid stenosis     CAROTID DOPPLER, 05/02/2012 - LEFT VERTEBRAL-occluded, LEFT BULB AND PROXIMAL ICA STENT-moderate amount of irregular mixed dense plaque 50-69% diameter reduction  . Pre-syncope     NUCLEAR STRESS TEST, 12/10/2009 - normal, EKG negative for ischemia  . TIA (transient ischemic attack)     2D ECHO, 12/05/2012 - EF 60-65%, Severely calcified annulus of the mitral valve with mild-moderate regurgitation, LA moderate-severely dilated  . Paroxysmal atrial fibrillation   . CAD in native artery     3 vessel CAD, medical therapy, not a candidate for CABG  . S/P AKA (above knee amputation) unilateral, Rt leg due to PAD and non healing wound 06/27/2013  . HCAP (healthcare-associated pneumonia) 01/08/2012  . 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 albuterol  2.5 mg Nebulization Q6H  . antiseptic oral rinse  7 mL Mouth Rinse BID  . carvedilol  12.5 mg Oral BID WC  . diltiazem  120 mg Oral Q breakfast  . enoxaparin (LOVENOX) injection  1 mg/kg Subcutaneous Q24H  . famotidine  20 mg Oral Daily  . feeding supplement (ENSURE)  1 Container Oral BID BM  . folic acid  1 mg Oral Q breakfast  . isosorbide mononitrate  30 mg Oral Q breakfast  . omega-3 acid ethyl esters  1 g Oral Daily  . polyethylene glycol  17 g Oral QODAY  . polyvinyl alcohol  1 drop Both Eyes BID  . senna-docusate  1 tablet Oral BID  . sodium chloride  3 mL Intravenous Q12H  . triamcinolone cream   Topical UD   Infusions:    Assessment: 80 yo female from SNF presents to ER with fever, hemoptysis, and hypotension. Patient with hx chronic diastolic CHF, s/p right AKA, peripheral T cell lymphoma, PAF, HTN, GERD. To start cefepime and vancomycin per pharmacy for rule out sepsis. Note unknown allergy to PCN but patient has tolerated cefepime in past per records  12/4 >> vanc >> 12/7 12/4 >> cefepime >> 12/8 12/8 >> Primaxin >>  Tmax: 100.5 AM 12/7, afebrile 12/8 WBCs: 18.5, improved Renal: Scr 1.36, up, est CrCl 23 ml/min  12/4 blood x2: klebsiella - resistant only to ampicillin 12/4 urine: NGF 12/4 sputum: not collected 12/4 flu: negative 12/4 strep urinary antigen: negative  Previous: 08/03/14 urine: klebsiella - resistant only to amp and intermediate to nitro  Today, 12/8: Changing abx's to Primaxin per pharmacy for coverage of aspiration PNA and klebsiella bacteremia  Goal of Therapy:  Treatment of infections, adjustment per renal function  Plan:  Primaxin 250mg  IV q8 for CrCl N 21-40 ml/min and wt of 66kg   Adrian Saran, PharmD, BCPS Pager [phone] 08/20/2014 2:30 PM

## 2014-08-20 NOTE — Progress Notes (Signed)
Clinical Social Work Department BRIEF PSYCHOSOCIAL ASSESSMENT 08/20/2014  Patient:  Sara [name],Sara [name]     Account Number:  [id number]     Admit date:  08/16/2014  Clinical Social Worker:  Maryln Manuel  Date/Time:  08/20/2014 02:50 PM  Referred by:  Physician  Date Referred:  08/20/2014 Referred for  SNF Placement   Other Referral:   Interview type:  Patient Other interview type:   and patient daughter at bedside    PSYCHOSOCIAL DATA Living Status:  FACILITY Admitted from facility:  [name] Level of care:  [name] Primary support name:  Sara [name] Lane/daughter/[phone] Primary support relationship to patient:  CHILD, ADULT Degree of support available:   strong    CURRENT CONCERNS Current Concerns  Post-Acute Placement   Other Concerns:    SOCIAL WORK ASSESSMENT / PLAN CSW received referral that pt admitted from [name].    CSW met with pt and pt daughter at bedside. CSW introduced self and explained role. Pt daughter feeding pt her lunch at this time. Pt daughter confirmed that pt has been [name] resident at [name] for eleven years and plan will be for pt to return. CSW discussed that CSW to keep [name] updated and provide facility clinicals throughout admission. Pt daughter expressed understanding.    CSW completed FL2 and sent clinicals to [name].    CSW to continue to follow.   Assessment/plan status:  Psychosocial Support/Ongoing Assessment of Needs Other assessment/ plan:   discharge planning   Information/referral to community resources:   Referral back to [name]    PATIENT'S/FAMILY'S RESPONSE TO PLAN OF CARE: Pt alert and per chart, pt has had acute encephalopathy. Pt daughter supportive and actively involved in pt care. Pt has been [name] long time resdent at [name] and CSW updated facility regarding pt admission to  [name].   Sara [name], MSW, [name] [phone]

## 2014-08-20 NOTE — Plan of Care (Signed)
Problem: Phase I Progression Outcomes Goal: Pain controlled with appropriate interventions Outcome: Not Applicable Date Met:  22/02/66 Goal: OOB as tolerated unless otherwise ordered Outcome: Progressing Goal: Consider pulmonary consult Outcome: Completed/Met Date Met:  08/20/14

## 2014-08-20 NOTE — Progress Notes (Signed)
[name]    Date of Admission:  08/16/2014   Total days of antibiotics 5        Day 5 cefepime           ID: Sara [name] is a 80 y.o. 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 Had increase work of breathing on 4LNC, now somewhat improved, but requiring bipap  Medications:  . albuterol  2.5 mg Nebulization Q6H  . antiseptic oral rinse  7 mL Mouth Rinse BID  . carvedilol  12.5 mg Oral BID WC  . diltiazem  120 mg Oral Q breakfast  . enoxaparin (LOVENOX) injection  1 mg/kg Subcutaneous Q24H  . famotidine  20 mg Oral Daily  . feeding supplement (ENSURE)  1 Container Oral BID BM  . folic acid  1 mg Oral Q breakfast  . isosorbide mononitrate  30 mg Oral Q breakfast  . omega-3 acid ethyl esters  1 g Oral Daily  . polyethylene glycol  17 g Oral QODAY  . polyvinyl alcohol  1 drop Both Eyes BID  . senna-docusate  1 tablet Oral BID  . sodium chloride  3 mL Intravenous Q12H  . triamcinolone cream   Topical UD    Objective: Vital signs in last 24 hours: Temp:  [98.4 F (36.9 C)-100.5 F (38.1 C)] 98.4 F (36.9 C) (12/08 0800) Pulse Rate:  [62-156] 105 (12/08 1200) Resp:  [22-34] 27 (12/08 1200) BP:  (112-195)/(52-106) 112/52 mmHg (12/08 1200) SpO2:  [95 %-99 %] 97 % (12/08 1200) Weight:  [145 lb 11.6 oz (66.1 kg)] 145 lb 11.6 oz (66.1 kg) (12/08 0400) Physical Exam  Constitutional:  Sitting up in chair with bipap. Unable to answer questions. Frail elderly female. No distress.  HENT:  Mouth/Throat: bipap in place Cardiovascular: Normal rate, regular rhythm and normal heart sounds. Exam reveals no gallop and no friction rub.  No murmur heard.  Pulmonary/Chest: rhonchi,  Skin: Skin is dry, flaky, erythematous at baseline from CTCL and psoriasis  Lab Results  Recent Labs  08/19/14 0345 08/20/14 0331  WBC 24.1* 18.5*  HGB 10.6* 9.5*  HCT 33.6* 30.0*  NA 147 145  K 4.5 3.9  CL 109 107  CO2 23 25  BUN 47* 49*  CREATININE 1.23* 1.36*    Microbiology: 12/4 blood cx kleb pneumonia x 2 sets 12/5 urine cx ngtd Studies/Results: Dg Chest Port 1 View  08/19/2014   CLINICAL DATA:  Shortness of breath.  Dyspnea.  EXAM: PORTABLE CHEST - 1 VIEW  COMPARISON:  One-view chest 08/17/2014.  FINDINGS: The heart is mildly enlarged. The hemidiaphragms are more obscured bilaterally. Moderate pulmonary vascular congestion is present on the right. Right upper lobe airspace disease is  evident with some elevation of the minor fissure. Atherosclerotic changes are noted in the aortic arch.  IMPRESSION: 1. Persistent right upper lobe airspace disease. The persistent disease raise concern for a central obstructing lesion. If this persists, CT of the chest with contrast should be considered to evaluate for an obstructing lesion. 2. Increasing obscuration of the hemidiaphragms bilaterally. This suggests small bilateral pleural effusions and bibasilar airspace disease, likely atelectasis.   Electronically Signed   By: Lawrence Santiago M.D.   On: 08/19/2014 09:13     Assessment/Plan: Will change antibiotics to imipenem to cover aspiration pneumonia but will also cover klebsiella bacteremia ( ideally would do  amp/sub but patient has unknown pcn allergy)  Agree with palliative consultation to see if family needs to coordinate with hospice in case the patient is having worsening respiratory distress and high chance of requiring intubation. Her daughter reports that they would not want her intubated  Baxter Flattery [facility] Cell: [phone] Pager: [phone]  08/20/2014, 2:24 PM

## 2014-08-21 ENCOUNTER — Inpatient Hospital Stay (HOSPITAL_COMMUNITY): Payer: Medicare Other

## 2014-08-21 DIAGNOSIS — R7881 Bacteremia: Secondary | ICD-10-CM | POA: Insufficient documentation

## 2014-08-21 DIAGNOSIS — C8440 Peripheral T-cell lymphoma, not classified, unspecified site: Secondary | ICD-10-CM

## 2014-08-21 DIAGNOSIS — N390 Urinary tract infection, site not specified: Secondary | ICD-10-CM

## 2014-08-21 DIAGNOSIS — J9600 Acute respiratory failure, unspecified whether with hypoxia or hypercapnia: Secondary | ICD-10-CM

## 2014-08-21 DIAGNOSIS — R0600 Dyspnea, unspecified: Secondary | ICD-10-CM | POA: Insufficient documentation

## 2014-08-21 DIAGNOSIS — J150 Pneumonia due to Klebsiella pneumoniae: Secondary | ICD-10-CM

## 2014-08-21 DIAGNOSIS — J189 Pneumonia, unspecified organism: Secondary | ICD-10-CM | POA: Insufficient documentation

## 2014-08-21 DIAGNOSIS — B961 Klebsiella pneumoniae [K. pneumoniae] as the cause of diseases classified elsewhere: Secondary | ICD-10-CM | POA: Insufficient documentation

## 2014-08-21 DIAGNOSIS — R6521 Severe sepsis with septic shock: Secondary | ICD-10-CM

## 2014-08-21 DIAGNOSIS — R918 Other nonspecific abnormal finding of lung field: Secondary | ICD-10-CM

## 2014-08-21 LAB — BASIC METABOLIC PANEL
Anion gap: 14 (ref 5–15)
Anion gap: 13 (ref 5–15)
BUN: 55 mg/dL — ABNORMAL HIGH (ref 6–23)
BUN: 55 mg/dL — AB (ref 6–23)
CO2: 25 mEq/L (ref 19–32)
CO2: 24 mEq/L (ref 19–32)
Calcium: 9.6 mg/dL (ref 8.4–10.5)
Calcium: 9.7 mg/dL (ref 8.4–10.5)
Chloride: 112 mEq/L (ref 96–112)
Chloride: 111 mEq/L (ref 96–112)
Creatinine, Ser: 1.3 mg/dL — ABNORMAL HIGH (ref 0.50–1.10)
Creatinine, Ser: 1.17 mg/dL — ABNORMAL HIGH (ref 0.50–1.10)
GFR, EST AFRICAN AMERICAN: 40 mL/min — AB (ref >90)
GFR, EST AFRICAN AMERICAN: 45 mL/min — AB (ref >90)
GFR, EST NON AFRICAN AMERICAN: 34 mL/min — AB (ref >90)
GFR, EST NON AFRICAN AMERICAN: 39 mL/min — AB (ref >90)
Glucose, Bld: 142 mg/dL — ABNORMAL HIGH (ref 70–99)
Glucose, Bld: 219 mg/dL — ABNORMAL HIGH (ref 70–99)
POTASSIUM: 4.1 meq/L (ref 3.7–5.3)
Potassium: 4.2 mEq/L (ref 3.7–5.3)
SODIUM: 151 meq/L — AB (ref 137–147)
Sodium: 148 mEq/L — ABNORMAL HIGH (ref 137–147)

## 2014-08-21 LAB — CBC
HCT: 29.3 % — ABNORMAL LOW (ref 36.0–46.0)
Hemoglobin: 9.1 g/dL — ABNORMAL LOW (ref 12.0–15.0)
MCH: 30 pg (ref 26.0–34.0)
MCHC: 31.1 g/dL (ref 30.0–36.0)
MCV: 96.7 fL (ref 78.0–100.0)
Platelets: 198 10*3/uL (ref 150–400)
RBC: 3.03 MIL/uL — ABNORMAL LOW (ref 3.87–5.11)
RDW: 14.1 % (ref 11.5–15.5)
WBC: 21.7 10*3/uL — ABNORMAL HIGH (ref 4.0–10.5)

## 2014-08-21 LAB — GLUCOSE, CAPILLARY: GLUCOSE-CAPILLARY: 131 mg/dL — AB (ref 70–99)

## 2014-08-21 MED ORDER — HYDRALAZINE HCL 20 MG/ML IJ SOLN
5.0000 mg | Freq: Once | INTRAMUSCULAR | Status: AC
  Administered 2014-08-21: 5 mg via INTRAVENOUS
  Filled 2014-08-21: qty 1

## 2014-08-21 NOTE — Progress Notes (Signed)
Patient ID: Sara [name], female   DOB: 1919/02/14, 80 y.o.   MRN: [id number]  [facility] PROGRESS NOTE  Sara [name]:[id number] DOB: 1918-10-22 DOA: 08/16/2014 PCP: Sara Dooms, MD  Brief narrative:  Brief narrative: 80 year old female with past medical history of PAD status post stenting (LICA 90/2409), right AKA, paroxysmal atrial fibrillation on AC with pradaxa,dementia, hypertension, chronic diastolic CHF (2 D ECHO in 08/2014 with EF of 55%, wheelchair bound who presented from [facility] ED with reports of not feeling very well and reports of fevers. She is not a very good historian due to dementia. Apparently no respiratory distress but on admission patient was found to have O2 saturation of 86%. No other complaints.  In ED, BP was 71/42 to 141/49, HR 59-91, RR 25 and T max 102 F. Oxygen saturation was 86% on room air, improved to 92% with [facility] oxygen support. CXR consistent with right upper lung lobe pneumonia. She was started on broad spectrum abx, cefepime and vanco for sepsis and pneumonia. She is DNR and stable with current BP of 141/49 to go to telemetry floor.   Assessment and Plan:   Principal Problem: Severe sepsis / Leukocytosis / HCAP  - sepsis criteria met with initial vitals hypotension, tachypnea, bradycardia, T max 102 F, leukocytosis, evidence of end organ damange which includes hypoxia and ARF, evidence of PNA on CXR. - blood cultures positive for Klebsiella (source possible from recent UTI, urine culture from 11/26 positive for Klebsiella)  - started broad spectrum antibiotics Vancomycin and Maxipime. Vanc d/c per ID 12/07, maxipime d/c 12/08 - ABX changed to Primaxin to cover for aspiration PNA 12/08 - pt afebrile over the past 24 hours, still with mild dyspnea and accessory muscles use - nebulizer treatments as needed for shortness of breath  - keep in SDU  - d/w family consideration of palliative care input, family to get back in  touch with me with the decision   Active Problems: Chronic diastolic HF (heart failure) - last BNP in 12/2013 with BNP in 300-400 range. 2 D ECHO in 08/2013 showed EF 55%. - weight from admission: 136 lbs --> 151 lbs --> 143 lbs --> 144 this AM - stopped IVF 12/06 - pt started on Lasix 20 mg IV BID 12/6, holding Lasix since 12/08 - continue to monitor daily weights, strict I's and O's Hypernatremia - possibly pre renal - holding IVF to avoid volume overload  HTN (hypertension) - all antihypertensives on hold on admission due to hypotension  - BP stabilizing and SBP in 140's this AM - resumed Coreg 12. 5 mg PO BID and Cardizem 12/06 - resumed Imdur 12/07 - continue to hold Lisinopril and Lasix  Leukocytosis - pt with known Lymphoma so wondering if this is contributing  - will d/w Dr. Alvy Bimler  PAF (paroxysmal atrial fibrillation) / Chronic anticoagulation- Pradaxa - on pradaxa but on hold due to worsening renal failure, low GFR S/P R AKA due to PAD and non healing wound / PAD - LICA stent 7/35 - stable - continue gabapentin  Stroke, acute, Rt brain, embolic March 3299 - pt is on pradaxa but still on hold due to worsening renal failure. 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 - hold targretin in the setting of an acute infection, sepsis ARF (acute  renal failure)  - continue to hold lisinopril and lasix  - repeat BMP in AM Anemia of chronic disease - secondary to peripheral T cell lymphoma and chemotherapy  - hemoglobin is 11 on admission and slightly down since admission - no signs of active bleeding  - no current indications for transfusion  GERD (gastroesophageal reflux disease) - continue Pepcid 40 mg daily  Hypokalemia - supplemented and WNL this AM Severe PCM - in the context of acute  illness - advance diet as pt able to tolerate  - aspiration precautions  Functional quadriplegia - bed bound since admission - will attempt to get OOB to chair   DVT prophylaxis:  - enoxaparin subQ since admission   Code Status: Partial, no intubation  Family Communication: family at bedside this AM Disposition Plan: Remains inpatient   IV Access:    Peripheral IV Procedures and diagnostic studies:    CXR  08/21/2014   Stable dense right upper lobe pneumonia associated with progressive volume loss over the past 5 days. A possible right hilar or perihilar mass is now all better visible due to the progressive right upper lobe volume loss. CT of the chest with contrast and possibly bronchoscopy may be helpful in further evaluation. Stable small bilateral pleural effusions. Stable passive atelectasis versus pneumonia in the left lower lobe. Increasing patchy pneumonia in the right lower lobe  CXR 08/19/2014 Persistent RUL airspace disease, ? central obstructing lesion. small pleural effusions, atelectasis.   CXR12/01/2014 Persistent dense consolidative right upper lobe pneumonia. Background COPD/ emphysema.   CXR12/12/2013 Increasing right upper lobe pneumonia.   CXR 08/16/2014 Right upper lobe pneumonia. Post treatment radiographs recommended to document resolution.  Medical Consultants:    PCCM  ID Other Consultants:    Physical therapy  Anti-Infectives:    Vancomycin 12/04 --> 12/07  Maxipime 12/04 --> 12/08  Primaxin 12/08 -->  [name], MD  [facility] Pager [phone]  If 7PM-7AM, please contact night-coverage [email or web address] Password [email or web address] 08/21/2014, 11:38 AM   LOS: 5 days   HPI/Subjective: No events overnight.   Objective: Filed Vitals:   08/21/14 0600 08/21/14 0731 08/21/14 0823 08/21/14 0907  BP: 178/65 178/93  141/80  Pulse: 81 86  98  Temp:   97.8 F (36.6 C)   TempSrc:   Oral   Resp: $Remo'26 24  26  'xCwoK$ Height:       Weight:      SpO2: 98% 99%  97%    Intake/Output Summary (Last 24 hours) at 08/21/14 1138 Last data filed at 08/21/14 0600  Gross per 24 hour  Intake    420 ml  Output   1325 ml  Net   -905 ml    Exam:   General:  Pt is alert, follows commands appropriately, confused   Cardiovascular: Irregular rate and rhythm, no rubs, no gallops  Respiratory: tachypnea with accessory muscles use, rales at bases  Abdomen: Soft, non tender, non distended, bowel sounds present, no guarding  Extremities: R AKA  Data Reviewed: Basic Metabolic Panel:  Recent Labs Lab 08/18/14 0400 08/18/14 1314 08/19/14 0345 08/20/14 0331 08/21/14 0338  NA 141 144 147 145 151*  K 4.6 4.8 4.5 3.9 4.1  CL 104 107 109 107 112  CO2 $Re'22 21 23 25 25  'dYH$ GLUCOSE 107* 166* 118* 173* 142*  BUN 49* 50* 47* 49* 55*  CREATININE 1.59* 1.40* 1.23* 1.36* 1.30*  CALCIUM 8.8 9.2 9.4 9.6 9.6   Liver Function Tests:  Recent Labs Lab 08/16/14 1020 08/17/14  0350  AST 20 20  ALT 11 10  ALKPHOS 50 35*  BILITOT 0.8 0.6  PROT 6.9 6.0  ALBUMIN 3.0* 2.5*   CBC:  Recent Labs Lab 08/16/14 1124 08/17/14 0350 08/18/14 0400 08/19/14 0345 08/20/14 0331 08/21/14 0338  WBC 16.0* 15.7* 22.8* 24.1* 18.5* 21.7*  NEUTROABS 10.5*  --   --   --   --   --   HGB 11.0* 11.0* 10.2* 10.6* 9.5* 9.1*  HCT 32.8* 34.5* 31.6* 33.6* 30.0* 29.3*  MCV 92.1 94.0 94.3 94.6 94.6 96.7  PLT 201 195 197 181 188 198   CBG:  Recent Labs Lab 08/17/14 0751 08/20/14 0800 08/21/14 0802  GLUCAP 98 137* 131*    Recent Results (from the past 240 hour(s))  Blood culture (routine x 2)     Status: None   Collection Time: 08/16/14 10:20 AM  Result Value Ref Range Status   Specimen Description BLOOD RIGHT ANTECUBITAL  Final   Special Requests BOTTLES DRAWN AEROBIC AND ANAEROBIC 5ML  Final   Culture  Setup Time   Final    08/16/2014 12:39 Performed at [facility]    Culture   Final    KLEBSIELLA PNEUMONIAE Note: Gram Stain  Report Called to,Read Back By and Verified With: TEVET WOODS RN [id number] 08/16/14 BY EDMOJ Performed at [facility]    Report Status 08/18/2014 FINAL  Final   Organism ID, Bacteria KLEBSIELLA PNEUMONIAE  Final      Susceptibility   Klebsiella pneumoniae - MIC*    AMPICILLIN >=32 RESISTANT Resistant     AMPICILLIN/SULBACTAM 4 SENSITIVE Sensitive     CEFAZOLIN <=4 SENSITIVE Sensitive     CEFEPIME <=1 SENSITIVE Sensitive     CEFTAZIDIME <=1 SENSITIVE Sensitive     CEFTRIAXONE <=1 SENSITIVE Sensitive     CIPROFLOXACIN <=0.25 SENSITIVE Sensitive     GENTAMICIN <=1 SENSITIVE Sensitive     IMIPENEM <=0.25 SENSITIVE Sensitive     PIP/TAZO <=4 SENSITIVE Sensitive     TOBRAMYCIN <=1 SENSITIVE Sensitive     TRIMETH/SULFA <=20 SENSITIVE Sensitive     * KLEBSIELLA PNEUMONIAE  Blood culture (routine x 2)     Status: None   Collection Time: 08/16/14 10:25 AM  Result Value Ref Range Status   Specimen Description BLOOD LEFT ANTECUBITAL  Final   Special Requests BOTTLES DRAWN AEROBIC AND ANAEROBIC 3ML  Final   Culture  Setup Time   Final    08/16/2014 12:39 Performed at [facility]    Culture   Final    KLEBSIELLA PNEUMONIAE Note: SUSCEPTIBILITIES PERFORMED ON PREVIOUS CULTURE WITHIN THE LAST 5 DAYS. Note: Gram Stain Report Called to,Read Back By and Verified With: TEVET WOODS RN [id number] Performed at [facility]    Report Status 08/18/2014 FINAL  Final  MRSA PCR Screening     Status: None   Collection Time: 08/16/14  2:10 PM  Result Value Ref Range Status   MRSA by PCR NEGATIVE NEGATIVE Final    Comment:        The GeneXpert MRSA Assay (FDA approved for NASAL specimens only), is one component of a comprehensive MRSA colonization surveillance program. It is not intended to diagnose MRSA infection nor to guide or monitor treatment for MRSA infections.   Culture, Urine     Status: None   Collection Time: 08/17/14 12:28 PM  Result Value Ref Range Status   Specimen  Description URINE, RANDOM  Final   Special Requests NONE  Final  Culture  Setup Time   Final    08/17/2014 20:01 Performed at [place] Performed at [facility]   Final   Culture NO GROWTH Performed at [facility]   Final   Report Status 08/18/2014 FINAL  Final     Scheduled Meds: . albuterol  2.5 mg Nebulization Q6H  . antiseptic oral rinse  7 mL Mouth Rinse BID  . carvedilol  12.5 mg Oral BID WC  . diltiazem  120 mg Oral Q breakfast  . enoxaparin (LOVENOX) injection  1 mg/kg Subcutaneous Q24H  . famotidine  20 mg Oral Daily  . feeding supplement (ENSURE)  1 Container Oral BID BM  . folic acid  1 mg Oral Q breakfast  . imipenem-cilastatin  250 mg Intravenous Q8H  . isosorbide mononitrate  30 mg Oral Q breakfast  . omega-3 acid ethyl esters  1 g Oral Daily  . polyethylene glycol  17 g Oral QODAY  . polyvinyl alcohol  1 drop Both Eyes BID  . senna-docusate  1 tablet Oral BID  . sodium chloride  3 mL Intravenous Q12H  . triamcinolone cream   Topical UD   Continuous Infusions:

## 2014-08-21 NOTE — Progress Notes (Signed)
CARE MANAGEMENT NOTE 08/21/2014  Patient:  Sara [name]   Account Number:  [id number]  Date Initiated:  08/21/2014  Documentation initiated by:  Sara [name]  Subjective/Objective Assessment:   resp.failure/bipap     Action/Plan:   snf placement   Anticipated DC Date:  08/24/2014   Anticipated DC Plan:  SKILLED NURSING FACILITY  In-house referral  Clinical Social Worker  Hospice / [facility]  CM consult      [facility] Choice  NA   Choice offered to / List presented to:  NA      DME agency  NA     [place] arranged  NA      [place] agency  NA   Status of service:  In process, will continue to follow Medicare Important Message given?   (If response is "NO", the following Medicare IM given date fields will be blank) Date Medicare IM given:   Medicare IM given by:   Date Additional Medicare IM given:   Additional Medicare IM given by:    Discharge Disposition:    Per UR Regulation:  Reviewed for med. necessity/level of care/duration of stay  If discussed at [facility] of Stay Meetings, dates discussed:    Comments:  [id number]/Sara Parker,RN,BSN,CCM

## 2014-08-21 NOTE — Progress Notes (Signed)
Sara [name]   DOB:04-29-1919   UE#:[id number]    This patient had background history of peripheral T-cell lymphoma Subjective: Patient is seen as per request by family members. She was admitted for sepsis, source from urinary tract. She has significant respiratory failure requiring BiPAP. I am not able to interact with the patient that she is on BiPAP. Family noted progressive decline in functional status   Objective:  Filed Vitals:   08/21/14 1936  BP:   Pulse:   Temp: 97.3 F (36.3 C)  Resp:      Intake/Output Summary (Last 24 hours) at 08/21/14 2045 Last data filed at 08/21/14 1800  Gross per 24 hour  Intake   1290 ml  Output    685 ml  Net    605 ml    GENERAL:alert, with BiPAP in situ SKIN: Has significant skin erythema consistent with a peripheral T-cell lymphoma, improved compared to prior exam EYES: normal, Conjunctiva are pink injected, sclera clear LUNGS: clear to auscultation and percussion with increased breathing effort, assisted by BiPAP machine HEART: Irregular heart rate is noted. NEURO: alert but unable to assess status   Labs:  Lab Results  Component Value Date   WBC 21.7* 08/21/2014   HGB 9.1* 08/21/2014   HCT 29.3* 08/21/2014   MCV 96.7 08/21/2014   PLT 198 08/21/2014   NEUTROABS 10.5* 08/16/2014    Lab Results  Component Value Date   NA 148* 08/21/2014   K 4.2 08/21/2014   CL 111 08/21/2014   CO2 24 08/21/2014    Studies:  Dg Chest Port 1 View  08/21/2014   CLINICAL DATA:  Follow-up right upper lobe pneumonia.  EXAM: PORTABLE CHEST - 1 VIEW  COMPARISON:  Portable chest x-rays 08/19/2014 dating back to 08/16/2014, 10/05/2012. Two-view chest x-ray 08/02/2014, 07/22/2012. CTA chest 10/18/2012.  FINDINGS: Cardiac silhouette mildly to moderately enlarged. Pulmonary vascularity normal without evidence of pulmonary edema. Dense consolidation in the right upper lobe with progressive volume loss since the examination 5 days ago, new since 08/02/2014.  Because of the volume loss, the right hilar and perihilar region is now better visible and there is a masslike opacity in this region. Bilateral pleural effusions and consolidation in the lower lobes, left greater than right, unchanged on the left and increasing on the right since the examination 2 days ago.  IMPRESSION: 1. Stable dense right upper lobe pneumonia associated with progressive volume loss over the past 5 days. A possible right hilar or perihilar mass is now all better visible due to the progressive right upper lobe volume loss. CT of the chest with contrast and possibly bronchoscopy may be helpful in further evaluation. 2. Stable small bilateral pleural effusions. Stable passive atelectasis versus pneumonia in the left lower lobe. Increasing patchy pneumonia in the right lower lobe.   Electronically Signed   By: Evangeline Dakin M.D.   On: 08/21/2014 10:16    Assessment & Plan:   Peripheral T-cell lymphoma Agreed to hold off Tagretin Overall, the skin is improving  Respiratory failure secondary to sepsis She is currently on noninvasive assistance with BiPAP. The patient had advanced directives for DNR  Sepsis from urinary tract infection She is currently on broad-spectrum intravenous antibiotics.  Anemia in neoplastic disease No need to consider transfusion unless <8 g  Discharge planning The family member inquires about her prognosis. I do not believe the diagnosis of peripheral the lymphoma should impact her longevity.  She is currently on maximal supportive care  Will follow  [facility], [place], MD 08/21/2014  8:45 PM

## 2014-08-21 NOTE — Progress Notes (Signed)
[facility] for Infectious Disease    Date of Admission:  08/16/2014   Total days of antibiotics 6        Day 2 imipenem           ID: [id number] RICK is a 80 y.o. 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 Off of bipap this morning but now back on bipap resting in bed. Repeat cxr this morning shows more volume loss associated with rul infiltrate and lung mass  Medications:  . albuterol  2.5 mg Nebulization Q6H  . antiseptic oral rinse  7 mL Mouth Rinse BID  . carvedilol  12.5 mg Oral BID WC  . diltiazem  120 mg Oral Q breakfast  . enoxaparin (LOVENOX) injection  1 mg/kg Subcutaneous Q24H  . famotidine  20 mg Oral Daily  . feeding supplement (ENSURE)  1 Container Oral BID BM  . folic acid  1 mg Oral Q breakfast  . imipenem-cilastatin  250 mg Intravenous Q8H  . isosorbide mononitrate  30 mg Oral Q breakfast  . omega-3 acid ethyl esters  1 g Oral Daily  . polyethylene glycol  17 g Oral QODAY  . polyvinyl alcohol  1 drop Both Eyes BID  . senna-docusate  1 tablet Oral BID  . sodium chloride  3 mL Intravenous Q12H  . triamcinolone cream   Topical UD    Objective: Vital signs in last 24 hours: Temp:  [97.8 F (36.6 C)-99.1 F (37.3 C)] 97.8 F (36.6 C)  (12/09 0823) Pulse Rate:  [69-135] 102 (12/09 1000) Resp:  [19-32] 19 (12/09 1214) BP: (141-198)/(60-101) 145/74 mmHg (12/09 1214) SpO2:  [95 %-99 %] 95 % (12/09 1000) FiO2 (%):  [35 %-40 %] 35 % (12/09 1417) Weight:  [144 lb 10 oz (65.6 kg)] 144 lb 10 oz (65.6 kg) (12/09 0200) Physical Exam  Constitutional: Frail elderly female. No distress.  HENT:  Mouth/Throat: bipap in place   Lab Results  Recent Labs  08/20/14 0331 08/21/14 0338  WBC 18.5* 21.7*  HGB 9.5* 9.1*  HCT 30.0* 29.3*  NA 145 151*  K 3.9 4.1  CL 107 112  CO2 25 25  BUN 49* 55*  CREATININE 1.36* 1.30*    Microbiology: 12/4 blood cx kleb pneumonia x 2 sets 12/5 urine cx ngtd Studies/Results: Dg Chest Port 1 View  08/21/2014   CLINICAL DATA:  Follow-up right upper lobe pneumonia.  EXAM: PORTABLE CHEST - 1 VIEW  COMPARISON:  Portable chest x-rays 08/19/2014 dating back to 08/16/2014, 10/05/2012. Two-view chest x-ray 08/02/2014, 07/22/2012. CTA chest 10/18/2012.  FINDINGS: Cardiac silhouette mildly to moderately enlarged. Pulmonary vascularity normal without evidence of pulmonary edema. Dense consolidation in the right upper lobe with progressive volume loss since the examination 5 days ago,  new since 08/02/2014. Because of the volume loss, the right hilar and perihilar region is now better visible and there is a masslike opacity in this region. Bilateral pleural effusions and consolidation in the lower lobes, left greater than right, unchanged on the left and increasing on the right since the examination 2 days ago.  IMPRESSION: 1. Stable dense right upper lobe pneumonia associated with progressive volume loss over the past 5 days. A possible right hilar or perihilar mass is now all better visible due to the progressive right upper lobe volume loss. CT of the chest with contrast and possibly bronchoscopy may be helpful in further evaluation. 2. Stable small bilateral pleural effusions. Stable passive atelectasis versus  pneumonia in the left lower lobe. Increasing patchy pneumonia in the right lower lobe.   Electronically Signed   By: Evangeline Dakin M.D.   On: 08/21/2014 10:16     Assessment/Plan: kleb pneumonaie +/- aspiration vs. Post obstruction pna = Will change antibiotics to imipenem to cover aspiration pneumonia but will also cover klebsiella bacteremia ( ideally would do amp/sub but patient has unknown pcn allergy)  Lung mass = if continues to be stable, it appears she may need chest CT pending family decision with goals of care  Agree with palliative consultation to see if family needs to coordinate with hospice in case the patient is having worsening respiratory distress and high chance of requiring intubation. Her daughter reports that they would not want her intubated  [facility], [facility] for Infectious Diseases Cell: [phone] Pager: [phone]  08/21/2014, 2:25 PM

## 2014-08-21 NOTE — Progress Notes (Signed)
PULMONARY / CRITICAL CARE MEDICINE   Name: Sara [name] MRN: [id number] DOB: May 27, 1919    ADMISSION DATE:  08/16/2014 CONSULTATION DATE:  12/8  REFERRING MD :  Doyle Askew   CHIEF COMPLAINT:  Acute respiratory failure HCAP   INITIAL PRESENTATION:  80 year old female who resides at a SNF w/ sig medical h/o CAF, PAD, mild dementia (but cognitive fxn fairly intact), prior AKA on right, and chronic diastolic HF Admitted on 08/6 from the SNF w/ RUL PNA. She was admitted to the SDU, placed on empiric abx and NIPPV.Culture data was positive for Klebsiella PNA. ID in 2/2 blood cultures. Infectious disease was consulted. She had made very slow progress clinically and radiographically. PCCM asked to see on 12/8 as she still was requiring NIPPV   STUDIES:    SIGNIFICANT EVENTS:    SUBJECTIVE:  No acute c/o   VITAL SIGNS: Temp:  [98.2 F (36.8 C)-99.1 F (37.3 C)] 99.1 F (37.3 C) (12/09 0400) Pulse Rate:  [69-135] 86 (12/09 0731) Resp:  [19-32] 24 (12/09 0731) BP: (112-198)/(52-101) 178/93 mmHg (12/09 0731) SpO2:  [95 %-99 %] 99 % (12/09 0731) FiO2 (%):  [40 %] 40 % (12/09 0718) Weight:  [65.6 kg (144 lb 10 oz)] 65.6 kg (144 lb 10 oz) (12/09 0200)  4 liters  HEMODYNAMICS:   VENTILATOR SETTINGS: Vent Mode:  [-]  FiO2 (%):  [40 %] 40 % INTAKE / OUTPUT:  Intake/Output Summary (Last 24 hours) at 08/21/14 0853 Last data filed at 08/21/14 0600  Gross per 24 hour  Intake    540 ml  Output   1325 ml  Net   -785 ml    PHYSICAL EXAMINATION: General:  80 year old female, resting comfortably on 4 liters Neuro:  Awake, generalized weakness. Follows commands  HEENT: [place], no JVD  Cardiovascular:  rrr Lungs:  Basilar rales, mild accessory muscle use Abdomen:  Soft, non-tender  Musculoskeletal:  Intact  Skin:  Intact   LABS:  CBC  Recent Labs Lab 08/19/14 0345 08/20/14 0331 08/21/14 0338  WBC 24.1* 18.5* 21.7*  HGB 10.6* 9.5* 9.1*  HCT 33.6* 30.0* 29.3*  PLT 181 188 198    Coag's No results for input(s): APTT, INR in the last 168 hours. BMET  Recent Labs Lab 08/19/14 0345 08/20/14 0331 08/21/14 0338  NA 147 145 151*  K 4.5 3.9 4.1  CL 109 107 112  CO2 23 25 25   BUN 47* 49* 55*  CREATININE 1.23* 1.36* 1.30*  GLUCOSE 118* 173* 142*   Electrolytes  Recent Labs Lab 08/19/14 0345 08/20/14 0331 08/21/14 0338  CALCIUM 9.4 9.6 9.6   Sepsis Markers  Recent Labs Lab 08/16/14 1030 08/16/14 2108  LATICACIDVEN 2.72* 1.8   ABG  Recent Labs Lab 08/16/14 1807  PHART 7.284*  PCO2ART 54.9*  PO2ART 72.1*   Liver Enzymes  Recent Labs Lab 08/16/14 1020 08/17/14 0350  AST 20 20  ALT 11 10  ALKPHOS 50 35*  BILITOT 0.8 0.6  ALBUMIN 3.0* 2.5*   Cardiac Enzymes  Recent Labs Lab 08/16/14 1020  PROBNP 19573.0*   Glucose  Recent Labs Lab 08/17/14 0751 08/20/14 0800 08/21/14 0802  GLUCAP 98 137* 131*    Imaging No results found.   ASSESSMENT / PLAN:  PULMONARY OETT A: acute respiratory failure in the setting of HCAP vs aspiration Clinically improving   P:   Cont NIPPV PRN mobilize Scheduled BDs Avoid sedating meds  F/u PCXR    CARDIOVASCULAR CVL A:  Sepsis  in the setting of bacteremia  Chronic diastolic HF HTN PAF  P:  Cont tele monitoring  Keep euvolemic at this point   RENAL A:  AKI: scr had improved after holding lasix  P:   Avoid hypotension in setting of active diuresis  Renal dose meds F/u chemistry Hold further lasix for now  GASTROINTESTINAL A:   Possible dysphagia P:   Aspiration precautions  PPI   HEMATOLOGIC A:   H/o T cell lymphoma of multiple LNs w/ multiple sites  Anemia of chronic illness  P:  Trend cbc [place] LMWH  INFECTIOUS A:   Sepsis w/ klebsiella bacteremia, likely from UTI 11/21.  Aspiration vs HCAP  P:   BCx2  12/6: Klebsiella pneumoniae- R to ampi UC 12/5: neg, 11/21 klebsiella  maxipime 12/4>>> Defer to ID   ENDOCRINE A:   No acute  P:   Trend glucose    NEUROLOGIC A:  Acute encephalopathy  P:   RASS goal: -0 D/c'd sedating meds Supportive care    FAMILY  - Update: family updated at length at bedside   - Inter-disciplinary family meet or Palliative Care meeting due by: 12/15  Summary - Able to tolerate off bipap this am, appears comfortable on [place], Na climbing & leucocytosis persists. Can proceed with CT chest in 24h if remains off bipap  Care during the described time interval was provided by me and/or other providers on the critical care team.  I have reviewed this patient's available data, including medical history, events of note, physical examination and test results as part of my evaluation  Rigoberto Noel. MD

## 2014-08-21 NOTE — Plan of Care (Signed)
Problem: Phase II Progression Outcomes Goal: Encourage coughing & deep breathing Outcome: Completed/Met Date Met:  08/21/14 Goal: Wean O2 if indicated Outcome: Progressing Goal: Pain controlled Outcome: Not Applicable Date Met:  08/21/14     

## 2014-08-21 NOTE — Consult Note (Signed)
WOC wound consult note Reason for Consult: Full thickness tissue loss on LLE, pretibial area.  Also dried eschar on left LE lateral aspect. Patient recently had RLE amputated for non-healing, chronic and infected wounds. Family member in room. Wound type:PAD Pressure Ulcer POA: No Measurement: Pretibial wound presents as a partially avulsed area (skin tear) measuring 3cm x 2cm x 0.2cm. The stable eschar on the LLE (lateral aspect) measures 3.5cm x 2.5cm. Wound bed:As described above. Drainage (amount, consistency, odor) Scant serous from pretibial wound. None from dried eschar. Periwound:intact. Dressing procedure/placement/frequency:I will implement a Prevalon Boot to pad and protect the Left heel and paint the stable eschar with betadine solution twice daily and cover with a dry dressing when it has air-dried.  I will implement a soft silicone foam dressing to the pretibial area with three times weekly changes. [place] nursing team will not follow, but will remain available to this patient, the nursing and medical teams.  Please re-consult if needed. Thanks, Maudie Flakes, MSN, RN, [place], [place], [place] [phone])

## 2014-08-21 NOTE — Plan of Care (Signed)
Problem: Phase I Progression Outcomes Goal: Dyspnea controlled at rest Outcome: Not Progressing Patient continues to remain on Bipap throughout most of the day and becomes dyspneic when placed on nasal cannula.  Will continue bipap until patient is less dyspneic with increased activity demands.

## 2014-08-22 ENCOUNTER — Inpatient Hospital Stay (HOSPITAL_COMMUNITY): Payer: Medicare Other

## 2014-08-22 DIAGNOSIS — J189 Pneumonia, unspecified organism: Secondary | ICD-10-CM | POA: Insufficient documentation

## 2014-08-22 DIAGNOSIS — J9600 Acute respiratory failure, unspecified whether with hypoxia or hypercapnia: Secondary | ICD-10-CM | POA: Insufficient documentation

## 2014-08-22 DIAGNOSIS — D72828 Other elevated white blood cell count: Secondary | ICD-10-CM

## 2014-08-22 LAB — BASIC METABOLIC PANEL
Anion gap: 12 (ref 5–15)
BUN: 56 mg/dL — ABNORMAL HIGH (ref 6–23)
CALCIUM: 9.5 mg/dL (ref 8.4–10.5)
CO2: 25 mEq/L (ref 19–32)
CREATININE: 1.2 mg/dL — AB (ref 0.50–1.10)
Chloride: 111 mEq/L (ref 96–112)
GFR calc Af Amer: 44 mL/min — ABNORMAL LOW (ref >90)
GFR, EST NON AFRICAN AMERICAN: 38 mL/min — AB (ref >90)
Glucose, Bld: 113 mg/dL — ABNORMAL HIGH (ref 70–99)
Potassium: 4.3 mEq/L (ref 3.7–5.3)
SODIUM: 148 meq/L — AB (ref 137–147)

## 2014-08-22 LAB — CBC
HCT: 29.9 % — ABNORMAL LOW (ref 36.0–46.0)
HEMOGLOBIN: 9.3 g/dL — AB (ref 12.0–15.0)
MCH: 30.1 pg (ref 26.0–34.0)
MCHC: 31.1 g/dL (ref 30.0–36.0)
MCV: 96.8 fL (ref 78.0–100.0)
Platelets: 266 10*3/uL (ref 150–400)
RBC: 3.09 MIL/uL — ABNORMAL LOW (ref 3.87–5.11)
RDW: 14.4 % (ref 11.5–15.5)
WBC: 23.2 10*3/uL — ABNORMAL HIGH (ref 4.0–10.5)

## 2014-08-22 LAB — GLUCOSE, CAPILLARY: GLUCOSE-CAPILLARY: 128 mg/dL — AB (ref 70–99)

## 2014-08-22 MED ORDER — ENOXAPARIN SODIUM 80 MG/0.8ML ~~LOC~~ SOLN
1.0000 mg/kg | SUBCUTANEOUS | Status: DC
  Administered 2014-08-23 – 2014-08-26 (×4): 65 mg via SUBCUTANEOUS
  Filled 2014-08-22 (×4): qty 0.8

## 2014-08-22 NOTE — Progress Notes (Signed)
SLP Cancellation Note  Patient Details Name: Sara [name] MRN: [id number] DOB: March 21, 1919   Cancelled treatment:       Reason Eval/Treat Not Completed: Medical issues which prohibited therapy (pt on bipap-unable to be taken off today per note from MD)   Claudie Fisherman, [facility] SLP [phone]

## 2014-08-22 NOTE — Progress Notes (Signed)
Speech Language Pathology Treatment: Dysphagia  Patient Details Name: MADGIE DHALIWAL MRN: [id number] DOB: 09-15-1918 Today's Date: 08/22/2014 Time: 1251-1309 SLP Time Calculation (min) (ACUTE ONLY): 18 min  Assessment / Plan / Recommendation Clinical Impression  Today pt has been off Bipap since this am per daughter Izora Gala and nurse Baker Janus.  Initially pt asleep during SLP visit - therefore SLP re-educated daughter Izora Gala to aspiration precautions to mitigate risk.  Izora Gala reports pt will occasionally cough when consuming ice and receiving dental care.  Izora Gala denies pt coughing with po intake.    SLP advised Izora Gala to assure pt is sitting fully upright with all po/oral care,etc due to likelihood of aspiration.  Izora Gala reports she has raised HOB to approximately 28* but states she will assure HOB elevation adequate from now on.  SLP demonstrated use of oral suction to Izora Gala to allow her to oral suction pt if needed.    Pt awoke and was agreeable to accept intake toward end of session.  SLP observed daughter feeding pt ice, broccolli, carrots and buttermilk.  Delayed oral transiting, manipulation and suspected delayed pharyngeal swallow but no overt symptoms of airway compromise and voice remained clear.  SLP can not rule out overt silent aspiration given h/o old CVAs but suspect primary risk is pt's respiratory status.    If MD desires to rule out overt silent aspiration, please order MBS otherwise recommend continue diet to maximize airway safety.     HPI HPI: 80 yo female adm to [facility] with severe sepsis - diagnosed with dense right upper lobe consolidation/pna.  Pt required Bipap for respiratory support.  She also has h/o GERD and old CVAs per previous imaging studies.    Pertinent Vitals Pain Assessment: No/denies pain  SLP Plan  Continue with current plan of care    Recommendations Diet recommendations: Dysphagia 3 (mechanical soft);Nectar-thick liquid (ice chips) Liquids provided via: Cup;No  straw Medication Administration: Whole meds with puree Supervision: Full supervision/cueing for compensatory strategies;Staff to assist with self feeding Compensations: Slow rate;Small sips/bites (assure pt swallows before giving more, rest break if pt short of breath or coughing) Postural Changes and/or Swallow Maneuvers: Seated upright 90 degrees;Upright 30-60 min after meal              Oral Care Recommendations: Oral care BID Follow up Recommendations:  (TBD) Plan: Continue with current plan of care    [place], [place] [facility] SLP [phone]

## 2014-08-22 NOTE — Progress Notes (Signed)
Sara [name]   DOB:[birth year]   ME#:[id number]    I have seen the patient, examined her and edited the notes as follows  Subjective: Patient seen and examined. Per nursing report, no new events overnight. She continues to be short of breath, and somewhat confused. Able to follow very simple commands, but unable to engage in conversation.  Scheduled Meds: . albuterol  2.5 mg Nebulization Q6H  . antiseptic oral rinse  7 mL Mouth Rinse BID  . carvedilol  12.5 mg Oral BID WC  . diltiazem  120 mg Oral Q breakfast  . enoxaparin (LOVENOX) injection  1 mg/kg Subcutaneous Q24H  . famotidine  20 mg Oral Daily  . feeding supplement (ENSURE)  1 Container Oral BID BM  . folic acid  1 mg Oral Q breakfast  . imipenem-cilastatin  250 mg Intravenous Q8H  . isosorbide mononitrate  30 mg Oral Q breakfast  . omega-3 acid ethyl esters  1 g Oral Daily  . polyethylene glycol  17 g Oral QODAY  . polyvinyl alcohol  1 drop Both Eyes BID  . senna-docusate  1 tablet Oral BID  . sodium chloride  3 mL Intravenous Q12H  . triamcinolone cream   Topical UD   Continuous Infusions:  PRN Meds:.acetaminophen, labetalol, ondansetron **OR** ondansetron (ZOFRAN) IV, RESOURCE THICKENUP CLEAR Objective:  Filed Vitals:   08/22/14 0400  BP: 179/71  Pulse: 79  Temp:   Resp: 24     Intake/Output Summary (Last 24 hours) at 08/22/14 2297 Last data filed at 08/22/14 0500  Gross per 24 hour  Intake   1220 ml  Output    410 ml  Net    810 ml    GENERAL:alert, ill appearing, Unable to engage in conversation due to respiratory symptoms SKIN: Has significant skin erythema consistent with a peripheral T-cell lymphoma, improved compared to prior exam EYES: normal, Conjunctiva are pink injected, sclera clear Mouth: without mucositis, or thrush LUNGS: Bibasilar crackles noted, without wheezing or rhonchi,  with increased breathing effort. HEART: Irregular heart rate is noted. NEURO: alert, follows very simple commands.     Labs:  Lab Results  Component Value Date   WBC 23.2* 08/22/2014   HGB 9.3* 08/22/2014   HCT 29.9* 08/22/2014   MCV 96.8 08/22/2014   PLT 266 08/22/2014   NEUTROABS 10.5* 08/16/2014    Lab Results  Component Value Date   NA 148* 08/22/2014   K 4.3 08/22/2014   CL 111 08/22/2014   CO2 25 08/22/2014    Studies:  Dg Chest Port 1 View  08/21/2014   CLINICAL DATA:  Follow-up right upper lobe pneumonia.  EXAM: PORTABLE CHEST - 1 VIEW  COMPARISON:  Portable chest x-rays 08/19/2014 dating back to 08/16/2014, 10/05/2012. Two-view chest x-ray 08/02/2014, 07/22/2012. CTA chest 10/18/2012.  FINDINGS: Cardiac silhouette mildly to moderately enlarged. Pulmonary vascularity normal without evidence of pulmonary edema. Dense consolidation in the right upper lobe with progressive volume loss since the examination 5 days ago, new since 08/02/2014. Because of the volume loss, the right hilar and perihilar region is now better visible and there is a masslike opacity in this region. Bilateral pleural effusions and consolidation in the lower lobes, left greater than right, unchanged on the left and increasing on the right since the examination 2 days ago.  IMPRESSION: 1. Stable dense right upper lobe pneumonia associated with progressive volume loss over the past 5 days. A possible right hilar or perihilar mass is now all better visible due to  the progressive right upper lobe volume loss. CT of the chest with contrast and possibly bronchoscopy may be helpful in further evaluation. 2. Stable small bilateral pleural effusions. Stable passive atelectasis versus pneumonia in the left lower lobe. Increasing patchy pneumonia in the right lower lobe.   Electronically Signed   By: Evangeline Dakin M.D.   On: 08/21/2014 10:16    Assessment & Plan:   Peripheral T-cell lymphoma  Tagretin was placed on hold on admission Overall, the skin is improving  Respiratory failure secondary to sepsis She is currently on  noninvasive assistance with BiPAP under primary team directives, along with antibiotics and nebulizers,   The patient had advanced directives for DNR  Sepsis from urinary tract infection She is currently on broad-spectrum intravenous antibiotics.  Anemia in neoplastic disease No need to consider transfusion unless <8 g  Leukocytosis Due to sepsis with Klebsiella pneumoniae and bacteremia, respiratory failure, malignancy She is afebrile Continue supportive care with antibiotics, monitor closely.  DVT prophylaxis On Lovenox  Discharge planning The family member inquired about her prognosis. I do not believe that the diagnosis of peripheral the lymphoma should impact her longevity.  She is currently on maximal supportive care  Other medical issues as per primary team  [facility] E, PA-C 08/22/2014  7:27 AM  Karely Hurtado, MD 08/22/2014

## 2014-08-22 NOTE — Progress Notes (Signed)
[facility] for Infectious Disease    Date of Admission:  08/16/2014   Total days of antibiotics 7        Day 3 imipenem           ID: Sara [name] is a 80 y.o. 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 On bipap laying in bed. Denies pain. Underwent CT that found right RUL infiltrate with large pleural effusion  Medications:  . albuterol  2.5 mg Nebulization Q6H  . antiseptic oral rinse  7 mL Mouth Rinse BID  . carvedilol  12.5 mg Oral BID WC  . diltiazem  120 mg Oral Q breakfast  . [START ON 08/23/2014] enoxaparin (LOVENOX) injection  1 mg/kg Subcutaneous Q24H  . famotidine  20 mg Oral Daily  . feeding supplement (ENSURE)  1 Container Oral BID BM  . folic acid  1 mg Oral Q breakfast  . imipenem-cilastatin  250 mg Intravenous Q8H  . isosorbide mononitrate  30 mg Oral Q breakfast  . omega-3 acid ethyl esters  1 g Oral Daily  . polyethylene glycol  17 g Oral QODAY  . polyvinyl alcohol  1 drop Both Eyes BID  . senna-docusate  1 tablet Oral BID  . sodium chloride  3 mL Intravenous Q12H  . triamcinolone cream   Topical UD    Objective: Vital signs in last 24  hours: Temp:  [97 F (36.1 C)-98.4 F (36.9 C)] 97 F (36.1 C) (12/10 1200) Pulse Rate:  [51-89] 75 (12/10 1600) Resp:  [18-29] 25 (12/10 1600) BP: (97-179)/(43-95) 161/87 mmHg (12/10 1600) SpO2:  [94 %-100 %] 97 % (12/10 1600) FiO2 (%):  [35 %] 35 % (12/10 1431) Physical Exam  Constitutional: Frail elderly female. No distress.  HENT:  Mouth/Throat: bipap in place Pulm= bilateral rhonchi Cors= nl s1,s2 no g/m/r Abd= mildly distended. Hyperactive bowel sounds Skin = unchanged from baseline, flaky dry on hands   Lab Results  Recent Labs  08/21/14 0338 08/21/14 1400 08/22/14 0352  WBC 21.7*  --  23.2*  HGB 9.1*  --  9.3*  HCT 29.3*  --  29.9*  NA 151* 148* 148*  K 4.1 4.2 4.3  CL 112 111 111  CO2 25 24 25   BUN 55* 55* 56*  CREATININE 1.30* 1.17* 1.20*    Microbiology: 12/4 blood cx kleb pneumonia x 2 sets 12/5 urine cx ngtd Studies/Results: Ct Chest Wo Contrast  08/22/2014   CLINICAL DATA:  80 year old female with history of right lower lobe pneumonia. Shortness breath and weakness. History of lymphoma.  EXAM: CT CHEST WITHOUT CONTRAST  TECHNIQUE: Multidetector CT imaging of the chest was performed following the standard protocol  without IV contrast.  COMPARISON:  Chest CT 10/18/2012.  FINDINGS: Mediastinum: Heart size is mildly enlarged. There is no significant pericardial fluid, thickening or pericardial calcification. Severe mitral annular calcifications, likely related to remote rheumatic fever. Calcifications of the aortic valve. There is atherosclerosis of the thoracic aorta, the great vessels of the mediastinum and the coronary arteries, including calcified atherosclerotic plaque in the left main, left anterior descending, left circumflex and right coronary arteries. Multiple borderline enlarged and mildly enlarged right hilar and mediastinal lymph nodes measuring up to 11 mm in short axis in the low right paratracheal station. The full extent of right hilar adenopathy  is poorly evaluated on today's non contrast CT examination. Esophagus is unremarkable in appearance.  Lungs/Pleura: Extensive airspace consolidation is noted in the right upper lobe where there are multiple air bronchograms, compatible with pneumonia. There is a large right-sided pleural effusion which layers dependently and is simple in appearance. This is associated with extensive passive atelectasis in the right lower lobe. Trace left pleural effusion. There is multifocal opacification of the bronchi in the lungs bilaterally, most notable in the left lower lobe, which likely represents retained secretions. 5 mm nodule in the lateral segment of the right middle lobe (image 34 of series 5), unchanged compared to prior study 10/18/2012. Dynamic compression and enlargement of the trachea and mainstem bronchi with respiration, indicative of tracheobronchomalacia.  Upper Abdomen: Extensive atherosclerosis. Atrophy of the left kidney. Right kidney is not visualize.  Musculoskeletal: There are no aggressive appearing lytic or blastic lesions noted in the visualized portions of the skeleton. Chronic compression fracture of T12 with post vertebroplasty changes and approximately 60% loss of anterior vertebral body height is unchanged.  IMPRESSION: 1. Lobar consolidation of much of the right upper lobe compatible with acute pneumonia. There is a large parapneumonic right-sided pleural effusion associated with extensive passive atelectasis in the right lower lobe. 2. Evidence of tracheobronchomalacia. 3. Borderline enlarged and mildly enlarged right hilar and mediastinal lymph nodes are presumably reactive. 4. 5 mm pulmonary nodule in the lateral segment of the right middle lobe, unchanged compared to prior study 10/18/2012, statistically benign. 5. Atherosclerosis, including left main and 3 vessel coronary artery disease. 6. There are calcifications of the aortic valve and mitral annulus. Echocardiographic correlation for  evaluation of potential valvular dysfunction may be warranted if clinically indicated.   Electronically Signed   By: Vinnie Langton M.D.   On: 08/22/2014 14:01   Dg Chest Port 1 View  08/21/2014   CLINICAL DATA:  Follow-up right upper lobe pneumonia.  EXAM: PORTABLE CHEST - 1 VIEW  COMPARISON:  Portable chest x-rays 08/19/2014 dating back to 08/16/2014, 10/05/2012. Two-view chest x-ray 08/02/2014, 07/22/2012. CTA chest 10/18/2012.  FINDINGS: Cardiac silhouette mildly to moderately enlarged. Pulmonary vascularity normal without evidence of pulmonary edema. Dense consolidation in the right upper lobe with progressive volume loss since the examination 5 days ago, new since 08/02/2014. Because of the volume loss, the right hilar and perihilar region is now better visible and there is a masslike opacity in this region. Bilateral pleural effusions and consolidation in the lower lobes, left greater than right, unchanged on the left and increasing on the right since the examination 2 days ago.  IMPRESSION: 1. Stable dense right upper lobe pneumonia associated with progressive volume loss over the past 5 days. A possible right hilar or perihilar mass is now all better visible due to the progressive right upper lobe volume loss. CT of the chest with contrast and  possibly bronchoscopy may be helpful in further evaluation. 2. Stable small bilateral pleural effusions. Stable passive atelectasis versus pneumonia in the left lower lobe. Increasing patchy pneumonia in the right lower lobe.   Electronically Signed   By: Evangeline Dakin M.D.   On: 08/21/2014 10:16     Assessment/Plan: kleb pneumonaie +/- aspiration vs. Post obstruction pna = Will change antibiotics to imipenem to cover aspiration pneumonia but will also cover klebsiella bacteremia ( ideally would do amp/sub but patient has unknown pcn allergy).   - CT shows large pleural effusion, continue to monitor for now - can have picc line placed - will repeat  blood culture to document clearance   Agree with palliative consultation to see if family needs to coordinate with hospice in case the patient is having worsening respiratory distress and high chance of requiring intubation. Her daughter reports that they would not want her intubated  Baxter Flattery [facility] for Infectious Diseases Cell: [phone] Pager: [phone]  08/22/2014, 5:22 PM

## 2014-08-22 NOTE — Progress Notes (Signed)
PULMONARY / CRITICAL CARE MEDICINE   Name: Sara [name] MRN: [id number] DOB: 1918-11-27    ADMISSION DATE:  08/16/2014 CONSULTATION DATE:  12/8  REFERRING MD :  Doyle Askew   CHIEF COMPLAINT:  Acute respiratory failure HCAP   INITIAL PRESENTATION:  80 year old female who resides at a SNF w/ sig medical h/o CAF, PAD, mild dementia (but cognitive fxn fairly intact), prior AKA on right, and chronic diastolic HF Admitted on 93/8 from the SNF w/ RUL PNA. She was admitted to the SDU, placed on empiric abx and NIPPV.Culture data was positive for Klebsiella PNA. ID in 2/2 blood cultures. Infectious disease was consulted. She had made very slow progress clinically and radiographically. PCCM asked to see on 12/8 as she still was requiring NIPPV   STUDIES:    SIGNIFICANT EVENTS:    SUBJECTIVE: required bipap overnight Bradycardic this am Afebrile, hypertensive  VITAL SIGNS: Temp:  [97.3 F (36.3 C)-98.4 F (36.9 C)] 98.4 F (36.9 C) (12/10 0304) Pulse Rate:  [70-102] 79 (12/10 0400) Resp:  [19-28] 24 (12/10 0400) BP: (139-179)/(61-89) 173/89 mmHg (12/10 0738) SpO2:  [95 %-100 %] 95 % (12/10 0400) FiO2 (%):  [35 %] 35 % (12/10 0219)  4 liters  HEMODYNAMICS:   VENTILATOR SETTINGS: Vent Mode:  [-]  FiO2 (%):  [35 %] 35 % INTAKE / OUTPUT:  Intake/Output Summary (Last 24 hours) at 08/22/14 0858 Last data filed at 08/22/14 0500  Gross per 24 hour  Intake   1000 ml  Output    410 ml  Net    590 ml    PHYSICAL EXAMINATION: General:  80 year old female, resting comfortably on 4 liters Neuro:  Awake, generalized weakness. Follows commands  HEENT: [place], no JVD  Cardiovascular:  rrr Lungs:  Basilar rales, mild accessory muscle use Abdomen:  Soft, non-tender  Musculoskeletal:  Intact  Skin:  Intact   LABS:  CBC  Recent Labs Lab 08/20/14 0331 08/21/14 0338 08/22/14 0352  WBC 18.5* 21.7* 23.2*  HGB 9.5* 9.1* 9.3*  HCT 30.0* 29.3* 29.9*  PLT 188 198 266   Coag's No results  for input(s): APTT, INR in the last 168 hours. BMET  Recent Labs Lab 08/21/14 0338 08/21/14 1400 08/22/14 0352  NA 151* 148* 148*  K 4.1 4.2 4.3  CL 112 111 111  CO2 25 24 25   BUN 55* 55* 56*  CREATININE 1.30* 1.17* 1.20*  GLUCOSE 142* 219* 113*   Electrolytes  Recent Labs Lab 08/21/14 0338 08/21/14 1400 08/22/14 0352  CALCIUM 9.6 9.7 9.5   Sepsis Markers  Recent Labs Lab 08/16/14 1030 08/16/14 2108  LATICACIDVEN 2.72* 1.8   ABG  Recent Labs Lab 08/16/14 1807  PHART 7.284*  PCO2ART 54.9*  PO2ART 72.1*   Liver Enzymes  Recent Labs Lab 08/16/14 1020 08/17/14 0350  AST 20 20  ALT 11 10  ALKPHOS 50 35*  BILITOT 0.8 0.6  ALBUMIN 3.0* 2.5*   Cardiac Enzymes  Recent Labs Lab 08/16/14 1020  PROBNP 19573.0*   Glucose  Recent Labs Lab 08/17/14 0751 08/20/14 0800 08/21/14 0802 08/22/14 0747  GLUCAP 98 137* 131* 128*    Imaging Dg Chest Port 1 View  08/21/2014   CLINICAL DATA:  Follow-up right upper lobe pneumonia.  EXAM: PORTABLE CHEST - 1 VIEW  COMPARISON:  Portable chest x-rays 08/19/2014 dating back to 08/16/2014, 10/05/2012. Two-view chest x-ray 08/02/2014, 07/22/2012. CTA chest 10/18/2012.  FINDINGS: Cardiac silhouette mildly to moderately enlarged. Pulmonary vascularity normal without evidence of  pulmonary edema. Dense consolidation in the right upper lobe with progressive volume loss since the examination 5 days ago, new since 08/02/2014. Because of the volume loss, the right hilar and perihilar region is now better visible and there is a masslike opacity in this region. Bilateral pleural effusions and consolidation in the lower lobes, left greater than right, unchanged on the left and increasing on the right since the examination 2 days ago.  IMPRESSION: 1. Stable dense right upper lobe pneumonia associated with progressive volume loss over the past 5 days. A possible right hilar or perihilar mass is now all better visible due to the progressive  right upper lobe volume loss. CT of the chest with contrast and possibly bronchoscopy may be helpful in further evaluation. 2. Stable small bilateral pleural effusions. Stable passive atelectasis versus pneumonia in the left lower lobe. Increasing patchy pneumonia in the right lower lobe.   Electronically Signed   By: Evangeline Dakin M.D.   On: 08/21/2014 10:16     ASSESSMENT / PLAN:  PULMONARY OETT A: acute respiratory failure in the setting of HCAP vs aspiration -slow progress  P:   Cont NIPPV PRN mobilize Scheduled BDs Avoid sedating meds  CT chest   CARDIOVASCULAR CVL A:  Sepsis in the setting of bacteremia  Chronic diastolic HF HTN PAF -brady on coreg & cardizem  P:  Cont tele monitoring  Keep euvolemic  Hold parameters on BB, CCB  RENAL A:  AKI: scr had improved after holding lasix  Hypernatremia - resolving P:   Avoid hypotension in setting of active diuresis  Renal dose meds F/u chemistry Hold further lasix for now  GASTROINTESTINAL A:   Possible dysphagia P:   Aspiration precautions  PPI   HEMATOLOGIC A:   H/o T cell lymphoma of multiple LNs w/ multiple sites  Anemia of chronic illness  Persistent leucocytosis P:  Trend cbc [place] LMWH  INFECTIOUS A:   Sepsis w/ klebsiella bacteremia, likely from UTI 11/21.  Aspiration vs HCAP  P:   BCx2  12/6: Klebsiella pneumoniae- R to ampi UC 12/5: neg, 11/21 klebsiella  maxipime 12/4>>>12/9 Imipenem 12/9 >> Defer to ID   ENDOCRINE A:   No acute  P:   Trend glucose   NEUROLOGIC A:  Acute encephalopathy  P:   RASS goal: -0 D/c'd sedating meds Supportive care    FAMILY  - Update: family updated at length at bedside 12/9  - Inter-disciplinary family meet or Palliative Care meeting due by: 12/15  Summary - Able to tolerate off bipap this am, brady this am due to coreg/dilzem,Na better& leucocytosis persists. Proceed with CT chest , Clarify intubation status from daughter  Care during the  described time interval was provided by me and/or other providers on the critical care team.  I have reviewed this patient's available data, including medical history, events of note, physical examination and test results as part of my evaluation  Rigoberto Noel. MD

## 2014-08-22 NOTE — Progress Notes (Addendum)
Patient ID: Sara [name], female   DOB: Apr 26, 1919, 80 y.o.   MRN: [id number]  [facility] PROGRESS NOTE  Sara [name] TIW:[id number] DOB: 11/29/1918 DOA: 08/16/2014 PCP: Estill Dooms, MD   Brief narrative: 80 year old female with past medical history of PAD status post stenting (LICA 25/0539), right AKA, paroxysmal atrial fibrillation on AC with pradaxa,dementia, hypertension, chronic diastolic CHF (2 D ECHO in 08/2014 with EF of 55%, wheelchair bound who presented from [place] to [facility] ED with reports of not feeling very well and reports of fevers. She is not a very good historian due to dementia. Apparently no respiratory distress but on admission patient was found to have O2 saturation of 86%. No other complaints.  In ED, BP was 71/42 to 141/49, HR 59-91, RR 25 and T max 102 F. Oxygen saturation was 86% on room air, improved to 92% with [facility] oxygen support. CXR consistent with right upper lung lobe pneumonia. She was started on broad spectrum abx, cefepime and vanco for sepsis and pneumonia. She is DNR and stable with current BP of 141/49 to go to telemetry floor.   Assessment and Plan:   Principal Problem: Severe sepsis / Leukocytosis / HCAP  - sepsis criteria met with initial vitals hypotension, tachypnea, bradycardia, T max 102 F, leukocytosis, evidence of end organ damange which includes hypoxia and ARF, evidence of PNA on CXR. - blood cultures positive for Klebsiella (source possible from recent UTI, urine culture from 11/26 positive for Klebsiella)  - started broad spectrum antibiotics Vancomycin and Maxipime. Vanc d/c per ID 12/07, maxipime d/c 12/08 - ABX changed to Primaxin to cover for aspiration PNA 12/08 - pt afebrile over the past 48 hours, still with mild dyspnea and accessory muscles use - nebulizer treatments as needed for shortness of breath  - keep in SDU  - CT chest pending   Active Problems: Chronic diastolic HF (heart failure) - last BNP in  12/2013 with BNP in 300-400 range. 2 D ECHO in 08/2013 showed EF 55%. - weight from admission: 136 lbs --> 151 lbs --> 143 lbs --> 144 this AM, stable over the past 24 hours  - stopped IVF 12/06 - pt started on Lasix 20 mg IV BID 12/6, holding Lasix since 12/08 - continue to monitor daily weights, strict I's and O's Hypernatremia - possibly pre renal - holding IVF to avoid volume overload, Na trending down  HTN (hypertension) - all antihypertensives on hold on admission due to hypotension  - BP stabilizing  - resumed Coreg 12. 5 mg PO BID and Cardizem 12/06, may need to hold due to mild bradycardia  - resumed Imdur 12/07 - continue to hold Lisinopril and Lasix  Leukocytosis - pt with known Lymphoma so wondering if this is contributing  - appreciate Dr. Alvy Bimler assistance  PAF (paroxysmal atrial fibrillation) / Chronic anticoagulation- Pradaxa - on pradaxa but on hold due to worsening renal failure, low GFR S/P R AKA due to PAD and non healing wound / PAD - LICA stent 7/67 - stable - continue gabapentin  Stroke, acute, Rt brain, embolic March 3419 - pt is on pradaxa but still on hold due to worsening renal failure. 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 - hold targretin in the setting of an acute infection, sepsis ARF (acute renal failure)  -  continue to hold lisinopril and lasix  - repeat BMP in AM Anemia of chronic disease - secondary to peripheral T cell lymphoma and chemotherapy  - hemoglobin is 11 on admission and slightly down since admission - no signs of active bleeding  - no current indications for transfusion  GERD (gastroesophageal reflux disease) - continue Pepcid 40 mg daily  Hypokalemia - supplemented and WNL this AM Severe PCM - in the context of acute illness - advance diet as pt  able to tolerate  - aspiration precautions  Functional quadriplegia - bed bound since admission - will attempt to get OOB to chair   DVT prophylaxis:  - enoxaparin subQ since admission   Code Status: Partial, no intubation  Family Communication: family at bedside  Disposition Plan: Remains inpatient   IV Access:    Peripheral IV Procedures and diagnostic studies:    CXR 08/21/2014 Stable dense right upper lobe pneumonia associated with progressive volume loss over the past 5 days. A possible right hilar or perihilar mass is now all better visible due to the progressive right upper lobe volume loss. CT of the chest with contrast and possibly bronchoscopy may be helpful in further evaluation. Stable small bilateral pleural effusions. Stable passive atelectasis versus pneumonia in the left lower lobe. Increasing patchy pneumonia in the right lower lobe  CXR 08/19/2014 Persistent RUL airspace disease, ? central obstructing lesion. small pleural effusions, atelectasis.   CXR12/01/2014 Persistent dense consolidative right upper lobe pneumonia. Background COPD/ emphysema.   CXR12/12/2013 Increasing right upper lobe pneumonia.   CXR 08/16/2014 Right upper lobe pneumonia. Post treatment radiographs recommended to document resolution.  Medical Consultants:    PCCM  ID Other Consultants:    Physical therapy  Anti-Infectives:    Vancomycin 12/04 --> 12/07  Maxipime 12/04 --> 12/08  Primaxin 12/08 -->  [name], MD  [facility] Pager [phone]  If 7PM-7AM, please contact night-coverage [email or web address] Password [facility] 08/22/2014, 11:31 AM   LOS: 6 days   HPI/Subjective: No events overnight.   Objective: Filed Vitals:   08/22/14 0400 08/22/14 0738 08/22/14 0800 08/22/14 0900  BP: 179/71 173/89 134/95   Pulse: 79  89 51  Temp:   97.8 F (36.6 C)   TempSrc:   Axillary   Resp: $Remo'24  18 20  'VvCOt$ Height:      Weight:      SpO2: 95%  96%  94%    Intake/Output Summary (Last 24 hours) at 08/22/14 1131 Last data filed at 08/22/14 0947  Gross per 24 hour  Intake   1110 ml  Output    535 ml  Net    575 ml    Exam:   General:  Pt is alert, follows some commands appropriately, not in acute distress, on [place], still confused   Cardiovascular: Irregular rhythm, bradycardic, no rubs, no gallops  Respiratory: Diminished air movement at bases, mild tachypnea   Abdomen: Soft, non tender, non distended, bowel sounds present, no guarding   Data Reviewed: Basic Metabolic Panel:  Recent Labs Lab 08/19/14 0345 08/20/14 0331 08/21/14 0338 08/21/14 1400 08/22/14 0352  NA 147 145 151* 148* 148*  K 4.5 3.9 4.1 4.2 4.3  CL 109 107 112 111 111  CO2 $Re'23 25 25 24 25  'Qvh$ GLUCOSE 118* 173* 142* 219* 113*  BUN 47* 49* 55* 55* 56*  CREATININE 1.23* 1.36* 1.30* 1.17* 1.20*  CALCIUM 9.4 9.6 9.6 9.7 9.5   Liver Function Tests:  Recent Labs Lab 08/16/14 1020 08/17/14 0350  AST  20 20  ALT 11 10  ALKPHOS 50 35*  BILITOT 0.8 0.6  PROT 6.9 6.0  ALBUMIN 3.0* 2.5*   CBC:  Recent Labs Lab 08/16/14 1124  08/18/14 0400 08/19/14 0345 08/20/14 0331 08/21/14 0338 08/22/14 0352  WBC 16.0*  < > 22.8* 24.1* 18.5* 21.7* 23.2*  NEUTROABS 10.5*  --   --   --   --   --   --   HGB 11.0*  < > 10.2* 10.6* 9.5* 9.1* 9.3*  HCT 32.8*  < > 31.6* 33.6* 30.0* 29.3* 29.9*  MCV 92.1  < > 94.3 94.6 94.6 96.7 96.8  PLT 201  < > 197 181 188 198 266  < > = values in this interval not displayed.    Recent Labs Lab 08/17/14 0751 08/20/14 0800 08/21/14 0802 08/22/14 0747  GLUCAP 98 137* 131* 128*    Recent Results (from the past 240 hour(s))  Blood culture (routine x 2)     Status: None   Collection Time: 08/16/14 10:20 AM  Result Value Ref Range Status   Specimen Description BLOOD RIGHT ANTECUBITAL  Final   Special Requests BOTTLES DRAWN AEROBIC AND ANAEROBIC 5ML  Final   Culture  Setup Time   Final    08/16/2014 12:39 Performed at  [facility]    Culture   Final    KLEBSIELLA PNEUMONIAE Note: Gram Stain Report Called to,Read Back By and Verified With: TEVET WOODS RN [id number] 08/16/14 BY EDMOJ Performed at [facility]    Report Status 08/18/2014 FINAL  Final   Organism ID, Bacteria KLEBSIELLA PNEUMONIAE  Final      Susceptibility   Klebsiella pneumoniae - MIC*    AMPICILLIN >=32 RESISTANT Resistant     AMPICILLIN/SULBACTAM 4 SENSITIVE Sensitive     CEFAZOLIN <=4 SENSITIVE Sensitive     CEFEPIME <=1 SENSITIVE Sensitive     CEFTAZIDIME <=1 SENSITIVE Sensitive     CEFTRIAXONE <=1 SENSITIVE Sensitive     CIPROFLOXACIN <=0.25 SENSITIVE Sensitive     GENTAMICIN <=1 SENSITIVE Sensitive     IMIPENEM <=0.25 SENSITIVE Sensitive     PIP/TAZO <=4 SENSITIVE Sensitive     TOBRAMYCIN <=1 SENSITIVE Sensitive     TRIMETH/SULFA <=20 SENSITIVE Sensitive     * KLEBSIELLA PNEUMONIAE  Blood culture (routine x 2)     Status: None   Collection Time: 08/16/14 10:25 AM  Result Value Ref Range Status   Specimen Description BLOOD LEFT ANTECUBITAL  Final   Special Requests BOTTLES DRAWN AEROBIC AND ANAEROBIC 3ML  Final   Culture  Setup Time   Final    08/16/2014 12:39 Performed at [facility]    Culture   Final    KLEBSIELLA PNEUMONIAE Note: SUSCEPTIBILITIES PERFORMED ON PREVIOUS CULTURE WITHIN THE LAST 5 DAYS. Note: Gram Stain Report Called to,Read Back By and Verified With: TEVET WOODS RN [id number] Performed at [facility]    Report Status 08/18/2014 FINAL  Final  MRSA PCR Screening     Status: None   Collection Time: 08/16/14  2:10 PM  Result Value Ref Range Status   MRSA by PCR NEGATIVE NEGATIVE Final    Comment:        The GeneXpert MRSA Assay (FDA approved for NASAL specimens only), is one component of a comprehensive MRSA colonization surveillance program. It is not intended to diagnose MRSA infection nor to guide or monitor treatment for MRSA infections.   Culture, Urine     Status:  None  Collection Time: 08/17/14 12:28 PM  Result Value Ref Range Status   Specimen Description URINE, RANDOM  Final   Special Requests NONE  Final   Culture  Setup Time   Final    08/17/2014 20:01 Performed at [place] Performed at [facility]   Final   Culture NO GROWTH Performed at [facility]   Final   Report Status 08/18/2014 FINAL  Final     Scheduled Meds: . albuterol  2.5 mg Nebulization Q6H  . antiseptic oral rinse  7 mL Mouth Rinse BID  . carvedilol  12.5 mg Oral BID WC  . diltiazem  120 mg Oral Q breakfast  . enoxaparin (LOVENOX) injection  1 mg/kg Subcutaneous Q24H  . famotidine  20 mg Oral Daily  . feeding supplement (ENSURE)  1 Container Oral BID BM  . folic acid  1 mg Oral Q breakfast  . imipenem-cilastatin  250 mg Intravenous Q8H  . isosorbide mononitrate  30 mg Oral Q breakfast  . omega-3 acid ethyl esters  1 g Oral Daily  . polyethylene glycol  17 g Oral QODAY  . polyvinyl alcohol  1 drop Both Eyes BID  . senna-docusate  1 tablet Oral BID  . sodium chloride  3 mL Intravenous Q12H  . triamcinolone cream   Topical UD   Continuous Infusions:

## 2014-08-23 ENCOUNTER — Inpatient Hospital Stay (HOSPITAL_COMMUNITY): Payer: Medicare Other

## 2014-08-23 DIAGNOSIS — J90 Pleural effusion, not elsewhere classified: Secondary | ICD-10-CM | POA: Insufficient documentation

## 2014-08-23 DIAGNOSIS — D72829 Elevated white blood cell count, unspecified: Secondary | ICD-10-CM

## 2014-08-23 DIAGNOSIS — N19 Unspecified kidney failure: Secondary | ICD-10-CM

## 2014-08-23 DIAGNOSIS — D630 Anemia in neoplastic disease: Secondary | ICD-10-CM

## 2014-08-23 LAB — BASIC METABOLIC PANEL
Anion gap: 11 (ref 5–15)
BUN: 58 mg/dL — ABNORMAL HIGH (ref 6–23)
CHLORIDE: 114 meq/L — AB (ref 96–112)
CO2: 26 mEq/L (ref 19–32)
CREATININE: 1.26 mg/dL — AB (ref 0.50–1.10)
Calcium: 9.7 mg/dL (ref 8.4–10.5)
GFR calc Af Amer: 41 mL/min — ABNORMAL LOW (ref >90)
GFR calc non Af Amer: 36 mL/min — ABNORMAL LOW (ref >90)
GLUCOSE: 115 mg/dL — AB (ref 70–99)
POTASSIUM: 4.8 meq/L (ref 3.7–5.3)
Sodium: 151 mEq/L — ABNORMAL HIGH (ref 137–147)

## 2014-08-23 LAB — CBC
HCT: 32.4 % — ABNORMAL LOW (ref 36.0–46.0)
HEMOGLOBIN: 9.9 g/dL — AB (ref 12.0–15.0)
MCH: 29.9 pg (ref 26.0–34.0)
MCHC: 30.6 g/dL (ref 30.0–36.0)
MCV: 97.9 fL (ref 78.0–100.0)
Platelets: 330 10*3/uL (ref 150–400)
RBC: 3.31 MIL/uL — ABNORMAL LOW (ref 3.87–5.11)
RDW: 14.4 % (ref 11.5–15.5)
WBC: 25.2 10*3/uL — ABNORMAL HIGH (ref 4.0–10.5)

## 2014-08-23 LAB — DIFFERENTIAL
BASOS PCT: 0 % (ref 0–1)
Basophils Absolute: 0 10*3/uL (ref 0.0–0.1)
EOS PCT: 1 % (ref 0–5)
Eosinophils Absolute: 0.3 10*3/uL (ref 0.0–0.7)
Lymphocytes Relative: 38 % (ref 12–46)
Lymphs Abs: 9.6 10*3/uL — ABNORMAL HIGH (ref 0.7–4.0)
MONO ABS: 2 10*3/uL — AB (ref 0.1–1.0)
Monocytes Relative: 8 % (ref 3–12)
NEUTROS ABS: 13.3 10*3/uL — AB (ref 1.7–7.7)
Neutrophils Relative %: 53 % (ref 43–77)

## 2014-08-23 LAB — PROTEIN, BODY FLUID: Total protein, fluid: 2.3 g/dL

## 2014-08-23 LAB — PROTEIN, TOTAL: Total Protein: 6.6 g/dL (ref 6.0–8.3)

## 2014-08-23 LAB — LACTATE DEHYDROGENASE, PLEURAL OR PERITONEAL FLUID: LD FL: 258 U/L — AB (ref 3–23)

## 2014-08-23 LAB — BODY FLUID CELL COUNT WITH DIFFERENTIAL
Lymphs, Fluid: 16 %
Monocyte-Macrophage-Serous Fluid: 11 % — ABNORMAL LOW (ref 50–90)
Neutrophil Count, Fluid: 73 % — ABNORMAL HIGH (ref 0–25)
Total Nucleated Cell Count, Fluid: 1839 cu mm — ABNORMAL HIGH (ref 0–1000)

## 2014-08-23 LAB — LACTATE DEHYDROGENASE: LDH: 285 U/L — ABNORMAL HIGH (ref 94–250)

## 2014-08-23 LAB — GLUCOSE, CAPILLARY: Glucose-Capillary: 140 mg/dL — ABNORMAL HIGH (ref 70–99)

## 2014-08-23 MED ORDER — HYDRALAZINE HCL 20 MG/ML IJ SOLN: 10.0000 mg | INTRAMUSCULAR | Status: DC | PRN

## 2014-08-23 MED ORDER — LABETALOL HCL 5 MG/ML IV SOLN
10.0000 mg | INTRAVENOUS | Status: DC | PRN
  Administered 2014-08-23: 10 mg via INTRAVENOUS
  Administered 2014-08-24 – 2014-08-26 (×2): 20 mg via INTRAVENOUS
  Filled 2014-08-23 (×3): qty 4

## 2014-08-23 MED ORDER — HYDRALAZINE HCL 20 MG/ML IJ SOLN
10.0000 mg | Freq: Once | INTRAMUSCULAR | Status: AC
  Administered 2014-08-23: 10 mg via INTRAVENOUS

## 2014-08-23 MED ORDER — HYDRALAZINE HCL 20 MG/ML IJ SOLN
INTRAMUSCULAR | Status: AC
  Filled 2014-08-23: qty 1

## 2014-08-23 MED ORDER — HYDRALAZINE HCL 20 MG/ML IJ SOLN
INTRAMUSCULAR | Status: AC
Start: 2014-08-23 — End: 2014-08-23
  Filled 2014-08-23: qty 1

## 2014-08-23 MED ORDER — HYDRALAZINE HCL 20 MG/ML IJ SOLN
INTRAMUSCULAR | Status: AC
  Administered 2014-08-23: 10 mg
  Filled 2014-08-23: qty 1

## 2014-08-23 MED ORDER — DEXTROSE 5 % IV SOLN
INTRAVENOUS | Status: DC
  Administered 2014-08-23: 12:00:00 via INTRAVENOUS

## 2014-08-23 NOTE — Patient Care Conference (Signed)
Patient calling out to RN, found pulling on the BIPAP mask saying " I don't want all these". E-Link MD notified of patient not wanting the BIPAP, order for BIPAP changed to PRN. BIPAP taken off pt face and pt placed on 3L [place]. Pt will continue to be monitored for acute respiratory change.

## 2014-08-23 NOTE — Progress Notes (Signed)
[place] for [facility]ious [facility]    Date of Admission:  08/16/2014   Total days of antibiotics 8        Day 4 imipenem           ID: Sara [name] is a 80 y.o. 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 On bipap intermittently. Underwent thoracentesis this afternoon with 1255mL removed  Medications:  . albuterol  2.5 mg Nebulization Q6H  . antiseptic oral rinse  7 mL Mouth Rinse BID  . carvedilol  12.5 mg Oral BID WC  . diltiazem  120 mg Oral Q breakfast  . enoxaparin (LOVENOX) injection  1 mg/kg Subcutaneous Q24H  . famotidine  20 mg Oral Daily  . feeding supplement (ENSURE)  1 Container Oral BID BM  . folic acid  1 mg Oral Q breakfast  . imipenem-cilastatin  250 mg Intravenous Q8H  . isosorbide mononitrate  30 mg Oral Q breakfast  . omega-3 acid ethyl esters  1 g Oral Daily  . polyethylene glycol  17 g Oral QODAY  . polyvinyl alcohol  1 drop Both Eyes BID  . senna-docusate  1 tablet Oral BID  . sodium chloride  3 mL Intravenous Q12H  . triamcinolone cream   Topical UD     Objective: Vital signs in last 24 hours: Temp:  [97.1 F (36.2 C)-98.1 F (36.7 C)] 97.9 F (36.6 C) (12/11 0800) Pulse Rate:  [56-114] 107 (12/11 1200) Resp:  [22-32] 25 (12/11 1200) BP: (131-218)/(68-116) 154/89 mmHg (12/11 1241) SpO2:  [94 %-98 %] 98 % (12/11 1200) FiO2 (%):  [35 %] 35 % (12/11 0800) Weight:  [141 lb 5 oz (64.1 kg)] 141 lb 5 oz (64.1 kg) (12/11 0500) Physical Exam  Constitutional: Frail elderly female. No distress.  HENT:  Mouth/Throat: bipap in place Pulm= bilateral rhonchi Cors= nl s1,s2 no g/m/r Abd= mildly distended. Hyperactive bowel sounds Skin = unchanged from baseline, flaky dry on hands   Lab Results  Recent Labs  08/22/14 0352 08/23/14 0330  WBC 23.2* 25.2*  HGB 9.3* 9.9*  HCT 29.9* 32.4*  NA 148* 151*  K 4.3 4.8  CL 111 114*  CO2 25 26  BUN 56* 58*  CREATININE 1.20* 1.26*    Microbiology: 12/4 blood cx kleb pneumonia x 2 sets 12/5 urine cx ngtd Studies/Results: Ct Chest Wo Contrast  08/22/2014   CLINICAL DATA:  80 year old female with history of right lower lobe pneumonia. Shortness breath and weakness. History of lymphoma.  EXAM: CT CHEST WITHOUT CONTRAST  TECHNIQUE: Multidetector CT imaging of the chest was performed following the  standard protocol without IV contrast.  COMPARISON:  Chest CT 10/18/2012.  FINDINGS: Mediastinum: Heart size is mildly enlarged. There is no significant pericardial fluid, thickening or pericardial calcification. Severe mitral annular calcifications, likely related to remote rheumatic fever. Calcifications of the aortic valve. There is atherosclerosis of the thoracic aorta, the great vessels of the mediastinum and the coronary arteries, including calcified atherosclerotic plaque in the left main, left anterior descending, left circumflex and right coronary arteries. Multiple borderline enlarged and mildly enlarged right hilar and mediastinal lymph nodes measuring up to 11 mm in short axis in the low right  paratracheal station. The full extent of right hilar adenopathy is poorly evaluated on today's non contrast CT examination. Esophagus is unremarkable in appearance.  Lungs/Pleura: Extensive airspace consolidation is noted in the right upper lobe where there are multiple air bronchograms, compatible with pneumonia. There is a large right-sided pleural effusion which layers dependently and is simple in appearance. This is associated with extensive passive atelectasis in the right lower lobe. Trace left pleural effusion. There is multifocal opacification of the bronchi in the lungs bilaterally, most notable in the left lower lobe, which likely represents retained secretions. 5 mm nodule in the lateral segment of the right middle lobe (image 34 of series 5), unchanged compared to prior study 10/18/2012. Dynamic compression and enlargement of the trachea and mainstem bronchi with respiration, indicative of tracheobronchomalacia.  Upper Abdomen: Extensive atherosclerosis. Atrophy of the left kidney. Right kidney is not visualize.  Musculoskeletal: There are no aggressive appearing lytic or blastic lesions noted in the visualized portions of the skeleton. Chronic compression fracture of T12 with post vertebroplasty changes and approximately 60% loss of anterior vertebral body height is unchanged.  IMPRESSION: 1. Lobar consolidation of much of the right upper lobe compatible with acute pneumonia. There is a large parapneumonic right-sided pleural effusion associated with extensive passive atelectasis in the right lower lobe. 2. Evidence of tracheobronchomalacia. 3. Borderline enlarged and mildly enlarged right hilar and mediastinal lymph nodes are presumably reactive. 4. 5 mm pulmonary nodule in the lateral segment of the right middle lobe, unchanged compared to prior study 10/18/2012, statistically benign. 5. Atherosclerosis, including left main and 3 vessel coronary artery disease. 6. There are calcifications of the  aortic valve and mitral annulus. Echocardiographic correlation for evaluation of potential valvular dysfunction may be warranted if clinically indicated.   Electronically Signed   By: Vinnie Langton M.D.   On: 08/22/2014 14:01   Dg Chest Port 1 View  08/23/2014   CLINICAL DATA:  Pneumonia. Respiratory distress and pleural effusions. Lymphoma.  EXAM: PORTABLE CHEST - 1 VIEW  COMPARISON:  08/16/2014  FINDINGS: The heart is borderline enlarged but stable. There is tortuosity and calcification of the thoracic aorta. Persistent but slight improved right upper lobe infiltrate. Chronic bronchitic type interstitial changes. No pleural effusion.  IMPRESSION: Persistent but slight improved right upper lobe infiltrate.   Electronically Signed   By: Kalman Jewels M.D.   On: 08/23/2014 11:43     Assessment/Plan: kleb pneumonaie +/- aspiration vs. Post obstruction pna = Will change antibiotics to imipenem to cover aspiration pneumonia but will also cover klebsiella bacteremia ( ideally would do amp/sub but patient has unknown pcn allergy).   - currently on day 8 of 14 for aspiration pneumonia/kleb pneumonia/kleb baacteremia - can have picc line placed - repeat blood culture are no growth to date to document clearance  Dr. Megan Salon available for questions over the weekend  [facility], [facility] Cell: [phone] Pager: [phone]  08/23/2014, 2:06 PM

## 2014-08-23 NOTE — Progress Notes (Signed)
ANTIBIOTIC CONSULT NOTE - followup Pharmacy Consult for Primaxin Indication: PNA with possible aspiration vs post obstructive/bacteremia  Allergies  Allergen Reactions  . Chlorhexidine Dermatitis  . Evista [Raloxifene]     unknown  . Penicillins Other (See Comments)    unknown  . Triamterene Other (See Comments)    unknown  . Alendronate Sodium Rash    Patient Measurements: Height: 5' 2.3" (158.2 cm) Weight: 141 lb 5 oz (64.1 kg) IBW/kg (Calculated) : 50.79 Used previous weight of 64kg IBW 59kg for Ht 66in  Vital Signs: Temp: 97.2 F (36.2 C) (12/11 0400) Temp Source: Axillary (12/11 0400) BP: 201/68 mmHg (12/11 0700) Pulse Rate: 102 (12/11 0730) Intake/Output from previous day: 12/10 0701 - 12/11 0700 In: 650 [P.O.:250; IV Piggyback:400] Out: 810 [Urine:810] Intake/Output from this shift:    Labs:  Recent Labs  08/21/14 0338 08/21/14 1400 08/22/14 0352 08/23/14 0330  WBC 21.7*  --  23.2* 25.2*  HGB 9.1*  --  9.3* 9.9*  PLT 198  --  266 330  CREATININE 1.30* 1.17* 1.20* 1.26*   Estimated Creatinine Clearance: 24.7 mL/min (by C-G formula based on Cr of 1.26). No results for input(s): VANCOTROUGH, VANCOPEAK, VANCORANDOM, GENTTROUGH, GENTPEAK, GENTRANDOM, TOBRATROUGH, TOBRAPEAK, TOBRARND, AMIKACINPEAK, AMIKACINTROU, AMIKACIN in the last 72 hours.   Microbiology: Recent Results (from the past 720 hour(s))  Urine culture     Status: None   Collection Time: 08/03/14 12:10 AM  Result Value Ref Range Status   Specimen Description URINE, RANDOM  Final   Special Requests NONE  Final   Culture  Setup Time   Final    08/03/2014 14:56 Performed at [facility]   Final    50,000 COLONIES/ML Performed at [facility]    Culture   Final    KLEBSIELLA PNEUMONIAE Performed at [facility]    Report Status 08/06/2014 FINAL  Final   Organism ID, Bacteria KLEBSIELLA PNEUMONIAE  Final      Susceptibility   Klebsiella  pneumoniae - MIC*    AMPICILLIN >=32 RESISTANT Resistant     CEFAZOLIN <=4 SENSITIVE Sensitive     CEFTRIAXONE <=1 SENSITIVE Sensitive     CIPROFLOXACIN <=0.25 SENSITIVE Sensitive     GENTAMICIN <=1 SENSITIVE Sensitive     LEVOFLOXACIN <=0.12 SENSITIVE Sensitive     NITROFURANTOIN 64 INTERMEDIATE Intermediate     TOBRAMYCIN <=1 SENSITIVE Sensitive     TRIMETH/SULFA <=20 SENSITIVE Sensitive     PIP/TAZO <=4 SENSITIVE Sensitive     * KLEBSIELLA PNEUMONIAE  Blood culture (routine x 2)     Status: None   Collection Time: 08/16/14 10:20 AM  Result Value Ref Range Status   Specimen Description BLOOD RIGHT ANTECUBITAL  Final   Special Requests BOTTLES DRAWN AEROBIC AND ANAEROBIC 5ML  Final   Culture  Setup Time   Final    08/16/2014 12:39 Performed at [facility]    Culture   Final    KLEBSIELLA PNEUMONIAE Note: Gram Stain Report Called to,Read Back By and Verified With: TEVET WOODS RN [id number] 08/16/14 BY EDMOJ Performed at [facility]    Report Status 08/18/2014 FINAL  Final   Organism ID, Bacteria KLEBSIELLA PNEUMONIAE  Final      Susceptibility   Klebsiella pneumoniae - MIC*    AMPICILLIN >=32 RESISTANT Resistant     AMPICILLIN/SULBACTAM 4 SENSITIVE Sensitive     CEFAZOLIN <=4 SENSITIVE Sensitive     CEFEPIME <=1 SENSITIVE Sensitive  CEFTAZIDIME <=1 SENSITIVE Sensitive     CEFTRIAXONE <=1 SENSITIVE Sensitive     CIPROFLOXACIN <=0.25 SENSITIVE Sensitive     GENTAMICIN <=1 SENSITIVE Sensitive     IMIPENEM <=0.25 SENSITIVE Sensitive     PIP/TAZO <=4 SENSITIVE Sensitive     TOBRAMYCIN <=1 SENSITIVE Sensitive     TRIMETH/SULFA <=20 SENSITIVE Sensitive     * KLEBSIELLA PNEUMONIAE  Blood culture (routine x 2)     Status: None   Collection Time: 08/16/14 10:25 AM  Result Value Ref Range Status   Specimen Description BLOOD LEFT ANTECUBITAL  Final   Special Requests BOTTLES DRAWN AEROBIC AND ANAEROBIC 3ML  Final   Culture  Setup Time   Final    08/16/2014  12:39 Performed at [facility]    Culture   Final    KLEBSIELLA PNEUMONIAE Note: SUSCEPTIBILITIES PERFORMED ON PREVIOUS CULTURE WITHIN THE LAST 5 DAYS. Note: Gram Stain Report Called to,Read Back By and Verified With: TEVET WOODS RN [id number] Performed at [facility]    Report Status 08/18/2014 FINAL  Final  MRSA PCR Screening     Status: None   Collection Time: 08/16/14  2:10 PM  Result Value Ref Range Status   MRSA by PCR NEGATIVE NEGATIVE Final    Comment:        The GeneXpert MRSA Assay (FDA approved for NASAL specimens only), is one component of a comprehensive MRSA colonization surveillance program. It is not intended to diagnose MRSA infection nor to guide or monitor treatment for MRSA infections.   Culture, Urine     Status: None   Collection Time: 08/17/14 12:28 PM  Result Value Ref Range Status   Specimen Description URINE, RANDOM  Final   Special Requests NONE  Final   Culture  Setup Time   Final    08/17/2014 20:01 Performed at [place] Performed at [facility]   Final   Culture NO GROWTH Performed at [facility]   Final   Report Status 08/18/2014 FINAL  Final    Medical History: Past Medical History  Diagnosis Date  . Hypertension   . Atrial fibrillation   . Psoriasis   . Congestive heart failure (CHF)   . History of CVA (cerebrovascular accident)   . Coronary artery disease   . Myocardial infarction   . CHF (congestive heart failure)   . Shortness of breath   . H/O hiatal hernia   . Closed fracture of head of left humerus 5/12  . Basal cell carcinoma   . 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  Flow cytometry 04/04/12: 91% cells CD4 positive CD 26 negative  . Hyperlipemia   . Pneumonia   . Wound of right leg 09/25/2012    Necrotic,  open wound right lower leg; non healing  . Stroke   . Hyposmolality and/or hyponatremia   . Lower limb amputation, above knee 10/11/2012  . Pain in joint, multiple sites   . Mycosis fungoides, unspecified site, extranodal and solid organ sites 09/28/2012  . Occlusion and stenosis of carotid artery without mention of cerebral infarction 09/28/2012  . Chronic venous hypertension with ulcer and inflammation 09/28/2012  . Diverticulosis of colon (without mention of hemorrhage) 09/28/2012  . Synovial cyst of popliteal space 03/26/2012  . Unspecified constipation 03/24/2012  .  Hypopotassemia 01/26/2012  . Debility, unspecified 01/17/2012  . Anxiety state, unspecified 12/31/2011  . Anemia, unspecified 12/21/2011  . Other specified disease of white blood cells 12/21/2011  . Peripheral vascular disease, unspecified 12/21/2011    recent ABI Lt of 0.64 down from ).80 on 12/29/11- though different labs  . Reflux esophagitis 12/21/2011  . Unspecified hereditary and idiopathic peripheral neuropathy 12/14/2011  . Pain in joint, ankle and foot 12/14/2011  . Carotid stenosis     CAROTID DOPPLER, 05/02/2012 - LEFT VERTEBRAL-occluded, LEFT BULB AND PROXIMAL ICA STENT-moderate amount of irregular mixed dense plaque 50-69% diameter reduction  . Pre-syncope     NUCLEAR STRESS TEST, 12/10/2009 - normal, EKG negative for ischemia  . TIA (transient ischemic attack)     2D ECHO, 12/05/2012 - EF 60-65%, Severely calcified annulus of the mitral valve with mild-moderate regurgitation, LA moderate-severely dilated  . Paroxysmal atrial fibrillation   . CAD in native artery     3 vessel CAD, medical therapy, not a candidate for CABG  . S/P AKA (above knee amputation) unilateral, Rt leg due to PAD and non healing wound 06/27/2013  . HCAP (healthcare-associated pneumonia) 01/08/2012  . 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 albuterol  2.5 mg Nebulization Q6H  . antiseptic oral rinse  7 mL Mouth Rinse BID  . carvedilol  12.5 mg Oral BID WC  . diltiazem  120 mg Oral Q breakfast  . enoxaparin (LOVENOX) injection  1 mg/kg Subcutaneous Q24H  . famotidine  20 mg Oral Daily  . feeding supplement (ENSURE)  1 Container Oral BID BM  . folic acid  1 mg Oral Q breakfast  . imipenem-cilastatin  250 mg Intravenous Q8H  . isosorbide mononitrate  30 mg Oral Q breakfast  . omega-3 acid ethyl esters  1 g Oral Daily  . polyethylene glycol  17 g Oral QODAY  . polyvinyl alcohol  1 drop Both Eyes BID  . senna-docusate  1 tablet Oral BID  . sodium chloride  3 mL Intravenous Q12H  . triamcinolone cream   Topical UD   Infusions:    Assessment: 80 yo female from SNF presents to ER with fever, hemoptysis, and hypotension. Patient with hx chronic diastolic CHF, s/p right AKA, peripheral T cell lymphoma, PAF, HTN, GERD. To start cefepime and vancomycin per pharmacy for rule out sepsis. Note unknown allergy to PCN but patient has tolerated cefepime in past per records  12/4 >> vanc >> 12/7 12/4 >> cefepime >> 12/8 12/8 >> Primaxin >>  Tmax: afebrile WBCs: 25.2, up Renal: Scr 1.26, up a little, est CrCl 31 ml/min normalized  12/4 blood x2: klebsiella - resistant only to ampicillin 12/4 urine: NGF 12/4 sputum: not collected 12/4 flu: negative 12/4 strep urinary antigen: negative  Previous: 08/03/14 urine: klebsiella - resistant only to amp and intermediate to nitro  Today, 12/11: Day #4 Primaxin 250mg  IV q8 per current renal function and weight  Goal of Therapy:  Treatment of infections, adjustment per renal function  Plan:  Continue Primaxin 250mg  IV q8 for CrCl N 21-40 ml/min and wt of 66kg   Adrian Saran, PharmD, BCPS Pager [phone] 08/23/2014 8:35 AM

## 2014-08-23 NOTE — Progress Notes (Signed)
Sara [name]   DOB:11/24/1918   WL#:[id number]    I have seen the patient, examined her and edited the notes as follows  Subjective: Patient seen and examined. She is on BiPAP at this time, not able to talk to patient, but she appears comfortable. Per nursing report, no new events overnight.    Scheduled Meds: . albuterol  2.5 mg Nebulization Q6H  . antiseptic oral rinse  7 mL Mouth Rinse BID  . carvedilol  12.5 mg Oral BID WC  . diltiazem  120 mg Oral Q breakfast  . enoxaparin (LOVENOX) injection  1 mg/kg Subcutaneous Q24H  . famotidine  20 mg Oral Daily  . feeding supplement (ENSURE)  1 Container Oral BID BM  . folic acid  1 mg Oral Q breakfast  . imipenem-cilastatin  250 mg Intravenous Q8H  . isosorbide mononitrate  30 mg Oral Q breakfast  . omega-3 acid ethyl esters  1 g Oral Daily  . polyethylene glycol  17 g Oral QODAY  . polyvinyl alcohol  1 drop Both Eyes BID  . senna-docusate  1 tablet Oral BID  . sodium chloride  3 mL Intravenous Q12H  . triamcinolone cream   Topical UD   Continuous Infusions:  PRN Meds:.acetaminophen, ondansetron **OR** ondansetron (ZOFRAN) IV, RESOURCE THICKENUP CLEAR Objective:  Filed Vitals:   08/23/14 0627  BP: 198/75  Pulse: 96  Temp:   Resp: 30     Intake/Output Summary (Last 24 hours) at 08/23/14 0701 Last data filed at 08/23/14 0600  Gross per 24 hour  Intake    650 ml  Output    810 ml  Net   -160 ml    GENERAL:alert, ill appearing, Unable to engage in conversation, she is on BIPAP SKIN: Has significant skin erythema consistent with a peripheral T-cell lymphoma, improved compared to prior exam EYES: normal, Conjunctiva are pink injected, sclera clear LUNGS: Bibasilar crackles noted, without wheezing or rhonchi, with increased breathing effort. HEART: Irregular heart rate is noted. NEURO:patient is somnolent this morning, unable to follow commands at this time   Labs:  Lab Results  Component Value Date   WBC 25.2* 08/23/2014    HGB 9.9* 08/23/2014   HCT 32.4* 08/23/2014   MCV 97.9 08/23/2014   PLT 330 08/23/2014   NEUTROABS 10.5* 08/16/2014    Lab Results  Component Value Date   NA 151* 08/23/2014   K 4.8 08/23/2014   CL 114* 08/23/2014   CO2 26 08/23/2014    Studies:  Ct Chest Wo Contrast  08/22/2014   CLINICAL DATA:  80 year old female with history of right lower lobe pneumonia. Shortness breath and weakness. History of lymphoma.  EXAM: CT CHEST WITHOUT CONTRAST  TECHNIQUE: Multidetector CT imaging of the chest was performed following the standard protocol without IV contrast.  COMPARISON:  Chest CT 10/18/2012.  FINDINGS: Mediastinum: Heart size is mildly enlarged. There is no significant pericardial fluid, thickening or pericardial calcification. Severe mitral annular calcifications, likely related to remote rheumatic fever. Calcifications of the aortic valve. There is atherosclerosis of the thoracic aorta, the great vessels of the mediastinum and the coronary arteries, including calcified atherosclerotic plaque in the left main, left anterior descending, left circumflex and right coronary arteries. Multiple borderline enlarged and mildly enlarged right hilar and mediastinal lymph nodes measuring up to 11 mm in short axis in the low right paratracheal station. The full extent of right hilar adenopathy is poorly evaluated on today's non contrast CT examination. Esophagus is unremarkable in appearance.  Lungs/Pleura: Extensive airspace consolidation is noted in the right upper lobe where there are multiple air bronchograms, compatible with pneumonia. There is a large right-sided pleural effusion which layers dependently and is simple in appearance. This is associated with extensive passive atelectasis in the right lower lobe. Trace left pleural effusion. There is multifocal opacification of the bronchi in the lungs bilaterally, most notable in the left lower lobe, which likely represents retained secretions. 5 mm nodule  in the lateral segment of the right middle lobe (image 34 of series 5), unchanged compared to prior study 10/18/2012. Dynamic compression and enlargement of the trachea and mainstem bronchi with respiration, indicative of tracheobronchomalacia.  Upper Abdomen: Extensive atherosclerosis. Atrophy of the left kidney. Right kidney is not visualize.  Musculoskeletal: There are no aggressive appearing lytic or blastic lesions noted in the visualized portions of the skeleton. Chronic compression fracture of T12 with post vertebroplasty changes and approximately 60% loss of anterior vertebral body height is unchanged.  IMPRESSION: 1. Lobar consolidation of much of the right upper lobe compatible with acute pneumonia. There is a large parapneumonic right-sided pleural effusion associated with extensive passive atelectasis in the right lower lobe. 2. Evidence of tracheobronchomalacia. 3. Borderline enlarged and mildly enlarged right hilar and mediastinal lymph nodes are presumably reactive. 4. 5 mm pulmonary nodule in the lateral segment of the right middle lobe, unchanged compared to prior study 10/18/2012, statistically benign. 5. Atherosclerosis, including left main and 3 vessel coronary artery disease. 6. There are calcifications of the aortic valve and mitral annulus. Echocardiographic correlation for evaluation of potential valvular dysfunction may be warranted if clinically indicated.   Electronically Signed   By: Vinnie Langton M.D.   On: 08/22/2014 14:01   Dg Chest Port 1 View  08/21/2014   CLINICAL DATA:  Follow-up right upper lobe pneumonia.  EXAM: PORTABLE CHEST - 1 VIEW  COMPARISON:  Portable chest x-rays 08/19/2014 dating back to 08/16/2014, 10/05/2012. Two-view chest x-ray 08/02/2014, 07/22/2012. CTA chest 10/18/2012.  FINDINGS: Cardiac silhouette mildly to moderately enlarged. Pulmonary vascularity normal without evidence of pulmonary edema. Dense consolidation in the right upper lobe with progressive  volume loss since the examination 5 days ago, new since 08/02/2014. Because of the volume loss, the right hilar and perihilar region is now better visible and there is a masslike opacity in this region. Bilateral pleural effusions and consolidation in the lower lobes, left greater than right, unchanged on the left and increasing on the right since the examination 2 days ago.  IMPRESSION: 1. Stable dense right upper lobe pneumonia associated with progressive volume loss over the past 5 days. A possible right hilar or perihilar mass is now all better visible due to the progressive right upper lobe volume loss. CT of the chest with contrast and possibly bronchoscopy may be helpful in further evaluation. 2. Stable small bilateral pleural effusions. Stable passive atelectasis versus pneumonia in the left lower lobe. Increasing patchy pneumonia in the right lower lobe.   Electronically Signed   By: Evangeline Dakin M.D.   On: 08/21/2014 10:16    Assessment & Plan:   Peripheral T-cell lymphoma  Tagretin was placed on hold on admission Overall, the skin is improving  Respiratory failure secondary to sepsis She is currently on noninvasive assistance with BiPAP under primary team directives, along with antibiotics and nebulizers,   The patient had advanced directives for DNR  Sepsis from urinary tract infection She is currently on broad-spectrum intravenous antibiotics.  Anemia in neoplastic disease No need  to consider transfusion unless <8 g  Leukocytosis Due to sepsis with Klebsiella pneumoniae and bacteremia, respiratory failure, malignancy She is afebrile Continue supportive care with antibiotics, monitor closely.  DVT prophylaxis On Lovenox  Discharge planning The family member inquired about her prognosis. The diagnosis of peripheral T lymphoma should NOT impact her longevity.  She is currently on maximal supportive care  Other medical issues as per primary team  [facility],  PA-C 08/23/2014  7:01 AM Kacia Halley, MD 08/23/2014

## 2014-08-23 NOTE — Progress Notes (Signed)
Sara [name] for Enoxaparin Indication: atrial fibrillation  Allergies  Allergen Reactions  . Chlorhexidine Dermatitis  . Evista [Raloxifene]     unknown  . Penicillins Other (See Comments)    unknown  . Triamterene Other (See Comments)    unknown  . Alendronate Sodium Rash    Patient Measurements: Height: 5' 2.3" (158.2 cm) Weight: 141 lb 5 oz (64.1 kg) IBW/kg (Calculated) : 50.79 Heparin Dosing Weight:   Vital Signs: Temp: 97.2 F (36.2 C) (12/11 0400) Temp Source: Axillary (12/11 0400) BP: 201/68 mmHg (12/11 0700) Pulse Rate: 102 (12/11 0730)  Labs:  Recent Labs  08/21/14 0338 08/21/14 1400 08/22/14 0352 08/23/14 0330  HGB 9.1*  --  9.3* 9.9*  HCT 29.3*  --  29.9* 32.4*  PLT 198  --  266 330  CREATININE 1.30* 1.17* 1.20* 1.26*    Estimated Creatinine Clearance: 24.7 mL/min (by C-G formula based on Cr of 1.26).   Medical History: Past Medical History  Diagnosis Date  . Hypertension   . Atrial fibrillation   . Psoriasis   . Congestive heart failure (CHF)   . History of CVA (cerebrovascular accident)   . Coronary artery disease   . Myocardial infarction   . CHF (congestive heart failure)   . Shortness of breath   . H/O hiatal hernia   . Closed fracture of head of left humerus 5/12  . Basal cell carcinoma   . 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  Flow cytometry 04/04/12: 91% cells CD4 positive CD 26 negative  . Hyperlipemia   . Pneumonia   . Wound of right leg 09/25/2012    Necrotic, open wound right lower leg; non healing  . Stroke   . Hyposmolality and/or hyponatremia   . Lower limb amputation, above knee 10/11/2012  . Pain in joint, multiple sites   . Mycosis fungoides, unspecified site, extranodal and solid organ sites 09/28/2012  . Occlusion and stenosis of carotid  artery without mention of cerebral infarction 09/28/2012  . Chronic venous hypertension with ulcer and inflammation 09/28/2012  . Diverticulosis of colon (without mention of hemorrhage) 09/28/2012  . Synovial cyst of popliteal space 03/26/2012  . Unspecified constipation 03/24/2012  . Hypopotassemia 01/26/2012  . Debility, unspecified 01/17/2012  . Anxiety state, unspecified 12/31/2011  . Anemia, unspecified 12/21/2011  . Other specified disease of white blood cells 12/21/2011  . Peripheral vascular disease, unspecified 12/21/2011    recent ABI Lt of 0.64 down from ).80 on 12/29/11- though different labs  . Reflux esophagitis 12/21/2011  . Unspecified hereditary and idiopathic peripheral neuropathy 12/14/2011  . Pain in joint, ankle and foot 12/14/2011  . Carotid stenosis     CAROTID DOPPLER, 05/02/2012 - LEFT VERTEBRAL-occluded, LEFT BULB AND PROXIMAL ICA STENT-moderate amount of irregular mixed dense plaque 50-69% diameter reduction  . Pre-syncope     NUCLEAR STRESS TEST, 12/10/2009 - normal, EKG negative for ischemia  . TIA (transient ischemic attack)     2D ECHO, 12/05/2012 - EF 60-65%, Severely calcified annulus of the mitral valve with mild-moderate regurgitation, LA moderate-severely dilated  . Paroxysmal atrial fibrillation   . CAD in native artery     3 vessel CAD, medical therapy, not a candidate for CABG  . S/P AKA (above knee amputation) unilateral, Rt leg due to PAD and non healing wound 06/27/2013  .  HCAP (healthcare-associated pneumonia) 01/08/2012  . 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  She is also on chronic dabigatran therapy for paroxysmal atrial fibrillation.  She was noted to have ARF on admission with CrCl ~20ml/min and also on diltiazem (a moderate dual CYP-3A4 /p-gyp inhibitor), so  probably best to avoid Dabigatran until SCr improves.  Discussed with Dr. Charlies Silvers on admission and again with Dr. Doyle Askew today (08/19/14)--will use enoxaparin in meantime; although enoxaparin is also renally eliminated, we will at least be able to check anti-Xa levels if needed.    Last dose of dabigatran 12/3 at 8pm  CBC stable  Scr continues to be in 1.2 to 1.3 range with est CrCl 16ml/min  No reported bleeding  Goal of Therapy:  Anti-Xa level 0.6-1 units/ml 4hrs after LMWH dose given Monitor platelets by anticoagulation protocol: Yes   Plan:  1) Continue Lovenox 65mg  (1 mg/kg) SQ q24 for CrCl < 30 ml/min 2) SCr does not appear to be improving to the point that CrCl will be > 30 ml/min and does not seem likely that Pradaxa will be able to restarted - What is plan for further anticoagulation?   Adrian Saran, PharmD, BCPS Pager [phone] 08/23/2014 8:46 AM

## 2014-08-23 NOTE — Procedures (Signed)
Thoracentesis Procedure Note  Pre-operative Diagnosis: RT pleural effusion  Post-operative Diagnosis: same  Indications: resp distress, pleural effusion  Procedure Details  Consent: Informed consent was obtained. Risks of the procedure were discussed including: infection, bleeding, pain, pneumothorax.  Under sterile conditions the patient was positioned. Betadine solution and sterile drapes were utilized.  1% buffered lidocaine was used to anesthetize the right 7th rib space. Fluid was obtained without any difficulties and minimal blood loss.  A dressing was applied to the wound and wound care instructions were provided.   Findings 1200  ml of clear pleural fluid was obtained. A sample was sent to Pathology for cytology, chemistry and cell counts, as well as for infection analysis.  Complications:  None; patient tolerated the procedure well.          Condition: stable  Plan A follow up chest x-ray was ordered. Bed Rest for 2 hours. Tylenol 650 mg. for pain.  Attending Attestation: I was present and scrubbed for the entire procedure.  Rigoberto Noel MD

## 2014-08-23 NOTE — Progress Notes (Signed)
Patient ID: [name], female   DOB: [birth year], 80 y.o.   MRN: [id number]  [facility] PROGRESS NOTE  TAYLOUR LIETZKE WUX:[id number] DOB: 12-Mar-1919 DOA: 08/16/2014 PCP: Estill Dooms, MD   Brief narrative: 80 year old female with past medical history of PAD status post stenting (LICA 25/3664), right AKA, paroxysmal atrial fibrillation on AC with pradaxa,dementia, hypertension, chronic diastolic CHF (2 D ECHO in 08/2014 with EF of 55%, wheelchair bound who presented from [place] to [facility] ED with reports of not feeling very well and reports of fevers. She is not a very good historian due to dementia. Apparently no respiratory distress but on admission patient was found to have O2 saturation of 86%. No other complaints.  In ED, BP was 71/42 to 141/49, HR 59-91, RR 25 and T max 102 F. Oxygen saturation was 86% on room air, improved to 92% with [place]er oxygen support. CXR consistent with right upper lung lobe pneumonia. She was started on broad spectrum abx, cefepime and vanco for sepsis and pneumonia. She is DNR and stable with current BP of 141/49 to go to telemetry floor.   Assessment and Plan:   Principal Problem: Severe sepsis / Leukocytosis / HCAP  - sepsis criteria met with initial vitals hypotension, tachypnea, bradycardia, T max 102 F, leukocytosis, evidence of end organ damange which includes hypoxia and ARF, evidence of PNA on CXR. - blood cultures positive for Klebsiella (source possible from recent UTI, urine culture from 11/26 positive for Klebsiella)  - started broad spectrum antibiotics Vancomycin and Maxipime. Vanc d/c per ID 12/07, maxipime d/c 12/08 - ABX changed to Primaxin to cover for aspiration PNA 12/08 - Ct Chest with large parapneumonic right-sided pleural effusion a/w extensive passive atelectasis RLL - plan for thoracentesis today by PCCM     Active Problems: Chronic diastolic HF (heart failure) - last BNP in 12/2013 with BNP in 300-400 range. 2 D  ECHO in 08/2013 showed EF 55%. - weight from admission: 136 lbs --> 151 lbs --> 143 lbs --> 144 --> 141 lbs this AM - stopped IVF 12/06 - pt started on Lasix 20 mg IV BID 12/6, holding Lasix since 12/08 - continue to monitor daily weights, strict I's and O's Hypernatremia - holding IVF to avoid volume overload, Na still 151 - repeat BMP in AM HTN (hypertension) - resumed Coreg 12. 5 mg PO BID and Cardizem 12/06, may need to hold due to mild bradycardia  - resumed Imdur 12/07, pt unable to take PO today 12/11 as she is requiring BiPAP - continue to hold Lisinopril and Lasix  - provide Hydralazine as needed Leukocytosis - pt with known Lymphoma so wondering if this is contributing  - appreciate Dr. Alvy Bimler assistance  PAF (paroxysmal atrial fibrillation) / Chronic anticoagulation- Pradaxa - on pradaxa but on hold due to worsening renal failure, low GFR S/P R AKA due to PAD and non healing wound / PAD - LICA stent 4/03 - stable - continue gabapentin  Stroke, acute, Rt brain, embolic March 4742 - pt is on pradaxa but still on hold due to worsening renal failure. 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 - hold targretin in the setting of an acute infection, sepsis ARF (acute renal failure)  - continue to hold lisinopril and lasix  - Cr remains stable ~ 1.2  -  repeat BMP in AM Anemia of chronic disease - secondary to peripheral T cell lymphoma and chemotherapy  - hemoglobin is 11 on admission and slightly down since admission - no signs of active bleeding  - no current indications for transfusion  GERD (gastroesophageal reflux disease) - continue Pepcid 40 mg daily  Hypokalemia - supplemented and WNL this AM Severe PCM - in the context of acute illness - advance diet as pt able to tolerate  -  aspiration precautions  Functional quadriplegia - bed bound since admission - attempt to get OOB to chair   DVT prophylaxis:  - enoxaparin subQ since admission   Code Status: Partial, no intubation  Family Communication: family at bedside  Disposition Plan: Remains inpatient   IV Access:    Peripheral IV Procedures and diagnostic studies:    CXR 08/21/2014 Stable dense right upper lobe pneumonia associated with progressive volume loss over the past 5 days. A possible right hilar or perihilar mass is now all better visible due to the progressive right upper lobe volume loss. CT of the chest with contrast and possibly bronchoscopy may be helpful in further evaluation. Stable small bilateral pleural effusions. Stable passive atelectasis versus pneumonia in the left lower lobe. Increasing patchy pneumonia in the right lower lobe  CXR 08/19/2014 Persistent RUL airspace disease, ? central obstructing lesion. small pleural effusions, atelectasis.   CXR12/01/2014 Persistent dense consolidative right upper lobe pneumonia. Background COPD/ emphysema.   CXR12/12/2013 Increasing right upper lobe pneumonia.   CXR 08/16/2014 Right upper lobe pneumonia. Post treatment radiographs recommended to document resolution.  Medical Consultants:    PCCM  ID Other Consultants:    Physical therapy  Anti-Infectives:    Vancomycin 12/04 --> 12/07  Maxipime 12/04 --> 12/08  Primaxin 12/08 -->  [name], MD  [facility] Pager [phone]  If 7PM-7AM, please contact night-coverage [email or web address] Password [email or web address] 08/23/2014, 10:00 AM   LOS: 7 days   HPI/Subjective: No events overnight.   Objective: Filed Vitals:   08/23/14 0627 08/23/14 0700 08/23/14 0730 08/23/14 0800  BP: 198/75 201/68  194/82  Pulse: 96 114 102 106  Temp:    97.9 F (36.6 C)  TempSrc:    Axillary  Resp: 30 32 28 32  Height:      Weight:      SpO2: 95% 96% 96% 96%     Intake/Output Summary (Last 24 hours) at 08/23/14 1000 Last data filed at 08/23/14 0758  Gross per 24 hour  Intake    300 ml  Output    685 ml  Net   -385 ml    Exam:   General:  Pt is sired appearing, frail, on BIPAP  Cardiovascular: irregular rate and rhythm,  no rubs, no gallops  Respiratory: Very poor sounds on the right, more clear on the left side, pt on BiPAP this AM  Abdomen: Soft, non tender, non distended, bowel sounds present, no guarding   Data Reviewed: Basic Metabolic Panel:  Recent Labs Lab 08/20/14 0331 08/21/14 0338 08/21/14 1400 08/22/14 0352 08/23/14 0330  NA 145 151* 148* 148* 151*  K 3.9 4.1 4.2 4.3 4.8  CL 107 112 111 111 114*  CO2 _0 GLUCOSE 173* 142* 219* 113* 115*  BUN 49* 55* 55* 56* 58*  CREATININE 1.36* 1.30* 1.17* 1.20* 1.26*  CALCIUM 9.6 9.6 9.7 9.5 9.7   Liver Function Tests:  Recent Labs Lab 08/16/14 1020 08/17/14 0350  AST 20 20  ALT 11 10  ALKPHOS 50 35*  BILITOT 0.8 0.6  PROT 6.9 6.0  ALBUMIN 3.0* 2.5*   No results for input(s): LIPASE, AMYLASE in the last 168 hours. No results for input(s): AMMONIA in the last 168 hours. CBC:  Recent Labs Lab 08/16/14 1124  08/19/14 0345 08/20/14 0331 08/21/14 0338 08/22/14 0352 08/23/14 0330  WBC 16.0*  < > 24.1* 18.5* 21.7* 23.2* 25.2*  NEUTROABS 10.5*  --   --   --   --   --   --   HGB 11.0*  < > 10.6* 9.5* 9.1* 9.3* 9.9*  HCT 32.8*  < > 33.6* 30.0* 29.3* 29.9* 32.4*  MCV 92.1  < > 94.6 94.6 96.7 96.8 97.9  PLT 201  < > 181 188 198 266 330  < > = values in this interval not displayed.  CBG:  Recent Labs Lab 08/17/14 0751 08/20/14 0800 08/21/14 0802 08/22/14 0747 08/23/14 0807  GLUCAP 98 137* 131* 128* 140*    Recent Results (from the past 240 hour(s))  Blood culture (routine x 2)     Status: None   Collection Time: 08/16/14 10:20 AM  Result Value Ref Range Status   Specimen Description BLOOD RIGHT ANTECUBITAL  Final   Special Requests  BOTTLES DRAWN AEROBIC AND ANAEROBIC 5ML  Final   Culture  Setup Time   Final    08/16/2014 12:39 Performed at [facility]    Culture   Final    KLEBSIELLA PNEUMONIAE Note: Gram Stain Report Called to,Read Back By and Verified With: TEVET WOODS RN [id number] 08/16/14 BY EDMOJ Performed at [facility]    Report Status 08/18/2014 FINAL  Final   Organism ID, Bacteria KLEBSIELLA PNEUMONIAE  Final      Susceptibility   Klebsiella pneumoniae - MIC*    AMPICILLIN >=32 RESISTANT Resistant     AMPICILLIN/SULBACTAM 4 SENSITIVE Sensitive     CEFAZOLIN <=4 SENSITIVE Sensitive     CEFEPIME <=1 SENSITIVE Sensitive     CEFTAZIDIME <=1 SENSITIVE Sensitive     CEFTRIAXONE <=1 SENSITIVE Sensitive     CIPROFLOXACIN <=0.25 SENSITIVE Sensitive     GENTAMICIN <=1 SENSITIVE Sensitive     IMIPENEM <=0.25 SENSITIVE Sensitive     PIP/TAZO <=4 SENSITIVE Sensitive     TOBRAMYCIN <=1 SENSITIVE Sensitive     TRIMETH/SULFA <=20 SENSITIVE Sensitive     * KLEBSIELLA PNEUMONIAE  Blood culture (routine x 2)     Status: None   Collection Time: 08/16/14 10:25 AM  Result Value Ref Range Status   Specimen Description BLOOD LEFT ANTECUBITAL  Final   Special Requests BOTTLES DRAWN AEROBIC AND ANAEROBIC 3ML  Final   Culture  Setup Time   Final    08/16/2014 12:39 Performed at [facility]    Culture   Final    KLEBSIELLA PNEUMONIAE Note: SUSCEPTIBILITIES PERFORMED ON PREVIOUS CULTURE WITHIN THE LAST 5 DAYS. Note: Gram Stain Report Called to,Read Back By and Verified With: TEVET WOODS RN [id number] Performed at [facility]    Report Status 08/18/2014 FINAL  Final  MRSA PCR Screening     Status: None   Collection Time: 08/16/14  2:10 PM  Result Value Ref Range Status   MRSA by PCR NEGATIVE NEGATIVE Final    Comment:        The GeneXpert MRSA Assay (FDA approved for NASAL specimens only), is one component of a comprehensive MRSA colonization surveillance program. It is not intended to  diagnose MRSA infection nor to guide  or monitor treatment for MRSA infections.   Culture, Urine     Status: None   Collection Time: 08/17/14 12:28 PM  Result Value Ref Range Status   Specimen Description URINE, RANDOM  Final   Special Requests NONE  Final   Culture  Setup Time   Final    08/17/2014 20:01 Performed at [place] Performed at [facility]   Final   Culture NO GROWTH Performed at [facility]   Final   Report Status 08/18/2014 FINAL  Final     Scheduled Meds: . albuterol  2.5 mg Nebulization Q6H  . antiseptic oral rinse  7 mL Mouth Rinse BID  . carvedilol  12.5 mg Oral BID WC  . diltiazem  120 mg Oral Q breakfast  . enoxaparin (LOVENOX) injection  1 mg/kg Subcutaneous Q24H  . famotidine  20 mg Oral Daily  . feeding supplement (ENSURE)  1 Container Oral BID BM  . folic acid  1 mg Oral Q breakfast  . imipenem-cilastatin  250 mg Intravenous Q8H  . isosorbide mononitrate  30 mg Oral Q breakfast  . omega-3 acid ethyl esters  1 g Oral Daily  . polyethylene glycol  17 g Oral QODAY  . polyvinyl alcohol  1 drop Both Eyes BID  . senna-docusate  1 tablet Oral BID  . sodium chloride  3 mL Intravenous Q12H  . triamcinolone cream   Topical UD   Continuous Infusions:

## 2014-08-23 NOTE — Progress Notes (Signed)
PULMONARY / CRITICAL CARE MEDICINE   Name: Sara [name] MRN: [id number] DOB: 01/24/1919    ADMISSION DATE:  08/16/2014 CONSULTATION DATE:  12/8  REFERRING MD :  Doyle Askew   CHIEF COMPLAINT:  Acute respiratory failure HCAP   INITIAL PRESENTATION:  80 year old female who resides at a SNF w/ sig medical h/o CAF, PAD, mild dementia (but cognitive fxn fairly intact), prior AKA on right, and chronic diastolic HF Admitted on 45/8 from the SNF w/ RUL PNA. She was admitted to the SDU, placed on empiric abx and NIPPV.Culture data was positive for Klebsiella PNA. ID in 2/2 blood cultures. Infectious disease was consulted. She had made very slow progress clinically and radiographically. PCCM asked to see on 12/8 as she still was requiring NIPPV   STUDIES:  CT chest 12/10: 1. Lobar consolidation of much of the right upper lobe compatible with acute pneumonia. There is a large parapneumonic right-sided pleural effusion associated with extensive passive atelectasis in the right lower lobe.2. Evidence of tracheobronchomalacia.3. Borderline enlarged and mildly enlarged right hilar and mediastinal lymph nodes are presumably reactive.4. 5 mm pulmonary nodule in the lateral segment of the right middle lobe, unchanged compared to prior study SIGNIFICANT EVENTS:    SUBJECTIVE: weak, back on BIPAP   VITAL SIGNS: Temp:  [97 F (36.1 C)-98.1 F (36.7 C)] 97.9 F (36.6 C) (12/11 0800) Pulse Rate:  [53-114] 56 (12/11 1000) Resp:  [20-32] 29 (12/11 1000) BP: (113-218)/(58-116) 208/116 mmHg (12/11 1000) SpO2:  [94 %-99 %] 98 % (12/11 1000) FiO2 (%):  [35 %] 35 % (12/11 0800) Weight:  [64.1 kg (141 lb 5 oz)] 64.1 kg (141 lb 5 oz) (12/11 0500)  4 liters  HEMODYNAMICS:   VENTILATOR SETTINGS: Vent Mode:  [-]  FiO2 (%):  [35 %] 35 % INTAKE / OUTPUT:  Intake/Output Summary (Last 24 hours) at 08/23/14 1049 Last data filed at 08/23/14 1000  Gross per 24 hour  Intake    303 ml  Output    685 ml  Net   -382 ml     PHYSICAL EXAMINATION: General:  80 year old female, currently on NIPPV Neuro:  Awake, generalized weakness. Follows commands  HEENT: [place], no JVD  Cardiovascular:  rrr Lungs:  Basilar rales, mild accessory muscle use, decreased on right  Abdomen:  Soft, non-tender  Musculoskeletal:  Intact  Skin:  Intact   LABS:  CBC  Recent Labs Lab 08/21/14 0338 08/22/14 0352 08/23/14 0330  WBC 21.7* 23.2* 25.2*  HGB 9.1* 9.3* 9.9*  HCT 29.3* 29.9* 32.4*  PLT 198 266 330   Coag's No results for input(s): APTT, INR in the last 168 hours. BMET  Recent Labs Lab 08/21/14 1400 08/22/14 0352 08/23/14 0330  NA 148* 148* 151*  K 4.2 4.3 4.8  CL 111 111 114*  CO2 24 25 26   BUN 55* 56* 58*  CREATININE 1.17* 1.20* 1.26*  GLUCOSE 219* 113* 115*   Electrolytes  Recent Labs Lab 08/21/14 1400 08/22/14 0352 08/23/14 0330  CALCIUM 9.7 9.5 9.7   Sepsis Markers  Recent Labs Lab 08/16/14 2108  LATICACIDVEN 1.8   ABG  Recent Labs Lab 08/16/14 1807  PHART 7.284*  PCO2ART 54.9*  PO2ART 72.1*   Liver Enzymes  Recent Labs Lab 08/17/14 0350  AST 20  ALT 10  ALKPHOS 35*  BILITOT 0.6  ALBUMIN 2.5*   Cardiac Enzymes No results for input(s): TROPONINI, PROBNP in the last 168 hours. Glucose  Recent Labs Lab 08/17/14 0751 08/20/14  0800 08/21/14 0802 08/22/14 0747 08/23/14 0807  GLUCAP 98 137* 131* 128* 140*    Imaging Ct Chest Wo Contrast  08/22/2014   CLINICAL DATA:  80 year old female with history of right lower lobe pneumonia. Shortness breath and weakness. History of lymphoma.  EXAM: CT CHEST WITHOUT CONTRAST  TECHNIQUE: Multidetector CT imaging of the chest was performed following the standard protocol without IV contrast.  COMPARISON:  Chest CT 10/18/2012.  FINDINGS: Mediastinum: Heart size is mildly enlarged. There is no significant pericardial fluid, thickening or pericardial calcification. Severe mitral annular calcifications, likely related to remote  rheumatic fever. Calcifications of the aortic valve. There is atherosclerosis of the thoracic aorta, the great vessels of the mediastinum and the coronary arteries, including calcified atherosclerotic plaque in the left main, left anterior descending, left circumflex and right coronary arteries. Multiple borderline enlarged and mildly enlarged right hilar and mediastinal lymph nodes measuring up to 11 mm in short axis in the low right paratracheal station. The full extent of right hilar adenopathy is poorly evaluated on today's non contrast CT examination. Esophagus is unremarkable in appearance.  Lungs/Pleura: Extensive airspace consolidation is noted in the right upper lobe where there are multiple air bronchograms, compatible with pneumonia. There is a large right-sided pleural effusion which layers dependently and is simple in appearance. This is associated with extensive passive atelectasis in the right lower lobe. Trace left pleural effusion. There is multifocal opacification of the bronchi in the lungs bilaterally, most notable in the left lower lobe, which likely represents retained secretions. 5 mm nodule in the lateral segment of the right middle lobe (image 34 of series 5), unchanged compared to prior study 10/18/2012. Dynamic compression and enlargement of the trachea and mainstem bronchi with respiration, indicative of tracheobronchomalacia.  Upper Abdomen: Extensive atherosclerosis. Atrophy of the left kidney. Right kidney is not visualize.  Musculoskeletal: There are no aggressive appearing lytic or blastic lesions noted in the visualized portions of the skeleton. Chronic compression fracture of T12 with post vertebroplasty changes and approximately 60% loss of anterior vertebral body height is unchanged.  IMPRESSION: 1. Lobar consolidation of much of the right upper lobe compatible with acute pneumonia. There is a large parapneumonic right-sided pleural effusion associated with extensive passive  atelectasis in the right lower lobe. 2. Evidence of tracheobronchomalacia. 3. Borderline enlarged and mildly enlarged right hilar and mediastinal lymph nodes are presumably reactive. 4. 5 mm pulmonary nodule in the lateral segment of the right middle lobe, unchanged compared to prior study 10/18/2012, statistically benign. 5. Atherosclerosis, including left main and 3 vessel coronary artery disease. 6. There are calcifications of the aortic valve and mitral annulus. Echocardiographic correlation for evaluation of potential valvular dysfunction may be warranted if clinically indicated.   Electronically Signed   By: Vinnie Langton M.D.   On: 08/22/2014 14:01     ASSESSMENT / PLAN:  PULMONARY OETT A: acute respiratory failure in the setting of HCAP vs aspiration -slow progress     Large right pleural effusion  P:   Cont NIPPV PRN mobilize Scheduled BDs Diagnostic/therapeutic thoracentesis, will send full evaluation   CARDIOVASCULAR CVL A:  Sepsis in the setting of bacteremia  Chronic diastolic HF HTN PAF -brady on coreg & cardizem  P:  Cont tele monitoring  Keep euvolemic  Hold parameters on BB, CCB -resume labetalol if cannot take PO while on bipap  RENAL A:   AKI: scr had improved after holding lasix  Hypernatremia  P:   Avoid hypotension in setting  of active diuresis  Renal dose meds F/u chemistry Hold further lasix for now Will add D5W as oral intake poor   GASTROINTESTINAL A:   Possible dysphagia P:   Aspiration precautions  PPI   HEMATOLOGIC A:   H/o T cell lymphoma of multiple LNs w/ multiple sites  Anemia of chronic illness  Persistent leucocytosis P:  Trend cbc [place] LMWH  INFECTIOUS A:   Sepsis w/ klebsiella bacteremia, likely from UTI 11/21.  Aspiration vs HCAP  P:   BCx2  12/6: Klebsiella pneumoniae- R to ampi UC 12/5: neg, 11/21 klebsiella  maxipime 12/4>>>12/9 Imipenem 12/9 >> Defer to ID   ENDOCRINE A:   No acute  P:   Trend glucose    NEUROLOGIC A:  Acute encephalopathy  P:   RASS goal: -0 D/c'd sedating meds Supportive care    NP Summary  Still only slowly improving. Has sig pleural effusion. Will go ahead w/ diagnostic and therapeutic thoracentesis   BABCOCK,PETE  Attending MD summary - Slow improvement here, C chest does not show obstructive component to pneumonia -large effusion was tapped 1.2 L Loletha Grayer has resolved & she is now hypertensive & tachy - will use labetalol while on bippa, then resume PO coreg & dilzem  The patient is critically ill with multiple organ systems failure and requires high complexity decision making for assessment and support, frequent evaluation and titration of therapies, application of advanced monitoring technologies and extensive interpretation of multiple databases. Critical Care Time devoted to patient care services described in this note independent of APP time is 31 minutes.   Rigoberto Noel MD

## 2014-08-23 NOTE — Progress Notes (Signed)
Pt currently off BIPAP and resting comfortably on 3 LPM [place] at this time.  Pt in no noted distress at this time, RT to monitor and assess as needed.

## 2014-08-23 NOTE — Progress Notes (Signed)
CSW continuing to follow.  Pt admitted from [facility] where pt is a long term resident.   Pt has continued to require Bipap and disposition will be dependent on pt progression.  CSW to continue to follow and keep [place] updated on pt progress.  CSW to continue to follow.  [name], MSW, [facility] [phone]

## 2014-08-24 LAB — CBC
HCT: 29.7 % — ABNORMAL LOW (ref 36.0–46.0)
HEMOGLOBIN: 9.1 g/dL — AB (ref 12.0–15.0)
MCH: 29.7 pg (ref 26.0–34.0)
MCHC: 30.6 g/dL (ref 30.0–36.0)
MCV: 97.1 fL (ref 78.0–100.0)
Platelets: 328 10*3/uL (ref 150–400)
RBC: 3.06 MIL/uL — ABNORMAL LOW (ref 3.87–5.11)
RDW: 14.2 % (ref 11.5–15.5)
WBC: 26.5 10*3/uL — AB (ref 4.0–10.5)

## 2014-08-24 LAB — BASIC METABOLIC PANEL
Anion gap: 11 (ref 5–15)
BUN: 57 mg/dL — ABNORMAL HIGH (ref 6–23)
CALCIUM: 8.7 mg/dL (ref 8.4–10.5)
CHLORIDE: 113 meq/L — AB (ref 96–112)
CO2: 24 meq/L (ref 19–32)
Creatinine, Ser: 1.15 mg/dL — ABNORMAL HIGH (ref 0.50–1.10)
GFR calc Af Amer: 46 mL/min — ABNORMAL LOW (ref >90)
GFR calc non Af Amer: 40 mL/min — ABNORMAL LOW (ref >90)
Glucose, Bld: 127 mg/dL — ABNORMAL HIGH (ref 70–99)
Potassium: 4.5 mEq/L (ref 3.7–5.3)
SODIUM: 148 meq/L — AB (ref 137–147)

## 2014-08-24 LAB — GLUCOSE, CAPILLARY: Glucose-Capillary: 128 mg/dL — ABNORMAL HIGH (ref 70–99)

## 2014-08-24 MED ORDER — LEVALBUTEROL HCL 1.25 MG/0.5ML IN NEBU: 1.2500 mg | INHALATION_SOLUTION | Freq: Four times a day (QID) | RESPIRATORY_TRACT | Status: DC | PRN

## 2014-08-24 NOTE — Progress Notes (Signed)
Patient ID: Sara [name], female   DOB: 04/06/1919, 80 y.o.   MRN: [id number]  [facility] PROGRESS NOTE  Sara [name] YBO:[id number] DOB: 10-26-1918 DOA: 08/16/2014 PCP: Estill Dooms, MD   Brief narrative: 80 year old female with past medical history of PAD status post stenting (LICA 27/7824), right AKA, paroxysmal atrial fibrillation on AC with pradaxa,dementia, hypertension, chronic diastolic CHF (2 D ECHO in 08/2014 with EF of 55%, wheelchair bound who presented from [place] to [facility] ED with reports of not feeling very well and reports of fevers. She is not a very good historian due to dementia. Apparently no respiratory distress but on admission patient was found to have O2 saturation of 86%. No other complaints.  In ED, BP was 71/42 to 141/49, HR 59-91, RR 25 and T max 102 F. Oxygen saturation was 86% on room air, improved to 92% with [place] oxygen support. CXR consistent with right upper lung lobe pneumonia. She was started on broad spectrum abx, cefepime and vanco for sepsis and pneumonia. She is DNR and stable with current BP of 141/49 to go to telemetry floor.   Assessment and Plan:   Principal Problem: Severe sepsis / Leukocytosis / HCAP  - sepsis criteria met with initial vitals hypotension, tachypnea, bradycardia, T max 102 F, leukocytosis, evidence of end organ damange which includes hypoxia and ARF, evidence of PNA on CXR. - blood cultures positive for Klebsiella (source possible from recent UTI, urine culture from 11/26 positive for Klebsiella)  - started broad spectrum antibiotics Vancomycin and Maxipime 12/4. Vanc d/c per ID 12/07, maxipime d/c 12/08 - ABX changed to Primaxin to cover for aspiration PNA 12/08 - Ct Chest with large parapneumonic right-sided pleural effusion a/w extensive passive atelectasis RLL - s/p right sided thoracentesis by PCCM (12/11), final report pending  - pt with less dyspnea this AM and not requiring BiPAP  Active  Problems: Chronic diastolic HF (heart failure) - last BNP in 12/2013 with BNP in 300-400 range. 2 D ECHO in 08/2013 showed EF 55%. - weight from admission: 136 lbs --> 151 lbs --> 143 lbs --> 144 --> remains stable  - stopped IVF 12/06 - pt started on Lasix 20 mg IV BID 12/6, holding Lasix since 12/08 - continue to monitor daily weights, strict I's and O's Hypernatremia - holding IVF to avoid volume overload, Na still 151 12/11 - BMP pending this AM - repeat BMP in AM HTN (hypertension) - resumed Coreg 12. 5 mg PO BID and Cardizem 12/06, may need to hold due to mild bradycardia  - resumed Imdur 12/07, pt unable to take PO 12/11 as she is requiring BiPAP - continue to hold Lisinopril and Lasix  - provide Labetalol as needed IV until able to take PO  Leukocytosis - pt with known Lymphoma so wondering if this is contributing  - appreciate Dr. Alvy Bimler assistance  - CBC pending this AM PAF (paroxysmal atrial fibrillation) / Chronic anticoagulation- Pradaxa - on pradaxa but on hold due to worsening renal failure, low GFR S/P R AKA due to PAD and non healing wound / PAD - LICA stent 2/35 - stable - continue gabapentin  Stroke, acute, Rt brain, embolic March 3614 - pt is on pradaxa but still on hold due to worsening renal failure. 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 -  hold targretin in the setting of an acute infection, sepsis ARF (acute renal failure)  - continue to hold lisinopril and lasix  - Cr remains stable ~ 1.2  - repeat BMP in AM Anemia of chronic disease - secondary to peripheral T cell lymphoma and chemotherapy  - hemoglobin is 11 on admission and slightly down since admission - no signs of active bleeding  - no current indications for transfusion  GERD (gastroesophageal reflux disease) -  continue Pepcid 40 mg daily  Hypokalemia - supplemented and WNL this AM Severe PCM - in the context of acute illness - advance diet as pt able to tolerate  - aspiration precautions  Functional quadriplegia - bed bound since admission - attempt to get OOB to chair   DVT prophylaxis:  - enoxaparin subQ since admission   Code Status: Partial, no intubation  Family Communication: family at bedside  Disposition Plan: Remains inpatient   IV Access:    Peripheral IV Procedures and diagnostic studies:    CXR 08/21/2014 Stable dense right upper lobe pneumonia associated with progressive volume loss over the past 5 days. A possible right hilar or perihilar mass is now all better visible due to the progressive right upper lobe volume loss. CT of the chest with contrast and possibly bronchoscopy may be helpful in further evaluation. Stable small bilateral pleural effusions. Stable passive atelectasis versus pneumonia in the left lower lobe. Increasing patchy pneumonia in the right lower lobe  CXR 08/19/2014 Persistent RUL airspace disease, ? central obstructing lesion. small pleural effusions, atelectasis.   CXR12/01/2014 Persistent dense consolidative right upper lobe pneumonia. Background COPD/ emphysema.   CXR12/12/2013 Increasing right upper lobe pneumonia.   CXR 08/16/2014 Right upper lobe pneumonia. Post treatment radiographs recommended to document resolution.  Medical Consultants:    PCCM  ID Other Consultants:    Physical therapy  Anti-Infectives:    Vancomycin 12/04 --> 12/07  Maxipime 12/04 --> 12/08  Primaxin 12/08 -->  [name], MD  [facility] Pager [phone]  If 7PM-7AM, please contact night-coverage [email or web address] Password [email or web address] 08/24/2014, 11:28 AM   LOS: 8 days   HPI/Subjective: No events overnight.   Objective: Filed Vitals:   08/24/14 0600 08/24/14 0800 08/24/14 0823 08/24/14 0850  BP:  140/97   199/75  Pulse: 166     Temp:  98.1 F (36.7 C)    TempSrc:  Oral    Resp: 21     Height:      Weight:      SpO2: 96%  97%     Intake/Output Summary (Last 24 hours) at 08/24/14 1128 Last data filed at 08/24/14 0600  Gross per 24 hour  Intake 1223.33 ml  Output    960 ml  Net 263.33 ml    Exam:   General:  Pt is alert, tired, NAD, on [place]   Cardiovascular: Irregular rate and rhythm, no rubs, no gallops  Respiratory: diminished breath sounds at bases, still worse on the right side but improved since yesterday   Abdomen: Soft, non tender, non distended, bowel sounds present, no guarding   Data Reviewed: Basic Metabolic Panel:  Recent Labs Lab 08/20/14 0331 08/21/14 0338 08/21/14 1400 08/22/14 0352 08/23/14 0330  NA 145 151* 148* 148* 151*  K 3.9 4.1 4.2 4.3 4.8  CL 107 112 111 111 114*  CO2 $Re'25 25 24 25 26  'EJz$ GLUCOSE 173* 142* 219* 113* 115*  BUN 49* 55* 55* 56* 58*  CREATININE 1.36* 1.30* 1.17* 1.20* 1.26*  CALCIUM 9.6 9.6  9.7 9.5 9.7   Liver Function Tests:  Recent Labs Lab 08/23/14 0300  PROT 6.6   CBC:  Recent Labs Lab 08/19/14 0345 08/20/14 0331 08/21/14 0338 08/22/14 0352 08/23/14 0330  WBC 24.1* 18.5* 21.7* 23.2* 25.2*  NEUTROABS  --   --   --   --  13.3*  HGB 10.6* 9.5* 9.1* 9.3* 9.9*  HCT 33.6* 30.0* 29.3* 29.9* 32.4*  MCV 94.6 94.6 96.7 96.8 97.9  PLT 181 188 198 266 330   CBG:  Recent Labs Lab 08/20/14 0800 08/21/14 0802 08/22/14 0747 08/23/14 0807 08/24/14 0802  GLUCAP 137* 131* 128* 140* 128*    Recent Results (from the past 240 hour(s))  Blood culture (routine x 2)     Status: None   Collection Time: 08/16/14 10:20 AM  Result Value Ref Range Status   Specimen Description BLOOD RIGHT ANTECUBITAL  Final   Special Requests BOTTLES DRAWN AEROBIC AND ANAEROBIC  Final   Culture  Setup Time   Final    08/16/2014 12:39 Performed at [facility]    Culture   Final    KLEBSIELLA PNEUMONIAE Note: Gram  Stain Report Called to,Read Back By and Verified With: TEVET WOODS RN 1130P 08/16/14 BY EDMOJ Performed at [facility]    Report Status 08/18/2014 FINAL  Final   Organism ID, Bacteria KLEBSIELLA PNEUMONIAE  Final      Susceptibility   Klebsiella pneumoniae - MIC*    AMPICILLIN >=32 RESISTANT Resistant     AMPICILLIN/SULBACTAM 4 SENSITIVE Sensitive     CEFAZOLIN <=4 SENSITIVE Sensitive     CEFEPIME <=1 SENSITIVE Sensitive     CEFTAZIDIME <=1 SENSITIVE Sensitive     CEFTRIAXONE <=1 SENSITIVE Sensitive     CIPROFLOXACIN <=0.25 SENSITIVE Sensitive     GENTAMICIN <=1 SENSITIVE Sensitive     IMIPENEM <=0.25 SENSITIVE Sensitive     PIP/TAZO <=4 SENSITIVE Sensitive     TOBRAMYCIN <=1 SENSITIVE Sensitive     TRIMETH/SULFA <=20 SENSITIVE Sensitive     * KLEBSIELLA PNEUMONIAE  Blood culture (routine x 2)     Status: None   Collection Time: 08/16/14 10:25 AM  Result Value Ref Range Status   Specimen Description BLOOD LEFT ANTECUBITAL  Final   Special Requests BOTTLES DRAWN AEROBIC AND ANAEROBIC  Final   Culture  Setup Time   Final    08/16/2014 12:39 Performed at [facility]    Culture   Final    KLEBSIELLA PNEUMONIAE Note: SUSCEPTIBILITIES PERFORMED ON PREVIOUS CULTURE WITHIN THE LAST 5 DAYS. Note: Gram Stain Report Called to,Read Back By and Verified With: TEVET WOODS RN 1130P Performed at [facility]    Report Status 08/18/2014 FINAL  Final  MRSA PCR Screening     Status: None   Collection Time: 08/16/14  2:10 PM  Result Value Ref Range Status   MRSA by PCR NEGATIVE NEGATIVE Final    Comment:        The GeneXpert MRSA Assay (FDA approved for NASAL specimens only), is one component of a comprehensive MRSA colonization surveillance program. It is not intended to diagnose MRSA infection nor to guide or monitor treatment for MRSA infections.   Culture, Urine     Status: None   Collection Time: 08/17/14 12:28 PM  Result Value Ref Range Status    Specimen Description URINE, RANDOM  Final   Special Requests NONE  Final   Culture  Setup Time   Final    08/17/2014  20:01 Performed at [place] Performed at [facility]   Final   Culture NO GROWTH Performed at [facility]   Final   Report Status 08/18/2014 FINAL  Final  Culture, blood (routine x 2)     Status: None (Preliminary result)   Collection Time: 08/22/14  6:20 PM  Result Value Ref Range Status   Specimen Description BLOOD RIGHT ARM  Final   Special Requests BOTTLES DRAWN AEROBIC AND ANAEROBIC 10CC  Final   Culture  Setup Time   Final    08/22/2014 23:05 Performed at [facility]    Culture   Final           BLOOD CULTURE RECEIVED NO GROWTH TO DATE CULTURE WILL BE HELD FOR 5 DAYS BEFORE ISSUING A FINAL NEGATIVE REPORT Note: Culture results may be compromised due to an excessive volume of blood received in culture bottles. Performed at [facility]    Report Status PENDING  Incomplete  Culture, blood (routine x 2)     Status: None (Preliminary result)   Collection Time: 08/22/14  6:30 PM  Result Value Ref Range Status   Specimen Description BLOOD LEFT HAND  Final   Special Requests BOTTLES DRAWN AEROBIC AND ANAEROBIC 10CC  Final   Culture  Setup Time   Final    08/22/2014 23:04 Performed at [facility]    Culture   Final           BLOOD CULTURE RECEIVED NO GROWTH TO DATE CULTURE WILL BE HELD FOR 5 DAYS BEFORE ISSUING A FINAL NEGATIVE REPORT Note: Culture results may be compromised due to an excessive volume of blood received in culture bottles. Performed at [facility]    Report Status PENDING  Incomplete  Body fluid culture     Status: None (Preliminary result)   Collection Time: 08/23/14 11:19 AM  Result Value Ref Range Status   Specimen Description PLEURAL RIGHT  Final   Special Requests NONE  Final   Gram Stain PENDING  Incomplete   Culture NO GROWTH Performed at  [facility]   Final   Report Status PENDING  Incomplete     Scheduled Meds: . albuterol  2.5 mg Nebulization Q6H  . antiseptic oral rinse  7 mL Mouth Rinse BID  . carvedilol  12.5 mg Oral BID WC  . diltiazem  120 mg Oral Q breakfast  . enoxaparin (LOVENOX) injection  1 mg/kg Subcutaneous Q24H  . famotidine  20 mg Oral Daily  . feeding supplement (ENSURE)  1 Container Oral BID BM  . folic acid  1 mg Oral Q breakfast  . imipenem-cilastatin  250 mg Intravenous Q8H  . isosorbide mononitrate  30 mg Oral Q breakfast  . omega-3 acid ethyl esters  1 g Oral Daily  . polyethylene glycol  17 g Oral QODAY  . polyvinyl alcohol  1 drop Both Eyes BID  . senna-docusate  1 tablet Oral BID  . sodium chloride  3 mL Intravenous Q12H  . triamcinolone cream   Topical UD   Continuous Infusions: . dextrose 50 mL/hr at 08/23/14 1156

## 2014-08-24 NOTE — Progress Notes (Signed)
RT completed CPT- documented but remains on Worklist.

## 2014-08-24 NOTE — Progress Notes (Signed)
PULMONARY / CRITICAL CARE MEDICINE   Name: Sara [name] MRN: [id number] DOB: Sep 21, 1918    ADMISSION DATE:  08/16/2014 CONSULTATION DATE:  12/8  REFERRING MD :  Doyle Askew   CHIEF COMPLAINT:  Acute respiratory failure HCAP   INITIAL PRESENTATION:  80 year old female who resides at a SNF w/ sig medical h/o CAF, PAD, mild dementia (but cognitive fxn fairly intact), prior AKA on right, and chronic diastolic HF Admitted on 28/3 from the SNF w/ RUL PNA. She was admitted to the SDU, placed on empiric abx and NIPPV.Culture data was positive for Klebsiella PNA. ID in 2/2 blood cultures. Infectious disease was consulted. She had made very slow progress clinically and radiographically. PCCM asked to see on 12/8 as she still was requiring NIPPV   STUDIES EVENTS CT chest 12/10: 1. Lobar consolidation of much of the right upper lobe compatible with acute pneumonia. There is a large parapneumonic right-sided pleural effusion associated with extensive passive atelectasis in the right lower lobe.2. Evidence of tracheobronchomalacia.3. Borderline enlarged and mildly enlarged right hilar and mediastinal lymph nodes are presumably reactive.4. 5 mm pulmonary nodule in the lateral segment of the right middle lobe, unchanged compared to prior study  08/23/14:  weak, back on BIPAP s/p thora with1258mL clear fluid on right siode, exudate with fluid LDH 258. Patient very deconditioned . Daughter feels no improvement after thora and frustrated  VITAL SIGNS: Temp:  [97.7 F (36.5 C)-99 F (37.2 C)] 98.1 F (36.7 C) (12/12 0800) Pulse Rate:  [49-166] 166 (12/12 0600) Resp:  [21-31] 21 (12/12 0600) BP: (118-199)/(50-97) 199/75 mmHg (12/12 0850) SpO2:  [96 %-99 %] 97 % (12/12 0823) Weight:  [65.4 kg (144 lb 2.9 oz)] 65.4 kg (144 lb 2.9 oz) (12/12 0500)  4 liters  HEMODYNAMICS:   VENTILATOR SETTINGS:   INTAKE / OUTPUT:  Intake/Output Summary (Last 24 hours) at 08/24/14 1146 Last data filed at 08/24/14 0600  Gross  per 24 hour  Intake 1223.33 ml  Output    960 ml  Net 263.33 ml    PHYSICAL EXAMINATION: General:  80 year old female, off bipap (refused). LOOKS VERY DECONDITIONED Neuro:  Awake, generalized weakness. Follows commands . HEENT: [place], no JVD  Cardiovascular:  rrr Lungs:  Basilar rales,CTA anteriorly on top Abdomen:  Soft, non-tender  Musculoskeletal:  Intact  Skin:  Intact   LABS:  PULMONARY No results for input(s): PHART, PCO2ART, PO2ART, HCO3, TCO2, O2SAT in the last 168 hours.  Invalid input(s): PCO2, PO2  CBC  Recent Labs Lab 08/21/14 0338 08/22/14 0352 08/23/14 0330  HGB 9.1* 9.3* 9.9*  HCT 29.3* 29.9* 32.4*  WBC 21.7* 23.2* 25.2*  PLT 198 266 330    COAGULATION No results for input(s): INR in the last 168 hours.  CARDIAC  No results for input(s): TROPONINI in the last 168 hours. No results for input(s): PROBNP in the last 168 hours.   CHEMISTRY  Recent Labs Lab 08/20/14 0331 08/21/14 0338 08/21/14 1400 08/22/14 0352 08/23/14 0330  NA 145 151* 148* 148* 151*  K 3.9 4.1 4.2 4.3 4.8  CL 107 112 111 111 114*  CO2 25 25 24 25 26   GLUCOSE 173* 142* 219* 113* 115*  BUN 49* 55* 55* 56* 58*  CREATININE 1.36* 1.30* 1.17* 1.20* 1.26*  CALCIUM 9.6 9.6 9.7 9.5 9.7   Estimated Creatinine Clearance: 24.9 mL/min (by C-G formula based on Cr of 1.26).   LIVER  Recent Labs Lab 08/23/14 0300  PROT 6.6  INFECTIOUS No results for input(s): LATICACIDVEN, PROCALCITON in the last 168 hours.   ENDOCRINE CBG (last 3)   Recent Labs  08/22/14 0747 08/23/14 0807 08/24/14 0802  GLUCAP 128* 140* 128*         IMAGING x48h Ct Chest Wo Contrast  08/22/2014   CLINICAL DATA:  80 year old female with history of right lower lobe pneumonia. Shortness breath and weakness. History of lymphoma.  EXAM: CT CHEST WITHOUT CONTRAST  TECHNIQUE: Multidetector CT imaging of the chest was performed following the standard protocol without IV contrast.  COMPARISON:   Chest CT 10/18/2012.  FINDINGS: Mediastinum: Heart size is mildly enlarged. There is no significant pericardial fluid, thickening or pericardial calcification. Severe mitral annular calcifications, likely related to remote rheumatic fever. Calcifications of the aortic valve. There is atherosclerosis of the thoracic aorta, the great vessels of the mediastinum and the coronary arteries, including calcified atherosclerotic plaque in the left main, left anterior descending, left circumflex and right coronary arteries. Multiple borderline enlarged and mildly enlarged right hilar and mediastinal lymph nodes measuring up to 11 mm in short axis in the low right paratracheal station. The full extent of right hilar adenopathy is poorly evaluated on today's non contrast CT examination. Esophagus is unremarkable in appearance.  Lungs/Pleura: Extensive airspace consolidation is noted in the right upper lobe where there are multiple air bronchograms, compatible with pneumonia. There is a large right-sided pleural effusion which layers dependently and is simple in appearance. This is associated with extensive passive atelectasis in the right lower lobe. Trace left pleural effusion. There is multifocal opacification of the bronchi in the lungs bilaterally, most notable in the left lower lobe, which likely represents retained secretions. 5 mm nodule in the lateral segment of the right middle lobe (image 34 of series 5), unchanged compared to prior study 10/18/2012. Dynamic compression and enlargement of the trachea and mainstem bronchi with respiration, indicative of tracheobronchomalacia.  Upper Abdomen: Extensive atherosclerosis. Atrophy of the left kidney. Right kidney is not visualize.  Musculoskeletal: There are no aggressive appearing lytic or blastic lesions noted in the visualized portions of the skeleton. Chronic compression fracture of T12 with post vertebroplasty changes and approximately 60% loss of anterior vertebral  body height is unchanged.  IMPRESSION: 1. Lobar consolidation of much of the right upper lobe compatible with acute pneumonia. There is a large parapneumonic right-sided pleural effusion associated with extensive passive atelectasis in the right lower lobe. 2. Evidence of tracheobronchomalacia. 3. Borderline enlarged and mildly enlarged right hilar and mediastinal lymph nodes are presumably reactive. 4. 5 mm pulmonary nodule in the lateral segment of the right middle lobe, unchanged compared to prior study 10/18/2012, statistically benign. 5. Atherosclerosis, including left main and 3 vessel coronary artery disease. 6. There are calcifications of the aortic valve and mitral annulus. Echocardiographic correlation for evaluation of potential valvular dysfunction may be warranted if clinically indicated.   Electronically Signed   By: Vinnie Langton M.D.   On: 08/22/2014 14:01   Dg Chest Port 1 View  08/23/2014   CLINICAL DATA:  Pneumonia. Respiratory distress and pleural effusions. Lymphoma.  EXAM: PORTABLE CHEST - 1 VIEW  COMPARISON:  08/16/2014  FINDINGS: The heart is borderline enlarged but stable. There is tortuosity and calcification of the thoracic aorta. Persistent but slight improved right upper lobe infiltrate. Chronic bronchitic type interstitial changes. No pleural effusion.  IMPRESSION: Persistent but slight improved right upper lobe infiltrate.   Electronically Signed   By: Kalman Jewels M.D.   On:  08/23/2014 11:43       ASSESSMENT / PLAN:  PULMONARY OETT A: acute respiratory failure in the setting of HCAP vs aspiration -slow progress     Large right pleural effusion - exudate s.p 1.2L removal 08/23/14   - mild resp distress. Not needing bipap after thora. But physcially exhausted  P:   Cont NIPPV PRN mobilize Scheduled BDs Await thora results 08/23/14   CARDIOVASCULAR CVL A:  Sepsis in the setting of bacteremia  Chronic diastolic HF HTN PAF -brady on coreg &  cardizem  P:  Cont tele monitoring  Keep euvolemic  Hold parameters on BB, CCB -resume labetalol if cannot take PO while on bipap  RENAL A:   AKI: scr had improved after holding lasix  Hypernatremia  P:   Per [facility]  GASTROINTESTINAL A:   Possible dysphagia P:   Aspiration precautions  PPI   HEMATOLOGIC A:   H/o T cell lymphoma of multiple LNs w/ multiple sites  Anemia of chronic illness  Persistent leucocytosis P:  Trend cbc [place] LMWH PRBC for hgb < 7gm%  INFECTIOUS A:   Sepsis w/ klebsiella bacteremia, likely from UTI 11/21.  Aspiration vs HCAP  P:   BCx2  12/6: Klebsiella pneumoniae- R to ampi UC 12/5: neg, 11/21 klebsiella  maxipime 12/4>>>12/9 Imipenem 12/9 >> Defer to ID   ENDOCRINE A:   No acute  P:   Trend glucose   NEUROLOGIC A:  Acute encephalopathy  P:   RASS goal: -0 D/c'd sedating meds Supportive care     GLOBAL 1`10/25/13: Physical deconditioning biggest issue. Anticipate slow recovery with high risk for setback. Daughter frustrated by situation and did not seem to be too happy with news that was not more hopeful.  PCCM will see again Monday 08/26/14  Dr. Brand Males, M.D., F.C.C.P Pulmonary and Critical Care Medicine Staff Physician [facility] Pager: [phone], If no answer or between  15:00h - 7:00h: call [phone]  08/24/2014 12:00 PM

## 2014-08-24 NOTE — Progress Notes (Signed)
RT placed PT on BiPAP- settings in EPIC- RN aware.

## 2014-08-25 LAB — BASIC METABOLIC PANEL
ANION GAP: 10 (ref 5–15)
BUN: 54 mg/dL — ABNORMAL HIGH (ref 6–23)
CHLORIDE: 111 meq/L (ref 96–112)
CO2: 24 meq/L (ref 19–32)
Calcium: 8.8 mg/dL (ref 8.4–10.5)
Creatinine, Ser: 1.2 mg/dL — ABNORMAL HIGH (ref 0.50–1.10)
GFR calc Af Amer: 44 mL/min — ABNORMAL LOW (ref >90)
GFR calc non Af Amer: 38 mL/min — ABNORMAL LOW (ref >90)
Glucose, Bld: 118 mg/dL — ABNORMAL HIGH (ref 70–99)
Potassium: 4.7 mEq/L (ref 3.7–5.3)
SODIUM: 145 meq/L (ref 137–147)

## 2014-08-25 LAB — CBC
HCT: 29.7 % — ABNORMAL LOW (ref 36.0–46.0)
HEMOGLOBIN: 9.3 g/dL — AB (ref 12.0–15.0)
MCH: 30.9 pg (ref 26.0–34.0)
MCHC: 31.3 g/dL (ref 30.0–36.0)
MCV: 98.7 fL (ref 78.0–100.0)
Platelets: 333 10*3/uL (ref 150–400)
RBC: 3.01 MIL/uL — AB (ref 3.87–5.11)
RDW: 14.3 % (ref 11.5–15.5)
WBC: 29.3 10*3/uL — ABNORMAL HIGH (ref 4.0–10.5)

## 2014-08-25 LAB — GLUCOSE, CAPILLARY: GLUCOSE-CAPILLARY: 120 mg/dL — AB (ref 70–99)

## 2014-08-25 NOTE — Progress Notes (Signed)
Pt placed back on the BIPAP at 2121 per Family request, pt tachypenic.

## 2014-08-25 NOTE — Progress Notes (Signed)
At about 2049, pt's daughter Dyann Ruddle called me to her room saying patient want the BIPAP taken off her face. On assessment , I noted patient grimacing and pulling on her BIPAP mask. Pt was taken off the BIPAP at about 2050, she is currently on 3L [place]. Pt alert and in no acute distress, will continue to monitor her thru this shift.

## 2014-08-25 NOTE — Progress Notes (Signed)
Patient ID: Sara [name], female   DOB: [name], 80 y.o.   MRN: [id number]  [facility] PROGRESS NOTE  Sara [name] Sara [name] DOB: Sara [name] DOA: 08/16/2014 PCP: Sara Dooms, MD   Brief narrative: 80 year old female with past medical history of PAD status post stenting (LICA 11/4740), right AKA, paroxysmal atrial fibrillation on AC with pradaxa,dementia, hypertension, chronic diastolic CHF (2 D ECHO in 08/2014 with EF of 55%, wheelchair bound who presented from [place] to [facility] ED with reports of not feeling very well and reports of fevers. She is not a very good historian due to dementia. Apparently no respiratory distress but on admission patient was found to have O2 saturation of 86%. No other complaints.  In ED, BP was 71/42 to 141/49, HR 59-91, RR 25 and T max 102 F. Oxygen saturation was 86% on room air, improved to 92% with Sara [name]. CXR consistent with right upper lung lobe pneumonia. She was started on broad spectrum abx, cefepime and vanco for sepsis and pneumonia. She is DNR and stable with current BP of 141/49 to go to telemetry floor.   Assessment and Plan:   Principal Problem: Severe sepsis / Leukocytosis / HCAP  - sepsis criteria met with initial vitals hypotension, tachypnea, bradycardia, T max 102 F, leukocytosis, evidence of end organ damange which includes hypoxia and ARF, evidence of PNA on CXR. - blood cultures positive for Klebsiella (source possible from recent UTI, urine culture from 11/26 positive for Klebsiella)  - started broad spectrum antibiotics Vancomycin and Maxipime 12/4. Vanc d/c per ID 12/07, maxipime d/c 12/08 - ABX changed to Primaxin to cover for aspiration PNA 12/08 - Ct Chest with large parapneumonic right-sided pleural effusion a/w extensive passive atelectasis RLL - s/p right sided thoracentesis by PCCM (12/11), final report pending  - pt with less dyspnea this AM and not requiring BiPAP  Active  Problems: Chronic diastolic HF (heart failure) - last BNP in 12/2013 with BNP in 300-400 range. 2 D ECHO in 08/2013 showed EF 55%. - weight from admission: 136 lbs --> 151 lbs --> 143 lbs --> 144 --> 151 lbs this AM - stopped IVF 12/06, restarted D5 12/12, will stop IVF 12/13 as weight is trending up and pt with more upper extremity edema  - pt started on Lasix 20 mg IV BID 12/6, holding Lasix since 12/08 - continue to monitor daily weights, strict I's and O's Hypernatremia - Na is WNL this AM, stop IVF as pt more edematous this AM 12/13 - repeat BMP in AM HTN (hypertension) - resumed Coreg 12. 5 mg PO BID and Cardizem 12/06, may need to hold due to mild bradycardia  - resumed Imdur 12/07, pt unable to take PO 12/11 as she is requiring BiPAP - continue to hold Lisinopril and Lasix  - provide Labetalol as needed IV until able to take PO  Leukocytosis - pt with known Lymphoma so wondering if this is contributing  - appreciate Dr. Alvy Bimler assistance  PAF (paroxysmal atrial fibrillation) / Chronic anticoagulation- Pradaxa - on pradaxa but on hold due to worsening renal failure, low GFR S/P R AKA due to PAD and non healing wound / PAD - LICA stent 5/95 - stable - continue gabapentin  Stroke, acute, Rt brain, embolic March 6387 - pt is on pradaxa but still on hold due to worsening renal failure. 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 - hold targretin in the setting of an acute infection, sepsis ARF (acute renal failure)  - continue to hold lisinopril and lasix  - Cr remains stable ~ 1.2  Anemia of chronic disease - secondary to peripheral T cell lymphoma and chemotherapy  - hemoglobin is 11 on admission and slightly down since admission - no signs of active bleeding  - no current indications for transfusion  GERD (gastroesophageal  reflux disease) - continue Pepcid 40 mg daily  Hypokalemia - supplemented and WNL this AM Severe PCM - in the context of acute illness - advance diet as pt able to tolerate  - aspiration precautions  Functional quadriplegia - bed bound since admission - attempt to get OOB to chair  Left LE wounds, PAD - Full thickness tissue loss on LLE, pretibial area, dried eschar on left LE lateral aspect. - Pretibial wound presents as a partially avulsed area (skin tear) measuring 3cm x 2cm x 0.2cm - eschar on the LLE (lateral aspect) measures 3.5cm x 2.5cm. - clean with betadine solution twice daily and cover with a dry dressing when it has air-dried  DVT prophylaxis:  - enoxaparin subQ since admission   Code Status: Partial, no intubation  Family Communication: no family at bedside  Disposition Plan: Remains inpatient   IV Access:    Peripheral IV Procedures and diagnostic studies:    CXR 08/21/2014 Stable dense right upper lobe pneumonia associated with progressive volume loss over the past 5 days. A possible right hilar or perihilar mass is now all better visible due to the progressive right upper lobe volume loss. CT of the chest with contrast and possibly bronchoscopy may be helpful in further evaluation. Stable small bilateral pleural effusions. Stable passive atelectasis versus pneumonia in the left lower lobe. Increasing patchy pneumonia in the right lower lobe  CXR 08/19/2014 Persistent RUL airspace disease, ? central obstructing lesion. small pleural effusions, atelectasis.   CXR12/01/2014 Persistent dense consolidative right upper lobe pneumonia. Background COPD/ emphysema.   CXR12/12/2013 Increasing right upper lobe pneumonia.   CXR 08/16/2014 Right upper lobe pneumonia. Post treatment radiographs recommended to document resolution.  Medical Consultants:    PCCM  ID Other Consultants:    Physical therapy   Anti-Infectives:    Vancomycin 12/04 --> 12/07  Maxipime 12/04 --> 12/08  Primaxin 12/08 -->   Sara Ramsay, MD  [facility] Pager [phone]  If 7PM-7AM, please contact night-coverage [email or web address] Password [email or web address] 08/25/2014, 8:04 AM   LOS: 9 days   HPI/Subjective: No events overnight.   Objective: Filed Vitals:   08/24/14 2016 08/25/14 0000 08/25/14 0400 08/25/14 0756  BP: 139/66 151/50 170/81 170/81  Pulse: 76 60 60   Temp:  99.2 F (37.3 C) 98.6 F (37 C)   TempSrc:  Axillary Axillary   Resp: $Remo'27 20 23   'pIsMJ$ Height:      Weight:   68.5 kg (151 lb 0.2 oz)   SpO2: 96% 98%      Intake/Output Summary (Last 24 hours) at 08/25/14 0804 Last data filed at 08/25/14 0600  Gross per 24 hour  Intake   1350 ml  Output   1075 ml  Net    275 ml    Exam:   General:  Pt is alert, tired, NAD, on [place]   Cardiovascular: Irregular rate and rhythm, S1/S2, no rubs, no gallops  Respiratory: diminished breath sounds at bases R> L, mild rhonchi at right base   Abdomen: Soft,  non tender, non distended, bowel sounds present, no guarding  Extremities: bilateral UE edema, RKA   Data Reviewed: Basic Metabolic Panel:  Recent Labs Lab 08/21/14 1400 08/22/14 0352 08/23/14 0330 08/24/14 1158 08/25/14 0344  NA 148* 148* 151* 148* 145  K 4.2 4.3 4.8 4.5 4.7  CL 111 111 114* 113* 111  CO2 $Re'24 25 26 24 24  'UYV$ GLUCOSE 219* 113* 115* 127* 118*  BUN 55* 56* 58* 57* 54*  CREATININE 1.17* 1.20* 1.26* 1.15* 1.20*  CALCIUM 9.7 9.5 9.7 8.7 8.8   Liver Function Tests:  Recent Labs Lab 08/23/14 0300  PROT 6.6   CBC:  Recent Labs Lab 08/21/14 0338 08/22/14 0352 08/23/14 0330 08/24/14 1158 08/25/14 0344  WBC 21.7* 23.2* 25.2* 26.5* 29.3*  NEUTROABS  --   --  13.3*  --   --   HGB 9.1* 9.3* 9.9* 9.1* 9.3*  HCT 29.3* 29.9* 32.4* 29.7* 29.7*  MCV 96.7 96.8 97.9 97.1 98.7  PLT 198 266 330 328 333   CBG:  Recent Labs Lab 08/20/14 0800 08/21/14 0802 08/22/14 0747  08/23/14 0807 08/24/14 0802  GLUCAP 137* 131* 128* 140* 128*    Recent Results (from the past 240 hour(s))  Blood culture (routine x 2)     Status: None   Collection Time: 08/16/14 10:20 AM  Result Value Ref Range Status   Culture  Setup Time   Final   Culture   Final    KLEBSIELLA PNEUMONIAE   Report Status 08/18/2014 FINAL  Final   Organism ID, Bacteria KLEBSIELLA PNEUMONIAE  Final      Susceptibility   Klebsiella pneumoniae - MIC*    AMPICILLIN >=32 RESISTANT Resistant     AMPICILLIN/SULBACTAM 4 SENSITIVE Sensitive     CEFAZOLIN <=4 SENSITIVE Sensitive     CEFEPIME <=1 SENSITIVE Sensitive     CEFTAZIDIME <=1 SENSITIVE Sensitive     CEFTRIAXONE <=1 SENSITIVE Sensitive     CIPROFLOXACIN <=0.25 SENSITIVE Sensitive     GENTAMICIN <=1 SENSITIVE Sensitive     IMIPENEM <=0.25 SENSITIVE Sensitive     PIP/TAZO <=4 SENSITIVE Sensitive     TOBRAMYCIN <=1 SENSITIVE Sensitive     TRIMETH/SULFA <=20 SENSITIVE Sensitive     * KLEBSIELLA PNEUMONIAE  Blood culture (routine x 2)     Status: None   Collection Time: 08/16/14 10:25 AM  Result Value Ref Range Status   Culture  Setup Time   Final   Culture   Final    KLEBSIELLA PNEUMONIAE   Report Status 08/18/2014 FINAL  Final  MRSA PCR Screening     Status: None   Collection Time: 08/16/14  2:10 PM  Result Value Ref Range Status   MRSA by PCR NEGATIVE NEGATIVE Final  Culture, Urine     Status: None   Collection Time: 08/17/14 12:28 PM  Result Value Ref Range Status   Specimen Description URINE, RANDOM  Final   Special Requests NONE  Final   Culture  Setup Time   Final    08/17/2014 20:01 Performed at [place] Performed at [facility]   Final   Culture NO GROWTH Performed at [facility]   Final   Report Status 08/18/2014 FINAL  Final  Culture, blood (routine x 2)     Status: None (Preliminary result)   Collection Time: 08/22/14  6:20 PM  Result Value Ref Range Status    Specimen Description BLOOD RIGHT ARM  Final  Special Requests BOTTLES DRAWN AEROBIC AND ANAEROBIC 10CC  Final   Culture  Setup Time   Final    08/22/2014 23:05 Performed at [facility]    Culture   Final           BLOOD CULTURE RECEIVED NO GROWTH TO DATE CULTURE WILL BE HELD FOR 5 DAYS BEFORE ISSUING A FINAL NEGATIVE REPORT Note: Culture results may be compromised due to an excessive volume of blood received in culture bottles. Performed at [facility]    Report Status PENDING  Incomplete  Culture, blood (routine x 2)     Status: None (Preliminary result)   Collection Time: 08/22/14  6:30 PM  Result Value Ref Range Status   Specimen Description BLOOD LEFT HAND  Final   Special Requests BOTTLES DRAWN AEROBIC AND ANAEROBIC 10CC  Final   Culture  Setup Time   Final    08/22/2014 23:04 Performed at [facility]    Culture   Final           BLOOD CULTURE RECEIVED NO GROWTH TO DATE CULTURE WILL BE HELD FOR 5 DAYS BEFORE ISSUING A FINAL NEGATIVE REPORT Note: Culture results may be compromised due to an excessive volume of blood received in culture bottles. Performed at [facility]    Report Status PENDING  Incomplete  AFB culture with smear     Status: None (Preliminary result)   Collection Time: 08/23/14 11:19 AM  Result Value Ref Range Status   Specimen Description PLEURAL RIGHT  Final   Special Requests NONE  Final   Acid Fast Smear   Final    NO ACID FAST BACILLI SEEN Performed at [facility]    Culture   Final    CULTURE WILL BE EXAMINED FOR 6 WEEKS BEFORE ISSUING A FINAL REPORT Performed at [facility]    Report Status PENDING  Incomplete  Body fluid culture     Status: None (Preliminary result)   Collection Time: 08/23/14 11:19 AM  Result Value Ref Range Status   Specimen Description PLEURAL RIGHT  Final   Special Requests NONE  Final   Gram Stain   Final    RARE WBC PRESENT, PREDOMINANTLY MONONUCLEAR NO ORGANISMS  SEEN Performed at [facility]    Culture NO GROWTH Performed at [facility]   Final   Report Status PENDING  Incomplete     Scheduled Meds: . antiseptic oral rinse  7 mL Mouth Rinse BID  . carvedilol  12.5 mg Oral BID WC  . diltiazem  120 mg Oral Q breakfast  . enoxaparin (LOVENOX) injection  1 mg/kg Subcutaneous Q24H  . famotidine  20 mg Oral Daily  . feeding supplement (ENSURE)  1 Container Oral BID BM  . folic acid  1 mg Oral Q breakfast  . imipenem-cilastatin  250 mg Intravenous Q8H  . isosorbide mononitrate  30 mg Oral Q breakfast  . omega-3 acid ethyl esters  1 g Oral Daily  . polyethylene glycol  17 g Oral QODAY  . polyvinyl alcohol  1 drop Both Eyes BID  . senna-docusate  1 tablet Oral BID  . sodium chloride  3 mL Intravenous Q12H  . triamcinolone cream   Topical UD   Continuous Infusions: . dextrose 50 mL/hr at 08/23/14 1156

## 2014-08-25 NOTE — Progress Notes (Signed)
Pt asked for the BIPAP to be taken off her face, BIPAP taken off at 2141. Vital signs currently; HR 71, BP 140/90, RR 26, O2 sats 98% 3L [place].

## 2014-08-25 NOTE — Progress Notes (Signed)
Pt WOB increased with some accessory muscle usage.  Pt placed on BiPAP 10/5 with FiO2 of 35%.  Pt stable and comfortable.  RN aware.

## 2014-08-26 DIAGNOSIS — J9601 Acute respiratory failure with hypoxia: Secondary | ICD-10-CM

## 2014-08-26 LAB — CBC
HCT: 31.8 % — ABNORMAL LOW (ref 36.0–46.0)
Hemoglobin: 9.5 g/dL — ABNORMAL LOW (ref 12.0–15.0)
MCH: 29.8 pg (ref 26.0–34.0)
MCHC: 29.9 g/dL — ABNORMAL LOW (ref 30.0–36.0)
MCV: 99.7 fL (ref 78.0–100.0)
PLATELETS: 359 10*3/uL (ref 150–400)
RBC: 3.19 MIL/uL — AB (ref 3.87–5.11)
RDW: 13.8 % (ref 11.5–15.5)
WBC: 27 10*3/uL — AB (ref 4.0–10.5)

## 2014-08-26 LAB — BASIC METABOLIC PANEL
ANION GAP: 9 (ref 5–15)
BUN: 45 mg/dL — ABNORMAL HIGH (ref 6–23)
CHLORIDE: 111 meq/L (ref 96–112)
CO2: 25 meq/L (ref 19–32)
Calcium: 8.8 mg/dL (ref 8.4–10.5)
Creatinine, Ser: 0.99 mg/dL (ref 0.50–1.10)
GFR calc non Af Amer: 48 mL/min — ABNORMAL LOW (ref >90)
GFR, EST AFRICAN AMERICAN: 55 mL/min — AB (ref >90)
Glucose, Bld: 143 mg/dL — ABNORMAL HIGH (ref 70–99)
POTASSIUM: 5.1 meq/L (ref 3.7–5.3)
SODIUM: 145 meq/L (ref 137–147)

## 2014-08-26 LAB — HEPARIN ANTI-XA: HEPARIN LMW: 0.58 [IU]/mL

## 2014-08-26 LAB — GLUCOSE, CAPILLARY: GLUCOSE-CAPILLARY: 147 mg/dL — AB (ref 70–99)

## 2014-08-26 MED ORDER — JEVITY 1.2 CAL PO LIQD: 1000.0000 mL | ORAL | Status: DC

## 2014-08-26 MED ORDER — SODIUM CHLORIDE 0.45 % IV SOLN
INTRAVENOUS | Status: DC
  Administered 2014-08-26: 21:00:00 via INTRAVENOUS

## 2014-08-26 MED ORDER — ENOXAPARIN SODIUM 80 MG/0.8ML ~~LOC~~ SOLN
1.0000 mg/kg | Freq: Two times a day (BID) | SUBCUTANEOUS | Status: DC
  Administered 2014-08-27: 65 mg via SUBCUTANEOUS
  Filled 2014-08-26 (×4): qty 0.8

## 2014-08-26 NOTE — Progress Notes (Signed)
[place] for Enoxaparin Indication: atrial fibrillation, history of embolic stroke  Allergies  Allergen Reactions  . Chlorhexidine Dermatitis  . Evista [Raloxifene]     unknown  . Penicillins Other (See Comments)    unknown  . Triamterene Other (See Comments)    unknown  . Alendronate Sodium Rash    Patient Measurements: Height: 5' 2.3" (158.2 cm) Weight: 151 lb 3.8 oz (68.6 kg) IBW/kg (Calculated) : 50.79 Heparin Dosing Weight:   Vital Signs: Temp: 98.4 F (36.9 C) (12/14 0400) Temp Source: Axillary (12/14 0400) BP: 197/62 mmHg (12/14 0720) Pulse Rate: 92 (12/14 0720)  Labs:  Recent Labs  08/24/14 1158 08/25/14 0344 08/26/14 0335  HGB 9.1* 9.3* 9.5*  HCT 29.7* 29.7* 31.8*  PLT 328 333 359  CREATININE 1.15* 1.20* 0.99    Estimated Creatinine Clearance: 32.5 mL/min (by C-G formula based on Cr of 0.99).   Medical History: Past Medical History  Diagnosis Date  . Hypertension   . Atrial fibrillation   . Psoriasis   . Congestive heart failure (CHF)   . History of CVA (cerebrovascular accident)   . Coronary artery disease   . Myocardial infarction   . CHF (congestive heart failure)   . Shortness of breath   . H/O hiatal hernia   . Closed fracture of head of left humerus 5/12  . Basal cell carcinoma   . 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  Flow cytometry 04/04/12: 91% cells CD4 positive CD 26 negative  . Hyperlipemia   . Pneumonia   . Wound of right leg 09/25/2012    Necrotic, open wound right lower leg; non healing  . Stroke   . Hyposmolality and/or hyponatremia   . Lower limb amputation, above knee 10/11/2012  . Pain in joint, multiple sites   . Mycosis fungoides, unspecified site, extranodal and solid organ sites 09/28/2012  . Occlusion and stenosis of carotid artery  without mention of cerebral infarction 09/28/2012  . Chronic venous hypertension with ulcer and inflammation 09/28/2012  . Diverticulosis of colon (without mention of hemorrhage) 09/28/2012  . Synovial cyst of popliteal space 03/26/2012  . Unspecified constipation 03/24/2012  . Hypopotassemia 01/26/2012  . Debility, unspecified 01/17/2012  . Anxiety state, unspecified 12/31/2011  . Anemia, unspecified 12/21/2011  . Other specified disease of white blood cells 12/21/2011  . Peripheral vascular disease, unspecified 12/21/2011    recent ABI Lt of 0.64 down from ).80 on 12/29/11- though different labs  . Reflux esophagitis 12/21/2011  . Unspecified hereditary and idiopathic peripheral neuropathy 12/14/2011  . Pain in joint, ankle and foot 12/14/2011  . Carotid stenosis     CAROTID DOPPLER, 05/02/2012 - LEFT VERTEBRAL-occluded, LEFT BULB AND PROXIMAL ICA STENT-moderate amount of irregular mixed dense plaque 50-69% diameter reduction  . Pre-syncope     NUCLEAR STRESS TEST, 12/10/2009 - normal, EKG negative for ischemia  . TIA (transient ischemic attack)     2D ECHO, 12/05/2012 - EF 60-65%, Severely calcified annulus of the mitral valve with mild-moderate regurgitation, LA moderate-severely dilated  . Paroxysmal atrial fibrillation   . CAD in native artery     3 vessel CAD, medical therapy, not a candidate for CABG  . S/P AKA (above knee amputation) unilateral, Rt leg due to PAD and non healing wound 06/27/2013  . HCAP (healthcare-associated pneumonia) 01/08/2012  . Peripheral T  cell lymphoma of lymph nodes of multiple sites 05/28/2014  . 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  She is also on chronic dabigatran therapy for paroxysmal atrial fibrillation.  She was noted to have ARF on admission with CrCl ~47ml/min and also on diltiazem (a moderate dual CYP-3A4 /p-gyp inhibitor), so  probably best to avoid Dabigatran until SCr improves.  Discussed with Dr. Charlies Silvers on admission and again with Dr. Doyle Askew 12/7--will use enoxaparin in meantime; although enoxaparin is also renally eliminated, we will at least be able to check anti-Xa levels if needed.    Last dose of dabigatran 12/3 at 8pm  H/H, Pltc stable  Scr slowly improving, est CrCl ~ 32 mL/min today  No reported bleeding  Goal of Therapy:  Anti-Xa level 0.6-1 units/ml 4hrs after LMWH dose given Monitor platelets by anticoagulation protocol: Yes   Plan:   Continue Lovenox 1 mg/kg SQ q24h for now.  Check anti-Xa level 4 hours after dose given today to assess, as SCr slowly improving, CrCl borderline right above 30 mL/min.  Continue to monitor renal function, CBC, and for signs/symptoms of bleeding.   Lindell Spar, PharmD, BCPS Pager: [phone] 08/26/2014 9:45 AM

## 2014-08-26 NOTE — Progress Notes (Signed)
Patient ID: Sara [name], female   DOB: December 01, 1918, 80 y.o.   MRN: [id number]  [facility] PROGRESS NOTE  Sara [name] ELF:[id number] DOB: 05-10-1919 DOA: 08/16/2014 PCP: Estill Dooms, MD   Brief narrative: 80 year old female with past medical history of PAD status post stenting (LICA 58/5277), right AKA, paroxysmal atrial fibrillation on AC with pradaxa,dementia, hypertension, chronic diastolic CHF (2 D ECHO in 08/2014 with EF of 55%, wheelchair bound who presented from [place] to [facility] ED with reports of not feeling very well and reports of fevers. She is not a very good historian due to dementia. Apparently no respiratory distress but on admission patient was found to have O2 saturation of 86%. No other complaints.  In ED, BP was 71/42 to 141/49, HR 59-91, RR 25 and T max 102 F. Oxygen saturation was 86% on room air, improved to 92% with [place] oxygen support. CXR consistent with right upper lung lobe pneumonia. She was started on broad spectrum abx, cefepime and vanco for sepsis and pneumonia. She is DNR and stable with current BP of 141/49 to go to telemetry floor.   Assessment and Plan:   Principal Problem: Severe sepsis / Leukocytosis / HCAP  - sepsis criteria met with initial vitals hypotension, tachypnea, bradycardia, T max 102 F, leukocytosis, evidence of end organ damange which includes hypoxia and ARF, evidence of PNA on CXR. - blood cultures positive for Klebsiella (source possible from recent UTI, urine culture from 11/26 positive for Klebsiella)  - started broad spectrum antibiotics Vancomycin and Maxipime 12/4. Vanc d/c per ID 12/07, maxipime d/c 12/08 - ABX changed to Primaxin to cover for aspiration PNA 12/08 - Ct Chest with large parapneumonic right-sided pleural effusion a/w extensive passive atelectasis RLL - s/p right sided thoracentesis by PCCM (12/11), final report pending  - pt with very slow clinical improvement, tired, refusing care at time -  asked for PCT assistance at this time   Active Problems: Chronic diastolic HF (heart failure) - last BNP in 12/2013 with BNP in 300-400 range. 2 D ECHO in 08/2013 showed EF 55%. - weight from admission: 136 lbs --> 151 lbs --> 143 lbs --> 144 --> 151 lbs this AM - stopped IVF 12/06, restarted D5 12/12, will stop IVF 12/13 as weight is trending up and pt with more upper extremity edema  - pt started on Lasix 20 mg IV BID 12/6, holding Lasix since 12/08 - continue to monitor daily weights, strict I's and O's Hypernatremia - Na is WNL this AM, stopped IVF 12/13 due to worsening upper extremity edema  - repeat BMP in AM HTN (hypertension), accelerated  - still difficult to control  - resumed Coreg 12. 5 mg PO BID and Cardizem 12/06 but pt has not been taking much PO  - resumed Imdur 12/07 - continue to hold Lisinopril and Lasix  - provide Labetalol as needed IV until able to take PO  Leukocytosis - pt with known Lymphoma - appreciate Dr. Alvy Bimler assistance  PAF (paroxysmal atrial fibrillation) / Chronic anticoagulation- Pradaxa - on pradaxa but on hold due to worsening renal failure, low GFR S/P R AKA due to PAD and non healing wound / PAD - LICA stent 8/24 - stable - continue gabapentin  Stroke, acute, Rt brain, embolic March 2353 - pt is on pradaxa but still on hold due to worsening renal failure. 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 - hold targretin in the setting of an acute infection, sepsis ARF (acute renal failure)  - continue to hold lisinopril and lasix  - Cr now WNL Anemia of chronic disease - secondary to peripheral T cell lymphoma and chemotherapy  - hemoglobin is 11 on admission and slightly down since admission - no signs of active bleeding  - no current indications for transfusion  GERD (gastroesophageal reflux  disease) - continue Pepcid 40 mg daily  Hypokalemia - supplemented and WNL this AM Severe PCM - in the context of acute illness - advance diet as pt able to tolerate  - aspiration precautions  Functional quadriplegia - bed bound since admission - progressive FTT and deconditioning  - PCT consult  Left LE wounds, PAD - Full thickness tissue loss on LLE, pretibial area, dried eschar on left LE lateral aspect. - Pretibial wound presents as a partially avulsed area (skin tear) measuring 3cm x 2cm x 0.2cm - eschar on the LLE (lateral aspect) measures 3.5cm x 2.5cm. - clean with betadine solution twice daily and cover with a dry dressing when it has air-dried  DVT prophylaxis:  - enoxaparin subQ since admission   Code Status: Partial, no intubation  Family Communication: no family at bedside  Disposition Plan: Remains inpatient   IV Access:    Peripheral IV Procedures and diagnostic studies:    CXR 08/21/2014 Stable dense right upper lobe pneumonia associated with progressive volume loss over the past 5 days. A possible right hilar or perihilar mass is now all better visible due to the progressive right upper lobe volume loss. CT of the chest with contrast and possibly bronchoscopy may be helpful in further evaluation. Stable small bilateral pleural effusions. Stable passive atelectasis versus pneumonia in the left lower lobe. Increasing patchy pneumonia in the right lower lobe  CXR 08/19/2014 Persistent RUL airspace disease, ? central obstructing lesion. small pleural effusions, atelectasis.   CXR12/01/2014 Persistent dense consolidative right upper lobe pneumonia. Background COPD/ emphysema.   CXR12/12/2013 Increasing right upper lobe pneumonia.   CXR 08/16/2014 Right upper lobe pneumonia. Post treatment radiographs recommended to document resolution.  Medical Consultants:    PCCM  ID Other Consultants:    Physical  therapy  Anti-Infectives:    Vancomycin 12/04 --> 12/07  Maxipime 12/04 --> 12/08  Primaxin 12/08 -->   [name], MD  [facility] Pager [phone]  If 7PM-7AM, please contact night-coverage [email or web address] Password [email or web address] 08/26/2014, 2:25 PM   LOS: 10 days   HPI/Subjective: No events overnight.   Objective: Filed Vitals:   08/26/14 0400 08/26/14 0550 08/26/14 0720 08/26/14 0800  BP: 174/72 166/84 197/62   Pulse: 69 75 92   Temp: 98.4 F (36.9 C)   97.1 F (36.2 C)  TempSrc: Axillary   Axillary  Resp: _0 Height:      Weight: 68.6 kg (151 lb 3.8 oz)     SpO2: 97% 93% 94%     Intake/Output Summary (Last 24 hours) at 08/26/14 1425 Last data filed at 08/26/14 1231  Gross per 24 hour  Intake    706 ml  Output   1100 ml  Net   -394 ml    Exam:   General:  Pt is somnolent but opens eyes when called by name   Cardiovascular: Regular rate and rhythm,  no rubs, no gallops  Respiratory: Rhonchi at bases and very poor inspiratory effort  Abdomen: Soft, non tender, non  distended, bowel sounds present, no guarding  Extremities: R AKA  Data Reviewed: Basic Metabolic Panel:  Recent Labs Lab 08/22/14 0352 08/23/14 0330 08/24/14 1158 08/25/14 0344 08/26/14 0335  NA 148* 151* 148* 145 145  K 4.3 4.8 4.5 4.7 5.1  CL 111 114* 113* 111 111  CO2 _0 GLUCOSE 113* 115* 127* 118* 143*  BUN 56* 58* 57* 54* 45*  CREATININE 1.20* 1.26* 1.15* 1.20* 0.99  CALCIUM 9.5 9.7 8.7 8.8 8.8   Liver Function Tests:  Recent Labs Lab 08/23/14 0300  PROT 6.6   No results for input(s): LIPASE, AMYLASE in the last 168 hours. No results for input(s): AMMONIA in the last 168 hours. CBC:  Recent Labs Lab 08/22/14 0352 08/23/14 0330 08/24/14 1158 08/25/14 0344 08/26/14 0335  WBC 23.2* 25.2* 26.5* 29.3* 27.0*  NEUTROABS  --  13.3*  --   --   --   HGB 9.3* 9.9* 9.1* 9.3* 9.5*  HCT 29.9* 32.4* 29.7* 29.7* 31.8*  MCV 96.8 97.9 97.1 98.7 99.7   PLT 266 330 328 333 359   Cardiac Enzymes: No results for input(s): CKTOTAL, CKMB, CKMBINDEX, TROPONINI in the last 168 hours. BNP: Invalid input(s): POCBNP CBG:  Recent Labs Lab 08/22/14 0747 08/23/14 0807 08/24/14 0802 08/25/14 0814 08/26/14 0756  GLUCAP 128* 140* 128* 120* 147*    Recent Results (from the past 240 hour(s))  Culture, Urine     Status: None   Collection Time: 08/17/14 12:28 PM  Result Value Ref Range Status   Specimen Description URINE, RANDOM  Final   Special Requests NONE  Final   Culture  Setup Time   Final    08/17/2014 20:01 Performed at [place] Performed at [facility]   Final   Culture NO GROWTH Performed at [facility]   Final   Report Status 08/18/2014 FINAL  Final  Culture, blood (routine x 2)     Status: None (Preliminary result)   Collection Time: 08/22/14  6:20 PM  Result Value Ref Range Status   Specimen Description BLOOD RIGHT ARM  Final   Special Requests BOTTLES DRAWN AEROBIC AND ANAEROBIC 10CC  Final   Culture  Setup Time   Final    08/22/2014 23:05 Performed at [facility]    Culture   Final           BLOOD CULTURE RECEIVED NO GROWTH TO DATE CULTURE WILL BE HELD FOR 5 DAYS BEFORE ISSUING A FINAL NEGATIVE REPORT Note: Culture results may be compromised due to an excessive volume of blood received in culture bottles. Performed at [facility]    Report Status PENDING  Incomplete  Culture, blood (routine x 2)     Status: None (Preliminary result)   Collection Time: 08/22/14  6:30 PM  Result Value Ref Range Status   Specimen Description BLOOD LEFT HAND  Final   Special Requests BOTTLES DRAWN AEROBIC AND ANAEROBIC 10CC  Final   Culture  Setup Time   Final    08/22/2014 23:04 Performed at [facility]    Culture   Final           BLOOD CULTURE RECEIVED NO GROWTH TO DATE CULTURE WILL BE HELD FOR 5 DAYS BEFORE ISSUING A FINAL NEGATIVE  REPORT Note: Culture results may be compromised due to an excessive volume of blood received in culture bottles. Performed at [facility]  Report Status PENDING  Incomplete  AFB culture with smear     Status: None (Preliminary result)   Collection Time: 08/23/14 11:19 AM  Result Value Ref Range Status   Specimen Description PLEURAL RIGHT  Final   Special Requests NONE  Final   Acid Fast Smear   Final    NO ACID FAST BACILLI SEEN Performed at [facility]    Culture   Final    CULTURE WILL BE EXAMINED FOR 6 WEEKS BEFORE ISSUING A FINAL REPORT Performed at [facility]    Report Status PENDING  Incomplete  Body fluid culture     Status: None (Preliminary result)   Collection Time: 08/23/14 11:19 AM  Result Value Ref Range Status   Specimen Description PLEURAL RIGHT  Final   Special Requests NONE  Final   Gram Stain   Final    RARE WBC PRESENT, PREDOMINANTLY MONONUCLEAR NO ORGANISMS SEEN Performed at [facility]    Culture   Final    NO GROWTH 3 DAYS Performed at [facility]    Report Status PENDING  Incomplete  Fungus Culture with Smear     Status: None (Preliminary result)   Collection Time: 08/23/14 11:19 AM  Result Value Ref Range Status   Specimen Description PLEURAL RIGHT  Final   Special Requests NONE  Final   Fungal Smear   Final    NO YEAST OR FUNGAL ELEMENTS SEEN Performed at [facility]    Culture   Final    CULTURE IN PROGRESS FOR FOUR WEEKS Performed at [facility]    Report Status PENDING  Incomplete     Scheduled Meds: . antiseptic oral rinse  7 mL Mouth Rinse BID  . carvedilol  12.5 mg Oral BID WC  . diltiazem  120 mg Oral Q breakfast  . enoxaparin (LOVENOX) injection  1 mg/kg Subcutaneous Q24H  . famotidine  20 mg Oral Daily  . feeding supplement (ENSURE)  1 Container Oral BID BM  . feeding supplement (JEVITY 1.2 CAL)  1,000 mL Per Tube Q24H  . folic acid  1 mg Oral Q breakfast  .  imipenem-cilastatin  250 mg Intravenous Q8H  . isosorbide mononitrate  30 mg Oral Q breakfast  . omega-3 acid ethyl esters  1 g Oral Daily  . polyethylene glycol  17 g Oral QODAY  . polyvinyl alcohol  1 drop Both Eyes BID  . senna-docusate  1 tablet Oral BID  . sodium chloride  3 mL Intravenous Q12H  . triamcinolone cream   Topical UD   Continuous Infusions:

## 2014-08-26 NOTE — Progress Notes (Addendum)
Patient refused NG Tube placement. Patient states " I want to die. I do not want anything in my nose". Patient's daughter is at bedside. Nasogastric Tube not placed at this time. Daughter states "I want to make her comfortable." Attending Physician notified of events. Will continue to monitor.

## 2014-08-26 NOTE — Progress Notes (Signed)
CSW continuing to follow.  CSW reviewed chart and noted that pt has continued to require Bipap support.   Per RNCM, pt has been approved by [facility] and pt daughter, Izora Gala agreeable to pt transitioning to [facility] tomorrow.   CSW updated [place] [facility].   No further social work needs identified at this time as plan is for [facility] at discharge.  CSW signing off.   [name], MSW, [place] [employer] [phone]

## 2014-08-26 NOTE — Progress Notes (Addendum)
NUTRITION FOLLOW UP  Intervention:   -Initiate Jevity 1.2 @ 20 ml/hr via NGT and increase by 10 ml every 4 hours to goal rate of 40 ml/hr.  -Tube feeding regimen provides 1151 kcal (76% of needs), 53 grams of protein, and 774 ml of H2O.  -Liberalized diet to allow soups (daughter in agreement to thicken for nectar thick consistency) and baked potatoes w/out skin -Continue with Ensure pudding supplement regimen -RD to continue to monitor  Nutrition Dx:   Inadequate oral intake related to dementia as evidenced by poor po intake;ongoing  Goal:   TF + PO intake to meet >/= 90% of their estimated nutrition needs; progressing  Monitor:   Swallow profile, weight trend, po intake labs  Assessment:   12/07: - Pt reports that she feels hungry. Spoke with daughter who said that pt has had weight gain due to fluid retention. Weight was reported to be stable prior to admission. Pt normally has a good appetite. Pt does not like liquid supplements, she prefers puddings and ice creams. RD to order appropriate supplements. - Per RN, pt is awaiting swallow evaluation.  - No signs of fat or muscle wasting.   12/08: -SLP eval on 12/07 recd Dys 3 w/ground meats and nectar thick liquids -Received verbal consult from RN regarding pt dislike of current diet textures (dry foods, poor taste) -Discussed with SLP the addition of gravies, which SLP approved. RD modified diet in Epic and HealthTouch -Reviewed addition of nectar thick milk, gravies, salsa, melted cheese and brown sugar to food items to increase food acceptability  -Provided nutrition education handouts to daughter regarding Dys3 for additional information  12/14: -Pt continues with poor PO intake and deconditioning. Received verbal consult to initiate TF. NGT placement pending -Per discussion with pt's daughter, pt would enjoy softer foods that require minimal oral manipulation (soups, mashed potatoes, etc) -RD allowed soups with addition of  appropriate amount of thickener, and baked sweet potato (recommend not consume potato skin) -Consuming Ensure pudding supplement occasionally -Wt increased 6 lb since admit, likely r/t to fluid accumulation in bilateral lower extremities -CBG elevated but within goal of < 150 mg/dL  Height: Ht Readings from Last 1 Encounters:  08/16/14 5' 2.3" (1.582 m)    Weight Status:   Wt Readings from Last 1 Encounters:  08/26/14 151 lb 3.8 oz (68.6 kg)  08/20/14 145 lb  Re-estimated needs:  Kcal: 1500-1700 Protein: 80-90 g Fluid: 1.7 L/day  Skin: WDL; +2 RLE, +2 LLE edema  Diet Order: DIET DYS 3/ground meat/nectar thick   Intake/Output Summary (Last 24 hours) at 08/26/14 1151 Last data filed at 08/26/14 0600  Gross per 24 hour  Intake    703 ml  Output   1100 ml  Net   -397 ml    Last BM: 12/10; constipation, receiving docusate   Labs:   Recent Labs Lab 08/24/14 1158 08/25/14 0344 08/26/14 0335  NA 148* 145 145  K 4.5 4.7 5.1  CL 113* 111 111  CO2 24 24 25   BUN 57* 54* 45*  CREATININE 1.15* 1.20* 0.99  CALCIUM 8.7 8.8 8.8  GLUCOSE 127* 118* 143*    CBG (last 3)   Recent Labs  08/24/14 0802 08/25/14 0814 08/26/14 0756  GLUCAP 128* 120* 147*    Scheduled Meds: . antiseptic oral rinse  7 mL Mouth Rinse BID  . carvedilol  12.5 mg Oral BID WC  . diltiazem  120 mg Oral Q breakfast  . enoxaparin (LOVENOX) injection  1 mg/kg Subcutaneous Q24H  . famotidine  20 mg Oral Daily  . feeding supplement (ENSURE)  1 Container Oral BID BM  . folic acid  1 mg Oral Q breakfast  . imipenem-cilastatin  250 mg Intravenous Q8H  . isosorbide mononitrate  30 mg Oral Q breakfast  . omega-3 acid ethyl esters  1 g Oral Daily  . polyethylene glycol  17 g Oral QODAY  . polyvinyl alcohol  1 drop Both Eyes BID  . senna-docusate  1 tablet Oral BID  . sodium chloride  3 mL Intravenous Q12H  . triamcinolone cream   Topical UD    Continuous Infusions:   Atlee Abide MS RD  LDN Clinical Dietitian [phone]

## 2014-08-26 NOTE — Progress Notes (Signed)
Pt daughter requested for RT to place pt on BIPAP.  11:00 done.

## 2014-08-26 NOTE — Progress Notes (Signed)
PULMONARY / CRITICAL CARE MEDICINE   Name: ELLICE [name] MRN: [id number] DOB: [birth year]    ADMISSION DATE:  08/16/2014 CONSULTATION DATE:  12/8  REFERRING MD :  Doyle Askew   CHIEF COMPLAINT:  Acute respiratory failure HCAP   INITIAL PRESENTATION:  80 year old female who resides at a SNF w/ sig medical h/o CAF, PAD, mild dementia (but cognitive fxn fairly intact), prior AKA on right, and chronic diastolic HF Admitted on 38/8 from the SNF w/ RUL PNA. She was admitted to the SDU, placed on empiric abx and NIPPV.Culture data was positive for Klebsiella PNA. ID in 2/2 blood cultures. Infectious disease was consulted. She had made very slow progress clinically and radiographically. PCCM asked to see on 12/8 as she still was requiring NIPPV   STUDIES EVENTS CT chest 12/10: 1. Lobar consolidation of much of the right upper lobe compatible with acute pneumonia. There is a large parapneumonic right-sided pleural effusion associated with extensive passive atelectasis in the right lower lobe.2. Evidence of tracheobronchomalacia.3. Borderline enlarged and mildly enlarged right hilar and mediastinal lymph nodes are presumably reactive.4. 5 mm pulmonary nodule in the lateral segment of the right middle lobe, unchanged compared to prior study 08/23/14:  weak, back on BIPAP s/p thora with1243mL clear fluid on right siode, exudate with fluid LDH 258. Patient very deconditioned . Daughter feels no improvement after thora and frustrated  VITAL SIGNS: Temp:  [97.1 F (36.2 C)-98.9 F (37.2 C)] 97.1 F (36.2 C) (12/14 0800) Pulse Rate:  [66-92] 92 (12/14 0720) Resp:  [19-31] 31 (12/14 0720) BP: (128-197)/(62-84) 197/62 mmHg (12/14 0720) SpO2:  [93 %-98 %] 94 % (12/14 0720) FiO2 (%):  [35 %] 35 % (12/13 2025) Weight:  [68.6 kg (151 lb 3.8 oz)] 68.6 kg (151 lb 3.8 oz) (12/14 0400)  4 liters  HEMODYNAMICS:   VENTILATOR SETTINGS: Vent Mode:  [-]  FiO2 (%):  [35 %] 35 % INTAKE / OUTPUT:  Intake/Output Summary  (Last 24 hours) at 08/26/14 1116 Last data filed at 08/26/14 0600  Gross per 24 hour  Intake    703 ml  Output   1100 ml  Net   -397 ml    PHYSICAL EXAMINATION: General:  80 year old female, on bipap (refused). LOOKS VERY DECONDITIONED Neuro:  Awake, generalized weakness. Follows commands  Very weak. HEENT: [place], no JVD  Cardiovascular:  rrr Lungs:  Basilar rales,CTA decreased on left Abdomen:  Soft, non-tender  Musculoskeletal:  Intact  Skin:  Intact   LABS:  PULMONARY No results for input(s): PHART, PCO2ART, PO2ART, HCO3, TCO2, O2SAT in the last 168 hours.  Invalid input(s): PCO2, PO2  CBC  Recent Labs Lab 08/24/14 1158 08/25/14 0344 08/26/14 0335  HGB 9.1* 9.3* 9.5*  HCT 29.7* 29.7* 31.8*  WBC 26.5* 29.3* 27.0*  PLT 328 333 359    COAGULATION No results for input(s): INR in the last 168 hours.  CARDIAC  No results for input(s): TROPONINI in the last 168 hours. No results for input(s): PROBNP in the last 168 hours.   CHEMISTRY  Recent Labs Lab 08/22/14 0352 08/23/14 0330 08/24/14 1158 08/25/14 0344 08/26/14 0335  NA 148* 151* 148* 145 145  K 4.3 4.8 4.5 4.7 5.1  CL 111 114* 113* 111 111  CO2 25 26 24 24 25   GLUCOSE 113* 115* 127* 118* 143*  BUN 56* 58* 57* 54* 45*  CREATININE 1.20* 1.26* 1.15* 1.20* 0.99  CALCIUM 9.5 9.7 8.7 8.8 8.8   Estimated Creatinine Clearance: 32.5  mL/min (by C-G formula based on Cr of 0.99).   LIVER  Recent Labs Lab 08/23/14 0300  PROT 6.6     INFECTIOUS No results for input(s): LATICACIDVEN, PROCALCITON in the last 168 hours.   ENDOCRINE CBG (last 3)   Recent Labs  08/24/14 0802 08/25/14 0814 08/26/14 0756  GLUCAP 128* 120* 147*    IMAGING x48h No results found.   ASSESSMENT / PLAN:  PULMONARY OETT A:  acute respiratory failure in the setting of HCAP vs aspiration -slow progress  Large right pleural effusion - transudate s.p 1.2L removal 08/23/14 P:   Cont NIPPV PRN/ try mandatory HS and  PRN day mobilize Scheduled BDs Repeat cxr in am    CARDIOVASCULAR CVL A:  Sepsis in the setting of bacteremia  Chronic diastolic HF HTN PAF -brady on coreg & cardizem  P:  Cont tele monitoring  Keep euvolemic  Hold parameters on BB, CCB -resume labetalol if cannot take PO while on bipap  RENAL A:   AKI: scr had improved after holding lasix  Hypernatremia  P:   Per [facility]  GASTROINTESTINAL A:   Possible dysphagia PCM  P:   Aspiration precautions  PPI  Add tubefeeds  HEMATOLOGIC A:   H/o T cell lymphoma of multiple LNs w/ multiple sites  Anemia of chronic illness  Persistent leucocytosis P:  Trend cbc [place] LMWH PRBC for hgb < 7gm%  INFECTIOUS A:   Sepsis w/ klebsiella bacteremia, likely from UTI 11/21.  Aspiration vs HCAP  P:   BCx2  12/6: Klebsiella pneumoniae- R to ampi UC 12/5: neg, 11/21 klebsiella  maxipime 12/4>>>12/9 Imipenem 12/9 >> Defer to ID   ENDOCRINE A:   No acute  P:   Trend glucose   NEUROLOGIC A:  Acute encephalopathy  P:   RASS goal: -0 D/c'd sedating meds Supportive care     GLOBAL NP 1`10/25/13: Physical deconditioning biggest issue. Anticipate slow recovery with high risk for setback. Will add tubefeeds to try to meet nutritional goals. Mobilize and try to get day-night schedule on BIPAP more normal. Will see what CXR looks like in am. If no improvement over the week have shared w/ daughter that chances she may not survive the hospital stay are very high.  Erick Colace ACNP-BC [place] Pager # [phone] OR # [phone] if no answer   MD summary statement   08/26/2014 11:16 AM

## 2014-08-26 NOTE — Progress Notes (Signed)
[facility] for Infectious Disease    Date of Admission:  08/16/2014   Total days of antibiotics 11        Day 7 imipenem          ID: Sara [name] is a 80 y.o. female who resides at a SNF w/ sig medical h/o CAF, PAD, mild dementia (but cognitive fxn fairly intact), prior AKA on right, and chronic diastolic HF Admitted on 02/6 from the SNF w/ RUL PNA. She was admitted to the SDU, placed on empiric abx and NIPPV.Culture data was positive for Klebsiella PNA. ID in 2/2 blood cultures. Infectious disease was consulted. She had made very slow progress clinically and radiographically. 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 On bipap this morning  Medications:  . antiseptic oral rinse  7 mL Mouth Rinse BID  . carvedilol  12.5 mg Oral BID WC  . diltiazem  120 mg Oral Q breakfast  . enoxaparin (LOVENOX) injection  1 mg/kg Subcutaneous Q24H  . famotidine  20 mg Oral Daily  . feeding supplement (ENSURE)  1 Container Oral BID BM  . folic acid  1 mg Oral Q breakfast  . imipenem-cilastatin  250 mg Intravenous Q8H  . isosorbide mononitrate  30 mg  Oral Q breakfast  . omega-3 acid ethyl esters  1 g Oral Daily  . polyethylene glycol  17 g Oral QODAY  . polyvinyl alcohol  1 drop Both Eyes BID  . senna-docusate  1 tablet Oral BID  . sodium chloride  3 mL Intravenous Q12H  . triamcinolone cream   Topical UD    Objective: Vital signs in last 24 hours: Temp:  [97.1 F (36.2 C)-98.9 F (37.2 C)] 97.1 F (36.2 C) (12/14 0800) Pulse Rate:  [66-92] 92 (12/14 0720) Resp:  [19-31] 31 (12/14 0720) BP: (128-197)/(62-84) 197/62 mmHg (12/14 0720) SpO2:  [93 %-98 %] 94 % (12/14 0720) FiO2 (%):  [35 %] 35 % (12/13 2025) Weight:  [151 lb 3.8 oz (68.6 kg)] 151 lb 3.8 oz (68.6 kg) (12/14 0400)  Physical Exam  Constitutional:  Opens eyes to name. appears state age. Fatigue appearing.wearing bipap Cardiovascular: Normal rate, regular rhythm and normal heart sounds. Exam reveals no gallop and no friction rub.  No murmur heard.  Pulmonary/Chest: Effort normal and breath sounds normal. No respiratory distress.  has no wheezes.  Abdominal: Soft. Bowel sounds are normal.  exhibits no distension. There is no tenderness.  Skin: Skin is warm and dry. Flaking at baseline   Lab Results  Recent Labs  08/25/14 0344 08/26/14 0335  WBC 29.3* 27.0*  HGB 9.3* 9.5*  HCT 29.7* 31.8*  NA 145 145  K 4.7 5.1  CL 111 111  CO2 24 25  BUN 54* 45*  CREATININE 1.20* 0.99   Liver Panel No results for input(s): PROT, ALBUMIN, AST, ALT, ALKPHOS, BILITOT, BILIDIR, IBILI in the last 72 hours. Sedimentation Rate No results for input(s): ESRSEDRATE in the last 72 hours. C-Reactive Protein No results for input(s): CRP in the last 72 hours.  Microbiology:  12/10 blood cx NGTD 12/4 blood cx klebsiella 12/11 pleural effusion: ngtd  Studies/Results: 12/11 cxr: rul infiltrate  Assessment/Plan: HCAP pneumonia complicated with kleb bacteremia and large pleural effusion drained = currently on day 11 fo 14 of antibiotics. Continue with 3 addn days of imipenem.  Still requiring bipap intermittently to minimize work of breathing.   kleb bacteremia = treated with antibiotics x 2 wk, blood cx have cleared    [facility], [facility] for Infectious Diseases Cell: [phone] Pager: [phone]  08/26/2014, 9:59 AM

## 2014-08-26 NOTE — Progress Notes (Signed)
ANTICOAGULATION CONSULT NOTE - Follow Up  See previous note from Lindell Spar, PharmD for full details.  Anti-Xa level = 0.58 units/ml drawn ~3.5 hours after lovenox dose which essentially is at the low-end of goal.  Plan:  Increase Lovenox to 65mg  sq q12h  Continue to monitor renal function and adjust as needed  Peggyann Juba, PharmD, BCPS Pager: [phone] 08/26/2014 9:47 PM

## 2014-08-26 NOTE — Progress Notes (Addendum)
ANTIBIOTIC CONSULT NOTE - FOLLOW-UP  Pharmacy Consult for Primaxin Indication: aspiration PNA, bacteremia  Allergies  Allergen Reactions  . Chlorhexidine Dermatitis  . Evista [Raloxifene]     unknown  . Penicillins Other (See Comments)    unknown  . Triamterene Other (See Comments)    unknown  . Alendronate Sodium Rash    Patient Measurements: Height: 5' 2.3" (158.2 cm) Weight: 151 lb 3.8 oz (68.6 kg) IBW/kg (Calculated) : 50.79  Vital Signs: Temp: 98.4 F (36.9 C) (12/14 0400) Temp Source: Axillary (12/14 0400) BP: 197/62 mmHg (12/14 0720) Pulse Rate: 92 (12/14 0720) Intake/Output from previous day: 12/13 0701 - 12/14 0700 In: 803 [P.O.:600; I.V.:3; IV Piggyback:200] Out: 1100 [Urine:1100] Intake/Output from this shift:    Labs:  Recent Labs  08/24/14 1158 08/25/14 0344 08/26/14 0335  WBC 26.5* 29.3* 27.0*  HGB 9.1* 9.3* 9.5*  PLT 328 333 359  CREATININE 1.15* 1.20* 0.99   Estimated Creatinine Clearance: 32.5 mL/min (by C-G formula based on Cr of 0.99). No results for input(s): VANCOTROUGH, VANCOPEAK, VANCORANDOM, GENTTROUGH, GENTPEAK, GENTRANDOM, TOBRATROUGH, TOBRAPEAK, TOBRARND, AMIKACINPEAK, AMIKACINTROU, AMIKACIN in the last 72 hours.   Microbiology: Recent Results (from the past 720 hour(s))  Urine culture     Status: None   Collection Time: 08/03/14 12:10 AM  Result Value Ref Range Status   Specimen Description URINE, RANDOM  Final   Special Requests NONE  Final   Culture  Setup Time   Final    08/03/2014 14:56 Performed at [place]   Final    50,000 COLONIES/ML Performed at [facility]    Culture   Final    KLEBSIELLA PNEUMONIAE Performed at [facility]    Report Status 08/06/2014 FINAL  Final   Organism ID, Bacteria KLEBSIELLA PNEUMONIAE  Final      Susceptibility   Klebsiella pneumoniae - MIC*    AMPICILLIN >=32 RESISTANT Resistant     CEFAZOLIN <=4 SENSITIVE Sensitive     CEFTRIAXONE  <=1 SENSITIVE Sensitive     CIPROFLOXACIN <=0.25 SENSITIVE Sensitive     GENTAMICIN <=1 SENSITIVE Sensitive     LEVOFLOXACIN <=0.12 SENSITIVE Sensitive     NITROFURANTOIN 64 INTERMEDIATE Intermediate     TOBRAMYCIN <=1 SENSITIVE Sensitive     TRIMETH/SULFA <=20 SENSITIVE Sensitive     PIP/TAZO <=4 SENSITIVE Sensitive     * KLEBSIELLA PNEUMONIAE  Blood culture (routine x 2)     Status: None   Collection Time: 08/16/14 10:20 AM  Result Value Ref Range Status   Specimen Description BLOOD RIGHT ANTECUBITAL  Final   Special Requests BOTTLES DRAWN AEROBIC AND ANAEROBIC 5ML  Final   Culture  Setup Time   Final    08/16/2014 12:39 Performed at [facility]    Culture   Final    KLEBSIELLA PNEUMONIAE Note: Gram Stain Report Called to,Read Back By and Verified With: TEVET WOODS RN [id number] 08/16/14 BY EDMOJ Performed at [facility]    Report Status 08/18/2014 FINAL  Final   Organism ID, Bacteria KLEBSIELLA PNEUMONIAE  Final      Susceptibility   Klebsiella pneumoniae - MIC*    AMPICILLIN >=32 RESISTANT Resistant     AMPICILLIN/SULBACTAM 4 SENSITIVE Sensitive     CEFAZOLIN <=4 SENSITIVE Sensitive     CEFEPIME <=1 SENSITIVE Sensitive     CEFTAZIDIME <=1 SENSITIVE Sensitive     CEFTRIAXONE <=1 SENSITIVE Sensitive     CIPROFLOXACIN <=0.25 SENSITIVE Sensitive  GENTAMICIN <=1 SENSITIVE Sensitive     IMIPENEM <=0.25 SENSITIVE Sensitive     PIP/TAZO <=4 SENSITIVE Sensitive     TOBRAMYCIN <=1 SENSITIVE Sensitive     TRIMETH/SULFA <=20 SENSITIVE Sensitive     * KLEBSIELLA PNEUMONIAE  Blood culture (routine x 2)     Status: None   Collection Time: 08/16/14 10:25 AM  Result Value Ref Range Status   Specimen Description BLOOD LEFT ANTECUBITAL  Final   Special Requests BOTTLES DRAWN AEROBIC AND ANAEROBIC 3ML  Final   Culture  Setup Time   Final    08/16/2014 12:39 Performed at [facility]    Culture   Final    KLEBSIELLA PNEUMONIAE Note: SUSCEPTIBILITIES PERFORMED  ON PREVIOUS CULTURE WITHIN THE LAST 5 DAYS. Note: Gram Stain Report Called to,Read Back By and Verified With: TEVET WOODS RN [id number] Performed at [facility]    Report Status 08/18/2014 FINAL  Final  MRSA PCR Screening     Status: None   Collection Time: 08/16/14  2:10 PM  Result Value Ref Range Status   MRSA by PCR NEGATIVE NEGATIVE Final    Comment:        The GeneXpert MRSA Assay (FDA approved for NASAL specimens only), is one component of a comprehensive MRSA colonization surveillance program. It is not intended to diagnose MRSA infection nor to guide or monitor treatment for MRSA infections.   Culture, Urine     Status: None   Collection Time: 08/17/14 12:28 PM  Result Value Ref Range Status   Specimen Description URINE, RANDOM  Final   Special Requests NONE  Final   Culture  Setup Time   Final    08/17/2014 20:01 Performed at [place] Performed at [facility]   Final   Culture NO GROWTH Performed at [facility]   Final   Report Status 08/18/2014 FINAL  Final  Culture, blood (routine x 2)     Status: None (Preliminary result)   Collection Time: 08/22/14  6:20 PM  Result Value Ref Range Status   Specimen Description BLOOD RIGHT ARM  Final   Special Requests BOTTLES DRAWN AEROBIC AND ANAEROBIC 10CC  Final   Culture  Setup Time   Final    08/22/2014 23:05 Performed at [facility]    Culture   Final           BLOOD CULTURE RECEIVED NO GROWTH TO DATE CULTURE WILL BE HELD FOR 5 DAYS BEFORE ISSUING A FINAL NEGATIVE REPORT Note: Culture results may be compromised due to an excessive volume of blood received in culture bottles. Performed at [facility]    Report Status PENDING  Incomplete  Culture, blood (routine x 2)     Status: None (Preliminary result)   Collection Time: 08/22/14  6:30 PM  Result Value Ref Range Status   Specimen Description BLOOD LEFT HAND  Final   Special Requests  BOTTLES DRAWN AEROBIC AND ANAEROBIC 10CC  Final   Culture  Setup Time   Final    08/22/2014 23:04 Performed at [facility]    Culture   Final           BLOOD CULTURE RECEIVED NO GROWTH TO DATE CULTURE WILL BE HELD FOR 5 DAYS BEFORE ISSUING A FINAL NEGATIVE REPORT Note: Culture results may be compromised due to an excessive volume of blood received in culture bottles. Performed at [facility]  Report Status PENDING  Incomplete  AFB culture with smear     Status: None (Preliminary result)   Collection Time: 08/23/14 11:19 AM  Result Value Ref Range Status   Specimen Description PLEURAL RIGHT  Final   Special Requests NONE  Final   Acid Fast Smear   Final    NO ACID FAST BACILLI SEEN Performed at [facility]    Culture   Final    CULTURE WILL BE EXAMINED FOR 6 WEEKS BEFORE ISSUING A FINAL REPORT Performed at [facility]    Report Status PENDING  Incomplete  Body fluid culture     Status: None (Preliminary result)   Collection Time: 08/23/14 11:19 AM  Result Value Ref Range Status   Specimen Description PLEURAL RIGHT  Final   Special Requests NONE  Final   Gram Stain   Final    RARE WBC PRESENT, PREDOMINANTLY MONONUCLEAR NO ORGANISMS SEEN Performed at [facility]    Culture   Final    NO GROWTH 2 DAYS Performed at [facility]    Report Status PENDING  Incomplete  Fungus Culture with Smear     Status: None (Preliminary result)   Collection Time: 08/23/14 11:19 AM  Result Value Ref Range Status   Specimen Description PLEURAL RIGHT  Final   Special Requests NONE  Final   Fungal Smear   Final    NO YEAST OR FUNGAL ELEMENTS SEEN Performed at [facility]    Culture   Final    CULTURE IN PROGRESS FOR FOUR WEEKS Performed at [facility]    Report Status PENDING  Incomplete    Medical History: Past Medical History  Diagnosis Date  . Hypertension   . Atrial fibrillation   . Psoriasis   .  Congestive heart failure (CHF)   . History of CVA (cerebrovascular accident)   . Coronary artery disease   . Myocardial infarction   . CHF (congestive heart failure)   . Shortness of breath   . H/O hiatal hernia   . Closed fracture of head of left humerus 5/12  . Basal cell carcinoma   . 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  Flow cytometry 04/04/12: 91% cells CD4 positive CD 26 negative  . Hyperlipemia   . Pneumonia   . Wound of right leg 09/25/2012    Necrotic, open wound right lower leg; non healing  . Stroke   . Hyposmolality and/or hyponatremia   . Lower limb amputation, above knee 10/11/2012  . Pain in joint, multiple sites   . Mycosis fungoides, unspecified site, extranodal and solid organ sites 09/28/2012  . Occlusion and stenosis of carotid artery without mention of cerebral infarction 09/28/2012  . Chronic venous hypertension with ulcer and inflammation 09/28/2012  . Diverticulosis of colon (without mention of hemorrhage) 09/28/2012  . Synovial cyst of popliteal space 03/26/2012  . Unspecified constipation 03/24/2012  . Hypopotassemia 01/26/2012  . Debility, unspecified 01/17/2012  . Anxiety state, unspecified 12/31/2011  . Anemia, unspecified 12/21/2011  . Other specified disease of white blood cells 12/21/2011  . Peripheral vascular disease, unspecified 12/21/2011    recent ABI Lt of 0.64 down from ).80 on 12/29/11- though different labs  . Reflux esophagitis 12/21/2011  . Unspecified hereditary and idiopathic peripheral neuropathy 12/14/2011  . Pain in joint, ankle and foot 12/14/2011  .  Carotid stenosis     CAROTID DOPPLER, 05/02/2012 - LEFT VERTEBRAL-occluded, LEFT BULB AND PROXIMAL ICA STENT-moderate amount of irregular mixed dense plaque 50-69% diameter reduction  . Pre-syncope     NUCLEAR STRESS TEST, 12/10/2009 - normal, EKG  negative for ischemia  . TIA (transient ischemic attack)     2D ECHO, 12/05/2012 - EF 60-65%, Severely calcified annulus of the mitral valve with mild-moderate regurgitation, LA moderate-severely dilated  . Paroxysmal atrial fibrillation   . CAD in native artery     3 vessel CAD, medical therapy, not a candidate for CABG  . S/P AKA (above knee amputation) unilateral, Rt leg due to PAD and non healing wound 06/27/2013  . HCAP (healthcare-associated pneumonia) 01/08/2012  . 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 Patient with hx chronic diastolic CHF, s/p right AKA, peripheral T cell lymphoma, PAF, HTN, GERD. Patient initially started on Cefepime and Vancomycin per pharmacy for rule out sepsis. Note unknown allergy to PCN but patient has tolerated cefepime in past per records.  Antibiotics adjusted to Primaxin on 12/8 per ID to cover aspiration pneumonia and Klebsiella bacteremia, with pharmacy to assist with dosing.  12/4 >> Vancomycin >> 12/7 12/4 >> Cefepime >> 12/8 12/8 >> Primaxin >>  Tmax: 98.9 F WBCs: remains elevated, 27 Renal: SCr improved to 0.99, est CrCl 39 ml/min normalized  12/4 blood x2: Klebsiella - resistant only to ampicillin 12/4 urine: NGF 12/4 flu: negative 12/4 Strep pneumo urinary antigen: negative 12/4 Legionella antigen, urine: negative 12/4 MRSA PCR: negative 12/10 blood x 2: NGTD 12/11 R pleural fluid: no growth x 2 days, AFB and fungus cx in progress   Goal of Therapy:  Appropriate antibiotic dosing for renal function and indication Eradication of infection  Plan:   Continue Primaxin 250 mg IV q8h for CrCl N 21-40 ml/min and wt of 68.6 kg.  Continue to monitor renal function, cultures, clinical progress.   Lindell Spar, PharmD, BCPS Pager: [phone] 08/26/2014 9:32 AM

## 2014-08-26 NOTE — Plan of Care (Signed)
Problem: Phase III Progression Outcomes Goal: Tolerating diet Outcome: Not Progressing Pt needs encouragement

## 2014-08-26 NOTE — Care Management Note (Signed)
CARE MANAGEMENT NOTE 08/26/2014  Patient:  Sara [name],Sara [name]   Account Number:  [id number]  Date Initiated:  08/21/2014  Documentation initiated by:  DAVIS,RHONDA  Subjective/Objective Assessment:   resp.failure/bipap     Action/Plan:   From [facility]   Anticipated DC Date:  08/17/2014   Anticipated DC Plan:  ACUTE TO ACUTE TRANS  In-house referral  Clinical Social Worker  Hospice / [place]  CM consult      [facility] Choice  LONG TERM ACUTE CARE   Choice offered to / List presented to:  C-4 Adult Children      DME agency  NA     [place] arranged  NA      [place] agency  NA   Status of service:  In process, will continue to follow Medicare Important Message given?   (If response is "NO", the following Medicare IM given date fields will be blank) Date Medicare IM given:   Medicare IM given by:   Date Additional Medicare IM given:   Additional Medicare IM given by:    Discharge Disposition:    Per UR Regulation:  Reviewed for med. necessity/level of care/duration of stay  If discussed at [place] of Stay Meetings, dates discussed:    Comments:  08/26/14 Marney Doctor RN,BSN,NCM I was alerted by [facility] rep that they had [name] bed available for pt today.  Unsure if [facility] was alerted of pt to evaluate.  Pat from [facility] called to have them evaluate pt. Spoke with MD about LTAC and she states pt would be appropriate to go tomorrow.  Spoke with Daughter Sara [name] at bedside about LTAC and she agreed to pt going there prior to pt going back to SNF.  Daughter just would like to have MD assess pt in the morning and have the chest xray prior to going. Daughter offered choice of LTAC and she chose [facility]. [facility] to call later this afternoon with bed assignment.  CM will continue to follow and assist as needed.  [id number] Davis,RN,BSN,CCM

## 2014-08-27 ENCOUNTER — Inpatient Hospital Stay
Admission: AD | Admit: 2014-08-27 | Discharge: 2014-09-13 | Disposition: E | Payer: Self-pay | Source: Ambulatory Visit | Attending: Internal Medicine | Admitting: Internal Medicine

## 2014-08-27 ENCOUNTER — Other Ambulatory Visit: Payer: Self-pay

## 2014-08-27 ENCOUNTER — Inpatient Hospital Stay (HOSPITAL_COMMUNITY): Payer: Medicare Other

## 2014-08-27 DIAGNOSIS — Z7189 Other specified counseling: Secondary | ICD-10-CM

## 2014-08-27 DIAGNOSIS — Z931 Gastrostomy status: Secondary | ICD-10-CM

## 2014-08-27 DIAGNOSIS — Z515 Encounter for palliative care: Secondary | ICD-10-CM

## 2014-08-27 DIAGNOSIS — J189 Pneumonia, unspecified organism: Secondary | ICD-10-CM

## 2014-08-27 DIAGNOSIS — R627 Adult failure to thrive: Secondary | ICD-10-CM

## 2014-08-27 LAB — BODY FLUID CULTURE: CULTURE: NO GROWTH

## 2014-08-27 LAB — GLUCOSE, CAPILLARY: GLUCOSE-CAPILLARY: 106 mg/dL — AB (ref 70–99)

## 2014-08-27 MED ORDER — SODIUM CHLORIDE 0.9 % IV SOLN: 250.0000 mg | Freq: Three times a day (TID) | INTRAVENOUS | Status: AC

## 2014-08-27 NOTE — Consult Note (Signed)
Patient WJ:XBJYN [name]      DOB: 03-Mar-1919      Sara [name]     Consult Note from the Palliative Medicine Team at [facility] Requested by: Dr Sara [name]     PCP: Sara Dooms, MD Reason for Consultation:Clarification of [place] and options     Phone Number:[phone]  Assessment of patients Current state:  Continued physical, functional and cognitive decline 2/2 to multiple co-morbidities; sepsis, HCAP, CHF, h/o lymphoma, CVA/ 2013, PAD/s/p AKA 2012, functional quadriplegia, overall failure to thrive.  Patient and family are faced with advanced directive decisions and anticipatory care needs.  Consult is for review of medical treatment options, clarification of goals of care and end of life issues, disposition and options, and symptom recommendation.  This NP Sara [name] reviewed medical records, received report from team, assessed the patient and then meet at the patient's bedside along with her two daughters Sara [name] and Sara [name] and then by telephone with Sara [name]/HPOA # [phone]  to discuss diagnosis prognosis, GOC, EOL wishes disposition and options.  A detailed discussion was had today regarding advanced directives.  Concepts specific to code status, artifical feeding and hydration, continued IV antibiotics and rehospitalization was had.  The difference between a aggressive medical intervention path  and a palliative comfort care path for this patient at this time was had.  Values and goals of care important to patient and family were attempted to be elicited.  Concept of Hospice and Palliative Care were discussed  Natural trajectory and expectations at EOL were discussed.  Questions and concerns addressed.  Hard Choices booklet left for review. Family encouraged to call with questions or concerns.  PMT will continue to support holistically.   Goals of Care: 1.  Code Status:   DNR/DNI   2. Scope of Treatment: Family hopes to continue to treat the treatable for "a  few more days", they are hopeful for improvement.  They are open for continued care at [facility], transition is today.  Decision for full DNR/DNI implemented  3. Disposition:  Transition to [facility] today, continue to re-evaluate medical situation and further decisions dependant on outcomes   4. Symptom Management:   1.  Weakness: Continue medical management  of acute/chronic disease   5. Psychosocial:  Emotional support offered to patient and daughters at bedside.  The difficult decision and "knowing" when it is time to shift from aggressive medical interventions to a comfort approach has been  stressful for the family.  All are comfortable with the decisions to give it "more time" at [facility]    Patient Documents Completed or Given: Document Given Completed  Advanced Directives Pkt    MOST X   DNR    Gone from My Sight    Hard Choices X     Brief HPI:  80 year old female with past medical history of PAD status post stenting (LICA 84/1324), right AKA, paroxysmal atrial fibrillation on AC with pradaxa,dementia, hypertension, chronic diastolic CHF (2 D ECHO in 08/2014 with EF of 55%, wheelchair bound who presented from [place] to [facility] ED with reports of not feeling very well and reports of fevers. She is not a very good historian due to dementia. Apparently no respiratory distress but on admission patient was found to have O2 saturation of 86%. No other complaints.  In ED, BP was 71/42 to 141/49, HR 59-91, RR 25 and T max 102 F. Oxygen saturation was 86% on room air, improved to 92% with [place]  oxygen support. CXR consistent with right upper lung lobe pneumonia. She was started on broad spectrum abx, cefepime and vanco for sepsis and pneumonia  Patient has progressed very slowly, notable decline and failure to thrive in clinical condition demonstrated during the hospital stay. It was determined patient would benefit from [facility] specialty care placement, family in agreement  ROS: unable to  illicit due to present lethargy   PMH:  Past Medical History  Diagnosis Date  . Hypertension   . Atrial fibrillation   . Psoriasis   . Congestive heart failure (CHF)   . History of CVA (cerebrovascular accident)   . Coronary artery disease   . Myocardial infarction   . CHF (congestive heart failure)   . Shortness of breath   . H/O hiatal hernia   . Closed fracture of head of left humerus 5/12  . Basal cell carcinoma   . 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  Flow cytometry 04/04/12: 91% cells CD4 positive CD 26 negative  . Hyperlipemia   . Pneumonia   . Wound of right leg 09/25/2012    Necrotic, open wound right lower leg; non healing  . Stroke   . Hyposmolality and/or hyponatremia   . Lower limb amputation, above knee 10/11/2012  . Pain in joint, multiple sites   . Mycosis fungoides, unspecified site, extranodal and solid organ sites 09/28/2012  . Occlusion and stenosis of carotid artery without mention of cerebral infarction 09/28/2012  . Chronic venous hypertension with ulcer and inflammation 09/28/2012  . Diverticulosis of colon (without mention of hemorrhage) 09/28/2012  . Synovial cyst of popliteal space 03/26/2012  . Unspecified constipation 03/24/2012  . Hypopotassemia 01/26/2012  . Debility, unspecified 01/17/2012  . Anxiety state, unspecified 12/31/2011  . Anemia, unspecified 12/21/2011  . Other specified disease of white blood cells 12/21/2011  . Peripheral vascular disease, unspecified 12/21/2011    recent ABI Lt of 0.64 down from ).80 on 12/29/11- though different labs  . Reflux esophagitis 12/21/2011  . Unspecified hereditary and idiopathic peripheral neuropathy 12/14/2011  . Pain in joint, ankle and foot 12/14/2011  . Carotid stenosis     CAROTID DOPPLER, 05/02/2012 - LEFT VERTEBRAL-occluded, LEFT BULB AND PROXIMAL ICA  STENT-moderate amount of irregular mixed dense plaque 50-69% diameter reduction  . Pre-syncope     NUCLEAR STRESS TEST, 12/10/2009 - normal, EKG negative for ischemia  . TIA (transient ischemic attack)     2D ECHO, 12/05/2012 - EF 60-65%, Severely calcified annulus of the mitral valve with mild-moderate regurgitation, LA moderate-severely dilated  . Paroxysmal atrial fibrillation   . CAD in native artery     3 vessel CAD, medical therapy, not a candidate for CABG  . S/P AKA (above knee amputation) unilateral, Rt leg due to PAD and non healing wound 06/27/2013  . HCAP (healthcare-associated pneumonia) 01/08/2012  . 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 Tonsillectomy    . Dilation and curettage of uterus    . Eye surgery    . Cataracts    . Vertebroplasty    . Amputation  10/10/2012    Procedure: AMPUTATION ABOVE KNEE;  Surgeon: Newt Minion, MD;  Location: [facility];  Service: Orthopedics;  Laterality: Right;  .  Percutaneous stent intervention  04/13/2011    Left internal carotid artery stented with a 8x30 prcise nitinol self-expanding stent resulting reduction of 90% to less than 20% residual  . Cardiac catheterization  11/06/2010    CABG recommended, turned down by [facility] secondary to co morbidities   I have reviewed the FH and SH and  If appropriate update it with new information. Allergies  Allergen Reactions  . Chlorhexidine Dermatitis  . Evista [Raloxifene]     unknown  . Penicillins Other (See Comments)    unknown  . Triamterene Other (See Comments)    unknown  . Alendronate Sodium Rash   Scheduled Meds: . antiseptic oral rinse  7 mL Mouth Rinse BID  . carvedilol  12.5 mg Oral BID WC  . diltiazem  120 mg Oral Q breakfast  . enoxaparin (LOVENOX) injection  1 mg/kg Subcutaneous Q12H  . famotidine  20 mg Oral Daily  . feeding supplement  (ENSURE)  1 Container Oral BID BM  . feeding supplement (JEVITY 1.2 CAL)  1,000 mL Per Tube Q24H  . folic acid  1 mg Oral Q breakfast  . imipenem-cilastatin  250 mg Intravenous Q8H  . isosorbide mononitrate  30 mg Oral Q breakfast  . omega-3 acid ethyl esters  1 g Oral Daily  . polyethylene glycol  17 g Oral QODAY  . polyvinyl alcohol  1 drop Both Eyes BID  . senna-docusate  1 tablet Oral BID  . sodium chloride  3 mL Intravenous Q12H  . triamcinolone cream   Topical UD   Continuous Infusions: . sodium chloride 10 mL/hr at 08/26/14 2104   PRN Meds:.acetaminophen, labetalol, levalbuterol, ondansetron **OR** ondansetron (ZOFRAN) IV, RESOURCE THICKENUP CLEAR    BP 138/57 mmHg  Pulse 92  Temp(Src) 98.3 F (36.8 C) (Axillary)  Resp 22  Ht 5' 2.3" (1.582 m)  Wt 68 kg (149 lb 14.6 oz)  BMI 27.17 kg/m2  SpO2 94%   PPS: 30 % at best   Intake/Output Summary (Last 24 hours) at 08/15/2014 1000 Last data filed at 09/07/2014 0932  Gross per 24 hour  Intake 409.33 ml  Output    790 ml  Net -380.67 ml     Physical Exam:  General: chronically ill appearing, weak, frail elderly female HEENT: moist buccal membranes Chest:   Decreased in bases CVS: RRR Ext: noted RAKA, no edema LLE Neuro: oriented to person and place  Labs: CBC    Component Value Date/Time   WBC 27.0* 08/26/2014 0335   WBC 24.2 08/09/2014   WBC 24.6* 05/30/2014 1308   RBC 3.19* 08/26/2014 0335   RBC 4.39 05/30/2014 1308   RBC 3.54* 12/08/2011 0605   HGB 9.5* 08/26/2014 0335   HGB 13.2 05/30/2014 1308   HCT 31.8* 08/26/2014 0335   HCT 40.2 05/30/2014 1308   PLT 359 08/26/2014 0335   PLT 250 05/30/2014 1308   MCV 99.7 08/26/2014 0335   MCV 91.6 05/30/2014 1308   MCH 29.8 08/26/2014 0335   MCH 30.1 05/30/2014 1308   MCHC 29.9* 08/26/2014 0335   MCHC 32.8 05/30/2014 1308   RDW 13.8 08/26/2014 0335   RDW 13.2 05/30/2014 1308   LYMPHSABS 9.6* 08/23/2014 0330   LYMPHSABS 17.0* 05/30/2014 1308   MONOABS  2.0* 08/23/2014 0330   MONOABS 0.5 05/30/2014 1308   EOSABS 0.3 08/23/2014 0330   EOSABS 0.1 05/30/2014 1308   BASOSABS 0.0 08/23/2014 0330   BASOSABS 0.1 05/30/2014 1308    BMET    Component Value  Date/Time   NA 145 08/26/2014 0335   NA 139 08/09/2014   NA 140 02/27/2014 1049   K 5.1 08/26/2014 0335   K 3.9 02/27/2014 1049   CL 111 08/26/2014 0335   CL 102 09/25/2012 1002   CO2 25 08/26/2014 0335   CO2 31* 02/27/2014 1049   GLUCOSE 143* 08/26/2014 0335   GLUCOSE 161* 02/27/2014 1049   GLUCOSE 145* 09/25/2012 1002   BUN 45* 08/26/2014 0335   BUN 16 08/09/2014   BUN 15.9 02/27/2014 1049   CREATININE 0.99 08/26/2014 0335   CREATININE 0.8 08/09/2014   CREATININE 0.9 02/27/2014 1049   CREATININE 0.56 01/23/2013 0720   CALCIUM 8.8 08/26/2014 0335   CALCIUM 9.7 02/27/2014 1049   GFRNONAA 48* 08/26/2014 0335   GFRAA 55* 08/26/2014 0335    CMP     Component Value Date/Time   NA 145 08/26/2014 0335   NA 139 08/09/2014   NA 140 02/27/2014 1049   K 5.1 08/26/2014 0335   K 3.9 02/27/2014 1049   CL 111 08/26/2014 0335   CL 102 09/25/2012 1002   CO2 25 08/26/2014 0335   CO2 31* 02/27/2014 1049   GLUCOSE 143* 08/26/2014 0335   GLUCOSE 161* 02/27/2014 1049   GLUCOSE 145* 09/25/2012 1002   BUN 45* 08/26/2014 0335   BUN 16 08/09/2014   BUN 15.9 02/27/2014 1049   CREATININE 0.99 08/26/2014 0335   CREATININE 0.8 08/09/2014   CREATININE 0.9 02/27/2014 1049   CREATININE 0.56 01/23/2013 0720   CALCIUM 8.8 08/26/2014 0335   CALCIUM 9.7 02/27/2014 1049   PROT 6.6 08/23/2014 0300   PROT 7.2 02/27/2014 1049   ALBUMIN 2.5* 08/17/2014 0350   ALBUMIN 3.5 02/27/2014 1049   AST 20 08/17/2014 0350   AST 16 02/27/2014 1049   ALT 10 08/17/2014 0350   ALT 9 02/27/2014 1049   ALKPHOS 35* 08/17/2014 0350   ALKPHOS 72 02/27/2014 1049   BILITOT 0.6 08/17/2014 0350   BILITOT 0.32 02/27/2014 1049   GFRNONAA 48* 08/26/2014 0335   GFRAA 55* 08/26/2014 0335     Time In Time Out Total  Time Spent with Patient Total Overall Time  0945  1115 80 min 90 min    Greater than 50%  of this time was spent counseling and coordinating care related to the above assessment and plan.   Sara Lessen NP  Palliative Medicine Team Team Phone # [phone] Pager [phone]  Discussed with Sara Griffon NP and Dr Sara [name]

## 2014-08-27 NOTE — Progress Notes (Addendum)
Report called to nurse at [facility]. Patient was stable at transfer via Karen Chafe, Daughters at bedside.

## 2014-08-27 NOTE — Care Management Note (Addendum)
    Page 1 of 2   09/01/2014     12:58:34 PM CARE MANAGEMENT NOTE 08/28/2014  Patient:  Zorn,Loralee A   Account Number:  [id number]  Date Initiated:  08/21/2014  Documentation initiated by:  DAVIS,RHONDA  Subjective/Objective Assessment:   resp.failure/bipap     Action/Plan:   From [facility]   Anticipated DC Date:  09/04/2014   Anticipated DC Plan:  ACUTE TO ACUTE TRANS  In-house referral  Clinical Social Worker  Hospice / [facility]  CM consult      [facility] Choice  LONG TERM ACUTE CARE   Choice offered to / List presented to:  C-4 Adult Children      DME agency  NA     [place] arranged  NA      [place] agency  NA   Status of service:  Completed, signed off Medicare Important Message given?   (If response is "NO", the following Medicare IM given date fields will be blank) Date Medicare IM given:   Medicare IM given by:   Date Additional Medicare IM given:   Additional Medicare IM given by:    Discharge Disposition:  LONG TERM ACUTE CARE (LTAC)  Per UR Regulation:  Reviewed for med. necessity/level of care/duration of stay  If discussed at [place] of Stay Meetings, dates discussed:    Comments:  09/09/2014 12:50 CM received call from RN as pt and family has decided to go to [facility].  Tommy of [facility] notified.  CM gave RN room number at [facility] with Laren Everts, MD receiving pt.  Per request of pt and family, [facility] called to inform of LTAC admission (spoke with Freda Munro).  No other CM needs were communicated.  Mariane Masters, BSN, [place].  12/15 11:00 CM met with family and Palliative Consult and notified them [facility] will still accept pt with or without NG tube (for feeding) CM spoke with [facility] (Olivia/Lydia) to confirm acceptance. Family is discussing options with grandson, Saralyn Pilar who is MPOA.  Will continue to monitor for family decision  Mariane Masters, [place], [place].   08/26/14 Marney Doctor RN,BSN,NCM I was alerted by  [facility] rep that they had a bed available for pt today.  Unsure if [facility] was alerted of pt to evaluate.  Pat from [facility] called to have them evaluate pt. Spoke with MD about LTAC and she states pt would be appropriate to go tomorrow.  Spoke with Daughter Izora Gala at bedside about LTAC and she agreed to pt going there prior to pt going back to SNF.  Daughter just would like to have MD assess pt in the morning and have the chest xray prior to going. Daughter offered choice of LTAC and [facility]. [facility] to call later this afternoon with bed assignment.  CM will continue to follow and assist as needed.  [id number] Davis,RN,BSN,CCM

## 2014-08-27 NOTE — Discharge Instructions (Signed)

## 2014-08-27 NOTE — Discharge Summary (Signed)
Physician Discharge Summary  Sara [name] VVO:[id number] DOB: 1918-12-14 DOA: 08/16/2014  PCP: Sara Dooms, MD  Admit date: 08/16/2014 Discharge date: 09/02/2014  Recommendations for Outpatient Follow-up:  1. [facility] 2. Please note that patient needs to be on Primaxin until 08/29/2014 which will complete total of 2 weeks therapy with Primaxin for Klebsiella pneumonia bacteremia 3. 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   ARF (acute renal failure)   Anemia of chronic disease   CAP (community acquired pneumonia)   Acute renal failure syndrome   Dyspnea   Healthcare-associated pneumonia   Bacteremia due to Klebsiella pneumoniae   Acute respiratory failure   HCAP (healthcare-associated pneumonia)   Pleural effusion   Discharge Condition: Guarded  Diet recommendation: Comfort feeding as patient able to tolerate   Brief narrative: 80 year old female with past medical history of PAD status post stenting (LICA 62/7035), right AKA, paroxysmal atrial fibrillation on AC with pradaxa,dementia, hypertension, chronic diastolic CHF (2 D ECHO in 08/2014 with EF of 55%, wheelchair bound who presented from [place] to [facility] ED with reports of not feeling very well and reports of fevers. She is not a very good historian due to dementia. Apparently no respiratory distress but on admission patient was found to have O2  saturation of 86%. No other complaints.  In ED, BP was 71/42 to 141/49, HR 59-91, RR 25 and T max 102 F. Oxygen saturation was 86% on room air, improved to 92% with  oxygen support. CXR consistent with right upper lung lobe pneumonia. She was started on broad spectrum abx, cefepime and vanco for sepsis and pneumonia  Patient has progressed very slowly, notable decline and failure to thrive in clinical condition demonstrated during the hospital stay. It was determined [facility] would benefit [facility], family in agreement.  Assessment and Plan:   Principal Problem: Severe sepsis / Leukocytosis / HCAP  - sepsis criteria met with initial vitals hypotension, tachypnea, bradycardia, T max 102 F, leukocytosis, evidence of end organ damange which includes hypoxia and ARF, evidence of PNA on CXR. - blood cultures positive for Klebsiella (source possible from recent UTI, urine culture from 11/26 positive for Klebsiella)  - started broad spectrum antibiotics Vancomycin and Maxipime 12/4. Vanc d/c per ID 12/07, maxipime d/c 12/08 - ABX changed to Primaxin to cover for aspiration PNA 12/08, continue taking until 08/29/2014 which will complete 14 days therapy - Ct Chest with large parapneumonic right-sided pleural effusion a/w extensive passive atelectasis RLL - s/p right sided thoracentesis by PCCM (12/11) - pt with very slow clinical improvement, tired, refusing care at times  Active Problems: Chronic diastolic HF (heart failure) - last BNP in 12/2013 with BNP in 300-400 range. 2 D ECHO in 08/2013 showed EF 55%. - weight from admission: 136 lbs --> 151 lbs --> 143 lbs --> 144 --> 151 --> 149 lbs this AM - stopped IVF  12/06, restarted D5 12/12, will stop IVF 12/13 as weight is trending up and pt with more upper extremity edema  - pt started on Lasix 20 mg IV BID 12/6, holding Lasix since 12/08 - continue to monitor daily weights, strict I's and O's - May  provide Lasix if needed, based on weight, volume status, blood pressure Hypernatremia - Na is WNL this AM, stopped IVF 12/13 due to worsening upper extremity edema  HTN (hypertension), accelerated  - still difficult to control and labile - resumed Coreg 12. 5 mg PO BID and Cardizem 12/06 but pt has not been taking much PO  - resumed Imdur 12/07 - continue to hold Lisinopril  Leukocytosis - pt with known Lymphoma - appreciate Dr. 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 - hold targretin in the setting of an acute infection, sepsis ARF (acute renal failure)  - continue to hold lisinopril - Cr now WNL Anemia of chronic disease - secondary to peripheral T cell lymphoma and chemotherapy  - hemoglobin is 11 on admission and slightly down since admission - no signs of active bleeding  - no current indications for transfusion  GERD (gastroesophageal reflux disease) - continue Pepcid 40 mg daily  Hypokalemia - supplemented and WNL this AM Severe PCM - in the context of acute illness - aspiration precautions  Functional quadriplegia - bed bound since admission - progressive FTT and deconditioning  - Palliative care team consulted Left LE wounds, PAD - Full thickness tissue loss on LLE, pretibial area, dried eschar on left LE lateral aspect. - Pretibial wound presents as a partially avulsed area (skin tear) measuring 3cm x 2cm x 0.2cm - eschar on the LLE (lateral aspect) measures  3.5cm x 2.5cm. - clean with betadine solution twice daily and cover with a dry dressing when it has air-dried  DVT prophylaxis:  - enoxaparin subQ since admission   Code Status: Partial, no intubation  Family Communication: no family at bedside  Disposition Plan: [facility] to [facility]  IV Access:    Peripheral IV Procedures and diagnostic studies:    CXR 08/21/2014 Stable dense right upper lobe pneumonia associated with progressive volume loss over the past 5 days. A possible right hilar or perihilar mass is now all better visible due to the progressive right upper lobe volume loss. CT of the chest with contrast and possibly bronchoscopy may be helpful in further evaluation. Stable small bilateral pleural effusions. Stable passive atelectasis versus pneumonia in the left lower lobe. Increasing patchy pneumonia in the right lower lobe  CXR 08/19/2014 Persistent RUL airspace disease, ? central obstructing lesion. small pleural effusions, atelectasis.   CXR12/01/2014 Persistent dense consolidative right upper lobe pneumonia. Background COPD/ emphysema.   CXR12/12/2013 Increasing right upper lobe pneumonia.   CXR 08/16/2014 Right upper lobe pneumonia. Post treatment radiographs recommended to document resolution.  Medical Consultants:    PCCM  ID Other Consultants:    Physical therapy  Anti-Infectives:    Vancomycin 12/04 --> 12/07  Maxipime 12/04 --> 12/08  Primaxin 12/08 --> 12/17   Discharge Exam: Filed Vitals:   08/24/2014 1000  BP: 89/52  Pulse: 79  Temp:   Resp: 27   Filed Vitals:   08/21/2014 0700 08/25/2014 0800 08/29/2014 0815 08/24/2014 1000  BP:  170/78  89/52  Pulse: 86 105 92 79  Temp:  98.2 F (36.8 C)    TempSrc:  Axillary    Resp: 26 33 22 27  Height:      Weight:      SpO2: 96% 87% 94% 98%    General: Pt is tired, can be aroused, not in acute distress, confused Cardiovascular: Irregular  rate and rhythm, no rubs, no gallops Respiratory: Clear to auscultation bilaterally, diminished breath sounds bilaterally worse on the right side Abdominal: Soft, non tender, non distended, bowel sounds +, no guarding Extremities: R AKA   Discharge Instructions  Discharge Instructions    Diet - low sodium heart healthy    Complete by:  As directed      Increase activity slowly    Complete by:  As directed             Medication List    STOP taking these medications        atorvastatin 40 MG tablet  Commonly known as:  LIPITOR     bexarotene 75 MG Caps capsule  Commonly known as:  TARGRETIN     Fish Oil 1000 MG Caps     gabapentin 100 MG capsule  Commonly known as:  NEURONTIN     HYDROcodone-acetaminophen 5-325 MG per tablet  Commonly known as:  NORCO/VICODIN     levocetirizine 5 MG tablet  Commonly known as:  XYZAL     lisinopril 5 MG tablet  Commonly known as:  PRINIVIL,ZESTRIL     nitroGLYCERIN 0.4 MG SL tablet  Commonly known as:  NITROSTAT     PRADAXA 75 MG Caps capsule  Generic drug:  dabigatran      TAKE these medications        acetaminophen 325 MG tablet  Commonly known as:  TYLENOL  Take 650 mg by mouth once.     carvedilol 12.5 MG tablet  Commonly known as:  COREG  Take 1 tablet (12.5 mg total) by mouth 2 (two) times daily with a meal.     diltiazem 120 MG 24 hr capsule  Commonly known as:  CARDIZEM CD  Take 120 mg by mouth daily with breakfast.     famotidine 40 MG tablet  Commonly known as:  PEPCID  Take 40 mg by mouth daily. At 4pm.     folic acid 1 MG tablet  Commonly known as:  FOLVITE  Take 1 mg by mouth daily with breakfast.     furosemide 40 MG tablet  Commonly known as:  LASIX  Take 40 mg by mouth daily as needed for fluid. 1 tab daily as needed for weight gain of 2-3 lbs in 24 hours or 3-5lbs in 1 week.     guaiFENesin 600 MG 12 hr tablet  Commonly known as:  MUCINEX  Take 600 mg by mouth 2 (two) times daily.      imipenem-cilastatin 250 mg in sodium chloride 0.9 % 100 mL  Inject 250 mg into the vein every 8 (eight) hours. Continue taking for three more days, last dose December 17th, 2015.     ipratropium-albuterol 0.5-2.5 (3) MG/3ML Soln  Commonly known as:  DUONEB  Take 3 mLs by nebulization  every 6 (six) hours as needed (for shortness of breath and wheezing).     isosorbide mononitrate 30 MG 24 hr tablet  Commonly known as:  IMDUR  Take 1 tablet (30 mg total) by mouth daily.     polyethylene glycol packet  Commonly known as:  MIRALAX / GLYCOLAX  Take 17 g by mouth every other day. Take 1 packet mixed in 4 oz of beverage every day to relieve constipation.     REFRESH OP  Apply 1 drop to eye 4 (four) times daily. Instill 1 drop to both eyes four times daily.*Wait 3-5 minutes between eye drops*     triamcinolone cream 0.1 %  Commonly known as:  KENALOG  Apply topically as directed.           Follow-up Information    Follow up with GREEN, Viviann Spare, MD.   Specialty:  Internal Medicine   Contact information:   [address] [place] [address] [phone]        The results of significant diagnostics from this hospitalization (including imaging, microbiology, ancillary and laboratory) are listed below for reference.     Microbiology: Recent Results (from the past 240 hour(s))  Culture, Urine     Status: None   Collection Time: 08/17/14 12:28 PM  Result Value Ref Range Status   Specimen Description URINE, RANDOM  Final   Special Requests NONE  Final   Culture  Setup Time   Final    08/17/2014 20:01 Performed at [place] Performed at [facility]   Final   Culture NO GROWTH Performed at [facility]   Final   Report Status 08/18/2014 FINAL  Final  Culture, blood (routine x 2)     Status: None (Preliminary result)   Collection Time: 08/22/14  6:20 PM  Result Value Ref Range Status   Specimen Description BLOOD  RIGHT ARM  Final   Special Requests BOTTLES DRAWN AEROBIC AND ANAEROBIC 10CC  Final   Culture  Setup Time   Final    08/22/2014 23:05 Performed at [facility]    Culture   Final           BLOOD CULTURE RECEIVED NO GROWTH TO DATE CULTURE WILL BE HELD FOR 5 DAYS BEFORE ISSUING A FINAL NEGATIVE REPORT Note: Culture results may be compromised due to an excessive volume of blood received in culture bottles. Performed at [facility]    Report Status PENDING  Incomplete  Culture, blood (routine x 2)     Status: None (Preliminary result)   Collection Time: 08/22/14  6:30 PM  Result Value Ref Range Status   Specimen Description BLOOD LEFT HAND  Final   Special Requests BOTTLES DRAWN AEROBIC AND ANAEROBIC 10CC  Final   Culture  Setup Time   Final    08/22/2014 23:04 Performed at [facility]    Culture   Final           BLOOD CULTURE RECEIVED NO GROWTH TO DATE CULTURE WILL BE HELD FOR 5 DAYS BEFORE ISSUING A FINAL NEGATIVE REPORT Note: Culture results may be compromised due to an excessive volume of blood received in culture bottles. Performed at [facility]    Report Status PENDING  Incomplete  AFB culture with smear     Status: None (Preliminary result)   Collection Time: 08/23/14 11:19 AM  Result Value Ref Range Status   Specimen Description PLEURAL  RIGHT  Final   Special Requests NONE  Final   Acid Fast Smear   Final    NO ACID FAST BACILLI SEEN Performed at [facility]    Culture   Final    CULTURE WILL BE EXAMINED FOR 6 WEEKS BEFORE ISSUING A FINAL REPORT Performed at [facility]    Report Status PENDING  Incomplete  Body fluid culture     Status: None   Collection Time: 08/23/14 11:19 AM  Result Value Ref Range Status   Specimen Description PLEURAL RIGHT  Final   Special Requests NONE  Final   Gram Stain   Final    RARE WBC PRESENT, PREDOMINANTLY MONONUCLEAR NO ORGANISMS SEEN Performed at [facility]     Culture   Final    NO GROWTH 3 DAYS Performed at [facility]    Report Status 09/06/2014 FINAL  Final  Fungus Culture with Smear     Status: None (Preliminary result)   Collection Time: 08/23/14 11:19 AM  Result Value Ref Range Status   Specimen Description PLEURAL RIGHT  Final   Special Requests NONE  Final   Fungal Smear   Final    NO YEAST OR FUNGAL ELEMENTS SEEN Performed at [facility]    Culture   Final    CULTURE IN PROGRESS FOR FOUR WEEKS Performed at [facility]    Report Status PENDING  Incomplete     Labs: Basic Metabolic Panel:  Recent Labs Lab 08/22/14 0352 08/23/14 0330 08/24/14 1158 08/25/14 0344 08/26/14 0335  NA 148* 151* 148* 145 145  K 4.3 4.8 4.5 4.7 5.1  CL 111 114* 113* 111 111  CO2 $Re'25 26 24 24 25  'OhK$ GLUCOSE 113* 115* 127* 118* 143*  BUN 56* 58* 57* 54* 45*  CREATININE 1.20* 1.26* 1.15* 1.20* 0.99  CALCIUM 9.5 9.7 8.7 8.8 8.8   Liver Function Tests:  Recent Labs Lab 08/23/14 0300  PROT 6.6   CBC:  Recent Labs Lab 08/22/14 0352 08/23/14 0330 08/24/14 1158 08/25/14 0344 08/26/14 0335  WBC 23.2* 25.2* 26.5* 29.3* 27.0*  NEUTROABS  --  13.3*  --   --   --   HGB 9.3* 9.9* 9.1* 9.3* 9.5*  HCT 29.9* 32.4* 29.7* 29.7* 31.8*  MCV 96.8 97.9 97.1 98.7 99.7  PLT 266 330 328 333 359    BNP (last 3 results)  Recent Labs  08/30/13 0453 08/16/14 1020  PROBNP 802.3* 19573.0*   CBG:  Recent Labs Lab 08/23/14 0807 08/24/14 0802 08/25/14 0814 08/26/14 0756 09/07/2014 0757  GLUCAP 140* 128* 120* 147* 106*     SIGNED: Time coordinating discharge: Over 30 minutes  [name], MD  [facility] 08/25/2014, 11:55 AM Pager [phone]  If 7PM-7AM, please contact night-coverage [email or web address] Password [email or web address]

## 2014-08-28 ENCOUNTER — Other Ambulatory Visit (HOSPITAL_COMMUNITY): Payer: Self-pay

## 2014-08-28 LAB — CBC WITH DIFFERENTIAL/PLATELET
Basophils Absolute: 0 10*3/uL (ref 0.0–0.1)
Basophils Relative: 0 % (ref 0–1)
EOS PCT: 0 % (ref 0–5)
Eosinophils Absolute: 0 10*3/uL (ref 0.0–0.7)
HEMATOCRIT: 32.9 % — AB (ref 36.0–46.0)
Hemoglobin: 9.6 g/dL — ABNORMAL LOW (ref 12.0–15.0)
LYMPHS ABS: 9 10*3/uL — AB (ref 0.7–4.0)
Lymphocytes Relative: 37 % (ref 12–46)
MCH: 29.1 pg (ref 26.0–34.0)
MCHC: 29.2 g/dL — ABNORMAL LOW (ref 30.0–36.0)
MCV: 99.7 fL (ref 78.0–100.0)
MONO ABS: 1.5 10*3/uL — AB (ref 0.1–1.0)
MONOS PCT: 6 % (ref 3–12)
Neutro Abs: 13.8 10*3/uL — ABNORMAL HIGH (ref 1.7–7.7)
Neutrophils Relative %: 57 % (ref 43–77)
Platelets: 378 10*3/uL (ref 150–400)
RBC: 3.3 MIL/uL — AB (ref 3.87–5.11)
RDW: 13.8 % (ref 11.5–15.5)
WBC: 24.3 10*3/uL — ABNORMAL HIGH (ref 4.0–10.5)

## 2014-08-28 LAB — COMPREHENSIVE METABOLIC PANEL
ALT: 23 U/L (ref 0–35)
ANION GAP: 9 (ref 5–15)
AST: 20 U/L (ref 0–37)
Albumin: 2.2 g/dL — ABNORMAL LOW (ref 3.5–5.2)
Alkaline Phosphatase: 57 U/L (ref 39–117)
BUN: 44 mg/dL — AB (ref 6–23)
CO2: 27 meq/L (ref 19–32)
CREATININE: 0.86 mg/dL (ref 0.50–1.10)
Calcium: 9.1 mg/dL (ref 8.4–10.5)
Chloride: 112 mEq/L (ref 96–112)
GFR calc Af Amer: 65 mL/min — ABNORMAL LOW (ref >90)
GFR, EST NON AFRICAN AMERICAN: 56 mL/min — AB (ref >90)
Glucose, Bld: 102 mg/dL — ABNORMAL HIGH (ref 70–99)
Potassium: 5.3 mEq/L (ref 3.7–5.3)
Sodium: 148 mEq/L — ABNORMAL HIGH (ref 137–147)
Total Bilirubin: <0.2 mg/dL — ABNORMAL LOW (ref 0.3–1.2)
Total Protein: 6.3 g/dL (ref 6.0–8.3)

## 2014-08-28 LAB — CULTURE, BLOOD (ROUTINE X 2)
CULTURE: NO GROWTH
Culture: NO GROWTH

## 2014-08-28 LAB — PHOSPHORUS: Phosphorus: 3.2 mg/dL (ref 2.3–4.6)

## 2014-08-28 LAB — TSH: TSH: 5.17 u[IU]/mL — AB (ref 0.350–4.500)

## 2014-08-28 LAB — MAGNESIUM: MAGNESIUM: 2.7 mg/dL — AB (ref 1.5–2.5)

## 2014-08-28 LAB — T4, FREE: Free T4: 0.83 ng/dL (ref 0.80–1.80)

## 2014-08-28 LAB — PROCALCITONIN: Procalcitonin: 0.1 ng/mL

## 2014-08-28 LAB — VITAMIN B12: Vitamin B-12: 639 pg/mL (ref 211–911)

## 2014-08-29 ENCOUNTER — Other Ambulatory Visit (HOSPITAL_COMMUNITY): Payer: Self-pay

## 2014-08-29 LAB — CBC
HEMATOCRIT: 34.2 % — AB (ref 36.0–46.0)
HEMOGLOBIN: 9.8 g/dL — AB (ref 12.0–15.0)
MCH: 28.9 pg (ref 26.0–34.0)
MCHC: 28.7 g/dL — AB (ref 30.0–36.0)
MCV: 100.9 fL — AB (ref 78.0–100.0)
Platelets: 383 10*3/uL (ref 150–400)
RBC: 3.39 MIL/uL — AB (ref 3.87–5.11)
RDW: 13.6 % (ref 11.5–15.5)
WBC: 18.8 10*3/uL — ABNORMAL HIGH (ref 4.0–10.5)

## 2014-08-29 LAB — BASIC METABOLIC PANEL
Anion gap: 10 (ref 5–15)
BUN: 48 mg/dL — AB (ref 6–23)
CALCIUM: 8.7 mg/dL (ref 8.4–10.5)
CO2: 25 meq/L (ref 19–32)
CREATININE: 0.87 mg/dL (ref 0.50–1.10)
Chloride: 113 mEq/L — ABNORMAL HIGH (ref 96–112)
GFR calc Af Amer: 65 mL/min — ABNORMAL LOW (ref >90)
GFR, EST NON AFRICAN AMERICAN: 56 mL/min — AB (ref >90)
GLUCOSE: 102 mg/dL — AB (ref 70–99)
Potassium: 5.8 mEq/L — ABNORMAL HIGH (ref 3.7–5.3)
Sodium: 148 mEq/L — ABNORMAL HIGH (ref 137–147)

## 2014-08-29 LAB — FOLATE RBC: RBC Folate: >1000 ng/mL — ABNORMAL HIGH

## 2014-08-29 LAB — PATHOLOGIST SMEAR REVIEW

## 2014-08-29 MED ORDER — IOHEXOL 300 MG/ML  SOLN
50.0000 mL | Freq: Once | INTRAMUSCULAR | Status: AC | PRN
  Administered 2014-08-29: 30 mL

## 2014-08-30 ENCOUNTER — Other Ambulatory Visit (HOSPITAL_COMMUNITY): Payer: Medicare Other

## 2014-08-30 ENCOUNTER — Other Ambulatory Visit (HOSPITAL_COMMUNITY): Payer: Self-pay

## 2014-08-30 LAB — BASIC METABOLIC PANEL
Anion gap: 11 (ref 5–15)
BUN: 63 mg/dL — AB (ref 6–23)
CHLORIDE: 111 meq/L (ref 96–112)
CO2: 27 meq/L (ref 19–32)
Calcium: 8.5 mg/dL (ref 8.4–10.5)
Creatinine, Ser: 1.1 mg/dL (ref 0.50–1.10)
GFR calc Af Amer: 49 mL/min — ABNORMAL LOW (ref >90)
GFR calc non Af Amer: 42 mL/min — ABNORMAL LOW (ref >90)
Glucose, Bld: 112 mg/dL — ABNORMAL HIGH (ref 70–99)
Potassium: 5 mEq/L (ref 3.7–5.3)
Sodium: 149 mEq/L — ABNORMAL HIGH (ref 137–147)

## 2014-08-30 LAB — CBC WITH DIFFERENTIAL/PLATELET
BASOS ABS: 0 10*3/uL (ref 0.0–0.1)
BASOS PCT: 0 % (ref 0–1)
Eosinophils Absolute: 0 10*3/uL (ref 0.0–0.7)
Eosinophils Relative: 0 % (ref 0–5)
HCT: 34 % — ABNORMAL LOW (ref 36.0–46.0)
HEMOGLOBIN: 10.2 g/dL — AB (ref 12.0–15.0)
LYMPHS PCT: 26 % (ref 12–46)
Lymphs Abs: 4.8 10*3/uL — ABNORMAL HIGH (ref 0.7–4.0)
MCH: 30.4 pg (ref 26.0–34.0)
MCHC: 30 g/dL (ref 30.0–36.0)
MCV: 101.2 fL — AB (ref 78.0–100.0)
MONO ABS: 1.7 10*3/uL — AB (ref 0.1–1.0)
MONOS PCT: 9 % (ref 3–12)
NEUTROS ABS: 12.1 10*3/uL — AB (ref 1.7–7.7)
Neutrophils Relative %: 65 % (ref 43–77)
Platelets: 365 10*3/uL (ref 150–400)
RBC: 3.36 MIL/uL — ABNORMAL LOW (ref 3.87–5.11)
RDW: 13.8 % (ref 11.5–15.5)
WBC: 18.7 10*3/uL — ABNORMAL HIGH (ref 4.0–10.5)

## 2014-08-30 LAB — PHOSPHORUS: Phosphorus: 5.5 mg/dL — ABNORMAL HIGH (ref 2.3–4.6)

## 2014-08-30 LAB — MAGNESIUM: Magnesium: 2.6 mg/dL — ABNORMAL HIGH (ref 1.5–2.5)

## 2014-09-01 MED FILL — Medication: Qty: 1 | Status: AC

## 2014-09-03 LAB — CULTURE, BLOOD (ROUTINE X 2)
Culture: NO GROWTH
Culture: NO GROWTH

## 2014-09-13 NOTE — Code Documentation (Signed)
CODE BLUE NOTE  Patient Name: Sara [name]   MRN: [id number]   Date of Birth/ Sex: [birth year] , female      Admission Date: 09/07/2014  Attending Provider: Merton Border, MD  Primary Diagnosis: medically complex    Indication: Pt was in her usual state of health until this AM, when she was noted to be bradycardic then asystole. Code blue was subsequently called. At the time of arrival on scene, ACLS protocol was underway.   Technical Description:  - CPR performance duration:  Not performed  - Was defibrillation or cardioversion used? No   - Was external pacer placed? No  - Was patient intubated pre/post CPR? No    Medications Administered: Y = Yes; Blank = No Amiodarone    Atropine    Calcium    Epinephrine  Y  Lidocaine    Magnesium    Norepinephrine    Phenylephrine    Sodium bicarbonate    Vasopressin    Other     Post CPR evaluation:  - Final Status - Was patient successfully resuscitated ? No   Miscellaneous Information:  - Time of death:  12:38  AM  - Primary team notified?  Yes, Dr Linus Mako  - Family Notified? Deferred to primary team     Sara [name] was presented and lead the code team.   Dimas Chyle, MD   September 22, 2014, 12:59 AM

## 2014-09-13 DEATH — deceased

## 2014-09-21 LAB — FUNGUS CULTURE W SMEAR: FUNGAL SMEAR: NONE SEEN

## 2014-09-26 ENCOUNTER — Ambulatory Visit: Payer: Medicare Other | Admitting: Hematology and Oncology

## 2014-09-26 ENCOUNTER — Other Ambulatory Visit: Payer: Medicare Other

## 2014-10-05 LAB — AFB CULTURE WITH SMEAR (NOT AT ARMC): Acid Fast Smear: NONE SEEN

## 2015-09-14 DEATH — deceased

## 2015-12-23 IMAGING — DX DG CHEST 1V PORT
1 series · 1 of 1 positions shown · non-contrast
Comparison: One-view chest 08/17/2014.

CLINICAL DATA: Shortness of breath.  Dyspnea.

EXAM:
PORTABLE CHEST - 1 VIEW

[chest ap]
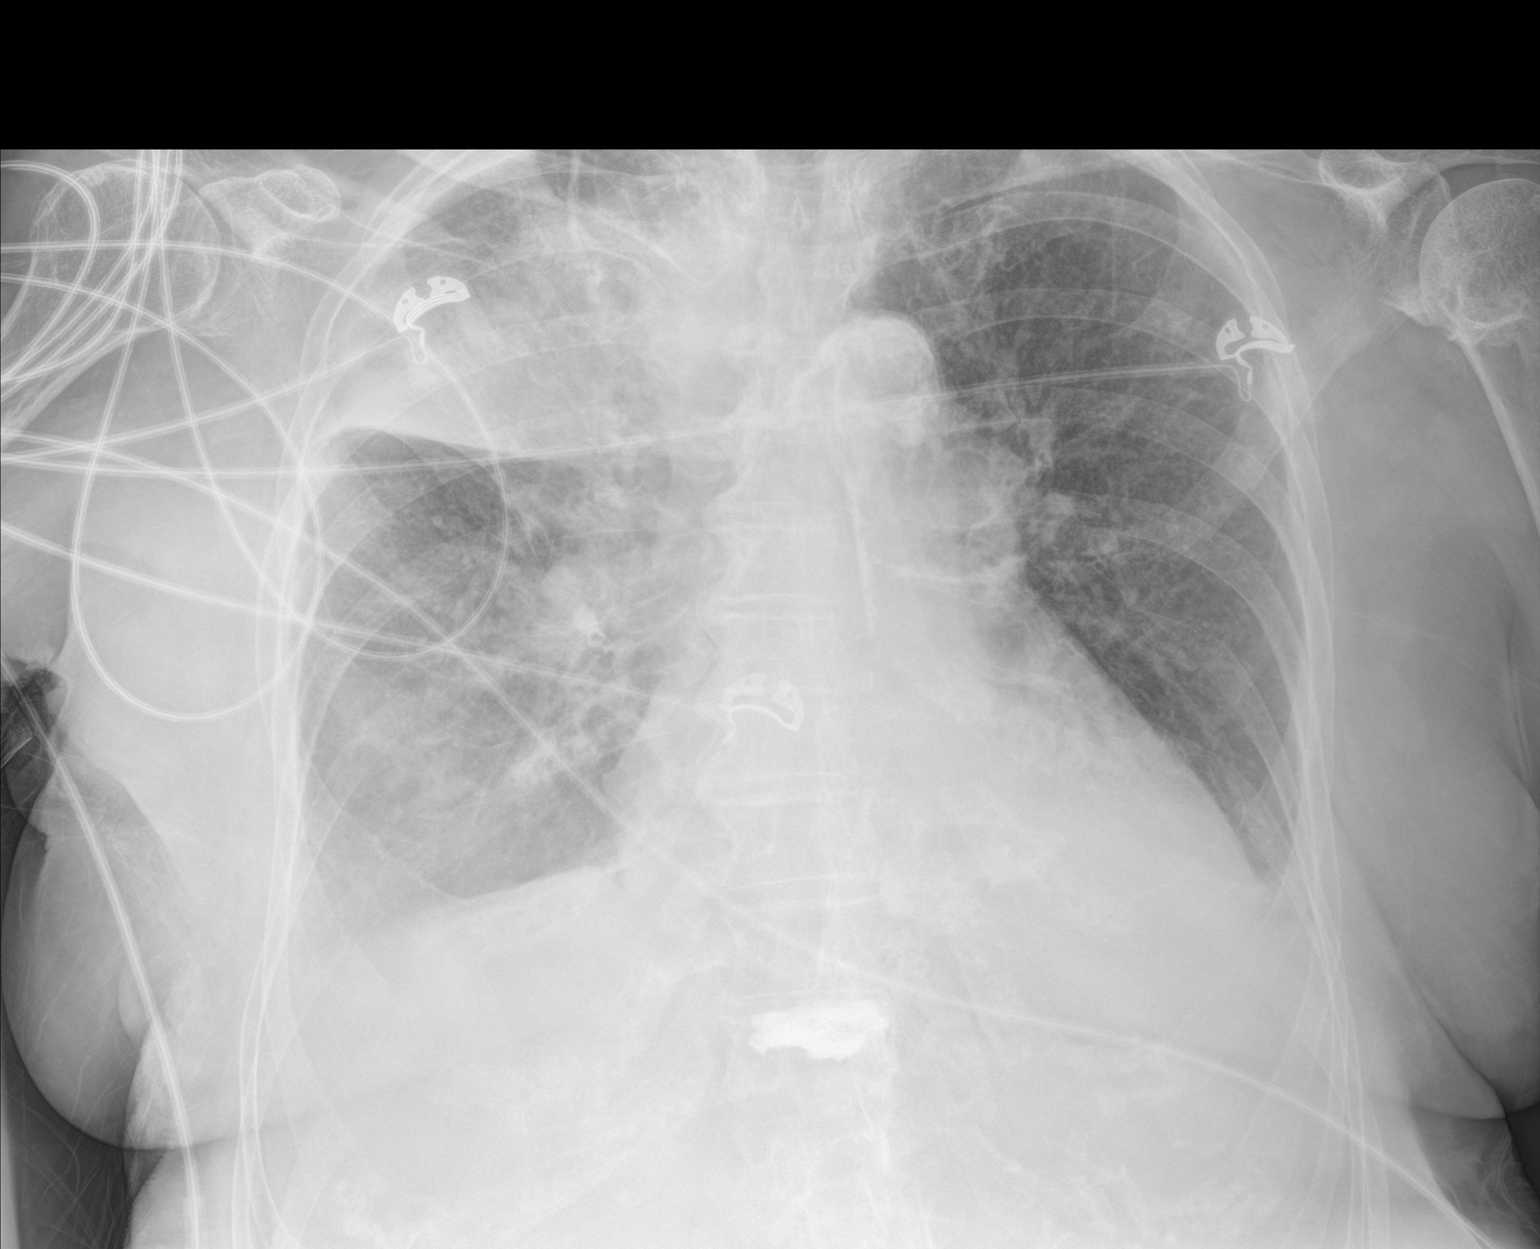

[1 of 1 positions shown; findings below may reference images not displayed]

FINDINGS: The heart is mildly enlarged. The hemidiaphragms are more obscured
bilaterally. Moderate pulmonary vascular congestion is present on
the right. Right upper lobe airspace disease is evident with some
elevation of the minor fissure. Atherosclerotic changes are noted in
the aortic arch.
IMPRESSION: 1. Persistent right upper lobe airspace disease. The persistent
disease raise concern for a central obstructing lesion. If this
persists, CT of the chest with contrast should be considered to
evaluate for an obstructing lesion.
2. Increasing obscuration of the hemidiaphragms bilaterally. This
suggests small bilateral pleural effusions and bibasilar airspace
disease, likely atelectasis.

## 2016-02-21 ENCOUNTER — Other Ambulatory Visit: Payer: Self-pay | Admitting: Nurse Practitioner
